# Patient Record
Sex: Female | Born: 1935 | Race: White | Hispanic: No | Marital: Married | Smoking: Former smoker
Health system: Southern US, Community
[De-identification: ages and names within clinical notes are randomized; demographics above are authoritative.]

## PROBLEM LIST (undated history)

## (undated) DIAGNOSIS — K429 Umbilical hernia without obstruction or gangrene: Secondary | ICD-10-CM

## (undated) DIAGNOSIS — M1990 Unspecified osteoarthritis, unspecified site: Secondary | ICD-10-CM

## (undated) DIAGNOSIS — E164 Increased secretion of gastrin: Secondary | ICD-10-CM

## (undated) DIAGNOSIS — Z8719 Personal history of other diseases of the digestive system: Secondary | ICD-10-CM

## (undated) DIAGNOSIS — E7800 Pure hypercholesterolemia, unspecified: Secondary | ICD-10-CM

## (undated) DIAGNOSIS — E039 Hypothyroidism, unspecified: Secondary | ICD-10-CM

## (undated) DIAGNOSIS — F419 Anxiety disorder, unspecified: Secondary | ICD-10-CM

## (undated) DIAGNOSIS — K589 Irritable bowel syndrome without diarrhea: Secondary | ICD-10-CM

## (undated) DIAGNOSIS — E041 Nontoxic single thyroid nodule: Secondary | ICD-10-CM

## (undated) DIAGNOSIS — C50419 Malignant neoplasm of upper-outer quadrant of unspecified female breast: Secondary | ICD-10-CM

## (undated) DIAGNOSIS — M810 Age-related osteoporosis without current pathological fracture: Secondary | ICD-10-CM

## (undated) DIAGNOSIS — E871 Hypo-osmolality and hyponatremia: Secondary | ICD-10-CM

## (undated) DIAGNOSIS — J189 Pneumonia, unspecified organism: Secondary | ICD-10-CM

## (undated) DIAGNOSIS — K56609 Unspecified intestinal obstruction, unspecified as to partial versus complete obstruction: Secondary | ICD-10-CM

## (undated) DIAGNOSIS — I480 Paroxysmal atrial fibrillation: Secondary | ICD-10-CM

## (undated) DIAGNOSIS — K219 Gastro-esophageal reflux disease without esophagitis: Secondary | ICD-10-CM

## (undated) DIAGNOSIS — E109 Type 1 diabetes mellitus without complications: Secondary | ICD-10-CM

## (undated) DIAGNOSIS — M479 Spondylosis, unspecified: Secondary | ICD-10-CM

## (undated) DIAGNOSIS — I10 Essential (primary) hypertension: Secondary | ICD-10-CM

## (undated) HISTORY — PX: BREAST LUMPECTOMY: SHX2

## (undated) HISTORY — DX: Unspecified osteoarthritis, unspecified site: M19.90

## (undated) HISTORY — DX: Personal history of other diseases of the digestive system: Z87.19

## (undated) HISTORY — PX: APPENDECTOMY: SHX54

## (undated) HISTORY — PX: BLADDER REPAIR: SHX76

## (undated) HISTORY — DX: Increased secretion of gastrin: E16.4

## (undated) HISTORY — DX: Nontoxic single thyroid nodule: E04.1

## (undated) HISTORY — DX: Malignant neoplasm of upper-outer quadrant of unspecified female breast: C50.419

## (undated) HISTORY — DX: Irritable bowel syndrome, unspecified: K58.9

## (undated) HISTORY — DX: Pneumonia, unspecified organism: J18.9

## (undated) HISTORY — PX: ABDOMINAL HYSTERECTOMY: SHX81

## (undated) HISTORY — PX: OTHER SURGICAL HISTORY: SHX169

## (undated) HISTORY — DX: Umbilical hernia without obstruction or gangrene: K42.9

## (undated) HISTORY — DX: Hypothyroidism, unspecified: E03.9

## (undated) HISTORY — PX: TONSILLECTOMY: SUR1361

## (undated) HISTORY — PX: BREAST SURGERY: SHX581

## (undated) HISTORY — DX: Pure hypercholesterolemia, unspecified: E78.00

## (undated) HISTORY — DX: Anxiety disorder, unspecified: F41.9

## (undated) HISTORY — DX: Unspecified intestinal obstruction, unspecified as to partial versus complete obstruction: K56.609

## (undated) HISTORY — DX: Type 1 diabetes mellitus without complications: E10.9

## (undated) HISTORY — DX: Age-related osteoporosis without current pathological fracture: M81.0

---

## 1997-11-21 ENCOUNTER — Other Ambulatory Visit: Admission: RE | Admit: 1997-11-21 | Discharge: 1997-11-21 | Payer: Self-pay | Admitting: Gynecology

## 1998-12-24 ENCOUNTER — Other Ambulatory Visit: Admission: RE | Admit: 1998-12-24 | Discharge: 1998-12-24 | Payer: Self-pay | Admitting: Gynecology

## 1999-01-21 ENCOUNTER — Ambulatory Visit (HOSPITAL_COMMUNITY): Admission: RE | Admit: 1999-01-21 | Discharge: 1999-01-21 | Payer: Self-pay

## 1999-12-23 ENCOUNTER — Other Ambulatory Visit: Admission: RE | Admit: 1999-12-23 | Discharge: 1999-12-23 | Payer: Self-pay | Admitting: Gynecology

## 2000-01-26 ENCOUNTER — Encounter: Admission: RE | Admit: 2000-01-26 | Discharge: 2000-01-26 | Payer: Self-pay | Admitting: Gynecology

## 2000-01-26 ENCOUNTER — Encounter: Payer: Self-pay | Admitting: Gynecology

## 2000-02-02 ENCOUNTER — Encounter: Payer: Self-pay | Admitting: Gynecology

## 2000-02-02 ENCOUNTER — Encounter: Admission: RE | Admit: 2000-02-02 | Discharge: 2000-02-02 | Payer: Self-pay | Admitting: Gynecology

## 2001-01-12 ENCOUNTER — Other Ambulatory Visit: Admission: RE | Admit: 2001-01-12 | Discharge: 2001-01-12 | Payer: Self-pay | Admitting: Gynecology

## 2001-02-28 ENCOUNTER — Encounter: Admission: RE | Admit: 2001-02-28 | Discharge: 2001-02-28 | Payer: Self-pay | Admitting: Gynecology

## 2001-02-28 ENCOUNTER — Encounter: Payer: Self-pay | Admitting: Gynecology

## 2002-01-16 ENCOUNTER — Other Ambulatory Visit: Admission: RE | Admit: 2002-01-16 | Discharge: 2002-01-16 | Payer: Self-pay | Admitting: Gynecology

## 2002-03-08 ENCOUNTER — Encounter: Payer: Self-pay | Admitting: Gynecology

## 2002-03-08 ENCOUNTER — Encounter: Admission: RE | Admit: 2002-03-08 | Discharge: 2002-03-08 | Payer: Self-pay | Admitting: Gynecology

## 2003-02-13 ENCOUNTER — Other Ambulatory Visit: Admission: RE | Admit: 2003-02-13 | Discharge: 2003-02-13 | Payer: Self-pay | Admitting: Gynecology

## 2003-04-24 ENCOUNTER — Encounter: Admission: RE | Admit: 2003-04-24 | Discharge: 2003-04-24 | Payer: Self-pay | Admitting: Gynecology

## 2003-04-24 ENCOUNTER — Encounter: Payer: Self-pay | Admitting: Gynecology

## 2003-11-07 ENCOUNTER — Inpatient Hospital Stay (HOSPITAL_COMMUNITY): Admission: RE | Admit: 2003-11-07 | Discharge: 2003-11-08 | Payer: Self-pay | Admitting: Gynecology

## 2004-02-25 ENCOUNTER — Other Ambulatory Visit: Admission: RE | Admit: 2004-02-25 | Discharge: 2004-02-25 | Payer: Self-pay | Admitting: Gynecology

## 2004-04-28 ENCOUNTER — Encounter: Admission: RE | Admit: 2004-04-28 | Discharge: 2004-04-28 | Payer: Self-pay | Admitting: Gynecology

## 2004-05-01 ENCOUNTER — Encounter: Admission: RE | Admit: 2004-05-01 | Discharge: 2004-05-01 | Payer: Self-pay | Admitting: Gynecology

## 2005-05-07 ENCOUNTER — Encounter: Admission: RE | Admit: 2005-05-07 | Discharge: 2005-05-07 | Payer: Self-pay | Admitting: Gynecology

## 2006-03-03 ENCOUNTER — Other Ambulatory Visit: Admission: RE | Admit: 2006-03-03 | Discharge: 2006-03-03 | Payer: Self-pay | Admitting: Gynecology

## 2006-05-17 ENCOUNTER — Encounter: Admission: RE | Admit: 2006-05-17 | Discharge: 2006-05-17 | Payer: Self-pay | Admitting: Gynecology

## 2007-02-23 ENCOUNTER — Ambulatory Visit: Payer: Self-pay | Admitting: Internal Medicine

## 2007-02-23 ENCOUNTER — Inpatient Hospital Stay (HOSPITAL_COMMUNITY): Admission: EM | Admit: 2007-02-23 | Discharge: 2007-02-24 | Payer: Self-pay | Admitting: Emergency Medicine

## 2007-02-23 ENCOUNTER — Encounter (INDEPENDENT_AMBULATORY_CARE_PROVIDER_SITE_OTHER): Payer: Self-pay | Admitting: Internal Medicine

## 2007-02-23 ENCOUNTER — Ambulatory Visit: Payer: Self-pay | Admitting: Cardiology

## 2007-05-30 ENCOUNTER — Encounter: Admission: RE | Admit: 2007-05-30 | Discharge: 2007-05-30 | Payer: Self-pay | Admitting: Gynecology

## 2008-06-18 ENCOUNTER — Encounter: Admission: RE | Admit: 2008-06-18 | Discharge: 2008-06-18 | Payer: Self-pay | Admitting: Gynecology

## 2009-07-01 ENCOUNTER — Encounter: Admission: RE | Admit: 2009-07-01 | Discharge: 2009-07-01 | Payer: Self-pay | Admitting: Gynecology

## 2010-07-14 ENCOUNTER — Encounter: Admission: RE | Admit: 2010-07-14 | Discharge: 2010-07-14 | Payer: Self-pay | Admitting: Gynecology

## 2010-07-17 HISTORY — PX: ESOPHAGOGASTRODUODENOSCOPY: SHX1529

## 2010-07-24 ENCOUNTER — Encounter
Admission: RE | Admit: 2010-07-24 | Discharge: 2010-07-24 | Payer: Self-pay | Source: Home / Self Care | Attending: Gynecology | Admitting: Gynecology

## 2010-08-10 LAB — HM PAP SMEAR: HM Pap smear: NORMAL

## 2010-08-31 ENCOUNTER — Encounter: Payer: Self-pay | Admitting: Gynecology

## 2010-12-23 NOTE — Discharge Summary (Signed)
[name], [name]                ACCOUNT NO.:  [id number]   MEDICAL RECORD NO.:  [id number]          PATIENT TYPE:  INP   LOCATION:  4731                         FACILITY:  [facility]   PHYSICIAN:  Alvester Morin, M.D.  DATE OF BIRTH:  1929-05-03   DATE OF ADMISSION:  02/23/2007  DATE OF DISCHARGE:  02/24/2007                               DISCHARGE SUMMARY   DISCHARGE DIAGNOSES:  1. Chest pain, unknown etiology  2. Atrial fibrillation with rapid ventricular rate, spontaneously      converted  3. Diabetes mellitus type 1.  4. Hypothyroidism.  5. Hyperlipidemia.  6. GI bleed.   MEDICATIONS ON DISCHARGE:  1. Tricor 80 mg PO bid.  2. Insulin Lantus 10 units SQ daily.  3. Sliding scale insulin.  4. Synthroid 0.112 mcg PO bid.  5. Multivitamin.  6. Lunesta 2 mg p.r.n. for sleep.  7. Omeprazole 20 mg PO bid.   DISPOSITION:  She will follow up with cardiologist, Dr. Therisa Doyne,  in [facility] on February 25, 2007, and with her  gastroenterologist, Dr. Alycia Rossetti, in [facility] on March 09, 2007  for her new onset of GI bleed. Her cardiologist will follow her for the  arrythmia.   PROCEDURES:  1. CT angio of the chest and abdomen.  Chest negative for aortic      dissection or pulmonary embolus, and borderline mediastinal lymph      nodes.  Abdomen had no evidence of aortic dissection.   CONSULTATIONS:  None.   HISTORY OF PRESENT ILLNESS:  Ms Bertoni is a 77 year old old lady with a  past medical history of type 1 diabetes mellitus, hyperlipidemia,  history of palpitations, non-smoker, non-obese, and nonhypertensive,  came with sudden chest pain that awakened her from sleep.  The pain was  5/10 in intensity, pressure-like and radiated to the back.  No  aggravating or relieving factor.  No nausea or vomiting, but some  palpitation present.  No indigestion or acid reflux.  No dizziness.  No  sweating.  The only other significant history was an episode of  hypoglycemia with blood glucose of 45 the night prior to admission.  In  the ED, she was found to be in atrial fibrillation with rapid  ventricular rate with a maximum heart rate of 130 spontaneously  converted to sinus rhythm in the ED.   EXAMINATION:  VITALS:  Temperature 98.5, blood pressure right arm 121/35, left arm  149/73, pulse 131, respirations 35, saturation 100 on room air.  GENERAL EXAM:  No distress.  EYES:  PERRLA.  Extraocular muscle movement intact.  OROPHARYNX:  Moist.  No masses.  No JVD.  No bruits.  CHEST:  Clear to auscultation bilaterally.  No rales or wheezes.  Good  air entry.  No chest tenderness.  HEART:  First and second heart sounds normal.  Regular rate and rhythm.  No rubs or gallops.  Pulses good volume in all 4 limbs.  ABDOMEN:  Bowels are normal. Soft. Nontender.  No organomegaly.  EXTREMITIES:  No edema, cyanosis, or clubbing.  NEUROLOGICAL:  Grossly normal.  LABS ON ADMISSION:  Cardiac enzymes, 3 sets all normal.  WBC 4.5,  hemoglobin 14.4, platelets 187,000.  Sodium 146, potassium 4.1, chloride  104, bicarb 26.3, BUN 13, creatinine 0.8, glucose 164, bili 0.6, alk  phos 72, SGOT 30, SGPT 20, protein 6.3, albumin 3.2.  EKG normal rate  and normal sinus rhythm.  D-dimer 0.26.  Lipase 11, magnesium 1.9.  Urinalysis within normal limits.  Hemoglobin A1c 6.7.   HOSPITAL COURSE:  1. Chest pain:  Serial EKG's showed no new changes and serial cardiac      enzymes and troponins were WNL. She had no more episodes of chest      pain.  2. Atrial fibrillation:  She spontaneously converted to sinus rhythm      in the ED. We followed her rhythm in telemetry. She had some      bradycardic episodes with the lowest being 48 but was asymptomatic.      This may be due to Beta bolcker or may be sick sinus syndrome.  3. Diabetes mellitus type 1:  During hospitalization, she had 1      episode of hypoglycemia when her glucose dropped to 35.  Therefore,      her Lantus  was decreased to 3 units a day, and she had no more      episodes of hypoglycemia.  4. Hypothyroidism:  Her TSH was within normal limits.  5. Hyperlipidemia:  We continued statin.   DISCHARGE LABS:  WBC 5.8, hemoglobin 11.7, MCV 91.6, platelets 154,000.  Sodium 136, potassium 4.2, glucose 217, BUN 12, creatinine 0.65,  bilirubin 0.5, alk phos 72, AST 34, ALT 19, total protein 5.8, albumin  6.2, and calcium 9.5.  Last set of cardiac enzymes normal and blood  lipase 19.   VITALS ON DISCHARGE:  Temperature 98, blood pressure 125/75, pulse 80,  respirations 22, oxygen saturations 98 on room air.   CONDITION ON DISCHARGE:  Stable      Jason Coop, MD  Electronically Signed      Alvester Morin, M.D.  Electronically Signed    YP/MEDQ  D:  02/24/2007  T:  02/25/2007  Job:  [id number]   cc:   Therisa Doyne

## 2010-12-26 NOTE — H&P (Signed)
NAME:  [name], [name]                          ACCOUNT NO.:  [id number]   MEDICAL RECORD NO.:  [id number]                   PATIENT TYPE:  INP   LOCATION:  NA                                   FACILITY:  [facility]   PHYSICIAN:  Gretta Cool, M.D.              DATE OF BIRTH:  February 26, 1929   DATE OF ADMISSION:  11/07/2003  DATE OF DISCHARGE:                                HISTORY & PHYSICAL   CHIEF COMPLAINT:  Pelvic support problems.   HISTORY OF PRESENT ILLNESS:  A 77 year old, G6, P3, AB3, with three living  children with history of hysterectomy in [place]. At that procedure she  had multiple complications including ligation of ureters. She also has a  history of bilateral salpingo-oophorectomy  subsequently. She now has severe  pelvic support issues with global relaxation. She has vault descensus and  severe particularly posterior pelvic support problems with detachment of the  perirectal fascia with levator diastasis. She has minor descent of the  anterior vaginal wall, an enormous enterocele and rectocele, and loss of  peritoneal body musculature.  She has had a trial of pessary with a barrier  test and was not incontinent with the pessary in place. She does not  tolerate the pessary, however, and wishes surgical repair. She is using  vaginal estrogen to improve the friability of her vagina. She is now  admitted for definitive therapy.  She has a history of premature atrial  contractions and a recent history of chest pain. She has had a complete  evaluation done at [facility] with stress test times two, with  echocardiogram. She has no evidence of active coronary disease. She has  known carotid artery calcification. She is now on a beta blocker for  cardioprotective effect.   PAST MEDICAL HISTORY:  Usual childhood disease without sequelae. Medical  illness includes hypothyroidism on thyroid replacement. There is a history  of premature atrial contractions and chest  pains. See the recent evaluation  at [facility].   PAST SURGICAL HISTORY:  Abdominal hysterectomy in 1972/12/29 in [place] with  ureteral injury with subsequent reimplantation of ureters, bilateral  salpingo-oophorectomy in Dec 30, 1974 also in [place], breast biopsy times four.   FAMILY HISTORY:  Father died age 87 with massive MI. Mother died in Dec 30, 1987 of  stroke and fractured hip. Two sisters living and well. One brother deceased  with Parkinson disease.   PRESENT MEDICATIONS:  1. Atenolol 25 mg daily.  2. Synthroid 0.88 mg daily.  3. Zocor 10 mg daily.  4. Xanax 0.5 p.r.n. anxiety.   ALLERGIES:  ERYTHROMYCIN and AUGMENTIN.   SOCIAL HISTORY:  The patient is a retired Engineer, civil (consulting). Her husband is a retired  Runner, broadcasting/film/video. They live in their home in [place].   PHYSICAL EXAMINATION:  GENERAL: A well-developed, well-nourished thin white  female.  HEENT:  Pupils equal, react to light and accommodation. Fundi not examined.  Oropharynx is  clear.  NECK: Supple without mass or thyroid enlargement.  CHEST: Clear to auscultation and percussion.  HEART: Regular rhythm without murmur or cardiac enlargement.  BREASTS: Without mass, nodes, or nipple discharge.  ABDOMEN: Soft without mass or organomegaly.  PELVIC EXAM: External genitalia reveals normal female. Vagina clean without  liquid estrogen effect. Her lower vagina is reasonably well preserved. She  has severe level I and II deficits with detachment of the perirectal fascia  posteriorly with grade 3 rectocele and enterocele. She has a small central  fascia weakness and vaginal vault descensus. Rectovaginal exam confirms.  Cervix, uterus, and adnexa surgically absent.  EXTREMITIES: Negative.  NEUROLOGIC: Physiologic.   IMPRESSION:  1. Global pelvic support weakness with grade 3 rectocele and enterocele with     vaginal vault descensus, small anterior cystocele.  2. Hypothyroid, on replacement.  3. Dyslipidemia, on Zocor.  4. History of  chest pain with negative evaluation.  5. Carotid calcifications bilaterally.   PLAN:  Anterior, posterior, and enterocele repairs with vault suspension.                                               Gretta Cool, M.D.    CWL/MEDQ  D:  11/06/2003  T:  11/06/2003  Job:  [id number]   cc:   Dr. Shanon Rosser, [facility]   Redge Gainer, MD  [facility]. [facility]

## 2010-12-26 NOTE — Discharge Summary (Signed)
NAME:  [name], [name]                          ACCOUNT NO.:  [id number]   MEDICAL RECORD NO.:  [id number]                   PATIENT TYPE:  INP   LOCATION:  0448                                 FACILITY:  [facility]   PHYSICIAN:  [name], M.D.              DATE OF BIRTH:  [birth year]   DATE OF ADMISSION:  11/07/2003  DATE OF DISCHARGE:  11/08/2003                                 DISCHARGE SUMMARY   HISTORY OF PRESENT ILLNESS:  [name] is a 77 year old female, gravida 6,  para 3, AB 3, with a history of hysterectomy in [place].  At that time  she had multiple complications including ligation of ureters.  She has a  history of bilateral salpingo-oophorectomy subsequently.  She now presents  with severe pelvic support relaxation.  Upon examination, it is noted that  she has bulk descensus and severe posterior pelvic support with detachment  of the perirectal fascia and levator diastasis.  She has minor descent of  the anterior vaginal wall with an enormous rectocele, enterocele, and loss  of peritoneal body musculature.  She has had a trial of Pessary and was not  incontinent with the Pessary in place, but does not tolerate it well and  wishes to proceed with surgery repair.  She is using vaginal Estrogen to  improve the friability of her tissue.  She is now admitted for definitive  repairs.  It is also noted that she has premature atrial contractions, and  recent history of chest pain.  She has had a complete evaluation done at  [facility] with stress test x2 and echocardiogram.  There is no  evidence of active coronary disease.  She does have a known carotid artery  calcification.  She is on beta blockers for cardio-protective effect.   PHYSICAL EXAMINATION:  CHEST:  Clear to A&P.  HEART:  Regular rate and rhythm without murmur, gallop, or cardiac  enlargement.  ABDOMEN:  Soft without masses, organomegaly.  PELVIC:  External genitalia within normal limits for  female.  Vagina is  clean with adequate estrogen effect.  The lower vagina is reasonably well  preserved.  She has severe level one and two deficits with detachment of the  perirectal fascia posteriorly with grade 3 enterocele and rectocele.  She  has a small central fascial weakness and vaginal vault descensus.  Rectovaginal examination confirms.  Cervix and uterus is surgically absent.   IMPRESSION:  1. Global pelvic weakness with grade 3 rectocele and enterocele with vaginal     vault descent, small anterior cystocele.  2. Hypothyroid, on replacement.  3. Dyslipidemia, on Zocor.  4. History of chest pain with negative evaluation.  5. Carotid calcification bilaterally.   PLAN:  Anterior posterior enterocele repairs with vaginal vault suspension  under general anesthesia.  The procedure was discussed with the patient and  she accepts this treatment.   LABORATORY DATA:  Admission hemoglobin  14.2, hematocrit 41.1.  On the first  postoperative day, hemoglobin was 10.9, hematocrit 32.2.  Glucose on  admission was high at 144.  EKG normal sinus rhythm, septal infarct, age  undetermined, considered an abnormal EKG.  Chest x-ray moderate changes of  COPD and chronic bronchitis, bilateral carotid artery calcifications.   HOSPITAL COURSE:  The patient underwent anterior posterior enterocele  repairs and colposuspension under general anesthesia.  The procedures were  completed without any complications, and the patient was returned to the  recovery room in excellent condition.  Her postoperative course was without  complications, and she was discharged on the first postoperative day in  excellent condition.  Postoperative instructions were given.  No heavy  lifting or straining, no vaginal entrance, increase ambulation as tolerated.  She is to call with any fever of over 100.5 or failure of daily improvement.   DIET:  Regular.   DISCHARGE MEDICATIONS:  Were given to the patient by Dr.  Nicholas Foster for Dilaudid  and Phenergan.   FOLLOWUP:  She is to return to the office in one week for followup.   CONDITION ON DISCHARGE:  Excellent.   FINAL DISCHARGE DIAGNOSIS:  Severe pelvic organ prolapse.   PROCEDURES PERFORMED:  Anterior and posterior enterocele repairs and  colposuspension under general anesthesia.     [name], N.P.                          [name], M.D.    EMK/MEDQ  D:  11/26/2003  T:  11/26/2003  Job:  [id number]   cc:   [name], [place]   [name]  [name]

## 2010-12-26 NOTE — Op Note (Signed)
[name], [name]                          ACCOUNT NO.:  [id number]   MEDICAL RECORD NO.:  [id number]                   PATIENT TYPE:  INP   LOCATION:  0448                                 FACILITY:  [facility]   PHYSICIAN:  Gretta Cool, M.D.              DATE OF BIRTH:  [birth year]   DATE OF PROCEDURE:  11/07/2003  DATE OF DISCHARGE:                                 OPERATIVE REPORT   PREOPERATIVE DIAGNOSIS:  Severe pelvic organ prolapse.   POSTOPERATIVE DIAGNOSIS:  Severe pelvic organ prolapse.   PROCEDURE:  Anterior, posterior and enterocele repairs and colposuspension.   SURGEON:  Gretta Cool, M.D.   ASSISTANT:  Raynald Kemp, M.D.   ANESTHESIA:  General orotracheal.   DESCRIPTION OF PROCEDURE:  Under excellent anesthesia as above with the  patient prepped and draped in the lithotomy position, the vaginal apex was  grasped with Allis clamps and an incision made across the apex of the  fundus.  The mucosa was then dissected from the underlying endocervical  fascia.  The dissection was carried to the sub-urethral area and the mucosa  then reflected by blunt and sharp dissection from the underlying endopelvic  fascia.  A large central fascial defect could be identified with resultant  bulge of the bladder base through the defect.  The defect was initially  closed by pursestring sutures and then with mattress closure of 2-0 Vicryl.  Kelly plication sutures were then placed mid urethra to elevate the vesical  neck.  The bladder base was then secured to the uterosacral cardinal complex  so as to fix the anterior vaginal wall fascia to a high intra-abdominal  position. At this point the mucosa was trimmed and closed with a running  subcuticular closure of 2-0 Vicryl.  At this point, the posterior vaginal  wall was grasped with Allis clamps and infiltrated with Xylocaine with  epinephrine.  The mucosa was then incised and the mucosa dissected to the  apex of the vaginal  cuff.  The mucosa was then reflected off the perirectal  fascia.  The large enterocele defect was identified and plicated.  The  uterosacral cardinal complex was then identified and the vaginal apex  secured to the cardinal uterosacral complex by interrupted sutures of #2-0  Monocryl.  The perirectal fascia was then secured to the cardinal  uterosacral complex with interrupted sutures of 2-0 Novofil.  The upper  portion of the endopelvic fascia and perirectal fascia was then plicated in  the midline so as to complete an envelope of fascia 360 degrees around the  apex of the cuff.  Those sutures were also secured to the uterosacral  cardinal complex.  At this point the remaining defects in the perirectal  fascia were approximated with interrupted running sutures of  #2-0 Vicryl.  The fascial detachment was reapproximated totally at this point.  The  perineal body muscle were then dissected free  and plicated in the midline.  The mucosa was then trimmed and closed with a running 2-0 Vicryl suture  along with the upper layers of endopelvic fascia.  At this point, the  perineal body musculature was completely rebuilt and the mucosa closed with  a subcuticular closure of 2-0 Vicryl.  At this point the bladder was filled  with lactated Ringers and a banana suprapubic Cysto cath placed and secured  with 3-0  nylon.  At this point, the vagina was well supported by digital introitus  with adequate caliber and adequate length.  The procedure was terminated  without complications and the patient returned to the recovery room in  excellent condition.                                               Gretta Cool, M.D.    CWL/MEDQ  D:  11/07/2003  T:  11/07/2003  Job:  [id number]   cc:   DR. Shanon Rosser, [place]   Redge Gainer, M.D.  [facility]  [facility]

## 2011-05-25 LAB — CARDIAC PANEL(CRET KIN+CKTOT+MB+TROPI)
CK, MB: 3
Relative Index: INVALID
Troponin I: 0.01
Troponin I: 0.03

## 2011-05-25 LAB — COMPREHENSIVE METABOLIC PANEL
ALT: 20
AST: 30
Albumin: 3.2 — ABNORMAL LOW
Albumin: 3.4 — ABNORMAL LOW
Alkaline Phosphatase: 72
BUN: 12
Calcium: 9.6
Chloride: 103
Creatinine, Ser: 0.65
GFR calc Af Amer: >60
GFR calc non Af Amer: >60
Glucose, Bld: 170 — ABNORMAL HIGH
Glucose, Bld: 270 — ABNORMAL HIGH
Potassium: 4.4
Sodium: 138
Total Bilirubin: 0.5
Total Protein: 6.3

## 2011-05-25 LAB — LIPASE, BLOOD
Lipase: 17
Lipase: 19

## 2011-05-25 LAB — CBC
HCT: 34.1 — ABNORMAL LOW
HCT: 41.3
Hemoglobin: 13.3
Hemoglobin: 14.4
MCHC: 35.1
MCV: 90
MCV: 90.2
MCV: 91.6
Platelets: 154
RBC: 4.21
RBC: 4.57
RDW: 12.4
WBC: 4.5
WBC: 5.8

## 2011-05-25 LAB — I-STAT 8, (EC8 V) (CONVERTED LAB)
Acid-Base Excess: 5 — ABNORMAL HIGH
Bicarbonate: 26.3 — ABNORMAL HIGH
Hemoglobin: 15.3 — ABNORMAL HIGH
Potassium: 4.1
Sodium: 136
TCO2: 27
pH, Ven: 7.572 — ABNORMAL HIGH

## 2011-05-25 LAB — DIFFERENTIAL
Basophils Relative: 1
Eosinophils Absolute: 0
Eosinophils Absolute: 0.1
Eosinophils Relative: 1
Eosinophils Relative: 2
Lymphocytes Relative: 41
Lymphs Abs: 1.9
Monocytes Absolute: 0.6
Monocytes Relative: 14 — ABNORMAL HIGH
Monocytes Relative: 14 — ABNORMAL HIGH

## 2011-05-25 LAB — URINALYSIS, ROUTINE W REFLEX MICROSCOPIC
Glucose, UA: NEGATIVE
Hgb urine dipstick: NEGATIVE
Specific Gravity, Urine: 1.008

## 2011-05-25 LAB — HEMOGLOBIN A1C: Hgb A1c MFr Bld: 6.7 — ABNORMAL HIGH

## 2011-05-25 LAB — POCT CARDIAC MARKERS
CKMB, poc: 2
CKMB, poc: 3.3
Myoglobin, poc: 50.2
Troponin i, poc: <0.05
Troponin i, poc: <0.05

## 2011-06-16 LAB — HEPATIC FUNCTION PANEL
Alkaline Phosphatase: 83 U/L (ref 25–125)
Bilirubin, Direct: 0.1 mg/dL (ref 0.01–0.4)

## 2011-06-16 LAB — LIPID PANEL
Cholesterol: 179 mg/dL (ref 0–200)
HDL: 64 mg/dL (ref 35–70)
LDL Cholesterol: 104 mg/dL
Triglycerides: 54 mg/dL (ref 40–160)

## 2011-06-16 LAB — BASIC METABOLIC PANEL: BUN: 9 mg/dL (ref 4–21)

## 2011-06-16 LAB — POCT ERYTHROCYTE SEDIMENTATION RATE, NON-AUTOMATED

## 2011-06-22 ENCOUNTER — Other Ambulatory Visit: Payer: Self-pay | Admitting: Gynecology

## 2011-06-22 DIAGNOSIS — R234 Changes in skin texture: Secondary | ICD-10-CM

## 2011-06-29 ENCOUNTER — Ambulatory Visit
Admission: RE | Admit: 2011-06-29 | Discharge: 2011-06-29 | Disposition: A | Discharge status: Home / Self Care | Payer: Medicare Other | Source: Ambulatory Visit | Attending: Gynecology | Admitting: Gynecology

## 2011-06-29 DIAGNOSIS — R234 Changes in skin texture: Secondary | ICD-10-CM

## 2011-07-06 ENCOUNTER — Other Ambulatory Visit: Payer: Self-pay | Admitting: Gynecology

## 2011-07-06 DIAGNOSIS — Z1231 Encounter for screening mammogram for malignant neoplasm of breast: Secondary | ICD-10-CM

## 2011-07-06 DIAGNOSIS — N6489 Other specified disorders of breast: Secondary | ICD-10-CM

## 2011-07-06 DIAGNOSIS — N6453 Retraction of nipple: Secondary | ICD-10-CM

## 2011-07-17 ENCOUNTER — Other Ambulatory Visit: Payer: Medicare Other

## 2011-07-17 ENCOUNTER — Ambulatory Visit: Payer: Medicare Other

## 2011-07-28 ENCOUNTER — Ambulatory Visit
Admission: RE | Admit: 2011-07-28 | Discharge: 2011-07-28 | Disposition: A | Discharge status: Home / Self Care | Payer: Medicare Other | Source: Ambulatory Visit | Attending: Gynecology | Admitting: Gynecology

## 2011-07-28 DIAGNOSIS — N6453 Retraction of nipple: Secondary | ICD-10-CM

## 2011-07-28 DIAGNOSIS — Z1231 Encounter for screening mammogram for malignant neoplasm of breast: Secondary | ICD-10-CM

## 2011-07-28 MED ORDER — GADOBENATE DIMEGLUMINE 529 MG/ML IV SOLN
12.0000 mL | Freq: Once | INTRAVENOUS | Status: AC | PRN
  Administered 2011-07-28: 12 mL via INTRAVENOUS

## 2011-08-05 ENCOUNTER — Other Ambulatory Visit: Payer: Self-pay | Admitting: Gynecology

## 2011-08-05 DIAGNOSIS — R928 Other abnormal and inconclusive findings on diagnostic imaging of breast: Secondary | ICD-10-CM

## 2011-08-10 ENCOUNTER — Other Ambulatory Visit: Payer: Self-pay | Admitting: Gynecology

## 2011-08-10 DIAGNOSIS — R911 Solitary pulmonary nodule: Secondary | ICD-10-CM

## 2011-08-13 ENCOUNTER — Ambulatory Visit
Admission: RE | Admit: 2011-08-13 | Discharge: 2011-08-13 | Disposition: A | Discharge status: Home / Self Care | Payer: Medicare Other | Source: Ambulatory Visit | Attending: Gynecology | Admitting: Gynecology

## 2011-08-13 DIAGNOSIS — R911 Solitary pulmonary nodule: Secondary | ICD-10-CM

## 2011-08-13 MED ORDER — IOHEXOL 300 MG/ML  SOLN
75.0000 mL | Freq: Once | INTRAMUSCULAR | Status: AC | PRN
  Administered 2011-08-13: 75 mL via INTRAVENOUS

## 2011-08-17 ENCOUNTER — Ambulatory Visit
Admission: RE | Admit: 2011-08-17 | Discharge: 2011-08-17 | Disposition: A | Discharge status: Home / Self Care | Payer: Medicare Other | Source: Ambulatory Visit | Attending: Gynecology | Admitting: Gynecology

## 2011-08-17 DIAGNOSIS — R928 Other abnormal and inconclusive findings on diagnostic imaging of breast: Secondary | ICD-10-CM

## 2011-08-17 MED ORDER — GADOBENATE DIMEGLUMINE 529 MG/ML IV SOLN
12.0000 mL | Freq: Once | INTRAVENOUS | Status: AC | PRN
  Administered 2011-08-17: 12 mL via INTRAVENOUS

## 2011-08-21 ENCOUNTER — Other Ambulatory Visit: Payer: Self-pay | Admitting: *Deleted

## 2011-08-21 ENCOUNTER — Telehealth: Payer: Self-pay | Admitting: *Deleted

## 2011-08-21 DIAGNOSIS — C50419 Malignant neoplasm of upper-outer quadrant of unspecified female breast: Secondary | ICD-10-CM

## 2011-08-21 DIAGNOSIS — C50412 Malignant neoplasm of upper-outer quadrant of left female breast: Secondary | ICD-10-CM | POA: Insufficient documentation

## 2011-08-21 HISTORY — DX: Malignant neoplasm of upper-outer quadrant of unspecified female breast: C50.419

## 2011-08-21 NOTE — Telephone Encounter (Signed)
Confirmed [facility] for 08/26/11 at 1215 .  Instructions and contact information given.  

## 2011-08-26 ENCOUNTER — Ambulatory Visit (HOSPITAL_BASED_OUTPATIENT_CLINIC_OR_DEPARTMENT_OTHER): Payer: Medicare Other | Admitting: General Surgery

## 2011-08-26 ENCOUNTER — Other Ambulatory Visit: Payer: Medicare Other | Admitting: Lab

## 2011-08-26 ENCOUNTER — Other Ambulatory Visit (INDEPENDENT_AMBULATORY_CARE_PROVIDER_SITE_OTHER): Payer: Self-pay | Admitting: General Surgery

## 2011-08-26 ENCOUNTER — Ambulatory Visit
Admission: RE | Admit: 2011-08-26 | Discharge: 2011-08-26 | Disposition: A | Discharge status: Home / Self Care | Payer: Medicare Other | Source: Ambulatory Visit | Attending: Radiation Oncology | Admitting: Radiation Oncology

## 2011-08-26 ENCOUNTER — Encounter: Payer: Self-pay | Admitting: Oncology

## 2011-08-26 ENCOUNTER — Ambulatory Visit: Payer: Medicare Other | Attending: General Surgery | Admitting: Physical Therapy

## 2011-08-26 ENCOUNTER — Ambulatory Visit: Payer: Medicare Other

## 2011-08-26 ENCOUNTER — Encounter (INDEPENDENT_AMBULATORY_CARE_PROVIDER_SITE_OTHER): Payer: Self-pay | Admitting: General Surgery

## 2011-08-26 ENCOUNTER — Ambulatory Visit (HOSPITAL_BASED_OUTPATIENT_CLINIC_OR_DEPARTMENT_OTHER): Payer: Medicare Other | Admitting: Oncology

## 2011-08-26 VITALS — BP 184/68 | HR 80 | Temp 98.4°F | Ht 67.0 in | Wt 143.7 lb

## 2011-08-26 DIAGNOSIS — C50119 Malignant neoplasm of central portion of unspecified female breast: Secondary | ICD-10-CM

## 2011-08-26 DIAGNOSIS — C50919 Malignant neoplasm of unspecified site of unspecified female breast: Secondary | ICD-10-CM | POA: Insufficient documentation

## 2011-08-26 DIAGNOSIS — C50419 Malignant neoplasm of upper-outer quadrant of unspecified female breast: Secondary | ICD-10-CM

## 2011-08-26 DIAGNOSIS — M25619 Stiffness of unspecified shoulder, not elsewhere classified: Secondary | ICD-10-CM | POA: Insufficient documentation

## 2011-08-26 DIAGNOSIS — IMO0001 Reserved for inherently not codable concepts without codable children: Secondary | ICD-10-CM | POA: Insufficient documentation

## 2011-08-26 DIAGNOSIS — Z170 Estrogen receptor positive status [ER+]: Secondary | ICD-10-CM

## 2011-08-26 DIAGNOSIS — Z01818 Encounter for other preprocedural examination: Secondary | ICD-10-CM | POA: Insufficient documentation

## 2011-08-26 LAB — CBC WITH DIFFERENTIAL/PLATELET
Eosinophils Absolute: 0.1 10*3/uL (ref 0.0–0.5)
HGB: 12.7 g/dL (ref 11.6–15.9)
MONO#: 0.6 10*3/uL (ref 0.1–0.9)
NEUT#: 3.5 10*3/uL (ref 1.5–6.5)
Platelets: 218 10*3/uL (ref 145–400)
RBC: 4.25 10*6/uL (ref 3.70–5.45)
RDW: 13.6 % (ref 11.2–14.5)
WBC: 5.9 10*3/uL (ref 3.9–10.3)

## 2011-08-26 LAB — COMPREHENSIVE METABOLIC PANEL
Albumin: 3.9 g/dL (ref 3.5–5.2)
CO2: 28 mEq/L (ref 19–32)
Calcium: 10.1 mg/dL (ref 8.4–10.5)
Glucose, Bld: 146 mg/dL — ABNORMAL HIGH (ref 70–99)
Potassium: 4.2 mEq/L (ref 3.5–5.3)
Sodium: 133 mEq/L — ABNORMAL LOW (ref 135–145)
Total Protein: 7.7 g/dL (ref 6.0–8.3)

## 2011-08-26 NOTE — Progress Notes (Signed)
CC:   [name], M.D. [name], M.D. Cheryl Gosling, MD  REFERRING PHYSICIAN:  Gretta Foster, M.D.  DIAGNOSIS:  Invasive mammary carcinoma of Cheryl left breast.  HISTORY OF PRESENT ILLNESS:  [name] is a very pleasant, 78 year old female who is seen out of Cheryl courtesy of Dr. Nicholas Lose as part of Cheryl multidisciplinary breast clinic.  [name], late last year, notice some changes in Cheryl nipple area.  She subsequently saw Dr. Nicholas Lose, who aspirated a cyst.  [name]'s nipple retraction persisted and [name] proceeded to undergo diagnostic left mammogram and left breast ultrasound.  On ultrasound, [name] noted to have a 1.7 x 0.9 x 1.9 cm cyst within Cheryl 12 o'clock position of Cheryl left breast approximately 2 cm from Cheryl nipple area.  On mammography, [name] was noted to have a circumscribed mass within Cheryl central left breast without distortion or suspicious calcifications.  In light of Cheryl nipple retraction, it was recommend that [name] proceed with MRI which showed a 1 x 2 x 1 cm nonmass-like enhancement within Cheryl anteromedial aspect of Cheryl left breast.  This was underlying Cheryl area of Cheryl skin dimpling/nipple retraction area.  There were no other changes noted within either breast or in Cheryl axillary region.  [name] subsequently proceeded to undergo a biopsy of this area which revealed invasive mammary carcinoma.  Cheryl tumor was estrogen receptor-positive at 97% and progesterone receptor-positive at 35%.  Proliferation marker was low at 9%.  There was no HER-2/neu amplification noted.  In light of Cheryl above findings, [name] is now seen as part of Cheryl multidisciplinary breast clinic.  CURRENT MEDICATIONS: 1. Levothyroxine 75 mcg daily. 2. Simvastatin 20 mg daily. 3. Hydrochlorothiazide 12.5 mg daily. 4. [name] is also on multivitamin mineral supplement as well as     citrate and vitamin D3.  MEDICAL HISTORY:  Significant for: 1.  Hypothyroidism. 2. History of hypercholesterolemia. 3. [name] wears an insulin pump for type 1 diabetes mellitus.  PAST SURGICAL HISTORY:  [name]'s surgeries include breast biopsies as well as complete hysterectomy in Cheryl past.  [name] has also had an appendectomy as well as bladder/pelvic floor repair.  SOCIAL HISTORY:  [name] is married and lives with her husband in Cheryl [place] area.  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  [name] has a remote history of tobacco use, quitting smoking approximately 32 years ago.  [name] has occasional alcohol intake.  FAMILY HISTORY:  No family history of breast or ovarian cancer.  [name]'s younger sister had some type of malignancy which was advanced at diagnosis.  REVIEW OF SYSTEMS:  As above, [name] presented with nipple retraction.  She denied any nipple discharge or bleeding.  [name] denies any pain in Cheryl breast prior to diagnosis.  PHYSICAL EXAMINATION:  General Appearance:  This is a very pleasant, 62- year-old female in no acute distress.  She is accompanied by her husband on evaluation today.  Examination of Cheryl neck and supraclavicular region:  No evidence of adenopathy.  Cheryl axillary areas are free of adenopathy.  Examination of Cheryl lungs:  Clear.  Heart:  Regular rhythm and rate.  Examination of Cheryl abdomen:  Soft and nontender with normal bowel sounds.  Examination of Cheryl right breast:  No mass or nipple discharge.  Examination of left breast:  Some bruising centrally within Cheryl breast.  There is apparent hematoma in Cheryl periareolar area at approximately Cheryl  9 o'clock position of Cheryl left breast.  This is estimated to be approximately 2 x 2 cm in size.  There is no obvious dominant mass appreciated in Cheryl breast, nipple discharge, or bleeding. There is some nipple retraction noted when compared to Cheryl right side.  X-RAY STUDIES:  As summarized in Cheryl HPI.  LABORATORY DATA:   Pending.  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 Given Cheryl location of [name]'s tumor, she would require removal of Cheryl nipple-areolar complex for adequate surgical margins.  [name] was offered a potential mastectomy, but [name] would like to keep her breast if at all possible.  Options in Cheryl postoperative setting would be to proceed with adjuvant hormonal therapy, possibly radiation therapy, or a combination of Cheryl two if [name] wished to be very aggressive with her management.  At this point, [name] is undecided which treatment approach she would like to proceed with.  She will be seen in Cheryl postoperative setting before a final determination as to her postoperative course of treatment.    ______________________________ Billie Lade, Ph.D., M.D. JDK/MEDQ  D:  08/26/2011  T:  08/26/2011  Job:  [id number]

## 2011-08-26 NOTE — Assessment & Plan Note (Deleted)
ER

## 2011-08-26 NOTE — Progress Notes (Signed)
Patient ID: [name], female   DOB: 05/08/1929, 78 y.o.   MRN: [id number]  Chief Complaint  Patient presents with  . Other    breast cancer    HPI [name] is a 78 y.o. female.  Referred by Dr. Jeff Hu HPI This is an 78-year-old female who noticed an inverted left nipple. She saw her primary physician Dr. Lomax and had a cyst aspirated that appeared normal. This area persisted and she was referred to the breast center. She underwent a diagnostic left mammogram and ultrasound with the finding of nipple retraction. Following this she underwent an MRI that showed a 1 x 2 x 1 cm non-masslike enhancement within the anterior medial left breast just below her nipple.  There was also multiple right pulmonary nodular opacities. This has since been followed by a chest CT which shows that the right direction decreased in size since 2008. These were recommended follow-up in one year. She then underwent MR biopsy of this subareolar lesion on the left side. Her pathology shows this to be an invasive lobular carcinoma that is estrogen receptor positive and 97%, progesterone receptor positive at 35%, proliferation index is 9% HER-2/neu is not amplified. She comes in today to discuss options. She does not have any other complaints outside of the inversion of her left nipple areolar complex.  Past Medical History  Diagnosis Date  . Thyroid disease     hypothyroidism  . Hypercholesteremia   . Diabetes mellitus     type I- wears insulin pump    Past Surgical History  Procedure Date  . Appendectomy   . Abdominal hysterectomy   . Bladder repair     Family History  Problem Relation Age of Onset  . Cancer Sister     Social History History  Substance Use Topics  . Smoking status: Never Smoker   . Smokeless tobacco: Not on file  . Alcohol Use: No    No Known Allergies  Current Outpatient Prescriptions  Medication Sig Dispense Refill  . calcium citrate (CALCITRATE - DOSED IN MG  ELEMENTAL CALCIUM) 950 MG tablet Take 1 tablet by mouth daily.      . calcium citrate-vitamin D (CITRACAL+D) 315-200 MG-UNIT per tablet Take 1 tablet by mouth 2 (two) times daily.      . hydrochlorothiazide (MICROZIDE) 12.5 MG capsule Take 12.5 mg by mouth daily.      . insulin aspart (NOVOLOG) 100 UNIT/ML injection Inject into the skin 3 (three) times daily before meals.      . levothyroxine (SYNTHROID, LEVOTHROID) 75 MCG tablet Take 75 mcg by mouth daily.      . Multiple Vitamin (MULTIVITAMIN) capsule Take 1 capsule by mouth daily.      . simvastatin (ZOCOR) 20 MG tablet Take 20 mg by mouth every evening.        Review of Systems Review of Systems  Constitutional: Negative for fever, chills and unexpected weight change.  HENT: Positive for sore throat. Negative for hearing loss, congestion, trouble swallowing and voice change.   Eyes: Negative for visual disturbance.  Respiratory: Positive for cough. Negative for wheezing.   Cardiovascular: Positive for palpitations. Negative for chest pain and leg swelling.  Gastrointestinal: Negative for nausea, vomiting, abdominal pain, diarrhea, constipation, blood in stool, abdominal distention and anal bleeding.  Genitourinary: Negative for hematuria, vaginal bleeding and difficulty urinating.  Musculoskeletal: Positive for arthralgias.  Skin: Negative for rash and wound.  Neurological: Negative for seizures, syncope and headaches.  Hematological: Negative   for adenopathy. Bruises/bleeds easily.  Psychiatric/Behavioral: Negative for confusion. The patient is nervous/anxious.     Wt Readings from Last 3 Encounters:  08/26/11 143 lb 11.2 oz (65.182 kg)   Temp Readings from Last 3 Encounters:  08/26/11 98.4 F (36.9 C) Oral   BP Readings from Last 3 Encounters:  08/26/11 184/68   Pulse Readings from Last 3 Encounters:  08/26/11 80    Physical Exam Physical Exam  Constitutional: She appears well-developed and well-nourished.  Eyes: No  scleral icterus.  Neck: Neck supple.  Cardiovascular: Normal rate, regular rhythm and normal heart sounds.   Pulmonary/Chest: Effort normal and breath sounds normal. She has no wheezes. She has no rales. Right breast exhibits no inverted nipple, no mass, no nipple discharge, no skin change and no tenderness. Left breast exhibits inverted nipple (inferior nipple areolar complext inverted). Left breast exhibits no mass, no nipple discharge, no skin change and no tenderness. Breasts are symmetrical.  Abdominal: Soft. There is no hepatomegaly.  Lymphadenopathy:    She has no cervical adenopathy.    She has no axillary adenopathy.       Right: No supraclavicular adenopathy present.       Left: No supraclavicular adenopathy present.    Data Reviewed BILATERAL BREAST MRI WITH AND WITHOUT CONTRAST  Technique: Multiplanar, multisequence MR images of both breasts  were obtained prior to and following the intravenous administration  of 12ml of Multihance. Three dimensional images were evaluated at  the independent DynaCad workstation.  Comparison: Recent mamm[facility] and [facility].  Findings: Mild to moderate normal background parenchymal  enhancement is identified.  An area of nipple/skin indentation is identified along the medial  left nipple.  5 mm deep to this indentation lies an area of non mass-like  enhancement measuring 1 x 2 x 1 cm (transverse X AP X CC). This  area exhibits persistent type of enhancement kinetics.  No other abnormal areas of enhancement are noted within either  breast.  No enlarged or abnormal-appearing lymph nodes are present.  Small scattered cysts within both breasts are noted.  No bony or upper hepatic abnormalities are identified.  There are multiple right pulmonary nodular opacities identified and  chest CT with contrast is recommended for further evaluation.  IMPRESSION:  1 x 2 x 1 cm non mass-like enhancement within the anterior medial   left breast, underlying the area of skin dimpling/nipple  retraction. This is indeterminate and tissue sampling / MR guided  biopsy is recommended for further evaluation.  Multiple right pulmonary nodular opacities. Metastatic disease is  not excluded and chest CT with contrast is recommended for further  evaluation.   Assessment    Clinical stage I left breast cancer    Plan        We discussed the staging and pathophysiology of breast cancer. We discussed all of the different options for treatment for breast cancer including surgery, chemotherapy, radiation therapy, Herceptin, and antiestrogen therapy.   We discussed a sentinel lymph node biopsy as she does not appear to having lymph node involvement right now. I think even with her age this is reasonable due to her lack of medical problems, activity level and possibility of changing therapy. We discussed the performance of that with injection of radioactive tracer and blue dye. We discussed that she would have an incision underneath her axillary hairline. We discussed that there is a bout a 10-20% chance of having a positive node with a sentinel lymph node   biopsy and we will await the permanent pathology to make any other first further decisions in terms of her treatment. One of these options might be to return to the operating room to perform an axillary lymph node dissection. We discussed about a 1-2% risk lifetime of chronic shoulder pain as well as lymphedema associated with a sentinel lymph node biopsy.  We discussed the options for treatment of the breast cancer which included lumpectomy versus a mastectomy. We discussed the performance of the lumpectomy with a wire placement. We discussed a 10-20% chance of a positive margin requiring reexcision in the operating room. We also discussed that she may need radiation therapy or antiestrogen therapy or both if she undergoes lumpectomy. We discussed the mastectomy and the postoperative care  for that as well. We discussed that there is no difference in her survival whether she undergoes lumpectomy with radiation therapy or antiestrogen therapy versus a mastectomy. There is a slight difference in the local recurrence rate being 3-5% with lumpectomy and about 1% with a mastectomy. I did tell her that this would be a wire guided central lumpectomy and would have her nipple areolar complex removed. We discussed the risks of operation including bleeding, infection, possible reoperation. She understands her further therapy will be based on what her stages at the time of her operation.      [name] 08/26/2011, 3:50 PM    

## 2011-08-27 ENCOUNTER — Other Ambulatory Visit (INDEPENDENT_AMBULATORY_CARE_PROVIDER_SITE_OTHER): Payer: Self-pay | Admitting: General Surgery

## 2011-08-27 DIAGNOSIS — C50119 Malignant neoplasm of central portion of unspecified female breast: Secondary | ICD-10-CM

## 2011-08-28 ENCOUNTER — Encounter (HOSPITAL_BASED_OUTPATIENT_CLINIC_OR_DEPARTMENT_OTHER): Payer: Self-pay | Admitting: *Deleted

## 2011-08-28 NOTE — Progress Notes (Signed)
Pt diabetic-pump-very good about caring for it- Will come in for ekg-bmet- Called cardiology for notes-has palpatations Very upset if teeth have to come out-does not want dr or family to see her without teeth.

## 2011-08-31 ENCOUNTER — Encounter (HOSPITAL_BASED_OUTPATIENT_CLINIC_OR_DEPARTMENT_OTHER)
Admission: RE | Admit: 2011-08-31 | Discharge: 2011-08-31 | Disposition: A | Discharge status: Home / Self Care | Payer: Medicare Other | Source: Ambulatory Visit | Attending: General Surgery | Admitting: General Surgery

## 2011-08-31 ENCOUNTER — Other Ambulatory Visit: Payer: Self-pay

## 2011-08-31 LAB — BASIC METABOLIC PANEL
BUN: 14 mg/dL (ref 6–23)
GFR calc Af Amer: >90 mL/min (ref >90)
GFR calc non Af Amer: 83 mL/min — ABNORMAL LOW (ref >90)
Potassium: 4.6 mEq/L (ref 3.5–5.1)
Sodium: 130 mEq/L — ABNORMAL LOW (ref 135–145)

## 2011-09-01 ENCOUNTER — Telehealth: Payer: Self-pay | Admitting: *Deleted

## 2011-09-01 NOTE — Telephone Encounter (Signed)
Spoke to pt regarding [facility] on 08/26/11.  Pt denies questions or concerns at this time.  Pt still undecided on radiation or anti-estrogen oral therapy.  Pt relate she will let me know by Friday.  Encourage pt to call with needs.  Received verbal understanding.  Contact information given.  Pt is scheduled for surgery on 09/02/11 with f/u with Dr. Welton Flakes on 2/15.

## 2011-09-02 ENCOUNTER — Ambulatory Visit (HOSPITAL_BASED_OUTPATIENT_CLINIC_OR_DEPARTMENT_OTHER): Payer: Medicare Other | Admitting: Anesthesiology

## 2011-09-02 ENCOUNTER — Encounter (HOSPITAL_BASED_OUTPATIENT_CLINIC_OR_DEPARTMENT_OTHER): Payer: Self-pay | Admitting: Anesthesiology

## 2011-09-02 ENCOUNTER — Ambulatory Visit (HOSPITAL_BASED_OUTPATIENT_CLINIC_OR_DEPARTMENT_OTHER)
Admission: RE | Admit: 2011-09-02 | Discharge: 2011-09-02 | Disposition: A | Discharge status: Home / Self Care | Payer: Medicare Other | Source: Ambulatory Visit | Attending: General Surgery | Admitting: General Surgery

## 2011-09-02 ENCOUNTER — Encounter (HOSPITAL_BASED_OUTPATIENT_CLINIC_OR_DEPARTMENT_OTHER)
Admission: RE | Disposition: A | Discharge status: Home / Self Care | Payer: Self-pay | Source: Ambulatory Visit | Attending: General Surgery

## 2011-09-02 ENCOUNTER — Other Ambulatory Visit (INDEPENDENT_AMBULATORY_CARE_PROVIDER_SITE_OTHER): Payer: Self-pay | Admitting: General Surgery

## 2011-09-02 ENCOUNTER — Encounter: Payer: Self-pay | Admitting: *Deleted

## 2011-09-02 ENCOUNTER — Ambulatory Visit
Admission: RE | Admit: 2011-09-02 | Discharge: 2011-09-02 | Disposition: A | Discharge status: Home / Self Care | Payer: Medicare Other | Source: Ambulatory Visit | Attending: General Surgery | Admitting: General Surgery

## 2011-09-02 ENCOUNTER — Ambulatory Visit (HOSPITAL_COMMUNITY)
Admission: RE | Admit: 2011-09-02 | Discharge: 2011-09-02 | Disposition: A | Discharge status: Home / Self Care | Payer: Medicare Other | Source: Ambulatory Visit | Attending: General Surgery | Admitting: General Surgery

## 2011-09-02 ENCOUNTER — Encounter (HOSPITAL_BASED_OUTPATIENT_CLINIC_OR_DEPARTMENT_OTHER): Payer: Self-pay | Admitting: *Deleted

## 2011-09-02 DIAGNOSIS — Z794 Long term (current) use of insulin: Secondary | ICD-10-CM | POA: Insufficient documentation

## 2011-09-02 DIAGNOSIS — D0590 Unspecified type of carcinoma in situ of unspecified breast: Secondary | ICD-10-CM | POA: Insufficient documentation

## 2011-09-02 DIAGNOSIS — I10 Essential (primary) hypertension: Secondary | ICD-10-CM | POA: Insufficient documentation

## 2011-09-02 DIAGNOSIS — C50119 Malignant neoplasm of central portion of unspecified female breast: Secondary | ICD-10-CM

## 2011-09-02 DIAGNOSIS — E109 Type 1 diabetes mellitus without complications: Secondary | ICD-10-CM | POA: Insufficient documentation

## 2011-09-02 DIAGNOSIS — C50019 Malignant neoplasm of nipple and areola, unspecified female breast: Secondary | ICD-10-CM | POA: Insufficient documentation

## 2011-09-02 DIAGNOSIS — C50919 Malignant neoplasm of unspecified site of unspecified female breast: Secondary | ICD-10-CM

## 2011-09-02 DIAGNOSIS — Z01810 Encounter for preprocedural cardiovascular examination: Secondary | ICD-10-CM | POA: Insufficient documentation

## 2011-09-02 HISTORY — DX: Hypothyroidism, unspecified: E03.9

## 2011-09-02 HISTORY — PX: BREAST SURGERY: SHX581

## 2011-09-02 HISTORY — DX: Essential (primary) hypertension: I10

## 2011-09-02 SURGERY — BREAST LUMPECTOMY WITH SENTINEL LYMPH NODE BX
Anesthesia: General | Laterality: Left | Wound class: Clean

## 2011-09-02 MED ORDER — MIDAZOLAM HCL 2 MG/2ML IJ SOLN
0.5000 mg | INTRAMUSCULAR | Status: DC | PRN
  Administered 2011-09-02: 2 mg via INTRAVENOUS

## 2011-09-02 MED ORDER — FENTANYL CITRATE 0.05 MG/ML IJ SOLN
25.0000 ug | INTRAMUSCULAR | Status: DC | PRN
  Administered 2011-09-02: 50 ug via INTRAVENOUS
  Administered 2011-09-02 (×2): 25 ug via INTRAVENOUS

## 2011-09-02 MED ORDER — ACETAMINOPHEN 10 MG/ML IV SOLN
1000.0000 mg | Freq: Once | INTRAVENOUS | Status: AC
  Administered 2011-09-02: 1000 mg via INTRAVENOUS

## 2011-09-02 MED ORDER — SODIUM CHLORIDE 0.9 % IJ SOLN
INTRAMUSCULAR | Status: DC | PRN
  Administered 2011-09-02: 11:00:00 via INTRAMUSCULAR

## 2011-09-02 MED ORDER — ACETAMINOPHEN 325 MG PO TABS: 650.0000 mg | ORAL_TABLET | Freq: Four times a day (QID) | ORAL | Status: DC | PRN

## 2011-09-02 MED ORDER — CEFAZOLIN SODIUM 1-5 GM-% IV SOLN
1.0000 g | INTRAVENOUS | Status: AC
  Administered 2011-09-02: 1 g via INTRAVENOUS

## 2011-09-02 MED ORDER — OXYCODONE HCL 5 MG PO TABS: 5.0000 mg | ORAL_TABLET | ORAL | Status: DC | PRN

## 2011-09-02 MED ORDER — FENTANYL CITRATE 0.05 MG/ML IJ SOLN
50.0000 ug | INTRAMUSCULAR | Status: DC | PRN
  Administered 2011-09-02: 100 ug via INTRAVENOUS

## 2011-09-02 MED ORDER — MEPERIDINE HCL 50 MG PO TABS
50.0000 mg | ORAL_TABLET | ORAL | Status: DC | PRN
  Administered 2011-09-02: 50 mg via ORAL

## 2011-09-02 MED ORDER — SODIUM CHLORIDE 0.9 % IV SOLN: INTRAVENOUS | Status: DC

## 2011-09-02 MED ORDER — TECHNETIUM TC 99M SULFUR COLLOID FILTERED
1.0000 | Freq: Once | INTRAVENOUS | Status: AC | PRN
  Administered 2011-09-02: 1 via INTRADERMAL

## 2011-09-02 MED ORDER — MORPHINE SULFATE 2 MG/ML IJ SOLN: 1.0000 mg | INTRAMUSCULAR | Status: DC | PRN

## 2011-09-02 MED ORDER — ONDANSETRON HCL 4 MG/2ML IJ SOLN: 4.0000 mg | Freq: Four times a day (QID) | INTRAMUSCULAR | Status: DC | PRN

## 2011-09-02 MED ORDER — LACTATED RINGERS IV SOLN
INTRAVENOUS | Status: DC
  Administered 2011-09-02 (×2): via INTRAVENOUS

## 2011-09-02 MED ORDER — INSULIN ASPART 100 UNIT/ML ~~LOC~~ SOLN: 0.0000 [IU] | Freq: Three times a day (TID) | SUBCUTANEOUS | Status: DC

## 2011-09-02 MED ORDER — ACETAMINOPHEN 650 MG RE SUPP: 650.0000 mg | Freq: Four times a day (QID) | RECTAL | Status: DC | PRN

## 2011-09-02 MED ORDER — BUPIVACAINE HCL (PF) 0.25 % IJ SOLN
INTRAMUSCULAR | Status: DC | PRN
  Administered 2011-09-02: 10 mL

## 2011-09-02 MED ORDER — OXYCODONE-ACETAMINOPHEN 5-325 MG PO TABS: 1.0000 | ORAL_TABLET | ORAL | Status: DC | PRN

## 2011-09-02 MED ORDER — ONDANSETRON HCL 4 MG/2ML IJ SOLN
INTRAMUSCULAR | Status: DC | PRN
  Administered 2011-09-02 (×2): 4 mg via INTRAVENOUS

## 2011-09-02 MED ORDER — PROPOFOL 10 MG/ML IV EMUL
INTRAVENOUS | Status: DC | PRN
  Administered 2011-09-02: 150 mg via INTRAVENOUS

## 2011-09-02 MED ORDER — METOCLOPRAMIDE HCL 5 MG/ML IJ SOLN: 10.0000 mg | Freq: Once | INTRAMUSCULAR | Status: DC | PRN

## 2011-09-02 MED ORDER — METOCLOPRAMIDE HCL 5 MG/ML IJ SOLN
INTRAMUSCULAR | Status: DC | PRN
  Administered 2011-09-02: 10 mg via INTRAVENOUS

## 2011-09-02 MED ORDER — SODIUM CHLORIDE 0.9 % IJ SOLN: INTRAMUSCULAR | Status: DC | PRN

## 2011-09-02 MED ORDER — FENTANYL CITRATE 0.05 MG/ML IJ SOLN
INTRAMUSCULAR | Status: DC | PRN
  Administered 2011-09-02: 25 ug via INTRAVENOUS
  Administered 2011-09-02: 50 ug via INTRAVENOUS

## 2011-09-02 MED ORDER — METHYLENE BLUE 1 % INJ SOLN: INTRAMUSCULAR | Status: DC | PRN

## 2011-09-02 SURGICAL SUPPLY — 64 items
ADH SKN CLS APL DERMABOND .7 (GAUZE/BANDAGES/DRESSINGS)
APL SKNCLS STERI-STRIP NONHPOA (GAUZE/BANDAGES/DRESSINGS) ×1
APPLIER CLIP 9.375 MED OPEN (MISCELLANEOUS) ×2
APR CLP MED 9.3 20 MLT OPN (MISCELLANEOUS) ×1
BENZOIN TINCTURE PRP APPL 2/3 (GAUZE/BANDAGES/DRESSINGS) ×2 IMPLANT
BINDER BREAST LRG (GAUZE/BANDAGES/DRESSINGS) ×1 IMPLANT
BINDER BREAST MEDIUM (GAUZE/BANDAGES/DRESSINGS) IMPLANT
BINDER BREAST XLRG (GAUZE/BANDAGES/DRESSINGS) IMPLANT
BINDER BREAST XXLRG (GAUZE/BANDAGES/DRESSINGS) IMPLANT
BLADE SURG 15 STRL LF DISP TIS (BLADE) ×1 IMPLANT
BLADE SURG 15 STRL SS (BLADE) ×2
BNDG COHESIVE 4X5 TAN STRL (GAUZE/BANDAGES/DRESSINGS) IMPLANT
CANISTER SUCTION 1200CC (MISCELLANEOUS) ×1 IMPLANT
CHLORAPREP W/TINT 26ML (MISCELLANEOUS) ×2 IMPLANT
CLIP APPLIE 9.375 MED OPEN (MISCELLANEOUS) IMPLANT
CLOSURE STERI STRIP 1/2 X4 (GAUZE/BANDAGES/DRESSINGS) ×1 IMPLANT
CLOTH BEACON ORANGE TIMEOUT ST (SAFETY) ×2 IMPLANT
COVER MAYO STAND STRL (DRAPES) ×3 IMPLANT
COVER PROBE W GEL 5X96 (DRAPES) ×2 IMPLANT
COVER TABLE BACK 60X90 (DRAPES) ×2 IMPLANT
DECANTER SPIKE VIAL GLASS SM (MISCELLANEOUS) IMPLANT
DERMABOND ADVANCED (GAUZE/BANDAGES/DRESSINGS)
DERMABOND ADVANCED .7 DNX12 (GAUZE/BANDAGES/DRESSINGS) IMPLANT
DEVICE DUBIN W/COMP PLATE 8390 (MISCELLANEOUS) ×1 IMPLANT
DRAIN CHANNEL 19F RND (DRAIN) IMPLANT
DRAPE LAPAROSCOPIC ABDOMINAL (DRAPES) IMPLANT
DRAPE U-SHAPE 76X120 STRL (DRAPES) IMPLANT
DRSG TEGADERM 4X4.75 (GAUZE/BANDAGES/DRESSINGS) ×3 IMPLANT
ELECT COATED BLADE 2.86 ST (ELECTRODE) ×2 IMPLANT
ELECT REM PT RETURN 9FT ADLT (ELECTROSURGICAL) ×2
ELECTRODE REM PT RTRN 9FT ADLT (ELECTROSURGICAL) ×1 IMPLANT
EVACUATOR SILICONE 100CC (DRAIN) IMPLANT
GAUZE SPONGE 4X4 12PLY STRL LF (GAUZE/BANDAGES/DRESSINGS) ×1 IMPLANT
GLOVE BIO SURGEON STRL SZ7 (GLOVE) ×4 IMPLANT
GLOVE BIOGEL PI IND STRL 7.5 (GLOVE) ×1 IMPLANT
GLOVE BIOGEL PI INDICATOR 7.5 (GLOVE) ×1
GOWN PREVENTION PLUS XLARGE (GOWN DISPOSABLE) ×3 IMPLANT
KIT MARKER MARGIN INK (KITS) ×2 IMPLANT
NDL HYPO 25X1 1.5 SAFETY (NEEDLE) ×2 IMPLANT
NDL SAFETY ECLIPSE 18X1.5 (NEEDLE) ×1 IMPLANT
NEEDLE HYPO 18GX1.5 SHARP (NEEDLE) ×2
NEEDLE HYPO 25X1 1.5 SAFETY (NEEDLE) ×4 IMPLANT
NS IRRIG 1000ML POUR BTL (IV SOLUTION) ×1 IMPLANT
PACK BASIN DAY SURGERY FS (CUSTOM PROCEDURE TRAY) ×2 IMPLANT
PENCIL BUTTON HOLSTER BLD 10FT (ELECTRODE) ×2 IMPLANT
PIN SAFETY STERILE (MISCELLANEOUS) IMPLANT
SLEEVE SCD COMPRESS KNEE MED (MISCELLANEOUS) ×2 IMPLANT
SPONGE LAP 18X18 X RAY DECT (DISPOSABLE) IMPLANT
SPONGE LAP 4X18 X RAY DECT (DISPOSABLE) ×3 IMPLANT
STOCKINETTE IMPERVIOUS LG (DRAPES) IMPLANT
STRIP CLOSURE SKIN 1/2X4 (GAUZE/BANDAGES/DRESSINGS) ×2 IMPLANT
SUT MNCRL AB 4-0 PS2 18 (SUTURE) ×4 IMPLANT
SUT SILK 2 0 SH (SUTURE) ×1 IMPLANT
SUT VIC AB 2-0 SH 27 (SUTURE) ×6
SUT VIC AB 2-0 SH 27XBRD (SUTURE) ×1 IMPLANT
SUT VIC AB 3-0 SH 27 (SUTURE) ×2
SUT VIC AB 3-0 SH 27X BRD (SUTURE) IMPLANT
SUT VICRYL AB 3 0 TIES (SUTURE) IMPLANT
SYR CONTROL 10ML LL (SYRINGE) ×4 IMPLANT
TOWEL OR 17X24 6PK STRL BLUE (TOWEL DISPOSABLE) ×2 IMPLANT
TOWEL OR NON WOVEN STRL DISP B (DISPOSABLE) ×2 IMPLANT
TUBE CONNECTING 20X1/4 (TUBING) ×1 IMPLANT
WATER STERILE IRR 1000ML POUR (IV SOLUTION) ×1 IMPLANT
YANKAUER SUCT BULB TIP NO VENT (SUCTIONS) ×1 IMPLANT

## 2011-09-02 NOTE — H&P (View-Only) (Signed)
Patient ID: [name], female   DOB: 12-Feb-1929, 78 y.o.   MRN: [id number]  Chief Complaint  Patient presents with  . Other    breast cancer    HPI [name] is a 78 y.o. female.  Referred by Dr. Laveda Abbe HPI This is an 78 year old female who noticed an inverted left nipple. She saw her primary physician Dr. Nicholas Lose and had a cyst aspirated that appeared normal. This area persisted and she was referred to the breast center. She underwent a diagnostic left mammogram and ultrasound with the finding of nipple retraction. Following this she underwent an MRI that showed a 1 x 2 x 1 cm non-masslike enhancement within the anterior medial left breast just below her nipple.  There was also multiple right pulmonary nodular opacities. This has since been followed by a chest CT which shows that the right direction decreased in size since 2008. These were recommended follow-up in one year. She then underwent MR biopsy of this subareolar lesion on the left side. Her pathology shows this to be an invasive lobular carcinoma that is estrogen receptor positive and 97%, progesterone receptor positive at 35%, proliferation index is 9% HER-2/neu is not amplified. She comes in today to discuss options. She does not have any other complaints outside of the inversion of her left nipple areolar complex.  Past Medical History  Diagnosis Date  . Thyroid disease     hypothyroidism  . Hypercholesteremia   . Diabetes mellitus     type I- wears insulin pump    Past Surgical History  Procedure Date  . Appendectomy   . Abdominal hysterectomy   . Bladder repair     Family History  Problem Relation Age of Onset  . Cancer Sister     Social History History  Substance Use Topics  . Smoking status: Never Smoker   . Smokeless tobacco: Not on file  . Alcohol Use: No    No Known Allergies  Current Outpatient Prescriptions  Medication Sig Dispense Refill  . calcium citrate (CALCITRATE - DOSED IN MG  ELEMENTAL CALCIUM) 950 MG tablet Take 1 tablet by mouth daily.      . calcium citrate-vitamin D (CITRACAL+D) 315-200 MG-UNIT per tablet Take 1 tablet by mouth 2 (two) times daily.      . hydrochlorothiazide (MICROZIDE) 12.5 MG capsule Take 12.5 mg by mouth daily.      . insulin aspart (NOVOLOG) 100 UNIT/ML injection Inject into the skin 3 (three) times daily before meals.      [name] levothyroxine (SYNTHROID, LEVOTHROID) 75 MCG tablet Take 75 mcg by mouth daily.      . Multiple Vitamin (MULTIVITAMIN) capsule Take 1 capsule by mouth daily.      . simvastatin (ZOCOR) 20 MG tablet Take 20 mg by mouth every evening.        Review of Systems Review of Systems  Constitutional: Negative for fever, chills and unexpected weight change.  HENT: Positive for sore throat. Negative for hearing loss, congestion, trouble swallowing and voice change.   Eyes: Negative for visual disturbance.  Respiratory: Positive for cough. Negative for wheezing.   Cardiovascular: Positive for palpitations. Negative for chest pain and leg swelling.  Gastrointestinal: Negative for nausea, vomiting, abdominal pain, diarrhea, constipation, blood in stool, abdominal distention and anal bleeding.  Genitourinary: Negative for hematuria, vaginal bleeding and difficulty urinating.  Musculoskeletal: Positive for arthralgias.  Skin: Negative for rash and wound.  Neurological: Negative for seizures, syncope and headaches.  Hematological: Negative  for adenopathy. Bruises/bleeds easily.  Psychiatric/Behavioral: Negative for confusion. The patient is nervous/anxious.     Wt Readings from Last 3 Encounters:  08/26/11 143 lb 11.2 oz (65.182 kg)   Temp Readings from Last 3 Encounters:  08/26/11 98.4 F (36.9 C) Oral   BP Readings from Last 3 Encounters:  08/26/11 184/68   Pulse Readings from Last 3 Encounters:  08/26/11 80    Physical Exam Physical Exam  Constitutional: She appears well-developed and well-nourished.  Eyes: No  scleral icterus.  Neck: Neck supple.  Cardiovascular: Normal rate, regular rhythm and normal heart sounds.   Pulmonary/Chest: Effort normal and breath sounds normal. She has no wheezes. She has no rales. Right breast exhibits no inverted nipple, no mass, no nipple discharge, no skin change and no tenderness. Left breast exhibits inverted nipple (inferior nipple areolar complext inverted). Left breast exhibits no mass, no nipple discharge, no skin change and no tenderness. Breasts are symmetrical.  Abdominal: Soft. There is no hepatomegaly.  Lymphadenopathy:    She has no cervical adenopathy.    She has no axillary adenopathy.       Right: No supraclavicular adenopathy present.       Left: No supraclavicular adenopathy present.    Data Reviewed BILATERAL BREAST MRI WITH AND WITHOUT CONTRAST  Technique: Multiplanar, multisequence MR images of both breasts  were obtained prior to and following the intravenous administration  of 12ml of Multihance. Three dimensional images were evaluated at  the independent DynaCad workstation.  Comparison: Recent mamm[facility] and [facility].  Findings: Mild to moderate normal background parenchymal  enhancement is identified.  An area of nipple/skin indentation is identified along the medial  left nipple.  5 mm deep to this indentation lies an area of non mass-like  enhancement measuring 1 x 2 x 1 cm (transverse X AP X CC). This  area exhibits persistent type of enhancement kinetics.  No other abnormal areas of enhancement are noted within either  breast.  No enlarged or abnormal-appearing lymph nodes are present.  Small scattered cysts within both breasts are noted.  No bony or upper hepatic abnormalities are identified.  There are multiple right pulmonary nodular opacities identified and  chest CT with contrast is recommended for further evaluation.  IMPRESSION:  1 x 2 x 1 cm non mass-like enhancement within the anterior medial   left breast, underlying the area of skin dimpling/nipple  retraction. This is indeterminate and tissue sampling / MR guided  biopsy is recommended for further evaluation.  Multiple right pulmonary nodular opacities. Metastatic disease is  not excluded and chest CT with contrast is recommended for further  evaluation.   Assessment    Clinical stage I left breast cancer    Plan        We discussed the staging and pathophysiology of breast cancer. We discussed all of the different options for treatment for breast cancer including surgery, chemotherapy, radiation therapy, Herceptin, and antiestrogen therapy.   We discussed a sentinel lymph node biopsy as she does not appear to having lymph node involvement right now. I think even with her age this is reasonable due to her lack of medical problems, activity level and possibility of changing therapy. We discussed the performance of that with injection of radioactive tracer and blue dye. We discussed that she would have an incision underneath her axillary hairline. We discussed that there is a bout a 10-20% chance of having a positive node with a sentinel lymph node  biopsy and we will await the permanent pathology to make any other first further decisions in terms of her treatment. One of these options might be to return to the operating room to perform an axillary lymph node dissection. We discussed about a 1-2% risk lifetime of chronic shoulder pain as well as lymphedema associated with a sentinel lymph node biopsy.  We discussed the options for treatment of the breast cancer which included lumpectomy versus a mastectomy. We discussed the performance of the lumpectomy with a wire placement. We discussed a 10-20% chance of a positive margin requiring reexcision in the operating room. We also discussed that she may need radiation therapy or antiestrogen therapy or both if she undergoes lumpectomy. We discussed the mastectomy and the postoperative care  for that as well. We discussed that there is no difference in her survival whether she undergoes lumpectomy with radiation therapy or antiestrogen therapy versus a mastectomy. There is a slight difference in the local recurrence rate being 3-5% with lumpectomy and about 1% with a mastectomy. I did tell her that this would be a wire guided central lumpectomy and would have her nipple areolar complex removed. We discussed the risks of operation including bleeding, infection, possible reoperation. She understands her further therapy will be based on what her stages at the time of her operation.      Elyanah Farino 08/26/2011, 3:50 PM

## 2011-09-02 NOTE — Progress Notes (Signed)
Emotional support during breast injections °

## 2011-09-02 NOTE — Transfer of Care (Signed)
Immediate Anesthesia Transfer of Care Note  Patient: [name]  Procedure(s) Performed:  BREAST LUMPECTOMY WITH SENTINEL LYMPH NODE BX - left breast wire guided central lumpectomy, left axillary sentinel node biopsy  Patient Location: PACU  Anesthesia Type: General  Level of Consciousness: awake and alert   Airway & Oxygen Therapy: Patient Spontanous Breathing and Patient connected to face mask oxygen  Post-op Assessment: Report given to PACU RN and Post -op Vital signs reviewed and stable  Post vital signs: Reviewed and stable  Complications: No apparent anesthesia complications

## 2011-09-02 NOTE — Op Note (Signed)
Preoperative diagnosis: Clinical stage I left breast cancer Postoperative diagnosis: Same as above  Procedure: #1 left breast wire-guided central lumpectomy #2 left axillary sentinel lymph node biopsy #3 injection of methylene blue dye for sentinel node identification  Surgeon: Dr. Harden Mo Anesthesia: Gen. Specimens: #1 left breast central lumpectomy marked with paint kit #2 additional superior and deep margin marked with a short stitch superior, long stitch lateral, double stitch deep #3 left axillary sentinel lymph node  Complications: None Estimated blood loss: None Sponge and needle count was correct x 2 at operation Disposition patient to recovery room in stable condition  Indications: This is an 78 year old female who noted an inverted nipple on her self-examination. She is now undergoing evaluation as an invasive lobular carcinoma that is immediately beneath her left nipple. She was seen in multidisciplinary breast clinic. We have elected to do a left breast wire guided central lumpectomy and left axillary sentinel lymph node biopsy.  Procedure: After informed consent was obtained the patient was taken to the breast center where she had a wire placed. I had these mammograms available for my review in the operating room. She was then brought to day surgery. She then had technetium administered in the standard periareolar fashion. She was then taken to the operating room. She was administered 1 g of intravenous cefazolin. Sequential compression devices were placed on her legs prior to induction of anesthesia. She was in place under general anesthesia without complication. Her left breast and axilla were then prepped and draped in the standard sterile surgical fashion. A surgical timeout was performed.  I first approached the breast lesion. I made an elliptical incision encompassing the nipple areolar complex. I then brought the wire in remotely. I removed the entire subareolar area  including the wire as well as what appeared to be the lesion. A mammogram was taken showing that it appeared that my posterior margin was a little bit close. I then excised an additional superior and posterior margin. This is where I thought was close on the mammogram and clinically. The deep margin is now the pectoralis muscle as I removed her fascia. This was then marked with a paint kit. The additional margin was marked with stitches. I then mobilized the breast tissue off the pectoralis muscle. I then closes with 2-0 Vicryl in multiple layers. I then closed with 3-0 Vicryl and 4-0 Monocryl. I then made a small incision in the left axilla. I used the neoprobe to identify one small packet of lymph nodes. The counts were only 40-50. They were not blue. I excised these nodes and passed off the table as a specimen. There were no further radioactivity or any blue dye present. Hemostasis was observed. I closes with a 2-0 Vicryl, 3-0 Vicryl, 4-0 Monocryl. Steri-Strips and sterile dressings were placed on both. A breast binder was placed. She tolerated this well was extubated and transferred to the recovery room in stable condition.

## 2011-09-02 NOTE — Anesthesia Procedure Notes (Signed)
Procedure Name: LMA Insertion Date/Time: 09/02/2011 11:07 AM Performed by: Zenia Resides D Pre-anesthesia Checklist: Patient identified, Emergency Drugs available, Suction available, Patient being monitored and Timeout performed Patient Re-evaluated:Patient Re-evaluated prior to inductionOxygen Delivery Method: Circle System Utilized Preoxygenation: Pre-oxygenation with 100% oxygen Intubation Type: IV induction Ventilation: Mask ventilation without difficulty LMA: LMA with gastric port inserted LMA Size: 4.0 Number of attempts: 1 Placement Confirmation: positive ETCO2 and breath sounds checked- equal and bilateral Tube secured with: Tape Dental Injury: Teeth and Oropharynx as per pre-operative assessment

## 2011-09-02 NOTE — Anesthesia Postprocedure Evaluation (Signed)
Anesthesia Post Note  Patient: [name]  Procedure(s) Performed:  BREAST LUMPECTOMY WITH SENTINEL LYMPH NODE BX - left breast wire guided central lumpectomy, left axillary sentinel node biopsy  Anesthesia type: General  Patient location: PACU  Post pain: Pain level controlled  Post assessment: Patient's Cardiovascular Status Stable  Last Vitals:  Filed Vitals:   09/02/11 1345  BP: 157/72  Pulse: 85  Temp: 36.7 C  Resp: 20    Post vital signs: Reviewed and stable  Level of consciousness: alert  Complications: No apparent anesthesia complications

## 2011-09-02 NOTE — Anesthesia Preprocedure Evaluation (Signed)
Anesthesia Evaluation  Patient identified by MRN, date of birth, ID band Patient awake    Reviewed: Allergy & Precautions, H&P , NPO status , Patient's Chart, lab work & pertinent test results, reviewed documented beta blocker date and time   Airway Mallampati: II TM Distance: >3 FB Neck ROM: full    Dental   Pulmonary neg pulmonary ROS,          Cardiovascular hypertension, On Medications     Neuro/Psych Negative Neurological ROS  Negative Psych ROS   GI/Hepatic negative GI ROS, Neg liver ROS,   Endo/Other  Diabetes mellitus-, Type 1, Insulin Dependent  Renal/GU negative Renal ROS  Genitourinary negative   Musculoskeletal   Abdominal   Peds  Hematology negative hematology ROS (+)   Anesthesia Other Findings See surgeon's H&P   Reproductive/Obstetrics negative OB ROS                           Anesthesia Physical Anesthesia Plan  ASA: III  Anesthesia Plan: General   Post-op Pain Management:    Induction: Intravenous  Airway Management Planned: LMA  Additional Equipment:   Intra-op Plan:   Post-operative Plan: Extubation in OR  Informed Consent: I have reviewed the patients History and Physical, chart, labs and discussed the procedure including the risks, benefits and alternatives for the proposed anesthesia with the patient or authorized representative who has indicated his/her understanding and acceptance.     Plan Discussed with: CRNA and Surgeon  Anesthesia Plan Comments:         Anesthesia Quick Evaluation

## 2011-09-02 NOTE — Interval H&P Note (Signed)
History and Physical Interval Note:  09/02/2011 10:25 AM  [name]  has presented today for surgery, with the diagnosis of left breast cancer  The various methods of treatment have been discussed with the patient and family. After consideration of risks, benefits and other options for treatment, the patient has consented to  Procedure(s): Left breast wire guided central lumpectomy and left axillary sentinel node biopsy as a surgical intervention .  The patients' history has been reviewed, patient examined, no change in status, stable for surgery.  I have reviewed the patients' chart and labs.  Questions were answered to the patient's satisfaction.     Ersie Savino

## 2011-09-02 NOTE — Progress Notes (Signed)
Mailed after appt letter to pt. 

## 2011-09-03 ENCOUNTER — Telehealth (INDEPENDENT_AMBULATORY_CARE_PROVIDER_SITE_OTHER): Payer: Self-pay | Admitting: General Surgery

## 2011-09-03 LAB — GLUCOSE, CAPILLARY
Glucose-Capillary: 206 mg/dL — ABNORMAL HIGH (ref 70–99)
Glucose-Capillary: 211 mg/dL — ABNORMAL HIGH (ref 70–99)

## 2011-09-03 MED ORDER — PROMETHAZINE HCL 12.5 MG PO TABS: 12.5000 mg | ORAL_TABLET | Freq: Four times a day (QID) | ORAL | Status: AC | PRN

## 2011-09-03 NOTE — Progress Notes (Signed)
[name] [id number] 01/10/1929 78 y.o. 09/03/2011 12:11 AM  CC Dr. Beather Arbour Dr. Emelia Loron Dr. Antony Blackbird  REASON FOR CONSULTATION:  78 year old female with invasive mammary carcinoma of the left breast being seen in the multidisciplinary clinic for discussion of treatment options.  STAGE:  Cancer of upper-outer quadrant of female breast   Primary site: Breast (Left)   Staging method: AJCC 7th Edition   Clinical free text: [id number]   Clinical: Stage IIA signed by Emelia Loron, MD on 08/26/2011  2:05 PM   Summary: Stage IIA  REFERRING PHYSICIAN: Dr. Beather Arbour  HISTORY OF PRESENT ILLNESS:  [name] is a 78 y.o. female with past medical history significant for hypothyroidism hypercholesterolemia hypertension and diabetes patient wears and insulin pump. Patient recently was seen by Dr. Beather Arbour who aspirated a cyst. However in spite of that nipple retraction persisted and the patient underwent a diagnostic left mammogram and left breast ultrasound. The ultrasound showed a 1.7 x 0.9 x 1.9 cm cyst within the 12:00 position of the left breast about 2 cm from the nipple area. The mammogram showed a circumscribed mass within the central left breast without distortion or suspicious calcifications. Patient then went on to have MRI of the breasts performed that showed 1 x 2 x 1 cm non-mass like enhancment within the anterior medial aspect of the left breast. This virus underlying the area of the skin dimpling and retraction. Patient went onto have a biopsy of this area performed. This revealed an invasive mammary carcinoma. The tumor was estrogen receptor +97% progesterone receptor +35% proliferation marker low 9% and HER-2/neu negative. Patient is now seen in the multidisciplinary breast clinic for discussion of treatment options. She is without any complaints.  Past Medical History: Past Medical History  Diagnosis Date  . Thyroid disease     hypothyroidism    . Hypercholesteremia   . Hypertension   . Hypothyroidism   . Diabetes mellitus     type I- wears insulin pump    Past Surgical History: Past Surgical History  Procedure Date  . Appendectomy   . Abdominal hysterectomy   . Bladder repair   . Tonsillectomy   . Breast surgery     br bx    Family History: Family History  Problem Relation Age of Onset  . Cancer Sister     Social History History  Substance Use Topics  . Smoking status: Former Smoker    Quit date: 08/28/1979  . Smokeless tobacco: Not on file  . Alcohol Use: Yes     rare    Allergies: No Known Allergies Current Medications: Current Outpatient Prescriptions  Medication Sig Dispense Refill  . calcium citrate (CALCITRATE - DOSED IN MG ELEMENTAL CALCIUM) 950 MG tablet Take 1 tablet by mouth daily.      . calcium citrate-vitamin D (CITRACAL+D) 315-200 MG-UNIT per tablet Take 1 tablet by mouth 2 (two) times daily.      . hydrochlorothiazide (MICROZIDE) 12.5 MG capsule Take 12.5 mg by mouth daily.      . insulin aspart (NOVOLOG) 100 UNIT/ML injection Inject into the skin 3 (three) times daily before meals.      [name] levothyroxine (SYNTHROID, LEVOTHROID) 75 MCG tablet Take 75 mcg by mouth daily.      . Multiple Vitamin (MULTIVITAMIN) capsule Take 1 capsule by mouth daily.      . simvastatin (ZOCOR) 20 MG tablet Take 20 mg by mouth every evening.      . cholecalciferol (  VITAMIN D) 1000 UNITS tablet Take 1,000 Units by mouth daily.      . Insulin Human (INSULIN PUMP) 100 unit/ml SOLN Inject into the skin.      [name] oxyCODONE-acetaminophen (ROXICET) 5-325 MG per tablet Take 1 tablet by mouth every 4 (four) hours as needed for pain.  20 tablet  0   No current facility-administered medications for this visit.   Facility-Administered Medications Ordered in Other Visits  Medication Dose Route Frequency Provider Last Rate Last Dose  . acetaminophen (OFIRMEV) IV 1,000 mg  1,000 mg Intravenous Once Constance Goltz, MD    1,000 mg at 09/02/11 1036  . ceFAZolin (ANCEF) IVPB 1 g/50 mL premix  1 g Intravenous 60 min Pre-Op Emelia Loron, MD   1 g at 09/02/11 1058  . technetium sulfur colloid ([place]ED[place]) filtered injection solution 1 milli Curie  1 milli Curie Intradermal Once PRN Medication Radiologist, MD   1 milli Curie at 09/02/11 1045  . DISCONTD: 0.9 %  sodium chloride infusion   Intravenous Continuous Emelia Loron, MD      . DISCONTD: acetaminophen (TYLENOL) suppository 650 mg  650 mg Rectal Q6H PRN Emelia Loron, MD      . DISCONTD: acetaminophen (TYLENOL) tablet 650 mg  650 mg Oral Q6H PRN Emelia Loron, MD      . DISCONTD: bupivacaine (MARCAINE) 0.25 % injection    PRN Emelia Loron, MD   10 mL at 09/02/11 1210  . DISCONTD: fentaNYL (SUBLIMAZE) injection 25-50 mcg  25-50 mcg Intravenous Q5 min PRN Constance Goltz, MD   25 mcg at 09/02/11 1320  . DISCONTD: fentaNYL (SUBLIMAZE) injection 50-100 mcg  50-100 mcg Intravenous PRN Constance Goltz, MD   100 mcg at 09/02/11 1035  . DISCONTD: fentaNYL (SUBLIMAZE) injection    PRN Ronnette Hila, CRNA   25 mcg at 09/02/11 1159  . DISCONTD: insulin aspart (novoLOG) injection 0-15 Units  0-15 Units Subcutaneous TID WC Emelia Loron, MD      . DISCONTD: lactated ringers infusion   Intravenous Continuous Zenon Mayo, MD      . DISCONTD: meperidine (DEMEROL) tablet 50 mg  50 mg Oral Q4H PRN Emelia Loron, MD   50 mg at 09/02/11 1541  . DISCONTD: methylene blue 1 % injection    PRN Emelia Loron, MD      . DISCONTD: methylene blue 2 mL in 0.9% normal saline 3 mL injection    PRN Emelia Loron, MD      . DISCONTD: metoCLOPramide (REGLAN) injection 10 mg  10 mg Intravenous Once PRN Constance Goltz, MD      . DISCONTD: metoCLOPramide (REGLAN) injection    PRN Ronnette Hila, CRNA   10 mg at 09/02/11 1114  . DISCONTD: midazolam (VERSED) injection 0.5-2 mg  0.5-2 mg Intravenous PRN Constance Goltz, MD    2 mg at 09/02/11 1035  . DISCONTD: morphine 2 MG/ML injection 1 mg  1 mg Intravenous Q2H PRN Emelia Loron, MD      . DISCONTD: ondansetron [facility]) injection 4 mg  4 mg Intravenous Q6H PRN Emelia Loron, MD      . DISCONTD: ondansetron [facility]) injection    PRN Ronnette Hila, CRNA   4 mg at 09/02/11 1200  . DISCONTD: oxyCODONE (Oxy IR/ROXICODONE) immediate release tablet 5 mg  5 mg Oral Q4H PRN Emelia Loron, MD      . DISCONTD: propofol (DIPRIVAN) 10 MG/ML infusion    PRN Ronnette Hila, CRNA  150 mg at 09/02/11 1105  . DISCONTD: sodium chloride 0.9 % injection    PRN Emelia Loron, MD        OB/GYN History: Menarche at age 12 patient underwent menopause 40 years ago she has been on hormone replacement therapy for years. She's had 3 term births but a total of 6 pregnancies her first live birth was at 70.  Fertility Discussion: N/A Prior History of Cancer:N/A  ECOG PERFORMANCE STATUS: 1 - Symptomatic but completely ambulatory  Genetic Counseling/testing: N/A  REVIEW OF SYSTEMS:  A comprehensive review of systems was negative.  PHYSICAL EXAMINATION: Blood pressure 184/68, pulse 80, temperature 98.4 F (36.9 C), temperature source Oral, height 5\' 7"  (1.702 m), weight 143 lb 11.2 oz (65.182 kg).  WUJ:WJXBJ, healthy, no distress, well nourished and well developed SKIN: no rashes or significant lesions HEAD: Normocephalic EYES: PERRLA, EOMI, Conjunctiva are pink and non-injected, sclera clear EARS: External ears normal OROPHARYNX:no exudate, no erythema and lips, buccal mucosa, and tongue normal  NECK: no adenopathy, no bruits, no JVD, thyroid normal size, non-tender, without nodularity LYMPH:  no palpable lymphadenopathy, no hepatosplenomegaly BREAST:abnormal mass palpable in the central breast which is likely a hematoma on the left side. No other masses are noted, LUNGS: clear to auscultation and percussion HEART: regular rate & rhythm, no murmurs and no  gallops ABDOMEN:abdomen soft, non-tender, obese, normal bowel sounds and no masses or organomegaly BACK: Back symmetric, no curvature., No CVA tenderness EXTREMITIES:no edema, no clubbing, no cyanosis  NEURO: alert & oriented x 3 with fluent speech, no focal motor/sensory deficits, gait normal, reflexes normal and symmetric  STUDIES/RESULTS: Ct Chest W Contrast  08/13/2011  *RADIOLOGY REPORT*  Clinical Data: Follow up of lung nodules  CT CHEST WITH CONTRAST  Technique:  Multidetector CT imaging of the chest was performed following the standard protocol during bolus administration of intravenous contrast.  Contrast:  75 ml Omnipaque-300  BUN and creatinine were obtained on site at [facility] at [address] Results:  BUN 9 mg/dL,  Creatinine 0.7 mg/dL.  Comparison: CT chest of 02/23/2007  Findings: The previously described small nodules in the periphery the left upper lobe  have diminished in size.  No new or enlarging lung nodule is seen.  There is very minimal ground-glass opacity within the right upper lobe anteriorly, of doubtful significance in this age patient. If further assessment is warranted, follow-up CT chest scan in 1 year would be recommended.  No pleural effusion is seen.  On soft tissue window images, there is little change in slightly prominent mediastinal nodes.  The pulmonary arteries and thoracic aorta opacify and there is atheromatous change within the aortic arch and descending thoracic aorta.  Faint coronary artery calcifications are noted.  The origins of the great vessels are patent.  No acute bony abnormality is seen.  IMPRESSION:  1.  No enlarging lung nodule is seen. 2.  Vague ground-glass opacity in the anterior right upper lobe. Question significance.  Consider follow-up CT chest scan in 1 year if necessary clinically. 3.  Stable mediastinal nodes. 4.  Faint coronary artery calcifications.  Original Report Authenticated By: Juline Patch, M.D.   Mr Biopsy/wire  Localization  08/18/2011  *RADIOLOGY REPORT*  Clinical Data: Suspicious non mass-like enhancement in the anterior left breast, underlying skin dimpling - for tissue sampling.  MRI GUIDED VACUUM ASSISTED BIOPSY OF THE LEFT BREAST WITHOUT AND WITH CONTRAST  Technique: Multiplanar, multisequence MR images of the left breast were obtained prior to  and following the intravenous administration of 12 ml of Multihance.  I met with the patient, and we discussed the procedure of MRI guided biopsy, including risks, benefits, and alternatives. Specifically, we discussed the risks of infection, bleeding, tissue injury, clip migration, and inadequate sampling.  Informed, written consent was given.  Using sterile technique, 2% Lidocaine, MRI guidance, and a 9 gauge vacuum assisted device, biopsy was performed of the non mass-like enhancement in the anterior inner left breast underlying the patient's skin dimpling.  At the conclusion of the procedure, a tissue marker clip was deployed into the biopsy cavity.  IMPRESSION: MRI guided biopsy of suspicious left breast enhancement  No apparent complications.  THREE-DIMENSIONAL MR IMAGE RENDERING ON INDEPENDENT WORKSTATION:  Three-dimensional MR images were rendered by post-processing of the original MR data on an independent workstation.  The three- dimensional MR images were interpreted, and findings were reported in the accompanying complete MRI report for this study.  Final pathology demonstrates INVASIVE MAMMARY CARCINOMA. Histology correlates with imaging findings.  The patient was contacted by phone on 08/18/2011 and these results given to her which she understood. Her questions were answered. The patient had no complaints with her biopsy site. Dr. Nicholas Lose was also informed of these results.  Recommend surgery/oncology consultation.  An appointment at the Multidisciplinary Clinic has been scheduled for 08/26/2011 and the patient notified.  Original Report Authenticated By: Rosendo Gros, M.D.   Nm Sentinel Node Inj-no Rpt (breast)  09/02/2011  CLINICAL DATA: Left breast cancer for left ax snbx   Sulfur colloid was injected intradermally by the nuclear medicine  technologist for breast cancer sentinel node localization.     Mm Breast Surgical Specimen  09/02/2011  *RADIOLOGY REPORT*  Clinical Data:  78 year old female with left breast cancer - wire localization prior to lumpectomy.  NEEDLE LOCALIZATION WITH MAMMOGRAPHIC GUIDANCE AND SPECIMEN RADIOGRAPH  Patient presents for needle localization prior to left lumpectomy. I met with the patient and we discussed the procedure of needle localization including benefits and alternatives. We discussed the high likelihood of a successful procedure. We discussed the risks of the procedure, including infection, bleeding, tissue injury, and further surgery. Informed, written consent was given.  Using mammographic guidance, sterile technique, 2% lidocaine and a 9 cm modified Kopans needle, the biopsy clip was localized using a superior approach.  The films are marked for Dr. Dwain Sarna.  Specimen radiograph was performed at day surgery, and confirms the wire and clip present in the tissue sample.  The specimen is marked for pathology.  IMPRESSION: Needle localization left breast.  No apparent complications.  Original Report Authenticated By: Rosendo Gros, M.D.   Mm Breast Wire Localization Left  09/02/2011  *RADIOLOGY REPORT*  Clinical Data:  78 year old female with left breast cancer - wire localization prior to lumpectomy.  NEEDLE LOCALIZATION WITH MAMMOGRAPHIC GUIDANCE AND SPECIMEN RADIOGRAPH  Patient presents for needle localization prior to left lumpectomy. I met with the patient and we discussed the procedure of needle localization including benefits and alternatives. We discussed the high likelihood of a successful procedure. We discussed the risks of the procedure, including infection, bleeding, tissue injury, and further surgery.  Informed, written consent was given.  Using mammographic guidance, sterile technique, 2% lidocaine and a 9 cm modified Kopans needle, the biopsy clip was localized using a superior approach.  The films are marked for Dr. Dwain Sarna.  Specimen radiograph was performed at day surgery, and confirms the wire and clip present in the tissue sample.  The  specimen is marked for pathology.  IMPRESSION: Needle localization left breast.  No apparent complications.  Original Report Authenticated By: Rosendo Gros, M.D.   Mm Digital Diagnostic Unilat L  08/17/2011  *RADIOLOGY REPORT*  Clinical Data:  Evaluate clip placement following MR guided left breast biopsy.  DIGITAL DIAGNOSTIC LEFT MAMMOGRAM  Comparison:  Prior studies  Findings:  Films are performed following MR guided biopsy of suspicious enhancement in the anterior inner left breast underlying skin dimpling.  The biopsy clip is in satisfactory position.  IMPRESSION: Satisfactory clip position following MR guided left breast biopsy.  Pathology will be followed.  Original Report Authenticated By: Rosendo Gros, M.D.     LABS:    Chemistry      Component Value Date/Time   NA 130* 08/31/2011 1523   K 4.6 08/31/2011 1523   CL 94* 08/31/2011 1523   CO2 29 08/31/2011 1523   BUN 14 08/31/2011 1523   CREATININE 0.62 08/31/2011 1523      Component Value Date/Time   CALCIUM 10.0 08/31/2011 1523   ALKPHOS 93 08/26/2011 1205   AST 29 08/26/2011 1205   ALT 15 08/26/2011 1205   BILITOT 0.3 08/26/2011 1205      Lab Results  Component Value Date   WBC 5.9 08/26/2011   HGB 12.7 08/26/2011   HCT 37.4 08/26/2011   MCV 88.0 08/26/2011   PLT 218 08/26/2011   ASSESSMENT   78 year old female with new diagnosis of invasive mammary carcinoma that is ER positive PR positive HER-2/neu negative. By MRI this area measures 1 x 2 x 1 cm. Patient is a good candidate for breast conservation with a lumpectomy. She is very much interested in having breast    PLAN:    Patient will  proceed with a lumpectomy to be performed by Dr. Emelia Loron. Once  Patient completes her surgery I will plan on seeing her back. Consisting of an aromatase inhibitor such as Arimidex 1 mg daily. My recommendation would be antiestrogen therapy     Thank you so much for allowing me to participate in the care of JALESHA PLOTZ. I will continue to follow up the patient with you and assist in her care.  All questions were answered. The patient knows to call the clinic with any problems, questions or concerns. We can certainly see the patient much sooner if necessary.  I spent 40 minutes counseling the patient face to face. The total time spent in the appointment was 60 minutes.  Drue Second, MD Medical/Oncology [facility] [phone] (beeper) [phone] (Office)  09/03/2011, 12:11 AM 09/03/2011, 12:11 AM

## 2011-09-03 NOTE — Telephone Encounter (Signed)
Patient states since she got home last night she has been very nauseated. Has lasted through the night and still happening this morning. Paged Dr Dwain Sarna for advise. Called in Phenergan for patient per verbal order from Dr Dwain Sarna. Patient made aware. Appt made for 09/24/11. Patient to call if this appt doesn't work or she is having problems.

## 2011-09-08 ENCOUNTER — Telehealth (INDEPENDENT_AMBULATORY_CARE_PROVIDER_SITE_OTHER): Payer: Self-pay

## 2011-09-08 NOTE — Telephone Encounter (Signed)
Called pt to let her know we still haven't received her path report yet as soon as Dr Cheral Almas gets the report he will contact pt.

## 2011-09-09 ENCOUNTER — Encounter (INDEPENDENT_AMBULATORY_CARE_PROVIDER_SITE_OTHER): Payer: Self-pay

## 2011-09-09 ENCOUNTER — Other Ambulatory Visit (INDEPENDENT_AMBULATORY_CARE_PROVIDER_SITE_OTHER): Payer: Self-pay | Admitting: General Surgery

## 2011-09-10 ENCOUNTER — Encounter: Payer: Self-pay | Admitting: *Deleted

## 2011-09-10 ENCOUNTER — Telehealth: Payer: Self-pay | Admitting: *Deleted

## 2011-09-10 NOTE — Telephone Encounter (Signed)
patient confirmed over the phone the new date and time on 09-24-2011 at 1:30pm

## 2011-09-10 NOTE — Progress Notes (Signed)
Pt request change in schedule to see Dr. Welton Flakes.  Pt lives in [place] and curious if appt could be moved to same day as post/op check on 2/14.  Request sent to schedulers to change appt to 2/14 at 1:30 to see Dr. Welton Flakes.

## 2011-09-11 ENCOUNTER — Encounter (HOSPITAL_COMMUNITY): Payer: Self-pay | Admitting: Pharmacy Technician

## 2011-09-14 ENCOUNTER — Encounter (INDEPENDENT_AMBULATORY_CARE_PROVIDER_SITE_OTHER): Payer: Self-pay | Admitting: General Surgery

## 2011-09-14 ENCOUNTER — Ambulatory Visit (INDEPENDENT_AMBULATORY_CARE_PROVIDER_SITE_OTHER): Payer: Medicare Other | Admitting: General Surgery

## 2011-09-14 VITALS — BP 120/70 | HR 78 | Temp 98.0°F | Resp 24 | Ht 67.0 in | Wt 143.0 lb

## 2011-09-14 DIAGNOSIS — Z09 Encounter for follow-up examination after completed treatment for conditions other than malignant neoplasm: Secondary | ICD-10-CM

## 2011-09-14 NOTE — Progress Notes (Signed)
Subjective:     Patient ID: [name], female   DOB: 11/11/1928, 78 y.o.   MRN: [id number]  HPI This is an 78-year-old female who I met at the breast multidisciplinary clinic. I then took her to the operating room last week and did a left breast lumpectomy that included the nipple areolar complex due to the position as well as a sentinel node biopsy. Her pathology shows invasive grade 1 lobular carcinoma spanning 9 mm with lobular carcinoma in situ present. There is no lymphovascular invasion. Her medial and superior margins are focally positive. Her sentinel  node is negative. I excised some additional tissue which I called and listed in my op report as superior and deep margin. The pathology report says anterior and deep and this is incorrect. I will discuss this with pathology. She comes in today for a postop check as well as to discuss reexcision.  Review of Systems     Objective:   Physical Exam  Pulmonary/Chest:         Healing incisions without infection        Assessment:     S/p lump/sn with positive medial margin    Plan:     She needs her medial margin excised. We discussed the other option would be a mastectomy but I don't think she needs this at all. We discussed return to the operating room and clearing the medial margin. We discussed a small chance of having a continued positive margin requiring further surgery. She and her husband are both agreeable to this all plan on doing this on Wednesday.      

## 2011-09-15 ENCOUNTER — Encounter (HOSPITAL_COMMUNITY)
Admission: RE | Admit: 2011-09-15 | Discharge: 2011-09-15 | Disposition: A | Discharge status: Home / Self Care | Payer: Medicare Other | Source: Ambulatory Visit | Attending: General Surgery | Admitting: General Surgery

## 2011-09-15 ENCOUNTER — Encounter (HOSPITAL_COMMUNITY): Payer: Self-pay

## 2011-09-15 LAB — SURGICAL PCR SCREEN
MRSA, PCR: NEGATIVE
Staphylococcus aureus: NEGATIVE

## 2011-09-15 NOTE — Patient Instructions (Addendum)
20 [name]  09/15/2011   Your procedure is scheduled on: 09-16-2011  Report to [facility] at 0630 AM.  Call this number if you have problems the morning of surgery: [phone]   Remember:bring insulin pump supplies x 2 sets   Do not eat food or drinki liquids:After Midnight.xanax if needed, synthroid    Clear liquids include soda, tea, black coffee, apple or grape juice, broth.  Take these medicines the morning of surgery with A SIP OF WATER:    Do not wear jewelry, make-up. Do not wear lotions, powders, or perfumes. Do not wear deodorant.  Do not shave 48 hours prior to surgery.  Do not bring valuables to the hospital.  Contacts, dentures or bridgework may not be worn into surgery.  Leave suitcase in the car. After surgery it may be brought to your room.  For patients admitted to the hospital, checkout time is 11:00 AM the day of discharge.   Patients discharged the day of surgery will not be allowed to drive home.  Name and phone number of your driver: spouse Renae Fickle [phone] cell  Special Instructions: CHG Shower Use Special Wash: 1/2 bottle night before surgery and 1/2 bottle morning of surgery.neck down avoid private area   Please read over the following fact sheets that you were given: MRSA Information  Jasmine December Lavora Brisbon rn [facility] pre op nurse phone number [phone]

## 2011-09-15 NOTE — Pre-Procedure Instructions (Addendum)
08-13-2011 chest ct in epic [facility]  08-26-2011 in epic bmet 08-31-2011 in epic Note with insulin pump setting dr Nonie Hoyer [facility] on chart ekg [id number] on chart and in epic  cardiac lov note dr Claris Gower [facility] 01-15-2011 on chart [facility] 02-20-2010 [facility] on chart

## 2011-09-16 ENCOUNTER — Encounter (HOSPITAL_COMMUNITY): Payer: Self-pay | Admitting: *Deleted

## 2011-09-16 ENCOUNTER — Other Ambulatory Visit (INDEPENDENT_AMBULATORY_CARE_PROVIDER_SITE_OTHER): Payer: Self-pay | Admitting: General Surgery

## 2011-09-16 ENCOUNTER — Encounter (HOSPITAL_COMMUNITY): Payer: Self-pay | Admitting: Anesthesiology

## 2011-09-16 ENCOUNTER — Encounter (HOSPITAL_COMMUNITY)
Admission: RE | Disposition: A | Discharge status: Home / Self Care | Payer: Self-pay | Source: Ambulatory Visit | Attending: General Surgery

## 2011-09-16 ENCOUNTER — Ambulatory Visit (HOSPITAL_COMMUNITY): Payer: Medicare Other | Admitting: Anesthesiology

## 2011-09-16 ENCOUNTER — Ambulatory Visit (HOSPITAL_COMMUNITY)
Admission: RE | Admit: 2011-09-16 | Discharge: 2011-09-16 | Disposition: A | Discharge status: Home / Self Care | Payer: Medicare Other | Source: Ambulatory Visit | Attending: General Surgery | Admitting: General Surgery

## 2011-09-16 DIAGNOSIS — Z794 Long term (current) use of insulin: Secondary | ICD-10-CM | POA: Insufficient documentation

## 2011-09-16 DIAGNOSIS — E039 Hypothyroidism, unspecified: Secondary | ICD-10-CM | POA: Insufficient documentation

## 2011-09-16 DIAGNOSIS — Z01812 Encounter for preprocedural laboratory examination: Secondary | ICD-10-CM | POA: Insufficient documentation

## 2011-09-16 DIAGNOSIS — E109 Type 1 diabetes mellitus without complications: Secondary | ICD-10-CM | POA: Insufficient documentation

## 2011-09-16 DIAGNOSIS — I10 Essential (primary) hypertension: Secondary | ICD-10-CM | POA: Insufficient documentation

## 2011-09-16 DIAGNOSIS — C50919 Malignant neoplasm of unspecified site of unspecified female breast: Secondary | ICD-10-CM | POA: Insufficient documentation

## 2011-09-16 DIAGNOSIS — E7800 Pure hypercholesterolemia, unspecified: Secondary | ICD-10-CM | POA: Insufficient documentation

## 2011-09-16 DIAGNOSIS — Z79899 Other long term (current) drug therapy: Secondary | ICD-10-CM | POA: Insufficient documentation

## 2011-09-16 LAB — GLUCOSE, CAPILLARY
Glucose-Capillary: 167 mg/dL — ABNORMAL HIGH (ref 70–99)
Glucose-Capillary: 102 mg/dL — ABNORMAL HIGH (ref 70–99)
Glucose-Capillary: 198 mg/dL — ABNORMAL HIGH (ref 70–99)

## 2011-09-16 SURGERY — RE-EXCISION OF BREAST CANCER,SUPERIOR MARGINS
Anesthesia: General | Site: Breast | Laterality: Left | Wound class: Clean

## 2011-09-16 MED ORDER — CEFAZOLIN SODIUM 1-5 GM-% IV SOLN
INTRAVENOUS | Status: AC
  Filled 2011-09-16: qty 50

## 2011-09-16 MED ORDER — CEFAZOLIN SODIUM 1-5 GM-% IV SOLN
INTRAVENOUS | Status: DC | PRN
  Administered 2011-09-16: 1 g via INTRAVENOUS

## 2011-09-16 MED ORDER — FENTANYL CITRATE 0.05 MG/ML IJ SOLN
25.0000 ug | INTRAMUSCULAR | Status: DC | PRN
  Administered 2011-09-16: 50 ug via INTRAVENOUS

## 2011-09-16 MED ORDER — OXYCODONE HCL 5 MG PO TABS
ORAL_TABLET | ORAL | Status: AC
  Administered 2011-09-16: 5 mg
  Filled 2011-09-16: qty 1

## 2011-09-16 MED ORDER — BUPIVACAINE HCL (PF) 0.25 % IJ SOLN
INTRAMUSCULAR | Status: AC
  Filled 2011-09-16: qty 60

## 2011-09-16 MED ORDER — PROMETHAZINE HCL 25 MG/ML IJ SOLN: 6.2500 mg | INTRAMUSCULAR | Status: DC | PRN

## 2011-09-16 MED ORDER — OXYCODONE HCL 5 MG PO TABS: 5.0000 mg | ORAL_TABLET | ORAL | Status: DC | PRN

## 2011-09-16 MED ORDER — HYDROCODONE-ACETAMINOPHEN 5-325 MG PO TABS: 1.0000 | ORAL_TABLET | Freq: Four times a day (QID) | ORAL | Status: DC | PRN

## 2011-09-16 MED ORDER — MIDAZOLAM HCL 5 MG/5ML IJ SOLN
INTRAMUSCULAR | Status: DC | PRN
  Administered 2011-09-16: 1 mg via INTRAVENOUS

## 2011-09-16 MED ORDER — SODIUM CHLORIDE 0.9 % IV SOLN: 250.0000 mL | INTRAVENOUS | Status: DC | PRN

## 2011-09-16 MED ORDER — MORPHINE SULFATE 10 MG/ML IJ SOLN: 1.0000 mg | INTRAMUSCULAR | Status: DC | PRN

## 2011-09-16 MED ORDER — PROPOFOL 10 MG/ML IV EMUL
INTRAVENOUS | Status: DC | PRN
  Administered 2011-09-16: 180 mg via INTRAVENOUS

## 2011-09-16 MED ORDER — LIDOCAINE HCL 1 % IJ SOLN
INTRAMUSCULAR | Status: DC | PRN
  Administered 2011-09-16: 60 mg via INTRADERMAL

## 2011-09-16 MED ORDER — ONDANSETRON HCL 4 MG/2ML IJ SOLN
INTRAMUSCULAR | Status: DC | PRN
  Administered 2011-09-16: 4 mg via INTRAVENOUS

## 2011-09-16 MED ORDER — SODIUM CHLORIDE 0.9 % IV SOLN
INTRAVENOUS | Status: DC | PRN
  Administered 2011-09-16: 08:00:00 via INTRAVENOUS

## 2011-09-16 MED ORDER — ACETAMINOPHEN 325 MG PO TABS: 650.0000 mg | ORAL_TABLET | ORAL | Status: DC | PRN

## 2011-09-16 MED ORDER — FENTANYL CITRATE 0.05 MG/ML IJ SOLN
INTRAMUSCULAR | Status: DC | PRN
  Administered 2011-09-16: 50 ug via INTRAVENOUS
  Administered 2011-09-16: 25 ug via INTRAVENOUS
  Administered 2011-09-16: 50 ug via INTRAVENOUS

## 2011-09-16 MED ORDER — BUPIVACAINE HCL 0.25 % IJ SOLN
INTRAMUSCULAR | Status: DC | PRN
  Administered 2011-09-16: 20 mL

## 2011-09-16 MED ORDER — SODIUM CHLORIDE 0.9 % IV SOLN: INTRAVENOUS | Status: DC

## 2011-09-16 MED ORDER — ONDANSETRON HCL 4 MG/2ML IJ SOLN: 4.0000 mg | Freq: Four times a day (QID) | INTRAMUSCULAR | Status: DC | PRN

## 2011-09-16 MED ORDER — CEFAZOLIN SODIUM 1-5 GM-% IV SOLN: 1.0000 g | INTRAVENOUS | Status: DC

## 2011-09-16 MED ORDER — FENTANYL CITRATE 0.05 MG/ML IJ SOLN
INTRAMUSCULAR | Status: AC
  Filled 2011-09-16: qty 2

## 2011-09-16 MED ORDER — LACTATED RINGERS IV SOLN: INTRAVENOUS | Status: DC

## 2011-09-16 MED ORDER — PROMETHAZINE HCL 25 MG/ML IJ SOLN: 12.5000 mg | Freq: Four times a day (QID) | INTRAMUSCULAR | Status: DC | PRN

## 2011-09-16 MED ORDER — ACETAMINOPHEN 650 MG RE SUPP
650.0000 mg | RECTAL | Status: DC | PRN
  Filled 2011-09-16: qty 1

## 2011-09-16 SURGICAL SUPPLY — 48 items
ADH SKN CLS APL DERMABOND .7 (GAUZE/BANDAGES/DRESSINGS) ×1
APPLIER CLIP 11 MED OPEN (CLIP) ×2
APR CLP MED 11 20 MLT OPN (CLIP) ×1
BINDER BREAST LRG (GAUZE/BANDAGES/DRESSINGS) ×1 IMPLANT
BLADE HEX COATED 2.75 (ELECTRODE) ×2 IMPLANT
BLADE SURG 15 STRL LF DISP TIS (BLADE) ×3 IMPLANT
BLADE SURG 15 STRL SS (BLADE) ×6
CANISTER SUCTION 2500CC (MISCELLANEOUS) ×2 IMPLANT
CHLORAPREP W/TINT 26ML (MISCELLANEOUS) ×1 IMPLANT
CLIP APPLIE 11 MED OPEN (CLIP) IMPLANT
CLOTH BEACON ORANGE TIMEOUT ST (SAFETY) ×2 IMPLANT
COVER SURGICAL LIGHT HANDLE (MISCELLANEOUS) ×2 IMPLANT
DECANTER SPIKE VIAL GLASS SM (MISCELLANEOUS) ×2 IMPLANT
DERMABOND ADVANCED (GAUZE/BANDAGES/DRESSINGS) ×1
DERMABOND ADVANCED .7 DNX12 (GAUZE/BANDAGES/DRESSINGS) IMPLANT
DRAPE LAPAROSCOPIC ABDOMINAL (DRAPES) ×2 IMPLANT
DRSG PAD ABDOMINAL 8X10 ST (GAUZE/BANDAGES/DRESSINGS) ×1 IMPLANT
ELECT REM PT RETURN 9FT ADLT (ELECTROSURGICAL) ×2
ELECTRODE REM PT RTRN 9FT ADLT (ELECTROSURGICAL) ×1 IMPLANT
GAUZE SPONGE 4X4 16PLY XRAY LF (GAUZE/BANDAGES/DRESSINGS) ×2 IMPLANT
GLOVE BIO SURGEON STRL SZ7 (GLOVE) ×2 IMPLANT
GLOVE BIOGEL PI IND STRL 7.0 (GLOVE) ×1 IMPLANT
GLOVE BIOGEL PI IND STRL 7.5 (GLOVE) ×1 IMPLANT
GLOVE BIOGEL PI INDICATOR 7.0 (GLOVE) ×1
GLOVE BIOGEL PI INDICATOR 7.5 (GLOVE) ×1
GOWN PREVENTION PLUS LG XLONG (DISPOSABLE) ×2 IMPLANT
GOWN PREVENTION PLUS XLARGE (GOWN DISPOSABLE) ×2 IMPLANT
GOWN STRL NON-REIN LRG LVL3 (GOWN DISPOSABLE) ×2 IMPLANT
GOWN STRL REIN XL XLG (GOWN DISPOSABLE) ×2 IMPLANT
KIT BASIN OR (CUSTOM PROCEDURE TRAY) ×2 IMPLANT
KIT MARKER MARGIN INK (KITS) ×1 IMPLANT
NDL HYPO 25X1 1.5 SAFETY (NEEDLE) ×1 IMPLANT
NEEDLE HYPO 22GX1.5 SAFETY (NEEDLE) IMPLANT
NEEDLE HYPO 25X1 1.5 SAFETY (NEEDLE) ×2 IMPLANT
PACK BASIC VI WITH GOWN DISP (CUSTOM PROCEDURE TRAY) ×2 IMPLANT
PEN SKIN MARKING BROAD (MISCELLANEOUS) ×2 IMPLANT
PENCIL BUTTON HOLSTER BLD 10FT (ELECTRODE) ×2 IMPLANT
SPONGE GAUZE 4X4 12PLY (GAUZE/BANDAGES/DRESSINGS) ×1 IMPLANT
STRIP CLOSURE SKIN 1/2X4 (GAUZE/BANDAGES/DRESSINGS) ×2 IMPLANT
SUT MNCRL AB 4-0 PS2 18 (SUTURE) ×2 IMPLANT
SUT VIC AB 2-0 SH 27 (SUTURE) ×4
SUT VIC AB 2-0 SH 27X BRD (SUTURE) ×1 IMPLANT
SUT VIC AB 3-0 SH 27 (SUTURE) ×4
SUT VIC AB 3-0 SH 27XBRD (SUTURE) ×1 IMPLANT
SYR BULB IRRIGATION 50ML (SYRINGE) ×1 IMPLANT
SYR CONTROL 10ML LL (SYRINGE) ×2 IMPLANT
TOWEL OR 17X26 10 PK STRL BLUE (TOWEL DISPOSABLE) ×2 IMPLANT
YANKAUER SUCT BULB TIP 10FT TU (MISCELLANEOUS) IMPLANT

## 2011-09-16 NOTE — Interval H&P Note (Signed)
History and Physical Interval Note:  09/16/2011 8:29 AM  [name]  has presented today for surgery, with the diagnosis of breast cancer   The various methods of treatment have been discussed with the patient and family. After consideration of risks, benefits and other options for treatment, the patient has consented to  Procedure(s): Re-excision lumpectomy as a surgical intervention .  The patients' history has been reviewed, patient examined, no change in status, stable for surgery.  I have reviewed the patients' chart and labs.  Questions were answered to the patient's satisfaction.     Zayed Griffie

## 2011-09-16 NOTE — Progress Notes (Signed)
Insulin pump running at 0.775 units per hour

## 2011-09-16 NOTE — Anesthesia Preprocedure Evaluation (Addendum)
Anesthesia Evaluation  Patient identified by MRN, date of birth, ID band Patient awake    Reviewed: Allergy & Precautions, H&P , NPO status , Patient's Chart, lab work & pertinent test results, reviewed documented beta blocker date and time   Airway Mallampati: II TM Distance: >3 FB Neck ROM: full    Dental  (+) Edentulous Upper, Edentulous Lower and Dental Advisory Given   Pulmonary neg pulmonary ROS,    Pulmonary exam normal       Cardiovascular hypertension, On Medications     Neuro/Psych Negative Neurological ROS  Negative Psych ROS   GI/Hepatic negative GI ROS, Neg liver ROS,   Endo/Other  Diabetes mellitus-, Well Controlled, Type 1, Insulin DependentHypothyroidism   Renal/GU negative Renal ROS  Genitourinary negative   Musculoskeletal   Abdominal   Peds  Hematology negative hematology ROS (+)   Anesthesia Other Findings See surgeon's H&P   Reproductive/Obstetrics negative OB ROS                          Anesthesia Physical Anesthesia Plan  ASA: III  Anesthesia Plan: General   Post-op Pain Management:    Induction:   Airway Management Planned: LMA  Additional Equipment:   Intra-op Plan:   Post-operative Plan:   Informed Consent: I have reviewed the patients History and Physical, chart, labs and discussed the procedure including the risks, benefits and alternatives for the proposed anesthesia with the patient or authorized representative who has indicated his/her understanding and acceptance.   Dental advisory given  Plan Discussed with: CRNA  Anesthesia Plan Comments:        Anesthesia Quick Evaluation

## 2011-09-16 NOTE — Progress Notes (Signed)
Spoke with patient (alone)t re: Dentures , She will not take them out till she gets to OR. Reminded patient I will not be responsible for dentures once she leaves me and she wears them to OR.

## 2011-09-16 NOTE — H&P (View-Only) (Signed)
Subjective:     Patient ID: [name], female   DOB: December 01, 1928, 78 y.o.   MRN: [id number]  HPI This is an 77 year old female who I met at the breast multidisciplinary clinic. I then took her to the operating room last week and did a left breast lumpectomy that included the nipple areolar complex due to the position as well as a sentinel node biopsy. Her pathology shows invasive grade 1 lobular carcinoma spanning 9 mm with lobular carcinoma in situ present. There is no lymphovascular invasion. Her medial and superior margins are focally positive. Her sentinel  node is negative. I excised some additional tissue which I called and listed in my op report as superior and deep margin. The pathology report says anterior and deep and this is incorrect. I will discuss this with pathology. She comes in today for a postop check as well as to discuss reexcision.  Review of Systems     Objective:   Physical Exam  Pulmonary/Chest:         Healing incisions without infection        Assessment:     S/p lump/sn with positive medial margin    Plan:     She needs her medial margin excised. We discussed the other option would be a mastectomy but I don't think she needs this at all. We discussed return to the operating room and clearing the medial margin. We discussed a small chance of having a continued positive margin requiring further surgery. She and her husband are both agreeable to this all plan on doing this on Wednesday.

## 2011-09-16 NOTE — Op Note (Signed)
Preoperative diagnosis: Stage I lobular carcinoma of the left breast status post lumpectomy with positive medial margin Postoperative diagnosis: Same as above Procedure: Re-excision of medial margin, left breast Surgeon: Dr. Harden Mo Specimens: Left breast medial margin marked with paint kit Anesthesia: Gen. Estimated blood loss: Minimal Drains: None Sponge and needle count correct x 2 at end of operation Disposition patient to recovery room in stable condition  Indications: This is a healthy 78 year old female who underwent excision of a retroareolar left breast cancer. Her medial margin is positive and I discussed re-excision lumpectomy with her.  Procedure: After informed consent was obtained the patient was taken to the operating room. I reviewed her pathology prior to beginning again. She was administered 1 g of intravenous cefazolin. Sequential compression devices were placed on her legs prior to induction. She was then placed under general anesthesia without complication. Her left breast was prepped and draped in the standard sterile surgical fashion. A surgical timeout was performed.  I reentered her incision from her central lumpectomy. I cut through all the layers of sutures I previously placed. I then excised the medial margin with cautery. I marked this with the paint kit in the appropriate fashion. I then obtained hemostasis. I closed this in several layers of 2-0 Vicryl and 3-0 Vicryl. Clips are in each cardinal position around the cavity right now as well as 2 in the deep position. I then closed the skin with 5-0 Monocryl. I then placed Dermabond and Steri-Strips over the skin. An ABD and then a breast binder were also placed. She tolerated this well and was transferred recovery in stable condition.

## 2011-09-16 NOTE — Progress Notes (Signed)
Ambulatory in hall and to BR tolerated well.

## 2011-09-16 NOTE — Transfer of Care (Signed)
Immediate Anesthesia Transfer of Care Note  Patient: [name]  Procedure(s) Performed:  RE-EXCISION OF BREAST CANCER,SUPERIOR MARGINS - re excision of left breast lumpectomy,medial  margins with paint kit breast binder and staging   Patient Location: PACU  Anesthesia Type: General  Level of Consciousness: awake and alert   Airway & Oxygen Therapy: Patient Spontanous Breathing  Post-op Assessment: Report given to PACU RN and Post -op Vital signs reviewed and stable  Post vital signs: Reviewed and stable  Complications: No apparent anesthesia complications

## 2011-09-16 NOTE — Anesthesia Postprocedure Evaluation (Signed)
Anesthesia Post Note  Patient: [name]  Procedure(s) Performed:  RE-EXCISION OF BREAST CANCER,SUPERIOR MARGINS - re excision of left breast lumpectomy,medial  margins with paint kit breast binder and staging   Anesthesia type: General  Patient location: PACU  Post pain: Pain level controlled  Post assessment: Post-op Vital signs reviewed  Last Vitals:  Filed Vitals:   09/16/11 1015  BP: 143/54  Pulse: 61  Temp:   Resp: 12    Post vital signs: Reviewed  Level of consciousness: sedated  Complications: No apparent anesthesia complications

## 2011-09-16 NOTE — Progress Notes (Signed)
CBG 198 patient to hood herself up to insulin pump and will set up at her previous pre-op perimeters.

## 2011-09-16 NOTE — Progress Notes (Signed)
Dr. Rica Mast made aware of patient's CBG  Results- 102 in PACU- to recheck CBG upon arrival to Short Stay and then let patient program  Insulin Pump to use

## 2011-09-18 ENCOUNTER — Telehealth (INDEPENDENT_AMBULATORY_CARE_PROVIDER_SITE_OTHER): Payer: Self-pay

## 2011-09-18 NOTE — Telephone Encounter (Signed)
Called pt to notify her of the path results. The path shows clear margins and nothing else needs to be done per Dr Dwain Sarna. The pt is c/o a itchy rash on chest with red bumps. The pt has only taken 1 hyrdrocodone since surgery. I advised pt that I would notify Dr Dwain Sarna and call her back.

## 2011-09-18 NOTE — Telephone Encounter (Signed)
Called pt back after speaking with Dr Dwain Sarna on the rash. He advised pt use OTC Benadryl cream and Benadryl talblet to help with the itchy rash. Pt understands.

## 2011-09-21 ENCOUNTER — Telehealth (INDEPENDENT_AMBULATORY_CARE_PROVIDER_SITE_OTHER): Payer: Self-pay

## 2011-09-21 NOTE — Telephone Encounter (Signed)
Called pt back after speaking with Dr Dwain Sarna and he advised the pt to take otc Tylenol and wait 24 to 48hrs to see if it's just a cold. The pt is ok with this order.

## 2011-09-21 NOTE — Telephone Encounter (Signed)
Returned pt's v.m. Message. The pt c/o nausea,headache,nasal congestion,dry cough, and sharp pain shooting to left breast. I asked the pt if she was taking any pain med. And she is not b/c last week she developed a rash from the medicine. The pt did run a low grade temp on Saturday of 99.4. The pt did have some nausea medication left over from her first sugery that she did take this medication to help with the nausea. I told the pt that I would call Dr Dwain Sarna to see his thoughts.

## 2011-09-24 ENCOUNTER — Encounter: Payer: Self-pay | Admitting: Oncology

## 2011-09-24 ENCOUNTER — Ambulatory Visit (INDEPENDENT_AMBULATORY_CARE_PROVIDER_SITE_OTHER): Payer: Medicare Other | Admitting: General Surgery

## 2011-09-24 ENCOUNTER — Ambulatory Visit (HOSPITAL_BASED_OUTPATIENT_CLINIC_OR_DEPARTMENT_OTHER): Payer: Medicare Other | Admitting: Oncology

## 2011-09-24 VITALS — BP 172/66 | HR 84 | Temp 98.5°F | Ht 67.0 in | Wt 141.9 lb

## 2011-09-24 VITALS — BP 124/82 | HR 78 | Resp 16 | Ht 67.0 in | Wt 140.0 lb

## 2011-09-24 DIAGNOSIS — Z09 Encounter for follow-up examination after completed treatment for conditions other than malignant neoplasm: Secondary | ICD-10-CM

## 2011-09-24 DIAGNOSIS — C50419 Malignant neoplasm of upper-outer quadrant of unspecified female breast: Secondary | ICD-10-CM

## 2011-09-24 MED ORDER — PROMETHAZINE HCL 12.5 MG PO TABS: 12.5000 mg | ORAL_TABLET | Freq: Four times a day (QID) | ORAL | Status: AC | PRN

## 2011-09-24 NOTE — Progress Notes (Signed)
OFFICE PROGRESS NOTE  CC Dr. Beather Arbour Dr. Antony Blackbird Dr. Alvie Heidelberg, MD, MD [facility] [place] [address]  DIAGNOSIS: 78 year old female Stage I invasive lobular carcinoma s/p central lumpectomy on 09/02/11  PRIOR THERAPY: 1. Status post lumpectomy on 09/02/2011 with the final pathology revealing an invasive grade 1 lobular carcinoma measuring 0.9 cm with lobular carcinoma in situ noted. Medial and superior margins were focally positive. Patient subsequently underwent additional excision of the superior and deep margins that showed benign breast parenchyma with fibrocystic changes no atypia hyperplasia or malignancy. One sentinel node was negative for metastatic disease.  CURRENT THERAPY:A she will be seen by Dr. Antony Blackbird for consideration of postlumpectomy radiation.  INTERVAL HISTORY: [name] 78 y.o. female returns for Follow up visit after her lumpectomy and re-excision. Her re-excision was done on 09/16/2011. Postoperatively she is doing well she does still have the Steri-Strips  In place. She had a central lumpectomy performed. Overall she tolerated the procedure well. She today denies any fevers chills night sweats headaches shortness of breath chest pains palpitations she does complain of a little bit of nausea and some epigastric distress. Remainder of the 10 point review of systems is negative.  MEDICAL HISTORY: Past Medical History  Diagnosis Date  . Thyroid disease     hypothyroidism  . Hypercholesteremia   . Hypertension   . Hypothyroidism   . Heart palpitations   . Diabetes mellitus     type I- wears insulin pump    ALLERGIES:  is allergic to augmentin; hydrocodone; and latex.  MEDICATIONS:  Current Outpatient Prescriptions  Medication Sig Dispense Refill  . ALPRAZolam (XANAX) 0.5 MG tablet Take 0.25 mg by mouth as needed. Anxiety       . Calcium Citrate-Vitamin D (CITRACAL + D PO) Take 1,000 mg by mouth daily.       . cholecalciferol (VITAMIN D) 1000 UNITS tablet Take 1,000 Units by mouth daily.      . hydrochlorothiazide (MICROZIDE) 12.5 MG capsule Take 12.5 mg by mouth daily before breakfast.       . insulin glargine (LANTUS SOLOSTAR) 100 UNIT/ML injection Inject 14 Units into the skin at bedtime as needed. Back up as if pump broke.      . Insulin Glulisine (APIDRA SOLOSTAR IJ) Inject 1 each as directed. Use for back up when pump is down      . Insulin Human (INSULIN PUMP) 100 unit/ml SOLN Inject 0.425-0.725 each into the skin every hour. Apidra (insulin glulisine)12 am-2 am 0.600unit; 3 am-12 pm  0.725 units; 12 pm- 7 pm 0.425 units; 7pm - 12 am 0.450 units. Total basel rate is for the day is 13.95 units      . levothyroxine (SYNTHROID, LEVOTHROID) 75 MCG tablet Take 75 mcg by mouth daily before breakfast.       . Multiple Vitamin (MULTIVITAMIN) capsule Take 2 capsules by mouth daily. Multi-betic (diabetic multivitamin and mineral)      . simvastatin (ZOCOR) 20 MG tablet Take 20 mg by mouth at bedtime.         SURGICAL HISTORY:  Past Surgical History  Procedure Date  . Bladder repair 5 yrs ago  . Tonsillectomy  age 70  . Appendectomy age 69  . Abdominal hysterectomy yrs ago  . Breast surgery  yrs ago    br bx  . Breast surgery Sep 02 2011    left central lumpectomy and snbx    REVIEW OF  SYSTEMS:  Pertinent items are noted in HPI.   PHYSICAL EXAMINATION: General appearance: alert, cooperative and appears stated age Neck: no adenopathy, no carotid bruit, no JVD, supple, symmetrical, trachea midline and thyroid not enlarged, symmetric, no tenderness/mass/nodules Resp: clear to auscultation bilaterally and normal percussion bilaterally Back: symmetric, no curvature. ROM normal. No CVA tenderness. Cardio: regular rate and rhythm, S1, S2 normal, no murmur, click, rub or gallop GI: soft, non-tender; bowel sounds normal; no masses,  no organomegaly Extremities: extremities normal, atraumatic, no  cyanosis or edema Neurologic: Alert and oriented X 3, normal strength and tone. Normal symmetric reflexes. Normal coordination and gait Breast Exam: left breast with central lumpectomy healing well, no other masses ECOG PERFORMANCE STATUS: 0 - Asymptomatic  Blood pressure 172/66, pulse 84, temperature 98.5 F (36.9 C), temperature source Oral, height 5\' 7"  (1.702 m), weight 141 lb 14.4 oz (64.365 kg).  LABORATORY DATA: Lab Results  Component Value Date   WBC 5.9 08/26/2011   HGB 12.7 08/26/2011   HCT 37.4 08/26/2011   MCV 88.0 08/26/2011   PLT 218 08/26/2011      Chemistry      Component Value Date/Time   NA 130* 08/31/2011 1523   K 4.6 08/31/2011 1523   CL 94* 08/31/2011 1523   CO2 29 08/31/2011 1523   BUN 14 08/31/2011 1523   CREATININE 0.62 08/31/2011 1523      Component Value Date/Time   CALCIUM 10.0 08/31/2011 1523   ALKPHOS 93 08/26/2011 1205   AST 29 08/26/2011 1205   ALT 15 08/26/2011 1205   BILITOT 0.3 08/26/2011 1205     REASON: [id number]: Following a conversation with Dr. Dwain Sarna at the Breast Conference on 09/16/11, specimen # 2 actually represents additional superior and deep margins. Part 2 specimen site changed from Breast, excision, additional anterior and deep to Breast, excision, additional superior and deep margins. 09/16/11 09:41:52 AM (gt) FINAL DIAGNOSIS Diagnosis 1. Breast, lumpectomy, Left - INVASIVE GRADE I LOBULAR CARCINOMA, SPANNING 0.9 CM. - LOBULAR CARCINOMA IN SITU PRESENT. - LYMPH/VASCULAR INVASION NOT IDENTIFIED. - MEDIAL AND SUPERIOR MARGINS ARE FOCALLY POSITIVE. - SEE ONCOLOGY TEMPLATE AND COMMENT. 2. Breast, excision, Additional superior and deep margins - BENIGN BREAST PARENCHYMA SHOWING FIBROCYSTIC CHANGES. - NO ATYPIA, HYPERPLASIA OR MALIGNANCY IDENTIFIED. 3. Lymph node, sentinel, biopsy, Left axilla - ONE BENIGN LYMPH NODE WITH NO TUMOR SEEN (0/1). - SEE COMMENT. Microscopic Comment 1. BREAST, INVASIVE TUMOR, WITH LYMPH NODE  SAMPLING Specimen, including laterality: Left breast lumpectomy with additional anterior and deep margin, resection and sentinel lymph node biopsy. Procedure: Left breast lumpectomy with additional anterior and deep margin, resection and sentinel lymph node biopsy. Grade: I Tubule formation: 3. Nuclear pleomorphism: 1. Mitotic:1. Tumor size (glass slide measurement): 0.9 cm. Margins: Invasive, distance to closest margin: Medial and superior margin is focally positive. Lymphovascular invasion: Not identified. 1 of 3 Amended copy Corrected FINAL for Glendenning, Meganne C ([id number]) Microscopic Comment(continued) Ductal carcinoma in situ: No. Lobular neoplasia: Yes, lobular carcinoma in situ present. Tumor focality: Unifocal. Treatment effect: Not applicable. Extent of tumor: Tumor involves breast parenchyma and deep dermis. Lymph nodes: # examined: .1 Lymph nodes with metastasis: 0. Breast prognostic profile: Performed on [id number] Estrogen receptor: 97%, positive. Progesterone receptor: 35%, positive. Her 2 neu: 1.03, not amplified. Ki-67: 9%, low. Non-neoplastic breast: A small amount of usual ductal hyperplasia is present with calcifications. Benign stromal and vascular calcifications are also present. TNM: pT1b, pN0, MX Comments: A cytokeratin AE1/AE3 stain is performed on two blocks (1B  and 1G) to determine the extent of the invasive lobular carcinoma. The stains confirm that the medial and superior margins are focally positive. Dr. Colonel Bald has seen this case in consultation with agreement of the positive margin status. A Her-2/neu by [facility] will be repeated on the current tumor. RAH:gt, 09/07/11) 3. Immunohistochemical staining is performed on the sentinel lymph node for cytokeratin AE1/AE3. The stain is negative confirming that there is no metastatic carcinoma. (RH:gt, 09/07/11) JOSHUA KISH MD   RADIOGRAPHIC STUDIES:  Nm Sentinel Node Inj-no Rpt (breast)  09/02/2011   CLINICAL DATA: Left breast cancer for left ax snbx   Sulfur colloid was injected intradermally by the nuclear medicine  technologist for breast cancer sentinel node localization.     Mm Breast Surgical Specimen  09/02/2011  *RADIOLOGY REPORT*  Clinical Data:  78 year old female with left breast cancer - wire localization prior to lumpectomy.  NEEDLE LOCALIZATION WITH MAMMOGRAPHIC GUIDANCE AND SPECIMEN RADIOGRAPH  Patient presents for needle localization prior to left lumpectomy. I met with the patient and we discussed the procedure of needle localization including benefits and alternatives. We discussed the high likelihood of a successful procedure. We discussed the risks of the procedure, including infection, bleeding, tissue injury, and further surgery. Informed, written consent was given.  Using mammographic guidance, sterile technique, 2% lidocaine and a 9 cm modified Kopans needle, the biopsy clip was localized using a superior approach.  The films are marked for Dr. Dwain Sarna.  Specimen radiograph was performed at day surgery, and confirms the wire and clip present in the tissue sample.  The specimen is marked for pathology.  IMPRESSION: Needle localization left breast.  No apparent complications.  Original Report Authenticated By: Rosendo Gros, M.D.   Mm Breast Wire Localization Left  09/02/2011  *RADIOLOGY REPORT*  Clinical Data:  78 year old female with left breast cancer - wire localization prior to lumpectomy.  NEEDLE LOCALIZATION WITH MAMMOGRAPHIC GUIDANCE AND SPECIMEN RADIOGRAPH  Patient presents for needle localization prior to left lumpectomy. I met with the patient and we discussed the procedure of needle localization including benefits and alternatives. We discussed the high likelihood of a successful procedure. We discussed the risks of the procedure, including infection, bleeding, tissue injury, and further surgery. Informed, written consent was given.  Using mammographic guidance, sterile  technique, 2% lidocaine and a 9 cm modified Kopans needle, the biopsy clip was localized using a superior approach.  The films are marked for Dr. Dwain Sarna.  Specimen radiograph was performed at day surgery, and confirms the wire and clip present in the tissue sample.  The specimen is marked for pathology.  IMPRESSION: Needle localization left breast.  No apparent complications.  Original Report Authenticated By: Rosendo Gros, M.D.    ASSESSMENT: 78 year old female with  #1 stage IA invasive lobular carcinoma of the left breast status post central lumpectomy the tumor was ER positive measuring 0.9 cm. PR +35% ER +97% HER-2/neu negative proliferation marker 9% and low.   PLAN:   #1 patient is seen today for discussion of adjuvant antiestrogen therapy. We discussed tamoxifen 20 mg daily. However patient is very reluctant to take this. She does have an appointment with Dr. Sharlett Iles scheduled next week she is going to discuss the possibility of radiation.  #2 I will plan on seeing the patient back sometime in April. She knows to call if she decides not to have radiation therapy then she certainly should be started on an antiestrogen.  #3 all questions were answered a followup appointment is  set up for her.   All questions were answered. The patient knows to call the clinic with any problems, questions or concerns. We can certainly see the patient much sooner if necessary.  I spent 25 minutes counseling the patient face to face. The total time spent in the appointment was 30 minutes.    Drue Second, MD Medical/Oncology [facility] [phone] (beeper) [phone] (Office)  09/24/2011, 2:58 PM

## 2011-09-24 NOTE — Progress Notes (Signed)
Subjective:     Patient ID: [name], female   DOB: 01-Mar-1929, 78 y.o.   MRN: [id number]  HPI 67 yof who underwent central lumpectomy , snbx for T1b lobular carcinoma that was node negative.  Her medial margin was positive and I returned her to OR with finding of only ADH at margin.  She returns today complaining only of some epigastric burning and some nausea.  No complaints after surgical site.  Review of Systems     Objective:   Physical Exam  Pulmonary/Chest:         Assessment:     Stage I left breast cancer s/p lumpectomy with negative margin    Plan:     She is clear to proceed with next step.  She has discussed with Dr. Welton Flakes and sees Dr. Roselind Messier tomorrow.   I told her to try some prilosec for the epigastric burning and I did give her some phenergan for nausea.  I asked her to contact her pcp also.

## 2011-09-25 ENCOUNTER — Ambulatory Visit: Payer: Medicare Other | Admitting: Oncology

## 2011-09-25 ENCOUNTER — Telehealth: Payer: Self-pay | Admitting: Oncology

## 2011-09-25 NOTE — Telephone Encounter (Signed)
lmonvm adviisng the pt of her 2 mnth f/u appt in April 2013

## 2011-09-30 ENCOUNTER — Ambulatory Visit
Admission: RE | Admit: 2011-09-30 | Discharge: 2011-09-30 | Disposition: A | Discharge status: Home / Self Care | Payer: Medicare Other | Source: Ambulatory Visit | Attending: Radiation Oncology | Admitting: Radiation Oncology

## 2011-09-30 ENCOUNTER — Encounter: Payer: Self-pay | Admitting: Radiation Oncology

## 2011-09-30 VITALS — BP 169/75 | HR 85 | Temp 97.5°F | Resp 18 | Ht 67.0 in | Wt 144.3 lb

## 2011-09-30 DIAGNOSIS — C50419 Malignant neoplasm of upper-outer quadrant of unspecified female breast: Secondary | ICD-10-CM

## 2011-09-30 NOTE — Progress Notes (Signed)
CC:   Cheryl Gosling, MD [name], M.D. [name], M.D.  DIAGNOSIS:  Invasive lobular carcinoma of the left breast, pT1b pN0.  REQUESTING PHYSICIAN:  Dr. Emelia Loron  HISTORY OF PRESENT ILLNESS:  [name] comes in today for further evaluation.  She was seen by myself in the Multidisciplinary Breast Clinic on August 26, 2011.  Since that time, the patient proceeded to undergo her definitive surgery.  Patient did undergo central lumpectomy with removal of the nipple-areolar complex in light of the location. Upon pathologic review, the patient was found have an invasive lobular carcinoma extending over 0.9 cm.  There was some in situ lobular present.  The medial and superior margins were focally positive.  One benign lymph node was recovered from the left axilla.  The tumor was estrogen receptor positive at 97% and progesterone receptor positive at 35%.  Given the positive surgical margin on the primary site, the patient did undergo re-excision on September 16, 2011.  The patient was found to have some atypical ductal hyperplasia with calcifications within the re-excision specimen, but no signs of lobular carcinoma or in situ lobular carcinoma.  Postoperatively, the patient has been doing well.  She has had some fatigue.  The patient is now seen for further evaluation and consideration for radiation treatments.  PHYSICAL EXAMINATION:  General:  A very pleasant 78 year old female who appears younger than her stated age.  Vital Signs:  Temperature is 97.5, pulse 85, blood pressure is 169/75.  Weight is 144 pounds.  Lymphatic: Examination of the neck and supraclavicular region reveals no evidence of adenopathy.  The axillary areas are free of adenopathy.  Examination of the lungs reveals them to be clear.  Heart:  Regular rhythm and rate. Examination of the left breast reveals a horizontal scar from her central lumpectomy.  The nipple-areolar area is surgically  absent.  The patient's breast is healing well without signs of drainage or infection at this time.  The patient has a small scar in the axillary region from her sentinel node procedure which is also healing well.  IMPRESSION AND PLAN:  Invasive lobular carcinoma of the left breast, stage I.  I discussed options for treatment with [name] including adjuvant hormonal therapy or adjuvant radiation therapy.  [name] was contemplating not proceeding with either treatment, but I strongly encouraged her to consider one of these options to reduce the chances of recurrence.  After further consideration, [name] does wish to proceed with adjuvant hormonal therapy and will be seeing Dr. Welton Flakes in the next few weeks to begin hormonal therapy.  The patient would like to follow up in Radiation Oncology 1 more time in 6 months and this will be scheduled for her.    ______________________________ Billie Lade, Ph.D., M.D. JDK/MEDQ  D:  09/30/2011  T:  09/30/2011  Job:  [id number]

## 2011-09-30 NOTE — Progress Notes (Signed)
Patient presents to the clinic today unaccompanied for consultation with Dr. Roselind Messier. Patient is alert and oriented to person, place, and time. No distress noted. Steady gait noted. Pleasant affect noted. Patient reports nausea and vomiting only following surgery. Patient reports this nausea and vomiting has since resolved. Patient reports "a little bit of constipation" but, taking Miralax. Dizziness only for one day following surgery. Scheduled to follow up with Dr. Dwain Sarna in six months. Today this patient is two weeks post op second breast surgery. Patient reports "sticky" left breast and axilla pain 5 on a scale of 0-10 related to the effects of surgery. Occasionally patient reports taking two otc tylenol to relieve this pain. Reported all findings to Dr. Roselind Messier.

## 2011-09-30 NOTE — Progress Notes (Signed)
Please see the Nurse Progress Note in the MD Initial Consult Encounter for this patient. 

## 2011-09-30 NOTE — Progress Notes (Signed)
Complete PATIENT MEASURE OF DISTRESS with a score of 9 submitted to social work. Patient declined need to see social work today. Also, complete NUTRITION RISK SCREEN worksheet without concerns submitted to Zenovia Jarred, RD.

## 2011-10-07 ENCOUNTER — Telehealth (INDEPENDENT_AMBULATORY_CARE_PROVIDER_SITE_OTHER): Payer: Self-pay

## 2011-10-07 NOTE — Telephone Encounter (Signed)
Called pt at home but left message with her husband. I notified her that we did fax a script over to the [facility] for her to attend the exercises [phone].

## 2011-10-16 ENCOUNTER — Ambulatory Visit (INDEPENDENT_AMBULATORY_CARE_PROVIDER_SITE_OTHER): Payer: Medicare Other | Admitting: General Surgery

## 2011-10-16 ENCOUNTER — Encounter (INDEPENDENT_AMBULATORY_CARE_PROVIDER_SITE_OTHER): Payer: Self-pay | Admitting: General Surgery

## 2011-10-16 VITALS — BP 162/82 | HR 70 | Resp 16 | Ht 67.0 in | Wt 139.0 lb

## 2011-10-16 DIAGNOSIS — Z09 Encounter for follow-up examination after completed treatment for conditions other than malignant neoplasm: Secondary | ICD-10-CM

## 2011-10-16 DIAGNOSIS — C50419 Malignant neoplasm of upper-outer quadrant of unspecified female breast: Secondary | ICD-10-CM

## 2011-10-16 NOTE — Progress Notes (Signed)
Subjective:     Patient ID: [name], female   DOB: 1928-08-22, 78 y.o.   MRN: [id number]  HPI This is an 78 year old female who eventually underwent a central left breast lumpectomy and a left axillary central node biopsy for a stage I A left breast cancer. She's been doing well and has been considering what adjuvant therapy to begin. She is now considering either antiestrogen therapy or radiation therapy. It appears that she would like to consider antiestrogen therapy and I will let medical oncology know that as well. She comes in today for some pain at the lateral aspect of her left breast incision as well as some bruising that she is concerned about today. She otherwise is doing very well.  Review of Systems     Objective:   Physical Exam    left breast incision clean without infection, small amount of bruising laterally, no real seroma, axillary incision healed Assessment:     S/p bct    Plan:     I reassured her this was all normal and should resolve. I will let Dr. Welton Flakes know that she is considering antiestrogen therapy I will see her back in 4-6 weeks just to make sure she is healing

## 2011-10-19 ENCOUNTER — Telehealth: Payer: Self-pay | Admitting: Oncology

## 2011-10-19 NOTE — Telephone Encounter (Signed)
lmonvm  adviisng the pt of her appts that  Has been moved from April to march due to dr Milta Deiters request.

## 2011-10-30 ENCOUNTER — Ambulatory Visit (HOSPITAL_BASED_OUTPATIENT_CLINIC_OR_DEPARTMENT_OTHER): Payer: Medicare Other | Admitting: Lab

## 2011-10-30 ENCOUNTER — Ambulatory Visit (HOSPITAL_BASED_OUTPATIENT_CLINIC_OR_DEPARTMENT_OTHER): Payer: Medicare Other | Admitting: Oncology

## 2011-10-30 ENCOUNTER — Telehealth: Payer: Self-pay | Admitting: *Deleted

## 2011-10-30 ENCOUNTER — Encounter: Payer: Self-pay | Admitting: Oncology

## 2011-10-30 VITALS — BP 171/69 | HR 69 | Temp 98.5°F | Ht 67.0 in | Wt 146.1 lb

## 2011-10-30 DIAGNOSIS — K219 Gastro-esophageal reflux disease without esophagitis: Secondary | ICD-10-CM

## 2011-10-30 DIAGNOSIS — E559 Vitamin D deficiency, unspecified: Secondary | ICD-10-CM

## 2011-10-30 DIAGNOSIS — C50919 Malignant neoplasm of unspecified site of unspecified female breast: Secondary | ICD-10-CM

## 2011-10-30 DIAGNOSIS — C50419 Malignant neoplasm of upper-outer quadrant of unspecified female breast: Secondary | ICD-10-CM

## 2011-10-30 DIAGNOSIS — Z170 Estrogen receptor positive status [ER+]: Secondary | ICD-10-CM

## 2011-10-30 LAB — CBC WITH DIFFERENTIAL/PLATELET
EOS%: 1.7 % (ref 0.0–7.0)
Eosinophils Absolute: 0.1 10*3/uL (ref 0.0–0.5)
MCV: 86.7 fL (ref 79.5–101.0)
MONO%: 12 % (ref 0.0–14.0)
NEUT#: 3.5 10*3/uL (ref 1.5–6.5)
RBC: 4.09 10*6/uL (ref 3.70–5.45)
RDW: 13.1 % (ref 11.2–14.5)

## 2011-10-30 LAB — COMPREHENSIVE METABOLIC PANEL
Albumin: 4.4 g/dL (ref 3.5–5.2)
Alkaline Phosphatase: 94 U/L (ref 39–117)
CO2: 26 mEq/L (ref 19–32)
Glucose, Bld: 90 mg/dL (ref 70–99)
Potassium: 4 mEq/L (ref 3.5–5.3)
Sodium: 133 mEq/L — ABNORMAL LOW (ref 135–145)
Total Protein: 7.2 g/dL (ref 6.0–8.3)

## 2011-10-30 MED ORDER — ESOMEPRAZOLE MAGNESIUM 40 MG PO CPDR: 40.0000 mg | DELAYED_RELEASE_CAPSULE | Freq: Every day | ORAL | Status: DC

## 2011-10-30 MED ORDER — TAMOXIFEN CITRATE 20 MG PO TABS: 20.0000 mg | ORAL_TABLET | Freq: Every day | ORAL | Status: AC

## 2011-10-30 NOTE — Patient Instructions (Signed)
1. You will begin tamoxifen as discussed  Tamoxifen oral tablet What is this medicine? TAMOXIFEN (ta MOX i fen) blocks the effects of estrogen. It is commonly used to treat breast cancer. It is also used to decrease the chance of breast cancer coming back in women who have received treatment for the disease. It may also help prevent breast cancer in women who have a high risk of developing breast cancer. This medicine may be used for other purposes; ask your health care provider or pharmacist if you have questions. What should I tell my health care provider before I take this medicine? They need to know if you have any of these conditions: -blood clots -blood disease -cataracts or impaired eyesight -endometriosis -high calcium levels -high cholesterol -irregular menstrual cycles -liver disease -stroke -uterine fibroids -an unusual or allergic reaction to tamoxifen, other medicines, foods, dyes, or preservatives -pregnant or trying to get pregnant -breast-feeding How should I use this medicine? Take this medicine by mouth with a glass of water. Follow the directions on the prescription label. You can take it with or without food. Take your medicine at regular intervals. Do not take your medicine more often than directed. Do not stop taking except on your doctor's advice. A special MedGuide will be given to you by the pharmacist with each prescription and refill. Be sure to read this information carefully each time. Talk to your pediatrician regarding the use of this medicine in children. While this drug may be prescribed for selected conditions, precautions do apply. Overdosage: If you think you have taken too much of this medicine contact a poison control center or emergency room at once. NOTE: This medicine is only for you. Do not share this medicine with others. What if I miss a dose? If you miss a dose, take it as soon as you can. If it is almost time for your next dose, take only that  dose. Do not take double or extra doses. What may interact with this medicine? -aminoglutethimide -bromocriptine -chemotherapy drugs -female hormones, like estrogens and birth control pills -letrozole -medroxyprogesterone -phenobarbital -rifampin -warfarin This list may not describe all possible interactions. Give your health care provider a list of all the medicines, herbs, non-prescription drugs, or dietary supplements you use. Also tell them if you smoke, drink alcohol, or use illegal drugs. Some items may interact with your medicine. What should I watch for while using this medicine? Visit your doctor or health care professional for regular checks on your progress. You will need regular pelvic exams, breast exams, and mammograms. If you are taking this medicine to reduce your risk of getting breast cancer, you should know that this medicine does not prevent all types of breast cancer. If breast cancer or other problems occur, there is no guarantee that it will be found at an early stage. Do not become pregnant while taking this medicine or for 2 months after stopping this medicine. Stop taking this medicine if you get pregnant or think you are pregnant and contact your doctor. This medicine may harm your unborn baby. Women who can possibly become pregnant should use birth control methods that do not use hormones during tamoxifen treatment and for 2 months after therapy has stopped. Talk with your health care provider for birth control advice. Do not breast feed while taking this medicine. What side effects may I notice from receiving this medicine? Side effects that you should report to your doctor or health care professional as soon as possible: -changes in  vision (blurred vision) -changes in your menstrual cycle -difficulty breathing or shortness of breath -difficulty walking or talking -new breast lumps -numbness -pelvic pain or pressure -redness, blistering, peeling or loosening of  the skin, including inside the mouth -skin rash or itching (hives) -sudden chest pain -swelling of lips, face, or tongue -swelling, pain or tenderness in your calf or leg -unusual bruising or bleeding -vaginal discharge that is bloody, brown, or rust -weakness -yellowing of the whites of the eyes or skin Side effects that usually do not require medical attention (report to your doctor or health care professional if they continue or are bothersome): -fatigue -hair loss, although uncommon and is usually mild -headache -hot flashes -impotence (in men) -nausea, vomiting (mild) -vaginal discharge (white or clear) This list may not describe all possible side effects. Call your doctor for medical advice about side effects. You may report side effects to FDA at 1-800-FDA-1088. Where should I keep my medicine? Keep out of the reach of children. Store at room temperature between 20 and 25 degrees C (68 and 77 degrees F). Protect from light. Keep container tightly closed. Throw away any unused medicine after the expiration date. NOTE: This sheet is a summary. It may not cover all possible information. If you have questions about this medicine, talk to your doctor, pharmacist, or health care provider.  2012, Elsevier/Gold Standard. (04/12/2008 12:01:56 PM)  2. GERD: sent prescription for nexium to see if the cough is coming from the reflux. If this does not improve please you PCP.  3. I have referred you for a new PCP since your prior PCP has retired.  4. I will see you back in May 2013

## 2011-10-30 NOTE — Telephone Encounter (Signed)
gave patient appointment for 12-2011 sent patient back to the labs on 10-30-2011 per doctor's orders on 10-30-2011

## 2011-11-02 ENCOUNTER — Telehealth: Payer: Self-pay | Admitting: *Deleted

## 2011-11-02 NOTE — Telephone Encounter (Signed)
made patient appointment with dr.valerie leschber at [facility] [place] on 02-09-2012 at 9:30am

## 2011-11-09 NOTE — Progress Notes (Signed)
OFFICE PROGRESS NOTE  CC Dr. Beather Arbour Dr. Antony Blackbird Dr. Alvie Heidelberg, MD, MD [facility] [place] [address]  DIAGNOSIS: 78 year old female Stage I invasive lobular carcinoma s/p central lumpectomy on 09/02/11  PRIOR THERAPY: 1. Status post lumpectomy on 09/02/2011 with the final pathology revealing an invasive grade 1 lobular carcinoma measuring 0.9 cm with lobular carcinoma in situ noted. Medial and superior margins were focally positive. Patient subsequently underwent additional excision of the superior and deep margins that showed benign breast parenchyma with fibrocystic changes no atypia hyperplasia or malignancy. One sentinel node was negative for metastatic disease.  2. Patient will not receive radiation   CURRENT THERAPY:To begin tamoxifen 20 mg daily  INTERVAL HISTORY: [name] 78 y.o. female returns for Follow up visit . Overall she is doing well. After an extensive discussion with Dr. Roselind Messier patient has decided against radiation. She wouldlike to start her adjuvant hormonal agent. She and I had discussed the different agents and due to her arthritis it was felt that tamoxifen may be a better choice. She is without any complaints. Remainder of the 10 point review of systems is negative.  MEDICAL HISTORY: Past Medical History  Diagnosis Date  . Thyroid disease     hypothyroidism  . Hypercholesteremia   . Hypertension   . Hypothyroidism   . Heart palpitations   . Diabetes mellitus     type I- wears insulin pump  . Breast cancer     ALLERGIES:  is allergic to augmentin; hydrocodone; and latex.  MEDICATIONS:  Current Outpatient Prescriptions  Medication Sig Dispense Refill  . ALPRAZolam (XANAX) 0.5 MG tablet Take 0.25 mg by mouth as needed. Anxiety       . Calcium Citrate-Vitamin D (CITRACAL + D PO) Take 1,000 mg by mouth daily.      . cholecalciferol (VITAMIN D) 1000 UNITS tablet Take 1,000 Units by mouth daily.      .  fish oil-omega-3 fatty acids 1000 MG capsule Take 2 g by mouth daily.      . hydrochlorothiazide (MICROZIDE) 12.5 MG capsule Take 12.5 mg by mouth daily before breakfast.       . insulin glargine (LANTUS SOLOSTAR) 100 UNIT/ML injection Inject 14 Units into the skin at bedtime as needed. Back up as if pump broke.      . Insulin Glulisine (APIDRA SOLOSTAR IJ) Inject 1 each as directed. Use for back up when pump is down      . Insulin Human (INSULIN PUMP) 100 unit/ml SOLN Inject 0.425-0.725 each into the skin every hour. Apidra (insulin glulisine)12 am-2 am 0.600unit; 3 am-12 pm  0.725 units; 12 pm- 7 pm 0.425 units; 7pm - 12 am 0.450 units. Total basel rate is for the day is 13.95 units      . meperidine (DEMEROL) 50 MG tablet       . Multiple Vitamins-Minerals (MULTI-BETIC DIABETES PO) Take by mouth.      [name] aspirin 81 MG tablet Take 81 mg by mouth daily.      [name] azithromycin (ZITHROMAX) 250 MG tablet       . cephALEXin (KEFLEX) 250 MG capsule       . chlorpheniramine-HYDROcodone (TUSSIONEX) 10-8 MG/5ML LQCR       . esomeprazole (NEXIUM) 40 MG capsule Take 1 capsule (40 mg total) by mouth daily before breakfast.  30 capsule  6  . simvastatin (ZOCOR) 20 MG tablet Take 20 mg by mouth at bedtime.       [name]  tamoxifen (NOLVADEX) 20 MG tablet Take 1 tablet (20 mg total) by mouth daily.  90 tablet  12  . DISCONTD: levothyroxine (SYNTHROID, LEVOTHROID) 75 MCG tablet Take 75 mcg by mouth daily before breakfast.         SURGICAL HISTORY:  Past Surgical History  Procedure Date  . Bladder repair 5 yrs ago  . Tonsillectomy  age 11  . Appendectomy age 56  . Abdominal hysterectomy yrs ago  . Breast surgery  yrs ago    br bx  . Breast surgery Sep 02 2011    left central lumpectomy and snbx    REVIEW OF SYSTEMS:  Pertinent items are noted in HPI.   PHYSICAL EXAMINATION: General appearance: alert, cooperative and appears stated age Neck: no adenopathy, no carotid bruit, no JVD, supple, symmetrical,  trachea midline and thyroid not enlarged, symmetric, no tenderness/mass/nodules Resp: clear to auscultation bilaterally and normal percussion bilaterally Back: symmetric, no curvature. ROM normal. No CVA tenderness. Cardio: regular rate and rhythm, S1, S2 normal, no murmur, click, rub or gallop GI: soft, non-tender; bowel sounds normal; no masses,  no organomegaly Extremities: extremities normal, atraumatic, no cyanosis or edema Neurologic: Alert and oriented X 3, normal strength and tone. Normal symmetric reflexes. Normal coordination and gait Breast Exam: left breast with central lumpectomy healing well, no other masses ECOG PERFORMANCE STATUS: 0 - Asymptomatic  Blood pressure 171/69, pulse 69, temperature 98.5 F (36.9 Foster), temperature source Oral, height 5\' 7"  (1.702 m), weight 146 lb 1.6 oz (66.271 kg).  LABORATORY DATA: Lab Results  Component Value Date   WBC 6.1 10/30/2011   HGB 12.2 10/30/2011   HCT 35.5 10/30/2011   MCV 86.7 10/30/2011   PLT 206 10/30/2011      Chemistry      Component Value Date/Time   NA 133* 10/30/2011 1332   K 4.0 10/30/2011 1332   CL 98 10/30/2011 1332   CO2 26 10/30/2011 1332   BUN 16 10/30/2011 1332   CREATININE 0.73 10/30/2011 1332      Component Value Date/Time   CALCIUM 9.9 10/30/2011 1332   ALKPHOS 94 10/30/2011 1332   AST 30 10/30/2011 1332   ALT 16 10/30/2011 1332   BILITOT 0.5 10/30/2011 1332     REASON: [id number]: Following a conversation with Dr. Dwain Sarna at the Breast Conference on 09/16/11, specimen # 2 actually represents additional superior and deep margins. Part 2 specimen site changed from Breast, excision, additional anterior and deep to Breast, excision, additional superior and deep margins. 09/16/11 09:41:52 AM (gt) FINAL DIAGNOSIS Diagnosis 1. Breast, lumpectomy, Left - INVASIVE GRADE I LOBULAR CARCINOMA, SPANNING 0.9 CM. - LOBULAR CARCINOMA IN SITU PRESENT. - LYMPH/VASCULAR INVASION NOT IDENTIFIED. - MEDIAL AND SUPERIOR  MARGINS ARE FOCALLY POSITIVE. - SEE ONCOLOGY TEMPLATE AND COMMENT. 2. Breast, excision, Additional superior and deep margins - BENIGN BREAST PARENCHYMA SHOWING FIBROCYSTIC CHANGES. - NO ATYPIA, HYPERPLASIA OR MALIGNANCY IDENTIFIED. 3. Lymph node, sentinel, biopsy, Left axilla - ONE BENIGN LYMPH NODE WITH NO TUMOR SEEN (0/1). - SEE COMMENT. Microscopic Comment 1. BREAST, INVASIVE TUMOR, WITH LYMPH NODE SAMPLING Specimen, including laterality: Left breast lumpectomy with additional anterior and deep margin, resection and sentinel lymph node biopsy. Procedure: Left breast lumpectomy with additional anterior and deep margin, resection and sentinel lymph node biopsy. Grade: I Tubule formation: 3. Nuclear pleomorphism: 1. Mitotic:1. Tumor size (glass slide measurement): 0.9 cm. Margins: Invasive, distance to closest margin: Medial and superior margin is focally positive. Lymphovascular invasion: Not identified. 1 of 3  Amended copy Corrected FINAL for [name], [name] [phone]) Microscopic Comment(continued) Ductal carcinoma in situ: No. Lobular neoplasia: Yes, lobular carcinoma in situ present. Tumor focality: Unifocal. Treatment effect: Not applicable. Extent of tumor: Tumor involves breast parenchyma and deep dermis. Lymph nodes: # examined: .1 Lymph nodes with metastasis: 0. Breast prognostic profile: Performed on [id number] Estrogen receptor: 97%, positive. Progesterone receptor: 35%, positive. Her 2 neu: 1.03, not amplified. Ki-67: 9%, low. Non-neoplastic breast: A small amount of usual ductal hyperplasia is present with calcifications. Benign stromal and vascular calcifications are also present. TNM: pT1b, pN0, MX Comments: A cytokeratin AE1/AE3 stain is performed on two blocks (1B and 1G) to determine the extent of the invasive lobular carcinoma. The stains confirm that the medial and superior margins are focally positive. Dr. Colonel Bald has seen this case in  consultation with agreement of the positive margin status. A Her-2/neu by [facility] will be repeated on the current tumor. RAH:gt, 09/07/11) 3. Immunohistochemical staining is performed on the sentinel lymph node for cytokeratin AE1/AE3. The stain is negative confirming that there is no metastatic carcinoma. (RH:gt, 09/07/11) Pecola Leisure MD   ASSESSMENT: 78 year old female with  #1 stage IA invasive lobular carcinoma of the left breast status post central lumpectomy the tumor was ER positive measuring 0.9 cm. PR +35% ER +97% HER-2/neu negative proliferation marker 9% and low.   PLAN:   #1 patient is seen today for discussion of adjuvant antiestrogen therapy. We discussed tamoxifen 20 mg daily. And a prescription was sent to her pharmacy. Risks and benefits were clearly discussed with her.  2. GERD: sent prescription for nexium to see if the cough is coming from the reflux. If this does not improve please you PCP.  3. I have referred you for a new PCP since your prior PCP has retired.  4. I will see you back in May 2013  All questions were answered. The patient knows to call the clinic with any problems, questions or concerns. We can certainly see the patient much sooner if necessary.  I spent 25 minutes counseling the patient face to face. The total time spent in the appointment was 30 minutes.    Drue Second, MD Medical/Oncology [facility] [phone] (beeper) [phone] (Office)  11/09/2011, 7:17 AM

## 2011-11-10 DIAGNOSIS — C50919 Malignant neoplasm of unspecified site of unspecified female breast: Secondary | ICD-10-CM | POA: Insufficient documentation

## 2011-11-18 ENCOUNTER — Encounter: Payer: Self-pay | Admitting: *Deleted

## 2011-11-18 NOTE — Progress Notes (Signed)
[facility] Psychosocial Distress Screening Clinical Social Work  Clinical Social Work was referred by distress screening protocol.  The patient scored a 5 on the Psychosocial Distress Thermometer which indicates moderate distress. Clinical Social Worker contacted the patient to assess for distress and other psychosocial needs. [name] states she has not begun her tamoxifen yet because she acquired pneumonia.  She hopes to begin tamoxifen soon and plans to meet with Dr. Welton Flakes again. The patient reports that she has a large support system including her spouse, children who live in the area, and friends. She appreciated CSW phone call and plans to contact CSW if any needs arise.   Clinical Social Worker follow up needed: no  If yes, follow up plan:  [name], MSW, [facility] Clinical Social Worker [facility] [phone]

## 2011-11-27 ENCOUNTER — Ambulatory Visit: Payer: Medicare Other | Admitting: Oncology

## 2011-12-04 ENCOUNTER — Telehealth (INDEPENDENT_AMBULATORY_CARE_PROVIDER_SITE_OTHER): Payer: Self-pay

## 2011-12-04 NOTE — Telephone Encounter (Signed)
LMOM for pt to call me b/c I need to r/s her appt from 5/2 to another appt per Dr Doreen Salvage request.

## 2011-12-10 ENCOUNTER — Encounter (INDEPENDENT_AMBULATORY_CARE_PROVIDER_SITE_OTHER): Payer: Medicare Other | Admitting: General Surgery

## 2011-12-28 ENCOUNTER — Encounter (INDEPENDENT_AMBULATORY_CARE_PROVIDER_SITE_OTHER): Payer: Self-pay | Admitting: General Surgery

## 2011-12-28 ENCOUNTER — Ambulatory Visit (INDEPENDENT_AMBULATORY_CARE_PROVIDER_SITE_OTHER): Payer: Medicare Other | Admitting: General Surgery

## 2011-12-28 VITALS — BP 160/86 | HR 70 | Temp 97.4°F | Resp 14 | Ht 67.0 in | Wt 146.5 lb

## 2011-12-28 DIAGNOSIS — Z853 Personal history of malignant neoplasm of breast: Secondary | ICD-10-CM

## 2011-12-28 NOTE — Patient Instructions (Signed)

## 2011-12-29 ENCOUNTER — Encounter (INDEPENDENT_AMBULATORY_CARE_PROVIDER_SITE_OTHER): Payer: Self-pay | Admitting: General Surgery

## 2011-12-29 NOTE — Progress Notes (Signed)
Subjective:     Patient ID: [name], female   DOB: 1929-01-10, 78 y.o.   MRN: [id number]  HPI 55 yof s/p left central lumpectomy and snbx (with a re-excision for margins) of a stage I lobular carcinoma.  She has decided to undergo antiestrogen therapy but not radiation therapy.  She has begun tamoxifen and complains of some "bone pain" and some heartburn now.  She is otherwise well except she is concerned about some pain in her left UOQ into her axilla.  This is with activity and she thinks is related to some things she has been doing lately.  She comes back in today for an exam of this area.  Review of Systems     Objective:   Physical Exam  Pulmonary/Chest: Right breast exhibits no inverted nipple, no mass, no nipple discharge, no skin change and no tenderness. Left breast exhibits tenderness. Left breast exhibits no mass and no skin change.    Lymphadenopathy:    She has no cervical adenopathy.    She has no axillary adenopathy.       Right: No supraclavicular adenopathy present.       Left: No supraclavicular adenopathy present.       Assessment:     History breast cancer    Plan:     I think she has expected postsurgical issues and has increased her activity somewhat.  There is nothing on her exam that is concerning at all.  I will have her come back in a couple months to examine again though.  She will continue own exams, continue tamoxifen, get mmg at end of this year.

## 2011-12-31 ENCOUNTER — Telehealth: Payer: Self-pay | Admitting: Oncology

## 2011-12-31 ENCOUNTER — Encounter: Payer: Self-pay | Admitting: Oncology

## 2011-12-31 ENCOUNTER — Ambulatory Visit (HOSPITAL_BASED_OUTPATIENT_CLINIC_OR_DEPARTMENT_OTHER): Payer: Medicare Other | Admitting: Oncology

## 2011-12-31 ENCOUNTER — Other Ambulatory Visit (HOSPITAL_BASED_OUTPATIENT_CLINIC_OR_DEPARTMENT_OTHER): Payer: Medicare Other | Admitting: Lab

## 2011-12-31 VITALS — BP 160/73 | HR 71 | Temp 98.8°F | Ht 67.0 in | Wt 146.9 lb

## 2011-12-31 DIAGNOSIS — E559 Vitamin D deficiency, unspecified: Secondary | ICD-10-CM

## 2011-12-31 DIAGNOSIS — C50419 Malignant neoplasm of upper-outer quadrant of unspecified female breast: Secondary | ICD-10-CM

## 2011-12-31 DIAGNOSIS — Z79810 Long term (current) use of selective estrogen receptor modulators (SERMs): Secondary | ICD-10-CM

## 2011-12-31 DIAGNOSIS — Z170 Estrogen receptor positive status [ER+]: Secondary | ICD-10-CM

## 2011-12-31 DIAGNOSIS — M2550 Pain in unspecified joint: Secondary | ICD-10-CM

## 2011-12-31 DIAGNOSIS — C50919 Malignant neoplasm of unspecified site of unspecified female breast: Secondary | ICD-10-CM

## 2011-12-31 LAB — CBC WITH DIFFERENTIAL/PLATELET
Basophils Absolute: 0 10*3/uL (ref 0.0–0.1)
EOS%: 2.9 % (ref 0.0–7.0)
Eosinophils Absolute: 0.2 10*3/uL (ref 0.0–0.5)
HCT: 32.7 % — ABNORMAL LOW (ref 34.8–46.6)
HGB: 11.3 g/dL — ABNORMAL LOW (ref 11.6–15.9)
MCH: 28.2 pg (ref 25.1–34.0)
MONO#: 0.6 10*3/uL (ref 0.1–0.9)
NEUT#: 3.9 10*3/uL (ref 1.5–6.5)
NEUT%: 60 % (ref 38.4–76.8)
RDW: 13.7 % (ref 11.2–14.5)
WBC: 6.6 10*3/uL (ref 3.9–10.3)
lymph#: 1.8 10*3/uL (ref 0.9–3.3)

## 2011-12-31 NOTE — Patient Instructions (Signed)
1. Continue tamoxifen daily  2. Ask Dr. Nicholas Lose about prolia or reclast for the bones  3. i will see you back in 6 months

## 2011-12-31 NOTE — Telephone Encounter (Signed)
gve the pt her nov 2013 appt calendar °

## 2011-12-31 NOTE — Progress Notes (Signed)
OFFICE PROGRESS NOTE  CC Dr. Beather Arbour Dr. Antony Blackbird Dr. Alvie Heidelberg, MD, MD [facility] [place] [address]  DIAGNOSIS: 78 year old female Stage I invasive lobular carcinoma s/p central lumpectomy on 09/02/11  PRIOR THERAPY: 1. Status post lumpectomy on 09/02/2011 with the final pathology revealing an invasive grade 1 lobular carcinoma measuring 0.9 cm with lobular carcinoma in situ noted. Medial and superior margins were focally positive. Patient subsequently underwent additional excision of the superior and deep margins that showed benign breast parenchyma with fibrocystic changes no atypia hyperplasia or malignancy. One sentinel node was negative for metastatic disease.  2. Patient will not receive radiation   CURRENT THERAPY:To begin tamoxifen 20 mg daily  INTERVAL HISTORY: [name] 78 y.o. female returns for Follow up visit . Overall she is doing well. After an extensive discussion with Dr. Roselind Messier patient has decided against radiation. She wouldlike to start her adjuvant hormonal agent. She and I had discussed the different agents and due to her arthritis it was felt that tamoxifen may be a better choice. She is without any complaints. Remainder of the 10 point review of systems is negative.  MEDICAL HISTORY: Past Medical History  Diagnosis Date  . Thyroid disease     hypothyroidism  . Hypercholesteremia   . Hypertension   . Hypothyroidism   . Heart palpitations   . Diabetes mellitus     type I- wears insulin pump  . Breast cancer     ALLERGIES:  is allergic to augmentin; hydrocodone; and latex.  MEDICATIONS:  Current Outpatient Prescriptions  Medication Sig Dispense Refill  . ALPRAZolam (XANAX) 0.5 MG tablet Take 0.25 mg by mouth as needed. Anxiety       . aspirin 81 MG tablet Take 81 mg by mouth daily.      . Calcium Citrate-Vitamin D (CITRACAL + D PO) Take 1,000 mg by mouth daily.      . cephALEXin (KEFLEX) 250 MG  capsule       . chlorpheniramine-HYDROcodone (TUSSIONEX) 10-8 MG/5ML LQCR       . cholecalciferol (VITAMIN D) 1000 UNITS tablet Take 1,000 Units by mouth daily.      [name] esomeprazole (NEXIUM) 40 MG capsule Take 1 capsule (40 mg total) by mouth daily before breakfast.  30 capsule  6  . fish oil-omega-3 fatty acids 1000 MG capsule Take 2 g by mouth daily.      . hydrochlorothiazide (MICROZIDE) 12.5 MG capsule Take 12.5 mg by mouth daily before breakfast.       . insulin glargine (LANTUS SOLOSTAR) 100 UNIT/ML injection Inject 14 Units into the skin at bedtime as needed. Back up as if pump broke.      . Insulin Glulisine (APIDRA SOLOSTAR IJ) Inject 1 each as directed. Use for back up when pump is down      . Insulin Human (INSULIN PUMP) 100 unit/ml SOLN Inject 0.425-0.725 each into the skin every hour. Apidra (insulin glulisine)12 am-2 am 0.600unit; 3 am-12 pm  0.725 units; 12 pm- 7 pm 0.425 units; 7pm - 12 am 0.450 units. Total basel rate is for the day is 13.95 units      . Lancets (ONETOUCH ULTRASOFT) lancets Daily.      . Multiple Vitamins-Minerals (MULTI-BETIC DIABETES PO) Take by mouth.      . ONE TOUCH ULTRA TEST test strip Daily.      . Risedronate Sodium (ATELVIA) 35 MG TBEC Take by mouth daily. Patient taking sample only.  No prescription issued as of 12/28/11. Doctor prescribed to see if this will work for patient first.      . simvastatin (ZOCOR) 20 MG tablet Take 20 mg by mouth at bedtime.       . tamoxifen (NOLVADEX) 20 MG tablet Daily.      [name] DISCONTD: levothyroxine (SYNTHROID, LEVOTHROID) 75 MCG tablet Take 75 mcg by mouth daily before breakfast.         SURGICAL HISTORY:  Past Surgical History  Procedure Date  . Bladder repair 5 yrs ago  . Tonsillectomy  age 60  . Appendectomy age 80  . Abdominal hysterectomy yrs ago  . Breast surgery  yrs ago    br bx  . Breast surgery Sep 02 2011    left central lumpectomy and snbx, re-excision lumpectomy    REVIEW OF SYSTEMS:  Pertinent  items are noted in HPI.   PHYSICAL EXAMINATION: General appearance: alert, cooperative and appears stated age Neck: no adenopathy, no carotid bruit, no JVD, supple, symmetrical, trachea midline and thyroid not enlarged, symmetric, no tenderness/mass/nodules Resp: clear to auscultation bilaterally and normal percussion bilaterally Back: symmetric, no curvature. ROM normal. No CVA tenderness. Cardio: regular rate and rhythm, S1, S2 normal, no murmur, click, rub or gallop GI: soft, non-tender; bowel sounds normal; no masses,  no organomegaly Extremities: extremities normal, atraumatic, no cyanosis or edema Neurologic: Alert and oriented X 3, normal strength and tone. Normal symmetric reflexes. Normal coordination and gait Breast Exam: left breast with central lumpectomy healing well, no other masses ECOG PERFORMANCE STATUS: 0 - Asymptomatic  Blood pressure 160/73, pulse 71, temperature 98.8 F (37.1 C), temperature source Oral, height 5\' 7"  (1.702 m), weight 146 lb 14.4 oz (66.633 kg).  LABORATORY DATA: Lab Results  Component Value Date   WBC 6.6 12/31/2011   HGB 11.3* 12/31/2011   HCT 32.7* 12/31/2011   MCV 81.5 12/31/2011   PLT 198 12/31/2011      Chemistry      Component Value Date/Time   NA 133* 10/30/2011 1332   K 4.0 10/30/2011 1332   CL 98 10/30/2011 1332   CO2 26 10/30/2011 1332   BUN 16 10/30/2011 1332   CREATININE 0.73 10/30/2011 1332      Component Value Date/Time   CALCIUM 9.9 10/30/2011 1332   ALKPHOS 94 10/30/2011 1332   AST 30 10/30/2011 1332   ALT 16 10/30/2011 1332   BILITOT 0.5 10/30/2011 1332     REASON: [id number]: Following a conversation with Dr. Dwain Sarna at the Breast Conference on 09/16/11, specimen # 2 actually represents additional superior and deep margins. Part 2 specimen site changed from Breast, excision, additional anterior and deep to Breast, excision, additional superior and deep margins. 09/16/11 09:41:52 AM (gt) FINAL DIAGNOSIS Diagnosis 1.  Breast, lumpectomy, Left - INVASIVE GRADE I LOBULAR CARCINOMA, SPANNING 0.9 CM. - LOBULAR CARCINOMA IN SITU PRESENT. - LYMPH/VASCULAR INVASION NOT IDENTIFIED. - MEDIAL AND SUPERIOR MARGINS ARE FOCALLY POSITIVE. - SEE ONCOLOGY TEMPLATE AND COMMENT. 2. Breast, excision, Additional superior and deep margins - BENIGN BREAST PARENCHYMA SHOWING FIBROCYSTIC CHANGES. - NO ATYPIA, HYPERPLASIA OR MALIGNANCY IDENTIFIED. 3. Lymph node, sentinel, biopsy, Left axilla - ONE BENIGN LYMPH NODE WITH NO TUMOR SEEN (0/1). - SEE COMMENT. Microscopic Comment 1. BREAST, INVASIVE TUMOR, WITH LYMPH NODE SAMPLING Specimen, including laterality: Left breast lumpectomy with additional anterior and deep margin, resection and sentinel lymph node biopsy. Procedure: Left breast lumpectomy with additional anterior and deep margin, resection and sentinel lymph node biopsy. Grade:  I Tubule formation: 3. Nuclear pleomorphism: 1. Mitotic:1. Tumor size (glass slide measurement): 0.9 cm. Margins: Invasive, distance to closest margin: Medial and superior margin is focally positive. Lymphovascular invasion: Not identified. 1 of 3 Amended copy Corrected FINAL for Ihnen, Raeleen C ([id number]) Microscopic Comment(continued) Ductal carcinoma in situ: No. Lobular neoplasia: Yes, lobular carcinoma in situ present. Tumor focality: Unifocal. Treatment effect: Not applicable. Extent of tumor: Tumor involves breast parenchyma and deep dermis. Lymph nodes: # examined: .1 Lymph nodes with metastasis: 0. Breast prognostic profile: Performed on [id number] Estrogen receptor: 97%, positive. Progesterone receptor: 35%, positive. Her 2 neu: 1.03, not amplified. Ki-67: 9%, low. Non-neoplastic breast: A small amount of usual ductal hyperplasia is present with calcifications. Benign stromal and vascular calcifications are also present. TNM: pT1b, pN0, MX Comments: A cytokeratin AE1/AE3 stain is performed on two blocks (1B and  1G) to determine the extent of the invasive lobular carcinoma. The stains confirm that the medial and superior margins are focally positive. Dr. Colonel Bald has seen this case in consultation with agreement of the positive margin status. A Her-2/neu by CISH will be repeated on the current tumor. RAH:gt, 09/07/11) 3. Immunohistochemical staining is performed on the sentinel lymph node for cytokeratin AE1/AE3. The stain is negative confirming that there is no metastatic carcinoma. (RH:gt, 09/07/11) Pecola Leisure MD   ASSESSMENT: 78 year old female with  #1 stage IA invasive lobular carcinoma of the left breast status post central lumpectomy the tumor was ER positive measuring 0.9 cm. PR +35% ER +97% HER-2/neu negative proliferation marker 9% and low.  #2 patient is now on tamoxifen 20 mg daily  # 3 patient was started on a bisphospponate therapy but she is having significant complaints with myalgias and arthralgias   PLAN: Continue to tamoxifen 20 mg on a daily basis. Contrast using an alternative to give to the current oral bisphosphonate that she is on. The possibility of using prolia or reclast was discussed with the patient. She will discuss this with Dr. Nicholas Lose. We certainly could get the pearly here at the cancer Center as an infusion every 6 months. Discuss this with Dr. Nicholas Lose and let me know what the decision is. In the meantime I have set her up to see me in 6 months.  All questions were answered. The patient knows to call the clinic with any problems, questions or concerns. We can certainly see the patient much sooner if necessary.  I spent 25 minutes counseling the patient face to face. The total time spent in the appointment was 30 minutes.    Drue Second, MD Medical/Oncology [facility] [phone] (beeper) [phone] (Office)  12/31/2011, 2:08 PM

## 2012-01-01 LAB — COMPREHENSIVE METABOLIC PANEL
ALT: 54 U/L — ABNORMAL HIGH (ref 0–35)
AST: 61 U/L — ABNORMAL HIGH (ref 0–37)
Albumin: 4.2 g/dL (ref 3.5–5.2)
BUN: 18 mg/dL (ref 6–23)
CO2: 28 mEq/L (ref 19–32)
Calcium: 9.4 mg/dL (ref 8.4–10.5)
Chloride: 96 mEq/L (ref 96–112)
Creatinine, Ser: 0.77 mg/dL (ref 0.50–1.10)
Potassium: 4 mEq/L (ref 3.5–5.3)

## 2012-01-01 LAB — VITAMIN D 25 HYDROXY (VIT D DEFICIENCY, FRACTURES): Vit D, 25-Hydroxy: 44 ng/mL (ref 30–89)

## 2012-01-07 ENCOUNTER — Telehealth: Payer: Self-pay | Admitting: *Deleted

## 2012-01-07 NOTE — Telephone Encounter (Signed)
Pt called states " I've been having this bone pain, can't hardly walk, legs are tired and weak. I stopped the bone pill Dr. Nicholas Lose had me on. [place] Spaniel it may be causing the bone pain. I saw Dr. Welton Flakes on 05/23 and it got even worse after I started Tamoxifen. I couldn't hardly walk, so I stopped it on Tuesday. My legs are a little bit better but now I have cramps in my legs that go up all the way to my groin. I'm afraid if I'm not taking that pill the cancer will come back." Discussed with pt I will review with Harriett Sine, NP as MD out of office. Pt gave call back # [phone] F/U-MD 06/17/12

## 2012-01-07 NOTE — Telephone Encounter (Signed)
Call and tell her to stay off the Tamoxifen and come in to see Dr. Welton Flakes next available appt, within 3 weeks. She will switch her to different medicine.

## 2012-01-08 ENCOUNTER — Telehealth: Payer: Self-pay | Admitting: Oncology

## 2012-01-08 NOTE — Telephone Encounter (Signed)
Called notified pt to stop tamoxifen. Pt will need to f/u with MD for evalulation in next 3 weeks.  Informed pt to expect a a call from scheduling regarding a f/u appt. Pt verbalized understanding. Onc Tx sent to scheduling.

## 2012-01-08 NOTE — Telephone Encounter (Signed)
lmonvm adviisng the pt of her June appts with dr Welton Flakes

## 2012-01-11 NOTE — Progress Notes (Signed)
Encounter addended by: Amanda Pea, RN on: 01/11/2012  5:30 PM<BR>     Documentation filed: Charges VN

## 2012-02-01 ENCOUNTER — Encounter: Payer: Self-pay | Admitting: Oncology

## 2012-02-01 ENCOUNTER — Other Ambulatory Visit: Payer: Medicare Other | Admitting: Lab

## 2012-02-01 ENCOUNTER — Telehealth: Payer: Self-pay | Admitting: Oncology

## 2012-02-01 ENCOUNTER — Ambulatory Visit (HOSPITAL_BASED_OUTPATIENT_CLINIC_OR_DEPARTMENT_OTHER): Payer: Medicare Other | Admitting: Oncology

## 2012-02-01 ENCOUNTER — Other Ambulatory Visit: Payer: Self-pay | Admitting: Oncology

## 2012-02-01 ENCOUNTER — Ambulatory Visit (HOSPITAL_COMMUNITY)
Admission: RE | Admit: 2012-02-01 | Discharge: 2012-02-01 | Disposition: A | Discharge status: Home / Self Care | Payer: Medicare Other | Source: Ambulatory Visit | Attending: Oncology | Admitting: Oncology

## 2012-02-01 VITALS — BP 157/72 | HR 92 | Temp 98.5°F | Ht 67.0 in | Wt 145.2 lb

## 2012-02-01 DIAGNOSIS — J40 Bronchitis, not specified as acute or chronic: Secondary | ICD-10-CM

## 2012-02-01 DIAGNOSIS — C50419 Malignant neoplasm of upper-outer quadrant of unspecified female breast: Secondary | ICD-10-CM | POA: Insufficient documentation

## 2012-02-01 DIAGNOSIS — C801 Malignant (primary) neoplasm, unspecified: Secondary | ICD-10-CM

## 2012-02-01 DIAGNOSIS — Z170 Estrogen receptor positive status [ER+]: Secondary | ICD-10-CM

## 2012-02-01 MED ORDER — AZITHROMYCIN 250 MG PO TABS
ORAL_TABLET | ORAL | Status: AC
Start: 2012-02-01 — End: 2012-02-06

## 2012-02-01 NOTE — Patient Instructions (Addendum)
1. Stop Tamoxifen.  2. I will refer you back to Dr. Roselind Messier for consideration of RT  3. Chest X-ray today  4. I will see you back in January 2014

## 2012-02-01 NOTE — Telephone Encounter (Signed)
gve the pt her jan 2014 appt calendar along with the cxr referral for tday at [facility]

## 2012-02-01 NOTE — Progress Notes (Signed)
OFFICE PROGRESS NOTE  CC Dr. Beather Arbour Dr. Antony Blackbird Dr. Alvie Heidelberg, MD [facility] [place] [address]  DIAGNOSIS: 78 year old female Stage I invasive lobular carcinoma s/p central lumpectomy on 09/02/11  PRIOR THERAPY: 1. Status post lumpectomy on 09/02/2011 with the final pathology revealing an invasive grade 1 lobular carcinoma measuring 0.9 cm with lobular carcinoma in situ noted. Medial and superior margins were focally positive. Patient subsequently underwent additional excision of the superior and deep margins that showed benign breast parenchyma with fibrocystic changes no atypia hyperplasia or malignancy. One sentinel node was negative for metastatic disease.  2. Patient Was started on tamoxifen but she has not been able to tolerate it and [place] it was discontinued on 01/07/2012.  CURRENT THERAPY: Observation and patient will be referred back to radiation oncology  INTERVAL HISTORY: [name] 78 y.o. female returns for Follow up visit .Today clinically she is doing well but she does have a cough and thinks that she has bronchitis and she is quite upset over this. She also continues to complain of some aches and pains. However since stopping the tamoxifen her discomfort has significantly improved from myalgias and arthralgias which and weakness in that she had been experiencing. She discontinued the tamoxifen on May 30. Today she has some tenderness in both breasts. She does not have any nausea or vomiting she does have a cough. She has not had any fevers or chills or night sweats. Remainder of the 10 point review of systems is negative. MEDICAL HISTORY: Past Medical History  Diagnosis Date  . Thyroid disease     hypothyroidism  . Hypercholesteremia   . Hypertension   . Hypothyroidism   . Heart palpitations   . Diabetes mellitus     type I- wears insulin pump  . Breast cancer     ALLERGIES:  is allergic to augmentin; keflex;  hydrocodone; and latex.  MEDICATIONS:  Current Outpatient Prescriptions  Medication Sig Dispense Refill  . ALPRAZolam (XANAX) 0.5 MG tablet Take 0.25 mg by mouth as needed. Anxiety       . aspirin 81 MG tablet Take 81 mg by mouth daily.      . Calcium Citrate-Vitamin D (CITRACAL + D PO) Take 1,000 mg by mouth daily.      . chlorpheniramine-HYDROcodone (TUSSIONEX) 10-8 MG/5ML LQCR       . cholecalciferol (VITAMIN D) 1000 UNITS tablet Take 1,000 Units by mouth daily.      . fish oil-omega-3 fatty acids 1000 MG capsule Take 2 g by mouth daily.      . hydrochlorothiazide (MICROZIDE) 12.5 MG capsule Take 12.5 mg by mouth daily before breakfast.       . Insulin Glulisine (APIDRA SOLOSTAR IJ) Inject 1 each as directed. Use for back up when pump is down      . Insulin Human (INSULIN PUMP) 100 unit/ml SOLN Inject 0.425-0.725 each into the skin every hour. Apidra (insulin glulisine)12 am-2 am 0.600unit; 3 am-12 pm  0.725 units; 12 pm- 7 pm 0.425 units; 7pm - 12 am 0.450 units. Total basel rate is for the day is 13.95 units      . Lancets (ONETOUCH ULTRASOFT) lancets Daily.      . Multiple Vitamins-Minerals (MULTI-BETIC DIABETES PO) Take by mouth.      . ONE TOUCH ULTRA TEST test strip Daily.      . Risedronate Sodium (ATELVIA) 35 MG TBEC Take by mouth daily. Patient taking sample only. No prescription  issued as of 12/28/11. Doctor prescribed to see if this will work for patient first.      . simvastatin (ZOCOR) 20 MG tablet Take 20 mg by mouth at bedtime.       [name] esomeprazole (NEXIUM) 40 MG capsule Take 1 capsule (40 mg total) by mouth daily before breakfast.  30 capsule  6  . insulin glargine (LANTUS SOLOSTAR) 100 UNIT/ML injection Inject 14 Units into the skin at bedtime as needed. Back up as if pump broke.      . tamoxifen (NOLVADEX) 20 MG tablet Daily.      [name] DISCONTD: levothyroxine (SYNTHROID, LEVOTHROID) 75 MCG tablet Take 75 mcg by mouth daily before breakfast.         SURGICAL HISTORY:  Past  Surgical History  Procedure Date  . Bladder repair 5 yrs ago  . Tonsillectomy  age 32  . Appendectomy age 32  . Abdominal hysterectomy yrs ago  . Breast surgery  yrs ago    br bx  . Breast surgery Sep 02 2011    left central lumpectomy and snbx, re-excision lumpectomy    REVIEW OF SYSTEMS:  Pertinent items are noted in HPI.   PHYSICAL EXAMINATION: General appearance: alert, cooperative and appears stated age Neck: no adenopathy, no carotid bruit, no JVD, supple, symmetrical, trachea midline and thyroid not enlarged, symmetric, no tenderness/mass/nodules Resp: clear to auscultation bilaterally and normal percussion bilaterally Back: symmetric, no curvature. ROM normal. No CVA tenderness. Cardio: regular rate and rhythm, S1, S2 normal, no murmur, click, rub or gallop GI: soft, non-tender; bowel sounds normal; no masses,  no organomegaly Extremities: extremities normal, atraumatic, no cyanosis or edema Neurologic: Alert and oriented X 3, normal strength and tone. Normal symmetric reflexes. Normal coordination and gait Breast Exam: left breast with central lumpectomy healing well, no other masses ECOG PERFORMANCE STATUS: 0 - Asymptomatic  Blood pressure 157/72, pulse 92, temperature 98.5 F (36.9 C), temperature source Oral, height 5\' 7"  (1.702 m), weight 145 lb 3.2 oz (65.862 kg).  LABORATORY DATA: Lab Results  Component Value Date   WBC 6.6 12/31/2011   HGB 11.3* 12/31/2011   HCT 32.7* 12/31/2011   MCV 81.5 12/31/2011   PLT 198 12/31/2011      Chemistry      Component Value Date/Time   NA 131* 12/31/2011 1259   K 4.0 12/31/2011 1259   CL 96 12/31/2011 1259   CO2 28 12/31/2011 1259   BUN 18 12/31/2011 1259   CREATININE 0.77 12/31/2011 1259      Component Value Date/Time   CALCIUM 9.4 12/31/2011 1259   ALKPHOS 62 12/31/2011 1259   AST 61* 12/31/2011 1259   ALT 54* 12/31/2011 1259   BILITOT 0.5 12/31/2011 1259     REASON: [id number]: Following a conversation with Dr.  Dwain Sarna at the Breast Conference on 09/16/11, specimen # 2 actually represents additional superior and deep margins. Part 2 specimen site changed from Breast, excision, additional anterior and deep to Breast, excision, additional superior and deep margins. 09/16/11 09:41:52 AM (gt) FINAL DIAGNOSIS Diagnosis 1. Breast, lumpectomy, Left - INVASIVE GRADE I LOBULAR CARCINOMA, SPANNING 0.9 CM. - LOBULAR CARCINOMA IN SITU PRESENT. - LYMPH/VASCULAR INVASION NOT IDENTIFIED. - MEDIAL AND SUPERIOR MARGINS ARE FOCALLY POSITIVE. - SEE ONCOLOGY TEMPLATE AND COMMENT. 2. Breast, excision, Additional superior and deep margins - BENIGN BREAST PARENCHYMA SHOWING FIBROCYSTIC CHANGES. - NO ATYPIA, HYPERPLASIA OR MALIGNANCY IDENTIFIED. 3. Lymph node, sentinel, biopsy, Left axilla - ONE BENIGN LYMPH NODE WITH NO TUMOR  SEEN (0/1). - SEE COMMENT. Microscopic Comment 1. BREAST, INVASIVE TUMOR, WITH LYMPH NODE SAMPLING Specimen, including laterality: Left breast lumpectomy with additional anterior and deep margin, resection and sentinel lymph node biopsy. Procedure: Left breast lumpectomy with additional anterior and deep margin, resection and sentinel lymph node biopsy. Grade: I Tubule formation: 3. Nuclear pleomorphism: 1. Mitotic:1. Tumor size (glass slide measurement): 0.9 cm. Margins: Invasive, distance to closest margin: Medial and superior margin is focally positive. Lymphovascular invasion: Not identified. 1 of 3 Amended copy Corrected FINAL for Fawcett, Lochlyn C ([id number]) Microscopic Comment(continued) Ductal carcinoma in situ: No. Lobular neoplasia: Yes, lobular carcinoma in situ present. Tumor focality: Unifocal. Treatment effect: Not applicable. Extent of tumor: Tumor involves breast parenchyma and deep dermis. Lymph nodes: # examined: .1 Lymph nodes with metastasis: 0. Breast prognostic profile: Performed on [id number] Estrogen receptor: 97%, positive. Progesterone receptor:  35%, positive. Her 2 neu: 1.03, not amplified. Ki-67: 9%, low. Non-neoplastic breast: A small amount of usual ductal hyperplasia is present with calcifications. Benign stromal and vascular calcifications are also present. TNM: pT1b, pN0, MX Comments: A cytokeratin AE1/AE3 stain is performed on two blocks (1B and 1G) to determine the extent of the invasive lobular carcinoma. The stains confirm that the medial and superior margins are focally positive. Dr. Colonel Bald has seen this case in consultation with agreement of the positive margin status. A Her-2/neu by CISH will be repeated on the current tumor. RAH:gt, 09/07/11) 3. Immunohistochemical staining is performed on the sentinel lymph node for cytokeratin AE1/AE3. The stain is negative confirming that there is no metastatic carcinoma. (RH:gt, 09/07/11) Pecola Leisure MD   ASSESSMENT: 78 year old female with  #1 stage IA invasive lobular carcinoma of the left breast status post central lumpectomy the tumor was ER positive measuring 0.9 cm. PR +35% ER +97% HER-2/neu negative proliferation marker 9% and low.  #2 patient Had been on tamoxifen however on 01/07/2012 she discontinued it due to severe myalgias and arthralgias.  # 3 patient was started on a bisphospponate therapy but she is having significant complaints with myalgias and arthralgias  #4 cough unclear etiology possibly bronchitis.  PLAN:. #1 we will have the patient discontinued tamoxifen completely.  #2 she will be referred to radiation oncology for consideration of radiation therapy at this point. I do not think that she will be able to tolerate any of the aromatase inhibitors do to her myalgias and arthralgias she has been experiencing even with tamoxifen.  #3 I will give her azithromycin Z-Pak for possible bronchitis and we will obtain a chest x-ray.  #4 patient will return in 6 months time for followup   All questions were answered. The patient knows to call the clinic with any  problems, questions or concerns. We can certainly see the patient much sooner if necessary.  I spent 25 minutes counseling the patient face to face. The total time spent in the appointment was 30 minutes.    Drue Second, MD Medical/Oncology [facility] [phone] (beeper) [phone] (Office)  02/01/2012, 11:45 AM

## 2012-02-02 ENCOUNTER — Telehealth: Payer: Self-pay | Admitting: *Deleted

## 2012-02-02 NOTE — Telephone Encounter (Signed)
Per MD, notified pt Chest Xray normal. Pt verbalized understanding

## 2012-02-02 NOTE — Telephone Encounter (Signed)
Message copied by Cooper Render on Tue Feb 02, 2012  3:28 PM ------      Message from: [name]      Created: Mon Feb 01, 2012 10:47 PM       Call patient: chest x ray normal

## 2012-02-08 ENCOUNTER — Ambulatory Visit: Payer: Medicare Other

## 2012-02-08 ENCOUNTER — Ambulatory Visit: Admission: RE | Admit: 2012-02-08 | Payer: Medicare Other | Source: Ambulatory Visit | Admitting: Radiation Oncology

## 2012-02-09 ENCOUNTER — Other Ambulatory Visit (INDEPENDENT_AMBULATORY_CARE_PROVIDER_SITE_OTHER): Payer: Medicare Other

## 2012-02-09 ENCOUNTER — Encounter: Payer: Self-pay | Admitting: Internal Medicine

## 2012-02-09 ENCOUNTER — Ambulatory Visit (INDEPENDENT_AMBULATORY_CARE_PROVIDER_SITE_OTHER): Payer: Medicare Other | Admitting: Internal Medicine

## 2012-02-09 VITALS — BP 130/62 | HR 83 | Temp 98.8°F | Ht 67.0 in | Wt 143.8 lb

## 2012-02-09 DIAGNOSIS — R109 Unspecified abdominal pain: Secondary | ICD-10-CM

## 2012-02-09 DIAGNOSIS — E041 Nontoxic single thyroid nodule: Secondary | ICD-10-CM

## 2012-02-09 DIAGNOSIS — I10 Essential (primary) hypertension: Secondary | ICD-10-CM | POA: Insufficient documentation

## 2012-02-09 DIAGNOSIS — C50419 Malignant neoplasm of upper-outer quadrant of unspecified female breast: Secondary | ICD-10-CM

## 2012-02-09 DIAGNOSIS — R945 Abnormal results of liver function studies: Secondary | ICD-10-CM

## 2012-02-09 DIAGNOSIS — E7800 Pure hypercholesterolemia, unspecified: Secondary | ICD-10-CM | POA: Insufficient documentation

## 2012-02-09 DIAGNOSIS — R7989 Other specified abnormal findings of blood chemistry: Secondary | ICD-10-CM

## 2012-02-09 DIAGNOSIS — E109 Type 1 diabetes mellitus without complications: Secondary | ICD-10-CM

## 2012-02-09 DIAGNOSIS — E039 Hypothyroidism, unspecified: Secondary | ICD-10-CM

## 2012-02-09 DIAGNOSIS — M1990 Unspecified osteoarthritis, unspecified site: Secondary | ICD-10-CM | POA: Insufficient documentation

## 2012-02-09 DIAGNOSIS — Z79899 Other long term (current) drug therapy: Secondary | ICD-10-CM

## 2012-02-09 DIAGNOSIS — M810 Age-related osteoporosis without current pathological fracture: Secondary | ICD-10-CM | POA: Insufficient documentation

## 2012-02-09 LAB — BASIC METABOLIC PANEL
BUN: 16 mg/dL (ref 6–23)
CO2: 26 mEq/L (ref 19–32)
Chloride: 95 mEq/L — ABNORMAL LOW (ref 96–112)
GFR: 91.43 mL/min (ref >60.00)
Glucose, Bld: 174 mg/dL — ABNORMAL HIGH (ref 70–99)
Potassium: 3.8 mEq/L (ref 3.5–5.1)
Sodium: 129 mEq/L — ABNORMAL LOW (ref 135–145)

## 2012-02-09 LAB — HEPATIC FUNCTION PANEL
ALT: 58 U/L — ABNORMAL HIGH (ref 0–35)
AST: 61 U/L — ABNORMAL HIGH (ref 0–37)
Albumin: 3.7 g/dL (ref 3.5–5.2)
Alkaline Phosphatase: 55 U/L (ref 39–117)
Total Bilirubin: 0.4 mg/dL (ref 0.3–1.2)

## 2012-02-09 MED ORDER — INSULIN GLULISINE 100 UNIT/ML IJ SOLN: INTRAMUSCULAR | Status: DC

## 2012-02-09 NOTE — Assessment & Plan Note (Signed)
On medication replacment for years, varying doses reviewed Check TSH and adjust as needed Lab Results  Component Value Date   TSH 2.71 06/16/2011

## 2012-02-09 NOTE — Assessment & Plan Note (Signed)
Breast ca dx 08/2011 - s/p lumpectomy and margin resection Intol of Tamoxifen - stopped same 01/07/12 due to side effects  considering adjunctive XRT needs Continue working with med and rad onc as ongoing, reviewed today

## 2012-02-09 NOTE — Assessment & Plan Note (Signed)
On insulin pump since 2007 - requests local endo to help manage same (difficult drive to [facility] at her age) Reviewed settings, works with "pump nurse" q87mo for review Denies complications - sees optho annually On ASA, statin check a1c and refer to local endo Lab Results  Component Value Date   HGBA1C 7.0* 06/16/2011

## 2012-02-09 NOTE — Assessment & Plan Note (Signed)
[facility] ultrasound report 4/30/3 reviewed - copied into EMR Recheck q60mo and refer to endo as above

## 2012-02-09 NOTE — Progress Notes (Signed)
Subjective:    Patient ID: [name], female    DOB: 1929/03/18, 78 y.o.   MRN: [id number]  HPI New pt to me and our group, here to establis care  DM1 - on insulin pump since 2007 - has followed with endo at [facility] but would like to est locally - denies complications with neuropathy, infection ro optho - denies hypoglycemia  Beast ca - dx 08/2011 s/p resction - intol of tamoxifen, considering XRT adjunct care - working with med onc on same  Dyslipidemia -on simva since 2003 - the patient reports compliance with medication(s) as prescribed. Denies adverse side effects.  Hypothyroid - the patient reports compliance with medication(s) as prescribed. Denies adverse side effects.  Past Medical History  Diagnosis Date  . Hypothyroid   . Hypercholesteremia   . Hypertension   . Diabetes mellitus type 1     type I- wears insulin pump  . Breast cancer   . Cancer of upper-outer quadrant of female breast 08/21/2011    ER +  PR +  Her 2 -  Ki67 9%   0.9 cm invasive lobular  Carcinoma ,s/p central lumpectomy with sentinel node biopsy,  ER/PR positive s/p re-excion on 09/16/11 with final pathology showing atypical hyperplasia   . Osteoarthritis   . Cardiac arrhythmia due to congenital heart disease   . Thyroid nodule dx 07/2011    [place] q 23mo to follow calcified L thyroid nodule (12/08/11 [place])  . Osteoporosis, post-menopausal     DEXA 12/02/11: -3.5 (with lomax gyn), declines bisphos due to bone pain   Family History  Problem Relation Age of Onset  . Cancer Sister     unknown  . Hypertension Mother   . Stroke Mother   . Arthritis Father   . Heart disease Father   . Diabetes Son    History  Substance Use Topics  . Smoking status: Former Smoker -- 1.0 packs/day for 10 years    Types: Cigarettes    Quit date: 08/28/1979  . Smokeless tobacco: Never Used  . Alcohol Use: Yes     rare   Review of Systems Constitutional: Negative for fever or weight change.  Respiratory: Negative for cough and  shortness of breath.   Cardiovascular: Negative for chest pain or palpitations.  Gastrointestinal: Negative for abdominal pain, no bowel changes.  Musculoskeletal: Negative for gait problem or joint swelling.  Skin: Negative for rash.  Neurological: Negative for dizziness or headache.  No other specific complaints in a complete review of systems (except as listed in HPI above).     Objective:   Physical Exam BP 130/62  Pulse 83  Temp 98.8 F (37.1 C) (Oral)  Ht 5\' 7"  (1.702 m)  Wt 143 lb 12.8 oz (65.227 kg)  BMI 22.52 kg/m2  SpO2 97% Wt Readings from Last 3 Encounters:  02/09/12 143 lb 12.8 oz (65.227 kg)  02/01/12 145 lb 3.2 oz (65.862 kg)  12/31/11 146 lb 14.4 oz (66.633 kg)   Constitutional: She appears well-developed and well-nourished. No distress.  HENT: Head: Normocephalic and atraumatic. Ears: B TMs ok, no erythema or effusion; Nose: Nose normal. Mouth/Throat: Oropharynx is clear and moist. No oropharyngeal exudate.  Eyes: Conjunctivae and EOM are normal. Pupils are equal, round, and reactive to light. No scleral icterus.  Neck: Normal range of motion. Neck supple. No JVD present. No thyromegaly present.  Cardiovascular: Normal rate, regular rhythm and normal heart sounds.  No murmur heard. No BLE edema. Pulmonary/Chest: Effort normal and  breath sounds normal. No respiratory distress. She has no wheezes.  Abdominal: Soft. Bowel sounds are normal. She exhibits no distension. There is no tenderness. no masses Musculoskeletal: Normal range of motion, no joint effusions. No gross deformities Neurological: She is alert and oriented to person, place, and time. No cranial nerve deficit. Coordination normal.  Skin: Skin is warm and dry. No rash noted. No erythema.  Psychiatric: She has a normal mood and affect. Her behavior is normal. Judgment and thought content normal.   Lab Results  Component Value Date   WBC 6.6 12/31/2011   HGB 11.3* 12/31/2011   HCT 32.7* 12/31/2011   PLT  198 12/31/2011   GLUCOSE 152* 12/31/2011   CHOL 179 06/16/2011   TRIG 54 06/16/2011   HDL 64 06/16/2011   LDLCALC 104 06/16/2011   ALT 54* 12/31/2011   AST 61* 12/31/2011   NA 131* 12/31/2011   K 4.0 12/31/2011   CL 96 12/31/2011   CREATININE 0.77 12/31/2011   BUN 18 12/31/2011   CO2 28 12/31/2011   TSH 2.71 06/16/2011   HGBA1C 7.0* 06/16/2011        Assessment & Plan:  See problem list. Medications and labs reviewed today.  Time spent with pt today 45 minutes, greater than 50% time spent counseling patient on diabetes, breast cancer and medication review. Also review of prior records

## 2012-02-09 NOTE — Assessment & Plan Note (Signed)
BP Readings from Last 3 Encounters:  02/09/12 130/62  02/01/12 157/72  12/31/11 160/73   The current medical regimen is effective;  continue present plan and medications.

## 2012-02-09 NOTE — Patient Instructions (Signed)
It was good to see you today. We have reviewed your prior records including labs and tests today Test(s) ordered today. Your results will be called to you after review (48-72hours after test completion). If any changes need to be made, you will be notified at that time. Medications reviewed, no changes at this time. Refill on medication(s) as discussed today. we'll make referral to local endocrinologist . Our office will contact you regarding appointment(s) once made. Please schedule followup in 3 months, call sooner if problems. Will schedule another thyroid ultrasound end of October/early November

## 2012-02-10 DIAGNOSIS — R945 Abnormal results of liver function studies: Secondary | ICD-10-CM | POA: Insufficient documentation

## 2012-02-10 NOTE — Assessment & Plan Note (Signed)
New lab change 12/2011 -  No new eds, prev normal LFTs 10/2011 and before On same simva dose for years Check Abd [place]

## 2012-02-10 NOTE — Addendum Note (Signed)
Addended by: Rene Paci A on: 02/10/2012 01:10 PM   Modules accepted: Orders

## 2012-02-16 ENCOUNTER — Ambulatory Visit
Admission: RE | Admit: 2012-02-16 | Discharge: 2012-02-16 | Disposition: A | Discharge status: Home / Self Care | Payer: Medicare Other | Source: Ambulatory Visit | Attending: Internal Medicine | Admitting: Internal Medicine

## 2012-02-16 DIAGNOSIS — C50419 Malignant neoplasm of upper-outer quadrant of unspecified female breast: Secondary | ICD-10-CM

## 2012-02-16 DIAGNOSIS — R945 Abnormal results of liver function studies: Secondary | ICD-10-CM

## 2012-02-16 DIAGNOSIS — R109 Unspecified abdominal pain: Secondary | ICD-10-CM

## 2012-02-17 ENCOUNTER — Ambulatory Visit
Admission: RE | Admit: 2012-02-17 | Discharge: 2012-02-17 | Disposition: A | Discharge status: Home / Self Care | Payer: Medicare Other | Source: Ambulatory Visit | Attending: Radiation Oncology | Admitting: Radiation Oncology

## 2012-02-17 ENCOUNTER — Encounter: Payer: Self-pay | Admitting: Radiation Oncology

## 2012-02-17 VITALS — BP 164/72 | HR 67 | Temp 98.4°F | Resp 20 | Wt 144.5 lb

## 2012-02-17 DIAGNOSIS — C50419 Malignant neoplasm of upper-outer quadrant of unspecified female breast: Secondary | ICD-10-CM

## 2012-02-17 DIAGNOSIS — C50919 Malignant neoplasm of unspecified site of unspecified female breast: Secondary | ICD-10-CM | POA: Insufficient documentation

## 2012-02-17 HISTORY — DX: Spondylosis, unspecified: M47.9

## 2012-02-17 NOTE — Progress Notes (Signed)
Patient here as a followup new consult Breast Cancer, off Tamoxifen  Bone pain, liver functions elevated,, U/S abdomen done yesterday and results in chart,   Married, 3 children,3 stepchildren Gave patient coke and graham crackers, per her stating "my blood sugar is going down, I see black spots ,I can feel it" she checked her blood sugar finger stick =64 occasinal sharp pains still left breast  Allergies:Augmentin,  meds updated

## 2012-02-17 NOTE — Progress Notes (Signed)
Please see the Nurse Progress Note in the MD Initial Consult Encounter for this patient. 

## 2012-02-17 NOTE — Progress Notes (Signed)
Radiation Oncology         [phone] ________________________________  Name: [name] MRN: [id number]  Date: 02/17/2012  DOB: 1928-12-01  Reevaluation note   CC: Rene Paci, MD  Victorino December, MD  Diagnosis:   Invasive lobular carcinoma of the left breast, stage  pT1B, p N0    Narrative:  The patient returns today for further evaluation. Patient was initially seen in the multidisciplinary breast clinic and then at a later date. She was seen on February 20 and she elected to proceed with adjuvant hormonal therapy rather than radiation therapy as part of her management. She has been on tamoxifen. Unfortunately she is developed significant arthralgias and myalgias and subsequently taken off tamoxifen. The patient is doing better concerning this issue since being off tamoxifen. Since the patient is off adjuvant hormonal therapy she is now seen in radiation oncology for consideration for radiation as part of her overall management.                         ALLERGIES:  is allergic to augmentin; keflex; hydrocodone; and latex.  Meds: Current Outpatient Prescriptions  Medication Sig Dispense Refill  . ALPRAZolam (XANAX) 0.5 MG tablet Take 0.25 mg by mouth as needed. Anxiety       . aspirin 81 MG tablet Take 81 mg by mouth daily.      . cholecalciferol (VITAMIN D) 1000 UNITS tablet Take 1,000 Units by mouth daily.      . fish oil-omega-3 fatty acids 1000 MG capsule Take 2 g by mouth daily.      . insulin glulisine (APIDRA) 100 UNIT/ML injection Dose is variable. Use as indicated in insulin pump. 4 vials per month  40 mL  1  . Insulin Human (INSULIN PUMP) 100 unit/ml SOLN Inject 0.425-0.725 each into the skin every hour. Apidra (insulin glulisine)12 am-2 am 0.600unit; 3 am-12 pm  0.725 units; 12 pm- 7 pm 0.425 units; 7pm - 12 am 0.450 units. Total basel rate is for the day is 13.95 units      . Lancets (ONETOUCH ULTRASOFT) lancets Daily.      [name] levothyroxine (SYNTHROID,  LEVOTHROID) 75 MCG tablet Take 75 mcg by mouth daily.      . Multiple Vitamins-Minerals (MULTI-BETIC DIABETES PO) Take by mouth.      . ONE TOUCH ULTRA TEST test strip Daily.      . simvastatin (ZOCOR) 20 MG tablet Take 20 mg by mouth at bedtime.       . Calcium Citrate-Vitamin D (CITRACAL + D PO) Take 1,000 mg by mouth daily.        Physical Findings: The patient is in no acute distress. Patient is alert and oriented.  weight is 144 lb 8 oz (65.545 kg). Her oral temperature is 98.4 F (36.9 C). Her blood pressure is 164/72 and her pulse is 67. Her respiration is 20. [name]  No palpable cervical or supraclavicular or axillary adenopathy.  The lungs are clear. The heart has a regular rhythm and rate. Examination of left breast reveals the nipple areolar complex area to be surgically absent. There is some induration along the lumpectomy scar but no dominant masses appreciated in the breast. The right breast is  free of mass or nipple discharge.  Lab Findings: Lab Results  Component Value Date   WBC 6.6 12/31/2011   HGB 11.3* 12/31/2011   HCT 32.7* 12/31/2011   MCV 81.5 12/31/2011   PLT 198  12/31/2011    @LASTCHEM @  Radiographic Findings: Dg Chest 2 View  02/01/2012  *RADIOLOGY REPORT*  Clinical Data: Cough and pleuritic chest pain  CHEST - 2 VIEW  Comparison: 09/09/2011  Findings: Heart size appears normal.  No pleural effusion or edema.  The lungs appear hyperinflated and there are coarsened interstitial markings compatible with COPD.  No airspace consolidation identified.  There is mild spondylosis within the thoracic spine.  IMPRESSION:  1.  No acute cardiopulmonary abnormalities  Original Report Authenticated By: Rosealee Albee, M.D.   US Abdomen Complete  02/16/2012  *RADIOLOGY REPORT*  Clinical Data:  Elevated liver function tests, diarrhea, constipation  COMPLETE ABDOMINAL ULTRASOUND  Comparison:  CT abdomen and pelvis of 02/23/2007  Findings:  Gallbladder:  The gallbladder is visualized and  no gallstones are noted.  There is no pain over the gallbladder with compression.  Common bile duct:  The common bile duct is normal measuring 3.4 mm in diameter.  Liver:  The liver has a normal echogenic pattern.  No focal abnormality is seen.  IVC:  The IVC is obscured by bowel gas.  Pancreas:  The pancreas is unremarkable.  Spleen:  The spleen is normal measuring 5.4 cm sagittally.  Right Kidney:  No hydronephrosis is seen.  The right kidney measures 10.2 cm sagittally.  Left Kidney:  No hydronephrosis is noted.  The left kidney measures 10.9 cm.  Abdominal aorta:  The abdominal aorta is normal in caliber.  IMPRESSION: Negative abdominal ultrasound.  No gallstones.  Original Report Authenticated By: Juline Patch, M.D.    Impression:  The patient would be a candidate for radiation therapy as part of her overall management  to reduce recurrence rate within the left breast. I discussed the overall treatment course side effects and potential toxicities of radiation therapy with the patient and her husband. Patient would like to think about this issue over the next week and will call me back when she has made her decision.    _____________________________________   Billie Lade, PhD, MD

## 2012-02-25 NOTE — Addendum Note (Signed)
Encounter addended by: Amanda Pea, RN on: 02/25/2012  5:36 PM<BR>     Documentation filed: Charges VN

## 2012-02-29 ENCOUNTER — Ambulatory Visit (INDEPENDENT_AMBULATORY_CARE_PROVIDER_SITE_OTHER): Payer: Medicare Other | Admitting: General Surgery

## 2012-02-29 ENCOUNTER — Encounter (INDEPENDENT_AMBULATORY_CARE_PROVIDER_SITE_OTHER): Payer: Self-pay | Admitting: General Surgery

## 2012-02-29 VITALS — BP 121/68 | HR 64 | Temp 97.2°F | Resp 20 | Ht 67.0 in | Wt 145.6 lb

## 2012-02-29 DIAGNOSIS — Z853 Personal history of malignant neoplasm of breast: Secondary | ICD-10-CM

## 2012-02-29 NOTE — Progress Notes (Signed)
Subjective:     Patient ID: [name], female   DOB: 06-04-1929, 78 y.o.   MRN: [id number]  HPI This is an 78 year old female who underwent a lumpectomy with re-excision for margins and a sentinel lymph node biopsy for a stage I left breast cancer. She has done well from surgery. This required excision of her nipple areolar complex as well. She has no complaints referable to the breast over the surgical site all. She has no real complaints about the other breast at all. She has some pain in her scapula on the left side I do not think this is related to her surgery. She was on antiestrogen therapy was not able to tolerate this. She is now discussed with radiation therapy doing this in an adjuvant setting to try to thread local recurrence. She comes in today for a regularly scheduled followup.  Review of Systems     Objective:   Physical Exam  Vitals reviewed. Constitutional: She appears well-developed and well-nourished.  Neck: Neck supple.  Pulmonary/Chest: Right breast exhibits no inverted nipple, no mass, no nipple discharge, no skin change and no tenderness. Left breast exhibits no mass, no skin change and no tenderness. Breasts are symmetrical.    Lymphadenopathy:    She has no cervical adenopathy.       Assessment:     History stage I left breast cancer treated with lumpectomy/snbx/re-excision margins Could not tolerate AE therapy     Plan:     Think overall she's doing well. She has no clinical evidence of any recurrence. I discussed with her for a while but open to scapular pain is related to her surgery after examining her. I think this is more likely musculoskeletal. I do think that some attempt to reduce a local recurrence with radiation therapy even given at the time his labs between the 2 is a good idea. She is likely going to proceed with this and I am in agreement with that. I will plan on seeing her back in about 9 months.

## 2012-02-29 NOTE — Patient Instructions (Signed)

## 2012-03-03 ENCOUNTER — Telehealth: Payer: Self-pay | Admitting: *Deleted

## 2012-03-07 ENCOUNTER — Telehealth: Payer: Self-pay | Admitting: *Deleted

## 2012-03-07 NOTE — Telephone Encounter (Signed)
Pt called stating she went to [facility] for consultation per Dr Roselind Messier. She is requesting Dr Roselind Messier call her to discuss radiation treatment. She has questions. Informed pt will route her request to Dr Roselind Messier.

## 2012-03-08 ENCOUNTER — Ambulatory Visit (INDEPENDENT_AMBULATORY_CARE_PROVIDER_SITE_OTHER): Payer: Medicare Other | Admitting: Endocrinology

## 2012-03-08 ENCOUNTER — Encounter: Payer: Self-pay | Admitting: Endocrinology

## 2012-03-08 VITALS — BP 142/70 | HR 72 | Temp 98.5°F | Ht 67.0 in | Wt 147.0 lb

## 2012-03-08 DIAGNOSIS — E109 Type 1 diabetes mellitus without complications: Secondary | ICD-10-CM

## 2012-03-08 MED ORDER — GLUCOSE BLOOD VI STRP: ORAL_STRIP | Status: DC

## 2012-03-08 NOTE — Patient Instructions (Addendum)
good diet and exercise habits significanly improve the control of your diabetes.  please let me know if you wish to be referred to a dietician.  high blood sugar is very risky to your health.  you should see an eye doctor every year. controlling your blood pressure and cholesterol drastically reduces the damage diabetes does to your body.  this also applies to quitting smoking.  please discuss these with your doctor.  you should take an aspirin every day, unless you have been advised by a doctor not to. check your blood sugar 8 times a day.  vary the time of day when you check, between before the 3 meals, and at bedtime.  also check if you have symptoms of your blood sugar being too high or too low.  please keep a record of the readings and bring it to your next appointment here.  please call us sooner if your blood sugar goes below 70, or if it stays over 200.   The first goal is to reduce the frequency of your low-blood sugar.   Reduce your basal rate to 0.4 units/hr, 24 hrs per day.  continue the same mealtime bolus.  continue the same correction bolus (which some people call "sensitivity," or "insulin sensitivity ratio," or just "isr") of 1 unit for each by which your glucose exceeds 100.  Please bring your pump settings when you return.  Please come back for a follow-up appointment in 2 months.

## 2012-03-08 NOTE — Progress Notes (Signed)
Subjective:    Patient ID: [name], female    DOB: 1928-08-15, 78 y.o.   MRN: [id number]  HPI pt states 6 years h/o dm.  she is unaware of any chronic complications.  she has been on insulin since dx, and pump rx since soon thereafter.  pt says her diet and exercise are both good.  She declines continuous glucose monitor. Pt reports few mos of slight swelling at the anterior neck, and assoc hoarseness.    She reports hypoglycemia approx qod, usually before breakfast.  She takes approximately 45 units of apidra per day, via her pump.   Past Medical History  Diagnosis Date  . Hypothyroid   . Hypercholesteremia   . Hypertension   . Diabetes mellitus type 1     type I- wears insulin pump  . Osteoarthritis   . Cardiac arrhythmia due to congenital heart disease   . Thyroid nodule dx 07/2011    [place] q 82mo to follow calcified L thyroid nodule (12/08/11 [place])  . Osteoporosis, post-menopausal     DEXA 12/02/11: -3.5 (with lomax gyn), declines bisphos due to bone pain  . Spondylosis     thoracic spine  . Breast cancer   . Cancer of upper-outer quadrant of female breast 08/21/2011    ER +  PR +  Her 2 -  Ki67 9%   0.9 cm invasive lobular  Carcinoma ,s/p central lumpectomy with sentinel node biopsy,  ER/PR positive s/p re-excion on 09/16/11 with final pathology showing atypical hyperplasia     Past Surgical History  Procedure Date  . Bladder repair 5 yrs ago  . Tonsillectomy  age 10  . Appendectomy age 87  . Abdominal hysterectomy yrs ago  . Breast surgery  yrs ago    br bx  . Breast surgery 01/ 23/2013    left central lumpectomy and snbx, re-excision lumpectomy    History   Social History  . Marital Status: Married    Spouse Name: N/A    Number of Children: N/A  . Years of Education: N/A   Occupational History  . Not on file.   Social History Main Topics  . Smoking status: Former Smoker -- 1.0 packs/day for 10 years    Types: Cigarettes    Quit date: 08/27/1977  . Smokeless  tobacco: Never Used  . Alcohol Use: Yes     rare  . Drug Use: No  . Sexually Active: Yes   Other Topics Concern  . Not on file   Social History Narrative  . No narrative on file    Current Outpatient Prescriptions on File Prior to Visit  Medication Sig Dispense Refill  . ALPRAZolam (XANAX) 0.5 MG tablet Take 0.25 mg by mouth as needed. Anxiety       . aspirin 81 MG tablet Take 81 mg by mouth daily.      . Calcium Citrate-Vitamin D (CITRACAL + D PO) Take 1,000 mg by mouth daily.      . cholecalciferol (VITAMIN D) 1000 UNITS tablet Take 1,000 Units by mouth 2 (two) times daily.       . fish oil-omega-3 fatty acids 1000 MG capsule Take 2 g by mouth daily.      . insulin glulisine (APIDRA) 100 UNIT/ML injection Dose is variable. Use as indicated in insulin pump. 4 vials per month  40 mL  1  . Insulin Human (INSULIN PUMP) 100 unit/ml SOLN Inject 0.425-0.725 each into the skin every hour. Apidra (insulin glulisine)12 am-2  am 0.600unit; 3 am-12 pm  0.725 units; 12 pm- 7 pm 0.425 units; 7pm - 12 am 0.450 units. Total basel rate is for the day is 13.95 units      . Lancets (ONETOUCH ULTRASOFT) lancets Daily.      [name] levothyroxine (SYNTHROID, LEVOTHROID) 75 MCG tablet Take 75 mcg by mouth daily.      . ONE TOUCH ULTRA TEST test strip Daily.      . simvastatin (ZOCOR) 20 MG tablet Take 20 mg by mouth at bedtime.         Allergies  Allergen Reactions  . Augmentin (Amoxicillin-Pot Clavulanate) Other (See Comments)    Gi upset only no rash or hives   . Keflex (Cephalexin) Hives and Nausea Only    Stomach upset  . Hydrocodone Rash    Rash to chest , side back  . Latex Rash    Powder in gloves causes rash; "not allergic to latex just the powder"    Family History  Problem Relation Age of Onset  . Cancer Sister     unknown  . Hypertension Mother   . Stroke Mother   . Arthritis Father   . Heart disease Father   . Diabetes Son     BP 142/70  Pulse 72  Temp 98.5 F (36.9 C) (Oral)   Ht 5\' 7"  (1.702 m)  Wt 147 lb (66.679 kg)  BMI 23.02 kg/m2  SpO2 95%  Review of Systems denies weight loss, blurry vision, headache, chest pain, n/v, urinary frequency, excessive diaphoresis, memory loss, depression, menopausal sxs, and rhinorrhea.  She has slight doe, easy bruising, and leg cramps.    Objective:   Physical Exam VS: see vs page GEN: no distress HEAD: head: no deformity eyes: no periorbital swelling, no proptosis external nose and ears are normal mouth: no lesion seen NECK: supple, thyroid is not enlarged.  i cannot detect the thyroid nodule CHEST WALL: no deformity LUNGS:  Clear to auscultation.   CV: reg rate and rhythm, no murmur ABD: abdomen is soft, nontender.  no hepatosplenomegaly.  not distended.  no hernia MUSCULOSKELETAL: muscle bulk and strength are grossly normal.  no obvious joint swelling.  gait is normal and steady EXTEMITIES: no deformity.  no ulcer on the feet.  feet are of normal color and temp.  no edema PULSES: dorsalis pedis intact bilat.  no carotid bruit NEURO:  cn 2-12 grossly intact.   readily moves all 4's.  sensation is intact to touch on the feet SKIN:  Normal texture and temperature.  No rash or suspicious lesion is visible.   NODES:  None palpable at the neck PSYCH: alert, oriented x3.  Does not appear anxious nor depressed. Lab Results  Component Value Date   TSH 2.42 02/09/2012   Lab Results  Component Value Date   HGBA1C 7.6* 02/09/2012  (i reviewed thyroid ultrasound result)    Assessment & Plan:  DM.  Given her advanced age and health probs, this amount of hypoglycemia poses a high risk to her health. Thyroid nodule, stable Leg cramps, possibly due to DM Hypothyroidism, well-replaced

## 2012-03-17 ENCOUNTER — Inpatient Hospital Stay (HOSPITAL_COMMUNITY)
Admission: AD | Admit: 2012-03-17 | Discharge: 2012-03-17 | Disposition: A | Discharge status: Home / Self Care | Payer: Medicare Other | Source: Other Acute Inpatient Hospital | Attending: Internal Medicine | Admitting: Internal Medicine

## 2012-03-17 ENCOUNTER — Encounter (HOSPITAL_COMMUNITY): Payer: Self-pay | Admitting: General Practice

## 2012-03-17 ENCOUNTER — Inpatient Hospital Stay (HOSPITAL_COMMUNITY)
Admission: AD | Admit: 2012-03-17 | Discharge: 2012-03-18 | DRG: 287 | Disposition: A | Discharge status: Home / Self Care | Payer: Medicare Other | Source: Ambulatory Visit | Attending: Internal Medicine | Admitting: Internal Medicine

## 2012-03-17 DIAGNOSIS — R079 Chest pain, unspecified: Secondary | ICD-10-CM | POA: Diagnosis not present

## 2012-03-17 DIAGNOSIS — E109 Type 1 diabetes mellitus without complications: Secondary | ICD-10-CM

## 2012-03-17 DIAGNOSIS — Z79899 Other long term (current) drug therapy: Secondary | ICD-10-CM | POA: Diagnosis not present

## 2012-03-17 DIAGNOSIS — M810 Age-related osteoporosis without current pathological fracture: Secondary | ICD-10-CM | POA: Diagnosis present

## 2012-03-17 DIAGNOSIS — M47814 Spondylosis without myelopathy or radiculopathy, thoracic region: Secondary | ICD-10-CM | POA: Diagnosis present

## 2012-03-17 DIAGNOSIS — I10 Essential (primary) hypertension: Secondary | ICD-10-CM | POA: Diagnosis present

## 2012-03-17 DIAGNOSIS — C50919 Malignant neoplasm of unspecified site of unspecified female breast: Secondary | ICD-10-CM | POA: Diagnosis present

## 2012-03-17 DIAGNOSIS — I2510 Atherosclerotic heart disease of native coronary artery without angina pectoris: Secondary | ICD-10-CM | POA: Diagnosis present

## 2012-03-17 DIAGNOSIS — M1990 Unspecified osteoarthritis, unspecified site: Secondary | ICD-10-CM | POA: Diagnosis present

## 2012-03-17 DIAGNOSIS — R55 Syncope and collapse: Principal | ICD-10-CM | POA: Diagnosis present

## 2012-03-17 DIAGNOSIS — Z853 Personal history of malignant neoplasm of breast: Secondary | ICD-10-CM

## 2012-03-17 DIAGNOSIS — Z794 Long term (current) use of insulin: Secondary | ICD-10-CM

## 2012-03-17 DIAGNOSIS — Z9641 Presence of insulin pump (external) (internal): Secondary | ICD-10-CM

## 2012-03-17 DIAGNOSIS — E7800 Pure hypercholesterolemia, unspecified: Secondary | ICD-10-CM | POA: Diagnosis present

## 2012-03-17 DIAGNOSIS — C50419 Malignant neoplasm of upper-outer quadrant of unspecified female breast: Secondary | ICD-10-CM | POA: Diagnosis present

## 2012-03-17 DIAGNOSIS — Z7982 Long term (current) use of aspirin: Secondary | ICD-10-CM | POA: Diagnosis not present

## 2012-03-17 DIAGNOSIS — R072 Precordial pain: Secondary | ICD-10-CM

## 2012-03-17 DIAGNOSIS — Z87891 Personal history of nicotine dependence: Secondary | ICD-10-CM

## 2012-03-17 DIAGNOSIS — E119 Type 2 diabetes mellitus without complications: Secondary | ICD-10-CM | POA: Diagnosis present

## 2012-03-17 DIAGNOSIS — E039 Hypothyroidism, unspecified: Secondary | ICD-10-CM | POA: Diagnosis present

## 2012-03-17 DIAGNOSIS — K219 Gastro-esophageal reflux disease without esophagitis: Secondary | ICD-10-CM | POA: Diagnosis present

## 2012-03-17 DIAGNOSIS — M129 Arthropathy, unspecified: Secondary | ICD-10-CM | POA: Diagnosis present

## 2012-03-17 HISTORY — DX: Unspecified osteoarthritis, unspecified site: M19.90

## 2012-03-17 HISTORY — DX: Paroxysmal atrial fibrillation: I48.0

## 2012-03-17 HISTORY — DX: Hypo-osmolality and hyponatremia: E87.1

## 2012-03-17 HISTORY — PX: CARDIOVASCULAR STRESS TEST: SHX262

## 2012-03-17 HISTORY — DX: Gastro-esophageal reflux disease without esophagitis: K21.9

## 2012-03-17 MED ORDER — ENOXAPARIN SODIUM 80 MG/0.8ML ~~LOC~~ SOLN: 1.0000 mg/kg | SUBCUTANEOUS | Status: DC

## 2012-03-17 MED ORDER — ASPIRIN EC 325 MG PO TBEC
325.0000 mg | DELAYED_RELEASE_TABLET | Freq: Every day | ORAL | Status: DC
  Administered 2012-03-17: 325 mg via ORAL
  Filled 2012-03-17: qty 1

## 2012-03-17 MED ORDER — ASPIRIN 81 MG PO CHEW
324.0000 mg | CHEWABLE_TABLET | ORAL | Status: AC
  Administered 2012-03-18: 324 mg via ORAL
  Filled 2012-03-17: qty 4

## 2012-03-17 MED ORDER — SODIUM CHLORIDE 0.9 % IJ SOLN: 3.0000 mL | INTRAMUSCULAR | Status: DC | PRN

## 2012-03-17 MED ORDER — SODIUM CHLORIDE 0.9 % IJ SOLN: 3.0000 mL | Freq: Two times a day (BID) | INTRAMUSCULAR | Status: DC

## 2012-03-17 MED ORDER — SODIUM CHLORIDE 0.9 % IJ SOLN
3.0000 mL | Freq: Two times a day (BID) | INTRAMUSCULAR | Status: DC
  Administered 2012-03-17: 3 mL via INTRAVENOUS

## 2012-03-17 MED ORDER — CLOPIDOGREL BISULFATE 75 MG PO TABS
75.0000 mg | ORAL_TABLET | ORAL | Status: AC
  Administered 2012-03-18: 75 mg via ORAL
  Filled 2012-03-17: qty 1

## 2012-03-17 MED ORDER — CLOPIDOGREL BISULFATE 75 MG PO TABS: 75.0000 mg | ORAL_TABLET | Freq: Every day | ORAL | Status: DC

## 2012-03-17 MED ORDER — SODIUM CHLORIDE 0.9 % IV SOLN: 250.0000 mL | INTRAVENOUS | Status: DC | PRN

## 2012-03-17 MED ORDER — ASPIRIN EC 325 MG PO TBEC: 325.0000 mg | DELAYED_RELEASE_TABLET | Freq: Every day | ORAL | Status: DC

## 2012-03-17 MED ORDER — SODIUM CHLORIDE 0.9 % IV SOLN
INTRAVENOUS | Status: DC
  Administered 2012-03-18: 04:00:00 via INTRAVENOUS

## 2012-03-17 NOTE — H&P (Signed)
[name] is an 78 y.o. female.   Chief Complaint: "I am here for a heart catheterization" HPI: The patient is a pleasant 78 yo woman with a h/o DM, HTN, and remote tobacco abuse. She was in her usual state of health until yesterday when she experienced a syncopal episode. The patient awoke after a few seconds and experienced chest pain and sob. She presented to the hospital at [facility] where she was found to rule out for MI and a stess echo was positive clinically and electrically and her echo was described as abnormal though I do not have details. A cardiac cath was recommended and she presents for additional evaluation. She has recently begun XRT for breast CA.  Past Medical History  Diagnosis Date  . Hypothyroid   . Hypercholesteremia   . Hypertension   . Diabetes mellitus type 1     type I- wears insulin pump  . Osteoarthritis   . Cardiac arrhythmia due to congenital heart disease   . Thyroid nodule dx 07/2011    [place] q 17mo to follow calcified L thyroid nodule (12/08/11 [place])  . Osteoporosis, post-menopausal     DEXA 12/02/11: -3.5 (with lomax gyn), declines bisphos due to bone pain  . Spondylosis     thoracic spine  . Breast cancer   . Cancer of upper-outer quadrant of female breast 08/21/2011    ER +  PR +  Her 2 -  Ki67 9%   0.9 cm invasive lobular  Carcinoma ,s/p central lumpectomy with sentinel node biopsy,  ER/PR positive s/p re-excion on 09/16/11 with final pathology showing atypical hyperplasia     Past Surgical History  Procedure Date  . Bladder repair 5 yrs ago  . Tonsillectomy  age 10  . Appendectomy age 89  . Abdominal hysterectomy yrs ago  . Breast surgery  yrs ago    br bx  . Breast surgery 01/ 23/2013    left central lumpectomy and snbx, re-excision lumpectomy    Family History  Problem Relation Age of Onset  . Cancer Sister     unknown  . Hypertension Mother   . Stroke Mother   . Arthritis Father   . Heart disease Father   . Diabetes Son     Social History:  reports that she quit smoking about 34 years ago. Her smoking use included Cigarettes. She has a 10 pack-year smoking history. She has never used smokeless tobacco. She reports that she drinks alcohol. She reports that she does not use illicit drugs.  Allergies:  Allergies  Allergen Reactions  . Augmentin (Amoxicillin-Pot Clavulanate) Other (See Comments)    Gi upset only no rash or hives   . Keflex (Cephalexin) Hives and Nausea Only    Stomach upset  . Hydrocodone Rash    Rash to chest , side back  . Latex Rash    Powder in gloves causes rash; "not allergic to latex just the powder"    Medications Prior to Admission  Medication Sig Dispense Refill  . ALPRAZolam (XANAX) 0.5 MG tablet Take 0.25 mg by mouth 3 (three) times daily as needed. For Anxiety      . aspirin 81 MG tablet Take 81 mg by mouth daily.      [name] BIOGAIA PROBIOTIC (BIOGAIA PROBIOTIC) LIQD 3oz before breakfast      . Calcium Citrate-Vitamin D (CITRACAL + D PO) Take 1,000 mg by mouth daily.      . cholecalciferol (VITAMIN D) 1000 UNITS tablet Take  1,000 Units by mouth daily.       . fish oil-omega-3 fatty acids 1000 MG capsule Take 2 g by mouth daily.      [name] glucose blood (ONE TOUCH ULTRA TEST) test strip 8 tests/day, and lancets 250.03  240 each  3  . insulin glulisine (APIDRA) 100 UNIT/ML injection Dose is variable. Use as indicated in insulin pump. 4 vials per month  40 mL  1  . Insulin Human (INSULIN PUMP) 100 unit/ml SOLN Inject 0.425-0.725 each into the skin every hour.       . IV Sets-Tubing (INFUSION SET 24" ) MISC 1 Device by Does not apply route every 3 (three) days.      [name] levothyroxine (SYNTHROID, LEVOTHROID) 75 MCG tablet Take 75 mcg by mouth daily.      . Multiple Vitamin (MULTIVITAMIN) tablet Take 1 tablet by mouth daily. For breast and bone health      . simvastatin (ZOCOR) 20 MG tablet Take 20 mg by mouth at bedtime.       . vitamin C (ASCORBIC ACID) 500 MG tablet Take 1,000 mg by  mouth daily.        No results found for this or any previous visit (from the past 48 hour(s)). No results found.  @ROS @ All systems were reviewed and negative except as noted in the HPI.  Physical exam  P- 68, R - 16, BP - 134/78  Well appearing NAD HEENT: Unremarkable Neck:  No JVD, no thyromegally Lymphatics:  No adenopathy Back:  No CVA tenderness Lungs:  Clear HEART:  Regular rate rhythm, no murmurs, no rubs, no clicks Abd:  Flat, positive bowel sounds, no organomegally, no rebound, no guarding Ext:  2 plus pulses, no edema, no cyanosis, no clubbing Skin:  No rashes no nodules Neuro:  CN II through XII intact, motor grossly intact   EKG:NSR with septal MI  Assessment/Plan 1. [place] 2.syncope 3. HTN 4. DM Rec; I have discussed the risks/benefits/goals/expectations of left heart cath with the patient and she wishes to proceed.   Buel Ream.D. 03/17/2012, 6:05 PM

## 2012-03-17 NOTE — Progress Notes (Signed)
Patient arrived on unit via [facility].  Admitting MD notified of patient's arrival.  Will continue to monitor. [name]

## 2012-03-17 NOTE — H&P (Signed)
  [name] is an 78 y.o. female.   Chief Complaint: "chest pain" HPI: The patient is a very pleasant 78 yo woman with a h/o HTN, DM, and recent diagnosis of breast CA for which she has just begun chemotherapy. She had a frank syncopal episode yesterday and was out only a few seconds with no warning. She awoke and began to experience sscp, radiating into the left neck. She was admitted to the hospital where she ruled out for MI. She underwent stress echo which was electrically positive, clinically positive and echo images described as abnormal. She was recommended heart catheterization and she requested transfer for additional evaluation. She has not ever passed out before. No prior chest pain or sob or edema.  Past Medical History  Diagnosis Date  . Hypertension   . Hyponatremia   . Hypothyroidism   . Diabetes mellitus     type wears insulin pump  . GERD (gastroesophageal reflux disease)   . Cancer     left breast  . Arthritis     fingers    Past Surgical History  Procedure Date  . Cardiovascular stress test 03/17/2012  . Breast surgery     biopsy  . Breast lumpectomy   . Appendectomy   . Abdominal hysterectomy     History reviewed. No pertinent family history. Social History:  reports that she quit smoking about 32 years ago. She has never used smokeless tobacco. She reports that she drinks alcohol. She reports that she does not use illicit drugs.  Allergies: Allergies not on file  No prescriptions prior to admission    Results for orders placed during the hospital encounter of 03/17/12 (from the past 48 hour(s))  GLUCOSE, CAPILLARY     Status: Abnormal   Collection Time   03/17/12  4:37 PM      Component Value Range Comment   Glucose-Capillary 121 (*) 70 - 99 mg/dL    No results found.  ROS - all systems reviewed and negative except as noted in the HPI.  Blood pressure 165/83, pulse 81, temperature 98.6 F (37 C), temperature source Oral, resp. rate 16, height 5\' 7"   (1.702 m), weight 140 lb (63.504 kg), SpO2 96.00%. Well appearing elderly woman, NAD HEENT: Unremarkable Neck:  No JVD, no thyromegally Lymphatics:  No adenopathy Back:  No CVA tenderness Lungs:  Clear with no wheezes, rales or rhonchi HEART:  Regular rate rhythm, no murmurs, no rubs, no clicks Abd:  Flat, positive bowel sounds, no organomegally, no rebound, no guarding Ext:  2 plus pulses, no edema, no cyanosis, no clubbing Skin:  No rashes no nodules Neuro:  CN II through XII intact, motor grossly intact  EKG: NSR, septal infarct  Assessment/Plan 1. Chest pain 2. Abnormal stress echo 3. HTN 4. DM Rec: I havd discussed the indications for left heart cath with the patient and she wishes to proceed. Will continue her prior hospital meds.   Lewayne Bunting 03/17/2012, 4:51 PM

## 2012-03-17 NOTE — Progress Notes (Signed)
The Diabetes Coordinator was notified of patient's insulin pump and questioned about pre-cath/post-cath procedure for using pump.  Coordinator advised patient to wear pump to cath lab, then once in the lab, suspend and unhook pump, and then hand to [employer].  After procedure, patient to attach and activate pump.  Will pass on information to night RN to pass on to day shift nurse. Nolon Nations

## 2012-03-18 ENCOUNTER — Ambulatory Visit (HOSPITAL_COMMUNITY): Admit: 2012-03-18 | Payer: Self-pay | Admitting: Cardiology

## 2012-03-18 ENCOUNTER — Encounter (HOSPITAL_COMMUNITY): Payer: Self-pay

## 2012-03-18 ENCOUNTER — Encounter (HOSPITAL_COMMUNITY): Payer: Self-pay | Admitting: *Deleted

## 2012-03-18 ENCOUNTER — Encounter (HOSPITAL_COMMUNITY)
Admission: AD | Disposition: A | Discharge status: Home / Self Care | Payer: Self-pay | Source: Ambulatory Visit | Attending: Internal Medicine

## 2012-03-18 DIAGNOSIS — R079 Chest pain, unspecified: Secondary | ICD-10-CM

## 2012-03-18 DIAGNOSIS — R55 Syncope and collapse: Secondary | ICD-10-CM | POA: Diagnosis present

## 2012-03-18 DIAGNOSIS — R072 Precordial pain: Secondary | ICD-10-CM | POA: Diagnosis present

## 2012-03-18 HISTORY — PX: LEFT HEART CATHETERIZATION WITH CORONARY ANGIOGRAM: SHX5451

## 2012-03-18 LAB — CBC
Hemoglobin: 12 g/dL (ref 12.0–15.0)
MCH: 29.4 pg (ref 26.0–34.0)
MCHC: 34.8 g/dL (ref 30.0–36.0)
Platelets: ADEQUATE 10*3/uL (ref 150–400)
RDW: 14.7 % (ref 11.5–15.5)

## 2012-03-18 LAB — DIFFERENTIAL
Basophils Relative: 0 % (ref 0–1)
Eosinophils Absolute: 0.2 10*3/uL (ref 0.0–0.7)
Eosinophils Relative: 3 % (ref 0–5)
Lymphocytes Relative: 29 % (ref 12–46)
Monocytes Absolute: 0.7 10*3/uL (ref 0.1–1.0)
Neutrophils Relative %: 57 % (ref 43–77)
Smear Review: ADEQUATE

## 2012-03-18 LAB — COMPREHENSIVE METABOLIC PANEL
ALT: 26 U/L (ref 0–35)
CO2: 18 mEq/L — ABNORMAL LOW (ref 19–32)
Calcium: 9.5 mg/dL (ref 8.4–10.5)
Chloride: 102 mEq/L (ref 96–112)
GFR calc Af Amer: 90 mL/min — ABNORMAL LOW (ref >90)
GFR calc non Af Amer: 78 mL/min — ABNORMAL LOW (ref >90)
Glucose, Bld: 210 mg/dL — ABNORMAL HIGH (ref 70–99)
Sodium: 135 mEq/L (ref 135–145)
Total Bilirubin: 0.5 mg/dL (ref 0.3–1.2)

## 2012-03-18 LAB — PROTIME-INR
INR: 1.16 (ref 0.00–1.49)
Prothrombin Time: 15 seconds (ref 11.6–15.2)

## 2012-03-18 LAB — GLUCOSE, CAPILLARY
Glucose-Capillary: 130 mg/dL — ABNORMAL HIGH (ref 70–99)
Glucose-Capillary: 272 mg/dL — ABNORMAL HIGH (ref 70–99)

## 2012-03-18 SURGERY — LEFT HEART CATHETERIZATION WITH CORONARY ANGIOGRAM
Anesthesia: LOCAL

## 2012-03-18 MED ORDER — ONDANSETRON HCL 4 MG/2ML IJ SOLN: 4.0000 mg | Freq: Four times a day (QID) | INTRAMUSCULAR | Status: DC | PRN

## 2012-03-18 MED ORDER — NITROGLYCERIN 0.2 MG/ML ON CALL CATH LAB
INTRAVENOUS | Status: AC
  Filled 2012-03-18: qty 1

## 2012-03-18 MED ORDER — INSULIN PUMP
Freq: Three times a day (TID) | SUBCUTANEOUS | Status: DC
  Administered 2012-03-18: 5 via SUBCUTANEOUS
  Administered 2012-03-18: 2.55 via SUBCUTANEOUS
  Filled 2012-03-18: qty 1

## 2012-03-18 MED ORDER — ACETAMINOPHEN 325 MG PO TABS: 650.0000 mg | ORAL_TABLET | ORAL | Status: DC | PRN

## 2012-03-18 MED ORDER — SIMVASTATIN 20 MG PO TABS
20.0000 mg | ORAL_TABLET | Freq: Every day | ORAL | Status: DC
  Filled 2012-03-18: qty 1

## 2012-03-18 MED ORDER — HEPARIN (PORCINE) IN NACL 2-0.9 UNIT/ML-% IJ SOLN
INTRAMUSCULAR | Status: AC
  Filled 2012-03-18: qty 2000

## 2012-03-18 MED ORDER — LABETALOL HCL 5 MG/ML IV SOLN
INTRAVENOUS | Status: AC
  Filled 2012-03-18: qty 4

## 2012-03-18 MED ORDER — SODIUM CHLORIDE 0.9 % IV SOLN
INTRAVENOUS | Status: DC
  Administered 2012-03-18: 17:00:00 via INTRAVENOUS

## 2012-03-18 MED ORDER — OMEGA-3 FATTY ACIDS 1000 MG PO CAPS: 2.0000 g | ORAL_CAPSULE | Freq: Every day | ORAL | Status: DC

## 2012-03-18 MED ORDER — MIDAZOLAM HCL 2 MG/2ML IJ SOLN
INTRAMUSCULAR | Status: AC
  Filled 2012-03-18: qty 2

## 2012-03-18 MED ORDER — LIDOCAINE HCL (PF) 1 % IJ SOLN
INTRAMUSCULAR | Status: AC
  Filled 2012-03-18: qty 30

## 2012-03-18 MED ORDER — OMEGA-3-ACID ETHYL ESTERS 1 G PO CAPS
2.0000 g | ORAL_CAPSULE | Freq: Every day | ORAL | Status: DC
  Administered 2012-03-18: 2 g via ORAL
  Filled 2012-03-18: qty 2

## 2012-03-18 MED ORDER — LEVOTHYROXINE SODIUM 75 MCG PO TABS
75.0000 ug | ORAL_TABLET | Freq: Every day | ORAL | Status: DC
  Administered 2012-03-18: 75 ug via ORAL
  Filled 2012-03-18 (×2): qty 1

## 2012-03-18 NOTE — Progress Notes (Signed)
Utilization review completed.  

## 2012-03-18 NOTE — Progress Notes (Signed)
Cardiology Progress Note Patient Name: [name] Date of Encounter: 03/18/2012, 10:26 AM     Subjective  No overnight events. Patient denies chest pain or sob. No arrhythmias on telemetry.    Objective   Telemetry: Sinus rhythm 60-70s  Medications: . aspirin  324 mg Oral Pre-Cath  . aspirin EC  325 mg Oral Daily  . clopidogrel  75 mg Oral Pre-Cath  . insulin pump   Subcutaneous TID AC, HS, 0200  . sodium chloride  3 mL Intravenous Q12H  . sodium chloride  3 mL Intravenous Q12H   . sodium chloride 75 mL/hr at 03/18/12 0408    Physical Exam: Temp:  [98.2 F (36.8 C)-98.3 F (36.8 C)] 98.3 F (36.8 C) (08/09 0621) Pulse Rate:  [65-78] 65  (08/09 0621) Resp:  [16] 16  (08/09 0621) BP: (126-131)/(71-76) 131/71 mmHg (08/09 0621) SpO2:  [93 %-94 %] 94 % (08/09 0621) Weight:  [150 lb 1.6 oz (68.085 kg)] 150 lb 1.6 oz (68.085 kg) (08/09 4540)  General: Pleasant elderly white female, in no acute distress. Head: Normocephalic, atraumatic, sclera non-icteric, nares are without discharge.  Neck: Supple. Negative for carotid bruits or JVD Lungs: Clear bilaterally to auscultation without wheezes, rales, or rhonchi. Breathing is unlabored. Heart: RRR S1 S2 without murmurs, rubs, or gallops.  Abdomen: Soft, non-tender, non-distended with normoactive bowel sounds. No rebound/guarding. No obvious abdominal masses. Msk:  Strength and tone appear normal for age. Extremities: No edema. No clubbing or cyanosis. Distal pedal pulses are intact and equal bilaterally. Neuro: Alert and oriented X 3. Moves all extremities spontaneously. Psych:  Responds to questions appropriately with a normal affect.   Intake/Output Summary (Last 24 hours) at 03/18/12 1026 Last data filed at 03/17/12 2248  Gross per 24 hour  Intake      3 ml  Output      0 ml  Net      3 ml    Labs:  [facility] 03/18/12 0649  NA 135  K 4.1  CL 102  CO2 18*  GLUCOSE 210*  BUN 11  CREATININE 0.76  CALCIUM 9.5    Basename 03/18/12 0649  AST 35  ALT 26  ALKPHOS 69  BILITOT 0.5  PROT 7.0  ALBUMIN 3.5   Basename 03/18/12 0649  WBC 6.2  NEUTROABS 3.5  HGB 12.0  HCT 34.5*  MCV 84.6  PLT PLATELETS APPEAR ADEQUATE     02/09/2012 10:37  Hemoglobin A1C 7.6 (H)     02/09/2012 10:37  TSH 2.42   Radiology/Studies:  No results found.   Assessment and Plan  78 y.o. female w/ PMHx significant for PAF, Type1 DM, HLD, HTN, Hypothyroidism, Breast CA who transferred to [facility] on 03/17/12 for cardiac cath after a syncopal episode and subsequent abnormal stress echo.  1. Unstable Angina 2. Syncope 3. Hyperlipidemia 4. Hypertension 5. Type 1 Diabetes Mellitus 6. Paroxysmal Atrial Fibrillation (One episode in 2008, spontaneously converted in the ED, no further treatment)  Patient presented to [facility] after experiencing a syncopal episode followed by chest tightness. She has no prior history of heart disease. On day of episode she had undergone her first radiation treatment for breast cancer. She was in her bathroom and all of a sudden "felt funny" and passed out. She doesn't think she was unconscious for very long, but when she woke up she experienced chest tightness and sob. At [facility] a stress echo was reportedly abnormal for which she  was transferred to [facility] for cardiac catheterization. She is chest pain free now. Cardiac enzymes at [facility] were normal. EKG nonischemic. CT head and CXR without acute findings. Continue to monitor on telemetry. Plans for cardiac cath this afternoon. Had a clear liquid breakfast. Received ASA and Plavix this morning. Crt and electrolytes ok. Resume home statin, fish oil and synthroid. BP stable. Further plans pending cardiac cath. Consider outpatient event monitor if symptoms not explained by cath findings.  Signed, HOPE, [name]  History and all data above reviewed.  Patient examined.  I agree with the findings as above. No further  syncope thought she has had some mild chest pain. The patient exam reveals COR:RRR  ,  Lungs: Clear  ,  Abd: Positive bowel sounds, no rebound no guarding , Ext No edema  .  All available labs, radiology testing, previous records reviewed. Agree with documented assessment and plan. Plan cath as above.    [name]  1:33 PM  03/18/2012

## 2012-03-18 NOTE — Discharge Summary (Addendum)
CARDIOLOGY DISCHARGE SUMMARY   Patient ID: DIVINITY KYLER MRN: [id number] DOB/AGE: 76-Apr-1930 76 y.o.  Admit date: 03/17/2012 Discharge date: 03/18/2012  Primary Discharge Diagnosis:  Syncope with chest pain, positive stress test - no critical CAD at cath  Secondary Discharge Diagnosis:  Past Medical History  Diagnosis Date  . Hypothyroid   . Hypercholesteremia   . Hypertension   . Diabetes mellitus type 1     type I- wears insulin pump  . Osteoarthritis   . PAF (paroxysmal atrial fibrillation)     One episode in 2008, spontaneously converted in the ED, no further treatment  . Thyroid nodule dx 07/2011    [place] q 18mo to follow calcified L thyroid nodule (12/08/11 [place])  . Osteoporosis, post-menopausal     DEXA 12/02/11: -3.5 (with lomax gyn), declines bisphos due to bone pain  . Spondylosis     thoracic spine  . Breast cancer   . Cancer of upper-outer quadrant of female breast 08/21/2011    ER +  PR +  Her 2 -  Ki67 9%   0.9 cm invasive lobular  Carcinoma ,s/p central lumpectomy with sentinel node biopsy,  ER/PR positive s/p re-excion on 09/16/11 with final pathology showing atypical hyperplasia    Procedures: Left Heart Cath, Selective Coronary Angiography, LV angiography   Hospital Course: Ms Erb is an 78 year old female with no history of CAD. She had a syncopal episode and chest pain. She went to [facility]. There, she ruled out for an MI and had a stress echo. The stress echo was reportedly positive and she was transferred to [facility] for further evaluation and catheterization.   Her labs were stable overnight. She had a cardiac cath on 03/18/2012, results below. There was not obstructive disease and her EF was normal. Dr Antoine Poche evaluated the films and felt no further in-patient workup was indicated. Post-procedure, she is ambulating without chest pain or SOB and considered stable for discharge, to follow up as an outpatient. The patient and her husband are aware she should  not drive till cleared by Dr Ladona Ridgel.  Labs:   Lab Results  Component Value Date   WBC 6.2 03/18/2012   HGB 12.0 03/18/2012   HCT 34.5* 03/18/2012   MCV 84.6 03/18/2012   PLT PLATELETS APPEAR ADEQUATE 03/18/2012    Lab 03/18/12 0649  NA 135  K 4.1  CL 102  CO2 18*  BUN 11  CREATININE 0.76  CALCIUM 9.5  PROT 7.0  BILITOT 0.5  ALKPHOS 69  ALT 26  AST 35  GLUCOSE 210*    Basename 03/18/12 0655  INR 1.16   Cardiac Cath: 03/18/2012 Left mainstem: Normal.  Left anterior descending (LAD): Ostial/proximal calcification. Diffuse luminal irregularities. Proximal diagonal large and normal.  Left circumflex (LCx): AV groove mild luminal irregularities. OM1 small and normal. PL large and normal.  Right coronary artery (RCA): Large. Diffuse scattered 25% lesions. PDA moderate and normal. PL large and normal  Left ventriculography: Left ventricular systolic function is normal, LVEF is estimated at 65%, there is no significant mitral regurgitation    EKG: 18-Mar-2012 04:19:58  Normal sinus rhythm Normal ECG No significant change since last tracing Vent. rate 65 BPM PR interval 184 ms QRS duration 76 ms QT/QTc 448/465 ms P-R-T axes 70 63 63  FOLLOW UP PLANS AND APPOINTMENTS Allergies  Allergen Reactions  . Augmentin (Amoxicillin-Pot Clavulanate) Other (See Comments)    Gi upset only no rash or hives   . Keflex (  Cephalexin) Hives and Nausea Only    Stomach upset  . Hydrocodone Rash    Rash to chest , side back  . Latex Rash    Powder in gloves causes rash; "not allergic to latex just the powder"   Medication List  As of 03/18/2012  4:08 PM   TAKE these medications         ALPRAZolam 0.5 MG tablet   Commonly known as: XANAX   Take 0.25 mg by mouth 3 (three) times daily as needed. For Anxiety      aspirin 81 MG tablet   Take 81 mg by mouth daily.      BIOGAIA PROBIOTIC Liqd   3oz before breakfast      cholecalciferol 1000 UNITS tablet   Commonly known as: VITAMIN D   Take  1,000 Units by mouth daily.      CITRACAL + D PO   Take 1,000 mg by mouth daily.      fish oil-omega-3 fatty acids 1000 MG capsule   Take 2 g by mouth daily.      glucose blood test strip   8 tests/day, and lancets 250.03      Infusion Set 24" Misc   1 Device by Does not apply route every 3 (three) days.      insulin glulisine 100 UNIT/ML injection   Commonly known as: APIDRA   Dose is variable. Use as indicated in insulin pump. 4 vials per month      insulin pump 100 unit/ml Soln   Inject 0.425-0.725 each into the skin every hour.      levothyroxine 75 MCG tablet   Commonly known as: SYNTHROID, LEVOTHROID   Take 75 mcg by mouth daily.      multivitamin tablet   Take 1 tablet by mouth daily. For breast and bone health      simvastatin 20 MG tablet   Commonly known as: ZOCOR   Take 20 mg by mouth at bedtime.      vitamin C 500 MG tablet   Commonly known as: ASCORBIC ACID   Take 1,000 mg by mouth daily.           Discharge Orders    Future Appointments: Provider: Department: Dept Phone: Center:   05/11/2012 1:00 PM Newt Lukes, MD [facility] [phone] [facility]   05/17/2012 4:15 PM Romero Belling, MD [facility] [phone] [facility]   08/15/2012 11:00 AM Delcie Roch [facility] [phone] None   08/15/2012 11:30 AM Victorino December, MD [facility] [phone] None     Follow-up Information    Follow up with Lewayne Bunting, MD. (September 26th at 1:45 pm)    Contact information:   [address] [place] [address] [phone]          BRING ALL MEDICATIONS WITH YOU TO FOLLOW UP APPOINTMENTS  Time spent with patient to include physician time: 38 min Signed: Theodore Demark 03/18/2012, 3:42 PM Co-Sign MD

## 2012-03-18 NOTE — Progress Notes (Signed)
Inpatient Diabetes Program Recommendations  AACE/ADA: New Consensus Statement on Inpatient Glycemic Control (2013)  Target Ranges:  Prepandial:   less than 140 mg/dL      Peak postprandial:   less than 180 mg/dL (1-2 hours)      Critically ill patients:  140 - 180 mg/dL   Diabetes Coordinator spoke with RN and reminded her to do the insulin pump assessment q shift.  Insulin pump orders not yet entered. Diabetes Coordinator entered orders with Cosign required.  Patient reported she bolused 5 units this morning at breakfast. She will have cardiac cath later this afternoon.    Thank you  Piedad Climes [facility] Inpatient Diabetes Coordinator [phone]

## 2012-03-18 NOTE — CV Procedure (Signed)
   Cardiac Catheterization Procedure Note  Name: [name] MRN: [id number] DOB: 12-Aug-1928  Procedure: Left Heart Cath, Selective Coronary Angiography, LV angiography  Indication:   Chest pain.  Procedural details: The right groin was prepped, draped, and anesthetized with 1% lidocaine. Using modified Seldinger technique, a 5 French sheath was introduced into the right femoral artery. Standard Judkins catheters were used for coronary angiography and left ventriculography. Catheter exchanges were performed over a guidewire. There were no immediate procedural complications. The patient was transferred to the post catheterization recovery area for further monitoring.  Procedural Findings:   Hemodynamics:     AO 157/70    LV 152/11   Coronary angiography:   Coronary dominance: Right  Left mainstem:   Normal.    Left anterior descending (LAD):   Ostial/proximal calcification.  Diffuse luminal irregularities.  Proximal diagonal large and normal.    Left circumflex (LCx):  AV groove mild luminal irregularities.  OM1 small and normal.  PL large and normal.  Right coronary artery (RCA):  Large.  Diffuse scattered 25% lesions.  PDA moderate and normal.  PL large and normal  Left ventriculography: Left ventricular systolic function is normal, LVEF is estimated at 65%, there is no significant mitral regurgitation   Final Conclusions:  Mild coronary plaque.  NL LV function  Recommendations:  Plan medical management.  Rollene Rotunda 03/18/2012, 2:43 PM

## 2012-03-21 ENCOUNTER — Telehealth: Payer: Self-pay | Admitting: Medical Oncology

## 2012-03-21 ENCOUNTER — Telehealth: Payer: Self-pay | Admitting: *Deleted

## 2012-03-21 ENCOUNTER — Telehealth: Payer: Self-pay | Admitting: Radiation Oncology

## 2012-03-21 ENCOUNTER — Encounter (HOSPITAL_COMMUNITY): Payer: Self-pay | Admitting: *Deleted

## 2012-03-21 LAB — GLUCOSE, CAPILLARY

## 2012-03-21 NOTE — Telephone Encounter (Signed)
Patient phoned requesting to speak with Dr. Roselind Messier. Patient verbalized that following her initial radiation treatment at [facility] she blacked out. She goes on to say she was transported to [facility] then [facility] where she underwent several testing including and ECHO and ultimately ended up having a heart catheterization. Patient states,"I feel like all of this was a result of that radiation treatment and I don't want to have any more." Also, she reports she would rather just go back on Tamoxifen. Instructed patient this message would be routed to Dr. Roselind Messier and he would contact her. Patient verbalized understanding. Routed this message to Dr. Roselind Messier and Edythe Clarity, RN.

## 2012-03-21 NOTE — Telephone Encounter (Signed)
Patient called to report that she was recently hospitalized and she feels that it is related to her first radiation treatment.  "I was fine and then I got my first radiation treatment and I went home and I started feeling funny and then I just passed out.  I went to the emergency room and my blood pressure was 212.  I just want to know if Dr. Welton Flakes thinks this was related to my radiation treatment and if I could stop my radiation treatments and go back on the Tamoxifen?"  Advised patient that she should consult Dr. Roselind Messier who is her radiation doctor and that I would speak with Dr. Welton Flakes about restarting Tamoxifen.  Patient expressed understanding, no further questions at this time.  Will review with MD

## 2012-03-21 NOTE — Telephone Encounter (Signed)
Patient needs to set up an appointment with me as well as speak to Dr. Roselind Messier regarding radiation oncology questions

## 2012-03-21 NOTE — Telephone Encounter (Signed)
gave patient appointment for 03-22-2012 at 1:30pm patient confirmed over the phone

## 2012-03-22 ENCOUNTER — Ambulatory Visit (HOSPITAL_BASED_OUTPATIENT_CLINIC_OR_DEPARTMENT_OTHER): Payer: Medicare Other | Admitting: Oncology

## 2012-03-22 ENCOUNTER — Encounter: Payer: Self-pay | Admitting: Oncology

## 2012-03-22 ENCOUNTER — Telehealth: Payer: Self-pay | Admitting: Oncology

## 2012-03-22 VITALS — BP 173/78 | HR 74 | Temp 98.5°F | Resp 20 | Ht 67.0 in | Wt 144.5 lb

## 2012-03-22 DIAGNOSIS — C50419 Malignant neoplasm of upper-outer quadrant of unspecified female breast: Secondary | ICD-10-CM

## 2012-03-22 DIAGNOSIS — Z170 Estrogen receptor positive status [ER+]: Secondary | ICD-10-CM

## 2012-03-22 DIAGNOSIS — R030 Elevated blood-pressure reading, without diagnosis of hypertension: Secondary | ICD-10-CM

## 2012-03-22 DIAGNOSIS — F411 Generalized anxiety disorder: Secondary | ICD-10-CM

## 2012-03-22 MED ORDER — HYDROCHLOROTHIAZIDE 12.5 MG PO TABS: 12.5000 mg | ORAL_TABLET | Freq: Every day | ORAL | Status: DC

## 2012-03-22 MED ORDER — LORAZEPAM 0.5 MG PO TABS: 0.5000 mg | ORAL_TABLET | Freq: Three times a day (TID) | ORAL | Status: AC

## 2012-03-22 NOTE — Telephone Encounter (Signed)
gve the pt her sept 2013 appt calendar °

## 2012-03-22 NOTE — Progress Notes (Signed)
OFFICE PROGRESS NOTE  CC Dr. Beather Arbour Dr. Antony Blackbird Dr. Emelia Loron Rene Paci, MD [address] [facility] [address] [place] [address]  DIAGNOSIS: 78 year old female Stage I invasive lobular carcinoma s/p central lumpectomy on 09/02/11  PRIOR THERAPY: 1. Status post lumpectomy on 09/02/2011 with the final pathology revealing an invasive grade 1 lobular carcinoma measuring 0.9 cm with lobular carcinoma in situ noted. Medial and superior margins were focally positive. Patient subsequently underwent additional excision of the superior and deep margins that showed benign breast parenchyma with fibrocystic changes no atypia hyperplasia or malignancy. One sentinel node was negative for metastatic disease.  2. Patient Was started on tamoxifen but she has not been able to tolerate it and [place] it was discontinued on 01/07/2012.  3. Patient began radiation therapy last week at [facility]. She has only received 1 dose but then developed hypertension and was seen the ER at [facility] and subsequently transferred to [facility] for work up including a cardiac catherterization  CURRENT THERAPY: Observation and patient will be referred back to radiation oncology  INTERVAL HISTORY: [name] 78 y.o. female returns for Follow up visit .Today she feels better and has no shortness of breath chest pains palpitations. However she does have significant anxiety related to her episode of hypertensive crisis to led to her hospitalization.She feels that all of this was due to the first dose of radiation therapy. She at this time does not want to do radiation. She wants to go back to tamoxifen. However she did have significant side effects from tamoxifen as well and she did not tolerate it we have to take her off of it. Patient and I had an extensive discussion today for half hour regarding this. I relayed to her that I did not think that her hypertension  episode was due to the radiation. I do think there was a significant contribution from her being very anxious about her diagnosis of breast cancer the treatment options. She also has not been on any blood pressure medications. She is in  known hypertensive.I have reviewed her serial blood pressures and she is always hypertensive with an elevated systolic blood pressure.  MEDICAL HISTORY: Past Medical History  Diagnosis Date  . Hyponatremia   . Hypothyroidism   . Diabetes mellitus     type wears insulin pump  . GERD (gastroesophageal reflux disease)   . Cancer     left breast  . Arthritis     fingers  . Hypothyroid   . Hypercholesteremia   . Hypertension   . Diabetes mellitus type 1     type I- wears insulin pump  . Osteoarthritis   . PAF (paroxysmal atrial fibrillation)     One episode in 2008, spontaneously converted in the ED, no further treatment  . Thyroid nodule dx 07/2011    [place] q 57mo to follow calcified L thyroid nodule (12/08/11 [place])  . Osteoporosis, post-menopausal     DEXA 12/02/11: -3.5 (with lomax gyn), declines bisphos due to bone pain  . Spondylosis     thoracic spine  . Breast cancer   . Cancer of upper-outer quadrant of female breast 08/21/2011    ER +  PR +  Her 2 -  Ki67 9%   0.9 cm invasive lobular  Carcinoma ,s/p central lumpectomy with sentinel node biopsy,  ER/PR positive s/p re-excion on 09/16/11 with final pathology showing atypical hyperplasia     ALLERGIES:  is allergic to augmentin; keflex; hydrocodone; and latex.  MEDICATIONS:  Current Outpatient Prescriptions  Medication Sig Dispense Refill  . ALPRAZolam (XANAX) 0.5 MG tablet Take 0.25 mg by mouth 3 (three) times daily as needed. For Anxiety      . aspirin 81 MG tablet Take 81 mg by mouth daily.      [name] BIOGAIA PROBIOTIC (BIOGAIA PROBIOTIC) LIQD 3oz before breakfast      . Calcium Citrate-Vitamin D (CITRACAL + D PO) Take 1,000 mg by mouth daily.      . cholecalciferol (VITAMIN D) 1000 UNITS tablet Take  1,000 Units by mouth daily.       . fish oil-omega-3 fatty acids 1000 MG capsule Take 2 g by mouth daily.      [name] glucose blood (ONE TOUCH ULTRA TEST) test strip 8 tests/day, and lancets 250.03  240 each  3  . insulin glulisine (APIDRA) 100 UNIT/ML injection Dose is variable. Use as indicated in insulin pump. 4 vials per month  40 mL  1  . Insulin Human (INSULIN PUMP) 100 unit/ml SOLN Inject 0.425-0.725 each into the skin every hour.       . IV Sets-Tubing (INFUSION SET 24" ) MISC 1 Device by Does not apply route every 3 (three) days.      [name] levothyroxine (SYNTHROID, LEVOTHROID) 75 MCG tablet Take 75 mcg by mouth daily.      . Multiple Vitamin (MULTIVITAMIN) tablet Take 1 tablet by mouth daily. For breast and bone health      . simvastatin (ZOCOR) 20 MG tablet Take 20 mg by mouth at bedtime.       . vitamin C (ASCORBIC ACID) 500 MG tablet Take 1,000 mg by mouth daily.        SURGICAL HISTORY:  Past Surgical History  Procedure Date  . Cardiovascular stress test 03/17/2012  . Breast surgery     biopsy  . Breast lumpectomy   . Appendectomy   . Abdominal hysterectomy   . Bladder repair 5 yrs ago  . Tonsillectomy  age 28  . Appendectomy age 76  . Abdominal hysterectomy yrs ago  . Breast surgery  yrs ago    br bx  . Breast surgery 01/ 23/2013    left central lumpectomy and snbx, re-excision lumpectomy    REVIEW OF SYSTEMS:  Pertinent items are noted in HPI.   PHYSICAL EXAMINATION:  Breast Exam: left breast with central lumpectomy healing well, no other masses ECOG PERFORMANCE STATUS: 0 - Asymptomatic  Blood pressure 173/78, pulse 74, temperature 98.5 F (36.9 C), temperature source Oral, resp. rate 20, height 5\' 7"  (1.702 m), weight 144 lb 8 oz (65.545 kg).  LABORATORY DATA: Lab Results  Component Value Date   WBC 6.2 03/18/2012   HGB 12.0 03/18/2012   HCT 34.5* 03/18/2012   MCV 84.6 03/18/2012   PLT PLATELETS APPEAR ADEQUATE 03/18/2012      Chemistry      Component Value  Date/Time   NA 135 03/18/2012 0649   NA 137 06/16/2011   K 4.1 03/18/2012 0649   CL 102 03/18/2012 0649   CO2 18* 03/18/2012 0649   BUN 11 03/18/2012 0649   BUN 9 06/16/2011   CREATININE 0.76 03/18/2012 0649   CREATININE 0.6 06/16/2011   GLU 102 06/16/2011      Component Value Date/Time   CALCIUM 9.5 03/18/2012 0649   ALKPHOS 69 03/18/2012 0649   AST 35 03/18/2012 0649   ALT 26 03/18/2012 0649   BILITOT  0.5 03/18/2012 0649     REASON: [id number]: Following a conversation with Dr. Dwain Sarna at the Breast Conference on 09/16/11, specimen # 2 actually represents additional superior and deep margins. Part 2 specimen site changed from Breast, excision, additional anterior and deep to Breast, excision, additional superior and deep margins. 09/16/11 09:41:52 AM (gt) FINAL DIAGNOSIS Diagnosis 1. Breast, lumpectomy, Left - INVASIVE GRADE I LOBULAR CARCINOMA, SPANNING 0.9 CM. - LOBULAR CARCINOMA IN SITU PRESENT. - LYMPH/VASCULAR INVASION NOT IDENTIFIED. - MEDIAL AND SUPERIOR MARGINS ARE FOCALLY POSITIVE. - SEE ONCOLOGY TEMPLATE AND COMMENT. 2. Breast, excision, Additional superior and deep margins - BENIGN BREAST PARENCHYMA SHOWING FIBROCYSTIC CHANGES. - NO ATYPIA, HYPERPLASIA OR MALIGNANCY IDENTIFIED. 3. Lymph node, sentinel, biopsy, Left axilla - ONE BENIGN LYMPH NODE WITH NO TUMOR SEEN (0/1). - SEE COMMENT. Microscopic Comment 1. BREAST, INVASIVE TUMOR, WITH LYMPH NODE SAMPLING Specimen, including laterality: Left breast lumpectomy with additional anterior and deep margin, resection and sentinel lymph node biopsy. Procedure: Left breast lumpectomy with additional anterior and deep margin, resection and sentinel lymph node biopsy. Grade: I Tubule formation: 3. Nuclear pleomorphism: 1. Mitotic:1. Tumor size (glass slide measurement): 0.9 cm. Margins: Invasive, distance to closest margin: Medial and superior margin is focally positive. Lymphovascular invasion: Not identified. 1 of 3 Amended  copy Corrected FINAL for Tapp, Enma C ([id number]) Microscopic Comment(continued) Ductal carcinoma in situ: No. Lobular neoplasia: Yes, lobular carcinoma in situ present. Tumor focality: Unifocal. Treatment effect: Not applicable. Extent of tumor: Tumor involves breast parenchyma and deep dermis. Lymph nodes: # examined: .1 Lymph nodes with metastasis: 0. Breast prognostic profile: Performed on [id number] Estrogen receptor: 97%, positive. Progesterone receptor: 35%, positive. Her 2 neu: 1.03, not amplified. Ki-67: 9%, low. Non-neoplastic breast: A small amount of usual ductal hyperplasia is present with calcifications. Benign stromal and vascular calcifications are also present. TNM: pT1b, pN0, MX Comments: A cytokeratin AE1/AE3 stain is performed on two blocks (1B and 1G) to determine the extent of the invasive lobular carcinoma. The stains confirm that the medial and superior margins are focally positive. Dr. Colonel Bald has seen this case in consultation with agreement of the positive margin status. A Her-2/neu by CISH will be repeated on the current tumor. RAH:gt, 09/07/11) 3. Immunohistochemical staining is performed on the sentinel lymph node for cytokeratin AE1/AE3. The stain is negative confirming that there is no metastatic carcinoma. (RH:gt, 09/07/11) Pecola Leisure MD   ASSESSMENT: 78 year old female with  #1 stage IA invasive lobular carcinoma of the left breast status post central lumpectomy the tumor was ER positive measuring 0.9 cm. PR +35% ER +97% HER-2/neu negative proliferation marker 9% and low.  #2 patient Had been on tamoxifen however on 01/07/2012 she discontinued it due to severe myalgias and arthralgias.  3 patient had been on radiation therapy she received her first dose on August 7. However her course was complicated by development of hypertensive crisis requiring hospitalization and then subsequent referral to cardiology. She has had cardiac catheterization  that did not show any Significant coronary artery disease.Patient is now very anxious about restarting radiation. We had an extensive discussion. I do think that she needs to receive local therapy.She would like to discuss her situation with Dr. Roselind Messier this well and I will send him a note.   PLAN:. #1 Patient will return to [facility] for radiation therapy. She will meet with Dr. Clovis Riley there. I will also have Dr. Roselind Messier called the patient as well. Patient is agreeable to starting the radiation again  #2 for  patients anxiety I have recommended lorazepam 0.5 mg half hour prior to her radiation. Complete instructions were given to the patient and her husband.  #3 because her blood pressures are elevated I have recommended that she go back to hydrochlorothiazide 12.5 mg on a daily basis. I will continue to monitor her sodiums closely.  #4 I will see her back in 1 month's time or sooner if need arises.  All questions were answered. The patient knows to call the clinic with any problems, questions or concerns. We can certainly see the patient much sooner if necessary.  I spent 25 minutes counseling the patient face to face. The total time spent in the appointment was 30 minutes.    Drue Second, MD Medical/Oncology [facility] [phone] (beeper) [phone] (Office)  03/22/2012, 12:22 PM

## 2012-03-22 NOTE — Patient Instructions (Addendum)
Proceed with radiation therapy at [facility] taking hydrochlorthiazide 12.5 mg daily for blood pressure  Lorazepam 0.5 mg 1/2 prior to radiation and 2 hours after radiation if needed  I will see you back in 1 month or sooner if needed

## 2012-03-25 ENCOUNTER — Telehealth (INDEPENDENT_AMBULATORY_CARE_PROVIDER_SITE_OTHER): Payer: Self-pay

## 2012-03-25 NOTE — Telephone Encounter (Signed)
Called pt to make a 3 month f/u appt per Dr Dwain Sarna. I scheduled the appt for 06/10/12. The pt is ok with the appt.

## 2012-03-31 ENCOUNTER — Telehealth: Payer: Self-pay | Admitting: Endocrinology

## 2012-03-31 NOTE — Telephone Encounter (Signed)
Forward 2 pages from [facility] to Dr. Romero Belling for review on 03-31-12 ym

## 2012-04-04 ENCOUNTER — Ambulatory Visit: Payer: Medicare Other | Admitting: Radiation Oncology

## 2012-04-18 ENCOUNTER — Ambulatory Visit: Payer: Medicare Other | Admitting: Radiation Oncology

## 2012-04-22 ENCOUNTER — Other Ambulatory Visit (HOSPITAL_BASED_OUTPATIENT_CLINIC_OR_DEPARTMENT_OTHER): Payer: Medicare Other | Admitting: Lab

## 2012-04-22 ENCOUNTER — Ambulatory Visit: Payer: Medicare Other | Admitting: Oncology

## 2012-04-22 ENCOUNTER — Telehealth: Payer: Self-pay | Admitting: *Deleted

## 2012-04-22 ENCOUNTER — Encounter: Payer: Self-pay | Admitting: Oncology

## 2012-04-22 VITALS — BP 174/79 | HR 79 | Temp 98.8°F | Resp 20 | Ht 67.0 in | Wt 147.2 lb

## 2012-04-22 DIAGNOSIS — Z170 Estrogen receptor positive status [ER+]: Secondary | ICD-10-CM

## 2012-04-22 DIAGNOSIS — C50419 Malignant neoplasm of upper-outer quadrant of unspecified female breast: Secondary | ICD-10-CM

## 2012-04-22 DIAGNOSIS — F411 Generalized anxiety disorder: Secondary | ICD-10-CM

## 2012-04-22 DIAGNOSIS — R030 Elevated blood-pressure reading, without diagnosis of hypertension: Secondary | ICD-10-CM

## 2012-04-22 LAB — CBC WITH DIFFERENTIAL/PLATELET
BASO%: 0.7 % (ref 0.0–2.0)
EOS%: 1.8 % (ref 0.0–7.0)
HCT: 37.3 % (ref 34.8–46.6)
LYMPH%: 29.4 % (ref 14.0–49.7)
MCH: 30.9 pg (ref 25.1–34.0)
MCHC: 34.5 g/dL (ref 31.5–36.0)
MONO#: 0.8 10*3/uL (ref 0.1–0.9)
NEUT%: 55.9 % (ref 38.4–76.8)
RBC: 4.16 10*6/uL (ref 3.70–5.45)
WBC: 6.8 10*3/uL (ref 3.9–10.3)
lymph#: 2 10*3/uL (ref 0.9–3.3)

## 2012-04-22 LAB — BASIC METABOLIC PANEL (CC13)
Calcium: 9.8 mg/dL (ref 8.4–10.4)
Chloride: 97 mEq/L — ABNORMAL LOW (ref 98–107)
Creatinine: 0.8 mg/dL (ref 0.6–1.1)
Sodium: 130 mEq/L — ABNORMAL LOW (ref 136–145)

## 2012-04-22 NOTE — Progress Notes (Signed)
OFFICE PROGRESS NOTE  CC Dr. Beather Arbour Dr. Antony Blackbird Dr. Emelia Loron Rene Paci, MD [address] [facility] [address] [place] [address]  DIAGNOSIS: 78 year old female Stage I invasive lobular carcinoma s/p central lumpectomy on 09/02/11  PRIOR THERAPY: 1. Status post lumpectomy on 09/02/2011 with the final pathology revealing an invasive grade 1 lobular carcinoma measuring 0.9 cm with lobular carcinoma in situ noted. Medial and superior margins were focally positive. Patient subsequently underwent additional excision of the superior and deep margins that showed benign breast parenchyma with fibrocystic changes no atypia hyperplasia or malignancy. One sentinel node was negative for metastatic disease.  2. Patient Was started on tamoxifen but she has not been able to tolerate it and [place] it was discontinued on 01/07/2012.  3. Patient began radiation therapy last week at [facility] center. She has only received 1 dose but then developed hypertension and was seen the ER at [facility] and subsequently transferred to [facility] for work up including a cardiac catherterization  #4 patient has decided against radiation therapy or any kind of other antiestrogen R. Adjuvant therapy. She is very happy with her decision.  CURRENT THERAPY: Observation  INTERVAL HISTORY: [name] 78 y.o. female returns for Follow up visit .overall she is doing well. She did meet with Dr. Clovis Riley again regarding radiation therapy resuming it. However after extensive discussion patient and her husband have opted not to get any radiation. She also has opted not to go on any antiestrogen therapy as well. She is very happy and at peace with her decision. MEDICAL HISTORY: Past Medical History  Diagnosis Date  . Hyponatremia   . Hypothyroidism   . Diabetes mellitus     type wears insulin pump  . GERD (gastroesophageal reflux disease)   . Cancer     left breast  .  Arthritis     fingers  . Hypothyroid   . Hypercholesteremia   . Hypertension   . Diabetes mellitus type 1     type I- wears insulin pump  . Osteoarthritis   . PAF (paroxysmal atrial fibrillation)     One episode in 2008, spontaneously converted in the ED, no further treatment  . Thyroid nodule dx 07/2011    [place] q 11mo to follow calcified L thyroid nodule (12/08/11 [place])  . Osteoporosis, post-menopausal     DEXA 12/02/11: -3.5 (with lomax gyn), declines bisphos due to bone pain  . Spondylosis     thoracic spine  . Breast cancer   . Cancer of upper-outer quadrant of female breast 08/21/2011    ER +  PR +  Her 2 -  Ki67 9%   0.9 cm invasive lobular  Carcinoma ,s/p central lumpectomy with sentinel node biopsy,  ER/PR positive s/p re-excion on 09/16/11 with final pathology showing atypical hyperplasia     ALLERGIES:  is allergic to augmentin; keflex; hydrocodone; and latex.  MEDICATIONS:  Current Outpatient Prescriptions  Medication Sig Dispense Refill  . ALPRAZolam (XANAX) 0.5 MG tablet Take 0.25 mg by mouth 3 (three) times daily as needed. For Anxiety      . aspirin 81 MG tablet Take 81 mg by mouth daily.      [name] BIOGAIA PROBIOTIC (BIOGAIA PROBIOTIC) LIQD 3oz before breakfast      . Calcium Citrate-Vitamin D (CITRACAL + D PO) Take 1,000 mg by mouth daily.      . cholecalciferol (VITAMIN D) 1000 UNITS tablet Take 1,000 Units  by mouth daily.       . fish oil-omega-3 fatty acids 1000 MG capsule Take 2 g by mouth daily.      [name] glucose blood (ONE TOUCH ULTRA TEST) test strip 8 tests/day, and lancets 250.03  240 each  3  . hydrochlorothiazide (HYDRODIURIL) 12.5 MG tablet Take 1 tablet (12.5 mg total) by mouth daily.  30 tablet  3  . insulin glulisine (APIDRA) 100 UNIT/ML injection Dose is variable. Use as indicated in insulin pump. 4 vials per month  40 mL  1  . Insulin Human (INSULIN PUMP) 100 unit/ml SOLN Inject 0.425-0.725 each into the skin every hour.       . IV Sets-Tubing (INFUSION SET 24"  ) MISC 1 Device by Does not apply route every 3 (three) days.      [name] levothyroxine (SYNTHROID, LEVOTHROID) 75 MCG tablet Take 75 mcg by mouth daily.      . Multiple Vitamin (MULTIVITAMIN) tablet Take 1 tablet by mouth daily. For breast and bone health      . simvastatin (ZOCOR) 20 MG tablet Take 20 mg by mouth at bedtime.       . vitamin C (ASCORBIC ACID) 500 MG tablet Take 1,000 mg by mouth daily.        SURGICAL HISTORY:  Past Surgical History  Procedure Date  . Cardiovascular stress test 03/17/2012  . Breast surgery     biopsy  . Breast lumpectomy   . Appendectomy   . Abdominal hysterectomy   . Bladder repair 5 yrs ago  . Tonsillectomy  age 47  . Appendectomy age 67  . Abdominal hysterectomy yrs ago  . Breast surgery  yrs ago    br bx  . Breast surgery 01/ 23/2013    left central lumpectomy and snbx, re-excision lumpectomy    REVIEW OF SYSTEMS:  Pertinent items are noted in HPI.   PHYSICAL EXAMINATION: Well-developed nourished female appearing much younger than her stated age HEENT exam EOMI PERRLA sclerae anicteric no conjunctival pallor oral mucosa is moist neck is supple no palpable adenopathy lungs are clear cardiovascular is regular rate rhythm no murmurs gallops or rubs abdomen is soft nontender nondistended bowel sounds are present no HSM extremities no edema neuro patient's alert oriented otherwise nonfocal Breast Exam: left breast with central lumpectomy healing well, no other masses ECOG PERFORMANCE STATUS: 0 - Asymptomatic  Blood pressure 174/79, pulse 79, temperature 98.8 F (37.1 C), temperature source Oral, resp. rate 20, height 5\' 7"  (1.702 m), weight 147 lb 3.2 oz (66.769 kg).  LABORATORY DATA: Lab Results  Component Value Date   WBC 6.8 04/22/2012   HGB 12.9 04/22/2012   HCT 37.3 04/22/2012   MCV 89.7 04/22/2012   PLT 178 04/22/2012      Chemistry      Component Value Date/Time   NA 135 03/18/2012 0649   NA 137 06/16/2011   K 4.1 03/18/2012 0649   CL  102 03/18/2012 0649   CO2 18* 03/18/2012 0649   BUN 11 03/18/2012 0649   BUN 9 06/16/2011   CREATININE 0.76 03/18/2012 0649   CREATININE 0.6 06/16/2011   GLU 102 06/16/2011      Component Value Date/Time   CALCIUM 9.5 03/18/2012 0649   ALKPHOS 69 03/18/2012 0649   AST 35 03/18/2012 0649   ALT 26 03/18/2012 0649   BILITOT 0.5 03/18/2012 0649     REASON: [id number]: Following a conversation with Dr. Dwain Sarna at the Breast Conference on 09/16/11, specimen #  2 actually represents additional superior and deep margins. Part 2 specimen site changed from Breast, excision, additional anterior and deep to Breast, excision, additional superior and deep margins. 09/16/11 09:41:52 AM (gt) FINAL DIAGNOSIS Diagnosis 1. Breast, lumpectomy, Left - INVASIVE GRADE I LOBULAR CARCINOMA, SPANNING 0.9 CM. - LOBULAR CARCINOMA IN SITU PRESENT. - LYMPH/VASCULAR INVASION NOT IDENTIFIED. - MEDIAL AND SUPERIOR MARGINS ARE FOCALLY POSITIVE. - SEE ONCOLOGY TEMPLATE AND COMMENT. 2. Breast, excision, Additional superior and deep margins - BENIGN BREAST PARENCHYMA SHOWING FIBROCYSTIC CHANGES. - NO ATYPIA, HYPERPLASIA OR MALIGNANCY IDENTIFIED. 3. Lymph node, sentinel, biopsy, Left axilla - ONE BENIGN LYMPH NODE WITH NO TUMOR SEEN (0/1). - SEE COMMENT. Microscopic Comment 1. BREAST, INVASIVE TUMOR, WITH LYMPH NODE SAMPLING Specimen, including laterality: Left breast lumpectomy with additional anterior and deep margin, resection and sentinel lymph node biopsy. Procedure: Left breast lumpectomy with additional anterior and deep margin, resection and sentinel lymph node biopsy. Grade: I Tubule formation: 3. Nuclear pleomorphism: 1. Mitotic:1. Tumor size (glass slide measurement): 0.9 cm. Margins: Invasive, distance to closest margin: Medial and superior margin is focally positive. Lymphovascular invasion: Not identified. 1 of 3 Amended copy Corrected FINAL for Spiers, Marianna C ([id number]) Microscopic  Comment(continued) Ductal carcinoma in situ: No. Lobular neoplasia: Yes, lobular carcinoma in situ present. Tumor focality: Unifocal. Treatment effect: Not applicable. Extent of tumor: Tumor involves breast parenchyma and deep dermis. Lymph nodes: # examined: .1 Lymph nodes with metastasis: 0. Breast prognostic profile: Performed on [id number] Estrogen receptor: 97%, positive. Progesterone receptor: 35%, positive. Her 2 neu: 1.03, not amplified. Ki-67: 9%, low. Non-neoplastic breast: A small amount of usual ductal hyperplasia is present with calcifications. Benign stromal and vascular calcifications are also present. TNM: pT1b, pN0, MX Comments: A cytokeratin AE1/AE3 stain is performed on two blocks (1B and 1G) to determine the extent of the invasive lobular carcinoma. The stains confirm that the medial and superior margins are focally positive. Dr. Colonel Bald has seen this case in consultation with agreement of the positive margin status. A Her-2/neu by CISH will be repeated on the current tumor. RAH:gt, 09/07/11) 3. Immunohistochemical staining is performed on the sentinel lymph node for cytokeratin AE1/AE3. The stain is negative confirming that there is no metastatic carcinoma. (RH:gt, 09/07/11) Pecola Leisure MD   ASSESSMENT: 78 year old female with  #1 stage IA invasive lobular carcinoma of the left breast status post central lumpectomy the tumor was ER positive measuring 0.9 cm. PR +35% ER +97% HER-2/neu negative proliferation marker 9% and low.she has no clinical evidence of recurrent disease  #2 patient Had been on tamoxifen however on 01/07/2012 she discontinued it due to severe myalgias and arthralgias.  #3 patient has declined radiation therapy.  PLAN:. #1 we will continue to follow the patient every 4-6 months. Her next visit with me will be in February 2013.  All questions were answered. The patient knows to call the clinic with any problems, questions or concerns. We can  certainly see the patient much sooner if necessary.  I spent 15 minutes counseling the patient face to face. The total time spent in the appointment was 30 minutes.    Drue Second, MD Medical/Oncology [facility] [phone] (beeper) [phone] (Office)  04/22/2012, 3:27 PM

## 2012-04-22 NOTE — Patient Instructions (Addendum)
Doing well.  I will continue to see you every 3  4 months. Next visit with 09/23/11

## 2012-04-22 NOTE — Telephone Encounter (Signed)
Gave patient appointment for [id number] starting at 2:00pm

## 2012-05-05 ENCOUNTER — Encounter: Payer: Self-pay | Admitting: Internal Medicine

## 2012-05-05 ENCOUNTER — Ambulatory Visit (INDEPENDENT_AMBULATORY_CARE_PROVIDER_SITE_OTHER): Payer: Medicare Other | Admitting: Internal Medicine

## 2012-05-05 VITALS — BP 142/62 | HR 78 | Resp 18 | Ht 66.0 in | Wt 146.0 lb

## 2012-05-05 DIAGNOSIS — I10 Essential (primary) hypertension: Secondary | ICD-10-CM

## 2012-05-05 DIAGNOSIS — I200 Unstable angina: Secondary | ICD-10-CM

## 2012-05-05 DIAGNOSIS — R55 Syncope and collapse: Secondary | ICD-10-CM

## 2012-05-05 NOTE — Progress Notes (Signed)
HPI [name] returns today for followup. She is a very pleasant 78 year old woman with a history of unexplained syncope in the setting of a non-ST elevation MI. This had occurred all after undergoing radiation therapy for breast cancer. She underwent catheterization which demonstrated no obstructive coronary disease. Since then she has done well. She remains active and has no chest pain and has had no recurrent syncopal episodes. No peripheral edema. No shortness of breath. She exercises almost every day. Allergies  Allergen Reactions  . Augmentin (Amoxicillin-Pot Clavulanate) Other (See Comments)    Gi upset only no rash or hives   . Keflex (Cephalexin) Hives and Nausea Only    Stomach upset  . Hydrocodone Rash    Rash to chest , side back  . Latex Rash    Powder in gloves causes rash; "not allergic to latex just the powder"     Current Outpatient Prescriptions  Medication Sig Dispense Refill  . ALPRAZolam (XANAX) 0.5 MG tablet Take 0.25 mg by mouth 3 (three) times daily as needed. For Anxiety      . aspirin 81 MG tablet Take 81 mg by mouth daily.      [name] BIOGAIA PROBIOTIC (BIOGAIA PROBIOTIC) LIQD 3oz before breakfast      . Calcium Citrate-Vitamin D (CITRACAL + D PO) Take 1,000 mg by mouth daily.      . cholecalciferol (VITAMIN D) 1000 UNITS tablet Take 1,000 Units by mouth daily.       . fish oil-omega-3 fatty acids 1000 MG capsule Take 2 g by mouth daily.      [name] glucose blood (ONE TOUCH ULTRA TEST) test strip 8 tests/day, and lancets 250.03  240 each  3  . hydrochlorothiazide (HYDRODIURIL) 12.5 MG tablet Take 1 tablet (12.5 mg total) by mouth daily.  30 tablet  3  . insulin glulisine (APIDRA) 100 UNIT/ML injection Dose is variable. Use as indicated in insulin pump. 4 vials per month  40 mL  1  . Insulin Human (INSULIN PUMP) 100 unit/ml SOLN Inject 0.425-0.725 each into the skin every hour.       . IV Sets-Tubing (INFUSION SET 24" ) MISC 1 Device by Does not apply route every 3  (three) days.      [name] levothyroxine (SYNTHROID, LEVOTHROID) 75 MCG tablet Take 75 mcg by mouth daily.      . Multiple Vitamin (MULTIVITAMIN) tablet Take 1 tablet by mouth daily. For breast and bone health      . simvastatin (ZOCOR) 20 MG tablet Take 20 mg by mouth at bedtime.       . vitamin C (ASCORBIC ACID) 500 MG tablet Take 1,000 mg by mouth daily.         Past Medical History  Diagnosis Date  . Hyponatremia   . Hypothyroidism   . Diabetes mellitus     type wears insulin pump  . GERD (gastroesophageal reflux disease)   . Cancer     left breast  . Arthritis     fingers  . Hypothyroid   . Hypercholesteremia   . Hypertension   . Diabetes mellitus type 1     type I- wears insulin pump  . Osteoarthritis   . PAF (paroxysmal atrial fibrillation)     One episode in 2008, spontaneously converted in the ED, no further treatment  . Thyroid nodule dx 07/2011    [place] q 6mo to follow calcified L thyroid nodule (12/08/11 [place])  . Osteoporosis, post-menopausal     DEXA 12/02/11: -  3.5 (with lomax gyn), declines bisphos due to bone pain  . Spondylosis     thoracic spine  . Breast cancer   . Cancer of upper-outer quadrant of female breast 08/21/2011    ER +  PR +  Her 2 -  Ki67 9%   0.9 cm invasive lobular  Carcinoma ,s/p central lumpectomy with sentinel node biopsy,  ER/PR positive s/p re-excion on 09/16/11 with final pathology showing atypical hyperplasia     ROS:   All systems reviewed and negative except as noted in the HPI.   Past Surgical History  Procedure Date  . Cardiovascular stress test 03/17/2012  . Breast surgery     biopsy  . Breast lumpectomy   . Appendectomy   . Abdominal hysterectomy   . Bladder repair 5 yrs ago  . Tonsillectomy  age 29  . Appendectomy age 50  . Abdominal hysterectomy yrs ago  . Breast surgery  yrs ago    br bx  . Breast surgery 01/ 23/2013    left central lumpectomy and snbx, re-excision lumpectomy     Family History  Problem Relation Age of  Onset  . Cancer Sister     unknown  . Hypertension Mother   . Stroke Mother   . Arthritis Father   . Heart disease Father   . Diabetes Son      History   Social History  . Marital Status: Married    Spouse Name: N/A    Number of Children: N/A  . Years of Education: N/A   Occupational History  . Not on file.   Social History Main Topics  . Smoking status: Former Smoker -- 1.0 packs/day for 10 years    Types: Cigarettes    Quit date: 08/27/1977  . Smokeless tobacco: Never Used  . Alcohol Use: Yes     rare  . Drug Use: No  . Sexually Active: Yes    Birth Control/ Protection: Post-menopausal   Other Topics Concern  . Not on file   Social History Narrative   ** Merged History Encounter **      BP 142/62  Pulse 78  Resp 18  Ht 5\' 6"  (1.676 m)  Wt 146 lb (66.225 kg)  BMI 23.56 kg/m2  Physical Exam:  Well appearing elderly woman, who looks younger than her stated age, and in NAD HEENT: Unremarkable Neck:  No JVD, no thyromegally Lungs:  Clear with no wheezes, rales, or rhonchi. HEART:  Regular rate rhythm, no murmurs, no rubs, no clicks Abd:  soft, positive bowel sounds, no organomegally, no rebound, no guarding Ext:  2 plus pulses, no edema, no cyanosis, no clubbing Skin:  No rashes no nodules Neuro:  CN II through XII intact, motor grossly intact  Assess/Plan:

## 2012-05-05 NOTE — Assessment & Plan Note (Signed)
No recurrent symptoms. The etiology of her syncope is unclear though I suspect it is related to her prior radiation. At this point, I have allowed her to restart driving.

## 2012-05-05 NOTE — Assessment & Plan Note (Signed)
I've encouraged the patient to maintain a low-sodium diet and continue her current medical therapy. Her blood pressure has been fairly well-controlled.

## 2012-05-05 NOTE — Assessment & Plan Note (Signed)
She has had no recurrent anginal symptoms. She will continue her current medical therapy.

## 2012-05-10 ENCOUNTER — Ambulatory Visit: Payer: Medicare Other | Admitting: Endocrinology

## 2012-05-11 ENCOUNTER — Encounter: Payer: Self-pay | Admitting: Internal Medicine

## 2012-05-11 ENCOUNTER — Other Ambulatory Visit (INDEPENDENT_AMBULATORY_CARE_PROVIDER_SITE_OTHER): Payer: Medicare Other

## 2012-05-11 ENCOUNTER — Ambulatory Visit (INDEPENDENT_AMBULATORY_CARE_PROVIDER_SITE_OTHER): Payer: Medicare Other | Admitting: Internal Medicine

## 2012-05-11 VITALS — BP 136/72 | HR 77 | Temp 98.6°F | Ht 67.0 in | Wt 146.0 lb

## 2012-05-11 DIAGNOSIS — E039 Hypothyroidism, unspecified: Secondary | ICD-10-CM

## 2012-05-11 DIAGNOSIS — E109 Type 1 diabetes mellitus without complications: Secondary | ICD-10-CM

## 2012-05-11 DIAGNOSIS — E041 Nontoxic single thyroid nodule: Secondary | ICD-10-CM

## 2012-05-11 LAB — TSH: TSH: 2.4 u[IU]/mL (ref 0.35–5.50)

## 2012-05-11 LAB — CREATININE, SERUM: Creatinine, Ser: 0.6 mg/dL (ref 0.4–1.2)

## 2012-05-11 NOTE — Progress Notes (Signed)
Subjective:    Patient ID: [name], female    DOB: 04/30/1929, 78 y.o.   MRN: [id number]  HPI  Here for follow up -reviewed chronic medical issues:  DM1 - on insulin pump since 2007 - has followed with endo at [facility] but changed to local provider - denies complications with neuropathy, follows also optho - denies hypoglycemia  Beast cancer - dx 08/2011 s/p resection - intol of tamoxifen,  Poor tol XRT 8/203 with syncope event (no cardiac abnormality identified) - working with med onc on same  Dyslipidemia -on simva since 2003 - the patient reports compliance with medication(s) as prescribed. Denies adverse side effects.  Hypothyroid - the patient reports compliance with medication(s) as prescribed. Denies adverse side effects.  Past Medical History  Diagnosis Date  . Hyponatremia   . Hypothyroidism   . Diabetes mellitus     type wears insulin pump  . GERD (gastroesophageal reflux disease)   . Cancer     left breast  . Arthritis     fingers  . Hypothyroid   . Hypercholesteremia   . Hypertension   . Diabetes mellitus type 1     type I- wears insulin pump  . Osteoarthritis   . PAF (paroxysmal atrial fibrillation)     One episode in 2008, spontaneously converted in the ED, no further treatment  . Thyroid nodule dx 07/2011    [place] q 48mo to follow calcified L thyroid nodule (12/08/11 [place])  . Osteoporosis, post-menopausal     DEXA 12/02/11: -3.5 (with lomax gyn), declines bisphos due to bone pain  . Spondylosis     thoracic spine  . Breast cancer   . Cancer of upper-outer quadrant of female breast 08/21/2011    ER +  PR +  Her 2 -  Ki67 9%   0.9 cm invasive lobular  Carcinoma ,s/p central lumpectomy with sentinel node biopsy,  ER/PR positive s/p re-excion on 09/16/11 with final pathology showing atypical hyperplasia    Review of Systems  Constitutional: Negative for fever or weight change.  Respiratory: Negative for cough and shortness of breath.   Cardiovascular: Negative for  chest pain or palpitations.      Objective:   Physical Exam  BP 136/72  Pulse 77  Temp 98.6 F (37 C) (Oral)  Ht 5\' 7"  (1.702 m)  Wt 146 lb (66.225 kg)  BMI 22.87 kg/m2  SpO2 94% Wt Readings from Last 3 Encounters:  05/11/12 146 lb (66.225 kg)  05/05/12 146 lb (66.225 kg)  04/22/12 147 lb 3.2 oz (66.769 kg)   Constitutional: She appears well-developed and well-nourished. No distress.  Neck: Normal range of motion. Neck supple. No JVD present. No thyromegaly present.  Cardiovascular: Normal rate, regular rhythm and normal heart sounds.  No murmur heard. No BLE edema. Pulmonary/Chest: Effort normal and breath sounds normal. No respiratory distress. She has no wheezes.  Skin: Skin is warm and dry. No rash noted. No erythema.  Psychiatric: She has a normal mood and affect. Her behavior is normal. Judgment and thought content normal.   Lab Results  Component Value Date   WBC 6.8 04/22/2012   HGB 12.9 04/22/2012   HCT 37.3 04/22/2012   PLT 178 04/22/2012   GLUCOSE 146* 04/22/2012   CHOL 179 06/16/2011   TRIG 54 06/16/2011   HDL 64 06/16/2011   LDLCALC 104 06/16/2011   ALT 26 03/18/2012   AST 35 03/18/2012   NA 130* 04/22/2012   K 3.9 04/22/2012  CL 97* 04/22/2012   CREATININE 0.8 04/22/2012   BUN 13.0 04/22/2012   CO2 25 04/22/2012   TSH 2.42 02/09/2012   INR 1.16 03/18/2012   HGBA1C 7.6* 02/09/2012      Assessment & Plan:  See problem list. Medications and labs reviewed today.

## 2012-05-11 NOTE — Assessment & Plan Note (Signed)
[facility] ultrasound report 4/30/3 reviewed - copied into EMR Recheck q68mo and follow up with endo as planned

## 2012-05-11 NOTE — Assessment & Plan Note (Signed)
On insulin pump since 2007 - requests local endo to help manage same (difficult drive to [facility] at her age) Reviewed settings, works with "pump nurse" q71mo for review Denies complications - sees optho annually On ASA, statin check a1c and follow with endo so long as remains on pump  Lab Results  Component Value Date   HGBA1C 7.6* 02/09/2012

## 2012-05-11 NOTE — Assessment & Plan Note (Signed)
On medication replacment for years, varying doses reviewed Check TSH and adjust as needed Lab Results  Component Value Date   TSH 2.42 02/09/2012

## 2012-05-11 NOTE — Patient Instructions (Signed)
It was good to see you today. We have reviewed your interval medical events records including labs and tests today Test(s) ordered today. Your results will be called to you or release to MyChart after review (48-72hours after test completion). If any changes need to be made, you will be notified at that time. Medications reviewed, no changes at this time. Please schedule followup in 3-4 months, call sooner if problems.  Will refer for another thyroid ultrasound end of October/early November

## 2012-05-17 ENCOUNTER — Ambulatory Visit (INDEPENDENT_AMBULATORY_CARE_PROVIDER_SITE_OTHER): Payer: Medicare Other | Admitting: Endocrinology

## 2012-05-17 ENCOUNTER — Encounter: Payer: Self-pay | Admitting: Endocrinology

## 2012-05-17 VITALS — BP 126/78 | HR 78 | Temp 98.8°F | Wt 148.0 lb

## 2012-05-17 DIAGNOSIS — E109 Type 1 diabetes mellitus without complications: Secondary | ICD-10-CM

## 2012-05-17 NOTE — Patient Instructions (Addendum)
check your blood sugar 8 times a day.  vary the time of day when you check, between before the 3 meals, and at bedtime.  also check if you have symptoms of your blood sugar being too high or too low.  please keep a record of the readings and bring it to your next appointment here.  please call us sooner if your blood sugar goes below 70, or if it stays over 200.   Take a basal rate to 0.6 units/hr, 24 hrs per day.  continue the same mealtime bolus.   continue the same correction bolus (which some people call "sensitivity," or "insulin sensitivity ratio," or just "isr") of 1 unit for each by which your glucose exceeds 100.  Please bring your pump settings when you return.     Please come back for a follow-up appointment in 3 months.

## 2012-05-17 NOTE — Progress Notes (Signed)
Subjective:    Patient ID: [name], female    DOB: 07-Apr-1929, 78 y.o.   MRN: [id number]  HPI pt returns for f/u off type 1 DM (dx'ed 2007; no chronic complications; she has been on insulin since dx, and pump rx since soon thereafter; she has a onetouch ping insulin pump; she declines continuous glucose monitor).   She reports hypoglycemia approx every 2 weeks, usually before breakfast.  She takes approximately 45 units of apidra per day, via her pump.  she brings a record of her cbg's which i have reviewed today.  However, i can't interpret, as the time of day is not stated.  She still doesn't know her pump settings, except basal rates.   Past Medical History  Diagnosis Date  . Hyponatremia   . Hypothyroidism   . Diabetes mellitus     type wears insulin pump  . GERD (gastroesophageal reflux disease)   . Cancer     left breast  . Arthritis     fingers  . Hypothyroid   . Hypercholesteremia   . Hypertension   . Diabetes mellitus type 1     type I- wears insulin pump  . Osteoarthritis   . PAF (paroxysmal atrial fibrillation)     One episode in 2008, spontaneously converted in the ED, no further treatment  . Thyroid nodule dx 07/2011    [place] q 40mo to follow calcified L thyroid nodule (12/08/11 [place])  . Osteoporosis, post-menopausal     DEXA 12/02/11: -3.5 (with lomax gyn), declines bisphos due to bone pain  . Spondylosis     thoracic spine  . Breast cancer   . Cancer of upper-outer quadrant of female breast 08/21/2011    ER +  PR +  Her 2 -  Ki67 9%   0.9 cm invasive lobular  Carcinoma ,s/p central lumpectomy with sentinel node biopsy,  ER/PR positive s/p re-excion on 09/16/11 with final pathology showing atypical hyperplasia     Past Surgical History  Procedure Date  . Cardiovascular stress test 03/17/2012  . Breast surgery     biopsy  . Breast lumpectomy   . Appendectomy   . Abdominal hysterectomy   . Bladder repair 5 yrs ago  . Tonsillectomy  age 22  . Appendectomy age 69    . Abdominal hysterectomy yrs ago  . Breast surgery  yrs ago    br bx  . Breast surgery 01/ 23/2013    left central lumpectomy and snbx, re-excision lumpectomy    History   Social History  . Marital Status: Married    Spouse Name: N/A    Number of Children: N/A  . Years of Education: N/A   Occupational History  . Not on file.   Social History Main Topics  . Smoking status: Former Smoker -- 1.0 packs/day for 10 years    Types: Cigarettes    Quit date: 08/27/1977  . Smokeless tobacco: Never Used  . Alcohol Use: Yes     rare  . Drug Use: No  . Sexually Active: Yes    Birth Control/ Protection: Post-menopausal   Other Topics Concern  . Not on file   Social History Narrative   ** Merged History Encounter **     Current Outpatient Prescriptions on File Prior to Visit  Medication Sig Dispense Refill  . ALPRAZolam (XANAX) 0.5 MG tablet Take 0.25 mg by mouth 3 (three) times daily as needed. For Anxiety      . aspirin 81 MG tablet  Take 81 mg by mouth daily.      [name] BIOGAIA PROBIOTIC (BIOGAIA PROBIOTIC) LIQD 3oz before breakfast      . Calcium Citrate-Vitamin D (CITRACAL + D PO) Take 1,000 mg by mouth daily.      . cholecalciferol (VITAMIN D) 1000 UNITS tablet Take 1,000 Units by mouth daily.       . fish oil-omega-3 fatty acids 1000 MG capsule Take 2 g by mouth daily.      [name] glucose blood (ONE TOUCH ULTRA TEST) test strip 8 tests/day, and lancets 250.03  240 each  3  . hydrochlorothiazide (HYDRODIURIL) 12.5 MG tablet Take 1 tablet (12.5 mg total) by mouth daily.  30 tablet  3  . insulin glulisine (APIDRA) 100 UNIT/ML injection Use as indicated in insulin pump, total of 60 units per day. 4 vials per month      . levothyroxine (SYNTHROID, LEVOTHROID) 75 MCG tablet Take 75 mcg by mouth daily.      . Multiple Vitamin (MULTIVITAMIN) tablet Take 1 tablet by mouth daily. For breast and bone health      . simvastatin (ZOCOR) 20 MG tablet Take 20 mg by mouth at bedtime.       . vitamin  C (ASCORBIC ACID) 500 MG tablet Take 1,000 mg by mouth daily.        Allergies  Allergen Reactions  . Augmentin (Amoxicillin-Pot Clavulanate) Other (See Comments)    Gi upset only no rash or hives   . Keflex (Cephalexin) Hives and Nausea Only    Stomach upset  . Hydrocodone Rash    Rash to chest , side back  . Latex Rash    Powder in gloves causes rash; "not allergic to latex just the powder"    Family History  Problem Relation Age of Onset  . Cancer Sister     unknown  . Hypertension Mother   . Stroke Mother   . Arthritis Father   . Heart disease Father   . Diabetes Son     BP 126/78  Pulse 78  Temp 98.8 F (37.1 C) (Oral)  Wt 148 lb (67.132 kg)    Review of Systems Denies LOC    Objective:   Physical Exam VITAL SIGNS:  See vs page GENERAL: no distress SKIN:  Insulin injection sites at the anterior abdomen are normal.     Lab Results  Component Value Date   HGBA1C 7.2* 05/11/2012      Assessment & Plan:  DM; Based on the pattern of her cbg's, she needs some adjustment in her therapy.

## 2012-05-25 ENCOUNTER — Other Ambulatory Visit: Payer: Self-pay | Admitting: General Practice

## 2012-05-25 NOTE — Telephone Encounter (Signed)
Disregard, need to fill out fax for refill.

## 2012-05-26 ENCOUNTER — Ambulatory Visit
Admission: RE | Admit: 2012-05-26 | Discharge: 2012-05-26 | Disposition: A | Discharge status: Home / Self Care | Payer: Medicare Other | Source: Ambulatory Visit | Attending: Internal Medicine | Admitting: Internal Medicine

## 2012-05-26 DIAGNOSIS — E041 Nontoxic single thyroid nodule: Secondary | ICD-10-CM

## 2012-06-10 ENCOUNTER — Ambulatory Visit (INDEPENDENT_AMBULATORY_CARE_PROVIDER_SITE_OTHER): Payer: Medicare Other | Admitting: General Surgery

## 2012-06-10 ENCOUNTER — Encounter (INDEPENDENT_AMBULATORY_CARE_PROVIDER_SITE_OTHER): Payer: Self-pay | Admitting: General Surgery

## 2012-06-10 VITALS — BP 152/60 | HR 76 | Temp 97.3°F | Resp 16 | Ht 67.0 in | Wt 146.6 lb

## 2012-06-10 DIAGNOSIS — Z853 Personal history of malignant neoplasm of breast: Secondary | ICD-10-CM

## 2012-06-10 NOTE — Progress Notes (Signed)
Subjective:     Patient ID: [name], female   DOB: July 14, 1929, 78 y.o.   MRN: [id number]  HPI 24 yof who underwent a central lumpectomy on the left side for a stage I invasive lobular carcinoma. She also underwent a sentinel lymph node biopsy that was obviously negative. She has been unable to tolerate any adjuvant therapy. She returns today with some complaints of soreness in her left breast. She is otherwise doing well now.  Review of Systems     Objective:   Physical Exam  Vitals reviewed. Constitutional: She appears well-developed and well-nourished.  Pulmonary/Chest: Right breast exhibits no inverted nipple, no mass, no nipple discharge, no skin change and no tenderness. Left breast exhibits no mass, no skin change and no tenderness. Breasts are symmetrical.    Lymphadenopathy:    She has no cervical adenopathy.    She has no axillary adenopathy.       Right: No supraclavicular adenopathy present.       Left: No supraclavicular adenopathy present.       Assessment:     History of left breast cancer, stage I treated with surgery    Plan:     She has no clinical evidence of a recurrence right now. She has not been able to do any adjuvant therapy. She is comfortable with this decision we're going to plan on just following her closely. She is due for her mammogram and I recommended she get tomosynthesis in January when she is due. She also asked about a screening MRI and I will plan on discussing this with medical oncology in radiology. I will see her back in one year unless she needs something sooner.

## 2012-06-10 NOTE — Patient Instructions (Signed)

## 2012-06-13 ENCOUNTER — Telehealth (INDEPENDENT_AMBULATORY_CARE_PROVIDER_SITE_OTHER): Payer: Self-pay | Admitting: General Surgery

## 2012-06-13 NOTE — Telephone Encounter (Signed)
LMOm asking pt to return my call. °

## 2012-06-13 NOTE — Telephone Encounter (Signed)
Message copied by Littie Deeds on Mon Jun 13, 2012 10:12 AM ------      Message from: [place], [place]      Created: Mon Jun 13, 2012  9:02 AM       Would you call ms Fonseca and tell her dr Welton Flakes will order an mr when she sees her to follow her      ----- Message -----         From: Victorino December, MD         Sent: 06/10/2012   4:58 PM           To: Emelia Loron, MD            Sure I will order it when I see her back      ----- Message -----         From: Emelia Loron, MD         Sent: 06/10/2012  10:18 AM           To: Victorino December, MD            [name]      I saw her today and saw the notes that she cannot really tolerate any adjuvant therapy.  She asked about screening mr now.  What do you think?      Matt

## 2012-06-14 NOTE — Telephone Encounter (Signed)
Patient called back and I made her aware that Dr Welton Flakes will order her MR at her follow up visit with her. She will call back with any questions for [place].

## 2012-06-17 ENCOUNTER — Ambulatory Visit: Payer: Medicare Other | Admitting: Oncology

## 2012-06-17 ENCOUNTER — Other Ambulatory Visit: Payer: Medicare Other | Admitting: Lab

## 2012-07-11 DIAGNOSIS — E039 Hypothyroidism, unspecified: Secondary | ICD-10-CM | POA: Insufficient documentation

## 2012-07-11 LAB — HM COLONOSCOPY

## 2012-07-13 LAB — HEMOGLOBIN A1C: Hgb A1c MFr Bld: 7 % — AB (ref 4.0–6.0)

## 2012-07-13 LAB — TSH: TSH: 1.88 u[IU]/mL (ref <=5.90)

## 2012-07-22 ENCOUNTER — Telehealth: Payer: Self-pay | Admitting: Medical Oncology

## 2012-07-22 NOTE — Telephone Encounter (Signed)
Let patient know that Dr. Dwain Sarna and I will discuss her request and let her know as soon as possible

## 2012-07-22 NOTE — Telephone Encounter (Signed)
Per MD, called patient to let her know that after  Dr Welton Flakes and Dr Dwain Sarna have discussed her request she will be notified as soon as possible after. Patient expressed understanding and hopes it will be soon.

## 2012-07-22 NOTE — Telephone Encounter (Signed)
Patient call; stated she saw Dr. Dwain Sarna  (Surgeon) in November, also that she has received notice that it is time for her mammogram "my breasst hurt right now and I don't wish to have them pressed on, and that's not how my cancer was found anyway, I would rather have an MRI. and according to Dr. Dwain Sarna, Dr. Welton Flakes is supposed to order that MRI. I was hoping I could have that done around the first of the year." Will review call with MD.

## 2012-07-29 ENCOUNTER — Encounter: Payer: Self-pay | Admitting: Internal Medicine

## 2012-07-29 ENCOUNTER — Ambulatory Visit (INDEPENDENT_AMBULATORY_CARE_PROVIDER_SITE_OTHER)
Admission: RE | Admit: 2012-07-29 | Discharge: 2012-07-29 | Disposition: A | Discharge status: Home / Self Care | Payer: Medicare Other | Source: Ambulatory Visit | Attending: Internal Medicine | Admitting: Internal Medicine

## 2012-07-29 ENCOUNTER — Ambulatory Visit (INDEPENDENT_AMBULATORY_CARE_PROVIDER_SITE_OTHER): Payer: Medicare Other | Admitting: Internal Medicine

## 2012-07-29 ENCOUNTER — Telehealth: Payer: Self-pay | Admitting: Internal Medicine

## 2012-07-29 VITALS — BP 160/76 | HR 80 | Temp 100.0°F | Resp 16 | Wt 142.0 lb

## 2012-07-29 DIAGNOSIS — J069 Acute upper respiratory infection, unspecified: Secondary | ICD-10-CM | POA: Insufficient documentation

## 2012-07-29 MED ORDER — PROMETHAZINE-CODEINE 6.25-10 MG/5ML PO SYRP: 5.0000 mL | ORAL_SOLUTION | ORAL | Status: DC | PRN

## 2012-07-29 MED ORDER — ACLIDINIUM BROMIDE 400 MCG/ACT IN AEPB: 1.0000 | INHALATION_SPRAY | Freq: Two times a day (BID) | RESPIRATORY_TRACT | Status: DC

## 2012-07-29 MED ORDER — AZITHROMYCIN 250 MG PO TABS: ORAL_TABLET | ORAL | Status: DC

## 2012-07-29 NOTE — Assessment & Plan Note (Signed)
Steroid intolerant Zpac  Prom-cod

## 2012-07-29 NOTE — Progress Notes (Signed)
  Subjective:    Patient ID: [name], female    DOB: 04/29/1929, 78 y.o.   MRN: [id number]  HPI  C/o URI sx's x 2 d: cough, CP, SOB  Review of Systems  Constitutional: Positive for chills, diaphoresis and fatigue.  HENT: Positive for congestion, rhinorrhea and sinus pressure.   Eyes: Negative for redness.  Respiratory: Positive for cough, chest tightness and wheezing. Negative for shortness of breath and stridor.   Musculoskeletal: Positive for arthralgias.  Neurological: Negative for dizziness.       Objective:   Physical Exam  Constitutional: She appears well-developed. No distress.       Tired   HENT:  Head: Normocephalic.  Right Ear: External ear normal.  Left Ear: External ear normal.  Nose: Nose normal.  Mouth/Throat: Oropharynx is clear and moist.       eryth throat  Eyes: Conjunctivae normal are normal. Pupils are equal, round, and reactive to light. Right eye exhibits no discharge. Left eye exhibits no discharge.  Neck: Normal range of motion. Neck supple. No JVD present. No tracheal deviation present. No thyromegaly present.  Cardiovascular: Normal rate, regular rhythm and normal heart sounds.   Pulmonary/Chest: No stridor. No respiratory distress. She has wheezes (R base).  Abdominal: Soft. Bowel sounds are normal. She exhibits no distension and no mass. There is no tenderness. There is no rebound and no guarding.  Musculoskeletal: She exhibits no edema and no tenderness.  Lymphadenopathy:    She has no cervical adenopathy.  Neurological: She displays normal reflexes. No cranial nerve deficit. She exhibits normal muscle tone. Coordination normal.  Skin: No rash noted. No erythema.  Psychiatric: She has a normal mood and affect. Her behavior is normal. Judgment and thought content normal.          Assessment & Plan:

## 2012-07-29 NOTE — Telephone Encounter (Signed)
[name], please, inform patient that she has bronchitis. No change in plans  Thx

## 2012-07-29 NOTE — Patient Instructions (Addendum)
Use over-the-counter  "cold" medicines  such as  "Afrin" nasal spray for nasal congestion as directed instead. Use" Delsym" or" Robitussin" cough syrup varietis for cough.  You can use plain "Tylenol" or "Advil" for fever, chills and achyness.  Please, make an appointment if you are not better or if you're worse.  

## 2012-07-31 ENCOUNTER — Other Ambulatory Visit: Payer: Self-pay | Admitting: Endocrinology

## 2012-08-01 ENCOUNTER — Encounter: Payer: Self-pay | Admitting: Internal Medicine

## 2012-08-01 NOTE — Telephone Encounter (Signed)
Pt informed

## 2012-08-15 ENCOUNTER — Ambulatory Visit: Payer: Medicare Other | Admitting: Oncology

## 2012-08-15 ENCOUNTER — Other Ambulatory Visit: Payer: Medicare Other | Admitting: Lab

## 2012-08-17 ENCOUNTER — Encounter: Payer: Self-pay | Admitting: Internal Medicine

## 2012-08-17 ENCOUNTER — Ambulatory Visit (INDEPENDENT_AMBULATORY_CARE_PROVIDER_SITE_OTHER): Payer: Medicare HMO | Admitting: Internal Medicine

## 2012-08-17 VITALS — BP 142/82 | HR 78 | Temp 98.6°F | Wt 145.0 lb

## 2012-08-17 DIAGNOSIS — E039 Hypothyroidism, unspecified: Secondary | ICD-10-CM

## 2012-08-17 DIAGNOSIS — I10 Essential (primary) hypertension: Secondary | ICD-10-CM

## 2012-08-17 DIAGNOSIS — C50419 Malignant neoplasm of upper-outer quadrant of unspecified female breast: Secondary | ICD-10-CM

## 2012-08-17 DIAGNOSIS — E109 Type 1 diabetes mellitus without complications: Secondary | ICD-10-CM

## 2012-08-17 NOTE — Patient Instructions (Signed)
It was good to see you today. We have reviewed your interval medical events records including labs and tests today Medications reviewed, no changes at this time. Please schedule followup in 6 months, call sooner if problems.  Will help schedule visit with Dr Welton Flakes before 09/2012 if possible

## 2012-08-17 NOTE — Assessment & Plan Note (Signed)
On insulin pump since 2007 - follows with endo to manage same (but notes difficult drive to [facility] at her age) Reviewed settings, works with "pump nurse" q60mo for review Denies complications - sees optho annually On ASA, statin  Lab Results  Component Value Date   HGBA1C 7.2* 05/11/2012

## 2012-08-17 NOTE — Assessment & Plan Note (Signed)
On medication replacment for years, varying doses reviewed Check TSH and adjust as needed Lab Results  Component Value Date   TSH 2.40 05/11/2012

## 2012-08-17 NOTE — Assessment & Plan Note (Signed)
BP Readings from Last 3 Encounters:  08/17/12 142/82  07/29/12 160/76  06/10/12 152/60   The current medical regimen is effective;  continue present plan and medications.

## 2012-08-17 NOTE — Progress Notes (Signed)
Subjective:    Patient ID: [name], female    DOB: 1929/06/18, 79 y.o.   MRN: [id number]  HPI  Here for follow up -reviewed chronic medical issues:  DM1 - on insulin pump since 2007 - follows with endo at [facility] - denies complications with neuropathy, follows also optho - denies hypoglycemia  Beast cancer - dx 08/2011 s/p resection - intol of tamoxifen,  Poor tol XRT 8/203 with syncope event (no cardiac abnormality identified) - working with med onc on same  Dyslipidemia -on simva since 2003 - the patient reports compliance with medication(s) as prescribed. Denies adverse side effects.  Hypothyroid - the patient reports compliance with medication(s) as prescribed. Denies adverse side effects.  Past Medical History  Diagnosis Date  . Hyponatremia   . Hypothyroidism   . Diabetes mellitus     type wears insulin pump  . GERD (gastroesophageal reflux disease)   . Cancer     left breast  . Arthritis     fingers  . Hypothyroid   . Hypercholesteremia   . Hypertension   . Diabetes mellitus type 1     type I- wears insulin pump  . Osteoarthritis   . PAF (paroxysmal atrial fibrillation)     One episode in 2008, spontaneously converted in the ED, no further treatment  . Thyroid nodule dx 07/2011    [place] q 57mo to follow calcified L thyroid nodule (12/08/11 [place])  . Osteoporosis, post-menopausal     DEXA 12/02/11: -3.5 (with lomax gyn), declines bisphos due to bone pain  . Spondylosis     thoracic spine  . Breast cancer   . Cancer of upper-outer quadrant of female breast 08/21/2011    ER +  PR +  Her 2 -  Ki67 9%   0.9 cm invasive lobular  Carcinoma ,s/p central lumpectomy with sentinel node biopsy,  ER/PR positive s/p re-excion on 09/16/11 with final pathology showing atypical hyperplasia    Review of Systems  Constitutional: Negative for fever or weight change.  Respiratory: Negative for cough and shortness of breath.   Cardiovascular: Negative for chest pain or palpitations.        Objective:   Physical Exam  BP 142/82  Pulse 78  Temp 98.6 F (37 C) (Oral)  Wt 145 lb (65.772 kg)  SpO2 96% Wt Readings from Last 3 Encounters:  08/17/12 145 lb (65.772 kg)  07/29/12 142 lb (64.411 kg)  06/10/12 146 lb 9.6 oz (66.497 kg)   Constitutional: She appears well-developed and well-nourished. No distress.  Neck: Normal range of motion. Neck supple. No JVD present. No thyromegaly present.  Cardiovascular: Normal rate, regular rhythm and normal heart sounds.  No murmur heard. No BLE edema. Pulmonary/Chest: Effort normal and breath sounds normal. No respiratory distress. She has no wheezes.  Skin: Skin is warm and dry. No rash noted. No erythema.  Psychiatric: She has a normal mood and affect. Her behavior is normal. Judgment and thought content normal.   Lab Results  Component Value Date   WBC 6.8 04/22/2012   HGB 12.9 04/22/2012   HCT 37.3 04/22/2012   PLT 178 04/22/2012   GLUCOSE 146* 04/22/2012   CHOL 179 06/16/2011   TRIG 54 06/16/2011   HDL 64 06/16/2011   LDLCALC 104 06/16/2011   ALT 26 03/18/2012   AST 35 03/18/2012   NA 130* 04/22/2012   K 3.9 04/22/2012   CL 97* 04/22/2012   CREATININE 0.6 05/11/2012   BUN 13.0 04/22/2012  CO2 25 04/22/2012   TSH 2.40 05/11/2012   INR 1.16 03/18/2012   HGBA1C 7.2* 05/11/2012      Assessment & Plan:  See problem list. Medications and labs reviewed today.  Time spent with pt/family today 25 minutes, greater than 50% time spent counseling patient on diabetes, breast ca and medication review. Also review of prior records

## 2012-08-17 NOTE — Assessment & Plan Note (Signed)
Breast ca dx 08/2011 - s/p lumpectomy and margin resection Intol of Tamoxifen - stopped same 01/07/12 due to side effects  considering adjunctive XRT needs Continue working with med and rad onc as ongoing, reviewed today - will help establish follow up with Dr Welton Flakes as requested

## 2012-08-19 ENCOUNTER — Ambulatory Visit: Payer: Self-pay | Admitting: Endocrinology

## 2012-08-22 ENCOUNTER — Telehealth: Payer: Self-pay | Admitting: *Deleted

## 2012-08-22 NOTE — Telephone Encounter (Signed)
Pt called states I'm having pain under my arm, mild fever but I've had a cold recently, it;'s a deep ache in my armpit and bottom of my breast, no swelling, no redness I cant feel a lump or anything. It wakes me up if I'm sleeping. i went to Dr. Ottis Stain assistant saw me around christmas and did an xray of my lungs put me on cough syrup/medicine. When I lay down it bothers me more.  Next f/u 02/19. I called Dr. Dwain Sarna and he was suppose to talk to MD about getting an MRI because I didn't want my breast smashed with a Mammogram.  I'm nervous it may be recurring or something. Will review with MD

## 2012-08-22 NOTE — Telephone Encounter (Signed)
See [name] on 1/14

## 2012-08-23 NOTE — Telephone Encounter (Signed)
Called pt on 01/3 regarding pt to be seen on 01/14. Pt advised " I cant come that soon, I live in [place]. Maybe some time later in the week at 1pm." Discussed with pt I will advise provider and pt to expect a call from scheduling concerning an appt later this week or beginning of next week. Pt verbalized understanding, thanked me for my efforts. No further questions.  Will s/w Tami regarding availability in schedules to MD/NP's.

## 2012-08-26 ENCOUNTER — Encounter: Payer: Self-pay | Admitting: *Deleted

## 2012-08-29 ENCOUNTER — Telehealth: Payer: Self-pay | Admitting: Oncology

## 2012-08-29 NOTE — Telephone Encounter (Signed)
Per email from [facility] called pt re sooner appt w/KK. See also nurse's note from 1/13. S/w pt re appt foro 1/22 @ 10:30 am w/SW. Per 1/14 pof pt was to see SW. pof did not cross over to scheduling.

## 2012-08-31 ENCOUNTER — Telehealth: Payer: Self-pay | Admitting: *Deleted

## 2012-08-31 ENCOUNTER — Encounter: Payer: Self-pay | Admitting: Physician Assistant

## 2012-08-31 ENCOUNTER — Ambulatory Visit (HOSPITAL_BASED_OUTPATIENT_CLINIC_OR_DEPARTMENT_OTHER): Payer: Medicare HMO | Admitting: Lab

## 2012-08-31 ENCOUNTER — Ambulatory Visit (HOSPITAL_BASED_OUTPATIENT_CLINIC_OR_DEPARTMENT_OTHER): Payer: Medicare HMO | Admitting: Physician Assistant

## 2012-08-31 VITALS — BP 167/75 | HR 86 | Temp 98.3°F | Resp 18 | Ht 67.0 in | Wt 146.2 lb

## 2012-08-31 DIAGNOSIS — C50919 Malignant neoplasm of unspecified site of unspecified female breast: Secondary | ICD-10-CM

## 2012-08-31 DIAGNOSIS — C50419 Malignant neoplasm of upper-outer quadrant of unspecified female breast: Secondary | ICD-10-CM

## 2012-08-31 DIAGNOSIS — Z170 Estrogen receptor positive status [ER+]: Secondary | ICD-10-CM

## 2012-08-31 LAB — CBC WITH DIFFERENTIAL/PLATELET
BASO%: 0.5 % (ref 0.0–2.0)
EOS%: 1.2 % (ref 0.0–7.0)
MCH: 30.4 pg (ref 25.1–34.0)
MCHC: 34.3 g/dL (ref 31.5–36.0)
MONO#: 1 10*3/uL — ABNORMAL HIGH (ref 0.1–0.9)
RDW: 13.9 % (ref 11.2–14.5)
WBC: 8.2 10*3/uL (ref 3.9–10.3)
lymph#: 1.8 10*3/uL (ref 0.9–3.3)

## 2012-08-31 LAB — BASIC METABOLIC PANEL (CC13)
Chloride: 97 mEq/L — ABNORMAL LOW (ref 98–107)
Potassium: 3.9 mEq/L (ref 3.5–5.1)
Sodium: 129 mEq/L — ABNORMAL LOW (ref 136–145)

## 2012-08-31 NOTE — Telephone Encounter (Signed)
Call from [facility], Triage. Scheduling dept called regarding orders for pt to have MRI. Pt will need mammo prior to MRI. Reviewed with MD. Pt has refused Mammo and will only agree to have MRI of breast. Call to Central scheduling  [phone] to find out if pt can still have MRI.  Call transferred  to Dr. Mayford Knife, who advised per protocol pt will need Mammo prior.  Dr. Mayford Knife will review protocol with Dr. Manson Passey and call back with additional information.  Direct # to Scheduling -Radiology x [phone]  Will forward message to MD for review.

## 2012-08-31 NOTE — Patient Instructions (Signed)
You are to return to our office in one month. In the interim, your left breast will be assesses by MRI, to make sure there are no areas of concerns. We will check your labs prior to the MRI.

## 2012-08-31 NOTE — Progress Notes (Signed)
[facility]  Telephone:([phone]) [phone] [phone]   OFFICE PROGRESS NOTE   Cc:  Rene Paci, MD [address] [facility] [address]  [place] [address]   DIAGNOSIS: 79 year old female Stage I invasive lobular carcinoma s/p central lumpectomy on 09/02/11   PRIOR THERAPY:  1. Status post lumpectomy on 09/02/2011 with the final pathology revealing an invasive grade 1 lobular carcinoma measuring 0.9 cm with lobular carcinoma in situ noted. Medial and superior margins were focally positive. Patient subsequently underwent additional excision of the superior and deep margins that showed benign breast parenchyma with fibrocystic changes no atypia hyperplasia or malignancy. One sentinel node was negative for metastatic disease.   2. Patient Was started on tamoxifen but she has not been able to tolerate it and [place] it was discontinued on 01/07/2012.   3. Patient began radiation therapy last week at [facility]. She has only received 1 dose but then developed hypertension and was seen the ER at [facility] and subsequently transferred to [facility] for work up including a cardiac catherterization   #4 patient has decided against radiation therapy or any kind of other antiestrogen R. Adjuvant therapy. She is very happy with her decision.   CURRENT THERAPY: Observation   [name] 79 y.o. female returns for a follow up appointment. Her main complaint is pain at the  Left  lymph node dissection area, worse with movement. No other areas of concern. She denies appearnace on new masses in the breast region. No shortness of breath, cough, vision changes, or headaches. Nobone pain,hot flashes,vaginal discharge or increased fatigue. Denies any nausea or vomiting. She wishes to undergo a breast MRI  To rule out metastatic disease since these symptoms are constant, and have been present since November of [address].  Nausea headache forgetfulness  xerostomia.  Activity level    Past Medical History  Diagnosis Date  . Hyponatremia   . Hypothyroidism   . Diabetes mellitus     type wears insulin pump  . GERD (gastroesophageal reflux disease)   . Cancer     left breast  . Arthritis     fingers  . Hypothyroid   . Hypercholesteremia   . Hypertension   . Diabetes mellitus type 1     type I- wears insulin pump  . Osteoarthritis   . PAF (paroxysmal atrial fibrillation)     One episode in 2008, spontaneously converted in the ED, no further treatment  . Thyroid nodule dx 07/2011    [place] q 17mo to follow calcified L thyroid nodule (12/08/11 [place])  . Osteoporosis, post-menopausal     DEXA 12/02/11: -3.5 (with lomax gyn), declines bisphos due to bone pain  . Spondylosis     thoracic spine  . Breast cancer   . Cancer of upper-outer quadrant of female breast 08/21/2011    ER +  PR +  Her 2 -  Ki67 9%   0.9 cm invasive lobular  Carcinoma ,s/p central lumpectomy with sentinel node biopsy,  ER/PR positive s/p re-excion on 09/16/11 with final pathology showing atypical hyperplasia     Past Surgical History  Procedure Date  . Cardiovascular stress test 03/17/2012  . Breast surgery     biopsy  . Breast lumpectomy   . Appendectomy   . Abdominal hysterectomy   . Bladder repair 5 yrs ago  . Tonsillectomy  age 33  . Appendectomy age 32  . Abdominal hysterectomy yrs ago  .  Breast surgery  yrs ago    br bx  . Breast surgery 01/ 23/2013    left central lumpectomy and snbx, re-excision lumpectomy    Current Outpatient Prescriptions  Medication Sig Dispense Refill  . Aclidinium Bromide (TUDORZA PRESSAIR) 400 MCG/ACT AEPB Inhale 1 Act into the lungs 2 (two) times daily.  1 each  11  . ALPRAZolam (XANAX) 0.5 MG tablet Take 0.25 mg by mouth 3 (three) times daily as needed. For Anxiety      . aspirin 81 MG tablet Take 81 mg by mouth daily.      [name] BIOGAIA PROBIOTIC (BIOGAIA PROBIOTIC) LIQD 3oz before breakfast      . Calcium Citrate-Vitamin D  (CITRACAL + D PO) Take 1,000 mg by mouth daily.      . cholecalciferol (VITAMIN D) 1000 UNITS tablet Take 1,000 Units by mouth daily.       . fish oil-omega-3 fatty acids 1000 MG capsule Take 2 g by mouth daily.      . hydrochlorothiazide (HYDRODIURIL) 12.5 MG tablet Take 1 tablet (12.5 mg total) by mouth daily.  30 tablet  3  . insulin glulisine (APIDRA) 100 UNIT/ML injection Use as indicated in insulin pump, total of 60 units per day. 4 vials per month      . Insulin Infusion Pump Supplies (INSULIN PUMP SYRINGE RESERVOIR) MISC 1 Device by Does not apply route every 3 (three) days. Animas 2 ml      . IV Sets-Tubing (INFUSION SET 23" COMFORT) MISC 1 Device by Does not apply route every 3 (three) days. 13 mm catheter      . levothyroxine (SYNTHROID, LEVOTHROID) 75 MCG tablet Take 75 mcg by mouth daily.      . Multiple Vitamin (MULTIVITAMIN) tablet Take 1 tablet by mouth daily. For breast and bone health      . ONE TOUCH ULTRA TEST test strip TEST as directed 8 TIMES PER DAY  102 each  3  . promethazine-codeine (PHENERGAN WITH CODEINE) 6.25-10 MG/5ML syrup Take 5 mLs by mouth every 4 (four) hours as needed for cough.  240 mL  0  . simvastatin (ZOCOR) 20 MG tablet Take 20 mg by mouth at bedtime.       . vitamin C (ASCORBIC ACID) 500 MG tablet Take 1,000 mg by mouth daily.        ALLERGIES:  is allergic to augmentin; keflex; prednisone; hydrocodone; and latex.  REVIEW OF SYSTEMS:  No respiratory or cardiac complaints.  The rest of the 14-point review of system was negative.   Filed Vitals:   08/31/12 1030  BP: 167/75  Pulse: 86  Temp: 98.3 F (36.8 C)  Resp: 18   Wt Readings from Last 3 Encounters:  08/31/12 146 lb 3.2 oz (66.316 kg)  08/17/12 145 lb (65.772 kg)  07/29/12 142 lb (64.411 kg)     PHYSICAL EXAMINATION:  BP 167/75  Pulse 86  Temp 98.3 F (36.8 C) (Oral)  Resp 18  Ht 5\' 7"  (1.702 m)  Wt 146 lb 3.2 oz (66.316 kg)  BMI 22.90 kg/m2  Well-developed nourished female  appearing much younger than her stated age  HEENT:  PERRLA sclerae anicteric no conjunctival pallor oral mucosa is moist  neck is supple no palpable adenopathy lungs are clear  cardiovascular is regular rate rhythm no murmurs gallops or rubs  abdomen is soft nontender nondistended bowel sounds are present no HSM  extremities no edema  neuro patient's alert oriented otherwise nonfocal  Breast Exam:  left breast with central lumpectomy, well healed. L axillary area demonstrates a well healed lymph node disection area, tender to palpation but with no masses found. ECOG PERFORMANCE STATUS: 0     LABORATORY/RADIOLOGY DATA:  Lab Results  Component Value Date   WBC 8.2 08/31/2012   HGB 12.8 08/31/2012   HCT 37.2 08/31/2012   PLT 185 08/31/2012   GLUCOSE 231* 08/31/2012   CHOL 179 06/16/2011   TRIG 54 06/16/2011   HDL 64 06/16/2011   LDLCALC 104 06/16/2011   ALKPHOS 69 03/18/2012   ALT 26 03/18/2012   AST 35 03/18/2012   NA 129* 08/31/2012   K 3.9 08/31/2012   CL 97* 08/31/2012   CREATININE 0.8 08/31/2012   BUN 16.0 08/31/2012   CO2 25 08/31/2012   INR 1.16 03/18/2012   HGBA1C 7.2* 05/11/2012    No results found.     ASSESSMENT AND PLAN:   1. [name] 79 y.o. female with   #1 stage IA invasive lobular carcinoma of the left breast status post central lumpectomy the tumor was ER positive measuring 0.9 cm. PR +35% ER +97% HER-2/neu negative proliferation marker 9% and low.she has no clinical evidence of recurrent disease   #2 patient Had been on tamoxifen however on 01/07/2012 she discontinued it due to severe myalgias and arthralgias.   #3 patient has declined radiation therapy.   PLAN:.   #1 we will continue to follow the patient every 6 months. Her next visit with Dr. Welton Flakes is scheduled in 6 months. In the interim, will proceed with MRI of the left breast to rule out occult metastatic disease. BMET is to be drawn today to evaluate renal functions prior to procedure. If abnormalities are  found, other instructions are to follow. Dr. Welton Flakes has seen and evaluated the patient . All questions were answered. The patient knows to call the clinic with any problems, questions or concerns.   Time spent 15 minutes counseling the patient face to face. The total time spent in the appointment was 30 minutes.

## 2012-09-01 ENCOUNTER — Other Ambulatory Visit: Payer: Self-pay | Admitting: Medical Oncology

## 2012-09-01 DIAGNOSIS — C50419 Malignant neoplasm of upper-outer quadrant of unspecified female breast: Secondary | ICD-10-CM

## 2012-09-02 ENCOUNTER — Telehealth: Payer: Self-pay | Admitting: Oncology

## 2012-09-02 ENCOUNTER — Encounter: Payer: Self-pay | Admitting: Oncology

## 2012-09-02 NOTE — Telephone Encounter (Signed)
mammo order given and mri-breast order changed. Pt scheduled for mammo 2/5 @ [facility] and central will call pt re beast mri appt to be done @ [facility]. lmonvm for [name] in central that mri order has been changed and pt will have mammo 2/5. S/w pt she is aware of mammo appt and that central will contact her re mri. Pt was given February appt schedule prior to leaving 1/22.

## 2012-09-14 ENCOUNTER — Ambulatory Visit
Admission: RE | Admit: 2012-09-14 | Discharge: 2012-09-14 | Disposition: A | Discharge status: Home / Self Care | Payer: Medicare HMO | Source: Ambulatory Visit | Attending: Oncology | Admitting: Oncology

## 2012-09-14 DIAGNOSIS — C50419 Malignant neoplasm of upper-outer quadrant of unspecified female breast: Secondary | ICD-10-CM

## 2012-09-19 ENCOUNTER — Ambulatory Visit (HOSPITAL_COMMUNITY)
Admission: RE | Admit: 2012-09-19 | Discharge: 2012-09-19 | Disposition: A | Discharge status: Home / Self Care | Payer: Medicare HMO | Source: Ambulatory Visit | Attending: Oncology | Admitting: Oncology

## 2012-09-19 ENCOUNTER — Other Ambulatory Visit: Payer: Self-pay | Admitting: Oncology

## 2012-09-19 DIAGNOSIS — C50419 Malignant neoplasm of upper-outer quadrant of unspecified female breast: Secondary | ICD-10-CM

## 2012-09-19 MED ORDER — GADOBENATE DIMEGLUMINE 529 MG/ML IV SOLN
13.0000 mL | Freq: Once | INTRAVENOUS | Status: AC | PRN
  Administered 2012-09-19: 13 mL via INTRAVENOUS

## 2012-09-23 ENCOUNTER — Other Ambulatory Visit: Payer: Self-pay | Admitting: Emergency Medicine

## 2012-09-23 ENCOUNTER — Telehealth: Payer: Self-pay | Admitting: Emergency Medicine

## 2012-09-23 NOTE — Telephone Encounter (Signed)
Notified patient that MRI results are WNL.

## 2012-09-28 ENCOUNTER — Telehealth: Payer: Self-pay | Admitting: Oncology

## 2012-09-28 ENCOUNTER — Ambulatory Visit (HOSPITAL_BASED_OUTPATIENT_CLINIC_OR_DEPARTMENT_OTHER): Payer: Medicare HMO | Admitting: Oncology

## 2012-09-28 ENCOUNTER — Other Ambulatory Visit (HOSPITAL_BASED_OUTPATIENT_CLINIC_OR_DEPARTMENT_OTHER): Payer: Medicare HMO | Admitting: Lab

## 2012-09-28 VITALS — BP 180/71 | HR 83 | Temp 98.6°F | Resp 20 | Ht 67.0 in | Wt 146.4 lb

## 2012-09-28 DIAGNOSIS — C50419 Malignant neoplasm of upper-outer quadrant of unspecified female breast: Secondary | ICD-10-CM

## 2012-09-28 DIAGNOSIS — C50412 Malignant neoplasm of upper-outer quadrant of left female breast: Secondary | ICD-10-CM

## 2012-09-28 DIAGNOSIS — Z170 Estrogen receptor positive status [ER+]: Secondary | ICD-10-CM

## 2012-09-28 LAB — COMPREHENSIVE METABOLIC PANEL (CC13)
Alkaline Phosphatase: 93 U/L (ref 40–150)
CO2: 27 mEq/L (ref 22–29)
Creatinine: 0.8 mg/dL (ref 0.6–1.1)
Glucose: 127 mg/dl — ABNORMAL HIGH (ref 70–99)
Total Bilirubin: 0.38 mg/dL (ref 0.20–1.20)

## 2012-09-28 LAB — CBC WITH DIFFERENTIAL/PLATELET
Eosinophils Absolute: 0.2 10*3/uL (ref 0.0–0.5)
HCT: 37.1 % (ref 34.8–46.6)
LYMPH%: 30.6 % (ref 14.0–49.7)
MCHC: 34.9 g/dL (ref 31.5–36.0)
MCV: 88 fL (ref 79.5–101.0)
MONO#: 0.8 10*3/uL (ref 0.1–0.9)
MONO%: 11.8 % (ref 0.0–14.0)
NEUT#: 3.8 10*3/uL (ref 1.5–6.5)
NEUT%: 53.9 % (ref 38.4–76.8)
Platelets: 192 10*3/uL (ref 145–400)
WBC: 7 10*3/uL (ref 3.9–10.3)

## 2012-09-28 NOTE — Telephone Encounter (Signed)
gv pt appt schedule for February 2015.  °

## 2012-09-28 NOTE — Patient Instructions (Addendum)
Doing well  MRI was normal

## 2012-10-09 NOTE — Progress Notes (Signed)
OFFICE PROGRESS NOTE  CC Dr. Beather Arbour Dr. Antony Blackbird Dr. Emelia Loron Rene Paci, MD [address] [facility] [address] [place] [address]  DIAGNOSIS: 79 year old female Stage I invasive lobular carcinoma s/p central lumpectomy on 09/02/11  PRIOR THERAPY: 1. Status post lumpectomy on 09/02/2011 with the final pathology revealing an invasive grade 1 lobular carcinoma measuring 0.9 cm with lobular carcinoma in situ noted. Medial and superior margins were focally positive. Patient subsequently underwent additional excision of the superior and deep margins that showed benign breast parenchyma with fibrocystic changes no atypia hyperplasia or malignancy. One sentinel node was negative for metastatic disease.  2. Patient Was started on tamoxifen but she has not been able to tolerate it and [place] it was discontinued on 01/07/2012.  3. Patient began radiation therapy last week at [facility] cancer center. She has only received 1 dose but then developed hypertension and was seen the ER at [facility] and subsequently transferred to [facility] for work up including a cardiac catherterization  #4 patient has decided against radiation therapy or any kind of other antiestrogen R. Adjuvant therapy. She is very happy with her decision.  CURRENT THERAPY: Observation   INTERVAL HISTORY: [name] 79 y.o. female returns for Follow up visit .overall she is doing well. She had a MRI of breasts and they re negative. She has pain periodically. No other complaints.  MEDICAL HISTORY: Past Medical History  Diagnosis Date  . Hyponatremia   . Hypothyroidism   . Diabetes mellitus     type wears insulin pump  . GERD (gastroesophageal reflux disease)   . Cancer     left breast  . Arthritis     fingers  . Hypothyroid   . Hypercholesteremia   . Hypertension   . Diabetes mellitus type 1     type I- wears insulin pump  . Osteoarthritis   . PAF (paroxysmal atrial  fibrillation)     One episode in 2008, spontaneously converted in the ED, no further treatment  . Thyroid nodule dx 07/2011    [place] q 83mo to follow calcified L thyroid nodule (12/08/11 [place])  . Osteoporosis, post-menopausal     DEXA 12/02/11: -3.5 (with lomax gyn), declines bisphos due to bone pain  . Spondylosis     thoracic spine  . Breast cancer   . Cancer of upper-outer quadrant of female breast 08/21/2011    ER +  PR +  Her 2 -  Ki67 9%   0.9 cm invasive lobular  Carcinoma ,s/p central lumpectomy with sentinel node biopsy,  ER/PR positive s/p re-excion on 09/16/11 with final pathology showing atypical hyperplasia     ALLERGIES:  is allergic to augmentin; keflex; prednisone; hydrocodone; and latex.  MEDICATIONS:  Current Outpatient Prescriptions  Medication Sig Dispense Refill  . ALPRAZolam (XANAX) 0.5 MG tablet Take 0.25 mg by mouth 3 (three) times daily as needed. For Anxiety      . aspirin 81 MG tablet Take 81 mg by mouth daily.      [name] BIOGAIA PROBIOTIC (BIOGAIA PROBIOTIC) LIQD 3oz before breakfast      . Calcium Citrate-Vitamin D (CITRACAL + D PO) Take 1,000 mg by mouth daily.      . cholecalciferol (VITAMIN D) 1000 UNITS tablet Take 1,000 Units by mouth daily.       . fish oil-omega-3 fatty acids 1000 MG capsule Take 2 g by mouth daily.      [name] glucose  blood (ONE TOUCH ULTRA TEST) test strip Used to test blood sugars 8 times a day      . hydrochlorothiazide (HYDRODIURIL) 12.5 MG tablet Take 1 tablet (12.5 mg total) by mouth daily.  30 tablet  3  . insulin glulisine (APIDRA) 100 UNIT/ML injection Use as indicated in insulin pump, total of 60 units per day. 4 vials per month      . Insulin Infusion Pump Supplies (INSULIN PUMP SYRINGE RESERVOIR) MISC 1 Device by Does not apply route every 3 (three) days. Animas 2 ml      . IV Sets-Tubing (INFUSION SET 23" COMFORT) MISC 1 Device by Does not apply route every 3 (three) days. 13 mm catheter      . levothyroxine (SYNTHROID, LEVOTHROID) 75 MCG  tablet Take 75 mcg by mouth daily.      . Multiple Vitamin (MULTIVITAMIN) tablet Take 1 tablet by mouth daily. For breast and bone health      . ONE TOUCH ULTRA TEST test strip TEST as directed 8 TIMES PER DAY  102 each  3  . simvastatin (ZOCOR) 20 MG tablet Take 20 mg by mouth at bedtime.       . vitamin C (ASCORBIC ACID) 500 MG tablet Take 1,000 mg by mouth daily.       No current facility-administered medications for this visit.    SURGICAL HISTORY:  Past Surgical History  Procedure Laterality Date  . Cardiovascular stress test  03/17/2012  . Breast surgery      biopsy  . Breast lumpectomy    . Appendectomy    . Abdominal hysterectomy    . Bladder repair  5 yrs ago  . Tonsillectomy   age 74  . Appendectomy  age 43  . Abdominal hysterectomy  yrs ago  . Breast surgery   yrs ago    br bx  . Breast surgery  01/ 23/2013    left central lumpectomy and snbx, re-excision lumpectomy    REVIEW OF SYSTEMS:  Pertinent items are noted in HPI.   PHYSICAL EXAMINATION: Well-developed nourished female appearing much younger than her stated age HEENT exam EOMI PERRLA sclerae anicteric no conjunctival pallor oral mucosa is moist neck is supple no palpable adenopathy lungs are clear cardiovascular is regular rate rhythm no murmurs gallops or rubs abdomen is soft nontender nondistended bowel sounds are present no HSM extremities no edema neuro patient's alert oriented otherwise nonfocal Breast Exam: left breast with central lumpectomy healing well, no other masses ECOG PERFORMANCE STATUS: 0 - Asymptomatic  Blood pressure 180/71, pulse 83, temperature 98.6 F (37 C), temperature source Oral, resp. rate 20, height 5\' 7"  (1.702 m), weight 146 lb 6.4 oz (66.407 kg).  LABORATORY DATA: Lab Results  Component Value Date   WBC 7.0 09/28/2012   HGB 13.0 09/28/2012   HCT 37.1 09/28/2012   MCV 88.0 09/28/2012   PLT 192 09/28/2012      Chemistry      Component Value Date/Time   NA 133* 09/28/2012 1354    NA 135 03/18/2012 0649   NA 137 06/16/2011   K 3.5 09/28/2012 1354   K 4.1 03/18/2012 0649   CL 98 09/28/2012 1354   CL 102 03/18/2012 0649   CO2 27 09/28/2012 1354   CO2 18* 03/18/2012 0649   BUN 11.4 09/28/2012 1354   BUN 11 03/18/2012 0649   BUN 9 06/16/2011   CREATININE 0.8 09/28/2012 1354   CREATININE 0.6 05/11/2012 1343   CREATININE 0.6 06/16/2011  GLU 102 06/16/2011      Component Value Date/Time   CALCIUM 9.6 09/28/2012 1354   CALCIUM 9.5 03/18/2012 0649   ALKPHOS 93 09/28/2012 1354   ALKPHOS 69 03/18/2012 0649   AST 41* 09/28/2012 1354   AST 35 03/18/2012 0649   ALT 32 09/28/2012 1354   ALT 26 03/18/2012 0649   BILITOT 0.38 09/28/2012 1354   BILITOT 0.5 03/18/2012 0649     REASON: [id number]: Following a conversation with Dr. Dwain Sarna at the Breast Conference on 09/16/11, specimen # 2 actually represents additional superior and deep margins. Part 2 specimen site changed from Breast, excision, additional anterior and deep to Breast, excision, additional superior and deep margins. 09/16/11 09:41:52 AM (gt) FINAL DIAGNOSIS Diagnosis 1. Breast, lumpectomy, Left - INVASIVE GRADE I LOBULAR CARCINOMA, SPANNING 0.9 CM. - LOBULAR CARCINOMA IN SITU PRESENT. - LYMPH/VASCULAR INVASION NOT IDENTIFIED. - MEDIAL AND SUPERIOR MARGINS ARE FOCALLY POSITIVE. - SEE ONCOLOGY TEMPLATE AND COMMENT. 2. Breast, excision, Additional superior and deep margins - BENIGN BREAST PARENCHYMA SHOWING FIBROCYSTIC CHANGES. - NO ATYPIA, HYPERPLASIA OR MALIGNANCY IDENTIFIED. 3. Lymph node, sentinel, biopsy, Left axilla - ONE BENIGN LYMPH NODE WITH NO TUMOR SEEN (0/1). - SEE COMMENT. Microscopic Comment 1. BREAST, INVASIVE TUMOR, WITH LYMPH NODE SAMPLING Specimen, including laterality: Left breast lumpectomy with additional anterior and deep margin, resection and sentinel lymph node biopsy. Procedure: Left breast lumpectomy with additional anterior and deep margin, resection and sentinel lymph node biopsy. Grade:  I Tubule formation: 3. Nuclear pleomorphism: 1. Mitotic:1. Tumor size (glass slide measurement): 0.9 cm. Margins: Invasive, distance to closest margin: Medial and superior margin is focally positive. Lymphovascular invasion: Not identified. 1 of 3 Amended copy Corrected FINAL for Okray, Tailynn C ([id number]) Microscopic Comment(continued) Ductal carcinoma in situ: No. Lobular neoplasia: Yes, lobular carcinoma in situ present. Tumor focality: Unifocal. Treatment effect: Not applicable. Extent of tumor: Tumor involves breast parenchyma and deep dermis. Lymph nodes: # examined: .1 Lymph nodes with metastasis: 0. Breast prognostic profile: Performed on [id number] Estrogen receptor: 97%, positive. Progesterone receptor: 35%, positive. Her 2 neu: 1.03, not amplified. Ki-67: 9%, low. Non-neoplastic breast: A small amount of usual ductal hyperplasia is present with calcifications. Benign stromal and vascular calcifications are also present. TNM: pT1b, pN0, MX Comments: A cytokeratin AE1/AE3 stain is performed on two blocks (1B and 1G) to determine the extent of the invasive lobular carcinoma. The stains confirm that the medial and superior margins are focally positive. Dr. Colonel Bald has seen this case in consultation with agreement of the positive margin status. A Her-2/neu by C[facility] will be repeated on the current tumor. RAH:gt, 09/07/11) 3. Immunohistochemical staining is performed on the sentinel lymph node for cytokeratin AE1/AE3. The stain is negative confirming that there is no metastatic carcinoma. (RH:gt, 09/07/11) Pecola Leisure MD   ASSESSMENT: 79 year old female with  #1 stage IA invasive lobular carcinoma of the left breast status post central lumpectomy the tumor was ER positive measuring 0.9 cm. PR +35% ER +97% HER-2/neu negative proliferation marker 9% and low.she has no clinical evidence of recurrent disease  #2 patient Had been on tamoxifen however on 01/07/2012 she  discontinued it due to severe myalgias and arthralgias.  #3 patient has declined radiation therapy.  #4 MRI of breasts reveal no evidence disease or recurrence.  PLAN:. #1 we will continue to follow the patient every 12 months  All questions were answered. The patient knows to call the clinic with any problems, questions or concerns. We can certainly see the patient much  sooner if necessary.  I spent 15 minutes counseling the patient face to face. The total time spent in the appointment was 30 minutes.    Drue Second, MD Medical/Oncology [facility] [phone] (beeper) [phone] (Office)

## 2012-11-23 LAB — LIPID PANEL: HDL: 60 mg/dL (ref 35–70)

## 2013-02-15 ENCOUNTER — Encounter: Payer: Self-pay | Admitting: Internal Medicine

## 2013-02-15 ENCOUNTER — Other Ambulatory Visit (INDEPENDENT_AMBULATORY_CARE_PROVIDER_SITE_OTHER): Payer: Medicare HMO

## 2013-02-15 ENCOUNTER — Ambulatory Visit (INDEPENDENT_AMBULATORY_CARE_PROVIDER_SITE_OTHER): Payer: Medicare HMO | Admitting: Internal Medicine

## 2013-02-15 VITALS — BP 144/68 | HR 76 | Temp 98.2°F | Wt 143.4 lb

## 2013-02-15 DIAGNOSIS — R5383 Other fatigue: Secondary | ICD-10-CM

## 2013-02-15 DIAGNOSIS — R5381 Other malaise: Secondary | ICD-10-CM

## 2013-02-15 DIAGNOSIS — E7800 Pure hypercholesterolemia, unspecified: Secondary | ICD-10-CM

## 2013-02-15 DIAGNOSIS — I10 Essential (primary) hypertension: Secondary | ICD-10-CM

## 2013-02-15 DIAGNOSIS — K589 Irritable bowel syndrome without diarrhea: Secondary | ICD-10-CM

## 2013-02-15 DIAGNOSIS — E109 Type 1 diabetes mellitus without complications: Secondary | ICD-10-CM

## 2013-02-15 LAB — CBC WITH DIFFERENTIAL/PLATELET
Basophils Relative: 0.3 % (ref 0.0–3.0)
Eosinophils Absolute: 0.1 10*3/uL (ref 0.0–0.7)
Eosinophils Relative: 1 % (ref 0.0–5.0)
Lymphocytes Relative: 23.4 % (ref 12.0–46.0)
Monocytes Relative: 10.4 % (ref 3.0–12.0)
Neutrophils Relative %: 64.9 % (ref 43.0–77.0)
RBC: 4.35 Mil/uL (ref 3.87–5.11)
WBC: 6.8 10*3/uL (ref 4.5–10.5)

## 2013-02-15 LAB — HEPATIC FUNCTION PANEL
ALT: 22 U/L (ref 0–35)
AST: 32 U/L (ref 0–37)
Alkaline Phosphatase: 98 U/L (ref 39–117)
Bilirubin, Direct: 0.1 mg/dL (ref 0.0–0.3)
Total Protein: 7.3 g/dL (ref 6.0–8.3)

## 2013-02-15 MED ORDER — ALPRAZOLAM 0.5 MG PO TABS: 0.5000 mg | ORAL_TABLET | Freq: Every day | ORAL | Status: DC | PRN

## 2013-02-15 NOTE — Assessment & Plan Note (Signed)
On insulin pump since 2007 - follows with endo to manage same (but notes difficult drive to [facility] at her age) Reviewed settings, works with "pump nurse" q6mo for review Denies complications - sees optho annually On ASA, statin  Lab Results  Component Value Date   HGBA1C 7.0* 07/13/2012   

## 2013-02-15 NOTE — Assessment & Plan Note (Signed)
BP Readings from Last 3 Encounters:  02/15/13 152/68  09/28/12 180/71  08/31/12 167/75   Manual recheck 144/76 Reports home systolic pressure ranges 130 to 140s, diastolic in the 60s Given age and comorbidities, we'll not titrate for tighter control The current medical regimen is effective;  continue present plan and medications.

## 2013-02-15 NOTE — Assessment & Plan Note (Signed)
On statin - reports lipids 07/2012 "good" with [facility] endo

## 2013-02-15 NOTE — Patient Instructions (Signed)
It was good to see you today. We have reviewed your interval medical events records including labs and tests today Medications reviewed, no changes at this time. Refill on medication(s) as discussed today. Test(s) ordered today. Your results will be released to MyChart (or called to you) after review, usually within 72hours after test completion. If any changes need to be made, you will be notified at that same time. Please schedule followup in 6 months, call sooner if problems.

## 2013-02-15 NOTE — Progress Notes (Signed)
Subjective:    Patient ID: [name], female    DOB: 1929/07/04, 79 y.o.   MRN: [id number]  HPI  Here for follow up -reviewed chronic medical issues:  DM1 - on insulin pump since 2007 - follows with endo at [facility] - denies complications with neuropathy, follows also optho - denies hypoglycemia  Beast cancer - dx 08/2011 s/p resection - intol of tamoxifen, Poor tol XRT 03/2012 with syncope event (no cardiac abnormality identified) - working with med onc on same  Dyslipidemia -on simva since 2003 - the patient reports compliance with medication(s) as prescribed. Denies adverse side effects.  Hypothyroid - the patient reports compliance with medication(s) as prescribed. Denies adverse side effects.  Past Medical History  Diagnosis Date  . Hyponatremia   . Hypothyroidism   . Diabetes mellitus     type wears insulin pump  . GERD (gastroesophageal reflux disease)   . Cancer     left breast  . Arthritis     fingers  . Hypothyroid   . Hypercholesteremia   . Hypertension   . Diabetes mellitus type 1     type I- wears insulin pump  . Osteoarthritis   . PAF (paroxysmal atrial fibrillation)     One episode in 2008, spontaneously converted in the ED, no further treatment  . Thyroid nodule dx 07/2011    [place] q 62mo to follow calcified L thyroid nodule (12/08/11 [place])  . Osteoporosis, post-menopausal     DEXA 12/02/11: -3.5 (with lomax gyn), declines bisphos due to bone pain  . Spondylosis     thoracic spine  . Breast cancer   . Cancer of upper-outer quadrant of female breast 08/21/2011    ER +  PR +  Her 2 -  Ki67 9%   0.9 cm invasive lobular  Carcinoma ,s/p central lumpectomy with sentinel node biopsy,  ER/PR positive s/p re-excion on 09/16/11 with final pathology showing atypical hyperplasia    Review of Systems  Constitutional: Positive for fatigue. Negative for fever and unexpected weight change.  Respiratory: Negative for cough and shortness of breath.   Gastrointestinal: Positive for  diarrhea and constipation. Negative for nausea and abdominal pain.       Objective:   Physical Exam  BP 152/68  Pulse 76  Temp(Src) 98.2 F (36.8 C) (Oral)  Wt 143 lb 6.4 oz (65.046 kg)  BMI 22.45 kg/m2  SpO2 94% Wt Readings from Last 3 Encounters:  02/15/13 143 lb 6.4 oz (65.046 kg)  09/28/12 146 lb 6.4 oz (66.407 kg)  08/31/12 146 lb 3.2 oz (66.316 kg)   Constitutional: She appears well-developed and well-nourished. No distress.  Neck: Normal range of motion. Neck supple. No JVD present. No thyromegaly present.  Cardiovascular: Normal rate, regular rhythm and normal heart sounds.  No murmur heard. No BLE edema. Pulmonary/Chest: Effort normal and breath sounds normal. No respiratory distress. She has no wheezes.  Abdomen: SNTND, +BS Skin: Skin is warm and dry. No rash noted. No erythema.  Psychiatric: She has a normal mood and affect. Her behavior is normal. Judgment and thought content normal.   Lab Results  Component Value Date   WBC 7.0 09/28/2012   HGB 13.0 09/28/2012   HCT 37.1 09/28/2012   PLT 192 09/28/2012   GLUCOSE 127* 09/28/2012   CHOL 179 06/16/2011   TRIG 54 06/16/2011   HDL 64 06/16/2011   LDLCALC 104 06/16/2011   ALT 32 09/28/2012   AST 41* 09/28/2012   NA 133* 09/28/2012  K 3.5 09/28/2012   CL 98 09/28/2012   CREATININE 0.8 09/28/2012   BUN 11.4 09/28/2012   CO2 27 09/28/2012   TSH 1.88 07/13/2012   INR 1.16 03/18/2012   HGBA1C 7.0* 07/13/2012      Assessment & Plan:  See problem list. Medications and labs reviewed today.  IBS with alt diarrhea and constipation - chronic, prior GI eval at [facility] for same reviewed - reassurance provided - will check CBC at pt request to ensure not "losing blood", but no hematochezia or melena reported - exam rectal deferred at pt request today  Fatigue - nonspecific symptoms/exam - check screening labs

## 2013-04-19 ENCOUNTER — Other Ambulatory Visit: Payer: Self-pay | Admitting: Gynecology

## 2013-04-19 DIAGNOSIS — M810 Age-related osteoporosis without current pathological fracture: Secondary | ICD-10-CM

## 2013-04-21 ENCOUNTER — Ambulatory Visit
Admission: RE | Admit: 2013-04-21 | Discharge: 2013-04-21 | Disposition: A | Discharge status: Home / Self Care | Payer: Medicare HMO | Source: Ambulatory Visit | Attending: Gynecology | Admitting: Gynecology

## 2013-04-21 DIAGNOSIS — M810 Age-related osteoporosis without current pathological fracture: Secondary | ICD-10-CM

## 2013-06-15 ENCOUNTER — Other Ambulatory Visit: Payer: Self-pay

## 2013-07-11 ENCOUNTER — Ambulatory Visit (INDEPENDENT_AMBULATORY_CARE_PROVIDER_SITE_OTHER): Payer: Medicare Other | Admitting: General Surgery

## 2013-07-17 ENCOUNTER — Encounter (INDEPENDENT_AMBULATORY_CARE_PROVIDER_SITE_OTHER): Payer: Self-pay | Admitting: General Surgery

## 2013-07-17 ENCOUNTER — Ambulatory Visit (INDEPENDENT_AMBULATORY_CARE_PROVIDER_SITE_OTHER): Payer: Medicare PPO | Admitting: General Surgery

## 2013-07-17 ENCOUNTER — Telehealth (INDEPENDENT_AMBULATORY_CARE_PROVIDER_SITE_OTHER): Payer: Self-pay | Admitting: *Deleted

## 2013-07-17 VITALS — BP 130/80 | HR 68 | Temp 97.2°F | Resp 16 | Ht 67.0 in | Wt 148.4 lb

## 2013-07-17 DIAGNOSIS — Z853 Personal history of malignant neoplasm of breast: Secondary | ICD-10-CM

## 2013-07-17 NOTE — Telephone Encounter (Signed)
I spoke with pts husband and he stated that he would have patient to call me back to get appt information.  I was calling to inform her of her yearly mammogram appt at the breast center on 09/18/13 with an arrival time of 11:30am.  Pt is to take her insurance card and photo ID with her to this appt.

## 2013-07-17 NOTE — Progress Notes (Signed)
Subjective:     Patient ID: [name], female   DOB: 1929-08-03, 79 y.o.   MRN: [id number]  HPI 68 yof who underwent a central lumpectomy on the left side for a stage I invasive lobular carcinoma. She also underwent a sentinel lymph node biopsy that was obviously negative. She has been unable to tolerate any adjuvant therapy. She has had a nl mr and mm in the past year.  She is back to all normal activities. She reports no issues with her left arm or any swelling. She notes no changes on her own self exams.   Review of Systems  Constitutional: Negative for fever, chills and unexpected weight change.  HENT: Positive for congestion and voice change. Negative for hearing loss, sore throat and trouble swallowing.   Eyes: Negative for visual disturbance.  Respiratory: Positive for cough. Negative for wheezing.   Cardiovascular: Negative for chest pain, palpitations and leg swelling.  Gastrointestinal: Negative for nausea, vomiting, abdominal pain, diarrhea, constipation, blood in stool, abdominal distention and anal bleeding.  Genitourinary: Negative for hematuria, vaginal bleeding and difficulty urinating.  Musculoskeletal: Negative for arthralgias.  Skin: Negative for rash and wound.  Neurological: Negative for seizures, syncope and headaches.  Hematological: Negative for adenopathy. Does not bruise/bleed easily.  Psychiatric/Behavioral: Negative for confusion.       Objective:   Physical Exam  Vitals reviewed. Constitutional: She appears well-developed and well-nourished.  Pulmonary/Chest: Right breast exhibits no inverted nipple, no mass, no nipple discharge, no skin change and no tenderness. Left breast exhibits no mass.    Lymphadenopathy:    She has no cervical adenopathy.    She has no axillary adenopathy.       Right: No supraclavicular adenopathy present.       Assessment:     History stage I breast cancer     Plan:     She has no clinical evidence of any  recurrence. She is at higher risk due to the fact that she was unable to tolerate either radiation therapy or antiestrogen therapy. She is due to get her mammogram in January. She is going to continue her on self exams. I will plan on seeing her back in one year. She is due to be seen at the [facility] in January I think this could probably be a regularly scheduled follow up in June.

## 2013-07-17 NOTE — Addendum Note (Signed)
Addended by: Ethlyn Gallery on: 07/17/2013 03:00 PM   Modules accepted: Orders

## 2013-07-18 NOTE — Telephone Encounter (Signed)
Pt returned call and was given the information about her scheduled MGM.

## 2013-07-27 ENCOUNTER — Encounter: Payer: Self-pay | Admitting: *Deleted

## 2013-07-28 ENCOUNTER — Telehealth: Payer: Self-pay | Admitting: *Deleted

## 2013-07-28 NOTE — Telephone Encounter (Signed)
sw pt made her aware that her appt for 2/15 is rescheduled to 01/24/14 @ 2:30pm...td

## 2013-08-11 ENCOUNTER — Telehealth (INDEPENDENT_AMBULATORY_CARE_PROVIDER_SITE_OTHER): Payer: Self-pay | Admitting: General Surgery

## 2013-08-11 NOTE — Telephone Encounter (Signed)
My office is packed that day.  If they wanted them out 1/7 we should do that day or sometime later this week.

## 2013-08-11 NOTE — Telephone Encounter (Signed)
Pt of Dr. Donne Hazel called to report she had emergency surgery in [facility], for a bowel resection.  Apparently adhesions caused a portion of her bowel to become gangrene.  She has come home now and was instructed by her [facility] surgeon to get her staples removed on 08/16/13.  He is Dr. Cy Blamer, [address], [place], [place], [address].  Phone:  [phone].  Appt made for her to see Dr. Donne Hazel on Monday, 08/14/13 and will call for her records.  Called Dr. Lorie Apley office and they will FAX notes.

## 2013-08-14 ENCOUNTER — Encounter (INDEPENDENT_AMBULATORY_CARE_PROVIDER_SITE_OTHER): Payer: Medicare PPO | Admitting: General Surgery

## 2013-08-16 ENCOUNTER — Telehealth: Payer: Self-pay | Admitting: Internal Medicine

## 2013-08-16 ENCOUNTER — Encounter: Payer: Self-pay | Admitting: Internal Medicine

## 2013-08-16 ENCOUNTER — Other Ambulatory Visit (INDEPENDENT_AMBULATORY_CARE_PROVIDER_SITE_OTHER): Payer: Medicare HMO

## 2013-08-16 ENCOUNTER — Ambulatory Visit (INDEPENDENT_AMBULATORY_CARE_PROVIDER_SITE_OTHER): Payer: Medicare HMO | Admitting: Internal Medicine

## 2013-08-16 VITALS — BP 160/70 | HR 64 | Temp 98.0°F | Wt 142.2 lb

## 2013-08-16 DIAGNOSIS — I10 Essential (primary) hypertension: Secondary | ICD-10-CM

## 2013-08-16 DIAGNOSIS — N12 Tubulo-interstitial nephritis, not specified as acute or chronic: Secondary | ICD-10-CM

## 2013-08-16 DIAGNOSIS — R109 Unspecified abdominal pain: Secondary | ICD-10-CM

## 2013-08-16 DIAGNOSIS — K55059 Acute (reversible) ischemia of intestine, part and extent unspecified: Secondary | ICD-10-CM | POA: Insufficient documentation

## 2013-08-16 DIAGNOSIS — E109 Type 1 diabetes mellitus without complications: Secondary | ICD-10-CM

## 2013-08-16 LAB — CBC WITH DIFFERENTIAL/PLATELET
BASOS ABS: 0 10*3/uL (ref 0.0–0.1)
Basophils Relative: 0.4 % (ref 0.0–3.0)
EOS PCT: 2.3 % (ref 0.0–5.0)
Eosinophils Absolute: 0.2 10*3/uL (ref 0.0–0.7)
HEMATOCRIT: 34.9 % — AB (ref 36.0–46.0)
Hemoglobin: 11.9 g/dL — ABNORMAL LOW (ref 12.0–15.0)
LYMPHS ABS: 1.2 10*3/uL (ref 0.7–4.0)
Lymphocytes Relative: 18 % (ref 12.0–46.0)
MCHC: 34.1 g/dL (ref 30.0–36.0)
MCV: 92 fl (ref 78.0–100.0)
MONO ABS: 0.7 10*3/uL (ref 0.1–1.0)
Monocytes Relative: 10.3 % (ref 3.0–12.0)
NEUTROS PCT: 69 % (ref 43.0–77.0)
Neutro Abs: 4.6 10*3/uL (ref 1.4–7.7)
PLATELETS: 433 10*3/uL — AB (ref 150.0–400.0)
RBC: 3.8 Mil/uL — ABNORMAL LOW (ref 3.87–5.11)
RDW: 13.1 % (ref 11.5–14.6)
WBC: 6.7 10*3/uL (ref 4.5–10.5)

## 2013-08-16 LAB — BASIC METABOLIC PANEL
BUN: 7 mg/dL (ref 6–23)
CALCIUM: 9.3 mg/dL (ref 8.4–10.5)
CO2: 25 mEq/L (ref 19–32)
Chloride: 97 mEq/L (ref 96–112)
Creatinine, Ser: 0.6 mg/dL (ref 0.4–1.2)
GFR: 97.9 mL/min (ref >60.00)
GLUCOSE: 243 mg/dL — AB (ref 70–99)
POTASSIUM: 4.5 meq/L (ref 3.5–5.1)
SODIUM: 130 meq/L — AB (ref 135–145)

## 2013-08-16 LAB — URINALYSIS, ROUTINE W REFLEX MICROSCOPIC
Bilirubin Urine: NEGATIVE
KETONES UR: NEGATIVE
NITRITE: POSITIVE — AB
PH: 6.5 (ref 5.0–8.0)
Total Protein, Urine: NEGATIVE
UROBILINOGEN UA: 0.2 (ref 0.0–1.0)
Urine Glucose: NEGATIVE

## 2013-08-16 LAB — HEMOGLOBIN A1C: Hgb A1c MFr Bld: 7.4 % — ABNORMAL HIGH (ref 4.6–6.5)

## 2013-08-16 MED ORDER — ALPRAZOLAM 0.5 MG PO TABS: 0.5000 mg | ORAL_TABLET | Freq: Every day | ORAL | Status: DC | PRN

## 2013-08-16 MED ORDER — CIPROFLOXACIN HCL 250 MG PO TABS: 250.0000 mg | ORAL_TABLET | Freq: Two times a day (BID) | ORAL | Status: DC

## 2013-08-16 NOTE — Assessment & Plan Note (Signed)
Presentation with bowel obstruction at [facility] 08/02/2013 6 inches small bowel resection related to same per patient report Continued right abdomen and flank pain, but tolerating oral medications without problem Followup with local surgeon in 48 hours as planned for staple removal Check labs including CBC, basic metabolic and urinalysis to further evaluate flank pain Consider repeat CT to reevaluate depending on labs and severity of pain Continue ongoing pain medications as prescribed

## 2013-08-16 NOTE — Progress Notes (Addendum)
Subjective:    Patient ID: [name], female    DOB: 27-Oct-1928, 80 y.o.   MRN: [id number]  HPI Here for hospital follow up - no DC summary available at this time for review, but pt with DC instructions: acute mesenteric ischemia with bowel resection December 23 at [facility]  Scheduled to see general surgeon locally tomorrow for staple removal  Reports sensation of bowel urgency with diarrhea this morning Also increasing right flank and right side pain ( location of ischemic bowel removal per patient) Suprapubic cramping, relieved with urination, but denies dysuria  Denies fever, vomiting, constipation -tolerating regular diabetic diet without complication  Concerned about holding diuretic and would like to resume same as soon as possible  also reviewed chronic medical issues:  DM1 - on insulin pump since 2007 - follows with endo at [facility] - denies complications with neuropathy, follows also optho - denies hypoglycemia  Beast cancer - dx 08/2011 s/p resection - intol of tamoxifen, Poor tol XRT 03/2012 with syncope event (no cardiac abnormality identified) - working with med onc on same  Dyslipidemia -on simva since 2003 - the patient reports compliance with medication(s) as prescribed. Denies adverse side effects.  Hypothyroid - the patient reports compliance with medication(s) as prescribed. Denies adverse side effects.  Past Medical History  Diagnosis Date  . Hyponatremia   . Hypothyroidism   . Diabetes mellitus     type wears insulin pump  . GERD (gastroesophageal reflux disease)   . Arthritis     fingers  . Hypothyroid   . Hypercholesteremia   . Hypertension   . Diabetes mellitus type 1     type I- wears insulin pump  . Osteoarthritis   . PAF (paroxysmal atrial fibrillation)     One episode in 2008, spontaneously converted in the ED, no further treatment  . Thyroid nodule dx 07/2011    [place] q 22mo to follow calcified L thyroid nodule (12/08/11 [place])  . Osteoporosis,  post-menopausal     DEXA 12/02/11: -3.5 (with lomax gyn), declines bisphos due to bone pain  . Spondylosis     thoracic spine  . Cancer of upper-outer quadrant of female breast 08/21/2011    ER +  PR +  Her 2 -  Ki67 9%   0.9 cm invasive lobular  Carcinoma ,s/p central lumpectomy with sentinel node biopsy,  ER/PR positive s/p re-excion on 09/16/11 with final pathology showing atypical hyperplasia    Review of Systems  Constitutional: Positive for fatigue. Negative for fever, appetite change and unexpected weight change.  Respiratory: Negative for cough and shortness of breath.   Gastrointestinal: Positive for diarrhea and constipation. Negative for nausea and abdominal pain.  Genitourinary: Positive for flank pain (right) and decreased urine volume.       Objective:   Physical Exam BP 160/70  Pulse 64  Temp(Src) 98 F (36.7 C) (Oral)  Wt 142 lb 4 oz (64.524 kg) Wt Readings from Last 3 Encounters:  08/16/13 142 lb 4 oz (64.524 kg)  07/17/13 148 lb 6.4 oz (67.314 kg)  02/15/13 143 lb 6.4 oz (65.046 kg)   Constitutional: She appears fatigue, but well-developed and well-nourished. No distress.  Neck: Normal range of motion. Neck supple. No JVD present. No thyromegaly present.  Cardiovascular: Normal rate, regular rhythm and normal heart sounds.  No murmur heard. No BLE edema. Pulmonary/Chest: Effort normal and breath sounds normal. No respiratory distress. She has no wheezes.  Abdomen: laparoscopic incision well healed, staples intact without erythema  or signs of infection. Marked tenderness with minimal palpation over right side of abdomen and flank Skin: Skin is warm and dry. No rash noted. No erythema.  Psychiatric: She has a normal mood and affect. Her behavior is normal. Judgment and thought content normal.   Lab Results  Component Value Date   WBC 6.8 02/15/2013   HGB 13.8 02/15/2013   HCT 40.3 02/15/2013   PLT 198.0 02/15/2013   GLUCOSE 127* 09/28/2012   CHOL 220* 11/23/2012   TRIG  160 11/23/2012   HDL 60 11/23/2012   LDLCALC 102 11/23/2012   ALT 22 02/15/2013   AST 32 02/15/2013   NA 133* 09/28/2012   K 3.5 09/28/2012   CL 98 09/28/2012   CREATININE 0.8 09/28/2012   BUN 11.4 09/28/2012   CO2 27 09/28/2012   TSH 1.88 07/13/2012   INR 1.16 03/18/2012   HGBA1C 7.0* 07/13/2012      Assessment & Plan:  See problem list. Medications and labs reviewed today.  Addendum - UA with pyelo - erx for cipro done - pt to call if R flank pain unimproved or if unable to tol po with tx

## 2013-08-16 NOTE — Addendum Note (Signed)
Addended by: Gwendolyn Grant A on: 08/16/2013 05:15 PM   Modules accepted: Orders

## 2013-08-16 NOTE — Telephone Encounter (Signed)
08/16/2013  Pt came back in to office after going to pharmacy to pick up rx ALPRAZolam (XANAX) 0.5 MG.  Pt thought they rx was going to be called in and not a hard copy.  Pt said they will wait in the lobby for hardcopy.  Please advise.

## 2013-08-16 NOTE — Patient Instructions (Signed)
It was good to see you today.  We have reviewed your prior records including labs and tests today  Test(s) ordered today. Your results will be released to [place] (or called to you) after review, usually within 72hours after test completion. If any changes need to be made, you will be notified at that same time.  Medications reviewed and updated, continue to hold diuretic at this time and refill on Xanax provided as requested -no changes recommended at this time.  Depending on results of urine tests, labs and severity of her pain, we may consider other testing as needed -your be contacted about these results after review  Followup with Dr. Donne Hazel as planned on Friday for staple removal  if your symptoms continue to worsen (pain, fever, etc), or if you are unable take anything by mouth (pills, fluids, etc), you should go to the emergency room for further evaluation and treatment.

## 2013-08-16 NOTE — Progress Notes (Signed)
Pre-visit discussion using our clinic review tool. No additional management support is needed unless otherwise documented below in the visit note.  

## 2013-08-16 NOTE — Assessment & Plan Note (Signed)
BP Readings from Last 3 Encounters:  08/16/13 160/70  07/17/13 130/80  02/15/13 144/68   Currently exacerbated by pain Holding diuretics for last 2 weeks due to dehydration and acute renal insufficiency in the setting of acute mesenteric ischemia and small bowel resection 07/2013 Recheck electrolytes and renal function -resume HCTZ as needed

## 2013-08-16 NOTE — Assessment & Plan Note (Signed)
On insulin pump since 2007 - follows with endo to manage same (but notes difficult drive to [facility] at her age) Reviewed settings, works with "pump nurse" q26mo for review Denies complications - sees optho annually On ASA, statin  Lab Results  Component Value Date   HGBA1C 7.0* 07/13/2012

## 2013-08-18 ENCOUNTER — Encounter (INDEPENDENT_AMBULATORY_CARE_PROVIDER_SITE_OTHER): Payer: Self-pay | Admitting: General Surgery

## 2013-08-18 ENCOUNTER — Encounter: Payer: Self-pay | Admitting: Internal Medicine

## 2013-08-18 ENCOUNTER — Ambulatory Visit (INDEPENDENT_AMBULATORY_CARE_PROVIDER_SITE_OTHER): Payer: Medicare PPO | Admitting: General Surgery

## 2013-08-18 VITALS — BP 150/80 | HR 80 | Temp 98.6°F | Resp 14 | Ht 67.0 in | Wt 140.6 lb

## 2013-08-18 DIAGNOSIS — Z09 Encounter for follow-up examination after completed treatment for conditions other than malignant neoplasm: Secondary | ICD-10-CM

## 2013-08-18 DIAGNOSIS — R109 Unspecified abdominal pain: Secondary | ICD-10-CM

## 2013-08-18 LAB — BASIC METABOLIC PANEL
BUN: 11 mg/dL (ref 6–23)
CALCIUM: 9.4 mg/dL (ref 8.4–10.5)
CO2: 24 meq/L (ref 19–32)
CREATININE: 0.59 mg/dL (ref 0.50–1.10)
Chloride: 97 mEq/L (ref 96–112)
GLUCOSE: 163 mg/dL — AB (ref 70–99)
Potassium: 4.3 mEq/L (ref 3.5–5.3)
SODIUM: 130 meq/L — AB (ref 135–145)

## 2013-08-20 NOTE — Progress Notes (Signed)
Subjective:     Patient ID: [name], female   DOB: May 18, 1929, 80 y.o.   MRN: [id number]  HPI 61 yof breast cancer patient of mine who was on vacation [place] and was admitted with a small bowel obstruction.  She was taken to the OR on 12/24 for loa with sbr.  She recovered well and has now returned home.  I have her operative report and discharge summary from this admission.  She is doing pretty well.  She had uti at pcp yesterday and has right flank pain.  She is eating well and appetite is improving.  She is having bowel movements and passing flatus. She comes in for follow up and staple removal  Review of Systems     Objective:   Physical Exam Abdomen soft, nontender, incisions clean without infection, bs present Right flank pain    Assessment:     S/p loa/sbr Right flank pain    Plan:     I removed staples and placed steristrips.  I did write for ct a/p to rule out pyelo given ua and pain.  Will follow up in a couple weeks and will review ct when done.

## 2013-08-22 ENCOUNTER — Ambulatory Visit
Admission: RE | Admit: 2013-08-22 | Discharge: 2013-08-22 | Disposition: A | Discharge status: Home / Self Care | Payer: Medicare HMO | Source: Ambulatory Visit | Attending: General Surgery | Admitting: General Surgery

## 2013-08-22 DIAGNOSIS — R109 Unspecified abdominal pain: Secondary | ICD-10-CM

## 2013-08-22 MED ORDER — IOHEXOL 300 MG/ML  SOLN
100.0000 mL | Freq: Once | INTRAMUSCULAR | Status: AC | PRN
  Administered 2013-08-22: 100 mL via INTRAVENOUS

## 2013-08-23 ENCOUNTER — Telehealth: Payer: Self-pay

## 2013-08-23 ENCOUNTER — Telehealth (INDEPENDENT_AMBULATORY_CARE_PROVIDER_SITE_OTHER): Payer: Self-pay

## 2013-08-23 NOTE — Telephone Encounter (Signed)
Called pt to check on her after having the CT scan. The pt is doing ok today. The pt had some diarrhea yesterday but relates it to the prep. The pt has had some gas pains but up walking around today to move the gas. The pt does not have any fever or chills. The pt has a little appetite. I made the pt an appt to see Dr Donne Hazel next week but if the pt continues to have diarrhea or worse pains she is to call us back before the appt. The pt understands.

## 2013-08-23 NOTE — Telephone Encounter (Signed)
The patient called and wanted to report that she was now experiencing pain when she urinates.  She stated she was told to call with any changes.   Callback [phone]

## 2013-08-24 ENCOUNTER — Encounter: Payer: Self-pay | Admitting: Internal Medicine

## 2013-08-24 NOTE — Telephone Encounter (Signed)
Noted - also I reviewed her CT 1/13 symptoms likely due to known bladder infection - on tx for same continue Cipro as rx'd, no new med changes recommended  follow up Dr Donne Hazel as planned Continue hydration

## 2013-08-24 NOTE — Telephone Encounter (Signed)
Notified pt with md response.../lmb 

## 2013-09-01 ENCOUNTER — Encounter (INDEPENDENT_AMBULATORY_CARE_PROVIDER_SITE_OTHER): Payer: Self-pay

## 2013-09-01 ENCOUNTER — Ambulatory Visit (INDEPENDENT_AMBULATORY_CARE_PROVIDER_SITE_OTHER): Payer: Medicare PPO | Admitting: General Surgery

## 2013-09-01 ENCOUNTER — Encounter (INDEPENDENT_AMBULATORY_CARE_PROVIDER_SITE_OTHER): Payer: Self-pay | Admitting: General Surgery

## 2013-09-01 VITALS — BP 140/72 | HR 80 | Temp 99.0°F | Resp 18 | Ht 67.0 in | Wt 140.0 lb

## 2013-09-01 DIAGNOSIS — Z09 Encounter for follow-up examination after completed treatment for conditions other than malignant neoplasm: Secondary | ICD-10-CM

## 2013-09-01 DIAGNOSIS — K529 Noninfective gastroenteritis and colitis, unspecified: Secondary | ICD-10-CM

## 2013-09-01 DIAGNOSIS — K5289 Other specified noninfective gastroenteritis and colitis: Secondary | ICD-10-CM

## 2013-09-01 MED ORDER — CIPROFLOXACIN HCL 250 MG PO TABS: 250.0000 mg | ORAL_TABLET | Freq: Two times a day (BID) | ORAL | Status: DC

## 2013-09-01 MED ORDER — METRONIDAZOLE 250 MG PO TABS: 250.0000 mg | ORAL_TABLET | Freq: Two times a day (BID) | ORAL | Status: AC

## 2013-09-01 NOTE — Patient Instructions (Signed)
Take miralax daily and get a probiotic (align for example)

## 2013-09-04 ENCOUNTER — Telehealth (INDEPENDENT_AMBULATORY_CARE_PROVIDER_SITE_OTHER): Payer: Self-pay | Admitting: General Surgery

## 2013-09-04 NOTE — Telephone Encounter (Signed)
Pt called to report she cannot continue taking the Cipro and Flagyl, secondary to severe nausea and light-headedness.  Her pharmacist advised her to stop taking it, so she did this (yesterday.)  She is calling to see if Dr. Donne Hazel wants her to start a different medication instead.  Please advise.

## 2013-09-04 NOTE — Telephone Encounter (Signed)
She should probably be ok without .  I was treated her for what might be colitis although it may very well just be postop.  As long as she doesn't have diarrhea or a fever she can go without

## 2013-09-04 NOTE — Progress Notes (Signed)
Subjective:     Patient ID: [name], female   DOB: 1929/02/01, 80 y.o.   MRN: [id number]  HPI 22 yof breast cancer patient of mine who was on vacation [place] and was admitted with a small bowel obstruction. She was taken to the OR on 12/24 for loa with sbr. She recovered well and has now returned home.I saw her last week and removed staples.  She is eating and passing flatus, She is having some hard stools right now.  No n/v, no fevers.  She has some right flank pain still.  Was being treated for presumed pyelo.  I got ct to make sure no abscess which was the case.  She has some thickening which may be postop that was called possible colitis.  Her pain is unchanged today and returns for f/u.   Review of Systems     Objective:   Physical Exam Abdomen soft, nontender, incision clean without infection    Assessment:     S/p loa/sbr     Plan:         I think overall this is most likely postoperative in nature. I did discuss with her taking a short course of antibiotics just in case she has some colitis associated with this. There is no other abnormality I can identify and I think most likely she will just eventually recover from that emergency surgery. I will plan on seeing her back in a week. We discussed increasing her intake and including some protein shakes. We also discussed using some MiraLAX as well as increasing her water intake.

## 2013-09-04 NOTE — Telephone Encounter (Signed)
Returned pt's call. I advised pt that she will be ok without the abx's per Dr Donne Hazel as long as she doesn't have diarrhea or fevers. The pt understands. The pt will see [place] on 09/08/13.

## 2013-09-08 ENCOUNTER — Ambulatory Visit (INDEPENDENT_AMBULATORY_CARE_PROVIDER_SITE_OTHER): Payer: Medicare PPO | Admitting: General Surgery

## 2013-09-08 ENCOUNTER — Encounter (INDEPENDENT_AMBULATORY_CARE_PROVIDER_SITE_OTHER): Payer: Self-pay | Admitting: General Surgery

## 2013-09-08 ENCOUNTER — Encounter (INDEPENDENT_AMBULATORY_CARE_PROVIDER_SITE_OTHER): Payer: Medicare PPO | Admitting: General Surgery

## 2013-09-08 VITALS — BP 150/72 | HR 76 | Temp 97.9°F | Resp 14 | Ht 67.0 in | Wt 138.2 lb

## 2013-09-08 DIAGNOSIS — K529 Noninfective gastroenteritis and colitis, unspecified: Secondary | ICD-10-CM

## 2013-09-08 DIAGNOSIS — K5289 Other specified noninfective gastroenteritis and colitis: Secondary | ICD-10-CM

## 2013-09-08 LAB — BASIC METABOLIC PANEL
BUN: 15 mg/dL (ref 6–23)
CO2: 30 mEq/L (ref 19–32)
CREATININE: 0.61 mg/dL (ref 0.50–1.10)
Calcium: 9.5 mg/dL (ref 8.4–10.5)
Chloride: 96 mEq/L (ref 96–112)
Glucose, Bld: 140 mg/dL — ABNORMAL HIGH (ref 70–99)
POTASSIUM: 4.8 meq/L (ref 3.5–5.3)
Sodium: 130 mEq/L — ABNORMAL LOW (ref 135–145)

## 2013-09-08 LAB — CBC WITH DIFFERENTIAL/PLATELET
BASOS ABS: 0 10*3/uL (ref 0.0–0.1)
BASOS PCT: 1 % (ref 0–1)
Eosinophils Absolute: 0.1 10*3/uL (ref 0.0–0.7)
Eosinophils Relative: 2 % (ref 0–5)
HEMATOCRIT: 36.3 % (ref 36.0–46.0)
Hemoglobin: 12.6 g/dL (ref 12.0–15.0)
LYMPHS PCT: 24 % (ref 12–46)
Lymphs Abs: 1.4 10*3/uL (ref 0.7–4.0)
MCH: 31.3 pg (ref 26.0–34.0)
MCHC: 34.7 g/dL (ref 30.0–36.0)
MCV: 90.1 fL (ref 78.0–100.0)
MONO ABS: 0.7 10*3/uL (ref 0.1–1.0)
Monocytes Relative: 12 % (ref 3–12)
NEUTROS ABS: 3.7 10*3/uL (ref 1.7–7.7)
Neutrophils Relative %: 61 % (ref 43–77)
PLATELETS: 216 10*3/uL (ref 150–400)
RBC: 4.03 MIL/uL (ref 3.87–5.11)
RDW: 13.1 % (ref 11.5–15.5)
WBC: 6 10*3/uL (ref 4.0–10.5)

## 2013-09-08 MED ORDER — CIPROFLOXACIN HCL 250 MG PO TABS: 250.0000 mg | ORAL_TABLET | Freq: Two times a day (BID) | ORAL | Status: DC

## 2013-09-08 MED ORDER — ZOLPIDEM TARTRATE 5 MG PO TABS: 5.0000 mg | ORAL_TABLET | Freq: Every evening | ORAL | Status: DC | PRN

## 2013-09-08 NOTE — Progress Notes (Signed)
Subjective:     Patient ID: [name], female   DOB: March 16, 1929, 80 y.o.   MRN: [id number]  HPI 80 yof breast cancer patient of mine who was on vacation at [facility] and was admitted with a small bowel obstruction. She was taken to the OR on 12/24 for loa with sbr.  She is eating some and passing flatus, She is having a fair amount of loose stools. No n/v, no fevers. She has some right flank pain still. Was being treated for presumed pyelo. I got ct to make sure no abscess which was the case. She has some thickening which may be postop that was called possible colitis. She comes back in today complaining of the diarrhea.  She could not take flagyl I gave her before and has a number of allergies.  She also has pain when urinating still.   Review of Systems     Objective:   Physical Exam Abdomen soft, nontender, wound clean without infection    Assessment:     Status post small bowel resection Likely colitis     Plan:     I do think she may have colitis and she really can't take many antibiotics. I would try to just treat her with ciprofloxacin and see if this resolves. She will also attempt some probiotics. I did give her something to help her sleep with some Ambien. I will also send her to get a CBC, bmet and a urinalysis today. I will follow up with her after all of those.

## 2013-09-09 LAB — URINALYSIS W MICROSCOPIC + REFLEX CULTURE
Bacteria, UA: NONE SEEN
Bilirubin Urine: NEGATIVE
CRYSTALS: NONE SEEN
Casts: NONE SEEN
GLUCOSE, UA: NEGATIVE mg/dL
Hgb urine dipstick: NEGATIVE
Ketones, ur: NEGATIVE mg/dL
LEUKOCYTES UA: NEGATIVE
Nitrite: NEGATIVE
PROTEIN: NEGATIVE mg/dL
SPECIFIC GRAVITY, URINE: 1.014 (ref 1.005–1.030)
SQUAMOUS EPITHELIAL / LPF: NONE SEEN
UROBILINOGEN UA: 0.2 mg/dL (ref 0.0–1.0)
pH: 6.5 (ref 5.0–8.0)

## 2013-09-11 ENCOUNTER — Telehealth (INDEPENDENT_AMBULATORY_CARE_PROVIDER_SITE_OTHER): Payer: Self-pay

## 2013-09-11 NOTE — Telephone Encounter (Signed)
Called pt to notify her that the labs she got drawn on Friday were good except her sodium was a little low per Dr Donne Hazel. I advised pt that she might need to get labs repeated again at the end of the week. The pt stated that her sodium has been low now for over a year. I advised pt that I would notify Dr Donne Hazel but he still might want her to get labs drawn at the end of the week. The pt understands and I will contact her back.

## 2013-09-12 NOTE — Telephone Encounter (Signed)
She doesn't need repeated then

## 2013-09-12 NOTE — Telephone Encounter (Signed)
Called pt back to let her know that she didn't need to have the labs repeated per Dr Donne Hazel. I made her a f/u appt with Dr Donne Hazel for 09/26/13.

## 2013-09-22 ENCOUNTER — Other Ambulatory Visit: Payer: Self-pay | Admitting: *Deleted

## 2013-09-22 DIAGNOSIS — C50419 Malignant neoplasm of upper-outer quadrant of unspecified female breast: Secondary | ICD-10-CM

## 2013-09-25 ENCOUNTER — Other Ambulatory Visit (HOSPITAL_BASED_OUTPATIENT_CLINIC_OR_DEPARTMENT_OTHER): Payer: Medicare HMO

## 2013-09-25 ENCOUNTER — Telehealth (INDEPENDENT_AMBULATORY_CARE_PROVIDER_SITE_OTHER): Payer: Self-pay

## 2013-09-25 ENCOUNTER — Ambulatory Visit: Payer: Self-pay | Admitting: Oncology

## 2013-09-25 DIAGNOSIS — C50419 Malignant neoplasm of upper-outer quadrant of unspecified female breast: Secondary | ICD-10-CM

## 2013-09-25 LAB — CBC WITH DIFFERENTIAL/PLATELET
BASO%: 0.6 % (ref 0.0–2.0)
BASOS ABS: 0 10*3/uL (ref 0.0–0.1)
EOS ABS: 0.1 10*3/uL (ref 0.0–0.5)
EOS%: 2.1 % (ref 0.0–7.0)
HEMATOCRIT: 37.3 % (ref 34.8–46.6)
HEMOGLOBIN: 12.8 g/dL (ref 11.6–15.9)
LYMPH%: 29.1 % (ref 14.0–49.7)
MCH: 31.4 pg (ref 25.1–34.0)
MCHC: 34.3 g/dL (ref 31.5–36.0)
MCV: 91.5 fL (ref 79.5–101.0)
MONO#: 0.8 10*3/uL (ref 0.1–0.9)
MONO%: 15 % — AB (ref 0.0–14.0)
NEUT%: 53.2 % (ref 38.4–76.8)
NEUTROS ABS: 2.9 10*3/uL (ref 1.5–6.5)
PLATELETS: 197 10*3/uL (ref 145–400)
RBC: 4.08 10*6/uL (ref 3.70–5.45)
RDW: 12.7 % (ref 11.2–14.5)
WBC: 5.5 10*3/uL (ref 3.9–10.3)
lymph#: 1.6 10*3/uL (ref 0.9–3.3)

## 2013-09-25 LAB — COMPREHENSIVE METABOLIC PANEL (CC13)
ALBUMIN: 3.9 g/dL (ref 3.5–5.0)
ALK PHOS: 104 U/L (ref 40–150)
ALT: 18 U/L (ref 0–55)
AST: 25 U/L (ref 5–34)
Anion Gap: 8 mEq/L (ref 3–11)
BILIRUBIN TOTAL: 0.49 mg/dL (ref 0.20–1.20)
BUN: 13.3 mg/dL (ref 7.0–26.0)
CO2: 25 mEq/L (ref 22–29)
CREATININE: 0.8 mg/dL (ref 0.6–1.1)
Calcium: 10.1 mg/dL (ref 8.4–10.4)
Chloride: 97 mEq/L — ABNORMAL LOW (ref 98–109)
GLUCOSE: 325 mg/dL — AB (ref 70–140)
POTASSIUM: 4.3 meq/L (ref 3.5–5.1)
Sodium: 129 mEq/L — ABNORMAL LOW (ref 136–145)
Total Protein: 7.3 g/dL (ref 6.4–8.3)

## 2013-09-25 NOTE — Telephone Encounter (Signed)
Called pt to see about r/s her appt from 2/17 to 2/24 as long as she is doing ok and she said that would be fine. I r/s the appt to 2/24 arrive at 4:30/4:45.

## 2013-09-26 ENCOUNTER — Encounter (INDEPENDENT_AMBULATORY_CARE_PROVIDER_SITE_OTHER): Payer: Medicare PPO | Admitting: General Surgery

## 2013-09-27 ENCOUNTER — Other Ambulatory Visit: Payer: Medicare HMO

## 2013-10-03 ENCOUNTER — Ambulatory Visit (INDEPENDENT_AMBULATORY_CARE_PROVIDER_SITE_OTHER): Payer: Medicare PPO | Admitting: General Surgery

## 2013-10-03 ENCOUNTER — Encounter (INDEPENDENT_AMBULATORY_CARE_PROVIDER_SITE_OTHER): Payer: Self-pay | Admitting: General Surgery

## 2013-10-03 ENCOUNTER — Encounter (INDEPENDENT_AMBULATORY_CARE_PROVIDER_SITE_OTHER): Payer: Medicare PPO | Admitting: General Surgery

## 2013-10-03 VITALS — BP 142/78 | HR 64 | Resp 14 | Ht 67.0 in | Wt 140.8 lb

## 2013-10-03 DIAGNOSIS — K5289 Other specified noninfective gastroenteritis and colitis: Secondary | ICD-10-CM

## 2013-10-03 DIAGNOSIS — K529 Noninfective gastroenteritis and colitis, unspecified: Secondary | ICD-10-CM

## 2013-10-03 NOTE — Progress Notes (Addendum)
Subjective:     Patient ID: [name], female   DOB: 11-13-1928, 80 y.o.   MRN: [id number]  HPI 54 yof breast cancer patient of mine who was on vacation at [facility] and was admitted with a small bowel obstruction. She was taken to the OR on 12/24 for loa with sbr. She is eating some and passing flatus. No n/v, no fevers.  She has some thickening which may be postop that was called possible colitis. All of her issues are basically fine.  She is having stools and tolerating her diet. She feels like she is almost back to her normal self.  Review of Systems     Objective:   Physical Exam Abdomen soft, nontender wound now healed without infection    Assessment:     S/p small bowel resection     Plan:     She is doing well and returning to her normal self.  She can return to her normal activity.  I will see her back for breast cancer follow up in December and she will call back if she needs anything sooner for her abdominal surgery.   Her Na has been low chronically and she needs to follow up with Dr Asa Lente again.

## 2013-10-18 ENCOUNTER — Ambulatory Visit
Admission: RE | Admit: 2013-10-18 | Discharge: 2013-10-18 | Disposition: A | Discharge status: Home / Self Care | Payer: Medicare HMO | Source: Ambulatory Visit | Attending: General Surgery | Admitting: General Surgery

## 2013-10-18 DIAGNOSIS — Z853 Personal history of malignant neoplasm of breast: Secondary | ICD-10-CM

## 2013-11-13 ENCOUNTER — Ambulatory Visit (INDEPENDENT_AMBULATORY_CARE_PROVIDER_SITE_OTHER): Payer: Medicare HMO | Admitting: Internal Medicine

## 2013-11-13 ENCOUNTER — Encounter: Payer: Self-pay | Admitting: Internal Medicine

## 2013-11-13 ENCOUNTER — Encounter: Payer: Self-pay | Admitting: *Deleted

## 2013-11-13 VITALS — BP 148/82 | HR 79 | Temp 98.3°F | Wt 142.4 lb

## 2013-11-13 DIAGNOSIS — E871 Hypo-osmolality and hyponatremia: Secondary | ICD-10-CM

## 2013-11-13 DIAGNOSIS — I10 Essential (primary) hypertension: Secondary | ICD-10-CM

## 2013-11-13 MED ORDER — LOSARTAN POTASSIUM 50 MG PO TABS: 50.0000 mg | ORAL_TABLET | Freq: Every day | ORAL | Status: DC

## 2013-11-13 NOTE — Patient Instructions (Signed)
It was good to see you today.  We have reviewed your prior records including labs and tests today  Test(s) ordered today - to recheck BMET in 2 weeks - Your results will be released to [place] (or called to you) after review, usually within 72hours after test completion. If any changes need to be made, you will be notified at that same time.  Medications reviewed and updated stop hydrochlorothiazide -start losartan once daily No other changes recommended at this time. Your prescription(s) have been submitted to your pharmacy. Please take as directed and contact our office if you believe you are having problem(s) with the medication(s).  we'll make referral for head ct. Our office will contact you regarding appointment(s) once made.  Please schedule followup in 3-4 months for blood pressure recheck, call sooner if problems.

## 2013-11-13 NOTE — Progress Notes (Signed)
Pre visit review using our clinic review tool, if applicable. No additional management support is needed unless otherwise documented below in the visit note. 

## 2013-11-13 NOTE — Assessment & Plan Note (Signed)
Chronic, may be exac by HCTZ so will DC same See HTN re: ARB trial Chronic HA so will check CT head given chronic hyponatremia No other offending meds or other dx to clearly explain same

## 2013-11-13 NOTE — Progress Notes (Signed)
Subjective:    Patient ID: [name], female    DOB: [birth year], 80 y.o.   MRN: [id number]  HPI  Patient here today for follow-up.  Chronic medical history and intercurrent events reviewed.  Referred back for evaluation by Dr Donne Hazel for evaluation of hyponatremia.    She endorses symptoms of headache, intermittent nausea and dry mouth.  Denies confusion or recent falls.  She is following a low sodium diet for blood pressure.  Denies orthostatic symptoms.   HTN - maintained on HCTZ - reports compliance with current therapy.  Denies chest pain, shortness of breath.  Does report nocturnal palpitations with HR in the 80's - regular rhythm when she checks her pulse.  She has been on amlodipine in the past but discontinued due to LE edema.   Past Medical History  Diagnosis Date  . Hyponatremia   . Hypothyroidism   . Diabetes mellitus     type wears insulin pump  . GERD (gastroesophageal reflux disease)   . Arthritis     fingers  . Hypothyroid   . Hypercholesteremia   . Hypertension   . Diabetes mellitus type 1     type I- wears insulin pump  . Osteoarthritis   . PAF (paroxysmal atrial fibrillation)     One episode in 2008, spontaneously converted in the ED, no further treatment  . Thyroid nodule dx 07/2011    [place] q 66mo to follow calcified L thyroid nodule (12/08/11 [place])  . Osteoporosis, post-menopausal     DEXA 12/02/11: -3.5 (with lomax gyn), declines bisphos due to bone pain  . Spondylosis     thoracic spine  . Cancer of upper-outer quadrant of female breast 08/21/2011    ER +  PR +  Her 2 -  Ki67 9%   0.9 cm invasive lobular  Carcinoma ,s/p central lumpectomy with sentinel node biopsy,  ER/PR positive s/p re-excion on 09/16/11 with final pathology showing atypical hyperplasia      Review of Systems  Constitutional: Negative for fever, chills, activity change, appetite change and fatigue.  Respiratory: Negative for cough, chest tightness, shortness of breath and wheezing.     Cardiovascular: Positive for palpitations (nocturnal - see HPI). Negative for chest pain and leg swelling.  Gastrointestinal: Positive for nausea (occasional). Negative for vomiting, diarrhea and constipation.  Musculoskeletal:       Occasional muscle cramps in bilateral upper extremities  Neurological: Positive for headaches. Negative for dizziness, syncope, weakness and numbness.  Psychiatric/Behavioral: Negative for confusion. The patient is not nervous/anxious.        Objective:   Physical Exam  Constitutional: She is oriented to person, place, and time. She appears well-developed and well-nourished. No distress.  Neck: Normal range of motion. Neck supple. No thyromegaly present.  Cardiovascular: Normal rate and normal heart sounds.   No murmur heard. Pulmonary/Chest: Effort normal and breath sounds normal. No respiratory distress. She has no wheezes.  Abdominal: Soft. Bowel sounds are normal. She exhibits no distension. There is no tenderness.  Musculoskeletal: Normal range of motion. She exhibits no edema and no tenderness.  Lymphadenopathy:    She has no cervical adenopathy.  Neurological: She is alert and oriented to person, place, and time.  Skin: Skin is warm and dry. She is not diaphoretic.  Psychiatric: She has a normal mood and affect. Her behavior is normal. Judgment and thought content normal.    BP Readings from Last 3 Encounters:  11/13/13 148/82  10/03/13 142/78  09/08/13 150/72  Wt Readings from Last 3 Encounters:  11/13/13 142 lb 6.4 oz (64.592 kg)  10/03/13 140 lb 12.8 oz (63.866 kg)  09/08/13 138 lb 3.2 oz (62.687 kg)   Lab Results  Component Value Date   WBC 5.5 09/25/2013   HGB 12.8 09/25/2013   HCT 37.3 09/25/2013   PLT 197 09/25/2013   GLUCOSE 325* 09/25/2013   CHOL 220* 11/23/2012   TRIG 160 11/23/2012   HDL 60 11/23/2012   LDLCALC 102 11/23/2012   ALT 18 09/25/2013   AST 25 09/25/2013   NA 129* 09/25/2013   K 4.3 09/25/2013   CL 96 09/08/2013    CREATININE 0.8 09/25/2013   BUN 13.3 09/25/2013   CO2 25 09/25/2013   TSH 1.88 07/13/2012   INR 1.16 03/18/2012   HGBA1C 7.4* 08/16/2013       Assessment & Plan:   Problem List Items Addressed This Visit   Hypertension      BP Readings from Last 3 Encounters:  11/13/13 148/82  10/03/13 142/78  09/08/13 150/72   DC HCTZ now and begin RB - losartan $RemoveBe'50mg'TAyuEruQU$  qd we reviewed potential risk/benefit and possible side effects - pt understands and agrees to same       Relevant Medications      losartan (COZAAR) tablet   Other Relevant Orders      CT Head Wo Contrast      Basic metabolic panel   Hyponatremia - Primary     Chronic, may be exac by HCTZ so will DC same See HTN re: ARB trial Chronic HA so will check CT head given chronic hyponatremia No other offending meds or other dx to clearly explain same    Relevant Orders      CT Head Wo Contrast      Basic metabolic panel

## 2013-11-13 NOTE — Assessment & Plan Note (Signed)
BP Readings from Last 3 Encounters:  11/13/13 148/82  10/03/13 142/78  09/08/13 150/72   DC HCTZ now and begin RB - losartan 50mg  qd we reviewed potential risk/benefit and possible side effects - pt understands and agrees to same

## 2013-11-14 ENCOUNTER — Telehealth: Payer: Self-pay | Admitting: Internal Medicine

## 2013-11-14 NOTE — Telephone Encounter (Signed)
Relevant patient education assigned to patient using Emmi. ° °

## 2013-11-15 ENCOUNTER — Other Ambulatory Visit: Payer: Medicare HMO

## 2013-11-27 ENCOUNTER — Other Ambulatory Visit (INDEPENDENT_AMBULATORY_CARE_PROVIDER_SITE_OTHER): Payer: Medicare HMO

## 2013-11-27 DIAGNOSIS — I10 Essential (primary) hypertension: Secondary | ICD-10-CM

## 2013-11-27 DIAGNOSIS — E871 Hypo-osmolality and hyponatremia: Secondary | ICD-10-CM

## 2013-11-27 LAB — BASIC METABOLIC PANEL
BUN: 13 mg/dL (ref 6–23)
CHLORIDE: 98 meq/L (ref 96–112)
CO2: 26 mEq/L (ref 19–32)
CREATININE: 0.7 mg/dL (ref 0.4–1.2)
Calcium: 9.5 mg/dL (ref 8.4–10.5)
GFR: 92.64 mL/min (ref >60.00)
Glucose, Bld: 152 mg/dL — ABNORMAL HIGH (ref 70–99)
Potassium: 4.3 mEq/L (ref 3.5–5.1)
Sodium: 132 mEq/L — ABNORMAL LOW (ref 135–145)

## 2013-12-04 ENCOUNTER — Other Ambulatory Visit: Payer: Medicare HMO

## 2013-12-20 ENCOUNTER — Other Ambulatory Visit: Payer: Medicare HMO

## 2013-12-26 ENCOUNTER — Other Ambulatory Visit: Payer: Medicare HMO

## 2014-01-17 ENCOUNTER — Telehealth: Payer: Self-pay | Admitting: Adult Health

## 2014-01-17 NOTE — Telephone Encounter (Signed)
, °

## 2014-01-23 ENCOUNTER — Ambulatory Visit
Admission: RE | Admit: 2014-01-23 | Discharge: 2014-01-23 | Disposition: A | Discharge status: Home / Self Care | Payer: Medicare HMO | Source: Ambulatory Visit | Attending: Gynecology | Admitting: Gynecology

## 2014-01-23 LAB — HM DEXA SCAN

## 2014-01-24 ENCOUNTER — Ambulatory Visit: Payer: Medicare HMO | Admitting: Oncology

## 2014-02-12 ENCOUNTER — Telehealth: Payer: Self-pay

## 2014-02-12 ENCOUNTER — Ambulatory Visit: Payer: Medicare HMO | Admitting: Internal Medicine

## 2014-02-12 NOTE — Telephone Encounter (Signed)
Left msg with patient husband requesting pt come in earlier on 7/8.  Husband indicates patient not back until tonight.  Advised him I would try again in am.

## 2014-02-13 ENCOUNTER — Telehealth: Payer: Self-pay | Admitting: Adult Health

## 2014-02-13 NOTE — Telephone Encounter (Signed)
No APP response.

## 2014-02-13 NOTE — Telephone Encounter (Signed)
, °

## 2014-02-14 ENCOUNTER — Encounter: Payer: Self-pay | Admitting: Adult Health

## 2014-02-14 ENCOUNTER — Ambulatory Visit (HOSPITAL_BASED_OUTPATIENT_CLINIC_OR_DEPARTMENT_OTHER): Payer: Medicare HMO | Admitting: Adult Health

## 2014-02-14 ENCOUNTER — Telehealth: Payer: Self-pay | Admitting: Adult Health

## 2014-02-14 ENCOUNTER — Other Ambulatory Visit (HOSPITAL_BASED_OUTPATIENT_CLINIC_OR_DEPARTMENT_OTHER): Payer: Medicare HMO

## 2014-02-14 ENCOUNTER — Ambulatory Visit: Payer: Medicare HMO | Admitting: Adult Health

## 2014-02-14 VITALS — BP 185/75 | HR 73 | Temp 98.3°F | Resp 18 | Ht 67.0 in | Wt 143.5 lb

## 2014-02-14 DIAGNOSIS — C50419 Malignant neoplasm of upper-outer quadrant of unspecified female breast: Secondary | ICD-10-CM

## 2014-02-14 DIAGNOSIS — K5289 Other specified noninfective gastroenteritis and colitis: Secondary | ICD-10-CM

## 2014-02-14 DIAGNOSIS — Z170 Estrogen receptor positive status [ER+]: Secondary | ICD-10-CM

## 2014-02-14 LAB — CBC WITH DIFFERENTIAL/PLATELET
BASO%: 1.1 % (ref 0.0–2.0)
Basophils Absolute: 0.1 10*3/uL (ref 0.0–0.1)
EOS ABS: 0.3 10*3/uL (ref 0.0–0.5)
EOS%: 4.7 % (ref 0.0–7.0)
HCT: 37.8 % (ref 34.8–46.6)
HGB: 12.8 g/dL (ref 11.6–15.9)
LYMPH#: 1.9 10*3/uL (ref 0.9–3.3)
LYMPH%: 31.3 % (ref 14.0–49.7)
MCH: 30.3 pg (ref 25.1–34.0)
MCHC: 34 g/dL (ref 31.5–36.0)
MCV: 89.2 fL (ref 79.5–101.0)
MONO#: 0.8 10*3/uL (ref 0.1–0.9)
MONO%: 13.4 % (ref 0.0–14.0)
NEUT#: 3 10*3/uL (ref 1.5–6.5)
NEUT%: 49.5 % (ref 38.4–76.8)
PLATELETS: 194 10*3/uL (ref 145–400)
RBC: 4.23 10*6/uL (ref 3.70–5.45)
RDW: 13.7 % (ref 11.2–14.5)
WBC: 6 10*3/uL (ref 3.9–10.3)

## 2014-02-14 LAB — COMPREHENSIVE METABOLIC PANEL (CC13)
ALBUMIN: 3.8 g/dL (ref 3.5–5.0)
ALT: 17 U/L (ref 0–55)
ANION GAP: 7 meq/L (ref 3–11)
AST: 23 U/L (ref 5–34)
Alkaline Phosphatase: 83 U/L (ref 40–150)
BUN: 10.1 mg/dL (ref 7.0–26.0)
CO2: 27 meq/L (ref 22–29)
Calcium: 9.7 mg/dL (ref 8.4–10.4)
Chloride: 100 mEq/L (ref 98–109)
Creatinine: 0.8 mg/dL (ref 0.6–1.1)
Glucose: 120 mg/dl (ref 70–140)
POTASSIUM: 4.2 meq/L (ref 3.5–5.1)
SODIUM: 133 meq/L — AB (ref 136–145)
TOTAL PROTEIN: 7.3 g/dL (ref 6.4–8.3)
Total Bilirubin: 0.49 mg/dL (ref 0.20–1.20)

## 2014-02-14 NOTE — Telephone Encounter (Signed)
, °

## 2014-02-14 NOTE — Patient Instructions (Signed)
You are doing well.  No sign of recurrence.  I will order a bilateral breast MRI.  I recommend that you follow up with Dr. Donne Hazel regarding your stomach issues.  Continue with a healthy diet, exercise, and self breast exams.    Breast Self-Awareness Practicing breast self-awareness may pick up problems early, prevent significant medical complications, and possibly save your life. By practicing breast self-awareness, you can become familiar with how your breasts look and feel and if your breasts are changing. This allows you to notice changes early. It can also offer you some reassurance that your breast health is good. One way to learn what is normal for your breasts and whether your breasts are changing is to do a breast self-exam. If you find a lump or something that was not present in the past, it is best to contact your caregiver right away. Other findings that should be evaluated by your caregiver include nipple discharge, especially if it is bloody; skin changes or reddening; areas where the skin seems to be pulled in (retracted); or new lumps and bumps. Breast pain is seldom associated with cancer (malignancy), but should also be evaluated by a caregiver. HOW TO PERFORM A BREAST SELF-EXAM The best time to examine your breasts is 5-7 days after your menstrual period is over. During menstruation, the breasts are lumpier, and it may be more difficult to pick up changes. If you do not menstruate, have reached menopause, or had your uterus removed (hysterectomy), you should examine your breasts at regular intervals, such as monthly. If you are breastfeeding, examine your breasts after a feeding or after using a breast pump. Breast implants do not decrease the risk for lumps or tumors, so continue to perform breast self-exams as recommended. Talk to your caregiver about how to determine the difference between the implant and breast tissue. Also, talk about the amount of pressure you should use during the  exam. Over time, you will become more familiar with the variations of your breasts and more comfortable with the exam. A breast self-exam requires you to remove all your clothes above the waist. 1. Look at your breasts and nipples. Stand in front of a mirror in a room with good lighting. With your hands on your hips, push your hands firmly downward. Look for a difference in shape, contour, and size from one breast to the other (asymmetry). Asymmetry includes puckers, dips, or bumps. Also, look for skin changes, such as reddened or scaly areas on the breasts. Look for nipple changes, such as discharge, dimpling, repositioning, or redness. 2. Carefully feel your breasts. This is best done either in the shower or tub while using soapy water or when flat on your back. Place the arm (on the side of the breast you are examining) above your head. Use the pads (not the fingertips) of your three middle fingers on your opposite hand to feel your breasts. Start in the underarm area and use  inch (2 cm) overlapping circles to feel your breast. Use 3 different levels of pressure (light, medium, and firm pressure) at each circle before moving to the next circle. The light pressure is needed to feel the tissue closest to the skin. The medium pressure will help to feel breast tissue a little deeper, while the firm pressure is needed to feel the tissue close to the ribs. Continue the overlapping circles, moving downward over the breast until you feel your ribs below your breast. Then, move one finger-width towards the center of the  body. Continue to use the  inch (2 cm) overlapping circles to feel your breast as you move slowly up toward the collar bone (clavicle) near the base of the neck. Continue the up and down exam using all 3 pressures until you reach the middle of the chest. Do this with each breast, carefully feeling for lumps or changes. 3.  Keep a written record with breast changes or normal findings for each breast.  By writing this information down, you do not need to depend only on memory for size, tenderness, or location. Write down where you are in your menstrual cycle, if you are still menstruating. Breast tissue can have some lumps or thick tissue. However, see your caregiver if you find anything that concerns you.  SEEK MEDICAL CARE IF:  You see a change in shape, contour, or size of your breasts or nipples.   You see skin changes, such as reddened or scaly areas on the breasts or nipples.   You have an unusual discharge from your nipples.   You feel a new lump or unusually thick areas.  Document Released: 07/27/2005 Document Revised: 07/13/2012 Document Reviewed: 11/11/2011 [facility] Patient Information 2015 [place], [place]. This information is not intended to replace advice given to you by your health care provider. Make sure you discuss any questions you have with your health care provider.

## 2014-02-14 NOTE — Progress Notes (Signed)
OFFICE PROGRESS NOTE  CC Dr. Rexford Maus Dr. Gery Pray Dr. Rolm Bookbinder Gwendolyn Grant, MD [place]. [facility] [address] [place] [address]  DIAGNOSIS: 80 year old female with left breast Stage I invasive lobular carcinoma s/p central lumpectomy on 09/02/11  PRIOR THERAPY: 1. Status post lumpectomy on 09/02/2011 with the final pathology revealing an invasive grade 1 lobular carcinoma measuring 0.9 cm with lobular carcinoma in situ noted. Medial and superior margins were focally positive. Patient subsequently underwent additional excision of the superior and deep margins that showed benign breast parenchyma with fibrocystic changes no atypia hyperplasia or malignancy. One sentinel node was negative for metastatic disease.  2. Patient Was started on tamoxifen but she has not been able to tolerate it and [place] it was discontinued on 01/07/2012.  3. Patient began radiation therapy last week at [place] [facility]. She has only received 1 dose but then developed hypertension and was seen the ER at [facility] and subsequently transferred to [facility] for work up including a cardiac catherterization  #4 patient has decided against radiation therapy or any kind of other antiestrogen R. Adjuvant therapy. She is very happy with her decision.  CURRENT THERAPY: Observation   INTERVAL HISTORY: ABERDEEN HAFEN 80 y.o. female returns today for follow up of her history of invasive lobular cancer.  She did not receive any treatment other than surgery.  She is doing well today.  She did have a mammogram in December, and was noted to have increased density in her breasts.  She is concerned because the previous mammogram when she did have breast cancer didn't detect a mass, and she would like another screening MRI.  She does have pain near her lymph surgical site.  She is concerned about a lump like feeling in that area. She did have some abdominal pain over Christmas  and had a small bowel obstruction and an abdominal infection.  She was evaluated by Dr. Donne Hazel who is following her.  She does have occasional loose stools and epigastric pain.  Otherwise, she denies fevers, chills, new pain, unintentional weight loss, or any further concerns.    MEDICAL HISTORY: Past Medical History  Diagnosis Date  . Hyponatremia   . Hypothyroidism   . Diabetes mellitus     type wears insulin pump  . GERD (gastroesophageal reflux disease)   . Arthritis     fingers  . Hypothyroid   . Hypercholesteremia   . Hypertension   . Diabetes mellitus type 1     type I- wears insulin pump  . Osteoarthritis   . PAF (paroxysmal atrial fibrillation)     One episode in 2008, spontaneously converted in the ED, no further treatment  . Thyroid nodule dx 07/2011    [place] q 78mo to follow calcified L thyroid nodule (12/08/11 [place])  . Osteoporosis, post-menopausal     DEXA 12/02/11: -3.5 (with lomax gyn), declines bisphos due to bone pain  . Spondylosis     thoracic spine  . Cancer of upper-outer quadrant of female breast 08/21/2011    ER +  PR +  Her 2 -  Ki67 9%   0.9 cm invasive lobular  Carcinoma ,s/p central lumpectomy with sentinel node biopsy,  ER/PR positive s/p re-excion on 09/16/11 with final pathology showing atypical hyperplasia     ALLERGIES:  is allergic to augmentin; keflex; prednisone; hydrocodone; and latex.  MEDICATIONS:  Current Outpatient Prescriptions  Medication Sig Dispense Refill  . ALPRAZolam (  XANAX) 0.5 MG tablet Take 1 tablet (0.5 mg total) by mouth daily as needed for anxiety.  30 tablet  5  . aspirin 81 MG tablet Take 81 mg by mouth daily.      [name] BIOGAIA PROBIOTIC (BIOGAIA PROBIOTIC) LIQD 3oz before breakfast      . Calcium Citrate-Vitamin D (CITRACAL + D PO) Take 1,000 mg by mouth daily.      . cholecalciferol (VITAMIN D) 1000 UNITS tablet Take 1,000 Units by mouth 2 (two) times daily.       . fish oil-omega-3 fatty acids 1000 MG capsule Take 2 g by mouth  daily.      [name] glucose blood (ONE TOUCH ULTRA TEST) test strip Used to test blood sugars 8 times a day      . insulin aspart (NOVOLOG) 100 UNIT/ML injection Inject into the skin 3 (three) times daily before meals. Pt uses a sliding scale  Determined by the # of carbs eaten.      . insulin glargine (LANTUS) 100 UNIT/ML injection Inject 13 Units into the skin at bedtime.      [name] levothyroxine (SYNTHROID, LEVOTHROID) 75 MCG tablet Take 75 mcg by mouth daily.      [name] losartan (COZAAR) 50 MG tablet Take 1 tablet (50 mg total) by mouth daily.  90 tablet  3  . Multiple Vitamin (MULTIVITAMIN) tablet Take 1 tablet by mouth daily. For breast and bone health      . ONE TOUCH ULTRA TEST test strip TEST as directed 8 TIMES PER DAY  102 each  3  . simvastatin (ZOCOR) 20 MG tablet Take 20 mg by mouth at bedtime.       . vitamin C (ASCORBIC ACID) 500 MG tablet Take 1,000 mg by mouth daily.      . insulin glulisine (APIDRA) 100 UNIT/ML injection Use as indicated in insulin pump, total of 60 units per day. 4 vials per month      . Insulin Infusion Pump Supplies (INSULIN PUMP SYRINGE RESERVOIR) MISC 1 Device by Does not apply route every 3 (three) days. Animas 2 ml      . IV Sets-Tubing (INFUSION SET 23" COMFORT) MISC 1 Device by Does not apply route every 3 (three) days. 13 mm catheter       No current facility-administered medications for this visit.    SURGICAL HISTORY:  Past Surgical History  Procedure Laterality Date  . Cardiovascular stress test  03/17/2012  . Breast surgery      biopsy  . Breast lumpectomy    . Appendectomy    . Abdominal hysterectomy    . Bladder repair  5 yrs ago  . Tonsillectomy   age 80  . Appendectomy  age 41  . Abdominal hysterectomy  yrs ago  . Breast surgery   yrs ago    br bx  . Breast surgery  01/ 23/2013    left central lumpectomy and snbx, re-excision lumpectomy    REVIEW OF SYSTEMS:  A 10 point review of systems was conducted and is otherwise negative except for what  is noted above.    Health Maintenance  Mammogram: 07/2013 Colonoscopy: 07/11/2012 Bone Density Scan: 01/23/14 Pap Smear: due 04/2014 Eye Exam: 02/13/14 Vitamin D Level: 12/2011 Lipid Panel:11/2012 MRI of breasts: 09/19/12   PHYSICAL EXAMINATION: BP 185/75  Pulse 73  Temp(Src) 98.3 F (36.8 C) (Oral)  Resp 18  Ht $R'5\' 7"'Ss$  (1.702 m)  Wt 143 lb 8 oz (65.091 kg)  BMI 22.47  kg/m2 GENERAL: Patient is a well appearing female in no acute distress HEENT:  Sclerae anicteric.  Oropharynx clear and moist. No ulcerations or evidence of oropharyngeal candidiasis. Neck is supple.  NODES:  No cervical, supraclavicular, or axillary lymphadenopathy palpated.  BREAST EXAM:  Left lumpectomy site without nodularity, masses, breast is slightly tender, right breast without nodules, masses, skin changes, breast is slightly tender, benign bilateral breast exam LUNGS:  Clear to auscultation bilaterally.  No wheezes or rhonchi. HEART:  Regular rate and rhythm. No murmur appreciated. ABDOMEN:  Soft, nontender.  Positive, normoactive bowel sounds. No organomegaly palpated. MSK:  No focal spinal tenderness to palpation. Full range of motion bilaterally in the upper extremities. EXTREMITIES:  No peripheral edema.   SKIN:  Clear with no obvious rashes or skin changes. No nail dyscrasia. NEURO:  Nonfocal. Well oriented.  Appropriate affect. ECOG PERFORMANCE STATUS: 0 - Asymptomatic    LABORATORY DATA: Lab Results  Component Value Date   WBC 6.0 02/14/2014   HGB 12.8 02/14/2014   HCT 37.8 02/14/2014   MCV 89.2 02/14/2014   PLT 194 02/14/2014      Chemistry      Component Value Date/Time   NA 133* 02/14/2014 0910   NA 132* 11/27/2013 1223   NA 137 06/16/2011   K 4.2 02/14/2014 0910   K 4.3 11/27/2013 1223   CL 98 11/27/2013 1223   CL 98 09/28/2012 1354   CO2 27 02/14/2014 0910   CO2 26 11/27/2013 1223   BUN 10.1 02/14/2014 0910   BUN 13 11/27/2013 1223   BUN 9 06/16/2011   CREATININE 0.8 02/14/2014 0910   CREATININE 0.7  11/27/2013 1223   CREATININE 0.61 09/08/2013 0938   CREATININE 0.6 06/16/2011   GLU 102 06/16/2011      Component Value Date/Time   CALCIUM 9.7 02/14/2014 0910   CALCIUM 9.5 11/27/2013 1223   ALKPHOS 83 02/14/2014 0910   ALKPHOS 98 02/15/2013 1419   AST 23 02/14/2014 0910   AST 32 02/15/2013 1419   ALT 17 02/14/2014 0910   ALT 22 02/15/2013 1419   BILITOT 0.49 02/14/2014 0910   BILITOT 0.7 02/15/2013 1419     REASON: [id number]: Following a conversation with Dr. Donne Hazel at the Breast Conference on 09/16/11, specimen # 2 actually represents additional superior and deep margins. Part 2 specimen site changed from Breast, excision, additional anterior and deep to Breast, excision, additional superior and deep margins. 09/16/11 09:41:52 AM (gt) FINAL DIAGNOSIS Diagnosis 1. Breast, lumpectomy, Left - INVASIVE GRADE I LOBULAR CARCINOMA, SPANNING 0.9 CM. - LOBULAR CARCINOMA IN SITU PRESENT. - LYMPH/VASCULAR INVASION NOT IDENTIFIED. - MEDIAL AND SUPERIOR MARGINS ARE FOCALLY POSITIVE. - SEE ONCOLOGY TEMPLATE AND COMMENT. 2. Breast, excision, Additional superior and deep margins - BENIGN BREAST PARENCHYMA SHOWING FIBROCYSTIC CHANGES. - NO ATYPIA, HYPERPLASIA OR MALIGNANCY IDENTIFIED. 3. Lymph node, sentinel, biopsy, Left axilla - ONE BENIGN LYMPH NODE WITH NO TUMOR SEEN (0/1). - SEE COMMENT. Microscopic Comment 1. BREAST, INVASIVE TUMOR, WITH LYMPH NODE SAMPLING Specimen, including laterality: Left breast lumpectomy with additional anterior and deep margin, resection and sentinel lymph node biopsy. Procedure: Left breast lumpectomy with additional anterior and deep margin, resection and sentinel lymph node biopsy. Grade: I Tubule formation: 3. Nuclear pleomorphism: 1. Mitotic:1. Tumor size (glass slide measurement): 0.9 cm. Margins: Invasive, distance to closest margin: Medial and superior margin is focally positive. Lymphovascular invasion: Not identified. 1 of 3 Amended copy Corrected  FINAL for Goodpasture, Tamakia C ([id number]) Microscopic Comment(continued) Ductal carcinoma in  situ: No. Lobular neoplasia: Yes, lobular carcinoma in situ present. Tumor focality: Unifocal. Treatment effect: Not applicable. Extent of tumor: Tumor involves breast parenchyma and deep dermis. Lymph nodes: # examined: .1 Lymph nodes with metastasis: 0. Breast prognostic profile: Performed on [id number] Estrogen receptor: 97%, positive. Progesterone receptor: 35%, positive. Her 2 neu: 1.03, not amplified. Ki-67: 9%, low. Non-neoplastic breast: A small amount of usual ductal hyperplasia is present with calcifications. Benign stromal and vascular calcifications are also present. TNM: pT1b, pN0, MX Comments: A cytokeratin AE1/AE3 stain is performed on two blocks (1B and 1G) to determine the extent of the invasive lobular carcinoma. The stains confirm that the medial and superior margins are focally positive. Dr. Lyndon Code has seen this case in consultation with agreement of the positive margin status. A Her-2/neu by [facility] will be repeated on the current tumor. RAH:gt, 09/07/11) 3. Immunohistochemical staining is performed on the sentinel lymph node for cytokeratin AE1/AE3. The stain is negative confirming that there is no metastatic carcinoma. (RH:gt, 09/07/11) Enid Cutter MD   ASSESSMENT: 80 year old female with  #1 stage IA invasive lobular carcinoma of the left breast status post central lumpectomy the tumor was ER positive measuring 0.9 cm. PR +35% ER +97% HER-2/neu negative proliferation marker 9% and low.she has no clinical evidence of recurrent disease  #2 patient Had been on tamoxifen however on 01/07/2012 she discontinued it due to severe myalgias and arthralgias.  #3 patient has declined radiation therapy.  PLAN:  #1 Kelaiah is doing well today.  She has no sign of recurrence.  I will order a screening breast MRI at her request due to her breast density and the fact that her first  mammogram didn't detect her cancer.  She has had screening MRI's in the past.  We reviewed her health maintenance above and discussed survivorship in detail.  I recommended healthy diet, exercise, and monthly self breast exams.    #2 I recommended the patient f/u with Dr. Donne Hazel again for her colitis, and I will send him a copy of this note today as well.    All questions were answered. The patient knows to call the clinic with any problems, questions or concerns. We can certainly see the patient much sooner if necessary.  I spent 25 minutes counseling the patient face to face. The total time spent in the appointment was 30 minutes.  Minette Headland, [place] [phone]

## 2014-02-15 ENCOUNTER — Encounter: Payer: Self-pay | Admitting: Internal Medicine

## 2014-02-15 ENCOUNTER — Telehealth: Payer: Self-pay

## 2014-02-15 ENCOUNTER — Ambulatory Visit (INDEPENDENT_AMBULATORY_CARE_PROVIDER_SITE_OTHER): Payer: Medicare HMO | Admitting: Internal Medicine

## 2014-02-15 VITALS — BP 130/60 | HR 73 | Temp 98.0°F | Ht 67.0 in | Wt 144.5 lb

## 2014-02-15 DIAGNOSIS — E039 Hypothyroidism, unspecified: Secondary | ICD-10-CM

## 2014-02-15 DIAGNOSIS — E109 Type 1 diabetes mellitus without complications: Secondary | ICD-10-CM

## 2014-02-15 DIAGNOSIS — I10 Essential (primary) hypertension: Secondary | ICD-10-CM

## 2014-02-15 DIAGNOSIS — R011 Cardiac murmur, unspecified: Secondary | ICD-10-CM

## 2014-02-15 DIAGNOSIS — E7800 Pure hypercholesterolemia, unspecified: Secondary | ICD-10-CM

## 2014-02-15 MED ORDER — LEVOTHYROXINE SODIUM 100 MCG PO TABS: 100.0000 ug | ORAL_TABLET | Freq: Every day | ORAL | Status: DC

## 2014-02-15 MED ORDER — ALPRAZOLAM 0.5 MG PO TABS: 0.5000 mg | ORAL_TABLET | Freq: Every day | ORAL | Status: DC | PRN

## 2014-02-15 MED ORDER — INSULIN GLULISINE 100 UNIT/ML IJ SOLN: INTRAMUSCULAR | Status: DC

## 2014-02-15 NOTE — Assessment & Plan Note (Signed)
On insulin pump since 2007 - follows with endo to manage same (but notes difficult drive to [facility] at her age) Reviewed settings, works with "pump nurse" q15mo for review Denies complications - sees optho annually On ASA, statin  Lab Results  Component Value Date   HGBA1C 7.4* 08/16/2013

## 2014-02-15 NOTE — Assessment & Plan Note (Signed)
BP Readings from Last 3 Encounters:  02/15/14 130/60  02/14/14 185/75  11/13/13 148/82   DC'd HCTZ 11/2013 because of hyponatremia (which has improved) and started losartan 50mg  qd The current medical regimen is effective;  continue present plan and medications.

## 2014-02-15 NOTE — Progress Notes (Signed)
Subjective:    Patient ID: [name], female    DOB: 16-Nov-1928, 80 y.o.   MRN: [id number]  HPI  Patient is here for follow up  Reviewed chronic medical issues and interval medical events  Past Medical History  Diagnosis Date  . Hyponatremia   . Hypothyroidism   . Diabetes mellitus     type wears insulin pump  . GERD (gastroesophageal reflux disease)   . Arthritis     fingers  . Hypothyroid   . Hypercholesteremia   . Hypertension   . Diabetes mellitus type 1     type I- wears insulin pump  . Osteoarthritis   . PAF (paroxysmal atrial fibrillation)     One episode in 2008, spontaneously converted in the ED, no further treatment  . Thyroid nodule dx 07/2011    [place] q 56mo to follow calcified L thyroid nodule (12/08/11 [place])  . Osteoporosis, post-menopausal     DEXA 12/02/11: -3.5 (with lomax gyn), declines bisphos due to bone pain  . Spondylosis     thoracic spine  . Cancer of upper-outer quadrant of female breast 08/21/2011    ER +  PR +  Her 2 -  Ki67 9%   0.9 cm invasive lobular  Carcinoma ,s/p central lumpectomy with sentinel node biopsy,  ER/PR positive s/p re-excion on 09/16/11 with final pathology showing atypical hyperplasia     Review of Systems  Constitutional: Negative for fever and fatigue.  Respiratory: Negative for cough and shortness of breath.   Cardiovascular: Negative for chest pain and leg swelling.       ?murmur per endo   Neurological: Negative for tremors and weakness.       Objective:   Physical Exam  BP 130/60  Pulse 73  Temp(Src) 98 F (36.7 C) (Oral)  Ht $R'5\' 7"'iS$  (1.702 m)  Wt 144 lb 8 oz (65.545 kg)  BMI 22.63 kg/m2  SpO2 97% Wt Readings from Last 3 Encounters:  02/15/14 144 lb 8 oz (65.545 kg)  02/14/14 143 lb 8 oz (65.091 kg)  11/13/13 142 lb 6.4 oz (64.592 kg)   Constitutional: She appears well-developed and well-nourished. No distress.  Neck: Normal range of motion. Neck supple. No JVD present. No thyromegaly present.    Cardiovascular: Normal rate, regular rhythm and normal heart sounds.  Soft 2/6 systolic murmur heard. No BLE edema. Pulmonary/Chest: Effort normal and breath sounds normal. No respiratory distress. She has no wheezes.  Psychiatric: She has a normal mood and affect. Her behavior is normal. Judgment and thought content normal.   Lab Results  Component Value Date   WBC 6.0 02/14/2014   HGB 12.8 02/14/2014   HCT 37.8 02/14/2014   PLT 194 02/14/2014   GLUCOSE 120 02/14/2014   CHOL 220* 11/23/2012   TRIG 160 11/23/2012   HDL 60 11/23/2012   LDLCALC 102 11/23/2012   ALT 17 02/14/2014   AST 23 02/14/2014   NA 133* 02/14/2014   K 4.2 02/14/2014   CL 98 11/27/2013   CREATININE 0.8 02/14/2014   BUN 10.1 02/14/2014   CO2 27 02/14/2014   TSH 1.88 07/13/2012   INR 1.16 03/18/2012   HGBA1C 7.4* 08/16/2013    Dg Bone Density  01/23/2014   CLINICAL DATA:  Postmenopausal osteoporosis screening.  EXAM: DUAL X-RAY ABSORPTIOMETRY (DXA) FOR BONE MINERAL DENSITY  FINDINGS: AP LUMBAR SPINE L1, L3 and L4  Bone Mineral Density (BMD):  0.707 g/cm2  Young Adult T-Score:  -3.1  Z-Score:  -0.3  Left FEMUR neck  Bone Mineral Density (BMD):  0.486 g/cm2  Young Adult T-Score: -3.3  Z-Score:  -0.9  ASSESSMENT: Patient's diagnostic category is OSTEOPOROSIS by WHO Criteria.  FRACTURE RISK: INCREASED  FRAX:  Not calculated due to T-score at or below -2.5.  COMPARISON: 11.9% decrease in bone density over the lumbar spine compared to 02/02/2000 which is statistically significant. 24.4% decrease in bone density over the total left hip compared to 2001 which is statistically significant.  Effective therapies are available in the form of bisphosphonates, selective estrogen receptor modulators, biologic agents, and hormone replacement therapy (for women). All patients should ensure an adequate intake of dietary calcium (1200 mg daily) and vitamin D (800 IU daily) unless contraindicated.  All treatment decisions require clinical judgment and consideration of  individual patient factors, including patient preferences, co-morbidities, previous drug use, risk factors not captured in the FRAX model (e.g., frailty, falls, vitamin D deficiency, increased bone turnover, interval significant decline in bone density) and possible under- or over-estimation of fracture risk by FRAX.  The National Osteoporosis Foundation recommends that FDA-approved medical therapies be considered in postmenopausal women and men age 65 or older with a:  1. Hip or vertebral (clinical or morphometric) fracture.  2. T-score of -2.5 or lower at the spine or hip.  3. Ten-year fracture probability by FRAX of 3% or greater for hip fracture or 20% or greater for major osteoporotic fracture.  People with diagnosed cases of osteoporosis or at high risk for fracture should have regular bone mineral density tests. For patients eligible for Medicare, routine testing is allowed once every 2 years. The testing frequency can be increased to one year for patients who have rapidly progressing disease, those who are receiving or discontinuing medical therapy to restore bone mass, or have additional risk factors.  World Pharmacologist [facility]) Criteria:  Normal: T-scores from +1.0 to -1.0  Low Bone Mass (Osteopenia): T-scores between -1.0 and -2.5  Osteoporosis: T-scores -2.5 and below  Comparison to Reference Population:  T-score is the key measure used in the diagnosis of osteoporosis and relative risk determination for fracture. It provides a value for bone mass relative to the mean bone mass of a young adult reference population expressed in terms of standard deviation (SD).  Z-score is the age-matched score showing the patient's values compared to a population matched for age, sex, and race. This is also expressed in terms of standard deviation. The patient may have values that compare favorably to the age-matched values and still be at increased risk for fracture.   Electronically Signed   By: Marin Olp  M.D.   On: 01/23/2014 16:45       Assessment & Plan:   Problem List Items Addressed This Visit   Diabetes mellitus type 1      On insulin pump since 2007 - follows with endo to manage same (but notes difficult drive to [facility] at her age) Reviewed settings, works with "pump nurse" q9mo for review Denies complications - sees optho annually On ASA, statin  Lab Results  Component Value Date   HGBA1C 7.4* 08/16/2013      Relevant Medications      insulin glulisine (APIDRA) 100 UNIT/ML injection   Other Relevant Orders      Lipid panel   Hypercholesteremia - Primary     On statin - reports lipids 07/2012 "good" with [facility] endo Check annually, titrate as needed    Relevant Medications      hydrochlorothiazide (HYDRODIURIL) 25  MG tablet   Other Relevant Orders      Lipid panel   Hypertension      BP Readings from Last 3 Encounters:  02/15/14 130/60  02/14/14 185/75  11/13/13 148/82   DC'd HCTZ 11/2013 because of hyponatremia (which has improved) and started losartan $RemoveBeforeDE'50mg'[id number]$  qd The current medical regimen is effective;  continue present plan and medications.    Relevant Medications      hydrochlorothiazide (HYDRODIURIL) 25 MG tablet   Other Relevant Orders      Lipid panel      2D Echocardiogram without contrast      Hemoglobin A1c   Hypothyroid      On medication replacment for years, varying doses reviewed Check TSH and adjust as needed Lab Results  Component Value Date   TSH 1.88 07/13/2012      Relevant Medications      levothyroxine (SYNTHROID, LEVOTHROID) tablet   Other Relevant Orders      TSH    Other Visit Diagnoses   Heart murmur, systolic        Relevant Orders       2D Echocardiogram without contrast

## 2014-02-15 NOTE — Assessment & Plan Note (Signed)
On statin - reports lipids 07/2012 "good" with [facility] endo Check annually, titrate as needed

## 2014-02-15 NOTE — Patient Instructions (Addendum)
It was good to see you today.  We have reviewed your prior records including labs and tests today  Test(s) ordered today. Return when you are fasting. Your results will be released to [place] (or called to you) after review, usually within 72hours after test completion. If any changes need to be made, you will be notified at that same time.  Medications reviewed and updated, no changes recommended at this time.  Please schedule followup in 6 months, call sooner if problems.

## 2014-02-15 NOTE — Progress Notes (Signed)
Pre visit review using our clinic review tool, if applicable. No additional management support is needed unless otherwise documented below in the visit note. 

## 2014-02-15 NOTE — Assessment & Plan Note (Signed)
On medication replacment for years, varying doses reviewed Check TSH and adjust as needed Lab Results  Component Value Date   TSH 1.88 07/13/2012

## 2014-02-15 NOTE — Telephone Encounter (Signed)
Faxed xanax rx to pharm

## 2014-02-23 ENCOUNTER — Ambulatory Visit (HOSPITAL_COMMUNITY): Payer: Medicare HMO

## 2014-03-05 ENCOUNTER — Ambulatory Visit (INDEPENDENT_AMBULATORY_CARE_PROVIDER_SITE_OTHER): Payer: Medicare HMO | Admitting: Internal Medicine

## 2014-03-05 ENCOUNTER — Ambulatory Visit (INDEPENDENT_AMBULATORY_CARE_PROVIDER_SITE_OTHER)
Admission: RE | Admit: 2014-03-05 | Discharge: 2014-03-05 | Disposition: A | Discharge status: Home / Self Care | Payer: Medicare HMO | Source: Ambulatory Visit | Attending: Internal Medicine | Admitting: Internal Medicine

## 2014-03-05 ENCOUNTER — Encounter: Payer: Self-pay | Admitting: Internal Medicine

## 2014-03-05 ENCOUNTER — Other Ambulatory Visit (INDEPENDENT_AMBULATORY_CARE_PROVIDER_SITE_OTHER): Payer: Medicare HMO

## 2014-03-05 VITALS — BP 152/78 | HR 79 | Temp 97.9°F | Ht 67.0 in | Wt 138.0 lb

## 2014-03-05 DIAGNOSIS — K3189 Other diseases of stomach and duodenum: Secondary | ICD-10-CM

## 2014-03-05 DIAGNOSIS — S42302S Unspecified fracture of shaft of humerus, left arm, sequela: Secondary | ICD-10-CM

## 2014-03-05 DIAGNOSIS — S42309S Unspecified fracture of shaft of humerus, unspecified arm, sequela: Secondary | ICD-10-CM

## 2014-03-05 DIAGNOSIS — R079 Chest pain, unspecified: Secondary | ICD-10-CM

## 2014-03-05 DIAGNOSIS — E109 Type 1 diabetes mellitus without complications: Secondary | ICD-10-CM

## 2014-03-05 DIAGNOSIS — I10 Essential (primary) hypertension: Secondary | ICD-10-CM

## 2014-03-05 DIAGNOSIS — E039 Hypothyroidism, unspecified: Secondary | ICD-10-CM

## 2014-03-05 DIAGNOSIS — R0781 Pleurodynia: Secondary | ICD-10-CM

## 2014-03-05 DIAGNOSIS — E7800 Pure hypercholesterolemia, unspecified: Secondary | ICD-10-CM

## 2014-03-05 DIAGNOSIS — R1013 Epigastric pain: Secondary | ICD-10-CM

## 2014-03-05 LAB — HEMOGLOBIN A1C: HEMOGLOBIN A1C: 7.2 % — AB (ref 4.6–6.5)

## 2014-03-05 LAB — LIPID PANEL
CHOLESTEROL: 204 mg/dL — AB (ref 0–200)
HDL: 57 mg/dL (ref >39.00)
LDL Cholesterol: 131 mg/dL — ABNORMAL HIGH (ref 0–99)
NONHDL: 147
Total CHOL/HDL Ratio: 4
Triglycerides: 79 mg/dL (ref 0.0–149.0)
VLDL: 15.8 mg/dL (ref 0.0–40.0)

## 2014-03-05 LAB — TSH: TSH: 15.5 u[IU]/mL — AB (ref 0.35–4.50)

## 2014-03-05 MED ORDER — FAMOTIDINE 20 MG PO TABS: 20.0000 mg | ORAL_TABLET | Freq: Two times a day (BID) | ORAL | Status: DC

## 2014-03-05 NOTE — Patient Instructions (Addendum)
It was good to see you today.  We have reviewed your prior records including labs and tests today  Test(s) ordered today - labs and xray. Your results will be released to [place] (or called to you) after review, usually within 72hours after test completion. If any changes need to be made, you will be notified at that same time.  Medications reviewed and updated, no changes recommended at this time. Use tylenol as needed for pain and OTC pepcid for indigestion  continue working with orthopedics and your other specialists as ongoing  Please keep scheduled followup as planned in 3-4 months, call sooner if problems.  Rib Fracture A rib fracture is a break or crack in one of the bones of the ribs. The ribs are like a cage that goes around your upper chest. A broken or cracked rib is often painful, but most do not cause other problems. Most rib fractures heal on their own in 1-3 months. HOME CARE  Avoid activities that cause pain to the injured area. Protect your injured area.  Slowly increase activity as told by your doctor.  Take medicine as told by your doctor.  Put ice on the injured area for the first 1-2 days after you have been treated or as told by your doctor.  Put ice in a plastic bag.  Place a towel between your skin and the bag.  Leave the ice on for 15-20 minutes at a time, every 2 hours while you are awake.  Do deep breathing as told by your doctor. You may be told to:  Take deep breaths many times a day.  Cough many times a day while hugging a pillow.  Use a device (incentive spirometer) to perform deep breathing many times a day.  Drink enough fluids to keep your pee (urine) clear or pale yellow.   Do not wear a rib belt or binder. These do not allow you to breathe deeply. GET HELP RIGHT AWAY IF:   You have a fever.  You have trouble breathing.   You cannot stop coughing.  You cough up thick or bloody spit (mucus).   You feel sick to your stomach  (nauseous), throw up (vomit), or have belly (abdominal) pain.   Your pain gets worse and medicine does not help.  MAKE SURE YOU:   Understand these instructions.  Will watch your condition.  Will get help right away if you are not doing well or get worse. Document Released: 05/05/2008 Document Revised: 11/21/2012 Document Reviewed: 09/28/2012 [facility] Patient Information 2015 [place], [place]. This information is not intended to replace advice given to you by your health care provider. Make sure you discuss any questions you have with your health care provider.

## 2014-03-05 NOTE — Progress Notes (Signed)
Pre visit review using our clinic review tool, if applicable. No additional management support is needed unless otherwise documented below in the visit note. 

## 2014-03-05 NOTE — Progress Notes (Signed)
Subjective:    Patient ID: [name], female    DOB: 26-Nov-1928, 80 y.o.   MRN: [id number]  HPI  Patient is here for follow up - fall 02/15/14 at [facility] - subsequent fx L hum and foot - ?rib fx given pain with inspiration and direct pressure to lateral posterior rib cage - following with ortho ([facility]) - no surg needed Reviewed chronic medical issues and interval medical events  Past Medical History  Diagnosis Date  . Hyponatremia   . Hypothyroidism   . Diabetes mellitus     type wears insulin pump  . GERD (gastroesophageal reflux disease)   . Arthritis     fingers  . Hypothyroid   . Hypercholesteremia   . Hypertension   . Diabetes mellitus type 1     type I- wears insulin pump  . Osteoarthritis   . PAF (paroxysmal atrial fibrillation)     One episode in 2008, spontaneously converted in the ED, no further treatment  . Thyroid nodule dx 07/2011    [place] q 18mo to follow calcified L thyroid nodule (12/08/11 [place])  . Osteoporosis, post-menopausal     DEXA 12/02/11: -3.5 (with lomax gyn), declines bisphos due to bone pain  . Spondylosis     thoracic spine  . Cancer of upper-outer quadrant of female breast 08/21/2011    ER +  PR +  Her 2 -  Ki67 9%   0.9 cm invasive lobular  Carcinoma ,s/p central lumpectomy with sentinel node biopsy,  ER/PR positive s/p re-excion on 09/16/11 with final pathology showing atypical hyperplasia     Review of Systems  Constitutional: Negative for fever and unexpected weight change.  Respiratory: Negative for cough and shortness of breath.   Cardiovascular: Positive for chest pain (L lower lateral rib since fall 02/15/14). Negative for leg swelling.  Gastrointestinal: Positive for abdominal pain (mild indigestion with meals, no bowel change and not exertional). Negative for nausea, diarrhea, constipation and blood in stool.  Neurological: Negative for tremors, syncope and weakness.       Objective:   Physical Exam  BP 152/78  Pulse 79  Temp(Src) 97.9  F (36.6 C) (Oral)  Ht $R'5\' 7"'DU$  (1.702 m)  Wt 138 lb (62.596 kg)  BMI 21.61 kg/m2  SpO2 98% Wt Readings from Last 3 Encounters:  03/05/14 138 lb (62.596 kg)  02/15/14 144 lb 8 oz (65.545 kg)  02/14/14 143 lb 8 oz (65.091 kg)   Constitutional: She appears well-developed and well-nourished. No distress.  Neck: Normal range of motion. Neck supple. No JVD present. No thyromegaly present. Chest wall - tender to palp over lateral posterior most rib margin -no flail chest or other tenderness to palp  Cardiovascular: Normal rate, regular rhythm and normal heart sounds.  Soft 2/6 syst murmur heard. No BLE edema. Pulmonary/Chest: Effort normal and breath sounds normal. No respiratory distress. She has no wheezes.  MSkel: wearing LUE sling for L hum fx Abdomen: s, NTND, +BS - no r/g Psychiatric: She has a normal mood and affect. Her behavior is normal. Judgment and thought content normal.   Lab Results  Component Value Date   WBC 6.0 02/14/2014   HGB 12.8 02/14/2014   HCT 37.8 02/14/2014   PLT 194 02/14/2014   GLUCOSE 120 02/14/2014   CHOL 220* 11/23/2012   TRIG 160 11/23/2012   HDL 60 11/23/2012   LDLCALC 102 11/23/2012   ALT 17 02/14/2014   AST 23 02/14/2014   NA 133* 02/14/2014  K 4.2 02/14/2014   CL 98 11/27/2013   CREATININE 0.8 02/14/2014   BUN 10.1 02/14/2014   CO2 27 02/14/2014   TSH 1.88 07/13/2012   INR 1.16 03/18/2012   HGBA1C 7.4* 08/16/2013    Dg Bone Density  01/23/2014   CLINICAL DATA:  Postmenopausal osteoporosis screening.  EXAM: DUAL X-RAY ABSORPTIOMETRY (DXA) FOR BONE MINERAL DENSITY  FINDINGS: AP LUMBAR SPINE L1, L3 and L4  Bone Mineral Density (BMD):  0.707 g/cm2  Young Adult T-Score:  -3.1  Z-Score:  -0.3  Left FEMUR neck  Bone Mineral Density (BMD):  0.486 g/cm2  Young Adult T-Score: -3.3  Z-Score:  -0.9  ASSESSMENT: Patient's diagnostic category is OSTEOPOROSIS by WHO Criteria.  FRACTURE RISK: INCREASED  FRAX:  Not calculated due to T-score at or below -2.5.  COMPARISON: 11.9% decrease in bone  density over the lumbar spine compared to 02/02/2000 which is statistically significant. 24.4% decrease in bone density over the total left hip compared to 2001 which is statistically significant.  Effective therapies are available in the form of bisphosphonates, selective estrogen receptor modulators, biologic agents, and hormone replacement therapy (for women). All patients should ensure an adequate intake of dietary calcium (1200 mg daily) and vitamin D (800 IU daily) unless contraindicated.  All treatment decisions require clinical judgment and consideration of individual patient factors, including patient preferences, co-morbidities, previous drug use, risk factors not captured in the FRAX model (e.g., frailty, falls, vitamin D deficiency, increased bone turnover, interval significant decline in bone density) and possible under- or over-estimation of fracture risk by FRAX.  The National Osteoporosis Foundation recommends that FDA-approved medical therapies be considered in postmenopausal women and men age 1 or older with a:  1. Hip or vertebral (clinical or morphometric) fracture.  2. T-score of -2.5 or lower at the spine or hip.  3. Ten-year fracture probability by FRAX of 3% or greater for hip fracture or 20% or greater for major osteoporotic fracture.  People with diagnosed cases of osteoporosis or at high risk for fracture should have regular bone mineral density tests. For patients eligible for Medicare, routine testing is allowed once every 2 years. The testing frequency can be increased to one year for patients who have rapidly progressing disease, those who are receiving or discontinuing medical therapy to restore bone mass, or have additional risk factors.  World Science writer [facility]) Criteria:  Normal: T-scores from +1.0 to -1.0  Low Bone Mass (Osteopenia): T-scores between -1.0 and -2.5  Osteoporosis: T-scores -2.5 and below  Comparison to Reference Population:  T-score is the key measure used  in the diagnosis of osteoporosis and relative risk determination for fracture. It provides a value for bone mass relative to the mean bone mass of a young adult reference population expressed in terms of standard deviation (SD).  Z-score is the age-matched score showing the patient's values compared to a population matched for age, sex, and race. This is also expressed in terms of standard deviation. The patient may have values that compare favorably to the age-matched values and still be at increased risk for fracture.   Electronically Signed   By: Elberta Fortis M.D.   On: 01/23/2014 16:45       Assessment & Plan:   L rib pain s/p fall 02/15/14 with direct trauma - other fracture noted including L hum and foot - eval at [facility] and following with ortho ([facility]) for same - check DG L rib fracture - continue tylenol prn as intol of other  meds  Dyspepsia - no evidence on exam for recurrent ischemic bowel symptoms - advised OTC H2B bid and call if symptoms worse or unimproved  Problem List Items Addressed This Visit   Diabetes mellitus type 1      On insulin pump since 2007 - follows with endo to manage same (but notes difficult drive to [facility] at her age) Reviewed settings, changed from "pump" back to SQ insulin 01/2014 for few months Denies complications - sees optho annually On ASA, statin Check a1c now and forward to endo per pt request  Lab Results  Component Value Date   HGBA1C 7.4* 08/16/2013       Other Visit Diagnoses   Rib pain on left side    -  Primary    Relevant Orders       DG Ribs Unilateral Left    Closed left humeral fracture, sequela        Dyspepsia

## 2014-03-05 NOTE — Assessment & Plan Note (Signed)
On insulin pump since 2007 - follows with endo to manage same (but notes difficult drive to [facility] at her age) Reviewed settings, changed from "pump" back to SQ insulin 01/2014 for few months Denies complications - sees optho annually On ASA, statin Check a1c now and forward to endo per pt request  Lab Results  Component Value Date   HGBA1C 7.4* 08/16/2013

## 2014-03-06 ENCOUNTER — Other Ambulatory Visit: Payer: Self-pay

## 2014-03-06 MED ORDER — LEVOTHYROXINE SODIUM 112 MCG PO TABS: 112.0000 ug | ORAL_TABLET | Freq: Every day | ORAL | Status: DC

## 2014-03-06 NOTE — Addendum Note (Signed)
Addended by: Gwendolyn Grant A on: 03/06/2014 08:25 AM   Modules accepted: Orders

## 2014-03-26 ENCOUNTER — Ambulatory Visit (HOSPITAL_COMMUNITY): Payer: Medicare HMO

## 2014-05-04 ENCOUNTER — Other Ambulatory Visit (HOSPITAL_COMMUNITY): Payer: Self-pay | Admitting: Internal Medicine

## 2014-05-04 DIAGNOSIS — R0602 Shortness of breath: Secondary | ICD-10-CM

## 2014-05-07 ENCOUNTER — Ambulatory Visit (HOSPITAL_COMMUNITY): Payer: Medicare HMO | Attending: Internal Medicine | Admitting: Radiology

## 2014-05-07 DIAGNOSIS — R011 Cardiac murmur, unspecified: Secondary | ICD-10-CM | POA: Diagnosis not present

## 2014-05-07 DIAGNOSIS — I10 Essential (primary) hypertension: Secondary | ICD-10-CM | POA: Diagnosis not present

## 2014-05-07 DIAGNOSIS — E785 Hyperlipidemia, unspecified: Secondary | ICD-10-CM | POA: Insufficient documentation

## 2014-05-07 DIAGNOSIS — E109 Type 1 diabetes mellitus without complications: Secondary | ICD-10-CM | POA: Diagnosis not present

## 2014-05-07 DIAGNOSIS — R0602 Shortness of breath: Secondary | ICD-10-CM

## 2014-05-07 NOTE — Progress Notes (Signed)
Echocardiogram performed.  

## 2014-05-10 LAB — HEMOGLOBIN A1C: Hgb A1c MFr Bld: 7.5 % — AB (ref 4.0–6.0)

## 2014-05-19 ENCOUNTER — Encounter: Payer: Self-pay | Admitting: Internal Medicine

## 2014-05-25 ENCOUNTER — Other Ambulatory Visit: Payer: Self-pay

## 2014-06-26 ENCOUNTER — Other Ambulatory Visit (INDEPENDENT_AMBULATORY_CARE_PROVIDER_SITE_OTHER): Payer: Self-pay | Admitting: General Surgery

## 2014-06-26 DIAGNOSIS — K432 Incisional hernia without obstruction or gangrene: Secondary | ICD-10-CM

## 2014-06-29 ENCOUNTER — Ambulatory Visit
Admission: RE | Admit: 2014-06-29 | Discharge: 2014-06-29 | Disposition: A | Discharge status: Home / Self Care | Payer: Medicare HMO | Source: Ambulatory Visit | Attending: General Surgery | Admitting: General Surgery

## 2014-06-29 DIAGNOSIS — K432 Incisional hernia without obstruction or gangrene: Secondary | ICD-10-CM

## 2014-06-29 MED ORDER — IOHEXOL 300 MG/ML  SOLN
100.0000 mL | Freq: Once | INTRAMUSCULAR | Status: AC | PRN
  Administered 2014-06-29: 100 mL via INTRAVENOUS

## 2014-07-19 ENCOUNTER — Encounter (HOSPITAL_COMMUNITY): Payer: Self-pay | Admitting: Cardiology

## 2014-08-01 ENCOUNTER — Encounter: Payer: Self-pay | Admitting: Internal Medicine

## 2014-08-01 ENCOUNTER — Ambulatory Visit (INDEPENDENT_AMBULATORY_CARE_PROVIDER_SITE_OTHER): Payer: Medicare HMO | Admitting: Internal Medicine

## 2014-08-01 VITALS — BP 132/70 | HR 83 | Temp 98.3°F | Ht 67.0 in | Wt 143.0 lb

## 2014-08-01 DIAGNOSIS — B370 Candidal stomatitis: Secondary | ICD-10-CM

## 2014-08-01 MED ORDER — CHLORHEXIDINE GLUCONATE 0.12 % MT SOLN: OROMUCOSAL | Status: DC

## 2014-08-01 MED ORDER — FLUCONAZOLE 100 MG PO TABS: ORAL_TABLET | ORAL | Status: DC

## 2014-08-01 NOTE — Patient Instructions (Addendum)
Swish the cleansing agent well over the mouth  lesions twice a day and then expectorate.  Take the 2 antifungal pills each day.  Please leave the lower denture out to allow healing  You should go to the emergency room if there is not progressive improvement of the oral lesions over the next 48 hours or if fever or increasing pain present.  Hold the simvastatin until you've completed the antifungal agent ; it could raise the level of the statin.  To prevent irritating oral lesions; stay on a soft or liquid diet.

## 2014-08-01 NOTE — Progress Notes (Signed)
Pre visit review using our clinic review tool, if applicable. No additional management support is needed unless otherwise documented below in the visit note. 

## 2014-08-01 NOTE — Progress Notes (Signed)
   Subjective:    Patient ID: [name], female    DOB: [birth year], 80 y.o.   MRN: [id number]  HPI  She believes she had a canker sore appear under her tongue 2 weeks ago. She used over-the-counter topical agents without benefit  She went to the urgent care in her home community last night and was told that her salivary gland was blocked. She stated that a mycin was prescribed but she was fearful of potential side effects.  Specifically her concern was that she's had partial bowel resection for ischemic bowel and she has chronically loose stools.  She is a diabetic; her most recent A1c was 7.5% in October 2015. She sees an endocrinologist in [place], [place]. Last night after the meal glucose was up to 300. This morning fasting blood sugar was 81.  She describes some chills and feeling hot intermittently.  Review of Systems  She denies documented fever. She's had no purulent secretions from the intraoral lesions  She has no symptoms of upper respiratory tract infection. Frontal headache, facial pain , nasal purulence, sore throat , otic pain or otic discharge denied.      Objective:   Physical Exam  Positive or pertinent findings include: She is thin but adequately nourished. She is in no acute distress She has complete dentures There is a diffuse irregular exudate which is grayish in color below the tongue on the right. The tongue itself is spared. She has no associated cervical lymphadenopathy. No parotid enlargement.  Eyes: No conjunctival inflammation or lid edema is present. There is no scleral icterus. Ears:  External ear exam shows no significant lesions or deformities.  Otoscopic examination reveals clear canals, tympanic membranes are intact bilaterally without bulging, retraction, inflammation or discharge. Nose:  External nasal examination shows no deformity or inflammation. Nasal mucosa are pink and moist without lesions or exudates. No septal dislocation  or deviation.No obstruction to airflow.  Neck:  No deformities, thyromegaly, masses, or tenderness noted.   Supple with full range of motion without pain.  Heart:  Normal rate and regular rhythm. S1 and S2 normal without gallop, murmur, click, rub or other extra sounds.  Lungs:Chest clear to auscultation; no wheezes, rhonchi,rales ,or rubs present.No increased work of breathing.   Extremities:  No cyanosis, edema, or clubbing  noted  Skin: Warm & dry w/o jaundice or tenting.         Assessment & Plan:  #1 oral candidiasis suggested; clinically there is no evidence of an Aphthous ulcer or canker sore.  #2 diabetes, questionable control. She has Daryel Gerald variable sugars new  Plan: See orders recommendations.

## 2014-08-17 ENCOUNTER — Other Ambulatory Visit: Payer: Self-pay | Admitting: *Deleted

## 2014-08-17 DIAGNOSIS — C50419 Malignant neoplasm of upper-outer quadrant of unspecified female breast: Secondary | ICD-10-CM

## 2014-08-20 ENCOUNTER — Telehealth: Payer: Self-pay | Admitting: Family

## 2014-08-20 ENCOUNTER — Other Ambulatory Visit (HOSPITAL_BASED_OUTPATIENT_CLINIC_OR_DEPARTMENT_OTHER): Payer: Medicare HMO

## 2014-08-20 ENCOUNTER — Telehealth: Payer: Self-pay | Admitting: Hematology and Oncology

## 2014-08-20 ENCOUNTER — Other Ambulatory Visit (INDEPENDENT_AMBULATORY_CARE_PROVIDER_SITE_OTHER): Payer: Medicare HMO

## 2014-08-20 ENCOUNTER — Ambulatory Visit (INDEPENDENT_AMBULATORY_CARE_PROVIDER_SITE_OTHER): Payer: Medicare HMO | Admitting: Family

## 2014-08-20 ENCOUNTER — Encounter: Payer: Self-pay | Admitting: Family

## 2014-08-20 ENCOUNTER — Ambulatory Visit (HOSPITAL_BASED_OUTPATIENT_CLINIC_OR_DEPARTMENT_OTHER): Payer: Medicare HMO | Admitting: Hematology and Oncology

## 2014-08-20 ENCOUNTER — Ambulatory Visit: Payer: Medicare HMO | Admitting: Internal Medicine

## 2014-08-20 VITALS — BP 157/57 | HR 84 | Temp 97.5°F | Resp 18 | Ht 67.0 in | Wt 143.1 lb

## 2014-08-20 VITALS — BP 118/58 | HR 91 | Temp 97.9°F | Ht 67.0 in | Wt 141.0 lb

## 2014-08-20 DIAGNOSIS — E039 Hypothyroidism, unspecified: Secondary | ICD-10-CM

## 2014-08-20 DIAGNOSIS — E785 Hyperlipidemia, unspecified: Secondary | ICD-10-CM

## 2014-08-20 DIAGNOSIS — I10 Essential (primary) hypertension: Secondary | ICD-10-CM

## 2014-08-20 DIAGNOSIS — Z853 Personal history of malignant neoplasm of breast: Secondary | ICD-10-CM

## 2014-08-20 DIAGNOSIS — E7800 Pure hypercholesterolemia, unspecified: Secondary | ICD-10-CM

## 2014-08-20 DIAGNOSIS — B370 Candidal stomatitis: Secondary | ICD-10-CM

## 2014-08-20 DIAGNOSIS — E109 Type 1 diabetes mellitus without complications: Secondary | ICD-10-CM

## 2014-08-20 DIAGNOSIS — C50419 Malignant neoplasm of upper-outer quadrant of unspecified female breast: Secondary | ICD-10-CM

## 2014-08-20 DIAGNOSIS — C50412 Malignant neoplasm of upper-outer quadrant of left female breast: Secondary | ICD-10-CM

## 2014-08-20 LAB — BASIC METABOLIC PANEL
BUN: 13 mg/dL (ref 6–23)
CO2: 27 mEq/L (ref 19–32)
CREATININE: 0.6 mg/dL (ref 0.4–1.2)
Calcium: 9.4 mg/dL (ref 8.4–10.5)
Chloride: 90 mEq/L — ABNORMAL LOW (ref 96–112)
GFR: 94.15 mL/min (ref >60.00)
GLUCOSE: 296 mg/dL — AB (ref 70–99)
POTASSIUM: 3.9 meq/L (ref 3.5–5.1)
Sodium: 124 mEq/L — ABNORMAL LOW (ref 135–145)

## 2014-08-20 LAB — COMPREHENSIVE METABOLIC PANEL (CC13)
ALT: 17 U/L (ref 0–55)
AST: 29 U/L (ref 5–34)
Albumin: 4.1 g/dL (ref 3.5–5.0)
Alkaline Phosphatase: 101 U/L (ref 40–150)
Anion Gap: 9 mEq/L (ref 3–11)
BILIRUBIN TOTAL: 0.56 mg/dL (ref 0.20–1.20)
BUN: 11.8 mg/dL (ref 7.0–26.0)
CO2: 27 meq/L (ref 22–29)
CREATININE: 0.8 mg/dL (ref 0.6–1.1)
Calcium: 9.6 mg/dL (ref 8.4–10.4)
Chloride: 92 mEq/L — ABNORMAL LOW (ref 98–109)
EGFR: 74 mL/min/{1.73_m2} — ABNORMAL LOW (ref >90)
Glucose: 130 mg/dl (ref 70–140)
Potassium: 3.7 mEq/L (ref 3.5–5.1)
Sodium: 128 mEq/L — ABNORMAL LOW (ref 136–145)
TOTAL PROTEIN: 7.5 g/dL (ref 6.4–8.3)

## 2014-08-20 LAB — HEPATIC FUNCTION PANEL
ALK PHOS: 86 U/L (ref 39–117)
ALT: 15 U/L (ref 0–35)
AST: 31 U/L (ref 0–37)
Albumin: 4.2 g/dL (ref 3.5–5.2)
Bilirubin, Direct: 0.1 mg/dL (ref 0.0–0.3)
Total Bilirubin: 0.7 mg/dL (ref 0.2–1.2)
Total Protein: 7.7 g/dL (ref 6.0–8.3)

## 2014-08-20 LAB — CBC WITH DIFFERENTIAL/PLATELET
BASO%: 0.5 % (ref 0.0–2.0)
BASOS ABS: 0 10*3/uL (ref 0.0–0.1)
EOS ABS: 0.1 10*3/uL (ref 0.0–0.5)
EOS%: 1.2 % (ref 0.0–7.0)
HCT: 37.9 % (ref 34.8–46.6)
HGB: 12.6 g/dL (ref 11.6–15.9)
LYMPH#: 2.1 10*3/uL (ref 0.9–3.3)
LYMPH%: 28.7 % (ref 14.0–49.7)
MCH: 29.7 pg (ref 25.1–34.0)
MCHC: 33.3 g/dL (ref 31.5–36.0)
MCV: 89.1 fL (ref 79.5–101.0)
MONO#: 1 10*3/uL — ABNORMAL HIGH (ref 0.1–0.9)
MONO%: 13.2 % (ref 0.0–14.0)
NEUT%: 56.4 % (ref 38.4–76.8)
NEUTROS ABS: 4.1 10*3/uL (ref 1.5–6.5)
Platelets: 224 10*3/uL (ref 145–400)
RBC: 4.25 10*6/uL (ref 3.70–5.45)
RDW: 12.7 % (ref 11.2–14.5)
WBC: 7.3 10*3/uL (ref 3.9–10.3)

## 2014-08-20 MED ORDER — ALPRAZOLAM 0.5 MG PO TABS: 0.5000 mg | ORAL_TABLET | Freq: Every day | ORAL | Status: DC | PRN

## 2014-08-20 MED ORDER — LOSARTAN POTASSIUM 50 MG PO TABS: 50.0000 mg | ORAL_TABLET | Freq: Every day | ORAL | Status: DC

## 2014-08-20 MED ORDER — CHLORHEXIDINE GLUCONATE 0.12 % MT SOLN: OROMUCOSAL | Status: DC

## 2014-08-20 NOTE — Progress Notes (Signed)
Patient Care Team: Rowe Clack, MD as PCP - General (Internal Medicine) Deatra Robinson, MD (Hematology and Oncology) Rolm Bookbinder, MD (General Surgery) Selinda Orion, MD (Obstetrics and Gynecology) Blair Promise, MD (Radiation Oncology) Lonna Duval, MD (Endocrinology)  DIAGNOSIS: Primary cancer of upper outer quadrant of left female breast   Staging form: Breast, AJCC 7th Edition     Clinical: Stage IIA (Free text: [email or web address]) - Signed by Rolm Bookbinder, MD on 08/26/2011       Staging comments: Clinically Staged in Breast Conference 08/26/11      Pathologic: Stage IA (T1b, N0, cM0) - Signed by Rolm Bookbinder, MD on 09/24/2011  DIAGNOSIS: left breast Stage I invasive lobular carcinoma s/p central lumpectomy on 09/02/11  PRIOR THERAPY: 1. Status post lumpectomy on 09/02/2011 with the final pathology revealing an invasive grade 1 lobular carcinoma measuring 0.9 cm with lobular carcinoma in situ noted. Medial and superior margins were focally positive. Patient subsequently underwent additional excision of the superior and deep margins that showed benign breast parenchyma with fibrocystic changes no atypia hyperplasia or malignancy. One sentinel node was negative for metastatic disease.  2. Patient Was started on tamoxifen but she has not been able to tolerate it and [place] it was discontinued on 01/07/2012.  3. Patient began radiation therapy last week at [place] center. She has only received 1 dose but then developed hypertension and was seen the ER at [facility] and subsequently transferred to [facility] for work up including a cardiac catherterization  #4 patient has decided against radiation therapy or any kind of other antiestrogen R. Adjuvant therapy. She is very happy with her decision.  CURRENT THERAPY: Observation  CHIEF COMPLIANT: Follow-up of breast cancer  INTERVAL HISTORY: [name] is a 81 year old lady with above-mentioned history of  left-sided breast cancer treated with lumpectomy. She could not tolerate tamoxifen or radiation therapy. She is not taking any such interventions currently. She is feeling well without any major problems or concerns. Denies any lumps or nodules in the breasts. She recently fell and broke her left shoulder. She is currently getting physical therapy which causes her breast to be extremely sore.  REVIEW OF SYSTEMS:   Constitutional: Denies fevers, chills or abnormal weight loss Eyes: Denies blurriness of vision Ears, nose, mouth, throat, and face: Denies mucositis or sore throat Respiratory: Denies cough, dyspnea or wheezes Cardiovascular: Denies palpitation, chest discomfort or lower extremity swelling Gastrointestinal:  Denies nausea, heartburn or change in bowel habits Skin: Denies abnormal skin rashes Lymphatics: Denies new lymphadenopathy or easy bruising Neurological:Denies numbness, tingling or new weaknesses Behavioral/Psych: Mood is stable, no new changes  Breast:  denies any pain or lumps or nodules in either breasts All other systems were reviewed with the patient and are negative.  I have reviewed the past medical history, past surgical history, social history and family history with the patient and they are unchanged from previous note.  ALLERGIES:  is allergic to augmentin; keflex; prednisone; hydrocodone; and latex.  MEDICATIONS:  Current Outpatient Prescriptions  Medication Sig Dispense Refill  . ALPRAZolam (XANAX) 0.5 MG tablet Take 1 tablet (0.5 mg total) by mouth daily as needed for anxiety. 30 tablet 0  . aspirin 81 MG tablet Take 81 mg by mouth daily.    . BD PEN NEEDLE NANO U/F 32G X 4 MM MISC   0  . BIOGAIA PROBIOTIC (BIOGAIA PROBIOTIC) LIQD 3oz before breakfast    . Calcium Citrate-Vitamin D (CITRACAL + D PO)  Take 1,000 mg by mouth daily.    . chlorhexidine (PERIDEX) 0.12 % solution Swish 15 milliliters well bid over the oral lesions then expectorate 240 mL 0  .  cholecalciferol (VITAMIN D) 1000 UNITS tablet Take 1,000 Units by mouth 2 (two) times daily.     . famotidine (PEPCID) 20 MG tablet Take 1 tablet (20 mg total) by mouth 2 (two) times daily.    . fish oil-omega-3 fatty acids 1000 MG capsule Take 2 g by mouth daily.    . fluconazole (DIFLUCAN) 100 MG tablet 2 daily 14 tablet 0  . glucose blood (ONE TOUCH ULTRA TEST) test strip Used to test blood sugars 8 times a day    . hydrochlorothiazide (HYDRODIURIL) 25 MG tablet Take 25 mg by mouth.    . insulin aspart (NOVOLOG) 100 UNIT/ML injection Inject into the skin 3 (three) times daily before meals. Pt uses a sliding scale  Determined by the # of carbs eaten.    . insulin glargine (LANTUS) 100 UNIT/ML injection Inject 13 Units into the skin at bedtime.    . IV Sets-Tubing (INFUSION SET 23" COMFORT) MISC 1 Device by Does not apply route every 3 (three) days. 13 mm catheter    . levothyroxine (SYNTHROID, LEVOTHROID) 112 MCG tablet Take 1 tablet (112 mcg total) by mouth daily. 90 tablet 1  . losartan (COZAAR) 50 MG tablet Take 1 tablet (50 mg total) by mouth daily. 90 tablet 3  . Multiple Vitamin (MULTIVITAMIN) tablet Take 1 tablet by mouth daily. For breast and bone health    . ondansetron (ZOFRAN) 4 MG tablet     . ONE TOUCH ULTRA TEST test strip TEST as directed 8 TIMES PER DAY 102 each 3  . simvastatin (ZOCOR) 20 MG tablet Take 20 mg by mouth at bedtime.     . vitamin C (ASCORBIC ACID) 500 MG tablet Take 1,000 mg by mouth daily.     No current facility-administered medications for this visit.    PHYSICAL EXAMINATION: ECOG PERFORMANCE STATUS: 1 - Symptomatic but completely ambulatory  Filed Vitals:   08/20/14 1430  BP: 157/57  Pulse: 84  Temp: 97.5 F (36.4 C)  Resp: 18   Filed Weights   08/20/14 1430  Weight: 143 lb 1.6 oz (64.91 kg)    GENERAL:alert, no distress and comfortable SKIN: skin color, texture, turgor are normal, no rashes or significant lesions EYES: normal, Conjunctiva  are pink and non-injected, sclera clear OROPHARYNX:no exudate, no erythema and lips, buccal mucosa, and tongue normal  NECK: supple, thyroid normal size, non-tender, without nodularity LYMPH:  no palpable lymphadenopathy in the cervical, axillary or inguinal LUNGS: clear to auscultation and percussion with normal breathing effort HEART: regular rate & rhythm and no murmurs and no lower extremity edema ABDOMEN:abdomen soft, non-tender and normal bowel sounds Musculoskeletal:no cyanosis of digits and no clubbing  NEURO: alert & oriented x 3 with fluent speech, no focal motor/sensory deficits BREAST: No palpable masses or nodules in either right or left breasts. Left breast area is tender to palpation. No palpable axillary supraclavicular or infraclavicular adenopathy no breast tenderness or nipple discharge.   LABORATORY DATA:  I have reviewed the data as listed   Chemistry      Component Value Date/Time   NA 128* 08/20/2014 1346   NA 124* 08/20/2014 1016   NA 137 06/16/2011   K 3.7 08/20/2014 1346   K 3.9 08/20/2014 1016   CL 90* 08/20/2014 1016   CL 98 09/28/2012 1354  CO2 27 08/20/2014 1346   CO2 27 08/20/2014 1016   BUN 11.8 08/20/2014 1346   BUN 13 08/20/2014 1016   BUN 9 06/16/2011   CREATININE 0.8 08/20/2014 1346   CREATININE 0.6 08/20/2014 1016   CREATININE 0.61 09/08/2013 0938   CREATININE 0.6 06/16/2011   GLU 102 06/16/2011      Component Value Date/Time   CALCIUM 9.6 08/20/2014 1346   CALCIUM 9.4 08/20/2014 1016   ALKPHOS 101 08/20/2014 1346   ALKPHOS 86 08/20/2014 1016   AST 29 08/20/2014 1346   AST 31 08/20/2014 1016   ALT 17 08/20/2014 1346   ALT 15 08/20/2014 1016   BILITOT 0.56 08/20/2014 1346   BILITOT 0.7 08/20/2014 1016       Lab Results  Component Value Date   WBC 7.3 08/20/2014   HGB 12.6 08/20/2014   HCT 37.9 08/20/2014   MCV 89.1 08/20/2014   PLT 224 08/20/2014   NEUTROABS 4.1 08/20/2014     RADIOGRAPHIC STUDIES: I have personally  reviewed the radiology reports and agreed with their findings. Mammogram March 2015 is normal  ASSESSMENT & PLAN:  Primary cancer of upper outer quadrant of left female breast Left breast invasive lobular cancer status post lumpectomy 09/02/2011 0.9 cm T1b N0 M0 stage IA 1 sentinel node negative, attempted tamoxifen briefly but discontinued 01/07/2012 attempted radiation times one day but then discontinued because she collapsed and required cardiac catheterization. Currently in observation  Breast cancer surveillance:  Breast exam 08/20/2014 is normal.  Mammograms March 2015 were normal. Patient's invasive lobular cancer was only detected to an MRI. She would like to get another MRI. We discussed that it is not necessary and that we would like to repeat an MRI in 3 years in 2017 based on her personal concerns for breast cancer recurrence and the mammogram not being able to identify it at an early stage.  Return to clinic in 1 year for follow-up.   Orders Placed This Encounter  Procedures  . CBC with Differential    Standing Status: Future     Number of Occurrences:      Standing Expiration Date: 08/20/2015  . Comprehensive metabolic panel (Cmet) - [facility]    Standing Status: Future     Number of Occurrences:      Standing Expiration Date: 08/20/2015   The patient has a good understanding of the overall plan. she agrees with it. She will call with any problems that may develop before her next visit here.   Rulon Eisenmenger, MD 08/20/2014 3:27 PM

## 2014-08-20 NOTE — Assessment & Plan Note (Signed)
Patient is not currently wearing an insulin pump and is maintained on NovoLog and Lantus. States that her A1c has been creeping up per endocrinology. May have to go back on the insulin pump. Continue current Lantus and NovoLog the prescribed dosages. Changes per endocrinology.

## 2014-08-20 NOTE — Progress Notes (Signed)
Pre visit review using our clinic review tool, if applicable. No additional management support is needed unless otherwise documented below in the visit note. 

## 2014-08-20 NOTE — Assessment & Plan Note (Signed)
Stable with current regimen of simvastatin. Obtain a hepatic panel to check liver function. Continue current dosage of simvastatin.

## 2014-08-20 NOTE — Progress Notes (Signed)
 Subjective:    Patient ID: [name], female    DOB: 04/05/1929, 81 y.o.   MRN: [id number]  Chief Complaint  Patient presents with  . Follow-up    HPI:  [name] is a 81 y.o. female who presents today for follow up.  1) Hypertension - Indicates her home blood pressure is doing good at home. Has had previous issues with sodium levels. Currently stable and treated with HCTZ and losartan.   BP Readings from Last 3 Encounters:  08/20/14 118/58  08/01/14 132/70  03/05/14 152/78    2) Hypothyroidism - currently treated with levothyroixine.   Lab Results  Component Value Date   TSH 15.50* 03/05/2014    3) Diabetes - currently treated with apidra and is maintained in [place] by Dr. Cantly. Has been taken off the pump and is managed by endocrinology.   Lab Results  Component Value Date   HGBA1C 7.5* 05/10/2014    4) Hyperlipidemia - currently stable and treated with simvastatin. Due for hepatic panel check.  Lab Results  Component Value Date   CHOL 204* 03/05/2014   HDL 57.00 03/05/2014   LDLCALC 131* 03/05/2014   TRIG 79.0 03/05/2014   CHOLHDL 4 03/05/2014    Allergies  Allergen Reactions  . Augmentin [Amoxicillin-Pot Clavulanate] Other (See Comments)    Gi upset only no rash or hives   . Keflex [Cephalexin] Hives and Nausea Only    Stomach upset  . Prednisone     Other reaction(s): Other Makes blood sugars run high  . Hydrocodone Rash    Rash to chest , side back  . Latex Rash    Powder in gloves causes rash; "not allergic to latex just the powder"    Current Outpatient Prescriptions on File Prior to Visit  Medication Sig Dispense Refill  . ALPRAZolam (XANAX) 0.5 MG tablet Take 1 tablet (0.5 mg total) by mouth daily as needed for anxiety. 30 tablet 5  . aspirin 81 MG tablet Take 81 mg by mouth daily.    . BIOGAIA PROBIOTIC (BIOGAIA PROBIOTIC) LIQD 3oz before breakfast    . Calcium Citrate-Vitamin D (CITRACAL + D PO) Take 1,000 mg by  mouth daily.    . chlorhexidine (PERIDEX) 0.12 % solution Swish 15 milliliters well bid over the oral lesions then expectorate 240 mL 0  . cholecalciferol (VITAMIN D) 1000 UNITS tablet Take 1,000 Units by mouth 2 (two) times daily.     . famotidine (PEPCID) 20 MG tablet Take 1 tablet (20 mg total) by mouth 2 (two) times daily.    . fish oil-omega-3 fatty acids 1000 MG capsule Take 2 g by mouth daily.    . fluconazole (DIFLUCAN) 100 MG tablet 2 daily 14 tablet 0  . glucose blood (ONE TOUCH ULTRA TEST) test strip Used to test blood sugars 8 times a day    . hydrochlorothiazide (HYDRODIURIL) 25 MG tablet Take 25 mg by mouth.    . insulin aspart (NOVOLOG) 100 UNIT/ML injection Inject into the skin 3 (three) times daily before meals. Pt uses a sliding scale  Determined by the # of carbs eaten.    . insulin glargine (LANTUS) 100 UNIT/ML injection Inject 13 Units into the skin at bedtime.    . IV Sets-Tubing (INFUSION SET 23" COMFORT) MISC 1 Device by Does not apply route every 3 (three) days. 13 mm catheter    . levothyroxine (SYNTHROID, LEVOTHROID) 112 MCG tablet Take 1 tablet (112 mcg total) by mouth daily. 90   tablet 1  . losartan (COZAAR) 50 MG tablet Take 1 tablet (50 mg total) by mouth daily. 90 tablet 3  . Multiple Vitamin (MULTIVITAMIN) tablet Take 1 tablet by mouth daily. For breast and bone health    . ondansetron (ZOFRAN) 4 MG tablet     . ONE TOUCH ULTRA TEST test strip TEST as directed 8 TIMES PER DAY 102 each 3  . simvastatin (ZOCOR) 20 MG tablet Take 20 mg by mouth at bedtime.     . vitamin C (ASCORBIC ACID) 500 MG tablet Take 1,000 mg by mouth daily.    . insulin glulisine (APIDRA) 100 UNIT/ML injection Per endo only as indicated with insulin pump (total of 60 units per day). 4 vials per month (Patient not taking: Reported on 08/20/2014) 10 mL    No current facility-administered medications on file prior to visit.    Past Medical History  Diagnosis Date  . Hyponatremia   .  Hypothyroidism   . Diabetes mellitus     type wears insulin pump  . GERD (gastroesophageal reflux disease)   . Arthritis     fingers  . Hypothyroid   . Hypercholesteremia   . Hypertension   . Diabetes mellitus type 1     type I- wears insulin pump  . Osteoarthritis   . PAF (paroxysmal atrial fibrillation)     One episode in 2008, spontaneously converted in the ED, no further treatment  . Thyroid nodule dx 07/2011    US q 6mo to follow calcified L thyroid nodule (12/08/11 US)  . Osteoporosis, post-menopausal     DEXA 12/02/11: -3.5 (with lomax gyn), declines bisphos due to bone pain  . Spondylosis     thoracic spine  . Cancer of upper-outer quadrant of female breast 08/21/2011    ER +  PR +  Her 2 -  Ki67 9%   0.9 cm invasive lobular  Carcinoma ,s/p central lumpectomy with sentinel node biopsy,  ER/PR positive s/p re-excion on 09/16/11 with final pathology showing atypical hyperplasia      Review of Systems  Eyes:       Denies changes in vision  Respiratory: Negative for chest tightness.   Cardiovascular: Negative for chest pain, palpitations and leg swelling.  Endocrine: Negative for cold intolerance, heat intolerance, polydipsia, polyphagia and polyuria.  Neurological: Negative for numbness.      Objective:    BP 118/58 mmHg  Pulse 91  Temp(Src) 97.9 F (36.6 C) (Oral)  Ht 5' 7" (1.702 m)  Wt 141 lb (63.957 kg)  BMI 22.08 kg/m2  SpO2 95% Nursing note and vital signs reviewed.  Physical Exam  Constitutional: She is oriented to person, place, and time. She appears well-developed and well-nourished. No distress.  Cardiovascular: Normal rate, regular rhythm, normal heart sounds and intact distal pulses.   Pulmonary/Chest: Effort normal and breath sounds normal.  Neurological: She is alert and oriented to person, place, and time.  Skin: Skin is warm and dry.  Psychiatric: She has a normal mood and affect. Her behavior is normal. Judgment and thought content normal.         Assessment & Plan:    

## 2014-08-20 NOTE — Assessment & Plan Note (Signed)
Previous TSH was 15.5 was adjusted to 112 g of levothyroxine. Patient is currently being treated by endocrinology. Indicates her last TSH was in the normal limits. Continue current levothyroxine dosage.

## 2014-08-20 NOTE — Telephone Encounter (Signed)
Please inform the patient:   Your lab results for your liver function look good. The abnormal lab is your sodium level once again. If you are taking the hydrochlorothiazide, we need to discontinue this medication. Based on your current blood pressure you may not need a second agent. Therefore I would recommend just stopping the hydrochlorothiazide with no additional medications. Please plan to follow up in a about a week after discontinuing the medication to recheck your sodium.

## 2014-08-20 NOTE — Assessment & Plan Note (Signed)
Left breast invasive lobular cancer status post lumpectomy 09/02/2011 0.9 cm T1b N0 M0 stage IA 1 sentinel node negative, attempted tamoxifen briefly but discontinued 01/07/2012 attempted radiation times one day but then discontinued because she collapsed and required cardiac catheterization. Currently in observation  Breast cancer surveillance:  Breast exam 08/20/2014 is normal.  Mammograms March 2015 were normal. Patient's invasive lobular cancer was only detected to an MRI. She would like to get another MRI. We discussed that it is not necessary and that we would like to repeat an MRI in 3 years in 2017 based on her personal concerns for breast cancer recurrence and the mammogram not being able to identify it at an early stage.  Return to clinic in 1 year for follow-up.

## 2014-08-20 NOTE — Assessment & Plan Note (Signed)
Stable with current regimen. Obtain basic metabolic panel check kidney function. Continue current dosage of hydrochlorothiazide and losartan.

## 2014-08-20 NOTE — Telephone Encounter (Signed)
per pof to sch pt appt-gave pt copy of sch °

## 2014-08-20 NOTE — Telephone Encounter (Signed)
Pt stated that she will stop taking the HCTZ and follow back up in a week. She was sent to scheduling to make an appointment.

## 2014-08-20 NOTE — Patient Instructions (Signed)
Thank you for choosing [facility].  Summary/Instructions:  Your prescription(s) have been submitted to your pharmacy or been printed and provided for you. Please take as directed and contact our office if you believe you are having problem(s) with the medication(s) or have any questions.  Please stop by the lab on the basement level of the building for your blood work. Your results will be released to MyChart (or called to you) after review, usually within 72 hours after test completion. If any changes need to be made, you will be notified at that same time.   

## 2014-08-27 ENCOUNTER — Encounter: Payer: Self-pay | Admitting: Family

## 2014-08-27 ENCOUNTER — Other Ambulatory Visit (INDEPENDENT_AMBULATORY_CARE_PROVIDER_SITE_OTHER): Payer: Medicare HMO

## 2014-08-27 ENCOUNTER — Ambulatory Visit (INDEPENDENT_AMBULATORY_CARE_PROVIDER_SITE_OTHER): Payer: Medicare HMO | Admitting: Family

## 2014-08-27 VITALS — BP 148/72 | HR 81 | Temp 98.3°F | Resp 18 | Ht 67.0 in | Wt 144.0 lb

## 2014-08-27 DIAGNOSIS — E871 Hypo-osmolality and hyponatremia: Secondary | ICD-10-CM

## 2014-08-27 LAB — BASIC METABOLIC PANEL
BUN: 9 mg/dL (ref 6–23)
CHLORIDE: 99 meq/L (ref 96–112)
CO2: 27 mEq/L (ref 19–32)
Calcium: 9.8 mg/dL (ref 8.4–10.5)
Creatinine, Ser: 0.59 mg/dL (ref 0.40–1.20)
GFR: 103.41 mL/min (ref >60.00)
Glucose, Bld: 127 mg/dL — ABNORMAL HIGH (ref 70–99)
Potassium: 4 mEq/L (ref 3.5–5.1)
Sodium: 132 mEq/L — ABNORMAL LOW (ref 135–145)

## 2014-08-27 NOTE — Progress Notes (Signed)
Subjective:    Patient ID: [name], female    DOB: 01-18-1929, 81 y.o.   MRN: [id number]  Chief Complaint  Patient presents with  . Follow-up    sodium was low on lab work was told to stop HCTZ and follow up in a week.    HPI:  [name] is a 81 y.o. female who presents today for follow-up.  Patient was recently seen in the office and was found to have a decreased sodium level. She was instructed to discontinue hydrochlorothiazide.   Indicates she has noted some associated symptoms of mild increased edema in both her ankles (left>right secondary to previous left injury). States that her home blood pressures have been reading in the 120-130's since coming off of the HCTZ.     Allergies  Allergen Reactions  . Augmentin [Amoxicillin-Pot Clavulanate] Other (See Comments)    Gi upset only no rash or hives   . Keflex [Cephalexin] Hives and Nausea Only    Stomach upset  . Prednisone     Other reaction(s): Other Makes blood sugars run high  . Hydrocodone Rash    Rash to chest , side back  . Latex Rash    Powder in gloves causes rash; "not allergic to latex just the powder"    Current Outpatient Prescriptions on File Prior to Visit  Medication Sig Dispense Refill  . ALPRAZolam (XANAX) 0.5 MG tablet Take 1 tablet (0.5 mg total) by mouth daily as needed for anxiety. 30 tablet 0  . aspirin 81 MG tablet Take 81 mg by mouth daily.    . BD PEN NEEDLE NANO U/F 32G X 4 MM MISC   0  . BIOGAIA PROBIOTIC (BIOGAIA PROBIOTIC) LIQD 3oz before breakfast    . Calcium Citrate-Vitamin D (CITRACAL + D PO) Take 1,000 mg by mouth daily.    . chlorhexidine (PERIDEX) 0.12 % solution Swish 15 milliliters well bid over the oral lesions then expectorate 240 mL 0  . cholecalciferol (VITAMIN D) 1000 UNITS tablet Take 1,000 Units by mouth 2 (two) times daily.     . famotidine (PEPCID) 20 MG tablet Take 1 tablet (20 mg total) by mouth 2 (two) times daily.    . fish oil-omega-3 fatty acids 1000  MG capsule Take 2 g by mouth daily.    . fluconazole (DIFLUCAN) 100 MG tablet 2 daily 14 tablet 0  . glucose blood (ONE TOUCH ULTRA TEST) test strip Used to test blood sugars 8 times a day    . insulin aspart (NOVOLOG) 100 UNIT/ML injection Inject into the skin 3 (three) times daily before meals. Pt uses a sliding scale  Determined by the # of carbs eaten.    . insulin glargine (LANTUS) 100 UNIT/ML injection Inject 13 Units into the skin at bedtime.    . IV Sets-Tubing (INFUSION SET 23" COMFORT) MISC 1 Device by Does not apply route every 3 (three) days. 13 mm catheter    . levothyroxine (SYNTHROID, LEVOTHROID) 112 MCG tablet Take 1 tablet (112 mcg total) by mouth daily. 90 tablet 1  . losartan (COZAAR) 50 MG tablet Take 1 tablet (50 mg total) by mouth daily. 90 tablet 3  . Multiple Vitamin (MULTIVITAMIN) tablet Take 1 tablet by mouth daily. For breast and bone health    . ondansetron (ZOFRAN) 4 MG tablet     . ONE TOUCH ULTRA TEST test strip TEST as directed 8 TIMES PER DAY 102 each 3  . simvastatin (ZOCOR) 20 MG tablet  Take 20 mg by mouth at bedtime.     . vitamin C (ASCORBIC ACID) 500 MG tablet Take 1,000 mg by mouth daily.     No current facility-administered medications on file prior to visit.    Review of Systems  Eyes:       Negative for changes in eyesight.  Respiratory: Negative for chest tightness and shortness of breath.   Cardiovascular: Positive for leg swelling. Negative for chest pain and palpitations.  Neurological: Negative for tremors, seizures, weakness, light-headedness and headaches.      Objective:    BP 148/72 mmHg  Pulse 81  Temp(Src) 98.3 F (36.8 C) (Oral)  Resp 18  Ht 5\' 7"  (1.702 m)  Wt 144 lb (65.318 kg)  BMI 22.55 kg/m2  SpO2 95% Nursing note and vital signs reviewed.  Physical Exam  Constitutional: She is oriented to person, place, and time. She appears well-developed and well-nourished. No distress.  Cardiovascular: Normal rate, regular rhythm,  normal heart sounds and intact distal pulses.   Pulmonary/Chest: Effort normal and breath sounds normal.  Musculoskeletal: She exhibits edema (bilateral lower extremities; left > right secondary to ankle injury).  Neurological: She is alert and oriented to person, place, and time.  Skin: Skin is warm and dry.  Psychiatric: She has a normal mood and affect. Her behavior is normal. Judgment and thought content normal.      Assessment & Plan:

## 2014-08-27 NOTE — Assessment & Plan Note (Signed)
Previously presented with hyponatremia. Known history of chronic hyponatremia. Patient has discontinued her hydrochlorothiazide. Recheck basic metabolic panel. No additional medications for blood pressure at this time as patient indicates her blood pressures at home have been reading in the 120s to 130s. Follow up pending lab work.

## 2014-08-27 NOTE — Patient Instructions (Signed)
Thank you for choosing [facility].  Summary/Instructions:  Please stop by the lab on the basement level of the building for your blood work. Your results will be released to [facility] (or called to you) after review, usually within 72 hours after test completion. If any changes need to be made, you will be notified at that same time.  If your symptoms worsen or fail to improve, please contact our office for further instruction, or in case of emergency go directly to the emergency room at the closest medical facility.   Hyponatremia  Hyponatremia is when the amount of salt (sodium) in your blood is too low. When sodium levels are low, your cells will absorb extra water and swell. The swelling happens throughout the body, but it mostly affects the brain. Severe brain swelling (cerebral edema), seizures, or coma can happen.  CAUSES   Heart, kidney, or liver problems.  Thyroid problems.  Adrenal gland problems.  Severe vomiting and diarrhea.  Certain medicines or illegal drugs.  Dehydration.  Drinking too much water.  Low-sodium diet. SYMPTOMS   Nausea and vomiting.  Confusion.  Lethargy.  Agitation.  Headache.  Twitching or shaking (seizures).  Unconsciousness.  Appetite loss.  Muscle weakness and cramping. DIAGNOSIS  Hyponatremia is identified by a simple blood test. Your caregiver will perform a history and physical exam to try to find the cause and type of hyponatremia. Other tests may be needed to measure the amount of sodium in your blood and urine. TREATMENT  Treatment will depend on the cause.   Fluids may be given through the vein (IV).  Medicines may be used to correct the sodium imbalance. If medicines are causing the problem, they will need to be adjusted.  Water or fluid intake may be restricted to restore proper balance. The speed of correcting the sodium problem is very important. If the problem is corrected too fast, nerve damage (sometimes  unchangeable) can happen. HOME CARE INSTRUCTIONS   Only take medicines as directed by your caregiver. Many medicines can make hyponatremia worse. Discuss all your medicines with your caregiver.  Carefully follow any recommended diet, including any fluid restrictions.  You may be asked to repeat lab tests. Follow these directions.  Avoid alcohol and recreational drugs. SEEK MEDICAL CARE IF:   You develop worsening nausea, fatigue, headache, confusion, or weakness.  Your original hyponatremia symptoms return.  You have problems following the recommended diet. SEEK IMMEDIATE MEDICAL CARE IF:   You have a seizure.  You faint.  You have ongoing diarrhea or vomiting. MAKE SURE YOU:   Understand these instructions.  Will watch your condition.  Will get help right away if you are not doing well or get worse. Document Released: 07/17/2002 Document Revised: 10/19/2011 Document Reviewed: 01/11/2011 [facility] Patient Information 2015 [place], [place]. This information is not intended to replace advice given to you by your health care provider. Make sure you discuss any questions you have with your health care provider.

## 2014-08-27 NOTE — Progress Notes (Signed)
Pre visit review using our clinic review tool, if applicable. No additional management support is needed unless otherwise documented below in the visit note. 

## 2014-09-03 ENCOUNTER — Encounter: Payer: Self-pay | Admitting: Internal Medicine

## 2014-09-08 NOTE — Telephone Encounter (Signed)
Yes - ok to call in "full" amount of usual alprazolam with 5 refills on my behalf Thanks!

## 2014-09-11 MED ORDER — ALPRAZOLAM 0.5 MG PO TABS: 0.5000 mg | ORAL_TABLET | Freq: Every day | ORAL | Status: DC | PRN

## 2014-09-26 DIAGNOSIS — R194 Change in bowel habit: Secondary | ICD-10-CM | POA: Insufficient documentation

## 2014-10-29 ENCOUNTER — Other Ambulatory Visit: Payer: Self-pay | Admitting: General Surgery

## 2014-10-29 ENCOUNTER — Other Ambulatory Visit: Payer: Self-pay | Admitting: Hematology and Oncology

## 2014-10-29 DIAGNOSIS — Z853 Personal history of malignant neoplasm of breast: Secondary | ICD-10-CM

## 2014-11-06 ENCOUNTER — Telehealth: Payer: Self-pay | Admitting: Internal Medicine

## 2014-11-06 MED ORDER — LOSARTAN POTASSIUM 50 MG PO TABS: 50.0000 mg | ORAL_TABLET | Freq: Every day | ORAL | Status: DC

## 2014-11-06 MED ORDER — LEVOTHYROXINE SODIUM 112 MCG PO TABS: 112.0000 ug | ORAL_TABLET | Freq: Every day | ORAL | Status: DC

## 2014-11-06 NOTE — Telephone Encounter (Signed)
Patient needing a refill for losartan (COZAAR) 50 MG tablet [[id number]] at the [facility] in [place]

## 2014-11-06 NOTE — Telephone Encounter (Signed)
Vito.Ghee Done and sent to pharmacy on file Pharmacy: [facility] [address] [place]

## 2014-11-12 NOTE — Telephone Encounter (Signed)
Pt called in said that [facility] still doesn't have her script.  Can it be resent?

## 2014-11-12 NOTE — Telephone Encounter (Signed)
There an electronic receipt from pharmacy on 11/06/14.     Disp Refills Start End       losartan (COZAAR) 50 MG tablet 90 tablet 3 11/06/2014      Sig - Route: Take 1 tablet (50 mg total) by mouth daily. - Oral     Notes to Pharmacy: Discontinue all previous refills for this medication. Hold/Fill when appropriate according to pt rx fill schedule.     E-Prescribing Status: Receipt confirmed by pharmacy (11/06/2014 2:14 PM EDT)      Called pharmacy directly.  Pharmacy stated that the rx is there but can not be filled until the 11th of April.  Tried to call pt but both numbers are not in service.  Sending a my chart message.

## 2014-11-26 ENCOUNTER — Ambulatory Visit
Admission: RE | Admit: 2014-11-26 | Discharge: 2014-11-26 | Disposition: A | Discharge status: Home / Self Care | Payer: Medicare PPO | Source: Ambulatory Visit | Attending: Hematology and Oncology | Admitting: Hematology and Oncology

## 2014-11-26 DIAGNOSIS — Z853 Personal history of malignant neoplasm of breast: Secondary | ICD-10-CM

## 2014-11-29 DIAGNOSIS — H25819 Combined forms of age-related cataract, unspecified eye: Secondary | ICD-10-CM | POA: Diagnosis not present

## 2014-11-29 DIAGNOSIS — Z794 Long term (current) use of insulin: Secondary | ICD-10-CM | POA: Diagnosis not present

## 2014-11-29 DIAGNOSIS — H25813 Combined forms of age-related cataract, bilateral: Secondary | ICD-10-CM | POA: Diagnosis not present

## 2014-11-29 DIAGNOSIS — H43819 Vitreous degeneration, unspecified eye: Secondary | ICD-10-CM | POA: Diagnosis not present

## 2014-11-29 DIAGNOSIS — E109 Type 1 diabetes mellitus without complications: Secondary | ICD-10-CM | POA: Diagnosis not present

## 2014-11-29 LAB — HM DIABETES EYE EXAM

## 2014-12-12 ENCOUNTER — Telehealth: Payer: Self-pay

## 2014-12-12 NOTE — Telephone Encounter (Signed)
Patient called to educate on Medicare Wellness apt. LVM for the patient to call back to educate and schedule for wellness visit.   

## 2014-12-14 NOTE — Telephone Encounter (Signed)
Call back and is scheduled to come in for AWV on 5/11 at 2:30

## 2014-12-18 ENCOUNTER — Encounter: Payer: Self-pay | Admitting: Internal Medicine

## 2014-12-19 ENCOUNTER — Telehealth: Payer: Self-pay

## 2014-12-19 ENCOUNTER — Encounter: Payer: Self-pay | Admitting: General Surgery

## 2014-12-19 ENCOUNTER — Telehealth: Payer: Self-pay | Admitting: *Deleted

## 2014-12-19 NOTE — Telephone Encounter (Signed)
[facility] - Client TELEPHONE [facility] Patient Name: [name] Gender: Female DOB: 11-Jul-1929 Age: 81 Y 30 M 11 D Return Phone Number: [phone] (Primary) Address: City/State/Zip: [place] [employer] [facility] - Client Client Site [place] - Night Physician [place], [place] Type Call Caller Name sarahy creedon Phone Number [phone] Relationship To Patient Self Is this call to report lab results? No Call Type General Information Initial Comment caller states needing to cancel appt w/ susan tomorrow, going to call back and reschedule General Information Type Appointment Nurse Assessment Guidelines Guideline Title Affirmed Question Affirmed Notes Nurse Date/Time (Eastern Time) Disp. Time Eilene Ghazi Time) Disposition Final User 12/18/2014 8:52:07 PM General Information Provided Yes Fransico Michael After Care Instructions Given Call Event Type User Date / Time Description

## 2014-12-19 NOTE — Telephone Encounter (Signed)
Call to the patient and stated that she called in at 9pm last night to cancel the apt and was told I would get the message/ Stated  she was going out of town and could not come in today. She will call when she gets back to town to re-schedule her apt.

## 2015-01-08 LAB — LIPID PANEL
Cholesterol: 157 mg/dL (ref 0–200)
HDL: 45 mg/dL (ref 35–70)
LDL Cholesterol: 93 mg/dL
Triglycerides: 96 mg/dL (ref 40–160)

## 2015-01-08 LAB — TSH: TSH: 0.22 u[IU]/mL — AB (ref 0.41–5.90)

## 2015-01-08 LAB — HEMOGLOBIN A1C: Hgb A1c MFr Bld: 7.5 % — AB (ref 4.0–6.0)

## 2015-01-22 ENCOUNTER — Telehealth: Payer: Self-pay

## 2015-01-22 NOTE — Telephone Encounter (Signed)
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 AWV is set up in July with Dr. Asa Lente. Explained that her insurance will cover both the physical and AWV but it is ok to refer to Dr. Asa Lente at her CPE.

## 2015-02-04 ENCOUNTER — Other Ambulatory Visit: Payer: Self-pay

## 2015-02-14 NOTE — Progress Notes (Signed)
This encounter was created in error - please disregard.

## 2015-02-18 ENCOUNTER — Ambulatory Visit (INDEPENDENT_AMBULATORY_CARE_PROVIDER_SITE_OTHER): Payer: Medicare PPO | Admitting: Internal Medicine

## 2015-02-18 ENCOUNTER — Encounter: Payer: Self-pay | Admitting: Internal Medicine

## 2015-02-18 VITALS — BP 122/70 | HR 76 | Temp 98.0°F | Ht 67.0 in | Wt 144.5 lb

## 2015-02-18 DIAGNOSIS — Z23 Encounter for immunization: Secondary | ICD-10-CM

## 2015-02-18 DIAGNOSIS — E034 Atrophy of thyroid (acquired): Secondary | ICD-10-CM

## 2015-02-18 DIAGNOSIS — Z0000 Encounter for general adult medical examination without abnormal findings: Secondary | ICD-10-CM

## 2015-02-18 DIAGNOSIS — I10 Essential (primary) hypertension: Secondary | ICD-10-CM

## 2015-02-18 DIAGNOSIS — E038 Other specified hypothyroidism: Secondary | ICD-10-CM | POA: Diagnosis not present

## 2015-02-18 DIAGNOSIS — E7800 Pure hypercholesterolemia, unspecified: Secondary | ICD-10-CM

## 2015-02-18 DIAGNOSIS — E1043 Type 1 diabetes mellitus with diabetic autonomic (poly)neuropathy: Secondary | ICD-10-CM

## 2015-02-18 MED ORDER — HYDROCHLOROTHIAZIDE 12.5 MG PO CAPS: 12.5000 mg | ORAL_CAPSULE | ORAL | Status: DC

## 2015-02-18 MED ORDER — GABAPENTIN 100 MG PO CAPS: 100.0000 mg | ORAL_CAPSULE | Freq: Three times a day (TID) | ORAL | Status: DC

## 2015-02-18 MED ORDER — ALPRAZOLAM 0.5 MG PO TABS: 0.2500 mg | ORAL_TABLET | Freq: Two times a day (BID) | ORAL | Status: DC | PRN

## 2015-02-18 NOTE — Assessment & Plan Note (Signed)
On statin -  Check annually, titrate as needed

## 2015-02-18 NOTE — Assessment & Plan Note (Signed)
BP Readings from Last 3 Encounters:  02/18/15 122/70  08/27/14 148/72  08/20/14 157/57   DC'd HCTZ 11/2013 because of hyponatremia (which has improved) and started losartan 50mg  qd Pt reports occasional dependent edema during summer months and would like to resume HCTZ We will use low-dose HCTZ 12.5 mg every other day only as needed for swelling balancing risk of recurrent hyponatremia with treatment of cosmetic edema. Patient understands and agrees to same

## 2015-02-18 NOTE — Patient Instructions (Addendum)
It was good to see you today.  We have reviewed your prior records including labs and tests today  Health Maintenance reviewed -Prevnar 13, new pneumonia vaccine updated today. All other recommended immunizations and age-appropriate screenings are up-to-date.  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 Start with 1 capsule at bedtime for 1 week, then increase to 100 mg twice daily for one week then increase to 100 mg 3 times daily -please let us know how you are feeling after 30 days of being on this medication, sooner if worse No other changes recommended at this time.  Your prescription(s) and refills have been submitted to your pharmacy. Please take as directed and contact our office if you believe you are having problem(s) with the medication(s).  Continue working with your other specialists as ongoing and as reviewed today  Please schedule followup in 6 months, call sooner if problems.   Health Maintenance Adopting a healthy lifestyle and getting preventive care can go a long way to promote health and wellness. Talk with your health care provider about what schedule of regular examinations is right for you. This is a good chance for you to check in with your provider about disease prevention and staying healthy. In between checkups, there are plenty of things you can do on your own. Experts have done a lot of research about which lifestyle changes and preventive measures are most likely to keep you healthy. Ask your health care provider for more information. WEIGHT AND DIET  Eat a healthy diet  Be sure to include plenty of vegetables, fruits, low-fat dairy products, and lean protein.  Do not eat a lot of foods high in solid fats, added sugars, or salt.  Get regular exercise. This is one of the most  important things you can do for your health.  Most adults should exercise for at least 150 minutes each week. The exercise should increase your heart rate and make you sweat (moderate-intensity exercise).  Most adults should also do strengthening exercises at least twice a week. This is in addition to the moderate-intensity exercise.  Maintain a healthy weight  Body mass index (BMI) is a measurement that can be used to identify possible weight problems. It estimates body fat based on height and weight. Your health care provider can help determine your BMI and help you achieve or maintain a healthy weight.  For females 43 years of age and older:   A BMI below 18.5 is considered underweight.  A BMI of 18.5 to 24.9 is normal.  A BMI of 25 to 29.9 is considered overweight.  A BMI of 30 and above is considered obese.  Watch levels of cholesterol and blood lipids  You should start having your blood tested for lipids and cholesterol at 79 years of age, then have this test every 5 years.  You may need to have your cholesterol levels checked more often if:  Your lipid or cholesterol levels are high.  You are older than 79 years of age.  You are at high risk for heart disease.  CANCER SCREENING   Lung Cancer  Lung cancer screening is recommended for adults 6-2 years old who are at high risk for lung cancer because of a history of smoking.  A yearly low-dose CT scan of the lungs is recommended for people who:  Currently smoke.  Have quit within the past 15 years.  Have at least a 30-pack-year history of smoking. A pack year is smoking an average of one pack of cigarettes a day for 1 year.  Yearly screening should continue until it has been 15 years since you quit.  Yearly screening should stop if you develop a health problem that would prevent you from having lung cancer treatment.  Breast Cancer  Practice breast self-awareness. This means understanding how your breasts  normally appear and feel.  It also means doing regular breast self-exams. Let your health care provider know about any changes, no matter how small.  If you are in your 20s or 30s, you should have a clinical breast exam (CBE) by a health care provider every 1-3 years as part of a regular health exam.  If you are 49 or older, have a CBE every year. Also consider having a breast X-ray (mammogram) every year.  If you have a family history of breast cancer, talk to your health care provider about genetic screening.  If you are at high risk for breast cancer, talk to your health care provider about having an MRI and a mammogram every year.  Breast cancer gene (BRCA) assessment is recommended for women who have family members with BRCA-related cancers. BRCA-related cancers include:  Breast.  Ovarian.  Tubal.  Peritoneal cancers.  Results of the assessment will determine the need for genetic counseling and BRCA1 and BRCA2 testing. Cervical Cancer Routine pelvic examinations to screen for cervical cancer are no longer recommended for nonpregnant women who are considered low risk for cancer of the pelvic organs (ovaries, uterus, and vagina) and who do not have symptoms. A pelvic examination may be necessary if you have symptoms including those associated with pelvic infections. Ask your health care provider if a screening pelvic exam is right for you.   The Pap test is the screening test for cervical cancer for women who are considered at risk.  If you had a hysterectomy for a problem that was not cancer or a condition that could lead to cancer, then you no longer need Pap tests.  If you are older than 65 years, and you have had normal Pap tests for the past 10 years, you no longer need to have Pap tests.  If you have had past treatment for cervical cancer or a condition that could lead to cancer, you need Pap tests and screening for cancer for at least 20 years after your treatment.  If you  no longer get a Pap test, assess your risk factors if they change (such as having a new sexual partner). This can affect whether you should start being screened again.  Some women have medical problems that increase their chance of getting cervical cancer. If this is the case for you, your health care provider may recommend more frequent screening and Pap tests.  The human papillomavirus (HPV) test is another test that may be used for cervical cancer screening. The HPV test looks for the virus that can cause cell changes in the cervix. The cells collected during the Pap test can be tested for HPV.  The HPV test can be used to screen women 17 years of age and older. Getting tested for HPV can extend the interval between normal Pap tests from three to five years.  An HPV test also should be used to screen women of any age who have unclear Pap test results.  After 79 years of age, women  should have HPV testing as often as Pap tests.  Colorectal Cancer  This type of cancer can be detected and often prevented.  Routine colorectal cancer screening usually begins at 79 years of age and continues through 79 years of age.  Your health care provider may recommend screening at an earlier age if you have risk factors for colon cancer.  Your health care provider may also recommend using home test kits to check for hidden blood in the stool.  A small camera at the end of a tube can be used to examine your colon directly (sigmoidoscopy or colonoscopy). This is done to check for the earliest forms of colorectal cancer.  Routine screening usually begins at age 66.  Direct examination of the colon should be repeated every 5-10 years through 79 years of age. However, you may need to be screened more often if early forms of precancerous polyps or small growths are found. Skin Cancer  Check your skin from head to toe regularly.  Tell your health care provider about any new moles or changes in moles,  especially if there is a change in a mole's shape or color.  Also tell your health care provider if you have a mole that is larger than the size of a pencil eraser.  Always use sunscreen. Apply sunscreen liberally and repeatedly throughout the day.  Protect yourself by wearing long sleeves, pants, a wide-brimmed hat, and sunglasses whenever you are outside. HEART DISEASE, DIABETES, AND HIGH BLOOD PRESSURE   Have your blood pressure checked at least every 1-2 years. High blood pressure causes heart disease and increases the risk of stroke.  If you are between 74 years and 43 years old, ask your health care provider if you should take aspirin to prevent strokes.  Have regular diabetes screenings. This involves taking a blood sample to check your fasting blood sugar level.  If you are at a normal weight and have a low risk for diabetes, have this test once every three years after 79 years of age.  If you are overweight and have a high risk for diabetes, consider being tested at a younger age or more often. PREVENTING INFECTION  Hepatitis B  If you have a higher risk for hepatitis B, you should be screened for this virus. You are considered at high risk for hepatitis B if:  You were born in a country where hepatitis B is common. Ask your health care provider which countries are considered high risk.  Your parents were born in a high-risk country, and you have not been immunized against hepatitis B (hepatitis B vaccine).  You have HIV or AIDS.  You use needles to inject street drugs.  You live with someone who has hepatitis B.  You have had sex with someone who has hepatitis B.  You get hemodialysis treatment.  You take certain medicines for conditions, including cancer, organ transplantation, and autoimmune conditions. Hepatitis C  Blood testing is recommended for:  Everyone born from [birth year] through 1963.  Anyone with known risk factors for hepatitis C. Sexually transmitted  infections (STIs)  You should be screened for sexually transmitted infections (STIs) including gonorrhea and chlamydia if:  You are sexually active and are younger than 79 years of age.  You are older than 79 years of age and your health care provider tells you that you are at risk for this type of infection.  Your sexual activity has changed since you were last screened and you are at an increased  risk for chlamydia or gonorrhea. Ask your health care provider if you are at risk.  If you do not have HIV, but are at risk, it may be recommended that you take a prescription medicine daily to prevent HIV infection. This is called pre-exposure prophylaxis (PrEP). You are considered at risk if:  You are sexually active and do not regularly use condoms or know the HIV status of your partner(s).  You take drugs by injection.  You are sexually active with a partner who has HIV. Talk with your health care provider about whether you are at high risk of being infected with HIV. If you choose to begin PrEP, you should first be tested for HIV. You should then be tested every 3 months for as long as you are taking PrEP.  PREGNANCY   If you are premenopausal and you may become pregnant, ask your health care provider about preconception counseling.  If you may become pregnant, take 400 to 800 micrograms (mcg) of folic acid every day.  If you want to prevent pregnancy, talk to your health care provider about birth control (contraception). OSTEOPOROSIS AND MENOPAUSE   Osteoporosis is a disease in which the bones lose minerals and strength with aging. This can result in serious bone fractures. Your risk for osteoporosis can be identified using a bone density scan.  If you are 74 years of age or older, or if you are at risk for osteoporosis and fractures, ask your health care provider if you should be screened.  Ask your health care provider whether you should take a calcium or vitamin D supplement to  lower your risk for osteoporosis.  Menopause may have certain physical symptoms and risks.  Hormone replacement therapy may reduce some of these symptoms and risks. Talk to your health care provider about whether hormone replacement therapy is right for you.  HOME CARE INSTRUCTIONS   Schedule regular health, dental, and eye exams.  Stay current with your immunizations.   Do not use any tobacco products including cigarettes, chewing tobacco, or electronic cigarettes.  If you are pregnant, do not drink alcohol.  If you are breastfeeding, limit how much and how often you drink alcohol.  Limit alcohol intake to no more than 1 drink per day for nonpregnant women. One drink equals 12 ounces of beer, 5 ounces of wine, or 1 ounces of hard liquor.  Do not use street drugs.  Do not share needles.  Ask your health care provider for help if you need support or information about quitting drugs.  Tell your health care provider if you often feel depressed.  Tell your health care provider if you have ever been abused or do not feel safe at home. Document Released: 02/09/2011 Document Revised: 12/11/2013 Document Reviewed: 06/28/2013 [facility] Patient Information 2015 [place], [place]. This information is not intended to replace advice given to you by your health care provider. Make sure you discuss any questions you have with your health care provider.

## 2015-02-18 NOTE — Assessment & Plan Note (Addendum)
Alternates between insulin pump since 2007 and SQ insulin follows with endo @ [facility] to manage same (but notes difficult drive to [facility] at her age) changed from "pump" back to SQ insulin 01/2014 - interval reviewed Denies complications of hypo-or hyperglycemia Persistent IBS abdominal cramping suggestive of autonomic neuropathy so will initiate gabapentin - erx done Also sees optho annually On ASA, statin  Lab Results  Component Value Date   HGBA1C 7.5* 01/08/2015

## 2015-02-18 NOTE — Progress Notes (Signed)
Subjective:    Patient ID: [name], female    DOB: 05-16-1929, 81 y.o.   MRN: [id number]  HPI   Here for medicare wellness and annual exam  Diet: heart healthy and low carb/diabetic Physical activity: sedentary Depression/mood screen: negative Hearing: intact to whispered voice Visual acuity: grossly normal, performs annual eye exam  ADLs: capable Fall risk: none Home safety: good Cognitive evaluation: intact to orientation, naming, recall and repetition EOL planning: adv directives, full code/ I agree  I have personally reviewed and have noted 1. The patient's medical and social history 2. Their use of alcohol, tobacco or illicit drugs 3. Their current medications and supplements 4. The patient's functional ability including ADL's, fall risks, home safety risks and hearing or visual impairment. 5. Diet and physical activities 6. Evidence for depression or mood disorders  Also reviewed chronic conditions, interval events and current concerns  Past Medical History  Diagnosis Date  . Hyponatremia   . Hypothyroidism   . Diabetes mellitus     type wears insulin pump  . GERD (gastroesophageal reflux disease)   . Arthritis     fingers  . Hypothyroid   . Hypercholesteremia   . Hypertension   . Diabetes mellitus type 1     type I- wears insulin pump  . Osteoarthritis   . PAF (paroxysmal atrial fibrillation)     One episode in 2008, spontaneously converted in the ED, no further treatment  . Thyroid nodule dx 07/2011    [place] q 51moto follow calcified L thyroid nodule (12/08/11 [place]  . Osteoporosis, post-menopausal     DEXA 12/02/11: -3.5 (with lomax gyn), declines bisphos due to bone pain  . Spondylosis     thoracic spine  . Cancer of upper-outer quadrant of female breast 08/21/2011    ER +  PR +  Her 2 -  Ki67 9%   0.9 cm invasive lobular  Carcinoma ,s/p central lumpectomy with sentinel node biopsy,  ER/PR positive s/p re-excion on 09/16/11 with final pathology showing  atypical hyperplasia    Family History  Problem Relation Age of Onset  . Cancer Sister     unknown  . Hypertension Mother   . Stroke Mother   . Arthritis Father   . Heart disease Father   . Diabetes Son    History  Substance Use Topics  . Smoking status: Former Smoker -- 1.00 packs/day for 10 years    Types: Cigarettes    Quit date: 08/27/1977  . Smokeless tobacco: Never Used  . Alcohol Use: Yes     Comment: rare   Review of Systems  Constitutional: Negative for fever, fatigue and unexpected weight change.  Respiratory: Negative for cough, shortness of breath and wheezing.   Cardiovascular: Negative for chest pain, palpitations and leg swelling.  Gastrointestinal: Positive for diarrhea (chronically loose) and abdominal distention ("bloating and cramping" constant since ischemic attack 2015 - ?[facility]. Negative for nausea, abdominal pain, blood in stool and anal bleeding.  Neurological: Negative for dizziness, weakness, light-headedness and headaches.  Psychiatric/Behavioral: Negative for dysphoric mood. The patient is not nervous/anxious.   All other systems reviewed and are negative.   Patient Care Team: VRowe Clack MD as PCP - General (Internal Medicine) MRolm Bookbinder MD (General Surgery) LLonna Duval MD (Endocrinology) SMarica Otter OD (Optometry)     Objective:    Physical Exam  Constitutional: She appears well-developed and well-nourished. No distress.  Cardiovascular: Normal rate, regular rhythm and normal  heart sounds.   No murmur heard. Pulmonary/Chest: Effort normal and breath sounds normal. No respiratory distress.  Abdominal: Soft. Bowel sounds are normal. She exhibits no distension. There is no tenderness. There is no rebound and no guarding.  Musculoskeletal: She exhibits no edema.    BP 122/70 mmHg  Pulse 76  Temp(Src) 98 F (36.7 C) (Oral)  Ht _0  (1.702 m)  Wt 144 lb 8 oz (65.545 kg)  BMI 22.63 kg/m2  SpO2 93% Wt  Readings from Last 3 Encounters:  02/18/15 144 lb 8 oz (65.545 kg)  08/27/14 144 lb (65.318 kg)  08/20/14 143 lb 1.6 oz (64.91 kg)    Lab Results  Component Value Date   WBC 7.3 08/20/2014   HGB 12.6 08/20/2014   HCT 37.9 08/20/2014   PLT 224 08/20/2014   GLUCOSE 127* 08/27/2014   CHOL 157 01/08/2015   TRIG 96 01/08/2015   HDL 45 01/08/2015   LDLCALC 93 01/08/2015   ALT 17 08/20/2014   AST 29 08/20/2014   NA 132* 08/27/2014   K 4.0 08/27/2014   CL 99 08/27/2014   CREATININE 0.59 08/27/2014   BUN 9 08/27/2014   CO2 27 08/27/2014   TSH 0.22* 01/08/2015   INR 1.16 03/18/2012   HGBA1C 7.5* 01/08/2015    Mm Diag Breast Tomo Bilateral  11/26/2014   CLINICAL DATA:  History of malignant lumpectomy of the left breast in 2013. Annual re-evaluation.  EXAM: DIGITAL DIAGNOSTIC bilateral MAMMOGRAM WITH 3D TOMOSYNTHESIS AND CAD  COMPARISON:  Previous examinations the most recent of which is dated 10/18/2013.  ACR Breast Density Category c: The breast tissue is heterogeneously dense, which may obscure small masses.  FINDINGS: There are stable scarring changes within the subareolar left breast related to the patient's lumpectomy. There is no specific evidence for recurrent tumor or developing malignancy within either breast.  Mammographic images were processed with CAD.  IMPRESSION: Stable breast parenchymal pattern. No findings worrisome for recurrent tumor or developing malignancy.  RECOMMENDATION: Bilateral diagnostic mammography in 1 year.  I have discussed the findings and recommendations with the patient. Results were also provided in writing at the conclusion of the visit. If applicable, a reminder letter will be sent to the patient regarding the next appointment.  BI-RADS CATEGORY  1: Negative.   Electronically Signed   By: Altamese Cabal M.D.   On: 11/26/2014 13:10       Assessment & Plan:   AWV/z00.00 - Today patient counseled on age appropriate routine health concerns for screening  and prevention, each reviewed and up to date or declined. Immunizations reviewed and up to date or declined. Labs reviewed. Risk factors for depression reviewed and negative. Hearing function and visual acuity are intact. ADLs screened and addressed as needed. Functional ability and level of safety reviewed and appropriate. Education, counseling and referrals performed based on assessed risks today. Patient provided with a copy of personalized plan for preventive services.  Problem List Items Addressed This Visit    Diabetes mellitus type 1 - Primary    Alternates between insulin pump since 2007 and SQ insulin follows with endo @ [facility] to manage same (but notes difficult drive to [facility] at her age) changed from "pump" back to SQ insulin 01/2014 - interval reviewed Denies complications of hypo-or hyperglycemia Persistent IBS abdominal cramping suggestive of autonomic neuropathy so will initiate gabapentin - erx done Also sees optho annually On ASA, statin  Lab Results  Component Value Date   HGBA1C 7.5* 01/08/2015  Relevant Medications   insulin aspart (NOVOLOG FLEXPEN) 100 UNIT/ML FlexPen   Insulin Glargine (LANTUS SOLOSTAR) 100 UNIT/ML Solostar Pen   Hypercholesteremia    On statin -  Check annually, titrate as needed      Relevant Medications   hydrochlorothiazide (MICROZIDE) 12.5 MG capsule   Hypertension    BP Readings from Last 3 Encounters:  02/18/15 122/70  08/27/14 148/72  08/20/14 157/57   DC'd HCTZ 11/2013 because of hyponatremia (which has improved) and started losartan 44m qd Pt reports occasional dependent edema during summer months and would like to resume HCTZ We will use low-dose HCTZ 12.5 mg every other day only as needed for swelling balancing risk of recurrent hyponatremia with treatment of cosmetic edema. Patient understands and agrees to same       Relevant Medications   hydrochlorothiazide (MICROZIDE) 12.5 MG capsule   Hypothyroid    On medication  replacment for years, varying doses reviewed Follows with endo for same Interval reviewed  Lab Results  Component Value Date   TSH 0.22* 01/08/2015            VGwendolyn Grant MD

## 2015-02-18 NOTE — Assessment & Plan Note (Signed)
On medication replacment for years, varying doses reviewed Follows with endo for same Interval reviewed  Lab Results  Component Value Date   TSH 0.22* 01/08/2015

## 2015-02-18 NOTE — Progress Notes (Signed)
Pre visit review using our clinic review tool, if applicable. No additional management support is needed unless otherwise documented below in the visit note. 

## 2015-02-19 DIAGNOSIS — H53001 Unspecified amblyopia, right eye: Secondary | ICD-10-CM | POA: Diagnosis not present

## 2015-02-19 DIAGNOSIS — H18413 Arcus senilis, bilateral: Secondary | ICD-10-CM | POA: Diagnosis not present

## 2015-02-19 DIAGNOSIS — H2512 Age-related nuclear cataract, left eye: Secondary | ICD-10-CM | POA: Diagnosis not present

## 2015-02-19 DIAGNOSIS — E119 Type 2 diabetes mellitus without complications: Secondary | ICD-10-CM | POA: Diagnosis not present

## 2015-02-19 DIAGNOSIS — I10 Essential (primary) hypertension: Secondary | ICD-10-CM | POA: Diagnosis not present

## 2015-02-26 DIAGNOSIS — E109 Type 1 diabetes mellitus without complications: Secondary | ICD-10-CM | POA: Diagnosis not present

## 2015-03-01 DIAGNOSIS — H00012 Hordeolum externum right lower eyelid: Secondary | ICD-10-CM | POA: Diagnosis not present

## 2015-03-20 DIAGNOSIS — R109 Unspecified abdominal pain: Secondary | ICD-10-CM | POA: Diagnosis not present

## 2015-03-20 DIAGNOSIS — R197 Diarrhea, unspecified: Secondary | ICD-10-CM | POA: Diagnosis not present

## 2015-03-20 DIAGNOSIS — Z79899 Other long term (current) drug therapy: Secondary | ICD-10-CM | POA: Diagnosis not present

## 2015-03-20 DIAGNOSIS — R140 Abdominal distension (gaseous): Secondary | ICD-10-CM | POA: Diagnosis not present

## 2015-03-25 DIAGNOSIS — L821 Other seborrheic keratosis: Secondary | ICD-10-CM | POA: Diagnosis not present

## 2015-03-25 DIAGNOSIS — L820 Inflamed seborrheic keratosis: Secondary | ICD-10-CM | POA: Diagnosis not present

## 2015-03-25 DIAGNOSIS — L578 Other skin changes due to chronic exposure to nonionizing radiation: Secondary | ICD-10-CM | POA: Diagnosis not present

## 2015-03-25 DIAGNOSIS — C4441 Basal cell carcinoma of skin of scalp and neck: Secondary | ICD-10-CM | POA: Diagnosis not present

## 2015-03-29 DIAGNOSIS — K432 Incisional hernia without obstruction or gangrene: Secondary | ICD-10-CM | POA: Diagnosis not present

## 2015-03-29 DIAGNOSIS — C50912 Malignant neoplasm of unspecified site of left female breast: Secondary | ICD-10-CM | POA: Diagnosis not present

## 2015-03-29 DIAGNOSIS — R197 Diarrhea, unspecified: Secondary | ICD-10-CM | POA: Diagnosis not present

## 2015-04-16 ENCOUNTER — Encounter: Payer: Self-pay | Admitting: Internal Medicine

## 2015-04-16 ENCOUNTER — Ambulatory Visit (INDEPENDENT_AMBULATORY_CARE_PROVIDER_SITE_OTHER): Payer: Medicare PPO | Admitting: Internal Medicine

## 2015-04-16 VITALS — BP 172/78 | HR 89 | Temp 97.9°F | Resp 20 | Wt 145.0 lb

## 2015-04-16 DIAGNOSIS — R51 Headache: Secondary | ICD-10-CM | POA: Diagnosis not present

## 2015-04-16 DIAGNOSIS — J0100 Acute maxillary sinusitis, unspecified: Secondary | ICD-10-CM

## 2015-04-16 DIAGNOSIS — R519 Headache, unspecified: Secondary | ICD-10-CM

## 2015-04-16 DIAGNOSIS — I10 Essential (primary) hypertension: Secondary | ICD-10-CM | POA: Diagnosis not present

## 2015-04-16 MED ORDER — AZITHROMYCIN 250 MG PO TABS: ORAL_TABLET | ORAL | Status: DC

## 2015-04-16 NOTE — Patient Instructions (Addendum)
Plain Mucinex (NOT D) for thick secretions ;force NON dairy fluids .   Nasal cleansing in the shower as discussed with lather of mild shampoo.After 10 seconds wash off lather while  exhaling through nostrils. Make sure that all residual soap is removed to prevent irritation.  Flonase OR Nasacort AQ 1 spray in each nostril twice a day as needed. Use the "crossover" technique into opposite nostril spraying toward opposite ear @ 45 degree angle, not straight up into nostril.  Plain Allegra (NOT D )  160 daily , Loratidine 10 mg , OR Zyrtec 10 mg @ bedtime  as needed for itchy eyes & sneezing.  Decongestants such as Claritin-D can raise your blood pressure and cause cardiac irregularities. Discontinue this immediately and monitor blood pressure. If you blood pressure is over 140/90 on average; take this losartan 50 mg twice a day.  Minimal Blood Pressure Goal= AVERAGE < 140/90;  Ideal is an AVERAGE < 135/85. This AVERAGE should be calculated from @ least 5-7 BP readings taken @ different times of day on different days of week. You should not respond to isolated BP readings , but rather the AVERAGE for that week .Please bring your  blood pressure cuff to office visits to verify that it is reliable.It  can also be checked against the blood pressure device at the pharmacy. Finger or wrist cuffs are not dependable; an arm cuff is.

## 2015-04-16 NOTE — Progress Notes (Signed)
   Subjective:    Patient ID: [name], female    DOB: 1929-06-17, 81 y.o.   MRN: [id number]  HPI  Her symptoms began 04/13/15 as pressure around the eyes and in both maxillary sinus areas. She also had right ear pain and associated nasal congestion.  She describes discomfort at this time in both the frontal and maxillary sinus areas associated with postnasal drainage. She has yellow discharge greater in volume from the head than the chest.  The headache is described as sharp and lasting hours. She does describe photosensitivity for which she's been wearing sunglasses. The headache radiates from the retro-orbital areas and frontal sinus area to the occiput. She's been taking Claritin-D without benefit as well as extra strength Tylenol which does help the headaches.  She had some double vision 04/14/15 church with some discomfort in the left eye. She is being treated by Dr. Tommy Rainwater for dry eyes with "antibiotic eyedrops" as a prelude to cataract surgery. She's been off these for 2 weeks.  Today she's noted tinnitus in her ears.  She describes some weakness in her arms and legs.  She is emphatic that only a Z-Pak and treat her symptoms.  Review of Systems She denies excessive tearing of eyes. She has no sensitivity to sound. There's been no sudden hearing loss associated with the tinnitus. She has no associated numbness or tingling in extremities. There has been no rash or change in color or temperature of the skin in the area of the headache. She denies abnormal bruising or bleeding. The cough is not associated with shortness of breath or wheezing.  She states that her blood pressure yesterday was 110/68.    Objective:   Physical Exam Cranial nerve exam is unremarkable. Strength testing to opposition is excellent. Hyponasal speech pattern is present. Complete dentures are noted. Wax is present both otic canals. Deep tendon reflexes are 0-1/2+ at the knees. She has very marked DIP/PIP  arthritic changes.  General appearance: Appears younger than stated age. Adequately nourished; no acute distress or increased work of breathing is present.    Lymphatic: No  lymphadenopathy about the head, neck, or axilla .  Eyes: No conjunctival inflammation or lid edema is present. There is no scleral icterus. EOM and FOV intact.  Ears:  External ear exam shows no significant lesions or deformities.    Nose:  External nasal examination shows no deformity or inflammation. Nasal mucosa are erythematous without lesions or exudates No septal dislocation or deviation.No obstruction to airflow.   Oral exam: lips and gums are healthy appearing.There is no oropharyngeal erythema or exudate .  Neck:  No deformities, thyromegaly, masses, or tenderness noted.   Supple with full range of motion without pain.   Heart:  Normal rate and regular rhythm. S1 and S2 normal without gallop, murmur, click, rub or other extra sounds.   Lungs:Chest clear to auscultation; no wheezes, rhonchi,rales ,or rubs present.  Extremities:  No cyanosis, edema, or clubbing  noted    Skin: Warm & dry w/o tenting or jaundice. No significant lesions or rash.        Assessment & Plan:  #1 maxillary sinusitis  #2 elevated blood pressure in the context of decongestant use  #3 headache most likely related to #1 and #2. Migraine is not suggested.No associated neurologic deficit present.  Plan: See orders and recommendations

## 2015-04-16 NOTE — Progress Notes (Signed)
Pre visit review using our clinic review tool, if applicable. No additional management support is needed unless otherwise documented below in the visit note. 

## 2015-04-24 DIAGNOSIS — H16222 Keratoconjunctivitis sicca, not specified as Sjogren's, left eye: Secondary | ICD-10-CM | POA: Diagnosis not present

## 2015-04-30 DIAGNOSIS — D044 Carcinoma in situ of skin of scalp and neck: Secondary | ICD-10-CM | POA: Diagnosis not present

## 2015-05-14 DIAGNOSIS — E039 Hypothyroidism, unspecified: Secondary | ICD-10-CM | POA: Diagnosis not present

## 2015-05-14 DIAGNOSIS — E109 Type 1 diabetes mellitus without complications: Secondary | ICD-10-CM | POA: Diagnosis not present

## 2015-05-28 DIAGNOSIS — R197 Diarrhea, unspecified: Secondary | ICD-10-CM | POA: Diagnosis not present

## 2015-05-28 DIAGNOSIS — K5900 Constipation, unspecified: Secondary | ICD-10-CM | POA: Diagnosis not present

## 2015-05-28 DIAGNOSIS — K591 Functional diarrhea: Secondary | ICD-10-CM | POA: Diagnosis not present

## 2015-07-11 DIAGNOSIS — E039 Hypothyroidism, unspecified: Secondary | ICD-10-CM | POA: Diagnosis not present

## 2015-07-11 DIAGNOSIS — I10 Essential (primary) hypertension: Secondary | ICD-10-CM | POA: Diagnosis not present

## 2015-07-11 DIAGNOSIS — E7800 Pure hypercholesterolemia, unspecified: Secondary | ICD-10-CM | POA: Diagnosis not present

## 2015-07-11 DIAGNOSIS — R197 Diarrhea, unspecified: Secondary | ICD-10-CM | POA: Diagnosis not present

## 2015-07-11 DIAGNOSIS — K52832 Lymphocytic colitis: Secondary | ICD-10-CM | POA: Diagnosis not present

## 2015-07-11 DIAGNOSIS — Z888 Allergy status to other drugs, medicaments and biological substances status: Secondary | ICD-10-CM | POA: Diagnosis not present

## 2015-07-11 DIAGNOSIS — Z853 Personal history of malignant neoplasm of breast: Secondary | ICD-10-CM | POA: Diagnosis not present

## 2015-07-11 DIAGNOSIS — E119 Type 2 diabetes mellitus without complications: Secondary | ICD-10-CM | POA: Diagnosis not present

## 2015-07-11 DIAGNOSIS — Z885 Allergy status to narcotic agent status: Secondary | ICD-10-CM | POA: Diagnosis not present

## 2015-07-11 DIAGNOSIS — Z794 Long term (current) use of insulin: Secondary | ICD-10-CM | POA: Diagnosis not present

## 2015-07-11 HISTORY — PX: COLONOSCOPY: SHX174

## 2015-07-22 DIAGNOSIS — H2511 Age-related nuclear cataract, right eye: Secondary | ICD-10-CM | POA: Diagnosis not present

## 2015-07-22 DIAGNOSIS — H25012 Cortical age-related cataract, left eye: Secondary | ICD-10-CM | POA: Diagnosis not present

## 2015-07-22 DIAGNOSIS — H16222 Keratoconjunctivitis sicca, not specified as Sjogren's, left eye: Secondary | ICD-10-CM | POA: Diagnosis not present

## 2015-07-22 DIAGNOSIS — H2512 Age-related nuclear cataract, left eye: Secondary | ICD-10-CM | POA: Diagnosis not present

## 2015-07-27 ENCOUNTER — Other Ambulatory Visit: Payer: Self-pay | Admitting: Internal Medicine

## 2015-08-21 ENCOUNTER — Ambulatory Visit: Payer: Medicare PPO | Admitting: Internal Medicine

## 2015-08-23 ENCOUNTER — Other Ambulatory Visit: Payer: Self-pay

## 2015-08-23 DIAGNOSIS — C50412 Malignant neoplasm of upper-outer quadrant of left female breast: Secondary | ICD-10-CM

## 2015-08-26 ENCOUNTER — Other Ambulatory Visit (HOSPITAL_BASED_OUTPATIENT_CLINIC_OR_DEPARTMENT_OTHER): Payer: Medicare Other

## 2015-08-26 ENCOUNTER — Ambulatory Visit (HOSPITAL_BASED_OUTPATIENT_CLINIC_OR_DEPARTMENT_OTHER): Payer: Medicare Other | Admitting: Hematology and Oncology

## 2015-08-26 ENCOUNTER — Telehealth: Payer: Self-pay | Admitting: Hematology and Oncology

## 2015-08-26 VITALS — BP 179/76 | HR 86 | Temp 97.7°F | Resp 18 | Wt 148.9 lb

## 2015-08-26 DIAGNOSIS — C50412 Malignant neoplasm of upper-outer quadrant of left female breast: Secondary | ICD-10-CM | POA: Diagnosis not present

## 2015-08-26 LAB — COMPREHENSIVE METABOLIC PANEL
ALK PHOS: 89 U/L (ref 40–150)
ALT: 15 U/L (ref 0–55)
AST: 26 U/L (ref 5–34)
Albumin: 4.1 g/dL (ref 3.5–5.0)
Anion Gap: 9 mEq/L (ref 3–11)
BUN: 13.9 mg/dL (ref 7.0–26.0)
CALCIUM: 9.9 mg/dL (ref 8.4–10.4)
CO2: 25 mEq/L (ref 22–29)
Chloride: 102 mEq/L (ref 98–109)
Creatinine: 0.8 mg/dL (ref 0.6–1.1)
EGFR: 70 mL/min/{1.73_m2} — AB (ref >90)
Glucose: 122 mg/dl (ref 70–140)
POTASSIUM: 4 meq/L (ref 3.5–5.1)
Sodium: 135 mEq/L — ABNORMAL LOW (ref 136–145)
Total Bilirubin: 0.52 mg/dL (ref 0.20–1.20)
Total Protein: 7.9 g/dL (ref 6.4–8.3)

## 2015-08-26 LAB — CBC WITH DIFFERENTIAL/PLATELET
BASO%: 0.7 % (ref 0.0–2.0)
Basophils Absolute: 0.1 10*3/uL (ref 0.0–0.1)
EOS ABS: 0.2 10*3/uL (ref 0.0–0.5)
EOS%: 2.7 % (ref 0.0–7.0)
HCT: 39.2 % (ref 34.8–46.6)
HGB: 13.3 g/dL (ref 11.6–15.9)
LYMPH%: 29.7 % (ref 14.0–49.7)
MCH: 30.6 pg (ref 25.1–34.0)
MCHC: 33.9 g/dL (ref 31.5–36.0)
MCV: 90.2 fL (ref 79.5–101.0)
MONO#: 0.9 10*3/uL (ref 0.1–0.9)
MONO%: 11.7 % (ref 0.0–14.0)
NEUT#: 4.3 10*3/uL (ref 1.5–6.5)
NEUT%: 55.2 % (ref 38.4–76.8)
PLATELETS: 181 10*3/uL (ref 145–400)
RBC: 4.35 10*6/uL (ref 3.70–5.45)
RDW: 12.6 % (ref 11.2–14.5)
WBC: 7.8 10*3/uL (ref 3.9–10.3)
lymph#: 2.3 10*3/uL (ref 0.9–3.3)

## 2015-08-26 NOTE — Telephone Encounter (Signed)
Appointments made and avs pritend for pateint °

## 2015-08-26 NOTE — Assessment & Plan Note (Signed)
Left lumpectomy: 0.9 cm invasive lobular  Carcinoma ,s/p central lumpectomy with sentinel node biopsy,  ER/PR positive s/p re-excion on 09/16/11 with final pathology showing atypical hyperplasia ER positive PR positive HER-2 negative Ki-67 9% T1 cN0 M0 stage Ia pathologic staging Could not tolerate tamoxifen, refused radiation  Breast Cancer Surveillance: 1. Breast exam 08/26/2015: Normal 2. Mammogram 11/26/2014 No abnormalities. Postsurgical changes. Breast Density Category C. I recommended that she get 3-D mammograms for surveillance. Discussed the differences between different breast density categories.   Return to clinic in 1 year with survivorship clinic

## 2015-08-26 NOTE — Progress Notes (Signed)
Patient Care Team: Rowe Clack, MD as PCP - General (Internal Medicine) Rolm Bookbinder, MD (General Surgery) Lonna Duval, MD (Endocrinology) Marica Otter, OD (Optometry)  DIAGNOSIS: Primary cancer of upper outer quadrant of left female breast [facility])   Staging form: Breast, AJCC 7th Edition     Clinical: Stage IIA (Free text: T1cN0M0) - Signed by Rolm Bookbinder, MD on 08/26/2011       Staging comments: Clinically Staged in Breast Conference 08/26/11      Pathologic: Stage IA (T1b, N0, cM0) - Signed by Rolm Bookbinder, MD on 09/24/2011   SUMMARY OF ONCOLOGIC HISTORY:   Primary cancer of upper outer quadrant of left female breast ([place])   09/02/2011 Surgery Left lumpectomy: grade 1 lobular carcinoma measuring 0.9 cm with LCIS Medial and superior margins were focally positive, reexcision margins negative, 0/1 sentinel node, ER/PR positive HER-2 negative   09/25/2011 - 01/07/2012 Anti-estrogen oral therapy Patient could not tolerate tamoxifen, also did not receive radiation at [facility] because of coronary artery disease and patient stopped after one radiation treatment    CHIEF COMPLIANT: Surveillance of breast cancer  INTERVAL HISTORY: NICHELE SLAWSON is a 82 year old with above-mentioned history of left breast cancer treated with lumpectomy and she could not receive radiation or antiestrogen pill and is currently on surveillance. She appears to be doing quite well apart from a bout of surgery on the small bowel last year. She is still strains to go to bowel movements and that had led to umbilical hernia. Does not report any pain or discomfort in the breast.  REVIEW OF SYSTEMS:   Constitutional: Denies fevers, chills or abnormal weight loss Eyes: Denies blurriness of vision Ears, nose, mouth, throat, and face: Denies mucositis or sore throat Respiratory: Denies cough, dyspnea or wheezes Cardiovascular: Denies palpitation, chest discomfort Gastrointestinal:  Denies  nausea, heartburn or change in bowel habits Skin: Denies abnormal skin rashes Lymphatics: Denies new lymphadenopathy or easy bruising Neurological:Denies numbness, tingling or new weaknesses Behavioral/Psych: Mood is stable, no new changes  Extremities: No lower extremity edema Breast:  denies any pain or lumps or nodules in either breasts All other systems were reviewed with the patient and are negative.  I have reviewed the past medical history, past surgical history, social history and family history with the patient and they are unchanged from previous note.  ALLERGIES:  is allergic to augmentin; keflex; prednisone; hydrocodone; and latex.  MEDICATIONS:  Current Outpatient Prescriptions  Medication Sig Dispense Refill  . ALPRAZolam (XANAX) 0.5 MG tablet Take 0.5 tablets (0.25 mg total) by mouth 2 (two) times daily as needed for anxiety or sleep. (Patient taking differently: Take 0.25 mg by mouth daily as needed for anxiety or sleep. ) 30 tablet 5  . aspirin 81 MG tablet Take 81 mg by mouth daily.    . Calcium Citrate-Vitamin D (CITRACAL + D PO) Take 1,000 mg by mouth daily.    . cholecalciferol (VITAMIN D) 1000 UNITS tablet Take 1,000 Units by mouth 2 (two) times daily.     . fish oil-omega-3 fatty acids 1000 MG capsule Take 2 g by mouth daily.    [name] glucose blood (ONE TOUCH ULTRA TEST) test strip Used to test blood sugars 8 times a day    . insulin aspart (NOVOLOG FLEXPEN) 100 UNIT/ML FlexPen 1 unit for every 6 grams/CHO  Breakfast, 1 unit for every 10 grams/CHO lunch, 1 unit for every 8 grams/CHO supperCorrection glucose-120/60 at meals    . Insulin Glargine (LANTUS  SOLOSTAR) 100 UNIT/ML Solostar Pen Inject 16 Units into the skin.    [name] levothyroxine (SYNTHROID, LEVOTHROID) 112 MCG tablet Take 1 tablet (112 mcg total) by mouth daily. Must establish with NEW PCP for additional refills. 90 tablet 1  . losartan (COZAAR) 50 MG tablet Take 1 tablet (50 mg total) by mouth daily. 90 tablet 3    . Multiple Vitamin (MULTIVITAMIN) tablet Take 1 tablet by mouth daily. For breast and bone health    . simvastatin (ZOCOR) 20 MG tablet Take 20 mg by mouth at bedtime.     . vitamin C (ASCORBIC ACID) 500 MG tablet Take 1,000 mg by mouth daily.     No current facility-administered medications for this visit.    PHYSICAL EXAMINATION: ECOG PERFORMANCE STATUS: 1 - Symptomatic but completely ambulatory  Filed Vitals:   08/26/15 1414  BP: 179/76  Pulse: 86  Temp: 97.7 F (36.5 C)  Resp: 18   Filed Weights   08/26/15 1414  Weight: 148 lb 14.4 oz (67.541 kg)    GENERAL:alert, no distress and comfortable SKIN: skin color, texture, turgor are normal, no rashes or significant lesions EYES: normal, Conjunctiva are pink and non-injected, sclera clear OROPHARYNX:no exudate, no erythema and lips, buccal mucosa, and tongue normal  NECK: supple, thyroid normal size, non-tender, without nodularity LYMPH:  no palpable lymphadenopathy in the cervical, axillary or inguinal LUNGS: clear to auscultation and percussion with normal breathing effort HEART: regular rate & rhythm and no murmurs and no lower extremity edema ABDOMEN:abdomen soft, non-tender and normal bowel sounds MUSCULOSKELETAL:no cyanosis of digits and no clubbing  NEURO: alert & oriented x 3 with fluent speech, no focal motor/sensory deficits EXTREMITIES: No lower extremity edema BREAST: No palpable masses or nodules in either right or left breasts. No palpable axillary supraclavicular or infraclavicular adenopathy no breast tenderness or nipple discharge. (exam performed in the presence of a chaperone)  LABORATORY DATA:  I have reviewed the data as listed   Chemistry      Component Value Date/Time   NA 132* 08/27/2014 1426   NA 128* 08/20/2014 1346   NA 137 06/16/2011   K 4.0 08/27/2014 1426   K 3.7 08/20/2014 1346   CL 99 08/27/2014 1426   CL 98 09/28/2012 1354   CO2 27 08/27/2014 1426   CO2 27 08/20/2014 1346   BUN 9  08/27/2014 1426   BUN 11.8 08/20/2014 1346   BUN 9 06/16/2011   CREATININE 0.59 08/27/2014 1426   CREATININE 0.8 08/20/2014 1346   CREATININE 0.61 09/08/2013 0938   CREATININE 0.6 06/16/2011   GLU 102 06/16/2011      Component Value Date/Time   CALCIUM 9.8 08/27/2014 1426   CALCIUM 9.6 08/20/2014 1346   ALKPHOS 101 08/20/2014 1346   ALKPHOS 86 08/20/2014 1016   AST 29 08/20/2014 1346   AST 31 08/20/2014 1016   ALT 17 08/20/2014 1346   ALT 15 08/20/2014 1016   BILITOT 0.56 08/20/2014 1346   BILITOT 0.7 08/20/2014 1016       Lab Results  Component Value Date   WBC 7.8 08/26/2015   HGB 13.3 08/26/2015   HCT 39.2 08/26/2015   MCV 90.2 08/26/2015   PLT 181 08/26/2015   NEUTROABS 4.3 08/26/2015     ASSESSMENT & PLAN:  Primary cancer of upper outer quadrant of left female breast ([place]) Left lumpectomy: 0.9 cm invasive lobular  Carcinoma ,s/p central lumpectomy with sentinel node biopsy,  ER/PR positive s/p re-excion on 09/16/11 with final pathology  showing atypical hyperplasia ER positive PR positive HER-2 negative Ki-67 9% T1 cN0 M0 stage Ia pathologic staging Could not tolerate tamoxifen, refused radiation  Breast Cancer Surveillance: 1. Breast exam 08/26/2015: Normal 2. Mammogram 11/26/2014 No abnormalities. Postsurgical changes. Breast Density Category C. I recommended that she get 3-D mammograms for surveillance. Discussed the differences between different breast density categories.   Return to clinic in 1 year with survivorship clinic and I will see her every alternate year.  No orders of the defined types were placed in this encounter.   The patient has a good understanding of the overall plan. she agrees with it. she will call with any problems that may develop before the next visit here.   Rulon Eisenmenger, MD 08/26/2015

## 2015-08-28 ENCOUNTER — Ambulatory Visit (INDEPENDENT_AMBULATORY_CARE_PROVIDER_SITE_OTHER): Payer: Medicare Other | Admitting: Internal Medicine

## 2015-08-28 ENCOUNTER — Encounter: Payer: Self-pay | Admitting: Internal Medicine

## 2015-08-28 VITALS — BP 172/80 | HR 85 | Temp 98.4°F | Resp 16 | Wt 148.0 lb

## 2015-08-28 DIAGNOSIS — E1043 Type 1 diabetes mellitus with diabetic autonomic (poly)neuropathy: Secondary | ICD-10-CM

## 2015-08-28 DIAGNOSIS — E7800 Pure hypercholesterolemia, unspecified: Secondary | ICD-10-CM | POA: Diagnosis not present

## 2015-08-28 DIAGNOSIS — E039 Hypothyroidism, unspecified: Secondary | ICD-10-CM | POA: Diagnosis not present

## 2015-08-28 DIAGNOSIS — I10 Essential (primary) hypertension: Secondary | ICD-10-CM

## 2015-08-28 DIAGNOSIS — R197 Diarrhea, unspecified: Secondary | ICD-10-CM | POA: Insufficient documentation

## 2015-08-28 MED ORDER — PSYLLIUM 100 % PO POWD
ORAL | Status: DC
Start: 2015-08-28 — End: 2015-09-07

## 2015-08-28 MED ORDER — PSYLLIUM 100 % PO POWD: ORAL | Status: DC

## 2015-08-28 MED ORDER — LOSARTAN POTASSIUM 50 MG PO TABS: 50.0000 mg | ORAL_TABLET | Freq: Every day | ORAL | Status: DC

## 2015-08-28 NOTE — Patient Instructions (Addendum)
Try konsyl fiber - a prescription was sent to your pharmacy. Adjust as needed.  Continue imodium as needed as instructed by your gastroenterologist.     Make an appointment with the nurse to have your blood pressure checked.  Bring your cuff from home in.

## 2015-08-28 NOTE — Assessment & Plan Note (Signed)
Following with endocrine

## 2015-08-28 NOTE — Assessment & Plan Note (Signed)
Following with endocrine who manages her medication

## 2015-08-28 NOTE — Assessment & Plan Note (Signed)
Blood pressure elevated here and has been elevated at other doctor's offices Blood pressure cuff at home shows well-controlled BP Return for nurse visit and she will bring her blood pressure cuff Possible whitecoat hypertension versus uncontrolled blood pressure

## 2015-08-28 NOTE — Assessment & Plan Note (Signed)
Taking simvastatin 20 mg daily Lipid panel controlled Continue same

## 2015-08-28 NOTE — Progress Notes (Signed)
Pre visit review using our clinic review tool, if applicable. No additional management support is needed unless otherwise documented below in the visit note. 

## 2015-08-28 NOTE — Progress Notes (Signed)
Subjective:    Patient ID: SIBLEY ROLISON, female    DOB: Feb 24, 1929, 82 y.o.   MRN: [id number]  HPI She is here to establish with a new pcp.    In 2014 she had emergent small intestine surgery for obstruction- 6 inches removed.  Ever since then she has had diarrhea.  No blood in stool.  Can have up to 4 bowel movements a day.  Increasing the fiber has helped - her stool is now slightly formed.  She is currently taking benefiber 2 tablespoons daily.  She has not tried any other fiber. She takes Imodium as needed. She is following with the gastroenterologist. She had a colonoscopy last month and had a biopsy done. She was found to have lymphocytic colitis by biopsy.  She was advised imodium prn and fiber daily.  She has been taking the fiber for one month. She is extremely sensitive to steroids-she has type 1 diabetes and his elevates her sugar tremendously even if she touches a steroid cream.  Anxiety:  She takes Xanax twice daily. She feels this does help with her anxiety.   Hypertension: Recently her blood pressure has been elevated at Drs. office.  She is taking her medication daily. She is compliant with a low sodium diet.  She denies chest pain, palpitations, edema, shortness of breath and regular headaches. She is exercising regularly.  She does monitor her blood pressure at home and it has been well controlled. She thinks her blood pressure cuff is accurate.     Type 1 dm: She sees endocrine for her type 1 diabetes. She has an insulin pump.  Hypothyroidism: She is taking her medication daily. She also follows with endocrine for her thyroid.   Medications and allergies reviewed with patient and updated if appropriate.  Patient Active Problem List   Diagnosis Date Noted  . Hyponatremia   . Acute mesenteric ischemia ([place])   . Hypothyroidism 07/11/2012  . Precordial pain 03/18/2012  . Syncope 03/18/2012  . Unstable angina ([place]) 03/17/2012  . Abnormal LFTs   . Hypercholesteremia     . Hypertension   . Diabetes mellitus type 1 ([place])   . Osteoarthritis   . Thyroid nodule   . Osteoporosis, post-menopausal   . Primary cancer of upper outer quadrant of left female breast ([place]) 08/21/2011    Current Outpatient Prescriptions on File Prior to Visit  Medication Sig Dispense Refill  . ALPRAZolam (XANAX) 0.5 MG tablet Take 0.5 tablets (0.25 mg total) by mouth 2 (two) times daily as needed for anxiety or sleep. (Patient taking differently: Take 0.25 mg by mouth daily as needed for anxiety or sleep. ) 30 tablet 5  . aspirin 81 MG tablet Take 81 mg by mouth daily.    . Calcium Citrate-Vitamin D (CITRACAL + D PO) Take 1,000 mg by mouth daily.    . cholecalciferol (VITAMIN D) 1000 UNITS tablet Take 1,000 Units by mouth 2 (two) times daily.     . fish oil-omega-3 fatty acids 1000 MG capsule Take 2 g by mouth daily.    [name] glucose blood (ONE TOUCH ULTRA TEST) test strip Used to test blood sugars 8 times a day    . insulin aspart (NOVOLOG FLEXPEN) 100 UNIT/ML FlexPen 1 unit for every 6 grams/CHO  Breakfast, 1 unit for every 10 grams/CHO lunch, 1 unit for every 8 grams/CHO supperCorrection glucose-120/60 at meals    . Insulin Glargine (LANTUS SOLOSTAR) 100 UNIT/ML Solostar Pen Inject 16 Units into the skin.    [name]  levothyroxine (SYNTHROID, LEVOTHROID) 112 MCG tablet Take 1 tablet (112 mcg total) by mouth daily. Must establish with NEW PCP for additional refills. 90 tablet 1  . Multiple Vitamin (MULTIVITAMIN) tablet Take 1 tablet by mouth daily. For breast and bone health    . simvastatin (ZOCOR) 20 MG tablet Take 20 mg by mouth at bedtime.     . vitamin C (ASCORBIC ACID) 500 MG tablet Take 1,000 mg by mouth daily.     No current facility-administered medications on file prior to visit.    Past Medical History  Diagnosis Date  . Hyponatremia   . Hypothyroidism   . Diabetes mellitus     type wears insulin pump  . GERD (gastroesophageal reflux disease)   . Arthritis     fingers  .  Hypothyroid   . Hypercholesteremia   . Hypertension   . Diabetes mellitus type 1 ([facility])     type I- wears insulin pump  . Osteoarthritis   . PAF (paroxysmal atrial fibrillation) ([place])     One episode in 2008, spontaneously converted in the ED, no further treatment  . Thyroid nodule dx 07/2011    [place] q 34moto follow calcified L thyroid nodule (12/08/11 [place]  . Osteoporosis, post-menopausal     DEXA 12/02/11: -3.5 (with lomax gyn), declines bisphos due to bone pain  . Spondylosis     thoracic spine  . Cancer of upper-outer quadrant of female breast ([place] 08/21/2011    ER +  PR +  Her 2 -  Ki67 9%   0.9 cm invasive lobular  Carcinoma ,s/p central lumpectomy with sentinel node biopsy,  ER/PR positive s/p re-excion on 09/16/11 with final pathology showing atypical hyperplasia     Past Surgical History  Procedure Laterality Date  . Cardiovascular stress test  03/17/2012  . Breast surgery      biopsy  . Breast lumpectomy    . Appendectomy    . Abdominal hysterectomy    . Bladder repair  5 yrs ago  . Tonsillectomy   age 82 . Appendectomy  age 82 . Abdominal hysterectomy  yrs ago  . Breast surgery   yrs ago    br bx  . Breast surgery  01/ 23/2013    left central lumpectomy and snbx, re-excision lumpectomy  . Left heart catheterization with coronary angiogram N/A 03/18/2012    Procedure: LEFT HEART CATHETERIZATION WITH CORONARY ANGIOGRAM;  Surgeon: JMinus Breeding MD;  [facility];  Service: Cardiovascular;  Laterality: N/A;    Social History   Social History  . Marital Status: Married    Spouse Name: N/A  . Number of Children: N/A  . Years of Education: N/A   Social History Main Topics  . Smoking status: Former Smoker -- 1.00 packs/day for 10 years    Types: Cigarettes    Quit date: 08/27/1977  . Smokeless tobacco: Never Used  . Alcohol Use: Yes     Comment: rare  . Drug Use: No  . Sexual Activity: Yes    Birth Control/ Protection: Post-menopausal   Other Topics  Concern  . None   Social History Narrative   ** Merged History Encounter **        Family History  Problem Relation Age of Onset  . Cancer Sister     unknown  . Hypertension Mother   . Stroke Mother   . Arthritis Father   . Heart disease Father   . Diabetes Son  Review of Systems  Constitutional: Negative for fever and chills.  Respiratory: Positive for cough (dry) and wheezing (rare). Negative for shortness of breath.   Cardiovascular: Positive for palpitations and leg swelling (ankle swelling). Negative for chest pain.  Gastrointestinal: Positive for abdominal pain (umbilical hernia) and diarrhea. Negative for blood in stool.  Neurological: Positive for light-headedness and headaches.       Objective:   Filed Vitals:   08/28/15 1528  BP: 172/80  Pulse: 85  Temp: 98.4 F (36.9 C)  Resp: 16   Filed Weights   08/28/15 1528  Weight: 148 lb (67.132 kg)   Body mass index is 23.17 kg/(m^2).   Physical Exam Constitutional: Appears well-developed and well-nourished. No distress.  Neck: Neck supple. No tracheal deviation present. No thyromegaly present.  No carotid bruit. No cervical adenopathy.   Cardiovascular: Normal rate, regular rhythm and normal heart sounds.   2/6 systolic murmur .  No edema Pulmonary/Chest: Effort normal and breath sounds normal. No respiratory distress. No wheezes.        Assessment & Plan:

## 2015-08-28 NOTE — Assessment & Plan Note (Signed)
Since bowel resection for obstruction in 2014 Recent colonoscopy shows lymphocytic colitis Taking Imodium Taking fiber Does not tolerate steroids-causes significant increase in sugars Following with GI Will try Konsyl fiber to see if that is more beneficial than Benefiber

## 2015-08-29 ENCOUNTER — Other Ambulatory Visit: Payer: Self-pay

## 2015-08-29 MED ORDER — ALPRAZOLAM 0.5 MG PO TABS: 0.2500 mg | ORAL_TABLET | Freq: Two times a day (BID) | ORAL | Status: DC | PRN

## 2015-08-29 NOTE — Telephone Encounter (Signed)
[employer] rq for rf of Alprazolam 0.5  pls advise

## 2015-08-29 NOTE — Telephone Encounter (Signed)
rx printed, on dr burns desk to be signed

## 2015-08-30 ENCOUNTER — Telehealth: Payer: Self-pay

## 2015-08-30 NOTE — Telephone Encounter (Signed)
Xanax rx faxed to [facility]

## 2015-09-03 ENCOUNTER — Ambulatory Visit: Payer: Medicare Other

## 2015-09-03 VITALS — BP 142/72

## 2015-09-03 DIAGNOSIS — I10 Essential (primary) hypertension: Secondary | ICD-10-CM

## 2015-09-06 ENCOUNTER — Observation Stay (HOSPITAL_COMMUNITY)
Admission: EM | Admit: 2015-09-06 | Discharge: 2015-09-07 | Disposition: A | Discharge status: Home / Self Care | Payer: Medicare Other | Attending: Internal Medicine | Admitting: Internal Medicine

## 2015-09-06 ENCOUNTER — Emergency Department (HOSPITAL_COMMUNITY): Payer: Medicare Other

## 2015-09-06 ENCOUNTER — Encounter (HOSPITAL_COMMUNITY): Payer: Self-pay

## 2015-09-06 ENCOUNTER — Telehealth: Payer: Self-pay | Admitting: Internal Medicine

## 2015-09-06 DIAGNOSIS — R0789 Other chest pain: Secondary | ICD-10-CM | POA: Diagnosis not present

## 2015-09-06 DIAGNOSIS — E7800 Pure hypercholesterolemia, unspecified: Secondary | ICD-10-CM | POA: Diagnosis not present

## 2015-09-06 DIAGNOSIS — Z87891 Personal history of nicotine dependence: Secondary | ICD-10-CM | POA: Insufficient documentation

## 2015-09-06 DIAGNOSIS — Z9641 Presence of insulin pump (external) (internal): Secondary | ICD-10-CM | POA: Diagnosis not present

## 2015-09-06 DIAGNOSIS — R0602 Shortness of breath: Secondary | ICD-10-CM | POA: Insufficient documentation

## 2015-09-06 DIAGNOSIS — K429 Umbilical hernia without obstruction or gangrene: Secondary | ICD-10-CM | POA: Insufficient documentation

## 2015-09-06 DIAGNOSIS — Z853 Personal history of malignant neoplasm of breast: Secondary | ICD-10-CM | POA: Diagnosis not present

## 2015-09-06 DIAGNOSIS — K219 Gastro-esophageal reflux disease without esophagitis: Secondary | ICD-10-CM | POA: Insufficient documentation

## 2015-09-06 DIAGNOSIS — Z79899 Other long term (current) drug therapy: Secondary | ICD-10-CM | POA: Insufficient documentation

## 2015-09-06 DIAGNOSIS — I480 Paroxysmal atrial fibrillation: Secondary | ICD-10-CM | POA: Insufficient documentation

## 2015-09-06 DIAGNOSIS — Z7982 Long term (current) use of aspirin: Secondary | ICD-10-CM | POA: Insufficient documentation

## 2015-09-06 DIAGNOSIS — I2510 Atherosclerotic heart disease of native coronary artery without angina pectoris: Secondary | ICD-10-CM | POA: Diagnosis not present

## 2015-09-06 DIAGNOSIS — M810 Age-related osteoporosis without current pathological fracture: Secondary | ICD-10-CM | POA: Insufficient documentation

## 2015-09-06 DIAGNOSIS — E1065 Type 1 diabetes mellitus with hyperglycemia: Secondary | ICD-10-CM | POA: Insufficient documentation

## 2015-09-06 DIAGNOSIS — R739 Hyperglycemia, unspecified: Secondary | ICD-10-CM

## 2015-09-06 DIAGNOSIS — R002 Palpitations: Secondary | ICD-10-CM | POA: Insufficient documentation

## 2015-09-06 DIAGNOSIS — I252 Old myocardial infarction: Secondary | ICD-10-CM | POA: Diagnosis not present

## 2015-09-06 DIAGNOSIS — I10 Essential (primary) hypertension: Secondary | ICD-10-CM | POA: Diagnosis not present

## 2015-09-06 DIAGNOSIS — R079 Chest pain, unspecified: Secondary | ICD-10-CM

## 2015-09-06 DIAGNOSIS — E109 Type 1 diabetes mellitus without complications: Secondary | ICD-10-CM | POA: Diagnosis present

## 2015-09-06 DIAGNOSIS — E039 Hypothyroidism, unspecified: Secondary | ICD-10-CM | POA: Insufficient documentation

## 2015-09-06 DIAGNOSIS — E785 Hyperlipidemia, unspecified: Secondary | ICD-10-CM | POA: Diagnosis not present

## 2015-09-06 DIAGNOSIS — M19049 Primary osteoarthritis, unspecified hand: Secondary | ICD-10-CM | POA: Diagnosis not present

## 2015-09-06 DIAGNOSIS — E1043 Type 1 diabetes mellitus with diabetic autonomic (poly)neuropathy: Secondary | ICD-10-CM | POA: Diagnosis not present

## 2015-09-06 DIAGNOSIS — Z794 Long term (current) use of insulin: Secondary | ICD-10-CM | POA: Diagnosis not present

## 2015-09-06 LAB — GLUCOSE, CAPILLARY: Glucose-Capillary: 99 mg/dL (ref 65–99)

## 2015-09-06 LAB — CBC
HCT: 38.5 % (ref 36.0–46.0)
Hemoglobin: 13.3 g/dL (ref 12.0–15.0)
MCH: 31 pg (ref 26.0–34.0)
MCHC: 34.5 g/dL (ref 30.0–36.0)
MCV: 89.7 fL (ref 78.0–100.0)
PLATELETS: 193 10*3/uL (ref 150–400)
RBC: 4.29 MIL/uL (ref 3.87–5.11)
RDW: 12.4 % (ref 11.5–15.5)
WBC: 7.4 10*3/uL (ref 4.0–10.5)

## 2015-09-06 LAB — URINALYSIS, ROUTINE W REFLEX MICROSCOPIC
BILIRUBIN URINE: NEGATIVE
Glucose, UA: >1000 mg/dL — AB
Hgb urine dipstick: NEGATIVE
Ketones, ur: NEGATIVE mg/dL
Leukocytes, UA: NEGATIVE
NITRITE: NEGATIVE
PROTEIN: NEGATIVE mg/dL
SPECIFIC GRAVITY, URINE: 1.012 (ref 1.005–1.030)
pH: 6.5 (ref 5.0–8.0)

## 2015-09-06 LAB — HEPATIC FUNCTION PANEL
ALBUMIN: 4.1 g/dL (ref 3.5–5.0)
ALT: 16 U/L (ref 14–54)
AST: 29 U/L (ref 15–41)
Alkaline Phosphatase: 84 U/L (ref 38–126)
BILIRUBIN DIRECT: 0.2 mg/dL (ref 0.1–0.5)
Indirect Bilirubin: 0.5 mg/dL (ref 0.3–0.9)
Total Bilirubin: 0.7 mg/dL (ref 0.3–1.2)
Total Protein: 7.5 g/dL (ref 6.5–8.1)

## 2015-09-06 LAB — URINE MICROSCOPIC-ADD ON

## 2015-09-06 LAB — BASIC METABOLIC PANEL
Anion gap: 9 (ref 5–15)
BUN: 13 mg/dL (ref 6–20)
CALCIUM: 9.6 mg/dL (ref 8.9–10.3)
CHLORIDE: 100 mmol/L — AB (ref 101–111)
CO2: 25 mmol/L (ref 22–32)
CREATININE: 0.71 mg/dL (ref 0.44–1.00)
GFR calc non Af Amer: >60 mL/min (ref >60)
Glucose, Bld: 400 mg/dL — ABNORMAL HIGH (ref 65–99)
Potassium: 4.7 mmol/L (ref 3.5–5.1)
SODIUM: 134 mmol/L — AB (ref 135–145)

## 2015-09-06 LAB — T4, FREE: FREE T4: 0.91 ng/dL (ref 0.61–1.12)

## 2015-09-06 LAB — CBG MONITORING, ED: GLUCOSE-CAPILLARY: 132 mg/dL — AB (ref 65–99)

## 2015-09-06 LAB — TSH: TSH: 2.479 u[IU]/mL (ref 0.350–4.500)

## 2015-09-06 LAB — I-STAT TROPONIN, ED: TROPONIN I, POC: 0 ng/mL (ref 0.00–0.08)

## 2015-09-06 LAB — LIPASE, BLOOD: LIPASE: 19 U/L (ref 11–51)

## 2015-09-06 MED ORDER — INSULIN ASPART 100 UNIT/ML ~~LOC~~ SOLN: 0.0000 [IU] | Freq: Every day | SUBCUTANEOUS | Status: DC

## 2015-09-06 MED ORDER — ACETAMINOPHEN 325 MG PO TABS: 650.0000 mg | ORAL_TABLET | Freq: Four times a day (QID) | ORAL | Status: DC | PRN

## 2015-09-06 MED ORDER — ASPIRIN 81 MG PO CHEW
324.0000 mg | CHEWABLE_TABLET | Freq: Once | ORAL | Status: AC
  Administered 2015-09-06: 324 mg via ORAL
  Filled 2015-09-06: qty 4

## 2015-09-06 MED ORDER — LOSARTAN POTASSIUM 50 MG PO TABS
50.0000 mg | ORAL_TABLET | Freq: Every day | ORAL | Status: DC
  Administered 2015-09-07: 50 mg via ORAL
  Filled 2015-09-06: qty 1

## 2015-09-06 MED ORDER — SIMVASTATIN 20 MG PO TABS
20.0000 mg | ORAL_TABLET | Freq: Every day | ORAL | Status: DC
  Administered 2015-09-06: 20 mg via ORAL
  Filled 2015-09-06: qty 1

## 2015-09-06 MED ORDER — ALPRAZOLAM 0.25 MG PO TABS
0.2500 mg | ORAL_TABLET | Freq: Two times a day (BID) | ORAL | Status: DC | PRN
  Administered 2015-09-06: 0.25 mg via ORAL
  Filled 2015-09-06: qty 1

## 2015-09-06 MED ORDER — SODIUM CHLORIDE 0.9 % IV BOLUS (SEPSIS)
500.0000 mL | Freq: Once | INTRAVENOUS | Status: AC
  Administered 2015-09-06: 500 mL via INTRAVENOUS

## 2015-09-06 MED ORDER — INSULIN GLARGINE 100 UNIT/ML ~~LOC~~ SOLN
12.0000 [IU] | Freq: Every day | SUBCUTANEOUS | Status: DC
  Administered 2015-09-06: 12 [IU] via SUBCUTANEOUS
  Filled 2015-09-06 (×2): qty 0.12

## 2015-09-06 MED ORDER — ACETAMINOPHEN 650 MG RE SUPP: 650.0000 mg | Freq: Four times a day (QID) | RECTAL | Status: DC | PRN

## 2015-09-06 MED ORDER — ENOXAPARIN SODIUM 30 MG/0.3ML ~~LOC~~ SOLN
30.0000 mg | SUBCUTANEOUS | Status: DC
  Administered 2015-09-06: 30 mg via SUBCUTANEOUS
  Filled 2015-09-06: qty 0.3

## 2015-09-06 MED ORDER — INSULIN ASPART 100 UNIT/ML ~~LOC~~ SOLN
10.0000 [IU] | Freq: Once | SUBCUTANEOUS | Status: AC
  Administered 2015-09-06: 10 [IU] via INTRAVENOUS

## 2015-09-06 MED ORDER — ASPIRIN 81 MG PO CHEW
324.0000 mg | CHEWABLE_TABLET | Freq: Every day | ORAL | Status: DC
  Administered 2015-09-07: 324 mg via ORAL
  Filled 2015-09-06: qty 4

## 2015-09-06 MED ORDER — ONDANSETRON HCL 4 MG/2ML IJ SOLN: 4.0000 mg | Freq: Four times a day (QID) | INTRAMUSCULAR | Status: DC | PRN

## 2015-09-06 MED ORDER — NITROGLYCERIN 0.4 MG SL SUBL
0.4000 mg | SUBLINGUAL_TABLET | SUBLINGUAL | Status: AC | PRN
  Administered 2015-09-06 (×4): 0.4 mg via SUBLINGUAL
  Filled 2015-09-06 (×3): qty 1

## 2015-09-06 MED ORDER — ONDANSETRON HCL 4 MG PO TABS: 4.0000 mg | ORAL_TABLET | Freq: Four times a day (QID) | ORAL | Status: DC | PRN

## 2015-09-06 MED ORDER — LEVOTHYROXINE SODIUM 112 MCG PO TABS
112.0000 ug | ORAL_TABLET | Freq: Every day | ORAL | Status: DC
  Administered 2015-09-07: 112 ug via ORAL
  Filled 2015-09-06: qty 1

## 2015-09-06 MED ORDER — INSULIN ASPART 100 UNIT/ML ~~LOC~~ SOLN
0.0000 [IU] | Freq: Three times a day (TID) | SUBCUTANEOUS | Status: DC
  Administered 2015-09-06: 2 [IU] via SUBCUTANEOUS
  Administered 2015-09-07: 8 [IU] via SUBCUTANEOUS

## 2015-09-06 MED ORDER — INSULIN GLARGINE 100 UNIT/ML SOLOSTAR PEN: 12.0000 [IU] | PEN_INJECTOR | Freq: Every day | SUBCUTANEOUS | Status: DC

## 2015-09-06 MED ORDER — VITAMIN D 1000 UNITS PO TABS
1000.0000 [IU] | ORAL_TABLET | Freq: Two times a day (BID) | ORAL | Status: DC
  Administered 2015-09-06 – 2015-09-07 (×2): 1000 [IU] via ORAL
  Filled 2015-09-06 (×2): qty 1

## 2015-09-06 NOTE — ED Notes (Signed)
MD at bedside. 

## 2015-09-06 NOTE — ED Provider Notes (Signed)
CSN: [id number]     Arrival date & time 09/06/15  1133 History   First MD Initiated Contact with Patient 09/06/15 1204     Chief Complaint  Patient presents with  . Chest Pain  . Shortness of Breath     (Consider location/radiation/quality/duration/timing/severity/associated sxs/prior Treatment) HPI Patient presents with palpitations that woke her from sleep at 5:30 AM. She states Patient's were irregular and rapid. Lasted roughly 2-1/2 hours before resolving spontaneously. States she's had similar symptoms previously. Noted to have a history of proximal atrial fibrillation with spontaneous conversion. She is not on any anticoagulants. She states she had associated shortness of breath that has also resolved. She has had central chest pressure that radiates through to her back at onset of palpitations. She states that her chest pressure is still present. No new lower extremity swelling or pain. No lightheadedness or dizziness. No recent fevers, chills, cough, nausea or vomiting. Patient has ongoing episodic abdominal discomfort since partial small bowel resection last year. Past Medical History  Diagnosis Date  . Hyponatremia   . Hypothyroidism   . Diabetes mellitus     type wears insulin pump  . GERD (gastroesophageal reflux disease)   . Arthritis     fingers  . Hypothyroid   . Hypercholesteremia   . Hypertension   . Diabetes mellitus type 1 ([facility])     type I- wears insulin pump  . Osteoarthritis   . PAF (paroxysmal atrial fibrillation) ([place])     One episode in 2008, spontaneously converted in the ED, no further treatment  . Thyroid nodule dx 07/2011    [place] q 85moto follow calcified L thyroid nodule (12/08/11 [place]  . Osteoporosis, post-menopausal     DEXA 12/02/11: -3.5 (with lomax gyn), declines bisphos due to bone pain  . Spondylosis     thoracic spine  . [place]/21[place]    ER +  PR +  Her 2 -  Ki67 9%   0.9 cm invasive lobular   Carcinoma ,s/p central lumpectomy with sentinel node biopsy,  ER/PR positive s/p re-excion on 09/16/11 with final pathology showing atypical hyperplasia    Past Surgical History  Procedure Laterality Date  . Cardiovascular stress test  03/17/2012  . Breast surgery      biopsy  . Breast lumpectomy    . Appendectomy    . Abdominal hysterectomy    . Bladder repair  5 yrs ago  . Tonsillectomy   age 82 . Appendectomy  age 82 . Abdominal hysterectomy  yrs ago  . Breast surgery   yrs ago    br bx  . Breast surgery  01/ 23/2013    left central lumpectomy and snbx, re-excision lumpectomy  . Left heart catheterization with coronary angiogram N/A 03/18/2012    Procedure: LEFT HEART CATHETERIZATION WITH CORONARY ANGIOGRAM;  Surgeon: JMinus Breeding MD;  [facility];  Service: Cardiovascular;  Laterality: N/A;   Family History  Problem Relation Age of Onset  . Cancer Sister     unknown  . Hypertension Mother   . Stroke Mother   . Arthritis Father   . Heart disease Father   . Diabetes Son    Social History  Substance Use Topics  . Smoking status: Former Smoker -- 1.00 packs/day for 10 years    Types: Cigarettes    Quit date: 08/27/1977  . Smokeless tobacco: Never Used  . Alcohol Use: Yes  Comment: rare   OB History    No data available     Review of Systems  Constitutional: Negative for fever and chills.  HENT: Negative for congestion, rhinorrhea and sore throat.   Respiratory: Positive for chest tightness and shortness of breath. Negative for cough.   Cardiovascular: Positive for palpitations. Negative for chest pain and leg swelling.  Gastrointestinal: Positive for abdominal pain. Negative for nausea, vomiting and diarrhea.  Genitourinary: Negative for dysuria, frequency, flank pain and difficulty urinating.  Musculoskeletal: Positive for back pain. Negative for myalgias and arthralgias.  Skin: Negative for rash and wound.  Neurological: Negative for dizziness,  weakness, light-headedness, numbness and headaches.  All other systems reviewed and are negative.     Allergies  Augmentin; Keflex; Prednisone; Hydrocodone; and Latex  Home Medications   Prior to Admission medications   Medication Sig Start Date End Date Taking? Authorizing Provider  ALPRAZolam Duanne Moron) 0.5 MG tablet Take 0.5 tablets (0.25 mg total) by mouth 2 (two) times daily as needed for anxiety or sleep. 08/29/15  Yes Binnie Rail, MD  aspirin 81 MG tablet Take 81 mg by mouth daily.   Yes Historical Provider, MD  Calcium Citrate-Vitamin D (CITRACAL + D PO) Take 1,000 mg by mouth daily.   Yes Historical Provider, MD  cholecalciferol (VITAMIN D) 1000 UNITS tablet Take 1,000 Units by mouth 2 (two) times daily.    Yes Historical Provider, MD  fish oil-omega-3 fatty acids 1000 MG capsule Take 2 g by mouth daily.   Yes Historical Provider, MD  hydrochlorothiazide (MICROZIDE) 12.5 MG capsule Take 12.5 mg by mouth daily as needed (swelling).   Yes Historical Provider, MD  insulin aspart (NOVOLOG FLEXPEN) 100 UNIT/ML FlexPen 1 unit for every 6 grams/CHO  Breakfast, 1 unit for every 10 grams/CHO lunch, 1 unit for every 8 grams/CHO supperCorrection glucose-120/60 at meals 02/13/15  Yes Historical Provider, MD  Insulin Glargine (LANTUS SOLOSTAR) 100 UNIT/ML Solostar Pen Inject 16 Units into the skin daily at 10 pm.  07/29/14  Yes Historical Provider, MD  levothyroxine (SYNTHROID, LEVOTHROID) 112 MCG tablet Take 1 tablet (112 mcg total) by mouth daily. Must establish with NEW PCP for additional refills. 07/29/15  Yes Rowe Clack, MD  Loperamide HCl (IMODIUM PO) Take 1 tablet by mouth daily as needed (diarrhea).    Yes Historical Provider, MD  losartan (COZAAR) 50 MG tablet Take 1 tablet (50 mg total) by mouth daily. 08/28/15  Yes Binnie Rail, MD  Multiple Vitamin (MULTIVITAMIN) tablet Take 1 tablet by mouth daily. For breast and bone health   Yes Historical Provider, MD  simvastatin (ZOCOR) 20  MG tablet Take 20 mg by mouth at bedtime.    Yes Historical Provider, MD  vitamin C (ASCORBIC ACID) 500 MG tablet Take 1,000 mg by mouth daily.   Yes Historical Provider, MD  Wheat Dextrin (BENEFIBER PO) Take by mouth. 2 tablespoons before breakfast.   Yes Historical Provider, MD  glucose blood (ONE TOUCH ULTRA TEST) test strip Used to test blood sugars 8 times a day 08/01/12   Historical Provider, MD  Psyllium (KONSYL) 100 % POWD Take 1-3 teaspoons daily Patient not taking: Reported on 09/06/2015 08/28/15   Binnie Rail, MD   BP 199/67 mmHg  Pulse 62  Temp(Src) 98.7 F (37.1 C) (Oral)  Resp 18  SpO2 98% Physical Exam  Constitutional: She is oriented to person, place, and time. She appears well-developed and well-nourished. No distress.  HENT:  Head: Normocephalic and atraumatic.  Mouth/Throat: Oropharynx is clear and moist. No oropharyngeal exudate.  Eyes: EOM are normal. Pupils are equal, round, and reactive to light.  Neck: Normal range of motion. Neck supple. No thyromegaly present.  Cardiovascular: Normal rate and regular rhythm.  Exam reveals no gallop and no friction rub.   No murmur heard. Pulmonary/Chest: Effort normal and breath sounds normal. No respiratory distress. She has no wheezes. She has no rales. She exhibits no tenderness.  Abdominal: Soft. Bowel sounds are normal. She exhibits no distension and no mass. There is no tenderness. There is no rebound and no guarding.  Musculoskeletal: Normal range of motion. She exhibits no edema or tenderness.  No midline thoracic or lumbar tenderness. Patient does have mild there's palpation of the medial border of the left scapula. No lower extremity swelling or pain. Distal pulses equal and intact.  Neurological: She is alert and oriented to person, place, and time.  Moves all extremities without deficit. Sensation is fully intact.  Skin: Skin is warm and dry. No rash noted. No erythema.  Psychiatric: She has a normal mood and affect.  Her behavior is normal.  Nursing note and vitals reviewed.   ED Course  Procedures (including critical care time) Labs Review Labs Reviewed  BASIC METABOLIC PANEL - Abnormal; Notable for the following:    Sodium 134 (*)    Chloride 100 (*)    Glucose, Bld 400 (*)    All other components within normal limits  URINALYSIS, ROUTINE W REFLEX MICROSCOPIC (NOT AT [facility]) - Abnormal; Notable for the following:    Glucose, UA >1000 (*)    All other components within normal limits  URINE MICROSCOPIC-ADD ON - Abnormal; Notable for the following:    Squamous Epithelial / LPF 0-5 (*)    Bacteria, UA RARE (*)    All other components within normal limits  CBC  HEPATIC FUNCTION PANEL  LIPASE, BLOOD  TSH  T4, FREE  I-STAT TROPOININ, ED    Imaging Review Dg Chest 2 View  09/06/2015  CLINICAL DATA:  Tachycardia and mid chest pain EXAM: CHEST  2 VIEW COMPARISON:  02/15/2014 FINDINGS: The heart size and mediastinal contours are within normal limits. Both lungs are clear. The visualized skeletal structures are unremarkable. Postsurgical changes are noted in the left breast. IMPRESSION: No active cardiopulmonary disease. Electronically Signed   By: Inez Catalina M.D.   On: 09/06/2015 12:13   I have personally reviewed and evaluated these images and lab results as part of my medical decision-making.   EKG Interpretation   Date/Time:  Friday September 06 2015 11:43:36 EST Ventricular Rate:  81 PR Interval:  177 QRS Duration: 87 QT Interval:  412 QTC Calculation: 478 R Axis:   59 Text Interpretation:  Sinus rhythm Anteroseptal infarct, age indeterminate  Confirmed by Aleaya Latona  MD, Erika Slaby ([id number]) on 09/06/2015 12:39:02 PM      MDM   Final diagnoses:  Palpitations  Chest pain, unspecified chest pain type  Hyperglycemia   Patient's chest pressure has significantly improved after nitroglycerin. Continue to be in normal sinus rhythm. Initial troponin is normal. Discussed with Dr. Broadus John. We will  transfer to [facility] telemetry bed for likely stress test tomorrow.     Julianne Rice, MD 09/06/15 1440

## 2015-09-06 NOTE — ED Notes (Signed)
Called [facility] to check status of pickup request; pt is next on the list and should be transported within an hour. Family and primary nurse notified.

## 2015-09-06 NOTE — ED Notes (Signed)
Report called to [facility] 2W

## 2015-09-06 NOTE — H&P (Signed)
[facility] History and Physical  SHANTE ARCHAMBEAULT STM:[id number] DOB: [birth year] DOA: 09/06/2015  Referring physician: EDP PCP: Binnie Rail, MD   Chief Complaint: palpitations/chest pressure  HPI: [name] is a 82 y.o. female with PMH of Stage 28 Breast CA, DM on Insulin, Minimal non obstructive CAD on cath in 2013, h/o emergent small bowel resection in 2014 for obstruction, now with Umbilical hernia presents to the ER with the above complaints. She reports waking up this am at 5:30 with palpitations, this lasted for about 2.5hours, associated with shortness of breath, then resolved, this was followed by chest pressure/heaviness. No fevers, chills, cough, congestion, heartburn etc She called her PCP's office and was advised to come to ER, in ER BP was 199/67 on arrival, chest pressure resolved after SL nitro x2, no further palpitations since this am and [facility] was consulted for admission  Review of Systems: positives bolded Constitutional:  No weight loss, night sweats, Fevers, chills, fatigue.  HEENT:  No headaches, Difficulty swallowing,Tooth/dental problems,Sore throat,  No sneezing, itching, ear ache, nasal congestion, post nasal drip,  Cardio-vascular:   chest pain, Orthopnea, PND, swelling in lower extremities, anasarca, dizziness, palpitations  GI:  No heartburn, indigestion, abdominal pain, nausea, vomiting, diarrhea, change in bowel habits, loss of appetite  Resp:  No shortness of breath with exertion or at rest. No excess mucus, no productive cough, No non-productive cough, No coughing up of blood.No change in color of mucus.No wheezing.No chest wall deformity  Skin:  no rash or lesions.  GU:  no dysuria, change in color of urine, no urgency or frequency. No flank pain.  Musculoskeletal:  No joint pain or swelling. No decreased range of motion. No back pain.  Psych:  No change in mood or affect. No depression or anxiety. No memory loss.   Past Medical History    Diagnosis Date  . Hyponatremia   . Hypothyroidism   . Diabetes mellitus     type wears insulin pump  . GERD (gastroesophageal reflux disease)   . Arthritis     fingers  . Hypothyroid   . Hypercholesteremia   . Hypertension   . Diabetes mellitus type 1 (HCC)     type I- wears insulin pump  . Osteoarthritis   . PAF (paroxysmal atrial fibrillation) ([place])     One episode in 2008, spontaneously converted in the ED, no further treatment  . Thyroid nodule dx 07/2011    [place] q 34moto follow calcified L thyroid nodule (12/08/11 [place]  . Osteoporosis, post-menopausal     DEXA 12/02/11: -3.5 (with lomax gyn), declines bisphos due to bone pain  . Spondylosis     thoracic spine  . Cancer of upper-outer quadrant of female breast ([facility] 08/21/2011    ER +  PR +  Her 2 -  Ki67 9%   0.9 cm invasive lobular  Carcinoma ,s/p central lumpectomy with sentinel node biopsy,  ER/PR positive s/p re-excion on 09/16/11 with final pathology showing atypical hyperplasia    Past Surgical History  Procedure Laterality Date  . Cardiovascular stress test  03/17/2012  . Breast surgery      biopsy  . Breast lumpectomy    . Appendectomy    . Abdominal hysterectomy    . Bladder repair  5 yrs ago  . Tonsillectomy   age 82 . Appendectomy  age 82 . Abdominal hysterectomy  yrs ago  . Breast surgery   yrs ago    br bx  .  Breast surgery  01/ 23/2013    left central lumpectomy and snbx, re-excision lumpectomy  . Left heart catheterization with coronary angiogram N/A 03/18/2012    Procedure: LEFT HEART CATHETERIZATION WITH CORONARY ANGIOGRAM;  Surgeon: Minus Breeding, MD;  Location: [facility] C[facility] LAB;  Service: Cardiovascular;  Laterality: N/A;   Social History:  reports that she quit smoking about 38 years ago. Her smoking use included Cigarettes. She has a 10 pack-year smoking history. She has never used smokeless tobacco. She reports that she drinks alcohol. She reports that she does not use illicit drugs.  Allergies  Allergen  Reactions  . Augmentin [Amoxicillin-Pot Clavulanate] Other (See Comments)    Gi upset only no rash or hives   . Keflex [Cephalexin] Hives and Nausea Only    Stomach upset  . Prednisone     Other reaction(s): Other Makes blood sugars run high  . Hydrocodone Rash    Rash to chest , side back  . Latex Rash    Powder in gloves causes rash; "not allergic to latex just the powder"    Family History  Problem Relation Age of Onset  . Cancer Sister     unknown  . Hypertension Mother   . Stroke Mother   . Arthritis Father   . Heart disease Father   . Diabetes Son     Prior to Admission medications   Medication Sig Start Date End Date Taking? Authorizing Provider  ALPRAZolam Duanne Moron) 0.5 MG tablet Take 0.5 tablets (0.25 mg total) by mouth 2 (two) times daily as needed for anxiety or sleep. 08/29/15  Yes Binnie Rail, MD  aspirin 81 MG tablet Take 81 mg by mouth daily.   Yes Historical Provider, MD  Calcium Citrate-Vitamin D (CITRACAL + D PO) Take 1,000 mg by mouth daily.   Yes Historical Provider, MD  cholecalciferol (VITAMIN D) 1000 UNITS tablet Take 1,000 Units by mouth 2 (two) times daily.    Yes Historical Provider, MD  fish oil-omega-3 fatty acids 1000 MG capsule Take 2 g by mouth daily.   Yes Historical Provider, MD  hydrochlorothiazide (MICROZIDE) 12.5 MG capsule Take 12.5 mg by mouth daily as needed (swelling).   Yes Historical Provider, MD  insulin aspart (NOVOLOG FLEXPEN) 100 UNIT/ML FlexPen 1 unit for every 6 grams/CHO  Breakfast, 1 unit for every 10 grams/CHO lunch, 1 unit for every 8 grams/CHO supperCorrection glucose-120/60 at meals 02/13/15  Yes Historical Provider, MD  Insulin Glargine (LANTUS SOLOSTAR) 100 UNIT/ML Solostar Pen Inject 16 Units into the skin daily at 10 pm.  07/29/14  Yes Historical Provider, MD  levothyroxine (SYNTHROID, LEVOTHROID) 112 MCG tablet Take 1 tablet (112 mcg total) by mouth daily. Must establish with NEW PCP for additional refills. 07/29/15  Yes  Rowe Clack, MD  Loperamide HCl (IMODIUM PO) Take 1 tablet by mouth daily as needed (diarrhea).    Yes Historical Provider, MD  losartan (COZAAR) 50 MG tablet Take 1 tablet (50 mg total) by mouth daily. 08/28/15  Yes Binnie Rail, MD  Multiple Vitamin (MULTIVITAMIN) tablet Take 1 tablet by mouth daily. For breast and bone health   Yes Historical Provider, MD  simvastatin (ZOCOR) 20 MG tablet Take 20 mg by mouth at bedtime.    Yes Historical Provider, MD  vitamin C (ASCORBIC ACID) 500 MG tablet Take 1,000 mg by mouth daily.   Yes Historical Provider, MD  Wheat Dextrin (BENEFIBER PO) Take by mouth. 2 tablespoons before breakfast.   Yes Historical Provider, MD  glucose blood (ONE TOUCH ULTRA TEST) test strip Used to test blood sugars 8 times a day 08/01/12   Historical Provider, MD  Psyllium (KONSYL) 100 % POWD Take 1-3 teaspoons daily Patient not taking: Reported on 09/06/2015 08/28/15   Binnie Rail, MD   Physical Exam: Filed Vitals:   09/06/15 1147 09/06/15 1456  BP: 199/67 136/62  Pulse: 62 70  Temp: 98.7 F (37.1 C)   TempSrc: Oral   Resp: 18 20  SpO2: 98% 98%    Wt Readings from Last 3 Encounters:  08/28/15 67.132 kg (148 lb)  08/26/15 67.541 kg (148 lb 14.4 oz)  04/16/15 65.772 kg (145 lb)    General:  Appears calm and comfortable, AAOx3, no distress Eyes: PERRL, normal lids, irises & conjunctiva ENT: grossly normal lips & tongue Neck: no LAD, masses or thyromegaly Cardiovascular: RRR, no m/r/g. No LE edema. Telemetry: SR, no arrhythmias  Respiratory: CTA bilaterally, no w/r/r. Normal respiratory effort. Abdomen: soft, ND, mild LLQ tenderness-chronic per pt, BS present Skin: no rash or induration seen on limited exam Musculoskeletal: grossly normal tone BUE/BLE Psychiatric: grossly normal mood and affect, speech fluent and appropriate Neurologic: grossly non-focal.          Labs on Admission:  Basic Metabolic Panel:  Recent Labs Lab 09/06/15 1209  NA 134*    K 4.7  CL 100*  CO2 25  GLUCOSE 400*  BUN 13  CREATININE 0.71  CALCIUM 9.6   Liver Function Tests:  Recent Labs Lab 09/06/15 1246  AST 29  ALT 16  ALKPHOS 84  BILITOT 0.7  PROT 7.5  ALBUMIN 4.1    Recent Labs Lab 09/06/15 1246  LIPASE 19   No results for input(s): AMMONIA in the last 168 hours. CBC:  Recent Labs Lab 09/06/15 1209  WBC 7.4  HGB 13.3  HCT 38.5  MCV 89.7  PLT 193   Cardiac Enzymes: No results for input(s): CKTOTAL, CKMB, CKMBINDEX, TROPONINI in the last 168 hours.  BNP (last 3 results) No results for input(s): BNP in the last 8760 hours.  ProBNP (last 3 results) No results for input(s): PROBNP in the last 8760 hours.  CBG: No results for input(s): GLUCAP in the last 168 hours.  Radiological Exams on Admission: Dg Chest 2 View  09/06/2015  CLINICAL DATA:  Tachycardia and mid chest pain EXAM: CHEST  2 VIEW COMPARISON:  02/15/2014 FINDINGS: The heart size and mediastinal contours are within normal limits. Both lungs are clear. The visualized skeletal structures are unremarkable. Postsurgical changes are noted in the left breast. IMPRESSION: No active cardiopulmonary disease. Electronically Signed   By: Inez Catalina M.D.   On: 09/06/2015 12:13    EKG: Independently reviewed. NSR, Q waves in V1,V2  Assessment/Plan    Chest pain/pressure and Palpitations -EKG without acute changes and Troponin negative x1 -LHC in 2013 with mild plaque only -admit to Tele at [facility], observation -in NSR, chest pain resolved at this time -monitor on tele, cycle cardiac enzymes -Consult Cards to FU in am at [facility]   DM -resume lantus, cut down dose to 12units, SSI -FU hbaic   Hypertension -BP was 199/67 on arrival, now improved -continue cozaar HCTZ on hold    Stage 1 Breast CA -under surveillance per Dr.Gudena    Umbilical hernia -follows with Dr.Wakefield  Code Status: Full Code DVT Prophylaxis: Lovenox Family Communication: spouse at  bedside Disposition Plan: Transfer to [facility] for cardiac workup  Time spent:72mn  [facility] Pager [phone]

## 2015-09-06 NOTE — Progress Notes (Signed)
Pt admitted to unit. No complaints of pain. VSS. Pt oriented to unit. Call bell within place. WEill continue to monitor.  Raliegh Ip RN

## 2015-09-06 NOTE — ED Notes (Signed)
Per pt, chest pain with shortness of breath starting at 5:30 am.  Felt heart was racing.  Felt fine before bed.  Some pain down to left rib cage.  Has had cough for 2 weeks.

## 2015-09-06 NOTE — Telephone Encounter (Signed)
[facility] Patient Name: [name] DOB: 1929-03-02 Initial Comment Caller states woke up with heart beating so fast could not count rate; chest pain; bad cold; lungs feel full, cannot get a full breathe; Nurse Assessment Nurse: Mechele Dawley, RN, Amy Date/Time (Eastern Time): 09/06/2015 9:46:01 AM Confirm and document reason for call. If symptomatic, describe symptoms. You must click the next button to save text entered. ---SHE WOKE UP WITH HER HEART RATE WAS SO FAST THAT SHE COULD NOT COUNT IT. SHE IS HAVING CHEST PAIN, PRESSURE IN THE MIDDLE. SHE IS HAVING A BAD COLD AS WELL. SHE IS COUGHING AS WELL AND FEELS LIKE SHE HAS PNEUMONIA. SHE IS NOT ABLE TO GET A FULL DEEP BREATHE. SHE FEELS LIKE HER HEAD IS ACHING AS WELL. SHE FEELS LIKE A HEAD COLD. Has the patient traveled out of the country within the last 30 days? ---Not Applicable Does the patient have any new or worsening symptoms? ---Yes Will a triage be completed? ---Yes Related visit to physician within the last 2 weeks? ---No Does the PT have any chronic conditions? (i.e. diabetes, asthma, etc.) ---Yes List chronic conditions. ---HTN, DIABETES, STOMACH ISSUES, HERNIA UMBILICAL, Is this a behavioral health or substance abuse call? ---No Guidelines Guideline Title Affirmed Question Affirmed Notes Chest Pain Difficulty breathing Final Disposition User Go to ED Now Mechele Dawley, RN, Amy Referrals [facility] - ED Disagree/Comply: Comply

## 2015-09-06 NOTE — Progress Notes (Signed)
Pt had an episode of 4/10 chest fullness and pressure. Relieved with SL nitro x2. EKG taken. VSS. MD made aware. New orders given. Will continue to monitor closely.  Raliegh Ip RN

## 2015-09-07 ENCOUNTER — Other Ambulatory Visit: Payer: Self-pay | Admitting: Cardiology

## 2015-09-07 DIAGNOSIS — R072 Precordial pain: Secondary | ICD-10-CM | POA: Diagnosis not present

## 2015-09-07 DIAGNOSIS — I10 Essential (primary) hypertension: Secondary | ICD-10-CM | POA: Diagnosis not present

## 2015-09-07 DIAGNOSIS — E1043 Type 1 diabetes mellitus with diabetic autonomic (poly)neuropathy: Secondary | ICD-10-CM | POA: Diagnosis not present

## 2015-09-07 DIAGNOSIS — R002 Palpitations: Secondary | ICD-10-CM | POA: Diagnosis not present

## 2015-09-07 DIAGNOSIS — I480 Paroxysmal atrial fibrillation: Secondary | ICD-10-CM

## 2015-09-07 DIAGNOSIS — R079 Chest pain, unspecified: Secondary | ICD-10-CM

## 2015-09-07 LAB — COMPREHENSIVE METABOLIC PANEL
ALK PHOS: 66 U/L (ref 38–126)
ALT: 11 U/L — AB (ref 14–54)
AST: 24 U/L (ref 15–41)
Albumin: 3.1 g/dL — ABNORMAL LOW (ref 3.5–5.0)
Anion gap: 9 (ref 5–15)
BUN: 8 mg/dL (ref 6–20)
CALCIUM: 9.2 mg/dL (ref 8.9–10.3)
CO2: 24 mmol/L (ref 22–32)
CREATININE: 0.61 mg/dL (ref 0.44–1.00)
Chloride: 106 mmol/L (ref 101–111)
Glucose, Bld: 135 mg/dL — ABNORMAL HIGH (ref 65–99)
Potassium: 3.6 mmol/L (ref 3.5–5.1)
Sodium: 139 mmol/L (ref 135–145)
Total Bilirubin: 0.2 mg/dL — ABNORMAL LOW (ref 0.3–1.2)
Total Protein: 6.5 g/dL (ref 6.5–8.1)

## 2015-09-07 LAB — TROPONIN I

## 2015-09-07 LAB — CBC
HCT: 33 % — ABNORMAL LOW (ref 36.0–46.0)
Hemoglobin: 11.2 g/dL — ABNORMAL LOW (ref 12.0–15.0)
MCH: 30.4 pg (ref 26.0–34.0)
MCHC: 33.9 g/dL (ref 30.0–36.0)
MCV: 89.7 fL (ref 78.0–100.0)
Platelets: 175 10*3/uL (ref 150–400)
RBC: 3.68 MIL/uL — ABNORMAL LOW (ref 3.87–5.11)
RDW: 12.3 % (ref 11.5–15.5)
WBC: 6.4 10*3/uL (ref 4.0–10.5)

## 2015-09-07 LAB — GLUCOSE, CAPILLARY
Glucose-Capillary: 210 mg/dL — ABNORMAL HIGH (ref 65–99)
Glucose-Capillary: 269 mg/dL — ABNORMAL HIGH (ref 65–99)

## 2015-09-07 MED ORDER — DILTIAZEM HCL 120 MG PO TABS: 120.0000 mg | ORAL_TABLET | Freq: Once | ORAL | Status: DC | PRN

## 2015-09-07 MED ORDER — REGADENOSON 0.4 MG/5ML IV SOLN
0.4000 mg | Freq: Once | INTRAVENOUS | Status: DC
  Filled 2015-09-07: qty 5

## 2015-09-07 NOTE — Consult Note (Addendum)
Patient ID: [name] MRN: [id number], DOB/AGE: [birth year]   Admit date: 09/06/2015  Primary Physician: Binnie Rail, MD Primary Cardiologist: Dr. Warren Lacy Dr. Lovena Le  Pt. Profile:  82 y/o female with h/o IDDM, HTN, HLD, unexplained syncope in 2013 with cath showing nonobstructive CAD, presenting with CP and tachy palpitations.   Problem List  Past Medical History  Diagnosis Date  . Hyponatremia   . Hypothyroidism   . Diabetes mellitus     type wears insulin pump  . GERD (gastroesophageal reflux disease)   . Arthritis     fingers  . Hypothyroid   . Hypercholesteremia   . Hypertension   . Diabetes mellitus type 1 (HCC)     type I- wears insulin pump  . Osteoarthritis   . PAF (paroxysmal atrial fibrillation) ([place])     One episode in 2008, spontaneously converted in the ED, no further treatment  . Thyroid nodule dx 07/2011    [place] q 47moto follow calcified L thyroid nodule (12/08/11 [place]  . Osteoporosis, post-menopausal     DEXA 12/02/11: -3.5 (with lomax gyn), declines bisphos due to bone pain  . Spondylosis     thoracic spine  . [place] [place]    ER +  PR +  Her 2 -  Ki67 9%   0.9 cm invasive lobular  Carcinoma ,s/p central lumpectomy with sentinel node biopsy,  ER/PR positive s/p re-excion on 09/16/11 with final pathology showing atypical hyperplasia     Past Surgical History  Procedure Laterality Date  . Cardiovascular stress test  03/17/2012  . Breast surgery      biopsy  . Breast lumpectomy    . Appendectomy    . Abdominal hysterectomy    . Bladder repair  5 yrs ago  . Tonsillectomy   age 82 . Appendectomy  age 89 . Abdominal hysterectomy  yrs ago  . Breast surgery   yrs ago    br bx  . Breast surgery  01/ 23/2013    left central lumpectomy and snbx, re-excision lumpectomy  . Left heart catheterization with coronary angiogram N/A 03/18/2012    Procedure: LEFT HEART CATHETERIZATION WITH CORONARY ANGIOGRAM;   Surgeon: JMinus Breeding MD;  [facility];  Service: Cardiovascular;  Laterality: N/A;     Allergies  Allergies  Allergen Reactions  . Augmentin [Amoxicillin-Pot Clavulanate] Other (See Comments)    Gi upset only no rash or hives   . Keflex [Cephalexin] Hives and Nausea Only    Stomach upset  . Prednisone     Other reaction(s): Other Makes blood sugars run high  . Hydrocodone Rash    Rash to chest , side back  . Latex Rash    Powder in gloves causes rash; "not allergic to latex just the powder"    HPI The patient is a 82y/o female, seen in the past by Dr. HPercival Spanishand Dr. TLovena Le with a history of IDDM, HTN, HLD, and unexplained syncope in the setting of a non-ST elevation MI in 2013. This had occurred all after undergoing radiation therapy for breast cancer. She underwent catheterization 03/18/2012 which demonstrated no obstructive coronary disease/ mild coronary plaque. NL LV function. EF was 65%. There were mild luminal irregularities of the LAD and LCx and diffuse scattered 25% RCA lesions. She has had no recurrent syncope/ near sycnope. She as doing well until yesterday morning, when she awoke with substernal chest pain, awaking her  from her sleep. Felt like chest pressure and radiated to her back. She had associated dyspnea and tachy palpitations. Symptoms occurred off and on for 2.5 hrs. She came to the ED for evaluation. On arrival she was CP free but later developed recurrent substernal chest pressure, relieved with SL NTG. She denies any additional CP since that time. No further palpitations or dyspnea. She was admitted by IM. Cardiac enzymes are negative x 3. EKG shows NSR. No ischemia. Cardiology has been asked to evaluate.    Home Medications  Prior to Admission medications   Medication Sig Start Date End Date Taking? Authorizing Provider  ALPRAZolam Duanne Moron) 0.5 MG tablet Take 0.5 tablets (0.25 mg total) by mouth 2 (two) times daily as needed for anxiety or sleep.  08/29/15  Yes Binnie Rail, MD  aspirin 81 MG tablet Take 81 mg by mouth daily.   Yes Historical Provider, MD  Calcium Citrate-Vitamin D (CITRACAL + D PO) Take 1,000 mg by mouth daily.   Yes Historical Provider, MD  cholecalciferol (VITAMIN D) 1000 UNITS tablet Take 1,000 Units by mouth 2 (two) times daily.    Yes Historical Provider, MD  fish oil-omega-3 fatty acids 1000 MG capsule Take 2 g by mouth daily.   Yes Historical Provider, MD  hydrochlorothiazide (MICROZIDE) 12.5 MG capsule Take 12.5 mg by mouth daily as needed (swelling).   Yes Historical Provider, MD  insulin aspart (NOVOLOG FLEXPEN) 100 UNIT/ML FlexPen 1 unit for every 6 grams/CHO  Breakfast, 1 unit for every 10 grams/CHO lunch, 1 unit for every 8 grams/CHO supperCorrection glucose-120/60 at meals 02/13/15  Yes Historical Provider, MD  Insulin Glargine (LANTUS SOLOSTAR) 100 UNIT/ML Solostar Pen Inject 16 Units into the skin daily at 10 pm.  07/29/14  Yes Historical Provider, MD  levothyroxine (SYNTHROID, LEVOTHROID) 112 MCG tablet Take 1 tablet (112 mcg total) by mouth daily. Must establish with NEW PCP for additional refills. 07/29/15  Yes Rowe Clack, MD  Loperamide HCl (IMODIUM PO) Take 1 tablet by mouth daily as needed (diarrhea).    Yes Historical Provider, MD  losartan (COZAAR) 50 MG tablet Take 1 tablet (50 mg total) by mouth daily. 08/28/15  Yes Binnie Rail, MD  Multiple Vitamin (MULTIVITAMIN) tablet Take 1 tablet by mouth daily. For breast and bone health   Yes Historical Provider, MD  simvastatin (ZOCOR) 20 MG tablet Take 20 mg by mouth at bedtime.    Yes Historical Provider, MD  vitamin C (ASCORBIC ACID) 500 MG tablet Take 1,000 mg by mouth daily.   Yes Historical Provider, MD  Wheat Dextrin (BENEFIBER PO) Take by mouth. 2 tablespoons before breakfast.   Yes Historical Provider, MD  glucose blood (ONE TOUCH ULTRA TEST) test strip Used to test blood sugars 8 times a day 08/01/12   Historical Provider, MD  Psyllium  (KONSYL) 100 % POWD Take 1-3 teaspoons daily Patient not taking: Reported on 09/06/2015 08/28/15   Binnie Rail, MD    Family History  Family History  Problem Relation Age of Onset  . Cancer Sister     unknown  . Hypertension Mother   . Stroke Mother   . Arthritis Father   . Heart disease Father   . Diabetes Son     Social History  Social History   Social History  . Marital Status: Married    Spouse Name: N/A  . Number of Children: N/A  . Years of Education: N/A   Occupational History  . Not on file.  Social History Main Topics  . Smoking status: Former Smoker -- 1.00 packs/day for 10 years    Types: Cigarettes    Quit date: 08/27/1977  . Smokeless tobacco: Never Used  . Alcohol Use: Yes     Comment: rare  . Drug Use: No  . Sexual Activity: Yes    Birth Control/ Protection: Post-menopausal   Other Topics Concern  . Not on file   Social History Narrative   ** Merged History Encounter **         Review of Systems General:  No chills, fever, night sweats or weight changes.  Cardiovascular:  No chest pain, dyspnea on exertion, edema, orthopnea, palpitations, paroxysmal nocturnal dyspnea. Dermatological: No rash, lesions/masses Respiratory: No cough, dyspnea Urologic: No hematuria, dysuria Abdominal:   No nausea, vomiting, diarrhea, bright red blood per rectum, melena, or hematemesis Neurologic:  No visual changes, wkns, changes in mental status. All other systems reviewed and are otherwise negative except as noted above.  Physical Exam  Blood pressure 122/54, pulse 61, temperature 98 F (36.7 C), temperature source Oral, resp. rate 16, height _0  (1.702 m), weight 142 lb 6.7 oz (64.6 kg), SpO2 97 %.  General: Pleasant, NAD Psych: Normal affect. Neuro: Alert and oriented X 3. Moves all extremities spontaneously. HEENT: Normal  Neck: Supple without bruits or JVD. Lungs:  Resp regular and unlabored, CTA. Heart: RRR no s3, s4,, 2/6 SM at the  RUSB Abdomen: Soft, non-tender, non-distended, BS + x 4.  Extremities: No clubbing, cyanosis or edema. DP/PT/Radials 2+ and equal bilaterally.  Labs  Troponin [facility] Test)  Recent Labs  09/06/15 1216  TROPIPOC 0.00    Recent Labs  09/07/15 0107 09/07/15 0645  TROPONINI <0.03 <0.03   Lab Results  Component Value Date   WBC 6.4 09/07/2015   HGB 11.2* 09/07/2015   HCT 33.0* 09/07/2015   MCV 89.7 09/07/2015   PLT 175 09/07/2015    Recent Labs Lab 09/07/15 0107  NA 139  K 3.6  CL 106  CO2 24  BUN 8  CREATININE 0.61  CALCIUM 9.2  PROT 6.5  BILITOT 0.2*  ALKPHOS 66  ALT 11*  AST 24  GLUCOSE 135*   Lab Results  Component Value Date   CHOL 157 01/08/2015   HDL 45 01/08/2015   LDLCALC 93 01/08/2015   TRIG 96 01/08/2015   Lab Results  Component Value Date   DDIMER  02/23/2007    0.36        AT THE INHOUSE ESTABLISHED CUTOFF VALUE OF 0.48 ug/mL FEU, THIS ASSAY HAS BEEN DOCUMENTED IN THE LITERATURE TO HAVE   Radiology/Studies  Dg Chest 2 View  09/06/2015  CLINICAL DATA:  Tachycardia and mid chest pain EXAM: CHEST  2 VIEW COMPARISON:  02/15/2014 FINDINGS: The heart size and mediastinal contours are within normal limits. Both lungs are clear. The visualized skeletal structures are unremarkable. Postsurgical changes are noted in the left breast. IMPRESSION: No active cardiopulmonary disease. Electronically Signed   By: Inez Catalina M.D.   On: 09/06/2015 12:13   ECG:  NSR. No ischemia.   Cath 03/18/2012 Left mainstem: Normal.  Left anterior descending (LAD): Ostial/proximal calcification. Diffuse luminal irregularities. Proximal diagonal large and normal.  Left circumflex (LCx): AV groove mild luminal irregularities. OM1 small and normal. PL large and normal. Right coronary artery (RCA): Large. Diffuse scattered 25% lesions. PDA moderate and normal. PL large and normal Left ventriculography: Left ventricular systolic function is normal,  LVEF is estimated  at 65%, there is no significant mitral regurgitation  Final Conclusions: Mild coronary plaque. NL LV function    ASSESSMENT AND PLAN  1. Chest Pain with Moderate Risk for Cardiac Etiology: cath in 2013 showed nonobstructive CAD, however recent chest pain, tachy palpaitations and dyspnea are a bit concerning and typical of possible cardiac etiology. She has multiple risk factors including IDDM, HTN, HLD. Cardiac enzymes are negative x 3. EKG nonacute. Will risk stratify with a NST.   2. Cardiac Murmur: 2/6 SM heard at RUSB. Last echo was in 2015. Trivial TR was noted. No AS/AI at that time. Can consider repeat OP echo.   3. HTN: BP well controlled on losartan.   4. HLD: insulin dependent. Per primary team.   5. HLD: on simvastatin.   Signed, Lyda Jester, PA-C 09/07/2015, 9:07 AM   The patient was seen, examined and discussed with Brittainy M. Rosita Fire, PA-C and I agree with the above.   A very pleasant 82 year old female with known mild non-obstructive CAD on a cath in 2013, followed by Dr. Lovena Le, with a history of IDDM, HTN, HLD, and unexplained syncope in the setting of a non-ST elevation MI in 2013. This had occurred all after undergoing radiation therapy for breast cancer. LVEF was 65%. She has had no recurrent syncope/ near syncope.  She presented with palpitations and chest pain, ECG is normal and unchanged, troponin negative, she is ow chest pain free. This was most probably another episode of a-fib with RVR. We will discharge her, arrange for an outpatient exercise nuclear stress test, echocardiogram (murmur), 30 day event monitor, and follow up with Dr Lovena Le.  We will prescribe short acting cardizem 120 mg po x1 PRN for palpitations as a pill in the pocket.   Dorothy Spark 09/07/2015

## 2015-09-07 NOTE — Discharge Summary (Signed)
Physician Discharge Summary  [name] [id number] DOB: August 19, 1928 DOA: 09/06/2015  PCP: Binnie Rail, MD  Admit date: 09/06/2015 Discharge date: 09/07/2015  Time spent: 45 minutes  Recommendations for Outpatient Follow-up:  Patient will be discharged to home.  Patient will need to follow up with primary care provider within one week of discharge.  Patient will need outpatient stress testing, echocardiogram, and event monitor, which will be arranged by cardiology.  She will need to follow up with Dr. Lovena Le.  Patient should continue medications as prescribed.  Patient should follow a Heart healthy/carb modifieid diet.   Discharge Diagnoses:  Chest pain/palpitations Diabetes mellitus, type I Essential hypertension Stage I breast cancer Umbilical hernia  Discharge Condition: Stable  Diet recommendation: Heart healthy/carb modifieid  Filed Weights   09/06/15 2054  Weight: 64.6 kg (142 lb 6.7 oz)    History of present illness: [name] is a 82 y.o. female with PMH of Stage 1 Breast CA, DM on Insulin, Minimal non obstructive CAD on cath in 2013, h/o emergent small bowel resection in 2014 for obstruction, now with Umbilical hernia presents to the ER with the above complaints. She reports waking up this am at 5:30 with palpitations, this lasted for about 2.5hours, associated with shortness of breath, then resolved, this was followed by chest pressure/heaviness. No fevers, chills, cough, congestion, heartburn etc She called her PCP's office and was advised to come to ER, in ER BP was 199/67 on arrival, chest pressure resolved after SL nitro x2, no further palpitations since this am and [facility] was consulted for admission  Hospital Course:  Chest pain/pressure and Palpitations -EKG without acute changes and Troponins cycled and negative -LHC in 2013 with mild plaque only -in NSR, chest pain resolved at this time -Cardiology consulted and appreciated, felt this could be an  episode of atrial fibrillation with RVR. Currently patient is in sinus rhythm and is chest pain-free. recommended outpatient stress test, echocardiogram, event monitor placement. Follow-up with Dr. Osie Cheeks. Given cardizem 120mg  PO once (pill in the pocket) -TSH 2.479, free T4  0.91  Diabetes mellitus, type 1 -Resume home regimen upon discharge -Hemoglobin A1c pending  Essential hypertension -BP was 199/67 on arrival, now improved -continue cozaar HCTZ   Stage 1 Breast CA -under surveillance per Dr.Gudena  Umbilical hernia -follows with Dr.Wakefield  Hypothyroidism -Continue Synthroid  Procedures: None  Consultations: Cardiology  Discharge Exam: Filed Vitals:   09/06/15 2248 09/07/15 0450  BP: 130/60 122/54  Pulse: 74 61  Temp:  98 F (36.7 C)  Resp: 12 16     General: Well developed, well nourished, NAD, appears stated age  HEENT: NCAT, mucous membranes moist.  Neck: Supple, no JVD, no masses  Cardiovascular: S1 S2 auscultated, 2/6 SEM, RRR  Respiratory: Clear to auscultation bilaterally with equal chest rise  Abdomen: Soft, nontender, nondistended, + bowel sounds  Extremities: warm dry without cyanosis clubbing or edema  Neuro: AAOx3, nonfocal  Skin: Without rashes exudates or nodules  Psych: Normal affect and demeanor with intact judgement and insight  Discharge Instructions      Discharge Instructions    Discharge instructions    Complete by:  As directed   Patient will be discharged to home.  Patient will need to follow up with primary care provider within one week of discharge.  Patient will need outpatient stress testing, echocardiogram, and event monitor, which will be arranged by cardiology.  She will need to follow up with Dr. Lovena Le.  Patient should  continue medications as prescribed.  Patient should follow a Heart healthy/carb modifieid diet.            Medication List    STOP taking these medications        Psyllium 100 %  Powd  Commonly known as:  KONSYL      TAKE these medications        ALPRAZolam 0.5 MG tablet  Commonly known as:  XANAX  Take 0.5 tablets (0.25 mg total) by mouth 2 (two) times daily as needed for anxiety or sleep.     aspirin 81 MG tablet  Take 81 mg by mouth daily.     BENEFIBER PO  Take by mouth. 2 tablespoons before breakfast.     cholecalciferol 1000 units tablet  Commonly known as:  VITAMIN D  Take 1,000 Units by mouth 2 (two) times daily.     CITRACAL + D PO  Take 1,000 mg by mouth daily.     diltiazem 120 MG tablet  Commonly known as:  CARDIZEM  Take 1 tablet (120 mg total) by mouth once as needed (palpitations (heart racing)).     fish oil-omega-3 fatty acids 1000 MG capsule  Take 2 g by mouth daily.     hydrochlorothiazide 12.5 MG capsule  Commonly known as:  MICROZIDE  Take 12.5 mg by mouth daily as needed (swelling).     IMODIUM PO  Take 1 tablet by mouth daily as needed (diarrhea).     LANTUS SOLOSTAR 100 UNIT/ML Solostar Pen  Generic drug:  Insulin Glargine  Inject 16 Units into the skin daily at 10 pm.     levothyroxine 112 MCG tablet  Commonly known as:  SYNTHROID, LEVOTHROID  Take 1 tablet (112 mcg total) by mouth daily. Must establish with NEW PCP for additional refills.     losartan 50 MG tablet  Commonly known as:  COZAAR  Take 1 tablet (50 mg total) by mouth daily.     multivitamin tablet  Take 1 tablet by mouth daily. For breast and bone health     NOVOLOG FLEXPEN 100 UNIT/ML FlexPen  Generic drug:  insulin aspart  1 unit for every 6 grams/CHO  Breakfast, 1 unit for every 10 grams/CHO lunch, 1 unit for every 8 grams/CHO supperCorrection glucose-120/60 at meals     ONE TOUCH ULTRA TEST test strip  Generic drug:  glucose blood  Used to test blood sugars 8 times a day     simvastatin 20 MG tablet  Commonly known as:  ZOCOR  Take 20 mg by mouth at bedtime.     vitamin C 500 MG tablet  Commonly known as:  ASCORBIC ACID  Take 1,000 mg  by mouth daily.       Allergies  Allergen Reactions  . Augmentin [Amoxicillin-Pot Clavulanate] Other (See Comments)    Gi upset only no rash or hives   . Keflex [Cephalexin] Hives and Nausea Only    Stomach upset  . Prednisone     Other reaction(s): Other Makes blood sugars run high  . Hydrocodone Rash    Rash to chest , side back  . Latex Rash    Powder in gloves causes rash; "not allergic to latex just the powder"   Follow-up Information    Follow up with Cristopher Peru, MD.   Specialty:  Cardiology   Why:  our office will call you on Monday to arrange studies and follow-up with Dr. Lonzo Candy information:   [id number]  [place] [address] [phone]       Follow up with Binnie Rail, MD. Schedule an appointment as soon as possible for a visit in 1 week.   Specialty:  Internal Medicine   Why:  Hospital follow-up   Contact information:   [place] [address] [phone]        The results of significant diagnostics from this hospitalization (including imaging, microbiology, ancillary and laboratory) are listed below for reference.    Significant Diagnostic Studies: Dg Chest 2 View  09/06/2015  CLINICAL DATA:  Tachycardia and mid chest pain EXAM: CHEST  2 VIEW COMPARISON:  02/15/2014 FINDINGS: The heart size and mediastinal contours are within normal limits. Both lungs are clear. The visualized skeletal structures are unremarkable. Postsurgical changes are noted in the left breast. IMPRESSION: No active cardiopulmonary disease. Electronically Signed   By: Inez Catalina M.D.   On: 09/06/2015 12:13    Microbiology: No results found for this or any previous visit (from the past 240 hour(s)).   Labs: Basic Metabolic Panel:  Recent Labs Lab 09/06/15 1209 09/07/15 0107  NA 134* 139  K 4.7 3.6  CL 100* 106  CO2 25 24  GLUCOSE 400* 135*  BUN 13 8  CREATININE 0.71 0.61  CALCIUM 9.6 9.2   Liver Function  Tests:  Recent Labs Lab 09/06/15 1246 09/07/15 0107  AST 29 24  ALT 16 11*  ALKPHOS 84 66  BILITOT 0.7 0.2*  PROT 7.5 6.5  ALBUMIN 4.1 3.1*    Recent Labs Lab 09/06/15 1246  LIPASE 19   No results for input(s): AMMONIA in the last 168 hours. CBC:  Recent Labs Lab 09/06/15 1209 09/07/15 0107  WBC 7.4 6.4  HGB 13.3 11.2*  HCT 38.5 33.0*  MCV 89.7 89.7  PLT 193 175   Cardiac Enzymes:  Recent Labs Lab 09/07/15 0107 09/07/15 0645  TROPONINI <0.03 <0.03   BNP: BNP (last 3 results) No results for input(s): BNP in the last 8760 hours.  ProBNP (last 3 results) No results for input(s): PROBNP in the last 8760 hours.  CBG:  Recent Labs Lab 09/06/15 1654 09/06/15 2113 09/07/15 0607 09/07/15 1110  GLUCAP 132* 99 210* 269*       Signed:  Cristal Ford  [facility] 09/07/2015, 12:07 PM

## 2015-09-07 NOTE — Discharge Instructions (Signed)
Nonspecific Chest Pain  °Chest pain can be caused by many different conditions. There is always a chance that your pain could be related to something serious, such as a heart attack or a blood clot in your lungs. Chest pain can also be caused by conditions that are not life-threatening. If you have chest pain, it is very important to follow up with your health care provider. °CAUSES  °Chest pain can be caused by: °· Heartburn. °· Pneumonia or bronchitis. °· Anxiety or stress. °· Inflammation around your heart (pericarditis) or lung (pleuritis or pleurisy). °· A blood clot in your lung. °· A collapsed lung (pneumothorax). It can develop suddenly on its own (spontaneous pneumothorax) or from trauma to the chest. °· Shingles infection (varicella-zoster virus). °· Heart attack. °· Damage to the bones, muscles, and cartilage that make up your chest wall. This can include: °¨ Bruised bones due to injury. °¨ Strained muscles or cartilage due to frequent or repeated coughing or overwork. °¨ Fracture to one or more ribs. °¨ Sore cartilage due to inflammation (costochondritis). °RISK FACTORS  °Risk factors for chest pain may include: °· Activities that increase your risk for trauma or injury to your chest. °· Respiratory infections or conditions that cause frequent coughing. °· Medical conditions or overeating that can cause heartburn. °· Heart disease or family history of heart disease. °· Conditions or health behaviors that increase your risk of developing a blood clot. °· Having had chicken pox (varicella zoster). °SIGNS AND SYMPTOMS °Chest pain can feel like: °· Burning or tingling on the surface of your chest or deep in your chest. °· Crushing, pressure, aching, or squeezing pain. °· Dull or sharp pain that is worse when you move, cough, or take a deep breath. °· Pain that is also felt in your back, neck, shoulder, or arm, or pain that spreads to any of these areas. °Your chest pain may come and go, or it may stay  constant. °DIAGNOSIS °Lab tests or other studies may be needed to find the cause of your pain. Your health care provider may have you take a test called an ambulatory ECG (electrocardiogram). An ECG records your heartbeat patterns at the time the test is performed. You may also have other tests, such as: °· Transthoracic echocardiogram (TTE). During echocardiography, sound waves are used to create a picture of all of the heart structures and to look at how blood flows through your heart. °· Transesophageal echocardiogram (TEE). This is a more advanced imaging test that obtains images from inside your body. It allows your health care provider to see your heart in finer detail. °· Cardiac monitoring. This allows your health care provider to monitor your heart rate and rhythm in real time. °· Holter monitor. This is a portable device that records your heartbeat and can help to diagnose abnormal heartbeats. It allows your health care provider to track your heart activity for several days, if needed. °· Stress tests. These can be done through exercise or by taking medicine that makes your heart beat more quickly. °· Blood tests. °· Imaging tests. °TREATMENT  °Your treatment depends on what is causing your chest pain. Treatment may include: °· Medicines. These may include: °¨ Acid blockers for heartburn. °¨ Anti-inflammatory medicine. °¨ Pain medicine for inflammatory conditions. °¨ Antibiotic medicine, if an infection is present. °¨ Medicines to dissolve blood clots. °¨ Medicines to treat coronary artery disease. °· Supportive care for conditions that do not require medicines. This may include: °¨ Resting. °¨ Applying heat   or cold packs to injured areas. °¨ Limiting activities until pain decreases. °HOME CARE INSTRUCTIONS °· If you were prescribed an antibiotic medicine, finish it all even if you start to feel better. °· Avoid any activities that bring on chest pain. °· Do not use any tobacco products, including  cigarettes, chewing tobacco, or electronic cigarettes. If you need help quitting, ask your health care provider. °· Do not drink alcohol. °· Take medicines only as directed by your health care provider. °· Keep all follow-up visits as directed by your health care provider. This is important. This includes any further testing if your chest pain does not go away. °· If heartburn is the cause for your chest pain, you may be told to keep your head raised (elevated) while sleeping. This reduces the chance that acid will go from your stomach into your esophagus. °· Make lifestyle changes as directed by your health care provider. These may include: °¨ Getting regular exercise. Ask your health care provider to suggest some activities that are safe for you. °¨ Eating a heart-healthy diet. A registered dietitian can help you to learn healthy eating options. °¨ Maintaining a healthy weight. °¨ Managing diabetes, if necessary. °¨ Reducing stress. °SEEK MEDICAL CARE IF: °· Your chest pain does not go away after treatment. °· You have a rash with blisters on your chest. °· You have a fever. °SEEK IMMEDIATE MEDICAL CARE IF:  °· Your chest pain is worse. °· You have an increasing cough, or you cough up blood. °· You have severe abdominal pain. °· You have severe weakness. °· You faint. °· You have chills. °· You have sudden, unexplained chest discomfort. °· You have sudden, unexplained discomfort in your arms, back, neck, or jaw. °· You have shortness of breath at any time. °· You suddenly start to sweat, or your skin gets clammy. °· You feel nauseous or you vomit. °· You suddenly feel light-headed or dizzy. °· Your heart begins to beat quickly, or it feels like it is skipping beats. °These symptoms may represent a serious problem that is an emergency. Do not wait to see if the symptoms will go away. Get medical help right away. Call your local emergency services (911 in the [place] Do not drive yourself to the hospital. °  °This  information is not intended to replace advice given to you by your health care provider. Make sure you discuss any questions you have with your health care provider. °  °Document Released: 05/06/2005 Document Revised: 08/17/2014 Document Reviewed: 03/02/2014 °Elsevier Interactive Patient Education ©2016 [employer] ° °

## 2015-09-07 NOTE — Plan of Care (Signed)
Problem: Phase II Progression Outcomes Goal: Stress Test if indicated Outcome: Not Applicable Date Met:  83/15/17 Plan is to schedule outpatient stress test

## 2015-09-09 ENCOUNTER — Telehealth: Payer: Self-pay | Admitting: Physician Assistant

## 2015-09-09 LAB — HEMOGLOBIN A1C
HEMOGLOBIN A1C: 7.2 % — AB (ref 4.8–5.6)
Mean Plasma Glucose: 160 mg/dL

## 2015-09-09 NOTE — Telephone Encounter (Signed)
New message   Pt was at [place] 1/27/20017 and she wants to speak to rn about her instructions from Dr. Meda Coffee  She said she is fasting for a procedure  And she is diabetic and she is waiting for rn to call her

## 2015-09-09 NOTE — Telephone Encounter (Signed)
Patient explains that she was told that she would be having stress testing this morning and has been fasting. Apologized for the misunderstanding and advised patient that she may eat breakfast and take care of her diabetic needs. Explained that our office will call her to arrange this testing, but that testing will not take place today.  She verbalized understanding and agreeable to plan.

## 2015-09-10 ENCOUNTER — Telehealth: Payer: Self-pay | Admitting: Internal Medicine

## 2015-09-10 NOTE — Telephone Encounter (Signed)
Patient contacted regarding discharge from [facility] on 09/07/15.  Patient understands to follow up with provider Richardson Dopp PA-C on 09/11/15 at 11:50 am at [facility].  Patient understands discharge instructions? yes Patient understands medications and regiment? Yes.  Pt however states she refuses to take newly prescribed cardizem (pill in a pocket) PRN for palpitations.  Pt states she read on the [place] clinic website that this medication is contraindicated while taking simvastatin.  Pt states she will not take this at all, and will discuss this more in depth at her OV with [facility] tomorrow.  Patient understands to bring all medications to this visit? yes  Pt has no further complaints or questions at this time.

## 2015-09-10 NOTE — Telephone Encounter (Signed)
New message    TOC appt with Richardson Dopp on 2.1.2017 @ 11:50 am

## 2015-09-10 NOTE — Progress Notes (Signed)
Cardiology Office Note:    Date:  09/11/2015   ID:  [name], DOB 12/08/1928, MRN [id number]  PCP:  Binnie Rail, MD  Cardiologist/Electrophysiologist:  Dr. Cristopher Peru   Chief Complaint  Patient presents with  . Hospitalization Follow-up    chest pain, palpitations    History of Present Illness:     [name] is a 82 y.o. female with a hx of DM1, HTN, HL, breast CA, remote PAF.  She was admitted with chest pain and unexplained syncope in 2013.  [facility] and LHC at [facility] showed luminal irregularities but no significant CAD.  Etiology of syncope was thought to be related to radiation therapy for her breast CA.    Admitted 1/27-1/28 with rapid palpitations and chest pain.  MI was ruled out.  She was seen by Dr. Meda Coffee and given prn Diltiazem to use for palpitations.  OP nuclear stress test, Echo and event monitor are to be arranged with FU with Dr. Cristopher Peru.  When she was called by the RN yesterday, she had a lot of confusion about her medications.  She was brought in for further evaluation.    She is concerned about potential interactions between Diltiazem and Simvastatin.  She never got the medication. She denies any further palpitations.  She denies significant dyspnea. She denies any further chest pain. However, she has osteoporosis and has occasion pain from her spine that seems to radiate to her chest.  She denies syncope. She denies orthopnea, PND, edema.     Past Medical History  Diagnosis Date  . Hyponatremia   . Hypothyroidism   . Diabetes mellitus     type wears insulin pump  . GERD (gastroesophageal reflux disease)   . Arthritis     fingers  . Hypothyroid   . Hypercholesteremia   . Hypertension   . Diabetes mellitus type 1 (HCC)     type I- wears insulin pump  . Osteoarthritis   . PAF (paroxysmal atrial fibrillation) ([place])     One episode in 2008, spontaneously converted in the ED, no further treatment  .  Thyroid nodule dx 07/2011    [place] q 22moto follow calcified L thyroid nodule (12/08/11 [place]  . Osteoporosis, post-menopausal     DEXA 12/02/11: -3.5 (with lomax gyn), declines bisphos due to bone pain  . Spondylosis     thoracic spine  . [place]    ER +  PR +  Her 2 -  Ki67 9%   0.9 cm invasive lobular  Carcinoma ,s/p central lumpectomy with sentinel node biopsy,  ER/PR positive s/p re-excion on 09/16/11 with final pathology showing atypical hyperplasia     Past Surgical History  Procedure Laterality Date  . Cardiovascular stress test  03/17/2012  . Breast surgery      biopsy  . Breast lumpectomy    . Appendectomy    . Abdominal hysterectomy    . Bladder repair  5 yrs ago  . Tonsillectomy   age 82 . Appendectomy  age 82 . Abdominal hysterectomy  yrs ago  . Breast surgery   yrs ago    br bx  . Breast surgery  01/ 23/2013    left central lumpectomy and snbx, re-excision lumpectomy  . Left heart catheterization with coronary angiogram N/A 03/18/2012    Procedure: LEFT HEART CATHETERIZATION WITH CORONARY ANGIOGRAM;  Surgeon: JMinus Breeding MD;  Location: [place] CATH LAB;  Service: Cardiovascular;  Laterality: N/A;    Current Medications: Outpatient Prescriptions Prior to Visit  Medication Sig Dispense Refill  . ALPRAZolam (XANAX) 0.5 MG tablet Take 0.5 tablets (0.25 mg total) by mouth 2 (two) times daily as needed for anxiety or sleep. 30 tablet 5  . aspirin 81 MG tablet Take 81 mg by mouth daily.    . Calcium Citrate-Vitamin D (CITRACAL + D PO) Take 1,000 mg by mouth daily.    . cholecalciferol (VITAMIN D) 1000 UNITS tablet Take 1,000 Units by mouth 2 (two) times daily.     . fish oil-omega-3 fatty acids 1000 MG capsule Take 2 g by mouth daily.    [name] glucose blood (ONE TOUCH ULTRA TEST) test strip Used to test blood sugars 8 times a day    . hydrochlorothiazide (MICROZIDE) 12.5 MG capsule Take 12.5 mg by mouth daily as needed (swelling).     . insulin aspart (NOVOLOG FLEXPEN) 100 UNIT/ML FlexPen 1 unit for every 6 grams/CHO  Breakfast, 1 unit for every 10 grams/CHO lunch, 1 unit for every 8 grams/CHO supperCorrection glucose-120/60 at meals    . Insulin Glargine (LANTUS SOLOSTAR) 100 UNIT/ML Solostar Pen Inject 16 Units into the skin daily at 10 pm.     . levothyroxine (SYNTHROID, LEVOTHROID) 112 MCG tablet Take 1 tablet (112 mcg total) by mouth daily. Must establish with NEW PCP for additional refills. 90 tablet 1  . Loperamide HCl (IMODIUM PO) Take 1 tablet by mouth daily as needed (diarrhea).     . losartan (COZAAR) 50 MG tablet Take 1 tablet (50 mg total) by mouth daily. 90 tablet 3  . Multiple Vitamin (MULTIVITAMIN) tablet Take 1 tablet by mouth daily. For breast and bone health    . simvastatin (ZOCOR) 20 MG tablet Take 20 mg by mouth at bedtime.     . vitamin C (ASCORBIC ACID) 500 MG tablet Take 1,000 mg by mouth daily.    . Wheat Dextrin (BENEFIBER PO) Take by mouth. 2 tablespoons before breakfast.    . diltiazem (CARDIZEM) 120 MG tablet Take 1 tablet (120 mg total) by mouth once as needed (palpitations (heart racing)). (Patient not taking: Reported on 09/11/2015) 30 tablet 5   No facility-administered medications prior to visit.     Allergies:   Augmentin; Keflex; Prednisone; Hydrocodone; and Latex   Social History   Social History  . Marital Status: Married    Spouse Name: N/A  . Number of Children: N/A  . Years of Education: N/A   Social History Main Topics  . Smoking status: Former Smoker -- 1.00 packs/day for 10 years    Types: Cigarettes    Quit date: 08/27/1977  . Smokeless tobacco: Never Used  . Alcohol Use: Yes     Comment: rare  . Drug Use: No  . Sexual Activity: Yes    Birth Control/ Protection: Post-menopausal   Other Topics Concern  . Not on file   Social History Narrative   ** Merged History Encounter **         Family History:  The patient's family history includes Arthritis in her  father; Cancer in her sister; Diabetes in her son; Heart disease in her father; Hypertension in her mother; Stroke in her mother.   ROS:   Please see the history of present illness.    Review of Systems  Constitution: Positive for malaise/fatigue.  Eyes: Positive for visual disturbance.  Cardiovascular: Positive for chest pain and irregular heartbeat.  Respiratory: Positive for cough.   Musculoskeletal: Positive for joint pain and myalgias.  Gastrointestinal: Positive for abdominal pain and diarrhea.  Psychiatric/Behavioral: The patient is nervous/anxious.   All other systems reviewed and are negative.   Physical Exam:    VS:  BP 150/76 mmHg  Pulse 74  Ht '5\' 7"'$  (1.702 m)  Wt 144 lb (65.318 kg)  BMI 22.55 kg/m2   GEN: Well nourished, well developed, in no acute distress HEENT: normal Neck: no JVD, no masses Cardiac: Normal S1/S2, RRR; no murmurs, no edema, DP/PT 2+ bilaterally    Respiratory:  clear to auscultation bilaterally; no wheezing, rhonchi or rales GI: soft, nontender,  MS: no deformity or atrophy Skin: warm and dry  Neuro: no focal deficits  Psych: Alert and oriented x 3, normal affect  Wt Readings from Last 3 Encounters:  09/11/15 144 lb (65.318 kg)  09/06/15 142 lb 6.7 oz (64.6 kg)  08/28/15 148 lb (67.132 kg)      Studies/Labs Reviewed:     EKG:  EKG is  ordered today.  The ekg ordered today demonstrates NSR, HR 74, normal axis, QTC 461 ms, no change from prior tracing  Recent Labs: 09/06/2015: TSH 2.479 09/07/2015: ALT 11*; BUN 8; Creatinine, Ser 0.61; Hemoglobin 11.2*; Platelets 175; Potassium 3.6; Sodium 139   Recent Lipid Panel    Component Value Date/Time   CHOL 157 01/08/2015   TRIG 96 01/08/2015   HDL 45 01/08/2015   CHOLHDL 4 03/05/2014 0936   VLDL 15.8 03/05/2014 0936   LDLCALC 93 01/08/2015    Additional studies/ records that were reviewed today include:   Echo 05/07/14 EF 60-65%  LHC 8/13 LM: Normal.  LAD: Ostial/proximal  calcification. Diffuse luminal irregularities. Proximal diagonal large and normal.  LCx: AV groove mild luminal irregularities. OM1 small and normal. PL large and normal. RCA: Large. Diffuse scattered 25% lesions. PDA moderate and normal. PL large and normal Left ventriculography: LVSF normal, LVEF is estimated at 65%, there is no significant mitral regurgitation  Final Conclusions: Mild coronary plaque. NL LV function Recommendations: Plan medical management.   ASSESSMENT:     1. Palpitations   2. Precordial pain   3. Essential hypertension   4. Hypercholesteremia   5. Minimal Coronary Plaque at [facility] in 2013     PLAN:     In order of problems listed above:  1. Palpitations - No recurrent symptoms. The patient is concerned about taking Diltiazem prn given interaction with Simvastatin.  We discussed the recommended dose of Simvastatin with Diltiazem (Simvastatin 10 mg) as well as substituting Simvastatin with a different drug or changing Diltiazem to a beta-blocker. She prefers to try a beta-blocker instead of the Diltiazem.  Event monitor is pending.  She does have a hx of PAF.  If AF documented, she will need anticoagulation.  CHADS2-VASc=5 (age, female, HTN, DM).  Echo is pending.    -  DC Diltiazem  -  Start Metoprolol Tartrate 25-50 mg prn  -  FU Echo, Event Monitor as planned.  Return to see Dr. Cristopher Peru after testing complete.   2. Chest pain - Recent admit with CP.  CEs were neg.  She had a Cath in 2013 with luminal irregs and no significant CAD other than 25% scattered plaque in the RCA.  Given her advanced age at the time of her cath in 2013, it is doubtful that she has developed significant obstructive CAD.  However, it has been over 3 yeas since her cath and  she does have IDDM.  Nuclear stress test is pending.  She has had some atypical chest pain since DC.  ECG is normal.   3. HTN - BP borderline elevated.  Continue current Rx.  Continue to monitor.   4. HL  - Continue statin.  FU with PCP.  5. CAD - As noted, she had insignificant plaque at [facility] in 2013.  Proceed with stress testing as noted.  Continue ASA, statin.     Medication Adjustments/Labs and Tests Ordered: Current medicines are reviewed at length with the patient today.  Concerns regarding medicines are outlined above.  Medication changes, Labs and Tests ordered today are outlined in the Patient Instructions noted below. Patient Instructions  Medication Instructions:  Stop the Diltiazem.  Start Metoprolol Tartrate 50 mg as needed for palpitations.  You can take 1/2 tab to start. If the palpitations continue after 30 minutes to an hour, you can take another 1/2 tab.  If the palpitations continue, call our office.    Labwork: None today.  Testing/Procedures: You have been set up for a stress test, echocardiogram and an event monitor. Proceed with these tests as planned.   Follow-Up: Dr. Cristopher Peru 10/22/15 @ 11:45   Any Other Special Instructions Will Be Listed Below (If Applicable).      Signed, Richardson Dopp, PA-C  09/11/2015 12:50 PM    [facility] [address], [place], [place]  [address] Phone: [phone]; Fax: [phone]

## 2015-09-11 ENCOUNTER — Ambulatory Visit (INDEPENDENT_AMBULATORY_CARE_PROVIDER_SITE_OTHER): Payer: Medicare Other | Admitting: Physician Assistant

## 2015-09-11 ENCOUNTER — Encounter: Payer: Self-pay | Admitting: Physician Assistant

## 2015-09-11 VITALS — BP 150/76 | HR 74 | Ht 67.0 in | Wt 144.0 lb

## 2015-09-11 DIAGNOSIS — I2510 Atherosclerotic heart disease of native coronary artery without angina pectoris: Secondary | ICD-10-CM

## 2015-09-11 DIAGNOSIS — E7800 Pure hypercholesterolemia, unspecified: Secondary | ICD-10-CM

## 2015-09-11 DIAGNOSIS — R072 Precordial pain: Secondary | ICD-10-CM

## 2015-09-11 DIAGNOSIS — I10 Essential (primary) hypertension: Secondary | ICD-10-CM | POA: Diagnosis not present

## 2015-09-11 DIAGNOSIS — R002 Palpitations: Secondary | ICD-10-CM | POA: Diagnosis not present

## 2015-09-11 MED ORDER — METOPROLOL TARTRATE 50 MG PO TABS: ORAL_TABLET | ORAL | Status: DC

## 2015-09-11 NOTE — Patient Instructions (Addendum)
Medication Instructions:  Stop the Diltiazem.  Start Metoprolol Tartrate 50 mg as needed for palpitations.  You can take 1/2 tab to start. If the palpitations continue after 30 minutes to an hour, you can take another 1/2 tab.  If the palpitations continue, call our office.    Labwork: None today.  Testing/Procedures: You have been set up for a stress test, echocardiogram and an event monitor. Proceed with these tests as planned.   Follow-Up: Dr. Cristopher Peru 10/22/15 @ 11:45   Any Other Special Instructions Will Be Listed Below (If Applicable).

## 2015-09-12 ENCOUNTER — Telehealth (HOSPITAL_COMMUNITY): Payer: Self-pay

## 2015-09-12 NOTE — Telephone Encounter (Signed)
Encounter complete. 

## 2015-09-13 ENCOUNTER — Ambulatory Visit (HOSPITAL_COMMUNITY)
Admission: RE | Admit: 2015-09-13 | Discharge: 2015-09-13 | Disposition: A | Discharge status: Home / Self Care | Payer: Medicare Other | Source: Ambulatory Visit | Attending: Cardiovascular Disease | Admitting: Cardiovascular Disease

## 2015-09-13 ENCOUNTER — Ambulatory Visit (INDEPENDENT_AMBULATORY_CARE_PROVIDER_SITE_OTHER): Payer: Medicare Other | Admitting: Internal Medicine

## 2015-09-13 ENCOUNTER — Encounter: Payer: Self-pay | Admitting: Internal Medicine

## 2015-09-13 VITALS — BP 164/72 | HR 73 | Temp 97.8°F | Resp 18 | Wt 146.0 lb

## 2015-09-13 DIAGNOSIS — R9439 Abnormal result of other cardiovascular function study: Secondary | ICD-10-CM | POA: Diagnosis not present

## 2015-09-13 DIAGNOSIS — R002 Palpitations: Secondary | ICD-10-CM | POA: Insufficient documentation

## 2015-09-13 DIAGNOSIS — I10 Essential (primary) hypertension: Secondary | ICD-10-CM

## 2015-09-13 DIAGNOSIS — Z8249 Family history of ischemic heart disease and other diseases of the circulatory system: Secondary | ICD-10-CM | POA: Diagnosis not present

## 2015-09-13 DIAGNOSIS — R079 Chest pain, unspecified: Secondary | ICD-10-CM | POA: Insufficient documentation

## 2015-09-13 DIAGNOSIS — R0602 Shortness of breath: Secondary | ICD-10-CM | POA: Diagnosis not present

## 2015-09-13 DIAGNOSIS — E119 Type 2 diabetes mellitus without complications: Secondary | ICD-10-CM | POA: Insufficient documentation

## 2015-09-13 DIAGNOSIS — Z87891 Personal history of nicotine dependence: Secondary | ICD-10-CM | POA: Insufficient documentation

## 2015-09-13 DIAGNOSIS — R55 Syncope and collapse: Secondary | ICD-10-CM | POA: Insufficient documentation

## 2015-09-13 LAB — MYOCARDIAL PERFUSION IMAGING
CHL CUP NUCLEAR SSS: 5
CSEPPHR: 88 {beats}/min
LV dias vol: 69 mL
LVSYSVOL: 24 mL
NUC STRESS TID: 1.07
Rest HR: 68 {beats}/min
SDS: 5
SRS: 0

## 2015-09-13 MED ORDER — TECHNETIUM TC 99M SESTAMIBI GENERIC - CARDIOLITE
31.9000 | Freq: Once | INTRAVENOUS | Status: AC | PRN
  Administered 2015-09-13: 31.9 via INTRAVENOUS

## 2015-09-13 MED ORDER — LOSARTAN POTASSIUM 100 MG PO TABS: 100.0000 mg | ORAL_TABLET | Freq: Every day | ORAL | Status: DC

## 2015-09-13 MED ORDER — REGADENOSON 0.4 MG/5ML IV SOLN
0.4000 mg | Freq: Once | INTRAVENOUS | Status: AC
  Administered 2015-09-13: 0.4 mg via INTRAVENOUS

## 2015-09-13 MED ORDER — TECHNETIUM TC 99M SESTAMIBI GENERIC - CARDIOLITE
10.6000 | Freq: Once | INTRAVENOUS | Status: AC | PRN
  Administered 2015-09-13: 10.6 via INTRAVENOUS

## 2015-09-13 NOTE — Progress Notes (Signed)
Subjective:    Patient ID: [name], female    DOB: 05-20-1929, 82 y.o.   MRN: [id number]  HPI She is here for hospital follow up.  She was admitted 09/06/15 and discharged 09/07/15.  She went to the ED with palpitations.  She woke up the morning of admission at 5:30 am with palpitations that lasted 2.5 hours.  She had associated SOB and then chest pressure/heaviness.  She denies any fever, cough, congestion, GERD.  Her BP on arrival was 199/67.  Her chest pressure resolved with SL nitro x2.  She was not having palpitations in the ED.    Her EKG showed no changes and her troponins were negative.  Cardiology saw the patient and thought she may have had afib.  They recommended an outpatient stress test, echo and event monitor.  She was given cardizem 120 mg once in the hospital.  Her BP improved during the hospital course.   She was discharged home and saw cardiology 09/11/15.  She was changed to metoprolol 25-50 mg as needed for palpitations.  The event monitor is pending.  She does have a history of PAF and if documented will need anticoagulation.  Echo pending.  Stress test is today.  She has not had any palpitations since being discharged. She had one episode of mild left-sided chest pain 5 days ago that was transient. She denies any shortness of breath. She denies any headaches, lightheadedness or dizziness. She has a mild cough, but believes that is from a mild cold. She does not have any wheezing or fevers.  She will be getting a new blood pressure cuff today so that she can monitor her blood pressure. It was elevated at her cardiology appointment 2 days ago and is still elevated here. She is taking her medication daily. She takes the hydrochlorothiazide only as needed when she has leg swelling, but taking it on a daily basis cause low sodium.  Chest x-ray, 1/27: No active cardiopulmonary disease EKG, 1/27: No changes compared to prior EKG Blood work from the hospital was unremarkable.  Troponins were negative. A1c was 7.2%. Very function was normal  Medications and allergies reviewed with patient and no updates were necessary.  Patient Active Problem List   Diagnosis Date Noted  . Minimal Coronary Plaque by Cardiac Cath in 2013 09/11/2015  . Palpitations 09/06/2015  . Frequent loose stools 08/28/2015  . Hyponatremia   . Acute mesenteric ischemia ([place])   . Hypothyroidism 07/11/2012  . Precordial pain 03/18/2012  . Syncope 03/18/2012  . Abnormal LFTs   . Hypercholesteremia   . Hypertension   . Diabetes mellitus type 1 ([place])   . Osteoarthritis   . Thyroid nodule   . Osteoporosis, post-menopausal   . Primary cancer of upper outer quadrant of left female breast ([place]) 08/21/2011    Current Outpatient Prescriptions on File Prior to Visit  Medication Sig Dispense Refill  . ALPRAZolam (XANAX) 0.5 MG tablet Take 0.5 tablets (0.25 mg total) by mouth 2 (two) times daily as needed for anxiety or sleep. 30 tablet 5  . aspirin 81 MG tablet Take 81 mg by mouth daily.    . Calcium Citrate-Vitamin D (CITRACAL + D PO) Take 1,000 mg by mouth daily.    . cholecalciferol (VITAMIN D) 1000 UNITS tablet Take 1,000 Units by mouth 2 (two) times daily.     . fish oil-omega-3 fatty acids 1000 MG capsule Take 2 g by mouth daily.    [name] glucose blood (ONE  TOUCH ULTRA TEST) test strip Used to test blood sugars 8 times a day    . hydrochlorothiazide (MICROZIDE) 12.5 MG capsule Take 12.5 mg by mouth daily as needed (swelling).    . insulin aspart (NOVOLOG FLEXPEN) 100 UNIT/ML FlexPen 1 unit for every 6 grams/CHO  Breakfast, 1 unit for every 10 grams/CHO lunch, 1 unit for every 8 grams/CHO supperCorrection glucose-120/60 at meals    . Insulin Glargine (LANTUS SOLOSTAR) 100 UNIT/ML Solostar Pen Inject 16 Units into the skin daily at 10 pm.     . levothyroxine (SYNTHROID, LEVOTHROID) 112 MCG tablet Take 1 tablet (112 mcg total) by mouth daily. Must establish with NEW PCP for additional refills. 90  tablet 1  . Loperamide HCl (IMODIUM PO) Take 1 tablet by mouth daily as needed (diarrhea).     . losartan (COZAAR) 50 MG tablet Take 1 tablet (50 mg total) by mouth daily. 90 tablet 3  . metoprolol (LOPRESSOR) 50 MG tablet Take 1/2 tab as needed for rapid palpitations.  May repeat in 1 hour if palpitations continue. 30 tablet 2  . Multiple Vitamin (MULTIVITAMIN) tablet Take 1 tablet by mouth daily. For breast and bone health    . simvastatin (ZOCOR) 20 MG tablet Take 20 mg by mouth at bedtime.     . vitamin C (ASCORBIC ACID) 500 MG tablet Take 1,000 mg by mouth daily.    . Wheat Dextrin (BENEFIBER PO) Take by mouth. 2 tablespoons before breakfast.     No current facility-administered medications on file prior to visit.    Past Medical History  Diagnosis Date  . Hyponatremia   . Hypothyroidism   . Diabetes mellitus     type wears insulin pump  . GERD (gastroesophageal reflux disease)   . Arthritis     fingers  . Hypothyroid   . Hypercholesteremia   . Hypertension   . Diabetes mellitus type 1 (HCC)     type I- wears insulin pump  . Osteoarthritis   . PAF (paroxysmal atrial fibrillation) ([place])     One episode in 2008, spontaneously converted in the ED, no further treatment  . Thyroid nodule dx 07/2011    [place] q 29moto follow calcified L thyroid nodule (12/08/11 [place]  . Osteoporosis, post-menopausal     DEXA 12/02/11: -3.5 (with lomax gyn), declines bisphos due to bone pain  . Spondylosis     thoracic spine  . [place]    ER +  PR +  Her 2 -  Ki67 9%   0.9 cm invasive lobular  Carcinoma ,s/p central lumpectomy with sentinel node biopsy,  ER/PR positive s/p re-excion on 09/16/11 with final pathology showing atypical hyperplasia     Past Surgical History  Procedure Laterality Date  . Cardiovascular stress test  03/17/2012  . Breast surgery      biopsy  . Breast lumpectomy    . Appendectomy    . Abdominal hysterectomy    . Bladder  repair  5 yrs ago  . Tonsillectomy   age 82 . Appendectomy  age 89 . Abdominal hysterectomy  yrs ago  . Breast surgery   yrs ago    br bx  . Breast surgery  01/ 23/2013    left central lumpectomy and snbx, re-excision lumpectomy  . Left heart catheterization with coronary angiogram N/A 03/18/2012    Procedure: LEFT HEART CATHETERIZATION WITH CORONARY ANGIOGRAM;  Surgeon: JMinus Breeding MD;  [facility];  Service: Cardiovascular;  Laterality: N/A;    Social History   Social History  . Marital Status: Married    Spouse Name: N/A  . Number of Children: N/A  . Years of Education: N/A   Social History Main Topics  . Smoking status: Former Smoker -- 1.00 packs/day for 10 years    Types: Cigarettes    Quit date: 08/27/1977  . Smokeless tobacco: Never Used  . Alcohol Use: Yes     Comment: rare  . Drug Use: No  . Sexual Activity: Yes    Birth Control/ Protection: Post-menopausal   Other Topics Concern  . None   Social History Narrative   ** Merged History Encounter **        Family History  Problem Relation Age of Onset  . Cancer Sister     unknown  . Hypertension Mother   . Stroke Mother   . Arthritis Father   . Heart disease Father   . Diabetes Son     Review of Systems  Constitutional: Negative for fever.  HENT: Positive for congestion.   Respiratory: Positive for cough (URI). Negative for shortness of breath and wheezing.   Cardiovascular: Positive for chest pain (one episode 5 days ago - transient). Negative for palpitations and leg swelling.  Neurological: Negative for dizziness, light-headedness and headaches.       Objective:   Filed Vitals:   09/13/15 1047  BP: 164/72  Pulse: 73  Temp: 97.8 F (36.6 C)  Resp: 18   Filed Weights   09/13/15 1047  Weight: 146 lb (66.225 kg)   Body mass index is 22.86 kg/(m^2).   Physical Exam Constitutional: Appears well-developed and well-nourished. No distress.  Neck: Neck supple. No tracheal  deviation present. No thyromegaly present.  No carotid bruit. No cervical adenopathy.   Cardiovascular: Normal rate, regular rhythm and normal heart sounds.   2/6 systolic murmur .  No edema Pulmonary/Chest: Effort normal and breath sounds normal. No respiratory distress. No wheezes.        Assessment & Plan:   She is here for follow-up from the hospital and initiated been experiencing palpitations, shortness of breath and chest pain  evaluation in the hospital was negative She has an echocardiogram, stress test and Holter monitor scheduled and has are seen cardiology She has had one episode of left-sided chest pain that was transient since being discharged, but no shortness of breath palpitations She does have a history of paroxysmal atrial fibrillation and that may have been what she experienced on the day of admission Cardiac workup as scheduled She does have follow-up with her cardiologist next month Her blood pressure has been elevated and is elevated here today She will be getting a new cough so she can monitor at home Increase losartan from 50 mg daily.100 mg daily Hydrochlorothiazide 12.5 mg as needed only Metoprolol as needed only for palpitations  Since she is seeing cardiology in a couple weeks they can recheck her blood pressure. It is not controlled at home and asked her to call or follow up with me as well.  Routine follow-up in 6 months, sooner if needed

## 2015-09-13 NOTE — Patient Instructions (Signed)
   Medications reviewed and updated.  Changes include increasing the losartan to 100 mg daily.   Your prescription(s) have been submitted to your pharmacy. Please take as directed and contact our office if you believe you are having problem(s) with the medication(s).   Please schedule followup in 6 months for routine follow up.

## 2015-09-13 NOTE — Progress Notes (Signed)
Pre visit review using our clinic review tool, if applicable. No additional management support is needed unless otherwise documented below in the visit note. 

## 2015-09-16 ENCOUNTER — Other Ambulatory Visit: Payer: Self-pay | Admitting: *Deleted

## 2015-09-16 NOTE — Telephone Encounter (Signed)
Medication   diltiazem (CARDIZEM) 120 MG tablet [2474]       diltiazem (CARDIZEM) 120 MG tablet [id number] DISCONTINUED     Order Details    Dose: 120 mg Route: Oral Frequency: Once PRN for palpitations (heart racing)   Dispense Quantity:  30 tablet Refills:  5 Fills Remaining:  5          Sig: Take 1 tablet (120 mg total) by mouth once as needed (palpitations (heart racing)).   Patient not taking: Reported on 09/11/2015         Discontinue Date:  09/11/2015 1226 Discontinue User:  Liliane Shi, PA-C Discontinue Reason:  Patient Preference   Written Date:  09/07/15 Expiration Date:  09/06/16     Start Date:  09/07/15 End Date:  09/11/15     Ordering Provider:  Consuelo Pandy, PA-C Authorizing Provider:  Consuelo Pandy, PA-C Ordering User:  Consuelo Pandy, PA-C

## 2015-09-19 ENCOUNTER — Ambulatory Visit (HOSPITAL_COMMUNITY): Payer: Medicare Other | Attending: Cardiology

## 2015-09-19 ENCOUNTER — Other Ambulatory Visit: Payer: Self-pay

## 2015-09-19 ENCOUNTER — Ambulatory Visit (INDEPENDENT_AMBULATORY_CARE_PROVIDER_SITE_OTHER): Payer: Medicare Other

## 2015-09-19 DIAGNOSIS — E785 Hyperlipidemia, unspecified: Secondary | ICD-10-CM | POA: Diagnosis not present

## 2015-09-19 DIAGNOSIS — R079 Chest pain, unspecified: Secondary | ICD-10-CM | POA: Diagnosis present

## 2015-09-19 DIAGNOSIS — I10 Essential (primary) hypertension: Secondary | ICD-10-CM | POA: Insufficient documentation

## 2015-09-19 DIAGNOSIS — I358 Other nonrheumatic aortic valve disorders: Secondary | ICD-10-CM | POA: Diagnosis not present

## 2015-09-19 DIAGNOSIS — I071 Rheumatic tricuspid insufficiency: Secondary | ICD-10-CM | POA: Insufficient documentation

## 2015-09-19 DIAGNOSIS — R002 Palpitations: Secondary | ICD-10-CM | POA: Diagnosis not present

## 2015-09-19 DIAGNOSIS — E119 Type 2 diabetes mellitus without complications: Secondary | ICD-10-CM | POA: Insufficient documentation

## 2015-09-24 ENCOUNTER — Ambulatory Visit: Payer: Medicare Other | Admitting: Physician Assistant

## 2015-10-22 ENCOUNTER — Ambulatory Visit (INDEPENDENT_AMBULATORY_CARE_PROVIDER_SITE_OTHER): Payer: Medicare Other | Admitting: Internal Medicine

## 2015-10-22 ENCOUNTER — Encounter: Payer: Self-pay | Admitting: Internal Medicine

## 2015-10-22 VITALS — BP 150/76 | HR 84 | Ht 67.0 in | Wt 147.0 lb

## 2015-10-22 DIAGNOSIS — R002 Palpitations: Secondary | ICD-10-CM

## 2015-10-22 NOTE — Progress Notes (Signed)
HPI [name] returns today for followup. She is a very pleasant 82 year old woman with a history of unexplained syncope in the setting of a non-ST elevation MI. This had occurred all after undergoing radiation therapy for breast cancer. She underwent catheterization which demonstrated no obstructive coronary disease. She saw [name] several weeks ago after experiencing an episode of palpitations and sob. Subsequent workup has demonstrated normal LV function, no significant ischemia on stress testing (low risk study) and her heart monitor did not show any arrhythmias. She feels well although she is a bit anxious about her symptoms.  Allergies  Allergen Reactions  . Augmentin [Amoxicillin-Pot Clavulanate] Other (See Comments)    Gi upset only no rash or hives   . Keflex [Cephalexin] Hives and Nausea Only    Stomach upset  . Prednisone     Other reaction(s): Other Makes blood sugars run high  . Hydrocodone Rash    Rash to chest , side back  . Latex Rash    Powder in gloves causes rash; "not allergic to latex just the powder"     Current Outpatient Prescriptions  Medication Sig Dispense Refill  . ALPRAZolam (XANAX) 0.5 MG tablet Take 0.5 tablets (0.25 mg total) by mouth 2 (two) times daily as needed for anxiety or sleep. 30 tablet 5  . aspirin 81 MG tablet Take 81 mg by mouth daily.    . Calcium Citrate-Vitamin D (CITRACAL + D PO) Take 1,000 mg by mouth daily.    . cholecalciferol (VITAMIN D) 1000 UNITS tablet Take 1,000 Units by mouth 2 (two) times daily.     . fish oil-omega-3 fatty acids 1000 MG capsule Take 2 g by mouth daily.    [name] glucose blood (ONE TOUCH ULTRA TEST) test strip Used to test blood sugars 8 times a day    . hydrochlorothiazide (MICROZIDE) 12.5 MG capsule Take 12.5 mg by mouth daily as needed (swelling).    . insulin aspart (NOVOLOG FLEXPEN) 100 UNIT/ML FlexPen 1 unit for every 6 grams/CHO  Breakfast, 1 unit for every 10 grams/CHO lunch, 1 unit for every 8 grams/CHO  supperCorrection glucose-120/60 at meals    . Insulin Glargine (LANTUS SOLOSTAR) 100 UNIT/ML Solostar Pen Inject 16 Units into the skin daily at 10 pm.     . levothyroxine (SYNTHROID, LEVOTHROID) 112 MCG tablet Take 1 tablet (112 mcg total) by mouth daily. Must establish with NEW PCP for additional refills. 90 tablet 1  . Loperamide HCl (IMODIUM PO) Take 1 tablet by mouth daily as needed (diarrhea).     . losartan (COZAAR) 100 MG tablet Take 1 tablet (100 mg total) by mouth daily. 90 tablet 3  . metoprolol (LOPRESSOR) 50 MG tablet Take 1/2 tab as needed for rapid palpitations.  May repeat in 1 hour if palpitations continue. 30 tablet 2  . Multiple Vitamin (MULTIVITAMIN) tablet Take 1 tablet by mouth daily. For breast and bone health    . simvastatin (ZOCOR) 20 MG tablet Take 20 mg by mouth at bedtime.     . vitamin C (ASCORBIC ACID) 500 MG tablet Take 1,000 mg by mouth daily.    . Wheat Dextrin (BENEFIBER PO) Take by mouth. 2 tablespoons before breakfast.     No current facility-administered medications for this visit.     Past Medical History  Diagnosis Date  . Hyponatremia   . Hypothyroidism   . Diabetes mellitus     type wears insulin pump  . GERD (gastroesophageal reflux disease)   . Arthritis  fingers  . Hypothyroid   . Hypercholesteremia   . Hypertension   . Diabetes mellitus type 1 (HCC)     type I- wears insulin pump  . Osteoarthritis   . PAF (paroxysmal atrial fibrillation) ([place])     One episode in 2008, spontaneously converted in the ED, no further treatment  . Thyroid nodule dx 07/2011    [place] q 52moto follow calcified L thyroid nodule (12/08/11 [place]  . Osteoporosis, post-menopausal     DEXA 12/02/11: -3.5 (with lomax gyn), declines bisphos due to bone pain  . Spondylosis     thoracic spine  . Cancer of upper-outer quadrant of [place] breast [place] 08/21/[place]    ER +  PR +  Her 2 -  Ki67 9%   0.9 cm invasive lobular  Carcinoma ,s/p central lumpectomy with sentinel node  biopsy,  ER/PR positive s/p re-excion on 09/16/11 with final pathology showing atypical hyperplasia     ROS:   All systems reviewed and negative except as noted in the HPI.   Past Surgical History  Procedure Laterality Date  . Cardiovascular stress test  03/17/2012  . Breast surgery      biopsy  . Breast lumpectomy    . Appendectomy    . Abdominal hysterectomy    . Bladder repair  5 yrs ago  . Tonsillectomy   age 89 . Appendectomy  age 82 . Abdominal hysterectomy  yrs ago  . Breast surgery   yrs ago    br bx  . Breast surgery  01/ 23/2013    left central lumpectomy and snbx, re-excision lumpectomy  . Left heart catheterization with coronary angiogram N/A 03/18/2012    Procedure: LEFT HEART CATHETERIZATION WITH CORONARY ANGIOGRAM;  Surgeon: Cheryl Breeding MD;  [facility];  Service: Cardiovascular;  Laterality: N/A;     Family History  Problem Relation Age of Onset  . Cancer Sister     unknown  . Hypertension Mother   . Stroke Mother   . Arthritis Father   . Heart disease Father   . Diabetes Son      Social History   Social History  . Marital Status: Married    Spouse Name: N/A  . Number of Children: N/A  . Years of Education: N/A   Occupational History  . Not on file.   Social History Main Topics  . Smoking status: Former Smoker -- 1.00 packs/day for 10 years    Types: Cigarettes    Quit date: 08/27/1977  . Smokeless tobacco: Never Used  . Alcohol Use: Yes     Comment: rare  . Drug Use: No  . Sexual Activity: Yes    Birth Control/ Protection: Post-menopausal   Other Topics Concern  . Not on file   Social History Narrative   ** Merged History Encounter **         BP 150/76 mmHg  Pulse 84  Ht _0  (1.702 m)  Wt 147 lb (66.679 kg)  BMI 23.02 kg/m2  Physical Exam:  Well appearing elderly woman, who looks younger than her stated age, and in NAD HEENT: Unremarkable Neck:  6 cm JVD, no thyromegally Lungs:  Clear with no wheezes,  rales, or rhonchi. HEART:  Regular rate rhythm, no murmurs, no rubs, no clicks Abd:  soft, positive bowel sounds, no organomegally, no rebound, no guarding Ext:  2 plus pulses, no edema, no cyanosis, no clubbing Skin:  No rashes no nodules Neuro:  CN II  through XII intact, motor grossly intact  Cardiac monitor - reviewed Echo - reviewed Stress test - reivewed  Assess/Plan:  1. Palpitations - the etiology of her symptoms is unclear. I have recommended she undergo watchful waiting.  2. HTN - her blood pressure is well controlled. Will follow. 3. Chest pain - she has a h/o non-obstructive heart cath. Will follow.  Mikle Bosworth.D.

## 2015-10-22 NOTE — Patient Instructions (Signed)

## 2015-12-18 ENCOUNTER — Other Ambulatory Visit: Payer: Self-pay

## 2015-12-18 ENCOUNTER — Other Ambulatory Visit: Payer: Self-pay | Admitting: Hematology and Oncology

## 2015-12-18 DIAGNOSIS — Z9889 Other specified postprocedural states: Secondary | ICD-10-CM

## 2015-12-18 DIAGNOSIS — Z853 Personal history of malignant neoplasm of breast: Secondary | ICD-10-CM

## 2015-12-30 ENCOUNTER — Ambulatory Visit
Admission: RE | Admit: 2015-12-30 | Discharge: 2015-12-30 | Disposition: A | Discharge status: Home / Self Care | Payer: Medicare Other | Source: Ambulatory Visit | Attending: Hematology and Oncology | Admitting: Hematology and Oncology

## 2015-12-30 DIAGNOSIS — Z853 Personal history of malignant neoplasm of breast: Secondary | ICD-10-CM

## 2015-12-30 DIAGNOSIS — Z9889 Other specified postprocedural states: Secondary | ICD-10-CM

## 2016-01-02 ENCOUNTER — Ambulatory Visit: Payer: Medicare Other | Admitting: Family

## 2016-01-03 ENCOUNTER — Encounter: Payer: Self-pay | Admitting: Internal Medicine

## 2016-01-03 ENCOUNTER — Ambulatory Visit (INDEPENDENT_AMBULATORY_CARE_PROVIDER_SITE_OTHER): Payer: Medicare Other | Admitting: Internal Medicine

## 2016-01-03 VITALS — BP 130/70 | HR 89 | Temp 98.9°F | Resp 20 | Wt 145.0 lb

## 2016-01-03 DIAGNOSIS — J309 Allergic rhinitis, unspecified: Secondary | ICD-10-CM

## 2016-01-03 DIAGNOSIS — I10 Essential (primary) hypertension: Secondary | ICD-10-CM

## 2016-01-03 DIAGNOSIS — H9191 Unspecified hearing loss, right ear: Secondary | ICD-10-CM

## 2016-01-03 DIAGNOSIS — J069 Acute upper respiratory infection, unspecified: Secondary | ICD-10-CM | POA: Diagnosis not present

## 2016-01-03 DIAGNOSIS — H9190 Unspecified hearing loss, unspecified ear: Secondary | ICD-10-CM | POA: Insufficient documentation

## 2016-01-03 MED ORDER — AZITHROMYCIN 250 MG PO TABS: ORAL_TABLET | ORAL | Status: DC

## 2016-01-03 NOTE — Progress Notes (Signed)
Subjective:    Patient ID: [name], female    DOB: 1928-11-20, 82 y.o.   MRN: [id number]  HPI   Here with 2-3 days acute onset fever, facial pain, pressure, headache, general weakness and malaise, and greenish d/c, with mild ST and cough, but pt denies chest pain, wheezing, increased sob or doe, orthopnea, PND, increased LE swelling, palpitations, dizziness or syncope. Also with incidental right > left hearing loss - ? Wax impactions for the past wk.  No ear pain, dizziness, vertigo, tinnitus.  Pt denies chest pain, increased sob or doe, wheezing, orthopnea, PND, increased LE swelling, palpitations, dizziness or syncope.  Pt denies new neurological symptoms such as new headache, or facial or extremity weakness or numbness. Does have several wks ongoing nasal allergy symptoms with clearish congestion, itch and sneezing, without fever, pain, ST, cough, swelling or wheezing., flonase not really helping as well recently.  Past Medical History  Diagnosis Date  . Hyponatremia   . Hypothyroidism   . Diabetes mellitus     type wears insulin pump  . GERD (gastroesophageal reflux disease)   . Arthritis     fingers  . Hypothyroid   . Hypercholesteremia   . Hypertension   . Diabetes mellitus type 1 (HCC)     type I- wears insulin pump  . Osteoarthritis   . PAF (paroxysmal atrial fibrillation) ([place])     One episode in 2008, spontaneously converted in the ED, no further treatment  . Thyroid nodule dx 07/2011    [place] q 34moto follow calcified L thyroid nodule (12/08/11 [place]  . Osteoporosis, post-menopausal     DEXA 12/02/11: -3.5 (with lomax gyn), declines bisphos due to bone pain  . Spondylosis     thoracic spine  . [facility]    ER +  PR +  Her 2 -  Ki67 9%   0.9 cm invasive lobular  Carcinoma ,s/p central lumpectomy with sentinel node biopsy,  ER/PR positive s/p re-excion on 09/16/11 with final pathology showing atypical hyperplasia    Past  Surgical History  Procedure Laterality Date  . Cardiovascular stress test  03/17/2012  . Breast surgery      biopsy  . Breast lumpectomy    . Appendectomy    . Abdominal hysterectomy    . Bladder repair  5 yrs ago  . Tonsillectomy   age 82 . Appendectomy  age 82 . Abdominal hysterectomy  yrs ago  . Breast surgery   yrs ago    br bx  . Breast surgery  01/ 23/2013    left central lumpectomy and snbx, re-excision lumpectomy  . Left heart catheterization with coronary angiogram N/A 03/18/2012    Procedure: LEFT HEART CATHETERIZATION WITH CORONARY ANGIOGRAM;  Surgeon: JMinus Breeding MD;  [facility];  Service: Cardiovascular;  Laterality: N/A;    reports that she quit smoking about 38 years ago. Her smoking use included Cigarettes. She has a 10 pack-year smoking history. She has never used smokeless tobacco. She reports that she drinks alcohol. She reports that she does not use illicit drugs. family history includes Arthritis in her father; Cancer in her sister; Diabetes in her son; Heart disease in her father; Hypertension in her mother; Stroke in her mother. Allergies  Allergen Reactions  . Augmentin [Amoxicillin-Pot Clavulanate] Other (See Comments)    Gi upset only no rash or hives   . Keflex [Cephalexin] Hives and Nausea Only  Stomach upset  . Prednisone     Other reaction(s): Other Makes blood sugars run high  . Hydrocodone Rash    Rash to chest , side back  . Latex Rash    Powder in gloves causes rash; "not allergic to latex just the powder"   Current Outpatient Prescriptions on File Prior to Visit  Medication Sig Dispense Refill  . ALPRAZolam (XANAX) 0.5 MG tablet Take 0.5 tablets (0.25 mg total) by mouth 2 (two) times daily as needed for anxiety or sleep. 30 tablet 5  . aspirin 81 MG tablet Take 81 mg by mouth daily.    . Calcium Citrate-Vitamin D (CITRACAL + D PO) Take 1,000 mg by mouth daily.    . cholecalciferol (VITAMIN D) 1000 UNITS tablet Take 1,000  Units by mouth 2 (two) times daily.     . fish oil-omega-3 fatty acids 1000 MG capsule Take 2 g by mouth daily.    [name] glucose blood (ONE TOUCH ULTRA TEST) test strip Used to test blood sugars 8 times a day    . hydrochlorothiazide (MICROZIDE) 12.5 MG capsule Take 12.5 mg by mouth daily as needed (swelling).    . insulin aspart (NOVOLOG FLEXPEN) 100 UNIT/ML FlexPen 1 unit for every 6 grams/CHO  Breakfast, 1 unit for every 10 grams/CHO lunch, 1 unit for every 8 grams/CHO supperCorrection glucose-120/60 at meals    . Insulin Glargine (LANTUS SOLOSTAR) 100 UNIT/ML Solostar Pen Inject 16 Units into the skin daily at 10 pm.     . levothyroxine (SYNTHROID, LEVOTHROID) 112 MCG tablet Take 1 tablet (112 mcg total) by mouth daily. Must establish with NEW PCP for additional refills. 90 tablet 1  . Loperamide HCl (IMODIUM PO) Take 1 tablet by mouth daily as needed (diarrhea).     . losartan (COZAAR) 100 MG tablet Take 1 tablet (100 mg total) by mouth daily. 90 tablet 3  . metoprolol (LOPRESSOR) 50 MG tablet Take 1/2 tab as needed for rapid palpitations.  May repeat in 1 hour if palpitations continue. 30 tablet 2  . Multiple Vitamin (MULTIVITAMIN) tablet Take 1 tablet by mouth daily. For breast and bone health    . simvastatin (ZOCOR) 20 MG tablet Take 20 mg by mouth at bedtime.     . vitamin C (ASCORBIC ACID) 500 MG tablet Take 1,000 mg by mouth daily.    . Wheat Dextrin (BENEFIBER PO) Take by mouth. 2 tablespoons before breakfast.     No current facility-administered medications on file prior to visit.   Review of Systems  Constitutional: Negative for unusual diaphoresis or night sweats HENT: Negative for ear swelling or discharge Eyes: Negative for worsening visual haziness  Respiratory: Negative for choking and stridor.   Gastrointestinal: Negative for distension or worsening eructation Genitourinary: Negative for retention or change in urine volume.  Musculoskeletal: Negative for other MSK pain or  swelling Skin: Negative for color change and worsening wound Neurological: Negative for tremors and numbness other than noted  Psychiatric/Behavioral: Negative for decreased concentration or agitation other than above       Objective:   Physical Exam BP 130/70 mmHg  Pulse 89  Temp(Src) 98.9 F (37.2 C) (Oral)  Resp 20  Wt 145 lb (65.772 kg)  SpO2 92% VS noted, mild ill Constitutional: Pt appears in no apparent distress HENT: Head: NCAT.  Right Ear: External ear normal. Right canal irrigated to some degree but appears to be residual impacted was over the right TM that is not able to be removed  Left Ear: External ear normal. left canal irrigated of wax, without residual erythema, swelling or wax Bilat tm's with mild erythema.  Max sinus areas mild tender.  Pharynx with mild erythema, no exudate Eyes: . Pupils are equal, round, and reactive to light. Conjunctivae and EOM are normal Neck: Normal range of motion. Neck supple.  Cardiovascular: Normal rate and regular rhythm.   Pulmonary/Chest: Effort normal and breath sounds without rales or wheezing.  Abd:  Soft, NT, ND, + BS Neurological: Pt is alert. Not confused , motor grossly intact Skin: Skin is warm. No rash, no LE edema Psychiatric: Pt behavior is normal. No agitation.     Assessment & Plan:

## 2016-01-03 NOTE — Patient Instructions (Addendum)
Please take all new medication as prescribed - the antibiotic  OK to change the Flonase to OTC Nasacort  Please continue all other medications as before, and refills have been done if requested.  Please have the pharmacy call with any other refills you may need.  Please keep your appointments with your specialists as you may have planned  You will be contacted regarding the referral for: ENT in [place] for the hearing loss

## 2016-01-03 NOTE — Assessment & Plan Note (Signed)
Mild to mod prob seasonal flare, ok to change the flonase otc to nasacort,  to f/u any worsening symptoms or concerns

## 2016-01-03 NOTE — Assessment & Plan Note (Signed)
Mild to mod, for antibx course,  to f/u any worsening symptoms or concerns 

## 2016-01-03 NOTE — Progress Notes (Signed)
Pre visit review using our clinic review tool, if applicable. No additional management support is needed unless otherwise documented below in the visit note. 

## 2016-01-03 NOTE — Assessment & Plan Note (Signed)
stable overall by history and exam, recent data reviewed with pt, and pt to continue medical treatment as before,  to f/u any worsening symptoms or concerns BP Readings from Last 3 Encounters:  01/03/16 130/70  10/22/15 150/76  09/13/15 164/72

## 2016-01-03 NOTE — Assessment & Plan Note (Signed)
With residual right wax impaction unable to be irrigated, ok for ENT referral ,  to f/u any worsening symptoms or concerns

## 2016-02-11 ENCOUNTER — Other Ambulatory Visit: Payer: Self-pay | Admitting: Internal Medicine

## 2016-03-10 ENCOUNTER — Other Ambulatory Visit (INDEPENDENT_AMBULATORY_CARE_PROVIDER_SITE_OTHER): Payer: Medicare Other

## 2016-03-10 ENCOUNTER — Encounter: Payer: Self-pay | Admitting: Internal Medicine

## 2016-03-10 ENCOUNTER — Ambulatory Visit (INDEPENDENT_AMBULATORY_CARE_PROVIDER_SITE_OTHER): Payer: Medicare Other | Admitting: Internal Medicine

## 2016-03-10 VITALS — BP 154/84 | HR 78 | Temp 98.1°F | Resp 16 | Wt 143.0 lb

## 2016-03-10 DIAGNOSIS — R109 Unspecified abdominal pain: Secondary | ICD-10-CM | POA: Diagnosis not present

## 2016-03-10 DIAGNOSIS — E039 Hypothyroidism, unspecified: Secondary | ICD-10-CM

## 2016-03-10 DIAGNOSIS — I10 Essential (primary) hypertension: Secondary | ICD-10-CM | POA: Diagnosis not present

## 2016-03-10 DIAGNOSIS — R197 Diarrhea, unspecified: Secondary | ICD-10-CM | POA: Diagnosis not present

## 2016-03-10 DIAGNOSIS — E1043 Type 1 diabetes mellitus with diabetic autonomic (poly)neuropathy: Secondary | ICD-10-CM | POA: Diagnosis not present

## 2016-03-10 DIAGNOSIS — E7800 Pure hypercholesterolemia, unspecified: Secondary | ICD-10-CM

## 2016-03-10 LAB — CBC WITH DIFFERENTIAL/PLATELET
Basophils Absolute: 0 10*3/uL (ref 0.0–0.1)
Basophils Relative: 0.3 % (ref 0.0–3.0)
EOS PCT: 1.7 % (ref 0.0–5.0)
Eosinophils Absolute: 0.1 10*3/uL (ref 0.0–0.7)
HCT: 39.1 % (ref 36.0–46.0)
HEMOGLOBIN: 13.1 g/dL (ref 12.0–15.0)
Lymphocytes Relative: 32.4 % (ref 12.0–46.0)
Lymphs Abs: 2.3 10*3/uL (ref 0.7–4.0)
MCHC: 33.6 g/dL (ref 30.0–36.0)
MCV: 91.2 fl (ref 78.0–100.0)
MONO ABS: 0.7 10*3/uL (ref 0.1–1.0)
MONOS PCT: 10.6 % (ref 3.0–12.0)
Neutro Abs: 3.9 10*3/uL (ref 1.4–7.7)
Neutrophils Relative %: 55 % (ref 43.0–77.0)
Platelets: 222 10*3/uL (ref 150.0–400.0)
RBC: 4.29 Mil/uL (ref 3.87–5.11)
RDW: 12.4 % (ref 11.5–15.5)
WBC: 7 10*3/uL (ref 4.0–10.5)

## 2016-03-10 LAB — COMPREHENSIVE METABOLIC PANEL
ALBUMIN: 4.5 g/dL (ref 3.5–5.2)
ALK PHOS: 79 U/L (ref 39–117)
ALT: 15 U/L (ref 0–35)
AST: 27 U/L (ref 0–37)
BUN: 8 mg/dL (ref 6–23)
CO2: 27 mEq/L (ref 19–32)
Calcium: 10.4 mg/dL (ref 8.4–10.5)
Chloride: 104 mEq/L (ref 96–112)
Creatinine, Ser: 0.67 mg/dL (ref 0.40–1.20)
GFR: 88.96 mL/min (ref >60.00)
Glucose, Bld: 58 mg/dL — ABNORMAL LOW (ref 70–99)
POTASSIUM: 4.5 meq/L (ref 3.5–5.1)
Sodium: 138 mEq/L (ref 135–145)
TOTAL PROTEIN: 8.1 g/dL (ref 6.0–8.3)
Total Bilirubin: 0.5 mg/dL (ref 0.2–1.2)

## 2016-03-10 LAB — LIPASE: Lipase: 6 U/L — ABNORMAL LOW (ref 11.0–59.0)

## 2016-03-10 LAB — URINALYSIS, ROUTINE W REFLEX MICROSCOPIC
BILIRUBIN URINE: NEGATIVE
Hgb urine dipstick: NEGATIVE
KETONES UR: NEGATIVE
LEUKOCYTES UA: NEGATIVE
Nitrite: NEGATIVE
PH: 6.5 (ref 5.0–8.0)
RBC / HPF: NONE SEEN (ref 0–?)
Specific Gravity, Urine: <=1.005 — AB (ref 1.000–1.030)
Total Protein, Urine: NEGATIVE
UROBILINOGEN UA: 0.2 (ref 0.0–1.0)
Urine Glucose: NEGATIVE
WBC, UA: NONE SEEN (ref 0–?)

## 2016-03-10 LAB — AMYLASE: AMYLASE: 38 U/L (ref 27–131)

## 2016-03-10 MED ORDER — ALPRAZOLAM 0.5 MG PO TABS: 0.2500 mg | ORAL_TABLET | Freq: Two times a day (BID) | ORAL | 5 refills | Status: DC | PRN

## 2016-03-10 NOTE — Progress Notes (Signed)
Pre visit review using our clinic review tool, if applicable. No additional management support is needed unless otherwise documented below in the visit note. 

## 2016-03-10 NOTE — Patient Instructions (Addendum)
  Test(s) ordered today. Your results will be released to [place] (or called to you) after review, usually within 72hours after test completion. If any changes need to be made, you will be notified at that same time.   Medications reviewed and updated.  No changes recommended at this time.  Your prescription(s) have been submitted to your pharmacy. Please take as directed and contact our office if you believe you are having problem(s) with the medication(s).  A ct scan of your abdomen was ordered - you will be called to schedule this.   Please followup in 6 months

## 2016-03-10 NOTE — Assessment & Plan Note (Signed)
Taking simvastatin daily Continue same

## 2016-03-10 NOTE — Assessment & Plan Note (Signed)
Management per endocrine 

## 2016-03-10 NOTE — Assessment & Plan Note (Signed)
Cramping pain across lower abdomen, stabbing pain left upper quadrant Associated with diarrhea, which is acute on chronic Check CT of the abdomen and pelvis given history of mesenteric ischemia, also rule out diverticulitis Blood work, urine and stool cultures

## 2016-03-10 NOTE — Assessment & Plan Note (Signed)
Well controlled at home, elevated here Continue to monitor at home continue current medications

## 2016-03-10 NOTE — Assessment & Plan Note (Signed)
Following with endocrine Management per endocrine

## 2016-03-10 NOTE — Progress Notes (Signed)
Subjective:    Patient ID: [name], female    DOB: 1929-06-18, 82 y.o.   MRN: [id number]  HPI She is here for follow up.  Hypertension: She is taking her medication daily. She is compliant with a low sodium diet.  She is not exercising regularly.  She does monitor her blood pressure at home  - well controlled.    Hypothyroidism:  She is taking her medication daily.  She denies any recent changes in energy or weight that are unexplained.  She follows with endocrine.   Hyperlipidemia: She is taking her medication daily. She is compliant with a low fat/cholesterol diet. She is not exercising regularly. She denies myalgias.   Diabetes: She is taking her medication daily as prescribed. She follows with endocrine.  She is compliant with a diabetic diet. She is not exercising regularly.   Diarrhea, abdominal pain:  She has had diarrhea since she had surgery on her bowels, but the past three weeks has been worse.  Yesterday she had three episodes of diarrhea.  She has taken imodium as needed.  She has abdominal cramping and bloating.  Before three weeks ago she would have diarrhea about every other day.  She denies blood in the stool.  She denies fever.  She denies travel and recent antibiotics.   Medications and allergies reviewed with patient and updated if appropriate.  Patient Active Problem List   Diagnosis Date Noted  . Hearing loss 01/03/2016  . Acute upper respiratory infection 01/03/2016  . Allergic rhinitis 01/03/2016  . Minimal Coronary Plaque by Cardiac Cath in 2013 09/11/2015  . Palpitations 09/06/2015  . Frequent loose stools 08/28/2015  . Hyponatremia   . Acute mesenteric ischemia ([place])   . Hypothyroidism 07/11/2012  . Precordial pain 03/18/2012  . Syncope 03/18/2012  . Abnormal LFTs   . Hypercholesteremia   . Hypertension   . Diabetes mellitus type 1 ([place])   . Osteoarthritis   . Thyroid nodule   . Osteoporosis, post-menopausal   . Primary cancer of upper  outer quadrant of left female breast ([place]) 08/21/2011    Current Outpatient Prescriptions on File Prior to Visit  Medication Sig Dispense Refill  . ALPRAZolam (XANAX) 0.5 MG tablet Take 0.5 tablets (0.25 mg total) by mouth 2 (two) times daily as needed for anxiety or sleep. 30 tablet 5  . aspirin 81 MG tablet Take 81 mg by mouth daily.    . Calcium Citrate-Vitamin D (CITRACAL + D PO) Take 1,000 mg by mouth daily.    . cholecalciferol (VITAMIN D) 1000 UNITS tablet Take 1,000 Units by mouth 2 (two) times daily.     . fish oil-omega-3 fatty acids 1000 MG capsule Take 2 g by mouth daily.    [name] glucose blood (ONE TOUCH ULTRA TEST) test strip Used to test blood sugars 8 times a day    . hydrochlorothiazide (MICROZIDE) 12.5 MG capsule Take 12.5 mg by mouth daily as needed (swelling).    . Insulin Glargine (LANTUS SOLOSTAR) 100 UNIT/ML Solostar Pen Inject 16 Units into the skin daily at 10 pm.     . levothyroxine (SYNTHROID, LEVOTHROID) 112 MCG tablet Take 1 tablet (112 mcg total) by mouth daily before breakfast. --- Must have labs for further refills. 90 tablet 0  . Loperamide HCl (IMODIUM PO) Take 1 tablet by mouth daily as needed (diarrhea).     . losartan (COZAAR) 100 MG tablet Take 1 tablet (100 mg total) by mouth daily. 90 tablet 3  .  metoprolol (LOPRESSOR) 50 MG tablet Take 1/2 tab as needed for rapid palpitations.  May repeat in 1 hour if palpitations continue. 30 tablet 2  . Multiple Vitamin (MULTIVITAMIN) tablet Take 1 tablet by mouth daily. For breast and bone health    . simvastatin (ZOCOR) 20 MG tablet Take 20 mg by mouth at bedtime.     . vitamin C (ASCORBIC ACID) 500 MG tablet Take 1,000 mg by mouth daily.    . Wheat Dextrin (BENEFIBER PO) Take by mouth. 2 tablespoons before breakfast.     No current facility-administered medications on file prior to visit.     Past Medical History:  Diagnosis Date  . Arthritis    fingers  . Cancer of upper-outer quadrant of female breast ([place])  08/21/2011   ER +  PR +  Her 2 -  Ki67 9%   0.9 cm invasive lobular  Carcinoma ,s/p central lumpectomy with sentinel node biopsy,  ER/PR positive s/p re-excion on 09/16/11 with final pathology showing atypical hyperplasia   . Diabetes mellitus    type wears insulin pump  . Diabetes mellitus type 1 ([facility])    type I- wears insulin pump  . GERD (gastroesophageal reflux disease)   . Hypercholesteremia   . Hypertension   . Hyponatremia   . Hypothyroid   . Hypothyroidism   . Osteoarthritis   . Osteoporosis, post-menopausal    DEXA 12/02/11: -3.5 (with lomax gyn), declines bisphos due to bone pain  . PAF (paroxysmal atrial fibrillation) ([place])    One episode in 2008, spontaneously converted in the ED, no further treatment  . Spondylosis    thoracic spine  . Thyroid nodule dx 07/2011   [place] q 50moto follow calcified L thyroid nodule (12/08/11 [place]    Past Surgical History:  Procedure Laterality Date  . ABDOMINAL HYSTERECTOMY    . ABDOMINAL HYSTERECTOMY  yrs ago  . APPENDECTOMY    . APPENDECTOMY  age 82 . BLADDER REPAIR  5 yrs ago  . BREAST LUMPECTOMY    . BREAST SURGERY     biopsy  . BREAST SURGERY   yrs ago   br bx  . BREAST SURGERY  01/ 23/2013   left central lumpectomy and snbx, re-excision lumpectomy  . CARDIOVASCULAR STRESS TEST  03/17/2012  . LEFT HEART CATHETERIZATION WITH CORONARY ANGIOGRAM N/A 03/18/2012   Procedure: LEFT HEART CATHETERIZATION WITH CORONARY ANGIOGRAM;  Surgeon: JMinus Breeding MD;  [facility];  Service: Cardiovascular;  Laterality: N/A;  . TONSILLECTOMY   age 82   Social History   Social History  . Marital status: Married    Spouse name: N/A  . Number of children: N/A  . Years of education: N/A   Social History Main Topics  . Smoking status: Former Smoker    Packs/day: 1.00    Years: 10.00    Types: Cigarettes    Quit date: 08/27/1977  . Smokeless tobacco: Never Used  . Alcohol use Yes     Comment: rare  . Drug use: No  . Sexual activity:  Yes    Birth control/ protection: Post-menopausal   Other Topics Concern  . None   Social History Narrative   ** Merged History Encounter **        Family History  Problem Relation Age of Onset  . Cancer Sister     unknown  . Hypertension Mother   . Stroke Mother   . Arthritis Father   . Heart disease Father   .  Diabetes Son     Review of Systems  Constitutional: Positive for appetite change (decreased). Negative for fever.  Respiratory: Positive for cough and shortness of breath (mild with exertion). Negative for wheezing.   Cardiovascular: Positive for palpitations (a little bit, transient) and leg swelling (ankle swelling). Negative for chest pain.  Gastrointestinal: Positive for abdominal distention, abdominal pain (abd craming and sharp pain in left mid quadrant), diarrhea and nausea (with diarrhea). Negative for blood in stool.  Neurological: Positive for light-headedness and headaches.       Objective:   Vitals:   03/10/16 1449  BP: (!) 154/84  Pulse: 78  Resp: 16  Temp: 98.1 F (36.7 C)   Filed Weights   03/10/16 1449  Weight: 143 lb (64.9 kg)   Body mass index is 22.4 kg/m.   Physical Exam  Constitutional: She appears well-developed and well-nourished. No distress.  HENT:  Head: Normocephalic and atraumatic.  Eyes: Conjunctivae are normal.  Neck: Neck supple. No tracheal deviation present. No thyromegaly present.  Cardiovascular: Normal rate, regular rhythm and normal heart sounds.   No murmur heard. Pulmonary/Chest: Effort normal and breath sounds normal. No respiratory distress. She has no wheezes. She has no rales.  Abdominal: Soft. She exhibits distension. She exhibits no mass. There is tenderness (LUQ tenderness and tenderness across lower abdomen). There is no rebound and no guarding.  Musculoskeletal: She exhibits no edema.  Lymphadenopathy:    She has no cervical adenopathy.  Skin: Skin is warm and dry. She is not diaphoretic.           Assessment & Plan:   See Problem List for Assessment and Plan of chronic medical problems.

## 2016-03-10 NOTE — Assessment & Plan Note (Signed)
Acute on chronic Worse for the past 3 weeks Associated with abdominal cramping, bloating Using Imodium as needed Possible diverticulitis, history of mesenteric ischemia CT scan of the abdomen and pelvis Blood work, stool cultures, urinalysis, urine culture

## 2016-03-11 ENCOUNTER — Telehealth: Payer: Self-pay | Admitting: Internal Medicine

## 2016-03-11 ENCOUNTER — Ambulatory Visit (INDEPENDENT_AMBULATORY_CARE_PROVIDER_SITE_OTHER)
Admission: RE | Admit: 2016-03-11 | Discharge: 2016-03-11 | Disposition: A | Discharge status: Home / Self Care | Payer: Medicare Other | Source: Ambulatory Visit | Attending: Internal Medicine | Admitting: Internal Medicine

## 2016-03-11 DIAGNOSIS — R109 Unspecified abdominal pain: Secondary | ICD-10-CM

## 2016-03-11 DIAGNOSIS — R197 Diarrhea, unspecified: Secondary | ICD-10-CM | POA: Diagnosis not present

## 2016-03-11 LAB — URINE CULTURE

## 2016-03-11 MED ORDER — IOPAMIDOL (ISOVUE-300) INJECTION 61%
100.0000 mL | Freq: Once | INTRAVENOUS | Status: AC | PRN
  Administered 2016-03-11: 100 mL via INTRAVENOUS

## 2016-03-11 NOTE — Telephone Encounter (Signed)
Stacy from [facility] states results from pts CT scan are available

## 2016-03-12 ENCOUNTER — Other Ambulatory Visit: Payer: Medicare Other

## 2016-03-12 ENCOUNTER — Other Ambulatory Visit: Payer: Self-pay | Admitting: Internal Medicine

## 2016-03-12 DIAGNOSIS — R197 Diarrhea, unspecified: Secondary | ICD-10-CM

## 2016-03-12 DIAGNOSIS — R109 Unspecified abdominal pain: Secondary | ICD-10-CM

## 2016-03-13 LAB — C. DIFFICILE GDH AND TOXIN A/B
C. difficile GDH: NOT DETECTED
C. difficile Toxin A/B: NOT DETECTED

## 2016-03-16 LAB — STOOL CULTURE

## 2016-03-25 LAB — GIARDIA/CRYPTOSPORIDIUM (EIA)

## 2016-03-27 ENCOUNTER — Other Ambulatory Visit: Payer: Medicare Other

## 2016-03-31 ENCOUNTER — Other Ambulatory Visit: Payer: Medicare Other

## 2016-04-01 LAB — C. DIFFICILE GDH AND TOXIN A/B
C. DIFF TOXIN A/B: NOT DETECTED
C. difficile GDH: NOT DETECTED

## 2016-04-03 LAB — GIARDIA/CRYPTOSPORIDIUM (EIA)

## 2016-04-14 ENCOUNTER — Telehealth: Payer: Self-pay | Admitting: Internal Medicine

## 2016-04-14 NOTE — Telephone Encounter (Signed)
New Message  Request for surgical clearance:  1. What type of surgery is being performed? Cataract  2. When is this surgery scheduled? 9.14.17  3. Are there any medications that need to be held prior to surgery and how long? No  4. Name of physician performing surgery? MD Julian Reil   5. What is your office phone and fax number? [phone]   [phone]

## 2016-04-15 NOTE — Telephone Encounter (Signed)
Follow up        Pt is calling to see if she needs to have a surgical clearance check up.  She is afraid of being put to sleep.  She is afraid that her heart is too weak.  Have we received the surgical clearance?  Will she need an appt?  Please call

## 2016-04-15 NOTE — Telephone Encounter (Signed)
Spoke to patient and let her know that I did not feel she will need to be seen prior to surgery.  I will have Dr Lovena Le sign tomorrow and send in

## 2016-04-16 NOTE — Telephone Encounter (Signed)
Discussed with Dr Lovena Le, okay to proceed with surgery.

## 2016-04-22 ENCOUNTER — Telehealth: Payer: Self-pay | Admitting: Internal Medicine

## 2016-04-22 NOTE — Telephone Encounter (Signed)
Clearance faxed to [facility] at [phone].

## 2016-04-22 NOTE — Telephone Encounter (Signed)
New message    Freda Munro called stating she never received the medical clearance for the pt. Please refax to [phone].

## 2016-05-04 ENCOUNTER — Encounter: Payer: Self-pay | Admitting: Family Medicine

## 2016-05-04 ENCOUNTER — Ambulatory Visit (INDEPENDENT_AMBULATORY_CARE_PROVIDER_SITE_OTHER): Payer: Medicare Other | Admitting: Family Medicine

## 2016-05-04 VITALS — BP 160/70 | HR 78 | Temp 98.3°F | Ht 67.0 in | Wt 141.4 lb

## 2016-05-04 DIAGNOSIS — R05 Cough: Secondary | ICD-10-CM

## 2016-05-04 DIAGNOSIS — J069 Acute upper respiratory infection, unspecified: Secondary | ICD-10-CM | POA: Diagnosis not present

## 2016-05-04 DIAGNOSIS — R059 Cough, unspecified: Secondary | ICD-10-CM

## 2016-05-04 MED ORDER — BENZONATATE 100 MG PO CAPS: 100.0000 mg | ORAL_CAPSULE | Freq: Three times a day (TID) | ORAL | 0 refills | Status: DC

## 2016-05-04 NOTE — Patient Instructions (Signed)
Please continue taking Mucinex and Zyrtec as needed.  Your symptoms are most likely related to a viral illness. Please drink plenty of water so that your urine is pale yellow or clear. Also, get plenty of rest, use tylenol as needed for discomfort and follow up if symptoms do not improve in 3 to 4 days, worsen, or you develop a fever >101. It was a pleasure seeing you today! Hope you feel better soon!   Upper Respiratory Infection, Adult Most upper respiratory infections (URIs) are a viral infection of the air passages leading to the lungs. A URI affects the nose, throat, and upper air passages. The most common type of URI is nasopharyngitis and is typically referred to as "the common cold." URIs run their course and usually go away on their own. Most of the time, a URI does not require medical attention, but sometimes a bacterial infection in the upper airways can follow a viral infection. This is called a secondary infection. Sinus and middle ear infections are common types of secondary upper respiratory infections. Bacterial pneumonia can also complicate a URI. A URI can worsen asthma and chronic obstructive pulmonary disease (COPD). Sometimes, these complications can require emergency medical care and may be life threatening.  CAUSES Almost all URIs are caused by viruses. A virus is a type of germ and can spread from one person to another.  RISKS FACTORS You may be at risk for a URI if:   You smoke.   You have chronic heart or lung disease.  You have a weakened defense (immune) system.   You are very young or very old.   You have nasal allergies or asthma.  You work in crowded or poorly ventilated areas.  You work in health care facilities or schools. SIGNS AND SYMPTOMS  Symptoms typically develop 2-3 days after you come in contact with a cold virus. Most viral URIs last 7-10 days. However, viral URIs from the influenza virus (flu virus) can last 14-18 days and are typically more  severe. Symptoms may include:   Runny or stuffy (congested) nose.   Sneezing.   Cough.   Sore throat.   Headache.   Fatigue.   Fever.   Loss of appetite.   Pain in your forehead, behind your eyes, and over your cheekbones (sinus pain).  Muscle aches.  DIAGNOSIS  Your health care provider may diagnose a URI by:  Physical exam.  Tests to check that your symptoms are not due to another condition such as:  Strep throat.  Sinusitis.  Pneumonia.  Asthma. TREATMENT  A URI goes away on its own with time. It cannot be cured with medicines, but medicines may be prescribed or recommended to relieve symptoms. Medicines may help:  Reduce your fever.  Reduce your cough.  Relieve nasal congestion. HOME CARE INSTRUCTIONS   Take medicines only as directed by your health care provider.   Gargle warm saltwater or take cough drops to comfort your throat as directed by your health care provider.  Use a warm mist humidifier or inhale steam from a shower to increase air moisture. This may make it easier to breathe.  Drink enough fluid to keep your urine clear or pale yellow.   Eat soups and other clear broths and maintain good nutrition.   Rest as needed.   Return to work when your temperature has returned to normal or as your health care provider advises. You may need to stay home longer to avoid infecting others. You can also  use a face mask and careful hand washing to prevent spread of the virus.  Increase the usage of your inhaler if you have asthma.   Do not use any tobacco products, including cigarettes, chewing tobacco, or electronic cigarettes. If you need help quitting, ask your health care provider. PREVENTION  The best way to protect yourself from getting a cold is to practice good hygiene.   Avoid oral or hand contact with people with cold symptoms.   Wash your hands often if contact occurs.  There is no clear evidence that vitamin C, vitamin  E, echinacea, or exercise reduces the chance of developing a cold. However, it is always recommended to get plenty of rest, exercise, and practice good nutrition.  SEEK MEDICAL CARE IF:   You are getting worse rather than better.   Your symptoms are not controlled by medicine.   You have chills.  You have worsening shortness of breath.  You have brown or red mucus.  You have yellow or brown nasal discharge.  You have pain in your face, especially when you bend forward.  You have a fever.  You have swollen neck glands.  You have pain while swallowing.  You have white areas in the back of your throat. SEEK IMMEDIATE MEDICAL CARE IF:   You have severe or persistent:  Headache.  Ear pain.  Sinus pain.  Chest pain.  You have chronic lung disease and any of the following:  Wheezing.  Prolonged cough.  Coughing up blood.  A change in your usual mucus.  You have a stiff neck.  You have changes in your:  Vision.  Hearing.  Thinking.  Mood. MAKE SURE YOU:   Understand these instructions.  Will watch your condition.  Will get help right away if you are not doing well or get worse.   This information is not intended to replace advice given to you by your health care provider. Make sure you discuss any questions you have with your health care provider.   Document Released: 01/20/2001 Document Revised: 12/11/2014 Document Reviewed: 11/01/2013 Elsevier Interactive Patient Education [employer].

## 2016-05-04 NOTE — Progress Notes (Signed)
Subjective:    Patient ID: [name], female    DOB: Sep 13, 1928, 82 y.o.   MRN: [id number]  HPI  [name] is an 82 year old female who presents today with congestion and cough for 3 days. Associated symptoms of left ear pain, rhinitis with yellow drainage, post nasal drip, sore throat, nausea, and sinus pressure/pain. Denies fever, chills, sweats, vomiting, and myalgias.  She states that she will obtain a flu vaccine next week. Treatment at home with mucinex and throat lozenges that have provided limited benefit.  She denies chest pain, palpitations, orthopnea,numbness, weakness, dizziness or syncope.   Recent cataract surgery on her left eye. She reports that she was having rhinitis present prior to this surgery 4 days ago. She is a former smoker but quit in 1979. She denies history of asthma/bronchitis. Has a history of allergic rhinitis. She denies recent antibiotic use or contact with sick exposures.   Review of Systems  Constitutional: Negative for chills and fever.  HENT: Positive for congestion, postnasal drip, rhinorrhea and sinus pressure.   Respiratory: Positive for cough. Negative for shortness of breath and wheezing.   Cardiovascular: Negative for chest pain and palpitations.  Gastrointestinal: Positive for nausea. Negative for abdominal pain, diarrhea and vomiting.       Patient has loose stools on a regular basis and is followed by her PCP for this symptoms. She denies any changes in her bowels or increase in frequency of BMs with onset of these symptoms  Musculoskeletal: Negative for myalgias.  Skin: Negative for rash.  Neurological: Negative for dizziness, light-headedness, numbness and headaches.   Past Medical History:  Diagnosis Date  . Arthritis    fingers  . Cancer of upper-outer quadrant of female breast ([place]) 08/21/2011   ER +  PR +  Her 2 -  Ki67 9%   0.9 cm invasive lobular  Carcinoma ,s/p central lumpectomy with sentinel node biopsy,  ER/PR positive  s/p re-excion on 09/16/11 with final pathology showing atypical hyperplasia   . Diabetes mellitus    type wears insulin pump  . Diabetes mellitus type 1 ([facility])    type I- wears insulin pump  . GERD (gastroesophageal reflux disease)   . Hypercholesteremia   . Hypertension   . Hyponatremia   . Hypothyroid   . Hypothyroidism   . Osteoarthritis   . Osteoporosis, post-menopausal    DEXA 12/02/11: -3.5 (with lomax gyn), declines bisphos due to bone pain  . PAF (paroxysmal atrial fibrillation) ([place])    One episode in 2008, spontaneously converted in the ED, no further treatment  . Spondylosis    thoracic spine  . Thyroid nodule dx 07/2011   [place] q 17moto follow calcified L thyroid nodule (12/08/11 [place]     Social History   Social History  . Marital status: Married    Spouse name: [name]  . Number of children: [name]  . Years of education: [name]   Occupational History  . Not on file.   Social History Main Topics  . Smoking status: Former Smoker    Packs/day: 1.00    Years: 10.00    Types: Cigarettes    Quit date: 08/27/1977  . Smokeless tobacco: Never Used  . Alcohol use Yes     Comment: rare  . Drug use: No  . Sexual activity: Yes    Birth control/ protection: Post-menopausal   Other Topics Concern  . Not on file   Social History Narrative   ** Merged History  Encounter **        Past Surgical History:  Procedure Laterality Date  . ABDOMINAL HYSTERECTOMY    . ABDOMINAL HYSTERECTOMY  yrs ago  . APPENDECTOMY    . APPENDECTOMY  age 82  . BLADDER REPAIR  5 yrs ago  . BREAST LUMPECTOMY    . BREAST SURGERY     biopsy  . BREAST SURGERY   yrs ago   br bx  . BREAST SURGERY  01/ 23/2013   left central lumpectomy and snbx, re-excision lumpectomy  . CARDIOVASCULAR STRESS TEST  03/17/2012  . LEFT HEART CATHETERIZATION WITH CORONARY ANGIOGRAM [name] 03/18/2012   Procedure: LEFT HEART CATHETERIZATION WITH CORONARY ANGIOGRAM;  Surgeon: Minus Breeding, MD;  [facility];  Service:  Cardiovascular;  Laterality: [name];  . TONSILLECTOMY   age 85    Family History  Problem Relation Age of Onset  . Cancer Sister     unknown  . Hypertension Mother   . Stroke Mother   . Arthritis Father   . Heart disease Father   . Diabetes Son     Allergies  Allergen Reactions  . Augmentin [Amoxicillin-Pot Clavulanate] Other (See Comments)    Gi upset only no rash or hives   . Keflex [Cephalexin] Hives and Nausea Only    Stomach upset  . Prednisone     Other reaction(s): Other Makes blood sugars run high  . Hydrocodone Rash    Rash to chest , side back  . Latex Rash    Powder in gloves causes rash; "not allergic to latex just the powder"    Current Outpatient Prescriptions on File Prior to Visit  Medication Sig Dispense Refill  . ALPRAZolam (XANAX) 0.5 MG tablet Take 0.5 tablets (0.25 mg total) by mouth 2 (two) times daily as needed for anxiety or sleep. 30 tablet 5  . aspirin 81 MG tablet Take 81 mg by mouth daily.    . Calcium Citrate-Vitamin D (CITRACAL + D PO) Take 1,000 mg by mouth daily.    . cholecalciferol (VITAMIN D) 1000 UNITS tablet Take 1,000 Units by mouth 2 (two) times daily.     . fish oil-omega-3 fatty acids 1000 MG capsule Take 2 g by mouth daily.    [name] glucose blood (ONE TOUCH ULTRA TEST) test strip Used to test blood sugars 8 times a day    . hydrochlorothiazide (MICROZIDE) 12.5 MG capsule Take 12.5 mg by mouth daily as needed (swelling).    . Insulin Glargine (LANTUS SOLOSTAR) 100 UNIT/ML Solostar Pen Inject 16 Units into the skin daily at 10 pm.     . insulin lispro (HUMALOG KWIKPEN) 100 UNIT/ML KiwkPen Inject into the skin. Sliding scale before meal    . levothyroxine (SYNTHROID, LEVOTHROID) 112 MCG tablet Take 1 tablet (112 mcg total) by mouth daily before breakfast. --- Must have labs for further refills. 90 tablet 0  . Loperamide HCl (IMODIUM PO) Take 1 tablet by mouth daily as needed (diarrhea).     . losartan (COZAAR) 100 MG tablet Take 1 tablet  (100 mg total) by mouth daily. 90 tablet 3  . metoprolol (LOPRESSOR) 50 MG tablet Take 1/2 tab as needed for rapid palpitations.  May repeat in 1 hour if palpitations continue. 30 tablet 2  . Multiple Vitamin (MULTIVITAMIN) tablet Take 1 tablet by mouth daily. For breast and bone health    . simvastatin (ZOCOR) 20 MG tablet Take 20 mg by mouth at bedtime.     . vitamin C (  ASCORBIC ACID) 500 MG tablet Take 1,000 mg by mouth daily.    . Wheat Dextrin (BENEFIBER PO) Take by mouth. 2 tablespoons before breakfast.     No current facility-administered medications on file prior to visit.     BP (!) 160/70 (BP Location: Right Arm, Patient Position: Sitting, Cuff Size: Normal)   Pulse 78   Temp 98.3 F (36.8 C)   Ht '5\' 7"'$  (1.702 m)   Wt 141 lb 6 oz (64.1 kg)   BMI 22.14 kg/m       Objective:   Physical Exam  Constitutional: She is oriented to person, place, and time.  Thin, optimally nourished female  HENT:  Nose: Rhinorrhea present. Right sinus exhibits maxillary sinus tenderness and frontal sinus tenderness. Left sinus exhibits maxillary sinus tenderness and frontal sinus tenderness.  Mouth/Throat: Mucous membranes are normal. No oropharyngeal exudate or posterior oropharyngeal erythema.  Cerumen noted in Right TM. Left TM dull  Eyes: Pupils are equal, round, and reactive to light. No scleral icterus.  Neck: Neck supple.  Cardiovascular: Normal rate and regular rhythm.   Pulmonary/Chest: Effort normal and breath sounds normal. She has no wheezes. She has no rales.  Abdominal: Soft. Bowel sounds are normal. There is no tenderness.  Lymphadenopathy:    She has cervical adenopathy.  Neurological: She is alert and oriented to person, place, and time.  Skin: Skin is warm and dry.  Psychiatric: She has a normal mood and affect. Her behavior is normal. Judgment and thought content normal.        Assessment & Plan:  1. Acute upper respiratory infection Symptoms of nasal congestion with  sinus pressure and ear pressure are most likely viral in nature. Lungs are CTA. Advised patient regarding the indications for antibiotic therapy including length of symptoms > 10 days, double sickening, and acutely ill presentation. VSS and patient is in agreement with plan that no antibiotic is warranted at this time. She voiced understanding and agrees with plan for home comfort measures and will follow up if symptoms do not improve in 3 to 4 days, worsen, or she develops a fever >101.  2. Cough Mucinex DM or Delsym for cough as needed. Can use benzonatate for cough that is not controlled with OTC measures.  - benzonatate (TESSALON) 100 MG capsule; Take 1 capsule (100 mg total) by mouth 3 (three) times daily.  Dispense: 20 capsule; Refill: 0  Delano Metz, FNP-C

## 2016-05-04 NOTE — Progress Notes (Signed)
Pre visit review using our clinic review tool, if applicable. No additional management support is needed unless otherwise documented below in the visit note. 

## 2016-05-07 ENCOUNTER — Other Ambulatory Visit: Payer: Self-pay | Admitting: Internal Medicine

## 2016-06-15 ENCOUNTER — Ambulatory Visit (INDEPENDENT_AMBULATORY_CARE_PROVIDER_SITE_OTHER): Payer: Medicare Other | Admitting: Internal Medicine

## 2016-06-15 ENCOUNTER — Ambulatory Visit (INDEPENDENT_AMBULATORY_CARE_PROVIDER_SITE_OTHER)
Admission: RE | Admit: 2016-06-15 | Discharge: 2016-06-15 | Disposition: A | Discharge status: Home / Self Care | Payer: Medicare Other | Source: Ambulatory Visit | Attending: Internal Medicine | Admitting: Internal Medicine

## 2016-06-15 VITALS — BP 192/88 | HR 83 | Temp 98.2°F | Resp 16 | Ht 67.0 in | Wt 144.0 lb

## 2016-06-15 DIAGNOSIS — R05 Cough: Secondary | ICD-10-CM

## 2016-06-15 DIAGNOSIS — R197 Diarrhea, unspecified: Secondary | ICD-10-CM | POA: Diagnosis not present

## 2016-06-15 DIAGNOSIS — R0781 Pleurodynia: Secondary | ICD-10-CM | POA: Diagnosis not present

## 2016-06-15 DIAGNOSIS — I10 Essential (primary) hypertension: Secondary | ICD-10-CM

## 2016-06-15 DIAGNOSIS — R059 Cough, unspecified: Secondary | ICD-10-CM

## 2016-06-15 MED ORDER — HYDROCHLOROTHIAZIDE 25 MG PO TABS: 25.0000 mg | ORAL_TABLET | Freq: Every day | ORAL | 3 refills | Status: DC

## 2016-06-15 MED ORDER — OMEPRAZOLE 40 MG PO CPDR: 40.0000 mg | DELAYED_RELEASE_CAPSULE | Freq: Every day | ORAL | 3 refills | Status: DC

## 2016-06-15 NOTE — Progress Notes (Signed)
Subjective:    Patient ID: [name], female    DOB: Nov 19, 1928, 82 y.o.   MRN: [id number]  HPI She is here for an acute visit.   Frequent stools:  She is having 2-3 BM a day.  Her stools are soft - diarrhea.  If she takes imodium it helps temporarily.  She denies blood in the stool.  This has been a persistent problem and has not gotten better.  She has seen GI in th past.   CT of AB/[place] 03/11/2016:  IMPRESSION: No acute process within the abdomen or pelvis. Stool throughout the colon as can be seen with constipation.  LUQ pain/ rib pain;  She continues to have discomfort in her LUQ/ left rib.  She occasional has pain in her left side or back region.  She had the Ct scan above a few months ago and it was negative for any reason for the pain.  She has daily burping and abdominal bloating.  She denies GERD.  She has occasional nausea.    Hypertension: She is taking her medication daily. She only takes hctz 12.5 mg as needed.  She is compliant with a low sodium diet.   '    Medications and allergies reviewed with patient and updated if appropriate.  Patient Active Problem List   Diagnosis Date Noted  . AP (abdominal pain) 03/10/2016  . Diarrhea 03/10/2016  . Hearing loss 01/03/2016  . Allergic rhinitis 01/03/2016  . Minimal Coronary Plaque by Cardiac Cath in 2013 09/11/2015  . Palpitations 09/06/2015  . Frequent loose stools 08/28/2015  . Hyponatremia   . Acute mesenteric ischemia ([place])   . Hypothyroidism 07/11/2012  . Precordial pain 03/18/2012  . Syncope 03/18/2012  . Abnormal LFTs   . Hypercholesteremia   . Hypertension   . Diabetes mellitus type 1 ([place])   . Osteoarthritis   . Thyroid nodule   . Osteoporosis, post-menopausal   . Primary cancer of upper outer quadrant of left female breast ([place]) 08/21/2011    Current Outpatient Prescriptions on File Prior to Visit  Medication Sig Dispense Refill  . ALPRAZolam (XANAX) 0.5 MG tablet Take 0.5 tablets (0.25 mg  total) by mouth 2 (two) times daily as needed for anxiety or sleep. 30 tablet 5  . aspirin 81 MG tablet Take 81 mg by mouth daily.    . benzonatate (TESSALON) 100 MG capsule Take 1 capsule (100 mg total) by mouth 3 (three) times daily. 20 capsule 0  . Calcium Citrate-Vitamin D (CITRACAL + D PO) Take 1,000 mg by mouth daily.    . cholecalciferol (VITAMIN D) 1000 UNITS tablet Take 1,000 Units by mouth 2 (two) times daily.     . fish oil-omega-3 fatty acids 1000 MG capsule Take 2 g by mouth daily.    [name] glucose blood (ONE TOUCH ULTRA TEST) test strip Used to test blood sugars 8 times a day    . hydrochlorothiazide (MICROZIDE) 12.5 MG capsule Take 12.5 mg by mouth daily as needed (swelling).    . hydrochlorothiazide (MICROZIDE) 12.5 MG capsule TAKE ONE CAPSULE BY MOUTH EVERY OTHER DAY ONLY AS NEEDED FOR EDEMA 20 capsule 3  . Insulin Glargine (LANTUS SOLOSTAR) 100 UNIT/ML Solostar Pen Inject 16 Units into the skin daily at 10 pm.     . insulin lispro (HUMALOG KWIKPEN) 100 UNIT/ML KiwkPen Inject into the skin. Sliding scale before meal    . levothyroxine (SYNTHROID, LEVOTHROID) 112 MCG tablet Take 1 tablet (112 mcg total) by mouth  daily before breakfast. --- Must have labs for further refills. 90 tablet 0  . Loperamide HCl (IMODIUM PO) Take 1 tablet by mouth daily as needed (diarrhea).     . losartan (COZAAR) 100 MG tablet Take 1 tablet (100 mg total) by mouth daily. 90 tablet 3  . metoprolol (LOPRESSOR) 50 MG tablet Take 1/2 tab as needed for rapid palpitations.  May repeat in 1 hour if palpitations continue. 30 tablet 2  . Multiple Vitamin (MULTIVITAMIN) tablet Take 1 tablet by mouth daily. For breast and bone health    . simvastatin (ZOCOR) 20 MG tablet Take 20 mg by mouth at bedtime.     . vitamin C (ASCORBIC ACID) 500 MG tablet Take 1,000 mg by mouth daily.    . Wheat Dextrin (BENEFIBER PO) Take by mouth. 2 tablespoons before breakfast.     No current facility-administered medications on file prior  to visit.     Past Medical History:  Diagnosis Date  . Arthritis    fingers  . Cancer of upper-outer quadrant of female breast (HCC) 08/21/2011   ER +  PR +  Her 2 -  Ki67 9%   0.9 cm invasive lobular  Carcinoma ,s/p central lumpectomy with sentinel node biopsy,  ER/PR positive s/p re-excion on 09/16/11 with final pathology showing atypical hyperplasia   . Diabetes mellitus    type wears insulin pump  . Diabetes mellitus type 1 (HCC)    type I- wears insulin pump  . GERD (gastroesophageal reflux disease)   . Hypercholesteremia   . Hypertension   . Hyponatremia   . Hypothyroid   . Hypothyroidism   . Osteoarthritis   . Osteoporosis, post-menopausal    DEXA 12/02/11: -3.5 (with lomax gyn), declines bisphos due to bone pain  . PAF (paroxysmal atrial fibrillation) (HCC)    One episode in 2008, spontaneously converted in the ED, no further treatment  . Spondylosis    thoracic spine  . Thyroid nodule dx 07/2011   [place] q 48mo to follow calcified L thyroid nodule (12/08/11 [place])    Past Surgical History:  Procedure Laterality Date  . ABDOMINAL HYSTERECTOMY    . ABDOMINAL HYSTERECTOMY  yrs ago  . APPENDECTOMY    . APPENDECTOMY  age 23  . BLADDER REPAIR  5 yrs ago  . BREAST LUMPECTOMY    . BREAST SURGERY     biopsy  . BREAST SURGERY   yrs ago   br bx  . BREAST SURGERY  01/ 23/2013   left central lumpectomy and snbx, re-excision lumpectomy  . CARDIOVASCULAR STRESS TEST  03/17/2012  . LEFT HEART CATHETERIZATION WITH CORONARY ANGIOGRAM N/A 03/18/2012   Procedure: LEFT HEART CATHETERIZATION WITH CORONARY ANGIOGRAM;  Surgeon: Rollene Rotunda, MD;  Location: [facility] CATH LAB;  Service: Cardiovascular;  Laterality: N/A;  . TONSILLECTOMY   age 17    Social History   Social History  . Marital status: Married    Spouse name: N/A  . Number of children: N/A  . Years of education: N/A   Social History Main Topics  . Smoking status: Former Smoker    Packs/day: 1.00    Years: 10.00    Types:  Cigarettes    Quit date: 08/27/1977  . Smokeless tobacco: Never Used  . Alcohol use Yes     Comment: rare  . Drug use: No  . Sexual activity: Yes    Birth control/ protection: Post-menopausal   Other Topics Concern  . Not on file   Social  History Narrative   ** Merged History Encounter **        Family History  Problem Relation Age of Onset  . Cancer Sister     unknown  . Hypertension Mother   . Stroke Mother   . Arthritis Father   . Heart disease Father   . Diabetes Son     Review of Systems  Constitutional: Positive for appetite change (decreased). Negative for chills and fever.  Respiratory: Positive for cough and shortness of breath (little). Negative for wheezing.   Cardiovascular: Negative for chest pain, palpitations and leg swelling.  Gastrointestinal: Positive for abdominal distention, abdominal pain (LUQ - daily), diarrhea and nausea. Negative for blood in stool.       Burping daily, no GERD  Neurological: Positive for headaches (sharp pain in head). Negative for light-headedness.       Objective:   Vitals:   06/15/16 1543 06/15/16 1607  BP: (!) 202/84 (!) 192/88  Pulse: 83   Resp: 16   Temp: 98.2 F (36.8 C)    Filed Weights   06/15/16 1543  Weight: 144 lb (65.3 kg)   Body mass index is 22.55 kg/m.   Physical Exam Constitutional: Appears well-developed and well-nourished. No distress.  HENT:  Head: Normocephalic and atraumatic.  Neck: Neck supple. No tracheal deviation present. No thyromegaly present.  No cervical lymphadenopathy Cardiovascular: Normal rate, regular rhythm and normal heart sounds.   No murmur heard. No carotid bruit .  No edema Pulmonary/Chest: left lower rib tender with palpation anteriorly. Effort normal and breath sounds normal. No respiratory distress. No has no wheezes. No rales.  Skin: Skin is warm and dry. Not diaphoretic.  Psychiatric: Normal mood and affect. Behavior is normal.          Assessment & Plan:    See Problem List for Assessment and Plan of chronic medical problems.

## 2016-06-15 NOTE — Progress Notes (Signed)
Pre visit review using our clinic review tool, if applicable. No additional management support is needed unless otherwise documented below in the visit note. 

## 2016-06-15 NOTE — Patient Instructions (Addendum)
  Test(s) ordered today. Your results will be released to [place] (or called to you) after review, usually within 72hours after test completion. If any changes need to be made, you will be notified at that same time.   Medications reviewed and updated.  Changes include starting hctz 25 mg daily.  Start omeprazole 40 mg daily.   Your prescription(s) have been submitted to your pharmacy. Please take as directed and contact our office if you believe you are having problem(s) with the medication(s).   Please followup in 2-3 weeks.

## 2016-06-17 ENCOUNTER — Encounter: Payer: Self-pay | Admitting: Internal Medicine

## 2016-06-17 ENCOUNTER — Telehealth: Payer: Self-pay | Admitting: Internal Medicine

## 2016-06-17 DIAGNOSIS — R0781 Pleurodynia: Secondary | ICD-10-CM | POA: Insufficient documentation

## 2016-06-17 DIAGNOSIS — R05 Cough: Secondary | ICD-10-CM | POA: Insufficient documentation

## 2016-06-17 DIAGNOSIS — R059 Cough, unspecified: Secondary | ICD-10-CM | POA: Insufficient documentation

## 2016-06-17 NOTE — Assessment & Plan Note (Signed)
Start hctz 25 mg daily Continue metoprolol 50 mg daily and losartan 100 mg daily Start checking BP at home F/u in 2-3 weeks

## 2016-06-17 NOTE — Telephone Encounter (Signed)
Patient is requesting call back on her cell with x ray results

## 2016-06-17 NOTE — Assessment & Plan Note (Signed)
Mild, dry Check cxr  She does not feel sick

## 2016-06-17 NOTE — Assessment & Plan Note (Signed)
Chronic - soft stools - diarrhea Take imodium as needed CT scan neg for cause/diverticulitis Has LUQ/left lower rib pain Start omeprazole daily

## 2016-06-17 NOTE — Assessment & Plan Note (Signed)
Left lower rib cage is tender - possible rib fx or costochondritis Check rib xray/cxr

## 2016-06-17 NOTE — Telephone Encounter (Signed)
LVM for pt to call back.

## 2016-06-18 NOTE — Telephone Encounter (Signed)
Spoke with pt to inform.  

## 2016-06-30 ENCOUNTER — Ambulatory Visit: Payer: Medicare Other | Admitting: Nurse Practitioner

## 2016-06-30 ENCOUNTER — Encounter: Payer: Self-pay | Admitting: Internal Medicine

## 2016-06-30 ENCOUNTER — Ambulatory Visit (INDEPENDENT_AMBULATORY_CARE_PROVIDER_SITE_OTHER): Payer: Medicare Other | Admitting: Internal Medicine

## 2016-06-30 VITALS — BP 142/60 | HR 79 | Ht 67.0 in | Wt 140.4 lb

## 2016-06-30 DIAGNOSIS — R002 Palpitations: Secondary | ICD-10-CM

## 2016-06-30 NOTE — Patient Instructions (Signed)

## 2016-06-30 NOTE — Progress Notes (Signed)
HPI Mrs. Bickert returns today for followup. She is a very pleasant 82 year old woman with a history of unexplained syncope in the setting of a non-ST elevation MI. This had occurred all after undergoing radiation therapy for breast cancer. She underwent catheterization which demonstrated no obstructive coronary disease.  She has aortic valve sclerosis by echo. She has fallen ( no syncope) and broke her humerous and is in a splint.  Allergies  Allergen Reactions  . Augmentin [Amoxicillin-Pot Clavulanate] Other (See Comments)    Gi upset only no rash or hives   . Cortisone Other (See Comments)    Makes blood sugars run high  . Keflex [Cephalexin] Hives and Nausea Only    Stomach upset  . Prednisone     Other reaction(s): Other Makes blood sugars run high  . Hydrocodone Rash    Rash to chest , side back  . Latex Rash    Powder in gloves causes rash; "not allergic to latex just the powder"     Current Outpatient Prescriptions  Medication Sig Dispense Refill  . ALPRAZolam (XANAX) 0.5 MG tablet Take 0.5 tablets (0.25 mg total) by mouth 2 (two) times daily as needed for anxiety or sleep. 30 tablet 5  . aspirin 81 MG tablet Take 81 mg by mouth daily.    . B Complex-C (SUPER B COMPLEX PO) Take 1 tablet by mouth daily.    . cholecalciferol (VITAMIN D) 1000 UNITS tablet Take 5,000 Units by mouth daily.     . Cyanocobalamin (VITAMIN B-12 PO) Take 1,000 mg by mouth daily.    . fish oil-omega-3 fatty acids 1000 MG capsule Take 2 g by mouth daily.    [name] glucose blood (ONE TOUCH ULTRA TEST) test strip Used to test blood sugars 8 times a day    . hydrochlorothiazide (MICROZIDE) 12.5 MG capsule Take 12.5 mg by mouth daily as needed (swelling in ankles).    . Insulin Glargine (LANTUS SOLOSTAR) 100 UNIT/ML Solostar Pen Inject 16 Units into the skin daily at 10 pm.     . insulin lispro (HUMALOG KWIKPEN) 100 UNIT/ML KiwkPen Inject into the skin. Sliding scale before meal    . levothyroxine (SYNTHROID,  LEVOTHROID) 112 MCG tablet Take 1 tablet (112 mcg total) by mouth daily before breakfast. --- Must have labs for further refills. 90 tablet 0  . losartan (COZAAR) 100 MG tablet Take 1 tablet (100 mg total) by mouth daily. 90 tablet 3  . Multiple Vitamin (MULTIVITAMIN) tablet Take 1 tablet by mouth daily. For breast and bone health    . simvastatin (ZOCOR) 20 MG tablet Take 20 mg by mouth at bedtime.     . vitamin C (ASCORBIC ACID) 500 MG tablet Take 1,000 mg by mouth daily.    . vitamin E 400 UNIT capsule Take 400 Units by mouth daily.    . metoprolol (LOPRESSOR) 50 MG tablet Take 1/2 tablet by mouth daily as needed for rapid palpitations.  May repeat one in 1 hour if palpitations continue.     No current facility-administered medications for this visit.      Past Medical History:  Diagnosis Date  . Arthritis    fingers  . Cancer of upper-outer quadrant of female breast ([place]) 08/21/2011   ER +  PR +  Her 2 -  Ki67 9%   0.9 cm invasive lobular  Carcinoma ,s/p central lumpectomy with sentinel node biopsy,  ER/PR positive s/p re-excion on 09/16/11 with final pathology showing atypical hyperplasia   .  Diabetes mellitus    type wears insulin pump  . Diabetes mellitus type 1 (HCC)    type I- wears insulin pump  . GERD (gastroesophageal reflux disease)   . Hypercholesteremia   . Hypertension   . Hyponatremia   . Hypothyroid   . Hypothyroidism   . Osteoarthritis   . Osteoporosis, post-menopausal    DEXA 12/02/11: -3.5 (with lomax gyn), declines bisphos due to bone pain  . PAF (paroxysmal atrial fibrillation) ([place])    One episode in 2008, spontaneously converted in the ED, no further treatment  . Spondylosis    thoracic spine  . Thyroid nodule dx 07/2011   [place] q 81moto follow calcified L thyroid nodule (12/08/11 [place]    ROS:   All systems reviewed and negative except as noted in the HPI.   Past Surgical History:  Procedure Laterality Date  . ABDOMINAL HYSTERECTOMY    . ABDOMINAL  HYSTERECTOMY  yrs ago  . APPENDECTOMY    . APPENDECTOMY  age 82 . BLADDER REPAIR  5 yrs ago  . BREAST LUMPECTOMY    . BREAST SURGERY     biopsy  . BREAST SURGERY   yrs ago   br bx  . BREAST SURGERY  01/ 23/2013   left central lumpectomy and snbx, re-excision lumpectomy  . CARDIOVASCULAR STRESS TEST  03/17/2012  . LEFT HEART CATHETERIZATION WITH CORONARY ANGIOGRAM N/A 03/18/2012   Procedure: LEFT HEART CATHETERIZATION WITH CORONARY ANGIOGRAM;  Surgeon: JMinus Breeding MD;  [facility];  Service: Cardiovascular;  Laterality: N/A;  . TONSILLECTOMY   age 82    Family History  Problem Relation Age of Onset  . Cancer Sister     unknown  . Hypertension Mother   . Stroke Mother   . Arthritis Father   . Heart disease Father   . Diabetes Son      Social History   Social History  . Marital status: Married    Spouse name: N/A  . Number of children: N/A  . Years of education: N/A   Occupational History  . Not on file.   Social History Main Topics  . Smoking status: Former Smoker    Packs/day: 1.00    Years: 10.00    Types: Cigarettes    Quit date: 08/27/1977  . Smokeless tobacco: Never Used  . Alcohol use Yes     Comment: rare  . Drug use: No  . Sexual activity: Yes    Birth control/ protection: Post-menopausal   Other Topics Concern  . Not on file   Social History Narrative   ** Merged History Encounter **         BP (!) 142/60   Pulse 79   Ht '5\' 7"'$  (1.702 m)   Wt 140 lb 6.4 oz (63.7 kg)   BMI 21.99 kg/m   Physical Exam:  Well appearing elderly woman, who looks younger than her stated age, and in NAD HEENT: Unremarkable Neck:  6 cm JVD, no thyromegally Lungs:  Clear with no wheezes, rales, or rhonchi. HEART:  Regular rate rhythm, no murmurs, no rubs, no clicks Abd:  soft, positive bowel sounds, no organomegally, no rebound, no guarding Ext:  2 plus pulses, no edema, no cyanosis, no clubbing Skin:  No rashes no nodules Neuro:  CN II through  XII intact, motor grossly intact  Cardiac monitor - reviewed Echo - reviewed Stress test - reivewed  Assess/Plan:  1. Palpitations - her symptoms are improved but still  present. She will continue her current meds. 2. HTN - her blood pressure is fairly well controlled. Will follow. 3. Chest pain - she has a h/o non-obstructive heart cath. Will follow. Her symptoms are quiet at this point.  Mikle Bosworth.D.

## 2016-07-08 ENCOUNTER — Other Ambulatory Visit (INDEPENDENT_AMBULATORY_CARE_PROVIDER_SITE_OTHER): Payer: Medicare Other

## 2016-07-08 ENCOUNTER — Ambulatory Visit (INDEPENDENT_AMBULATORY_CARE_PROVIDER_SITE_OTHER): Payer: Medicare Other | Admitting: Internal Medicine

## 2016-07-08 VITALS — BP 134/62 | HR 88 | Temp 98.1°F | Resp 16 | Wt 143.0 lb

## 2016-07-08 DIAGNOSIS — E1043 Type 1 diabetes mellitus with diabetic autonomic (poly)neuropathy: Secondary | ICD-10-CM

## 2016-07-08 DIAGNOSIS — I10 Essential (primary) hypertension: Secondary | ICD-10-CM

## 2016-07-08 DIAGNOSIS — E039 Hypothyroidism, unspecified: Secondary | ICD-10-CM | POA: Diagnosis not present

## 2016-07-08 DIAGNOSIS — E871 Hypo-osmolality and hyponatremia: Secondary | ICD-10-CM

## 2016-07-08 DIAGNOSIS — R197 Diarrhea, unspecified: Secondary | ICD-10-CM

## 2016-07-08 LAB — COMPREHENSIVE METABOLIC PANEL
ALBUMIN: 4.2 g/dL (ref 3.5–5.2)
ALT: 11 U/L (ref 0–35)
AST: 21 U/L (ref 0–37)
Alkaline Phosphatase: 95 U/L (ref 39–117)
BUN: 12 mg/dL (ref 6–23)
CHLORIDE: 95 meq/L — AB (ref 96–112)
CO2: 27 meq/L (ref 19–32)
CREATININE: 0.62 mg/dL (ref 0.40–1.20)
Calcium: 10 mg/dL (ref 8.4–10.5)
GFR: 97.22 mL/min (ref >60.00)
Glucose, Bld: 181 mg/dL — ABNORMAL HIGH (ref 70–99)
POTASSIUM: 3.7 meq/L (ref 3.5–5.1)
SODIUM: 130 meq/L — AB (ref 135–145)
Total Bilirubin: 0.6 mg/dL (ref 0.2–1.2)
Total Protein: 7.6 g/dL (ref 6.0–8.3)

## 2016-07-08 LAB — HEMOGLOBIN A1C: Hgb A1c MFr Bld: 7 % — ABNORMAL HIGH (ref 4.6–6.5)

## 2016-07-08 LAB — TSH: TSH: 7.42 u[IU]/mL — AB (ref 0.35–4.50)

## 2016-07-08 NOTE — Patient Instructions (Addendum)

## 2016-07-08 NOTE — Progress Notes (Signed)
Subjective:    Patient ID: [name], female    DOB: 09/28/1928, 82 y.o.   MRN: [id number]  HPI The patient is here for follow up.  She broke her right arm after she fell at home.  She slipped on the hard woof floor.  She also hit her head.  She has had headaches since falling.  Hypertension: She is taking her medication daily. She is compliant with a low sodium diet.  She denies chest pain, palpitations, edema, shortness of breath and lightheadedness. She is not exercising regularly.  She does not monitor her blood pressure at home, but when she saw cardiology her BP was very good.     GERD:  She stopped the omeprazole due to dizziness and nausea. She denies GERD symptoms.   She has BM 2-3 times a day and they are loose.  She has to strain to have a bowel movement or urinate.  She has seen GI in the past and her last colonoscopy was normal.  She was advised to take fiber daily, which she is.  She states that has helped.  She has not tried increasing the fiber.  She has not discussed the straining to urinate with her gyn.   Medications and allergies reviewed with patient and updated if appropriate.  Patient Active Problem List   Diagnosis Date Noted  . Cough 06/17/2016  . Rib pain on left side 06/17/2016  . AP (abdominal pain) 03/10/2016  . Diarrhea 03/10/2016  . Hearing loss 01/03/2016  . Allergic rhinitis 01/03/2016  . Minimal Coronary Plaque by Cardiac Cath in 2013 09/11/2015  . Palpitations 09/06/2015  . Frequent loose stools 08/28/2015  . Hyponatremia   . Acute mesenteric ischemia ([place])   . Hypothyroidism 07/11/2012  . Precordial pain 03/18/2012  . Syncope 03/18/2012  . Abnormal LFTs   . Hypercholesteremia   . Hypertension   . Diabetes mellitus type 1 ([place])   . Osteoarthritis   . Thyroid nodule   . Osteoporosis, post-menopausal   . Primary cancer of upper outer quadrant of left female breast ([place]) 08/21/2011    Current Outpatient Prescriptions on File Prior to  Visit  Medication Sig Dispense Refill  . ALPRAZolam (XANAX) 0.5 MG tablet Take 0.5 tablets (0.25 mg total) by mouth 2 (two) times daily as needed for anxiety or sleep. 30 tablet 5  . aspirin 81 MG tablet Take 81 mg by mouth daily.    . B Complex-C (SUPER B COMPLEX PO) Take 1 tablet by mouth daily.    . cholecalciferol (VITAMIN D) 1000 UNITS tablet Take 5,000 Units by mouth daily.     . Cyanocobalamin (VITAMIN B-12 PO) Take 1,000 mg by mouth daily.    . fish oil-omega-3 fatty acids 1000 MG capsule Take 2 g by mouth daily.    [name] glucose blood (ONE TOUCH ULTRA TEST) test strip Used to test blood sugars 8 times a day    . hydrochlorothiazide (MICROZIDE) 12.5 MG capsule Take 12.5 mg by mouth daily as needed (swelling in ankles).    . Insulin Glargine (LANTUS SOLOSTAR) 100 UNIT/ML Solostar Pen Inject 16 Units into the skin daily at 10 pm.     . insulin lispro (HUMALOG KWIKPEN) 100 UNIT/ML KiwkPen Inject into the skin. Sliding scale before meal    . levothyroxine (SYNTHROID, LEVOTHROID) 112 MCG tablet Take 1 tablet (112 mcg total) by mouth daily before breakfast. --- Must have labs for further refills. 90 tablet 0  . losartan (COZAAR) 100  MG tablet Take 1 tablet (100 mg total) by mouth daily. 90 tablet 3  . metoprolol (LOPRESSOR) 50 MG tablet Take 1/2 tablet by mouth daily as needed for rapid palpitations.  May repeat one in 1 hour if palpitations continue.    . Multiple Vitamin (MULTIVITAMIN) tablet Take 1 tablet by mouth daily. For breast and bone health    . simvastatin (ZOCOR) 20 MG tablet Take 20 mg by mouth at bedtime.     . vitamin C (ASCORBIC ACID) 500 MG tablet Take 1,000 mg by mouth daily.    . vitamin E 400 UNIT capsule Take 400 Units by mouth daily.     No current facility-administered medications on file prior to visit.     Past Medical History:  Diagnosis Date  . Arthritis    fingers  . Cancer of upper-outer quadrant of female breast ([place]) 08/21/2011   ER +  PR +  Her 2 -  Ki67 9%    0.9 cm invasive lobular  Carcinoma ,s/p central lumpectomy with sentinel node biopsy,  ER/PR positive s/p re-excion on 09/16/11 with final pathology showing atypical hyperplasia   . Diabetes mellitus    type wears insulin pump  . Diabetes mellitus type 1 (HCC)    type I- wears insulin pump  . GERD (gastroesophageal reflux disease)   . Hypercholesteremia   . Hypertension   . Hyponatremia   . Hypothyroid   . Hypothyroidism   . Osteoarthritis   . Osteoporosis, post-menopausal    DEXA 12/02/11: -3.5 (with lomax gyn), declines bisphos due to bone pain  . PAF (paroxysmal atrial fibrillation) ([place])    One episode in 2008, spontaneously converted in the ED, no further treatment  . Spondylosis    thoracic spine  . Thyroid nodule dx 07/2011   [place] q 53moto follow calcified L thyroid nodule (12/08/11 [place]    Past Surgical History:  Procedure Laterality Date  . ABDOMINAL HYSTERECTOMY    . ABDOMINAL HYSTERECTOMY  yrs ago  . APPENDECTOMY    . APPENDECTOMY  age 89 . BLADDER REPAIR  5 yrs ago  . BREAST LUMPECTOMY    . BREAST SURGERY     biopsy  . BREAST SURGERY   yrs ago   br bx  . BREAST SURGERY  01/ 23/2013   left central lumpectomy and snbx, re-excision lumpectomy  . CARDIOVASCULAR STRESS TEST  03/17/2012  . LEFT HEART CATHETERIZATION WITH CORONARY ANGIOGRAM N/A 03/18/2012   Procedure: LEFT HEART CATHETERIZATION WITH CORONARY ANGIOGRAM;  Surgeon: JMinus Breeding MD;  [facility];  Service: Cardiovascular;  Laterality: N/A;  . TONSILLECTOMY   age 82   Social History   Social History  . Marital status: Married    Spouse name: N/A  . Number of children: N/A  . Years of education: N/A   Social History Main Topics  . Smoking status: Former Smoker    Packs/day: 1.00    Years: 10.00    Types: Cigarettes    Quit date: 08/27/1977  . Smokeless tobacco: Never Used  . Alcohol use Yes     Comment: rare  . Drug use: No  . Sexual activity: Yes    Birth control/ protection:  Post-menopausal   Other Topics Concern  . Not on file   Social History Narrative   ** Merged History Encounter **        Family History  Problem Relation Age of Onset  . Cancer Sister     unknown  .  Hypertension Mother   . Stroke Mother   . Arthritis Father   . Heart disease Father   . Diabetes Son     Review of Systems  Constitutional: Negative for fever.  Respiratory: Negative for cough, shortness of breath and wheezing.   Cardiovascular: Negative for chest pain, palpitations and leg swelling.  Neurological: Positive for headaches. Negative for dizziness and light-headedness.       Objective:   Vitals:   07/08/16 1448  BP: 134/62  Pulse: 88  Resp: 16  Temp: 98.1 F (36.7 C)   Filed Weights   07/08/16 1448  Weight: 143 lb (64.9 kg)   Body mass index is 22.4 kg/m.   Physical Exam    Constitutional: Appears well-developed and well-nourished. No distress.  HENT:  Head: Normocephalic, fading bruises right orbit.  Neck: Neck supple. No tracheal deviation present. No thyromegaly present.  No cervical lymphadenopathy Cardiovascular: Normal rate, regular rhythm and normal heart sounds.   No murmur heard. No carotid bruit .  No edema Pulmonary/Chest: Effort normal and breath sounds normal. No respiratory distress. No has no wheezes. No rales.  Abdomen: soft, nontender Msk: right arm in a sling Skin: Skin is warm and dry. Not diaphoretic.  Psychiatric: Normal mood and affect. Behavior is normal.      Assessment & Plan:    See Problem List for Assessment and Plan of chronic medical problems.

## 2016-07-08 NOTE — Progress Notes (Signed)
Pre visit review using our clinic review tool, if applicable. No additional management support is needed unless otherwise documented below in the visit note. 

## 2016-07-09 ENCOUNTER — Encounter: Payer: Self-pay | Admitting: Internal Medicine

## 2016-07-09 ENCOUNTER — Other Ambulatory Visit: Payer: Self-pay | Admitting: Internal Medicine

## 2016-07-09 DIAGNOSIS — E039 Hypothyroidism, unspecified: Secondary | ICD-10-CM

## 2016-07-09 DIAGNOSIS — S42201A Unspecified fracture of upper end of right humerus, initial encounter for closed fracture: Secondary | ICD-10-CM | POA: Insufficient documentation

## 2016-07-09 MED ORDER — LEVOTHYROXINE SODIUM 125 MCG PO TABS: 125.0000 ug | ORAL_TABLET | Freq: Every day | ORAL | 3 refills | Status: DC

## 2016-07-09 NOTE — Assessment & Plan Note (Signed)
hctz increased - will check cmp

## 2016-07-09 NOTE — Assessment & Plan Note (Signed)
Check tsh  Titrate med dose if needed  

## 2016-07-09 NOTE — Assessment & Plan Note (Signed)
?   Cause and why she has to strain to have a BM Has GI appt and agree she needs to see them again Will try increasing fiber in morning Will discuss needing to strain to urinate with gyn ? Related

## 2016-07-09 NOTE — Assessment & Plan Note (Addendum)
BP well controlled Current regimen effective and well tolerated Continue current medications at current doses cmp - has had low Na with Hctz in the past

## 2016-07-09 NOTE — Assessment & Plan Note (Signed)
Has been well controlled Check a1c Follows with endo

## 2016-07-10 ENCOUNTER — Other Ambulatory Visit: Payer: Self-pay | Admitting: Emergency Medicine

## 2016-07-10 MED ORDER — AMLODIPINE BESYLATE 5 MG PO TABS: 5.0000 mg | ORAL_TABLET | Freq: Every day | ORAL | 0 refills | Status: DC

## 2016-07-10 MED ORDER — LEVOTHYROXINE SODIUM 125 MCG PO TABS: 125.0000 ug | ORAL_TABLET | Freq: Every day | ORAL | 0 refills | Status: DC

## 2016-08-17 ENCOUNTER — Encounter: Payer: Self-pay | Admitting: Internal Medicine

## 2016-08-17 ENCOUNTER — Other Ambulatory Visit (INDEPENDENT_AMBULATORY_CARE_PROVIDER_SITE_OTHER): Payer: Medicare Other

## 2016-08-17 DIAGNOSIS — E039 Hypothyroidism, unspecified: Secondary | ICD-10-CM | POA: Diagnosis not present

## 2016-08-17 LAB — TSH: TSH: 0.45 u[IU]/mL (ref 0.35–4.50)

## 2016-08-21 MED ORDER — LEVOTHYROXINE SODIUM 112 MCG PO TABS: 112.0000 ug | ORAL_TABLET | Freq: Every day | ORAL | 0 refills | Status: DC

## 2016-08-27 ENCOUNTER — Encounter: Payer: Medicare Other | Admitting: Nurse Practitioner

## 2016-08-27 NOTE — Progress Notes (Signed)
CLINIC:  Survivorship   REASON FOR VISIT:  Routine follow-up for history of breast cancer.   BRIEF ONCOLOGIC HISTORY:    Primary cancer of upper outer quadrant of left female breast ([place])   09/02/2011 Surgery    Left lumpectomy: grade 1 lobular carcinoma measuring 0.9 cm with LCIS Medial and superior margins were focally positive, reexcision margins negative, 0/1 sentinel node, ER/PR positive HER-2 negative      09/25/2011 - 01/07/2012 Anti-estrogen oral therapy    Patient could not tolerate tamoxifen, also did not receive radiation at [facility] because of coronary artery disease and patient stopped after one radiation treatment        INTERVAL HISTORY:  [name] presents to the [facility] today for routine follow-up for her history of breast cancer.  Overall, she reports feeling quite well. She is now five years out from her diagnosis of left breast invasive lobular carcinoma.  She is doing well for the most part.  She has had some loose stools (for the past 2 years) and has undergone evaluation by [name] her PCP that included a CT scan.  She is going to see [name] in GI at [facility] soon about this as she has had a bowel resection in the past.  She most recently broke her right arm this past November and is recovering from that.  She will go next week to see [place] ortho and determine if she can start physical therapy.  She does report doing well since her breast cancer diagnosis.  She could not tolerate anti-estrogen therapy, and did not complete radiation.  She does have some anxiety, though not around the breast cancer diagnosis, but more around her diarrhea.  She is concerned that the diarrhea may have something to do with her cancer.  Her last CT scan was in August, 2017 and was normal with the exception of constipation.  Patient has been doing well from a breast cancer stand point and is without any questions or concerns.      REVIEW OF SYSTEMS:  Review of  Systems  Constitutional: Negative for chills, fever and malaise/fatigue.  HENT: Negative for hearing loss and tinnitus.   Eyes: Negative for blurred vision and double vision.  Respiratory: Negative for cough and shortness of breath.   Cardiovascular: Positive for palpitations (chronic). Negative for chest pain and leg swelling.  Gastrointestinal: Positive for diarrhea. Negative for abdominal pain, blood in stool, heartburn, melena, nausea and vomiting.  Genitourinary: Negative for dysuria and urgency.  Musculoskeletal: Negative for back pain and myalgias.  Skin: Negative for rash.  Neurological: Negative for dizziness and headaches.  Psychiatric/Behavioral: Negative for depression. The patient is nervous/anxious.   Breast: Denies any new nodularity, masses, tenderness, nipple changes, or nipple discharge.    A 14-point review of systems was completed and was negative, except as noted above.    PAST MEDICAL/SURGICAL HISTORY:  Past Medical History:  Diagnosis Date  . Arthritis    fingers  . Cancer of upper-outer quadrant of female breast ([place]) 08/21/2011   ER +  PR +  Her 2 -  Ki67 9%   0.9 cm invasive lobular  Carcinoma ,s/p central lumpectomy with sentinel node biopsy,  ER/PR positive s/p re-excion on 09/16/11 with final pathology showing atypical hyperplasia   . Diabetes mellitus    type wears insulin pump  . Diabetes mellitus type 1 (HCC)    type I- wears insulin pump  . GERD (gastroesophageal reflux disease)   .  Hypercholesteremia   . Hypertension   . Hyponatremia   . Hypothyroid   . Hypothyroidism   . Osteoarthritis   . Osteoporosis, post-menopausal    DEXA 12/02/11: -3.5 (with lomax gyn), declines bisphos due to bone pain  . PAF (paroxysmal atrial fibrillation) ([place])    One episode in 2008, spontaneously converted in the ED, no further treatment  . Spondylosis    thoracic spine  . Thyroid nodule dx 07/2011   [place] q 20moto follow calcified L thyroid nodule (12/08/11 [place]    Past Surgical History:  Procedure Laterality Date  . ABDOMINAL HYSTERECTOMY    . ABDOMINAL HYSTERECTOMY  yrs ago  . APPENDECTOMY    . APPENDECTOMY  age 83 . BLADDER REPAIR  5 yrs ago  . BREAST LUMPECTOMY    . BREAST SURGERY     biopsy  . BREAST SURGERY   yrs ago   br bx  . BREAST SURGERY  01/ 23/2013   left central lumpectomy and snbx, re-excision lumpectomy  . CARDIOVASCULAR STRESS TEST  03/17/2012  . LEFT HEART CATHETERIZATION WITH CORONARY ANGIOGRAM N/A 03/18/2012   Procedure: LEFT HEART CATHETERIZATION WITH CORONARY ANGIOGRAM;  Surgeon: JMinus Breeding MD;  [facility];  Service: Cardiovascular;  Laterality: N/A;  . TONSILLECTOMY   age 83    ALLERGIES:  Allergies  Allergen Reactions  . Augmentin [Amoxicillin-Pot Clavulanate] Other (See Comments)    Gi upset only no rash or hives   . Cortisone Other (See Comments)    Makes blood sugars run high  . Keflex [Cephalexin] Hives and Nausea Only    Stomach upset  . Omeprazole Nausea Only    Dizziness and nausea  . Prednisone     Other reaction(s): Other Makes blood sugars run high  . Hydrocodone Rash    Rash to chest , side back  . Latex Rash    Powder in gloves causes rash; "not allergic to latex just the powder"     CURRENT MEDICATIONS:  Outpatient Encounter Prescriptions as of 08/28/2016  Medication Sig Note  . ALPRAZolam (XANAX) 0.5 MG tablet Take 0.5 tablets (0.25 mg total) by mouth 2 (two) times daily as needed for anxiety or sleep.   .Marland Kitchenaspirin 81 MG tablet Take 81 mg by mouth daily.   . B Complex-C (SUPER B COMPLEX PO) Take 1 tablet by mouth daily.   . cholecalciferol (VITAMIN D) 1000 UNITS tablet Take 5,000 Units by mouth daily.    . Cyanocobalamin (VITAMIN B-12 PO) Take 1,000 mg by mouth daily.   . fish oil-omega-3 fatty acids 1000 MG capsule Take 2 g by mouth daily.   .Marland Kitchenglucose blood (ONE TOUCH ULTRA TEST) test strip Used to test blood sugars 8 times a day   . hydrochlorothiazide (MICROZIDE) 12.5  MG capsule Take 12.5 mg by mouth every other day.    . Insulin Glargine (LANTUS SOLOSTAR) 100 UNIT/ML Solostar Pen Inject 16 Units into the skin daily at 10 pm.  02[facility]18/2015: [facility] . insulin lispro (HUMALOG KWIKPEN) 100 UNIT/ML KiwkPen Inject into the skin. Sliding scale before meal   . levothyroxine (SYNTHROID, LEVOTHROID) 112 MCG tablet Take 1 tablet (112 mcg total) by mouth daily before breakfast.   . losartan (COZAAR) 100 MG tablet Take 1 tablet (100 mg total) by mouth daily.   . Multiple Vitamin (MULTIVITAMIN) tablet Take 1 tablet by mouth daily. For breast and bone health   . simvastatin (ZOCOR) 20 MG  tablet Take 20 mg by mouth at bedtime.    . vitamin C (ASCORBIC ACID) 500 MG tablet Take 1,000 mg by mouth daily.   . vitamin E 400 UNIT capsule Take 400 Units by mouth daily.   [name] amLODipine (NORVASC) 5 MG tablet Take 1 tablet (5 mg total) by mouth daily. (Patient not taking: Reported on 08/28/2016)   . metoprolol (LOPRESSOR) 50 MG tablet Take 1/2 tablet by mouth daily as needed for rapid palpitations.  May repeat one in 1 hour if palpitations continue.    No facility-administered encounter medications on file as of 08/28/2016.      ONCOLOGIC FAMILY HISTORY:  Family History  Problem Relation Age of Onset  . Cancer Sister     unknown  . Hypertension Mother   . Stroke Mother   . Arthritis Father   . Heart disease Father   . Diabetes Son     GENETIC COUNSELING/TESTING: Not done  SOCIAL HISTORY:  [name] is married and lives alone/with her husband in [place], [place].  She has 6 children, many grandchildren and great grandchildren who are spread "all around".  Ms. Musil is currently retired.  She denies any current or history of tobacco, or illicit drug use.  She does drink a glass of wine with dinner.     PHYSICAL EXAMINATION:  Vital Signs: Vitals:   08/28/16 1350  BP: (!) 171/54  Pulse: 83  Resp: 17  Temp: 97.5 F (36.4 C)    Filed Weights   08/28/16 1350  Weight: 142 lb 1.6 oz (64.5 kg)   General: Well-nourished, well-appearing female in no acute distress.  Accompanied by her husband today, but he is in the waiting room. HEENT: Head is normocephalic.  Pupils equal and reactive to light. Conjunctivae clear without exudate.  Sclerae anicteric. Oral mucosa is pink, moist.  Oropharynx is pink without lesions or erythema.  Lymph: No cervical, supraclavicular, or infraclavicular lymphadenopathy bilaterally noted on palpation.  Cardiovascular: Regular rate and rhythm.[name] Respiratory: Clear to auscultation bilaterally. Chest expansion symmetric; breathing non-labored.  Breast Exam:  -Left breast: No appreciable masses on palpation. No skin redness, thickening, or peau d'orange appearance; no nipple retraction or nipple discharge; mild distortion in symmetry at previous lumpectomy site well healed scar without erythema or nodularity. Nipple is surgically absent. -Right breast: No appreciable masses on palpation. No skin redness, thickening, or peau d'orange appearance; no nipple retraction or nipple discharge.  Patient unable to raise arm up during breast exam due to recent shoulder fracture. -Axilla: No axillary adenopathy bilaterally.  GI: Abdomen soft and round; non-tender, non-distended. Bowel sounds normoactive. No hepatosplenomegaly.   GU: Deferred.  Neuro: No focal deficits. Steady gait.  Psych: Mood and affect normal and appropriate for situation.  Extremities: No edema. Skin: Warm and dry.  LABORATORY DATA:  None for this visit  DIAGNOSTIC IMAGING:  Most recent mammogram:       ASSESSMENT AND PLAN:  Ms.. Daniely is a pleasant 83 y.o. female with history of Stage IA left breast invasive lobular carcinoma, ER+/PR+/HER2-, diagnosed in 08/2011, treated with lumpectomy, unable to tolerate Tamoxifen, and declined adjuvant radiation.  She presents to the Survivorship Clinic for surveillance and routine follow-up.    1. History of breast cancer:  Ms. Holben is currently clinically and radiographically without evidence of disease or recurrence of breast cancer. She will be due for mammogram in 12/29/2016; orders placed today.   She will return to the cancer center to see her medical oncologist, Dr.  Lindi Adie, in January, 2019.  She has follow up scheduled with her surgeon, Dr. Serita Grammes in March, 2018.  I encouraged her to call me with any questions or concerns before her next visit at the cancer center, and I would be happy to see her sooner, if needed.    2. Bone health:  Given Ms. Heringer age, history of breast cancer, she is at risk for bone demineralization. Her last DEXA scan was on 01/23/2014 and is currently followed by her primary care provider.  In the meantime, she was encouraged to increase her consumption of foods rich in calcium, as well as increase her weight-bearing activities.  She was given education on specific food and activities to promote bone health and we discussed this in detail once she finishes her physical therapy for her recent fracture.    3. Cancer screening:  Due to Ms. Base history and her age, she should receive screening for skin cancers. She was encouraged to follow-up with her PCP for appropriate cancer screenings.   4. Health maintenance and wellness promotion: Ms. Milian was encouraged to consume 5-7 servings of fruits and vegetables per day. She was also encouraged to engage in moderate to vigorous exercise for 30 minutes per day most days of the week. She was instructed to limit her alcohol consumption and continue to abstain from tobacco use.   Dispo:  -Return to cancer center in January, 2019 for follow up with Dr. Lindi Adie   A total of (30) minutes of face-to-face time was spent with this patient with greater than 50% of that time in counseling and care-coordination.   Mike Craze, NP Survivorship Program [facility] [phone]   Note: PRIMARY  CARE PROVIDER Binnie Rail, [place] [phone]

## 2016-08-28 ENCOUNTER — Encounter: Payer: Self-pay | Admitting: Adult Health

## 2016-08-28 ENCOUNTER — Ambulatory Visit (HOSPITAL_BASED_OUTPATIENT_CLINIC_OR_DEPARTMENT_OTHER): Payer: Medicare Other | Admitting: Adult Health

## 2016-08-28 VITALS — BP 171/54 | HR 83 | Temp 97.5°F | Resp 17 | Ht 67.0 in | Wt 142.1 lb

## 2016-08-28 DIAGNOSIS — E039 Hypothyroidism, unspecified: Secondary | ICD-10-CM

## 2016-08-28 DIAGNOSIS — C50412 Malignant neoplasm of upper-outer quadrant of left female breast: Secondary | ICD-10-CM

## 2016-08-28 DIAGNOSIS — Z853 Personal history of malignant neoplasm of breast: Secondary | ICD-10-CM

## 2016-08-28 DIAGNOSIS — E119 Type 2 diabetes mellitus without complications: Secondary | ICD-10-CM | POA: Diagnosis not present

## 2016-09-15 ENCOUNTER — Encounter: Payer: Self-pay | Admitting: Internal Medicine

## 2016-09-15 ENCOUNTER — Ambulatory Visit (INDEPENDENT_AMBULATORY_CARE_PROVIDER_SITE_OTHER): Payer: Medicare Other | Admitting: Internal Medicine

## 2016-09-15 ENCOUNTER — Encounter (INDEPENDENT_AMBULATORY_CARE_PROVIDER_SITE_OTHER): Payer: Medicare Other

## 2016-09-15 VITALS — BP 166/84 | HR 95 | Temp 98.1°F | Resp 16 | Wt 142.0 lb

## 2016-09-15 DIAGNOSIS — E039 Hypothyroidism, unspecified: Secondary | ICD-10-CM

## 2016-09-15 DIAGNOSIS — R197 Diarrhea, unspecified: Secondary | ICD-10-CM

## 2016-09-15 DIAGNOSIS — E871 Hypo-osmolality and hyponatremia: Secondary | ICD-10-CM

## 2016-09-15 DIAGNOSIS — E1043 Type 1 diabetes mellitus with diabetic autonomic (poly)neuropathy: Secondary | ICD-10-CM

## 2016-09-15 DIAGNOSIS — I10 Essential (primary) hypertension: Secondary | ICD-10-CM

## 2016-09-15 DIAGNOSIS — E7800 Pure hypercholesterolemia, unspecified: Secondary | ICD-10-CM | POA: Diagnosis not present

## 2016-09-15 LAB — COMPREHENSIVE METABOLIC PANEL
ALBUMIN: 4.1 g/dL (ref 3.5–5.2)
ALK PHOS: 73 U/L (ref 39–117)
ALT: 12 U/L (ref 0–35)
AST: 21 U/L (ref 0–37)
BILIRUBIN TOTAL: 0.6 mg/dL (ref 0.2–1.2)
BUN: 12 mg/dL (ref 6–23)
CO2: 26 mEq/L (ref 19–32)
Calcium: 9.7 mg/dL (ref 8.4–10.5)
Chloride: 96 mEq/L (ref 96–112)
Creatinine, Ser: 0.7 mg/dL (ref 0.40–1.20)
GFR: 84.47 mL/min (ref >60.00)
GLUCOSE: 266 mg/dL — AB (ref 70–99)
Potassium: 3.9 mEq/L (ref 3.5–5.1)
SODIUM: 128 meq/L — AB (ref 135–145)
TOTAL PROTEIN: 7.5 g/dL (ref 6.0–8.3)

## 2016-09-15 LAB — TSH: TSH: 1.18 u[IU]/mL (ref 0.35–4.50)

## 2016-09-15 MED ORDER — ALPRAZOLAM 0.5 MG PO TABS: 0.2500 mg | ORAL_TABLET | Freq: Two times a day (BID) | ORAL | 5 refills | Status: DC | PRN

## 2016-09-15 MED ORDER — LOSARTAN POTASSIUM 100 MG PO TABS: 100.0000 mg | ORAL_TABLET | Freq: Every day | ORAL | 3 refills | Status: DC

## 2016-09-15 NOTE — Assessment & Plan Note (Signed)
BP well controlled at home Current regimen effective and well tolerated Continue current medications at current doses cmp 

## 2016-09-15 NOTE — Progress Notes (Signed)
Subjective:    Patient ID: [name], female    DOB: 1928/08/21, 83 y.o.   MRN: [id number]  HPI The patient is here for follow up.  Hyperlipidemia: She is taking her medication daily. She is compliant with a low fat/cholesterol diet. She is dong PT for her arm and that is her main exercise now.  She denies myalgias.   Hypothyroidism:  She is taking her medication daily.  She denies any recent changes in energy or weight that are unexplained.   Diabetes: She is following with endocrine.  She is taking her medication daily as prescribed. She is compliant with a diabetic diet. She is doing PT for exercise.  She checks her feet daily and denies foot lesions. She is up-to-date with an ophthalmology examination.   Hypertension: She is taking her medication daily. She is compliant with a low sodium diet.  Her leg edema is controlled.  She has occasional palpitations. She denies chest pain, shortness of breath and regular headaches. She is exercising regularly.  She does monitor her blood pressure at home - 110/60.     Medications and allergies reviewed with patient and updated if appropriate.  Patient Active Problem List   Diagnosis Date Noted  . Closed fracture of proximal end of right humerus 07/09/2016  . Cough 06/17/2016  . Rib pain on left side 06/17/2016  . AP (abdominal pain) 03/10/2016  . Hearing loss 01/03/2016  . Allergic rhinitis 01/03/2016  . Minimal Coronary Plaque by Cardiac Cath in 2013 09/11/2015  . Palpitations 09/06/2015  . Frequent loose stools 08/28/2015  . Hyponatremia   . Acute mesenteric ischemia ([place])   . Hypothyroidism 07/11/2012  . Precordial pain 03/18/2012  . Syncope 03/18/2012  . Abnormal LFTs   . Hypercholesteremia   . Hypertension   . Diabetes mellitus type 1 ([place])   . Osteoarthritis   . Thyroid nodule   . Osteoporosis, post-menopausal   . Primary cancer of upper outer quadrant of left female breast ([place]) 08/21/2011    Current Outpatient  Prescriptions on File Prior to Visit  Medication Sig Dispense Refill  . ALPRAZolam (XANAX) 0.5 MG tablet Take 0.5 tablets (0.25 mg total) by mouth 2 (two) times daily as needed for anxiety or sleep. 30 tablet 5  . aspirin 81 MG tablet Take 81 mg by mouth daily.    . B Complex-C (SUPER B COMPLEX PO) Take 1 tablet by mouth daily.    . cholecalciferol (VITAMIN D) 1000 UNITS tablet Take 5,000 Units by mouth daily.     . Cyanocobalamin (VITAMIN B-12 PO) Take 1,000 mg by mouth daily.    . fish oil-omega-3 fatty acids 1000 MG capsule Take 2 g by mouth daily.    [name] glucose blood (ONE TOUCH ULTRA TEST) test strip Used to test blood sugars 8 times a day    . hydrochlorothiazide (MICROZIDE) 12.5 MG capsule Take 12.5 mg by mouth every other day.     . Insulin Glargine (LANTUS SOLOSTAR) 100 UNIT/ML Solostar Pen Inject 16 Units into the skin daily at 10 pm.     . insulin lispro (HUMALOG KWIKPEN) 100 UNIT/ML KiwkPen Inject into the skin. Sliding scale before meal    . levothyroxine (SYNTHROID, LEVOTHROID) 112 MCG tablet Take 1 tablet (112 mcg total) by mouth daily before breakfast. 90 tablet 0  . losartan (COZAAR) 100 MG tablet Take 1 tablet (100 mg total) by mouth daily. 90 tablet 3  . metoprolol (LOPRESSOR) 50 MG tablet Take 1/2  tablet by mouth daily as needed for rapid palpitations.  May repeat one in 1 hour if palpitations continue.    . Multiple Vitamin (MULTIVITAMIN) tablet Take 1 tablet by mouth daily. For breast and bone health    . simvastatin (ZOCOR) 20 MG tablet Take 20 mg by mouth at bedtime.     . vitamin C (ASCORBIC ACID) 500 MG tablet Take 1,000 mg by mouth daily.    . vitamin E 400 UNIT capsule Take 400 Units by mouth daily.    . amLODipine (NORVASC) 5 MG tablet Take 1 tablet (5 mg total) by mouth daily. (Patient not taking: Reported on 08/28/2016) 90 tablet 0   No current facility-administered medications on file prior to visit.     Past Medical History:  Diagnosis Date  . Arthritis     fingers  . Cancer of upper-outer quadrant of female breast ([facility]) 08/21/2011   ER +  PR +  Her 2 -  Ki67 9%   0.9 cm invasive lobular  Carcinoma ,s/p central lumpectomy with sentinel node biopsy,  ER/PR positive s/p re-excion on 09/16/11 with final pathology showing atypical hyperplasia   . Diabetes mellitus    type wears insulin pump  . Diabetes mellitus type 1 (HCC)    type I- wears insulin pump  . GERD (gastroesophageal reflux disease)   . Hypercholesteremia   . Hypertension   . Hyponatremia   . Hypothyroid   . Hypothyroidism   . Osteoarthritis   . Osteoporosis, post-menopausal    DEXA 12/02/11: -3.5 (with lomax gyn), declines bisphos due to bone pain  . PAF (paroxysmal atrial fibrillation) ([facility])    One episode in 2008, spontaneously converted in the ED, no further treatment  . Spondylosis    thoracic spine  . Thyroid nodule dx 07/2011   US q 6mo to follow calcified L thyroid nodule (12/08/11 [facility])    Past Surgical History:  Procedure Laterality Date  . ABDOMINAL HYSTERECTOMY    . ABDOMINAL HYSTERECTOMY  yrs ago  . APPENDECTOMY    . APPENDECTOMY  age 15  . BLADDER REPAIR  5 yrs ago  . BREAST LUMPECTOMY    . BREAST SURGERY     biopsy  . BREAST SURGERY   yrs ago   br bx  . BREAST SURGERY  01/ 23/2013   left central lumpectomy and snbx, re-excision lumpectomy  . CARDIOVASCULAR STRESS TEST  03/17/2012  . LEFT HEART CATHETERIZATION WITH CORONARY ANGIOGRAM N/A 03/18/2012   Procedure: LEFT HEART CATHETERIZATION WITH CORONARY ANGIOGRAM;  Surgeon: James Hochrein, MD;  Location: [facility] CATH LAB;  Service: Cardiovascular;  Laterality: N/A;  . TONSILLECTOMY   age 23    Social History   Social History  . Marital status: Married    Spouse name: N/A  . Number of children: N/A  . Years of education: N/A   Social History Main Topics  . Smoking status: Former Smoker    Packs/day: 1.00    Years: 10.00    Types: Cigarettes    Quit date: 08/27/1977  . Smokeless tobacco: Never Used  . Alcohol  use Yes     Comment: rare  . Drug use: No  . Sexual activity: Yes    Birth control/ protection: Post-menopausal   Other Topics Concern  . None   Social History Narrative   ** Merged History Encounter **        Family History  Problem Relation Age of Onset  . Cancer Sister     unknown  .   Hypertension Mother   . Stroke Mother   . Arthritis Father   . Heart disease Father   . Diabetes Son     Review of Systems  Constitutional: Negative for chills and fever.  HENT: Positive for postnasal drip.   Respiratory: Positive for cough (from PND). Negative for shortness of breath and wheezing.   Cardiovascular: Positive for palpitations (occ) and leg swelling (mild, controlled). Negative for chest pain.  Neurological: Negative for light-headedness and headaches.       Objective:   Vitals:   09/15/16 1338  BP: (!) 166/84  Pulse: 95  Resp: 16  Temp: 98.1 F (36.7 C)   Wt Readings from Last 3 Encounters:  09/15/16 142 lb (64.4 kg)  08/28/16 142 lb 1.6 oz (64.5 kg)  07/08/16 143 lb (64.9 kg)   Body mass index is 22.24 kg/m.   Physical Exam    Constitutional: Appears well-developed and well-nourished. No distress.  HENT:  Head: Normocephalic and atraumatic.  Neck: Neck supple. No tracheal deviation present. No thyromegaly present.  No cervical lymphadenopathy Cardiovascular: Normal rate, regular rhythm and normal heart sounds.   2/6 systolic murmur heard. No carotid bruit .  No edema Pulmonary/Chest: Effort normal and breath sounds normal. No respiratory distress. No has no wheezes. No rales.  Skin: Skin is warm and dry. Not diaphoretic.  Psychiatric: Normal mood and affect. Behavior is normal.     Assessment & Plan:    See Problem List for Assessment and Plan of chronic medical problems.   FU in 6 months  

## 2016-09-15 NOTE — Assessment & Plan Note (Signed)
Not fasting - so can not check lipids Continue statin

## 2016-09-15 NOTE — Assessment & Plan Note (Signed)
Following with endocrine Sugars controlled at home Management per endocrine

## 2016-09-15 NOTE — Assessment & Plan Note (Signed)
Check cmp Taking hctz 12.5 mg daily

## 2016-09-15 NOTE — Assessment & Plan Note (Signed)
Check tsh  Titrate med dose if needed  

## 2016-09-15 NOTE — Assessment & Plan Note (Addendum)
She saw Dr Derrill Kay - he recommended biofeedback - he feels there is a problem with her rectum She has had complete evaluation and all tests were normal Will try biofeedback

## 2016-09-15 NOTE — Patient Instructions (Addendum)

## 2016-09-26 ENCOUNTER — Ambulatory Visit (INDEPENDENT_AMBULATORY_CARE_PROVIDER_SITE_OTHER): Payer: Medicare Other | Admitting: Family Medicine

## 2016-09-26 ENCOUNTER — Encounter: Payer: Self-pay | Admitting: Family Medicine

## 2016-09-26 VITALS — BP 158/70 | HR 88 | Temp 98.5°F | Wt 142.4 lb

## 2016-09-26 DIAGNOSIS — R05 Cough: Secondary | ICD-10-CM

## 2016-09-26 DIAGNOSIS — R059 Cough, unspecified: Secondary | ICD-10-CM

## 2016-09-26 LAB — POCT INFLUENZA A/B
INFLUENZA A, POC: NEGATIVE
Influenza B, POC: NEGATIVE

## 2016-09-26 MED ORDER — PROMETHAZINE-DM 6.25-15 MG/5ML PO SYRP: 5.0000 mL | ORAL_SOLUTION | Freq: Four times a day (QID) | ORAL | 0 refills | Status: DC | PRN

## 2016-09-26 MED ORDER — AZITHROMYCIN 250 MG PO TABS: ORAL_TABLET | ORAL | 1 refills | Status: DC

## 2016-09-26 NOTE — Progress Notes (Signed)
SUBJECTIVE:  [name] is a 83 y.o. female pt of Dr. Quay Burow, new to me,  Presents to weekend clinic with symptoms of{ cough, congestion, productive cough, sinus pressure and fever, tmax 100  for 7 days. She denies a history of myalgias, shortness of breath, vomiting and weakness and denies a history of asthma. Patient denies smoke cigarettes.   Current Outpatient Prescriptions on File Prior to Visit  Medication Sig Dispense Refill  . ALPRAZolam (XANAX) 0.5 MG tablet Take 0.5 tablets (0.25 mg total) by mouth 2 (two) times daily as needed for anxiety or sleep. 30 tablet 5  . aspirin 81 MG tablet Take 81 mg by mouth daily.    . B Complex-C (SUPER B COMPLEX PO) Take 1 tablet by mouth daily.    . BD PEN NEEDLE NANO U/F 32G X 4 MM MISC   0  . cholecalciferol (VITAMIN D) 1000 UNITS tablet Take 5,000 Units by mouth daily.     . Cyanocobalamin (VITAMIN B-12 PO) Take 1,000 mg by mouth daily.    . fish oil-omega-3 fatty acids 1000 MG capsule Take 2 g by mouth daily.    [name] glucose blood (ONE TOUCH ULTRA TEST) test strip Used to test blood sugars 8 times a day    . glucose blood (ONE TOUCH ULTRA TEST) test strip CHECK BLOOD SUGARS 8 TIMES DAILY    . hydrochlorothiazide (MICROZIDE) 12.5 MG capsule Take 12.5 mg by mouth every other day.     . Insulin Glargine (LANTUS SOLOSTAR) 100 UNIT/ML Solostar Pen Inject 16 Units into the skin daily at 10 pm.     . insulin lispro (HUMALOG KWIKPEN) 100 UNIT/ML KiwkPen Inject into the skin. Sliding scale before meal    . insulin lispro (HUMALOG) 100 UNIT/ML KiwkPen Inject into the skin.    [name] levothyroxine (SYNTHROID, LEVOTHROID) 112 MCG tablet Take 1 tablet (112 mcg total) by mouth daily before breakfast. 90 tablet 0  . losartan (COZAAR) 100 MG tablet Take 1 tablet (100 mg total) by mouth daily. 90 tablet 3  . metoprolol (LOPRESSOR) 50 MG tablet Take 1/2 tablet by mouth daily as needed for rapid palpitations.  May repeat one in 1 hour if palpitations continue.    .  Multiple Vitamin (MULTIVITAMIN) tablet Take 1 tablet by mouth daily. For breast and bone health    . simvastatin (ZOCOR) 20 MG tablet Take 20 mg by mouth at bedtime.     . vitamin C (ASCORBIC ACID) 500 MG tablet Take 1,000 mg by mouth daily.    . vitamin E 400 UNIT capsule Take 400 Units by mouth daily.     No current facility-administered medications on file prior to visit.     Allergies  Allergen Reactions  . Amlodipine     Dizzy, nausea  . Augmentin [Amoxicillin-Pot Clavulanate] Other (See Comments)    Gi upset only no rash or hives   . Cortisone Other (See Comments)    Makes blood sugars run high  . Keflex [Cephalexin] Hives and Nausea Only    Stomach upset  . Omeprazole Nausea Only    Dizziness and nausea  . Prednisone     Other reaction(s): Other Makes blood sugars run high  . Hydrocodone Rash    Rash to chest , side back  . Latex Rash    Powder in gloves causes rash; "not allergic to latex just the powder"    Past Medical History:  Diagnosis Date  . Arthritis    fingers  .  Cancer of upper-outer quadrant of female breast ([place]) 08/21/2011   ER +  PR +  Her 2 -  Ki67 9%   0.9 cm invasive lobular  Carcinoma ,s/p central lumpectomy with sentinel node biopsy,  ER/PR positive s/p re-excion on 09/16/11 with final pathology showing atypical hyperplasia   . Diabetes mellitus    type wears insulin pump  . Diabetes mellitus type 1 (HCC)    type I- wears insulin pump  . GERD (gastroesophageal reflux disease)   . Hypercholesteremia   . Hypertension   . Hyponatremia   . Hypothyroid   . Hypothyroidism   . Osteoarthritis   . Osteoporosis, post-menopausal    DEXA 12/02/11: -3.5 (with lomax gyn), declines bisphos due to bone pain  . PAF (paroxysmal atrial fibrillation) ([place])    One episode in 2008, spontaneously converted in the ED, no further treatment  . Spondylosis    thoracic spine  . Thyroid nodule dx 07/2011   [place] q 3moto follow calcified L thyroid nodule (12/08/11 [place]     Past Surgical History:  Procedure Laterality Date  . ABDOMINAL HYSTERECTOMY    . ABDOMINAL HYSTERECTOMY  yrs ago  . APPENDECTOMY    . APPENDECTOMY  age [id number] . BLADDER REPAIR  5 yrs ago  . BREAST LUMPECTOMY    . BREAST SURGERY     biopsy  . BREAST SURGERY   yrs ago   br bx  . BREAST SURGERY  01/ 23/2013   left central lumpectomy and snbx, re-excision lumpectomy  . CARDIOVASCULAR STRESS TEST  03/17/2012  . LEFT HEART CATHETERIZATION WITH CORONARY ANGIOGRAM N/A 03/18/2012   Procedure: LEFT HEART CATHETERIZATION WITH CORONARY ANGIOGRAM;  Surgeon: JMinus Breeding MD;  [facility];  Service: Cardiovascular;  Laterality: N/A;  . TONSILLECTOMY   age 83   Family History  Problem Relation Age of Onset  . Cancer Sister     unknown  . Hypertension Mother   . Stroke Mother   . Arthritis Father   . Heart disease Father   . Diabetes Son     Social History   Social History  . Marital status: Married    Spouse name: N/A  . Number of children: N/A  . Years of education: N/A   Occupational History  . Not on file.   Social History Main Topics  . Smoking status: Former Smoker    Packs/day: 1.00    Years: 10.00    Types: Cigarettes    Quit date: 08/27/1977  . Smokeless tobacco: Never Used  . Alcohol use Yes     Comment: rare  . Drug use: No  . Sexual activity: Yes    Birth control/ protection: Post-menopausal   Other Topics Concern  . Not on file   Social History Narrative   ** Merged History Encounter **       The PMH, PSH, Social History, Family History, Medications, and allergies have [facility] and have been updated if relevant.  OBJECTIVE: BP (!) 158/70   Pulse 88   Temp 98.5 F (36.9 C) (Oral)   Wt 142 lb 6.4 oz (64.6 kg)   SpO2 94%   BMI 22.30 kg/m   She appears well, vital signs are as noted. Ears normal.  Throat and pharynx normal.  Neck supple. No adenopathy in the neck. Nose is congested. Sinuses non tender. Scattered exp  wheezes  ASSESSMENT:  bronchitis  PLAN: Zpack, promethazine dm as needed for cough- discussed sedation  precautions. Symptomatic therapy suggested: push fluids, rest and return office visit prn if symptoms persist or worsen.   Call or return to clinic prn if these symptoms worsen or fail to improve as anticipated.

## 2016-09-26 NOTE — Progress Notes (Signed)
Pre visit review using our clinic review tool, if applicable. No additional management support is needed unless otherwise documented below in the visit note. 

## 2016-10-13 ENCOUNTER — Other Ambulatory Visit: Payer: Self-pay | Admitting: Internal Medicine

## 2016-10-14 ENCOUNTER — Other Ambulatory Visit: Payer: Self-pay | Admitting: Internal Medicine

## 2016-11-27 ENCOUNTER — Ambulatory Visit (INDEPENDENT_AMBULATORY_CARE_PROVIDER_SITE_OTHER): Payer: Medicare Other | Admitting: Family

## 2016-11-27 ENCOUNTER — Encounter: Payer: Self-pay | Admitting: Family

## 2016-11-27 DIAGNOSIS — R05 Cough: Secondary | ICD-10-CM | POA: Diagnosis not present

## 2016-11-27 DIAGNOSIS — R059 Cough, unspecified: Secondary | ICD-10-CM

## 2016-11-27 MED ORDER — FLUTICASONE FUROATE-VILANTEROL 100-25 MCG/INH IN AEPB: 1.0000 | INHALATION_SPRAY | Freq: Every day | RESPIRATORY_TRACT | 0 refills | Status: DC

## 2016-11-27 MED ORDER — AZITHROMYCIN 250 MG PO TABS: ORAL_TABLET | ORAL | 0 refills | Status: DC

## 2016-11-27 MED ORDER — PROMETHAZINE-CODEINE 6.25-10 MG/5ML PO SYRP: 5.0000 mL | ORAL_SOLUTION | Freq: Four times a day (QID) | ORAL | 0 refills | Status: DC | PRN

## 2016-11-27 NOTE — Progress Notes (Signed)
Subjective:    Patient ID: [name], female    DOB: Mar 06, 1929, 83 y.o.   MRN: [id number]  Chief Complaint  Patient presents with  . Cough    x4 days, cough and congestion    HPI:  [name] is a 83 y.o. female who  has a past medical history of Arthritis; Cancer of upper-outer quadrant of female breast ([place]) (08/21/2011); Diabetes mellitus; Diabetes mellitus type 1 ([place]); GERD (gastroesophageal reflux disease); Hypercholesteremia; Hypertension; Hyponatremia; Hypothyroid; Hypothyroidism; Osteoarthritis; Osteoporosis, post-menopausal; PAF (paroxysmal atrial fibrillation) ([place]); Spondylosis; and Thyroid nodule (dx 07/2011). and presents today for an acute office visit.  This is a new problem. Associated symptoms of cough and congestion have been going on for about 4 days. Possible low grade fever yesterday, but nothing today. Other modifying factors include cough drops. Cough is described as non-productive. Timing of the symptoms is worse in the morning and evening. No other sick contacts. Most recent antibiotics was in February for bronchitis and sinusitis. Course of the symptoms have stayed about the same since initial onset.   Allergies  Allergen Reactions  . Amlodipine     Dizzy, nausea  . Augmentin [Amoxicillin-Pot Clavulanate] Other (See Comments)    Gi upset only no rash or hives   . Cortisone Other (See Comments)    Makes blood sugars run high  . Keflex [Cephalexin] Hives and Nausea Only    Stomach upset  . Omeprazole Nausea Only    Dizziness and nausea  . Prednisone     Other reaction(s): Other Makes blood sugars run high  . Hydrocodone Rash    Rash to chest , side back  . Latex Rash    Powder in gloves causes rash; "not allergic to latex just the powder"      Outpatient Medications Prior to Visit  Medication Sig Dispense Refill  . ALPRAZolam (XANAX) 0.5 MG tablet Take 0.5 tablets (0.25 mg total) by mouth 2 (two) times daily as needed for anxiety or  sleep. 30 tablet 5  . aspirin 81 MG tablet Take 81 mg by mouth daily.    . B Complex-C (SUPER B COMPLEX PO) Take 1 tablet by mouth daily.    . BD INSULIN SYRINGE ULTRAFINE 31G X 15/64" 0.3 ML MISC USE WITH INJECTIONS 100 each 0  . BD PEN NEEDLE NANO U/F 32G X 4 MM MISC   0  . cholecalciferol (VITAMIN D) 1000 UNITS tablet Take 5,000 Units by mouth daily.     . Cyanocobalamin (VITAMIN B-12 PO) Take 1,000 mg by mouth daily.    . fish oil-omega-3 fatty acids 1000 MG capsule Take 2 g by mouth daily.    [name] glucose blood (ONE TOUCH ULTRA TEST) test strip Used to test blood sugars 8 times a day    . glucose blood (ONE TOUCH ULTRA TEST) test strip CHECK BLOOD SUGARS 8 TIMES DAILY    . hydrochlorothiazide (MICROZIDE) 12.5 MG capsule Take 12.5 mg by mouth every other day.     . Insulin Glargine (LANTUS SOLOSTAR) 100 UNIT/ML Solostar Pen Inject 16 Units into the skin daily at 10 pm.     . insulin lispro (HUMALOG KWIKPEN) 100 UNIT/ML KiwkPen Inject into the skin. Sliding scale before meal    . insulin lispro (HUMALOG) 100 UNIT/ML KiwkPen Inject into the skin.    [name] levothyroxine (SYNTHROID, LEVOTHROID) 112 MCG tablet TAKE 1 TABLET ONCE DAILY. 90 tablet 1  . losartan (COZAAR) 100 MG tablet Take 1 tablet (100 mg  total) by mouth daily. 90 tablet 3  . metoprolol (LOPRESSOR) 50 MG tablet Take 1/2 tablet by mouth daily as needed for rapid palpitations.  May repeat one in 1 hour if palpitations continue.    . Multiple Vitamin (MULTIVITAMIN) tablet Take 1 tablet by mouth daily. For breast and bone health    . simvastatin (ZOCOR) 20 MG tablet Take 20 mg by mouth at bedtime.     . vitamin C (ASCORBIC ACID) 500 MG tablet Take 1,000 mg by mouth daily.    . vitamin E 400 UNIT capsule Take 400 Units by mouth daily.    [name] azithromycin (ZITHROMAX Z-PAK) 250 MG tablet Use as directed 6 tablet 1  . promethazine-dextromethorphan (PROMETHAZINE-DM) 6.25-15 MG/5ML syrup Take 5 mLs by mouth 4 (four) times daily as needed for cough.  118 mL 0   No facility-administered medications prior to visit.      Review of Systems  Constitutional: Negative for chills and fever.  HENT: Positive for congestion, ear pain, sinus pain and sinus pressure. Negative for sore throat.   Respiratory: Positive for cough and wheezing. Negative for chest tightness and shortness of breath.   Neurological: Negative for headaches.      Objective:    BP (!) 146/70 (BP Location: Right Arm, Patient Position: Sitting, Cuff Size: Normal)   Pulse 78   Temp 98.2 F (36.8 C) (Oral)   Resp 16   Ht 5\' 7"  (1.702 m)   Wt 140 lb (63.5 kg)   SpO2 94%   BMI 21.93 kg/m  Nursing note and vital signs reviewed.  Physical Exam  Constitutional: She is oriented to person, place, and time. She appears well-developed and well-nourished.  HENT:  Right Ear: Hearing, tympanic membrane, external ear and ear canal normal.  Left Ear: Hearing, tympanic membrane, external ear and ear canal normal.  Nose: Right sinus exhibits maxillary sinus tenderness and frontal sinus tenderness. Left sinus exhibits maxillary sinus tenderness and frontal sinus tenderness.  Mouth/Throat: Uvula is midline, oropharynx is clear and moist and mucous membranes are normal.  Neck: Neck supple.  Cardiovascular: Normal rate, regular rhythm, normal heart sounds and intact distal pulses.   Pulmonary/Chest: Effort normal. No respiratory distress. She has wheezes. She has no rales. She exhibits no tenderness.  Neurological: She is alert and oriented to person, place, and time.  Skin: Skin is warm and dry.       Assessment & Plan:   Problem List Items Addressed This Visit      Other   Cough    New onset cough and congestion with concern for bronchitis versus sinusitis. Sample of Breo provided. Start promethazine-codeine as needed for cough and sleep. Written prescription of azithromycin provided if symptoms do not improve over the next 24-48 hours. Continue over-the-counter medications as  needed for symptom relief and supportive care.      Relevant Medications   azithromycin (ZITHROMAX) 250 MG tablet   promethazine-codeine (PHENERGAN WITH CODEINE) 6.25-10 MG/5ML syrup   fluticasone furoate-vilanterol (BREO ELLIPTA) 100-25 MCG/INH AEPB       I have discontinued Ms. Denbow's azithromycin and promethazine-dextromethorphan. I am also having her start on azithromycin, promethazine-codeine, and fluticasone furoate-vilanterol. Additionally, I am having her maintain her simvastatin, cholecalciferol, aspirin, fish oil-omega-3 fatty acids, multivitamin, vitamin C, glucose blood, Insulin Glargine, insulin lispro, metoprolol, vitamin E, B Complex-C (SUPER B COMPLEX PO), Cyanocobalamin (VITAMIN B-12 PO), hydrochlorothiazide, BD PEN NEEDLE NANO U/F, insulin lispro, glucose blood, losartan, ALPRAZolam, BD INSULIN SYRINGE ULTRAFINE, and levothyroxine.  Meds ordered this encounter  Medications  . azithromycin (ZITHROMAX) 250 MG tablet    Sig: Take 2 tablets by mouth for 1 day and then 1 tablet by mouth daily for 4 days.    Dispense:  6 tablet    Refill:  0    Order Specific Question:   Supervising Provider    Answer:   Pricilla Holm A [[id number]]  . promethazine-codeine (PHENERGAN WITH CODEINE) 6.25-10 MG/5ML syrup    Sig: Take 5 mLs by mouth every 6 (six) hours as needed.    Dispense:  180 mL    Refill:  0    Order Specific Question:   Supervising Provider    Answer:   Pricilla Holm A [[id number]]  . fluticasone furoate-vilanterol (BREO ELLIPTA) 100-25 MCG/INH AEPB    Sig: Inhale 1 puff into the lungs daily.    Dispense:  14 each    Refill:  0    Order Specific Question:   Supervising Provider    Answer:   Pricilla Holm A [[id number]]     Follow-up: Return if symptoms worsen or fail to improve.  Mauricio Po, FNP

## 2016-11-27 NOTE — Patient Instructions (Addendum)
Thank you for choosing [facility].  SUMMARY AND INSTRUCTIONS:  Please start taking the Breo once daily and may stop at any time.  If symptoms worsen start the antibiotic.   For cough if Promethazine is too expensive - Delsym or Robitussin.   Medication:  Your prescription(s) have been submitted to your pharmacy or been printed and provided for you. Please take as directed and contact our office if you believe you are having problem(s) with the medication(s) or have any questions.  Referrals:  Referrals have been made during this visit. You should expect to hear back from our schedulers in about 7-10 days in regards to establishing an appointment with the specialists we discussed.   Follow up:  If your symptoms worsen or fail to improve, please contact our office for further instruction, or in case of emergency go directly to the emergency room at the closest medical facility.   General Recommendations:    Please drink plenty of fluids.  Get plenty of rest   Sleep in humidified air  Use saline nasal sprays  Netti pot   OTC Medications:  Decongestants - helps relieve congestion   Flonase (generic fluticasone) or Nasacort (generic triamcinolone) - please make sure to use the "cross-over" technique at a 45 degree angle towards the opposite eye as opposed to straight up the nasal passageway.   Sudafed (generic pseudoephedrine - Note this is the one that is available behind the pharmacy counter); Products with phenylephrine (-PE) may also be used but is often not as effective as pseudoephedrine.   If you have HIGH BLOOD PRESSURE - Coricidin HBP; AVOID any product that is -D as this contains pseudoephedrine which may increase your blood pressure.  Afrin (oxymetazoline) every 6-8 hours for up to 3 days.   Allergies - helps relieve runny nose, itchy eyes and sneezing   Claritin (generic loratidine), Allegra (fexofenidine), or Zyrtec (generic cyrterizine) for runny  nose. These medications should not cause drowsiness.  Note - Benadryl (generic diphenhydramine) may be used however may cause drowsiness  Cough -   Delsym or Robitussin (generic dextromethorphan)  Expectorants - helps loosen mucus to ease removal   Mucinex (generic guaifenesin) as directed on the package.  Headaches / General Aches   Tylenol (generic acetaminophen) - DO NOT EXCEED 3 grams (3,000 mg) in a 24 hour time period  Advil/Motrin (generic ibuprofen)   Sore Throat -   Salt water gargle   Chloraseptic (generic benzocaine) spray or lozenges / Sucrets (generic dyclonine)

## 2016-11-27 NOTE — Assessment & Plan Note (Signed)
New onset cough and congestion with concern for bronchitis versus sinusitis. Sample of Breo provided. Start promethazine-codeine as needed for cough and sleep. Written prescription of azithromycin provided if symptoms do not improve over the next 24-48 hours. Continue over-the-counter medications as needed for symptom relief and supportive care.

## 2016-12-05 ENCOUNTER — Ambulatory Visit (HOSPITAL_COMMUNITY)
Admission: RE | Admit: 2016-12-05 | Discharge: 2016-12-05 | Disposition: A | Discharge status: Home / Self Care | Payer: Medicare Other | Source: Ambulatory Visit | Attending: Family Medicine | Admitting: Family Medicine

## 2016-12-05 ENCOUNTER — Emergency Department (HOSPITAL_COMMUNITY)
Admission: EM | Admit: 2016-12-05 | Discharge: 2016-12-05 | Discharge status: Absent without leave | Payer: Medicare Other

## 2016-12-05 ENCOUNTER — Encounter: Payer: Self-pay | Admitting: Family Medicine

## 2016-12-05 ENCOUNTER — Ambulatory Visit (INDEPENDENT_AMBULATORY_CARE_PROVIDER_SITE_OTHER): Payer: Medicare Other | Admitting: Family Medicine

## 2016-12-05 ENCOUNTER — Other Ambulatory Visit (HOSPITAL_COMMUNITY)
Admission: RE | Admit: 2016-12-05 | Discharge: 2016-12-05 | Disposition: A | Discharge status: Home / Self Care | Payer: Medicare Other

## 2016-12-05 VITALS — BP 142/78 | HR 79 | Temp 98.1°F | Wt 138.0 lb

## 2016-12-05 DIAGNOSIS — I10 Essential (primary) hypertension: Secondary | ICD-10-CM

## 2016-12-05 DIAGNOSIS — R05 Cough: Secondary | ICD-10-CM | POA: Insufficient documentation

## 2016-12-05 DIAGNOSIS — I700 Atherosclerosis of aorta: Secondary | ICD-10-CM | POA: Diagnosis not present

## 2016-12-05 DIAGNOSIS — E1043 Type 1 diabetes mellitus with diabetic autonomic (poly)neuropathy: Secondary | ICD-10-CM | POA: Diagnosis not present

## 2016-12-05 DIAGNOSIS — J189 Pneumonia, unspecified organism: Secondary | ICD-10-CM

## 2016-12-05 DIAGNOSIS — R059 Cough, unspecified: Secondary | ICD-10-CM

## 2016-12-05 LAB — CBC WITH DIFFERENTIAL/PLATELET
Basophils Absolute: 0 10*3/uL (ref 0.0–0.1)
Basophils Relative: 0 %
Eosinophils Absolute: 0.2 10*3/uL (ref 0.0–0.7)
Eosinophils Relative: 2 %
HEMATOCRIT: 37.3 % (ref 36.0–46.0)
HEMOGLOBIN: 13.4 g/dL (ref 12.0–15.0)
LYMPHS ABS: 3.5 10*3/uL (ref 0.7–4.0)
LYMPHS PCT: 36 %
MCH: 31.1 pg (ref 26.0–34.0)
MCHC: 35.9 g/dL (ref 30.0–36.0)
MCV: 86.5 fL (ref 78.0–100.0)
MONOS PCT: 9 %
Monocytes Absolute: 0.9 10*3/uL (ref 0.1–1.0)
NEUTROS ABS: 5 10*3/uL (ref 1.7–7.7)
NEUTROS PCT: 53 %
PLATELETS: 271 10*3/uL (ref 150–400)
RBC: 4.31 MIL/uL (ref 3.87–5.11)
RDW: 12.4 % (ref 11.5–15.5)
WBC: 9.6 10*3/uL (ref 4.0–10.5)

## 2016-12-05 LAB — COMPREHENSIVE METABOLIC PANEL
ALBUMIN: 4.5 g/dL (ref 3.5–5.0)
ALT: 17 U/L (ref 14–54)
AST: 31 U/L (ref 15–41)
Alkaline Phosphatase: 85 U/L (ref 38–126)
Anion gap: 9 (ref 5–15)
BUN: 18 mg/dL (ref 6–20)
CHLORIDE: 94 mmol/L — AB (ref 101–111)
CO2: 26 mmol/L (ref 22–32)
Calcium: 9.9 mg/dL (ref 8.9–10.3)
Creatinine, Ser: 0.78 mg/dL (ref 0.44–1.00)
GFR calc Af Amer: >60 mL/min (ref >60)
GFR calc non Af Amer: >60 mL/min (ref >60)
GLUCOSE: 69 mg/dL (ref 65–99)
POTASSIUM: 4.1 mmol/L (ref 3.5–5.1)
Sodium: 129 mmol/L — ABNORMAL LOW (ref 135–145)
Total Bilirubin: 0.5 mg/dL (ref 0.3–1.2)
Total Protein: 8.3 g/dL — ABNORMAL HIGH (ref 6.5–8.1)

## 2016-12-05 MED ORDER — ALBUTEROL SULFATE HFA 108 (90 BASE) MCG/ACT IN AERS: 2.0000 | INHALATION_SPRAY | Freq: Four times a day (QID) | RESPIRATORY_TRACT | 0 refills | Status: DC | PRN

## 2016-12-05 MED ORDER — BENZONATATE 100 MG PO CAPS: 100.0000 mg | ORAL_CAPSULE | Freq: Three times a day (TID) | ORAL | 0 refills | Status: DC | PRN

## 2016-12-05 NOTE — Assessment & Plan Note (Signed)
Adequately controlled, no changes to meds. Encouraged heart healthy diet such as the DASH diet and exercise as tolerated.

## 2016-12-05 NOTE — Assessment & Plan Note (Signed)
Sugar 204 this am. Highest in past 300 and low of 68

## 2016-12-05 NOTE — Patient Instructions (Addendum)
Zinc such as Coldeeze or Xicam, elderberry liquid helps, Plain Mucinex take twice daily,  Community-Acquired Pneumonia, Adult Pneumonia is an infection of the lungs. One type of pneumonia can happen while a person is in a hospital. A different type can happen when a person is not in a hospital (community-acquired pneumonia). It is easy for this kind to spread from person to person. It can spread to you if you breathe near an infected person who coughs or sneezes. Some symptoms include:  A dry cough.  A wet (productive) cough.  Fever.  Sweating.  Chest pain. Follow these instructions at home:  Take over-the-counter and prescription medicines only as told by your doctor.  Only take cough medicine if you are losing sleep.  If you were prescribed an antibiotic medicine, take it as told by your doctor. Do not stop taking the antibiotic even if you start to feel better.  Sleep with your head and neck raised (elevated). You can do this by putting a few pillows under your head, or you can sleep in a recliner.  Do not use tobacco products. These include cigarettes, chewing tobacco, and e-cigarettes. If you need help quitting, ask your doctor.  Drink enough water to keep your pee (urine) clear or pale yellow. A shot (vaccine) can help prevent pneumonia. Shots are often suggested for:  People older than 81 years of age.  People older than 81 years of age:  Who are having cancer treatment.  Who have long-term (chronic) lung disease.  Who have problems with their body's defense system (immune system). You may also prevent pneumonia if you take these actions:  Get the flu (influenza) shot every year.  Go to the dentist as often as told.  Wash your hands often. If soap and water are not available, use hand sanitizer. Contact a doctor if:  You have a fever.  You lose sleep because your cough medicine does not help. Get help right away if:  You are short of breath and it gets  worse.  You have more chest pain.  Your sickness gets worse. This is very serious if:  You are an older adult.  Your body's defense system is weak.  You cough up blood. This information is not intended to replace advice given to you by your health care provider. Make sure you discuss any questions you have with your health care provider. Document Released: 01/13/2008 Document Revised: 01/02/2016 Document Reviewed: 11/21/2014 Elsevier Interactive Patient Education  2017 [employer].

## 2016-12-06 ENCOUNTER — Encounter: Payer: Self-pay | Admitting: Family Medicine

## 2016-12-06 DIAGNOSIS — J189 Pneumonia, unspecified organism: Secondary | ICD-10-CM | POA: Insufficient documentation

## 2016-12-06 HISTORY — DX: Pneumonia, unspecified organism: J18.9

## 2016-12-06 NOTE — Assessment & Plan Note (Signed)
Rhonchi noted RLQ.and worsening cough, sob, fevers, congestion. CXR does not show any acute consolidation but she felt the Azithromycin she had helped slightly. She will pick up the refill she was given and is given Tessalon perles and albuterol to use prn. Encouraged to add zinc, elderberry and Mjucinex and increase rest and hydration

## 2016-12-06 NOTE — Progress Notes (Signed)
Subjective:    Patient ID: [name], female    DOB: 02/04/1929, 83 y.o.   MRN: [id number]  Chief Complaint  Patient presents with  . Cough    f/u--productive at times white sputum....pt reports Rx cough syrup did help a little and has 1 pill left of Zpack...pt has also has tried OTC Zyrtec and Mucinex with minimal relief  . Headache    HPI Patient is in today for cough, congestion, malaise that are persistent. Rhonchi noted RLQ.and worsening cough, sob, fevers, congestion. She has been sick for several weeks she has had fatigue, anorexia, nausea and cough but she felt the Azithromycin she was given recently helped slightly. Her symptoms persist and she is noting chest pain with cough and worsening SOB with exertion. Did not find Promethazine with Codeine significantly helpful. No fevers, chills, vomiting or diarrhea at this time. Notes some nausea.   Past Medical History:  Diagnosis Date  . Arthritis    fingers  . Cancer of upper-outer quadrant of female breast ([place]) 08/21/2011   ER +  PR +  Her 2 -  Ki67 9%   0.9 cm invasive lobular  Carcinoma ,s/p central lumpectomy with sentinel node biopsy,  ER/PR positive s/p re-excion on 09/16/11 with final pathology showing atypical hyperplasia   . CAP (community acquired pneumonia) 12/06/2016  . Diabetes mellitus    type wears insulin pump  . Diabetes mellitus type 1 (HCC)    type I- wears insulin pump  . GERD (gastroesophageal reflux disease)   . Hypercholesteremia   . Hypertension   . Hyponatremia   . Hypothyroid   . Hypothyroidism   . Osteoarthritis   . Osteoporosis, post-menopausal    DEXA 12/02/11: -3.5 (with lomax gyn), declines bisphos due to bone pain  . PAF (paroxysmal atrial fibrillation) ([place])    One episode in 2008, spontaneously converted in the ED, no further treatment  . Spondylosis    thoracic spine  . Thyroid nodule dx 07/2011   [place] q 66moto follow calcified L thyroid nodule (12/08/11 [place]    Past Surgical History:    Procedure Laterality Date  . ABDOMINAL HYSTERECTOMY    . ABDOMINAL HYSTERECTOMY  yrs ago  . APPENDECTOMY    . APPENDECTOMY  age 83 . BLADDER REPAIR  5 yrs ago  . BREAST LUMPECTOMY    . BREAST SURGERY     biopsy  . BREAST SURGERY   yrs ago   br bx  . BREAST SURGERY  01/ 23/2013   left central lumpectomy and snbx, re-excision lumpectomy  . CARDIOVASCULAR STRESS TEST  03/17/2012  . LEFT HEART CATHETERIZATION WITH CORONARY ANGIOGRAM N/A 03/18/2012   Procedure: LEFT HEART CATHETERIZATION WITH CORONARY ANGIOGRAM;  Surgeon: JMinus Breeding MD;  [facility];  Service: Cardiovascular;  Laterality: N/A;  . TONSILLECTOMY   age 83   Family History  Problem Relation Age of Onset  . Cancer Sister     unknown  . Hypertension Mother   . Stroke Mother   . Arthritis Father   . Heart disease Father   . Diabetes Son     Social History   Social History  . Marital status: Married    Spouse name: N/A  . Number of children: N/A  . Years of education: N/A   Occupational History  . Not on file.   Social History Main Topics  . Smoking status: Former Smoker    Packs/day: 1.00    Years: 10.00  Types: Cigarettes    Quit date: 08/27/1977  . Smokeless tobacco: Never Used  . Alcohol use Yes     Comment: rare  . Drug use: No  . Sexual activity: Yes    Birth control/ protection: Post-menopausal   Other Topics Concern  . Not on file   Social History Narrative   ** Merged History Encounter **        Outpatient Medications Prior to Visit  Medication Sig Dispense Refill  . ALPRAZolam (XANAX) 0.5 MG tablet Take 0.5 tablets (0.25 mg total) by mouth 2 (two) times daily as needed for anxiety or sleep. 30 tablet 5  . aspirin 81 MG tablet Take 81 mg by mouth daily.    [name] azithromycin (ZITHROMAX) 250 MG tablet Take 2 tablets by mouth for 1 day and then 1 tablet by mouth daily for 4 days. 6 tablet 0  . B Complex-C (SUPER B COMPLEX PO) Take 1 tablet by mouth daily.    . BD INSULIN  SYRINGE ULTRAFINE 31G X 15/64" 0.3 ML MISC USE WITH INJECTIONS 100 each 0  . BD PEN NEEDLE NANO U/F 32G X 4 MM MISC   0  . cholecalciferol (VITAMIN D) 1000 UNITS tablet Take 5,000 Units by mouth daily.     . Cyanocobalamin (VITAMIN B-12 PO) Take 1,000 mg by mouth daily.    . fish oil-omega-3 fatty acids 1000 MG capsule Take 2 g by mouth daily.    . fluticasone furoate-vilanterol (BREO ELLIPTA) 100-25 MCG/INH AEPB Inhale 1 puff into the lungs daily. 14 each 0  . glucose blood (ONE TOUCH ULTRA TEST) test strip Used to test blood sugars 8 times a day    . glucose blood (ONE TOUCH ULTRA TEST) test strip CHECK BLOOD SUGARS 8 TIMES DAILY    . hydrochlorothiazide (MICROZIDE) 12.5 MG capsule Take 12.5 mg by mouth every other day.     . Insulin Glargine (LANTUS SOLOSTAR) 100 UNIT/ML Solostar Pen Inject 16 Units into the skin daily at 10 pm.     . insulin lispro (HUMALOG KWIKPEN) 100 UNIT/ML KiwkPen Inject into the skin. Sliding scale before meal    . insulin lispro (HUMALOG) 100 UNIT/ML KiwkPen Inject into the skin.    [name] levothyroxine (SYNTHROID, LEVOTHROID) 112 MCG tablet TAKE 1 TABLET ONCE DAILY. 90 tablet 1  . losartan (COZAAR) 100 MG tablet Take 1 tablet (100 mg total) by mouth daily. 90 tablet 3  . metoprolol (LOPRESSOR) 50 MG tablet Take 1/2 tablet by mouth daily as needed for rapid palpitations.  May repeat one in 1 hour if palpitations continue.    . Multiple Vitamin (MULTIVITAMIN) tablet Take 1 tablet by mouth daily. For breast and bone health    . promethazine-codeine (PHENERGAN WITH CODEINE) 6.25-10 MG/5ML syrup Take 5 mLs by mouth every 6 (six) hours as needed. 180 mL 0  . simvastatin (ZOCOR) 20 MG tablet Take 20 mg by mouth at bedtime.     . vitamin C (ASCORBIC ACID) 500 MG tablet Take 1,000 mg by mouth daily.    . vitamin E 400 UNIT capsule Take 400 Units by mouth daily.     No facility-administered medications prior to visit.     Allergies  Allergen Reactions  . Amlodipine     Dizzy,  nausea  . Augmentin [Amoxicillin-Pot Clavulanate] Other (See Comments)    Gi upset only no rash or hives   . Cortisone Other (See Comments)    Makes blood sugars run high  . Keflex [Cephalexin]  Hives and Nausea Only    Stomach upset  . Omeprazole Nausea Only    Dizziness and nausea  . Prednisone     Other reaction(s): Other Makes blood sugars run high  . Hydrocodone Rash    Rash to chest , side back  . Latex Rash    Powder in gloves causes rash; "not allergic to latex just the powder"    Review of Systems  Constitutional: Negative for fever and malaise/fatigue.  HENT: Positive for congestion.   Eyes: Negative for blurred vision.  Respiratory: Positive for cough, sputum production and shortness of breath.   Cardiovascular: Positive for chest pain. Negative for palpitations and leg swelling.  Gastrointestinal: Positive for nausea. Negative for abdominal pain, blood in stool, diarrhea, heartburn and vomiting.  Genitourinary: Negative.  Negative for dysuria and frequency.  Musculoskeletal: Negative for falls.  Skin: Negative for rash.  Neurological: Positive for headaches. Negative for dizziness and loss of consciousness.  Endo/Heme/Allergies: Negative for environmental allergies.  Psychiatric/Behavioral: Negative for depression. The patient is not nervous/anxious.        Objective:    Physical Exam  Constitutional: She is oriented to person, place, and time. She appears well-developed and well-nourished. No distress.  HENT:  Head: Normocephalic and atraumatic.  Nose: Nose normal.  Nasal mucosa boggy and erythematous.   Eyes: Right eye exhibits no discharge. Left eye exhibits no discharge.  Neck: Normal range of motion. Neck supple.  Cardiovascular: Normal rate and regular rhythm.   No murmur heard. Pulmonary/Chest: Effort normal and breath sounds normal.  Abdominal: Soft. Bowel sounds are normal. There is no tenderness.  Musculoskeletal: She exhibits no edema.    Lymphadenopathy:    She has cervical adenopathy.  Neurological: She is alert and oriented to person, place, and time.  Skin: Skin is warm and dry.  Psychiatric: She has a normal mood and affect.  Nursing note and vitals reviewed.   BP (!) 142/78   Pulse 79   Temp 98.1 F (36.7 C) (Oral)   Wt 138 lb (62.6 kg)   SpO2 95%   BMI 21.61 kg/m  Wt Readings from Last 3 Encounters:  12/05/16 138 lb (62.6 kg)  11/27/16 140 lb (63.5 kg)  09/26/16 142 lb 6.4 oz (64.6 kg)     Lab Results  Component Value Date   WBC 9.6 12/05/2016   HGB 13.4 12/05/2016   HCT 37.3 12/05/2016   PLT 271 12/05/2016   GLUCOSE 69 12/05/2016   CHOL 157 01/08/2015   TRIG 96 01/08/2015   HDL 45 01/08/2015   LDLCALC 93 01/08/2015   ALT 17 12/05/2016   AST 31 12/05/2016   NA 129 (L) 12/05/2016   K 4.1 12/05/2016   CL 94 (L) 12/05/2016   CREATININE 0.78 12/05/2016   BUN 18 12/05/2016   CO2 26 12/05/2016   TSH 1.18 09/15/2016   INR 1.16 03/18/2012   HGBA1C 7.0 (H) 07/08/2016    Lab Results  Component Value Date   TSH 1.18 09/15/2016   Lab Results  Component Value Date   WBC 9.6 12/05/2016   HGB 13.4 12/05/2016   HCT 37.3 12/05/2016   MCV 86.5 12/05/2016   PLT 271 12/05/2016   Lab Results  Component Value Date   NA 129 (L) 12/05/2016   K 4.1 12/05/2016   CHLORIDE 102 08/26/2015   CO2 26 12/05/2016   GLUCOSE 69 12/05/2016   BUN 18 12/05/2016   CREATININE 0.78 12/05/2016   BILITOT 0.5 12/05/2016   ALKPHOS  85 12/05/2016   AST 31 12/05/2016   ALT 17 12/05/2016   PROT 8.3 (H) 12/05/2016   ALBUMIN 4.5 12/05/2016   CALCIUM 9.9 12/05/2016   ANIONGAP 9 12/05/2016   EGFR 70 (L) 08/26/2015   GFR 84.47 09/15/2016   Lab Results  Component Value Date   CHOL 157 01/08/2015   Lab Results  Component Value Date   HDL 45 01/08/2015   Lab Results  Component Value Date   LDLCALC 93 01/08/2015   Lab Results  Component Value Date   TRIG 96 01/08/2015   Lab Results  Component Value Date    CHOLHDL 4 03/05/2014   Lab Results  Component Value Date   HGBA1C 7.0 (H) 07/08/2016       Assessment & Plan:   Problem List Items Addressed This Visit    Hypertension    Adequately controlled, no changes to meds. Encouraged heart healthy diet such as the DASH diet and exercise as tolerated.       Relevant Orders   CBC w/Diff   Comp Met (CMET)   Diabetes mellitus type 1 (HCC)    Sugar 204 this am. Highest in past 300 and low of 68      Cough - Primary   Relevant Medications   benzonatate (TESSALON) 100 MG capsule   Other Relevant Orders   DG Chest 2 View (Completed)   CAP (community acquired pneumonia)    Rhonchi noted RLQ.and worsening cough, sob, fevers, congestion. CXR does not show any acute consolidation but she felt the Azithromycin she had helped slightly. She will pick up the refill she was given and is given Tessalon perles and albuterol to use prn. Encouraged to add zinc, elderberry and Mjucinex and increase rest and hydration      Relevant Medications   benzonatate (TESSALON) 100 MG capsule   albuterol (PROVENTIL HFA;VENTOLIN HFA) 108 (90 Base) MCG/ACT inhaler      I am having Ms. Devins start on benzonatate and albuterol. I am also having her maintain her simvastatin, cholecalciferol, aspirin, fish oil-omega-3 fatty acids, multivitamin, vitamin C, glucose blood, Insulin Glargine, insulin lispro, metoprolol, vitamin E, B Complex-C (SUPER B COMPLEX PO), Cyanocobalamin (VITAMIN B-12 PO), hydrochlorothiazide, BD PEN NEEDLE NANO U/F, insulin lispro, glucose blood, losartan, ALPRAZolam, BD INSULIN SYRINGE ULTRAFINE, levothyroxine, azithromycin, promethazine-codeine, and fluticasone furoate-vilanterol.  Meds ordered this encounter  Medications  . benzonatate (TESSALON) 100 MG capsule    Sig: Take 1-2 capsules (100-200 mg total) by mouth 3 (three) times daily as needed for cough.    Dispense:  30 capsule    Refill:  0  . albuterol (PROVENTIL HFA;VENTOLIN HFA) 108  (90 Base) MCG/ACT inhaler    Sig: Inhale 2 puffs into the lungs every 6 (six) hours as needed for wheezing or shortness of breath.    Dispense:  1 Inhaler    Refill:  0     Penni Homans, MD

## 2017-01-08 LAB — HM PAP SMEAR: HM PAP: NORMAL

## 2017-01-11 ENCOUNTER — Ambulatory Visit
Admission: RE | Admit: 2017-01-11 | Discharge: 2017-01-11 | Disposition: A | Discharge status: Home / Self Care | Payer: Medicare Other | Source: Ambulatory Visit | Attending: Adult Health | Admitting: Adult Health

## 2017-01-11 DIAGNOSIS — C50412 Malignant neoplasm of upper-outer quadrant of left female breast: Secondary | ICD-10-CM

## 2017-01-16 ENCOUNTER — Other Ambulatory Visit: Payer: Self-pay | Admitting: Internal Medicine

## 2017-01-23 NOTE — Progress Notes (Signed)
Subjective:    Patient ID: [name], female    DOB: [birth year], 83 y.o.   MRN: [id number]  HPI She is here for an acute visit for stomach issues.   She feels bloated in her abdomen.  It is in the upper part of the abdomen - it feels like it is growing over the last month and a half.    She still has loose bowel movements.  She can have up to 4 a day.  Some days she does not have any.  She often has cramping with her bowel movements, but it is not every time.  She takes imodium every other day, which helps to control this.  She denies constipation, but has small bowel movmeents and does not feel she is emplyting her self out.  She is very uncomfortable and feels like the abdominal bloating is causing shortness of breath.  She denies blood in the stool, dysuria, increased urinary frequency, fever/chills.      Medications and allergies reviewed with patient and updated if appropriate.  Patient Active Problem List   Diagnosis Date Noted  . CAP (community acquired pneumonia) 12/06/2016  . Closed fracture of proximal end of right humerus 07/09/2016  . Cough 06/17/2016  . Rib pain on left side 06/17/2016  . AP (abdominal pain) 03/10/2016  . Hearing loss 01/03/2016  . Allergic rhinitis 01/03/2016  . Minimal Coronary Plaque by Cardiac Cath in 2013 09/11/2015  . Palpitations 09/06/2015  . Frequent loose stools 08/28/2015  . Hyponatremia   . Acute mesenteric ischemia ([place])   . Hypothyroidism 07/11/2012  . Precordial pain 03/18/2012  . Syncope 03/18/2012  . Abnormal LFTs   . Hypercholesteremia   . Hypertension   . Diabetes mellitus type 1 ([place])   . Osteoarthritis   . Thyroid nodule   . Osteoporosis, post-menopausal   . Primary cancer of upper outer quadrant of left female breast ([place]) 08/21/2011    Current Outpatient Prescriptions on File Prior to Visit  Medication Sig Dispense Refill  . albuterol (PROVENTIL HFA;VENTOLIN HFA) 108 (90 Base) MCG/ACT inhaler Inhale 2 puffs  into the lungs every 6 (six) hours as needed for wheezing or shortness of breath. 1 Inhaler 0  . ALPRAZolam (XANAX) 0.5 MG tablet Take 0.5 tablets (0.25 mg total) by mouth 2 (two) times daily as needed for anxiety or sleep. 30 tablet 5  . aspirin 81 MG tablet Take 81 mg by mouth daily.    . B Complex-C (SUPER B COMPLEX PO) Take 1 tablet by mouth daily.    . BD INSULIN SYRINGE ULTRAFINE 31G X 15/64" 0.3 ML MISC USE WITH INJECTIONS 100 each 3  . BD PEN NEEDLE NANO U/F 32G X 4 MM MISC   0  . cholecalciferol (VITAMIN D) 1000 UNITS tablet Take 5,000 Units by mouth daily.     . Cyanocobalamin (VITAMIN B-12 PO) Take 1,000 mg by mouth daily.    . fish oil-omega-3 fatty acids 1000 MG capsule Take 2 g by mouth daily.    [name] glucose blood (ONE TOUCH ULTRA TEST) test strip CHECK BLOOD SUGARS 8 TIMES DAILY    . hydrochlorothiazide (MICROZIDE) 12.5 MG capsule Take 12.5 mg by mouth every other day.     . Insulin Glargine (LANTUS SOLOSTAR) 100 UNIT/ML Solostar Pen Inject 16 Units into the skin daily at 10 pm.     . insulin lispro (HUMALOG KWIKPEN) 100 UNIT/ML KiwkPen Inject into the skin. Sliding scale before meal    .  insulin lispro (HUMALOG) 100 UNIT/ML KiwkPen Inject into the skin.    [name] levothyroxine (SYNTHROID, LEVOTHROID) 112 MCG tablet TAKE 1 TABLET ONCE DAILY. 90 tablet 1  . losartan (COZAAR) 100 MG tablet Take 1 tablet (100 mg total) by mouth daily. 90 tablet 3  . metoprolol (LOPRESSOR) 50 MG tablet Take 1/2 tablet by mouth daily as needed for rapid palpitations.  May repeat one in 1 hour if palpitations continue.    . Multiple Vitamin (MULTIVITAMIN) tablet Take 1 tablet by mouth daily. For breast and bone health    . simvastatin (ZOCOR) 20 MG tablet Take 20 mg by mouth at bedtime.     . vitamin C (ASCORBIC ACID) 500 MG tablet Take 1,000 mg by mouth daily.    . vitamin E 400 UNIT capsule Take 400 Units by mouth daily.     No current facility-administered medications on file prior to visit.     Past  Medical History:  Diagnosis Date  . Arthritis    fingers  . Cancer of upper-outer quadrant of female breast ([place]) 08/21/2011   ER +  PR +  Her 2 -  Ki67 9%   0.9 cm invasive lobular  Carcinoma ,s/p central lumpectomy with sentinel node biopsy,  ER/PR positive s/p re-excion on 09/16/11 with final pathology showing atypical hyperplasia   . CAP (community acquired pneumonia) 12/06/2016  . Diabetes mellitus    type wears insulin pump  . Diabetes mellitus type 1 (HCC)    type I- wears insulin pump  . GERD (gastroesophageal reflux disease)   . Hypercholesteremia   . Hypertension   . Hyponatremia   . Hypothyroid   . Hypothyroidism   . Osteoarthritis   . Osteoporosis, post-menopausal    DEXA 12/02/11: -3.5 (with lomax gyn), declines bisphos due to bone pain  . PAF (paroxysmal atrial fibrillation) ([place])    One episode in 2008, spontaneously converted in the ED, no further treatment  . Spondylosis    thoracic spine  . Thyroid nodule dx 07/2011   [place] q 19moto follow calcified L thyroid nodule (12/08/11 [place]    Past Surgical History:  Procedure Laterality Date  . ABDOMINAL HYSTERECTOMY    . ABDOMINAL HYSTERECTOMY  yrs ago  . APPENDECTOMY    . APPENDECTOMY  age 83 . BLADDER REPAIR  5 yrs ago  . BREAST LUMPECTOMY    . BREAST SURGERY     biopsy  . BREAST SURGERY   yrs ago   br bx  . BREAST SURGERY  01/ 23/2013   left central lumpectomy and snbx, re-excision lumpectomy  . CARDIOVASCULAR STRESS TEST  03/17/2012  . LEFT HEART CATHETERIZATION WITH CORONARY ANGIOGRAM N/A 03/18/2012   Procedure: LEFT HEART CATHETERIZATION WITH CORONARY ANGIOGRAM;  Surgeon: JMinus Breeding MD;  [facility];  Service: Cardiovascular;  Laterality: N/A;  . TONSILLECTOMY   age 83   Social History   Social History  . Marital status: Married    Spouse name: N/A  . Number of children: N/A  . Years of education: N/A   Social History Main Topics  . Smoking status: Former Smoker    Packs/day: 1.00     Years: 10.00    Types: Cigarettes    Quit date: 08/27/1977  . Smokeless tobacco: Never Used  . Alcohol use Yes     Comment: rare  . Drug use: No  . Sexual activity: Yes    Birth control/ protection: Post-menopausal   Other Topics Concern  .  None   Social History Narrative   ** Merged History Encounter **        Family History  Problem Relation Age of Onset  . Cancer Sister        unknown  . Hypertension Mother   . Stroke Mother   . Arthritis Father   . Heart disease Father   . Diabetes Son     Review of Systems  Constitutional: Positive for fatigue. Negative for appetite change, chills and fever.  Gastrointestinal: Positive for abdominal distention and abdominal pain. Negative for blood in stool, constipation and diarrhea (loose stools with imodium).  Genitourinary: Negative for dysuria, frequency and hematuria.       Objective:   Vitals:   01/25/17 1429  BP: (!) 156/76  Pulse: 91  Resp: 16  Temp: 97.9 F (36.6 C)   Filed Weights   01/25/17 1429  Weight: 140 lb (63.5 kg)   Body mass index is 21.93 kg/m.  Wt Readings from Last 3 Encounters:  01/25/17 140 lb (63.5 kg)  12/05/16 138 lb (62.6 kg)  11/27/16 140 lb (63.5 kg)     Physical Exam Constitutional: Appears well-developed and well-nourished. No distress.  HENT:  Head: Normocephalic and atraumatic.  Abdomen: soft, non distended, tenderness in LUQ and across lower abdomen, no rebound or guarding, no masses Skin: Skin is warm and dry. Not diaphoretic.  Psychiatric: Normal mood and affect. Behavior is normal.         Assessment & Plan:   See Problem List for Assessment and Plan of chronic medical problems.

## 2017-01-25 ENCOUNTER — Ambulatory Visit (INDEPENDENT_AMBULATORY_CARE_PROVIDER_SITE_OTHER): Payer: Medicare Other | Admitting: Internal Medicine

## 2017-01-25 ENCOUNTER — Ambulatory Visit (INDEPENDENT_AMBULATORY_CARE_PROVIDER_SITE_OTHER)
Admission: RE | Admit: 2017-01-25 | Discharge: 2017-01-25 | Disposition: A | Discharge status: Home / Self Care | Payer: Medicare Other | Source: Ambulatory Visit | Attending: Internal Medicine | Admitting: Internal Medicine

## 2017-01-25 ENCOUNTER — Encounter: Payer: Self-pay | Admitting: Internal Medicine

## 2017-01-25 VITALS — BP 156/76 | HR 91 | Temp 97.9°F | Resp 16 | Wt 140.0 lb

## 2017-01-25 DIAGNOSIS — R109 Unspecified abdominal pain: Secondary | ICD-10-CM

## 2017-01-25 DIAGNOSIS — K5900 Constipation, unspecified: Secondary | ICD-10-CM | POA: Diagnosis not present

## 2017-01-25 MED ORDER — CHOLESTYRAMINE 4 G PO PACK: 4.0000 g | PACK | Freq: Every day | ORAL | 5 refills | Status: DC

## 2017-01-25 NOTE — Assessment & Plan Note (Signed)
?   Imodium causing constipation and incomplete emptying.  Ct scan < 1 year ago showed constipation Try stopping imodium Taking fiber daily - has increased it without improvement Trial of cholestyramine once daily Ab x-ray today to evaluate for constipation She does follow with GI and hopes she does not have to return to him - he wants her to do biofeedback

## 2017-01-25 NOTE — Patient Instructions (Addendum)
Have an xray today   Stop taking imodium.  Start cholestyramine daily.    Let me know how you are doing in a few days.

## 2017-01-25 NOTE — Assessment & Plan Note (Signed)
Pain in LUQ and across the lower abdomen.  Associated with abdominal bloating and loose stools/constipation Ct scan done less than one year ago - normal except for constipation Will check ab xray for constipation - may need further imaging Stop imodium, trial of cholestyramine and continue fiber She will let me know how she does after a few days

## 2017-03-17 ENCOUNTER — Ambulatory Visit (INDEPENDENT_AMBULATORY_CARE_PROVIDER_SITE_OTHER): Payer: Medicare Other | Admitting: Internal Medicine

## 2017-03-17 ENCOUNTER — Encounter: Payer: Self-pay | Admitting: Internal Medicine

## 2017-03-17 VITALS — BP 142/80 | HR 74 | Temp 97.9°F | Resp 16 | Wt 142.0 lb

## 2017-03-17 DIAGNOSIS — R109 Unspecified abdominal pain: Secondary | ICD-10-CM | POA: Diagnosis not present

## 2017-03-17 DIAGNOSIS — M810 Age-related osteoporosis without current pathological fracture: Secondary | ICD-10-CM | POA: Diagnosis not present

## 2017-03-17 DIAGNOSIS — E039 Hypothyroidism, unspecified: Secondary | ICD-10-CM | POA: Diagnosis not present

## 2017-03-17 DIAGNOSIS — I10 Essential (primary) hypertension: Secondary | ICD-10-CM

## 2017-03-17 DIAGNOSIS — E1043 Type 1 diabetes mellitus with diabetic autonomic (poly)neuropathy: Secondary | ICD-10-CM | POA: Diagnosis not present

## 2017-03-17 DIAGNOSIS — Z0001 Encounter for general adult medical examination with abnormal findings: Secondary | ICD-10-CM

## 2017-03-17 DIAGNOSIS — R197 Diarrhea, unspecified: Secondary | ICD-10-CM

## 2017-03-17 DIAGNOSIS — Z0000 Encounter for general adult medical examination without abnormal findings: Secondary | ICD-10-CM

## 2017-03-17 MED ORDER — RANITIDINE HCL 150 MG PO TABS: 150.0000 mg | ORAL_TABLET | Freq: Every day | ORAL | 1 refills | Status: DC

## 2017-03-17 MED ORDER — ALPRAZOLAM 0.5 MG PO TABS: 0.2500 mg | ORAL_TABLET | Freq: Two times a day (BID) | ORAL | 5 refills | Status: DC | PRN

## 2017-03-17 NOTE — Progress Notes (Deleted)
Subjective:   [name] is a 83 y.o. female who presents for Medicare Annual (Subsequent) preventive examination.  Review of Systems:  No ROS.  Medicare Wellness Visit. Additional risk factors are reflected in the social history.    Sleep patterns: feels rested on waking, gets up 1 times nightly to void and sleeps 7-8 hours nightly.    Home Safety/Smoke Alarms: Feels safe in home. Smoke alarms in place.  Living environment; residence and Firearm Safety: 1-story house/ trailer, no firearms. Lives with husband, no needs for DME, good support system Seat Belt Safety/Bike Helmet: Wears seat belt.   Counseling:   Eye Exam- appointment yearly, Dr. Sabra Heck Dental- appointment every 6 months Dr. Mariel Sleet  Female:   Pap- N/A      Mammo- Last 01/11/17, BI-RADS category 2: benign     Dexa scan- Last 12/02/11, osteoporosis       CCS- N/A     Objective:     Vitals: There were no vitals taken for this visit.  There is no height or weight on file to calculate BMI.   Tobacco History  Smoking Status  . Former Smoker  . Packs/day: 1.00  . Years: 10.00  . Types: Cigarettes  . Quit date: 08/27/1977  Smokeless Tobacco  . Never Used     Counseling given: Not Answered   Past Medical History:  Diagnosis Date  . Arthritis    fingers  . Cancer of upper-outer quadrant of female breast ([place]) 08/21/2011   ER +  PR +  Her 2 -  Ki67 9%   0.9 cm invasive lobular  Carcinoma ,s/p central lumpectomy with sentinel node biopsy,  ER/PR positive s/p re-excion on 09/16/11 with final pathology showing atypical hyperplasia   . CAP (community acquired pneumonia) 12/06/2016  . Diabetes mellitus    type wears insulin pump  . Diabetes mellitus type 1 (HCC)    type I- wears insulin pump  . GERD (gastroesophageal reflux disease)   . Hypercholesteremia   . Hypertension   . Hyponatremia   . Hypothyroid   . Hypothyroidism   . Osteoarthritis   . Osteoporosis, post-menopausal    DEXA 12/02/11: -3.5 (with  lomax gyn), declines bisphos due to bone pain  . PAF (paroxysmal atrial fibrillation) ([place])    One episode in 2008, spontaneously converted in the ED, no further treatment  . Spondylosis    thoracic spine  . Thyroid nodule dx 07/2011   [place] q 39moto follow calcified L thyroid nodule (12/08/11 [place]   Past Surgical History:  Procedure Laterality Date  . ABDOMINAL HYSTERECTOMY    . ABDOMINAL HYSTERECTOMY  yrs ago  . APPENDECTOMY    . APPENDECTOMY  age 83 . BLADDER REPAIR  5 yrs ago  . BREAST LUMPECTOMY    . BREAST SURGERY     biopsy  . BREAST SURGERY   yrs ago   br bx  . BREAST SURGERY  01/ 23/2013   left central lumpectomy and snbx, re-excision lumpectomy  . CARDIOVASCULAR STRESS TEST  03/17/2012  . LEFT HEART CATHETERIZATION WITH CORONARY ANGIOGRAM N/A 03/18/2012   Procedure: LEFT HEART CATHETERIZATION WITH CORONARY ANGIOGRAM;  Surgeon: JMinus Breeding MD;  [facility];  Service: Cardiovascular;  Laterality: N/A;  . TONSILLECTOMY   age 83  Family History  Problem Relation Age of Onset  . Cancer Sister        unknown  . Hypertension Mother   . Stroke Mother   . Arthritis  Father   . Heart disease Father   . Diabetes Son    History  Sexual Activity  . Sexual activity: Yes  . Birth control/ protection: Post-menopausal    Outpatient Encounter Prescriptions as of 03/17/2017  Medication Sig  . albuterol (PROVENTIL HFA;VENTOLIN HFA) 108 (90 Base) MCG/ACT inhaler Inhale 2 puffs into the lungs every 6 (six) hours as needed for wheezing or shortness of breath.  . ALPRAZolam (XANAX) 0.5 MG tablet Take 0.5 tablets (0.25 mg total) by mouth 2 (two) times daily as needed for anxiety or sleep.  [name] aspirin 81 MG tablet Take 81 mg by mouth daily.  . B Complex-C (SUPER B COMPLEX PO) Take 1 tablet by mouth daily.  . BD INSULIN SYRINGE ULTRAFINE 31G X 15/64" 0.3 ML MISC USE WITH INJECTIONS  . BD PEN NEEDLE NANO U/F 32G X 4 MM MISC   . cholecalciferol (VITAMIN D) 1000 UNITS tablet Take  5,000 Units by mouth daily.   . cholestyramine (QUESTRAN) 4 g packet Take 1 packet (4 g total) by mouth daily.  . Cyanocobalamin (VITAMIN B-12 PO) Take 1,000 mg by mouth daily.  . fish oil-omega-3 fatty acids 1000 MG capsule Take 2 g by mouth daily.  [name] glucose blood (ONE TOUCH ULTRA TEST) test strip CHECK BLOOD SUGARS 8 TIMES DAILY  . hydrochlorothiazide (MICROZIDE) 12.5 MG capsule Take 12.5 mg by mouth every other day.   . Insulin Glargine (LANTUS SOLOSTAR) 100 UNIT/ML Solostar Pen Inject 16 Units into the skin daily at 10 pm.   . insulin lispro (HUMALOG KWIKPEN) 100 UNIT/ML KiwkPen Inject into the skin. Sliding scale before meal  . insulin lispro (HUMALOG) 100 UNIT/ML KiwkPen Inject into the skin.  [name] levothyroxine (SYNTHROID, LEVOTHROID) 112 MCG tablet TAKE 1 TABLET ONCE DAILY.  [name] losartan (COZAAR) 100 MG tablet Take 1 tablet (100 mg total) by mouth daily.  . metoprolol (LOPRESSOR) 50 MG tablet Take 1/2 tablet by mouth daily as needed for rapid palpitations.  May repeat one in 1 hour if palpitations continue.  . Multiple Vitamin (MULTIVITAMIN) tablet Take 1 tablet by mouth daily. For breast and bone health  . simvastatin (ZOCOR) 20 MG tablet Take 20 mg by mouth at bedtime.   . vitamin C (ASCORBIC ACID) 500 MG tablet Take 1,000 mg by mouth daily.  . vitamin E 400 UNIT capsule Take 400 Units by mouth daily.   No facility-administered encounter medications on file as of 03/17/2017.     Activities of Daily Living No flowsheet data found.  Patient Care Team: Pincus Sanes, MD as PCP - General (Internal Medicine) Emelia Loron, MD (General Surgery) Frances Nickels, MD (Endocrinology) Blima Ledger, OD (Optometry)    Assessment:    Physical assessment deferred to PCP.  Exercise Activities and Dietary recommendations   Diet (meal preparation, eat out, water intake, caffeinated beverages, dairy products, fruits and vegetables): in general, a "healthy" diet  , well balanced, eats a  variety of fruits and vegetables daily, limits salt, fat/cholesterol, sugar, caffeine, drinks 6-8 glasses of water daily.   Goals    None     Fall Risk Fall Risk  03/10/2016 02/18/2015 08/20/2014 08/01/2014  Falls in the past year? No No Yes Yes  Number falls in past yr: - - - 1  Injury with Fall? - - - Yes  Risk for fall due to : - History of fall(s) - -   Depression Screen PHQ 2/9 Scores 03/10/2016 02/18/2015 08/01/2014  PHQ - 2 Score 0 0 0  Cognitive Function       Ad8 score reviewed for issues:  Issues making decisions: no  Less interest in hobbies / activities: no  Repeats questions, stories (family complaining): no  Trouble using ordinary gadgets (microwave, computer, phone):no  Forgets the month or year: no  Mismanaging finances: no  Remembering appts: no  Daily problems with thinking and/or memory: no Ad8 score is= 0    Immunization History  Administered Date(s) Administered  . Influenza Split 05/21/2012  . Influenza, High Dose Seasonal PF 05/24/2013  . Influenza,inj,Quad PF,36+ Mos 05/24/2014  . Influenza-Unspecified 05/11/2015, 05/29/2016  . Pneumococcal Conjugate-13 02/18/2015  . Pneumococcal Polysaccharide-23 08/10/2009   Screening Tests Health Maintenance  Topic Date Due  . TETANUS/TDAP  06/08/1948  . OPHTHALMOLOGY EXAM  11/29/2015  . DEXA SCAN  01/24/2016  . INFLUENZA VACCINE  03/10/2017  . FOOT EXAM  09/17/2017  . PNA vac Low Risk Adult  Completed      Plan:     I have personally reviewed and noted the following in the patient's chart:   . Medical and social history . Use of alcohol, tobacco or illicit drugs  . Current medications and supplements . Functional ability and status . Nutritional status . Physical activity . Advanced directives . List of other physicians . Vitals . Screenings to include cognitive, depression, and falls . Referrals and appointments  In addition, I have reviewed and discussed with patient certain  preventive protocols, quality metrics, and best practice recommendations. A written personalized care plan for preventive services as well as general preventive health recommendations were provided to patient.     Michiel Cowboy, RN  03/17/2017   Medical screening examination/treatment/procedure(s) were performed by non-physician practitioner and as supervising physician I was immediately available for consultation/collaboration. I agree with above. Binnie Rail, MD

## 2017-03-17 NOTE — Patient Instructions (Addendum)
       Start zantac at bedtime until you see Gi at [facility].   Test(s) ordered today. Your results will be released to [place] (or called to you) after review, usually within 72hours after test completion. If any changes need to be made, you will be notified at that same time.  All other Health Maintenance issues reviewed.   All recommended immunizations and age-appropriate screenings are up-to-date or discussed.  No immunizations administered today.    Your prescription(s) have been submitted to your pharmacy. Please take as directed and contact our office if you believe you are having problem(s) with the medication(s).   Please followup in 6 months

## 2017-03-17 NOTE — Progress Notes (Addendum)
Subjective:   [name] is a 83 y.o. female who presents for Medicare Annual (Subsequent) preventive examination.  Review of Systems:  No ROS.  Medicare Wellness Visit. Additional risk factors are reflected in the social history.    Sleep patterns: feels rested on waking, gets up 1 times nightly to void and sleeps 8-9 hours nightly.   Home Safety/Smoke Alarms: Feels safe in home. Smoke alarms in place.  Living environment; residence and Firearm Safety: 2-story house, no firearms Lives with husband, no needs for DME, good support system  Seat Belt Safety/Bike Helmet: Wears seat belt.   Counseling:   Eye Exam- appointment yearly Dental- appointment every 6 months   Female:   Pap- N/A      Mammo- Last 01/11/17, BI-RADS category 2: benign     Dexa scan- Last 12/02/11, osteoporosis, referral placed today by PCP       CCS- N/A     Objective:     Vitals: There were no vitals taken for this visit.  There is no height or weight on file to calculate BMI.   Tobacco History  Smoking Status  . Former Smoker  . Packs/day: 1.00  . Years: 10.00  . Types: Cigarettes  . Quit date: 08/27/1977  Smokeless Tobacco  . Never Used     Counseling given: Not Answered   Past Medical History:  Diagnosis Date  . Arthritis    fingers  . Cancer of upper-outer quadrant of female breast ([place]) 08/21/2011   ER +  PR +  Her 2 -  Ki67 9%   0.9 cm invasive lobular  Carcinoma ,s/p central lumpectomy with sentinel node biopsy,  ER/PR positive s/p re-excion on 09/16/11 with final pathology showing atypical hyperplasia   . CAP (community acquired pneumonia) 12/06/2016  . Diabetes mellitus    type wears insulin pump  . Diabetes mellitus type 1 (HCC)    type I- wears insulin pump  . GERD (gastroesophageal reflux disease)   . Hypercholesteremia   . Hypertension   . Hyponatremia   . Hypothyroid   . Hypothyroidism   . Osteoarthritis   . Osteoporosis, post-menopausal    DEXA 12/02/11: -3.5 (with  lomax gyn), declines bisphos due to bone pain  . PAF (paroxysmal atrial fibrillation) ([place])    One episode in 2008, spontaneously converted in the ED, no further treatment  . Spondylosis    thoracic spine  . Thyroid nodule dx 07/2011   [place] q 76moto follow calcified L thyroid nodule (12/08/11 [place]   Past Surgical History:  Procedure Laterality Date  . ABDOMINAL HYSTERECTOMY    . ABDOMINAL HYSTERECTOMY  yrs ago  . APPENDECTOMY    . APPENDECTOMY  age [age] . BLADDER REPAIR  5 yrs ago  . BREAST LUMPECTOMY    . BREAST SURGERY     biopsy  . BREAST SURGERY   yrs ago   br bx  . BREAST SURGERY  01/ 23/2013   left central lumpectomy and snbx, re-excision lumpectomy  . CARDIOVASCULAR STRESS TEST  03/17/2012  . LEFT HEART CATHETERIZATION WITH CORONARY ANGIOGRAM N/A 03/18/2012   Procedure: LEFT HEART CATHETERIZATION WITH CORONARY ANGIOGRAM;  Surgeon: JMinus Breeding MD;  [facility];  Service: Cardiovascular;  Laterality: N/A;  . TONSILLECTOMY   age 83  Family History  Problem Relation Age of Onset  . Cancer Sister        unknown  . Hypertension Mother   . Stroke Mother   .  Arthritis Father   . Heart disease Father   . Diabetes Son    History  Sexual Activity  . Sexual activity: Yes  . Birth control/ protection: Post-menopausal    Outpatient Encounter Prescriptions as of 03/17/2017  Medication Sig  . albuterol (PROVENTIL HFA;VENTOLIN HFA) 108 (90 Base) MCG/ACT inhaler Inhale 2 puffs into the lungs every 6 (six) hours as needed for wheezing or shortness of breath.  . ALPRAZolam (XANAX) 0.5 MG tablet Take 0.5 tablets (0.25 mg total) by mouth 2 (two) times daily as needed for anxiety or sleep.  [name] aspirin 81 MG tablet Take 81 mg by mouth daily.  . B Complex-C (SUPER B COMPLEX PO) Take 1 tablet by mouth daily.  . BD INSULIN SYRINGE ULTRAFINE 31G X 15/64" 0.3 ML MISC USE WITH INJECTIONS  . BD PEN NEEDLE NANO U/F 32G X 4 MM MISC   . cholecalciferol (VITAMIN D) 1000 UNITS tablet Take  5,000 Units by mouth daily.   . cholestyramine (QUESTRAN) 4 g packet Take 1 packet (4 g total) by mouth daily.  . Cyanocobalamin (VITAMIN B-12 PO) Take 1,000 mg by mouth daily.  . fish oil-omega-3 fatty acids 1000 MG capsule Take 2 g by mouth daily.  [name] glucose blood (ONE TOUCH ULTRA TEST) test strip CHECK BLOOD SUGARS 8 TIMES DAILY  . hydrochlorothiazide (MICROZIDE) 12.5 MG capsule Take 12.5 mg by mouth every other day.   . Insulin Glargine (LANTUS SOLOSTAR) 100 UNIT/ML Solostar Pen Inject 16 Units into the skin daily at 10 pm.   . insulin lispro (HUMALOG KWIKPEN) 100 UNIT/ML KiwkPen Inject into the skin. Sliding scale before meal  . insulin lispro (HUMALOG) 100 UNIT/ML KiwkPen Inject into the skin.  [name] levothyroxine (SYNTHROID, LEVOTHROID) 112 MCG tablet TAKE 1 TABLET ONCE DAILY.  [name] losartan (COZAAR) 100 MG tablet Take 1 tablet (100 mg total) by mouth daily.  . metoprolol (LOPRESSOR) 50 MG tablet Take 1/2 tablet by mouth daily as needed for rapid palpitations.  May repeat one in 1 hour if palpitations continue.  . Multiple Vitamin (MULTIVITAMIN) tablet Take 1 tablet by mouth daily. For breast and bone health  . simvastatin (ZOCOR) 20 MG tablet Take 20 mg by mouth at bedtime.   . vitamin C (ASCORBIC ACID) 500 MG tablet Take 1,000 mg by mouth daily.  . vitamin E 400 UNIT capsule Take 400 Units by mouth daily.   No facility-administered encounter medications on file as of 03/17/2017.     Activities of Daily Living No flowsheet data found.  Patient Care Team: Binnie Rail, MD as PCP - General (Internal Medicine) Rolm Bookbinder, MD (General Surgery) Lonna Duval, MD (Endocrinology) Marica Otter, OD (Optometry)    Assessment:    Physical assessment deferred to PCP.  Exercise Activities and Dietary recommendations   Diet (meal preparation, eat out, water intake, caffeinated beverages, dairy products, fruits and vegetables): in general, a "healthy" diet  , well balanced, eats a  variety of fruits and vegetables daily, limits salt, fat/cholesterol, sugar, caffeine, drinks 6-8 glasses of water daily.   Goals    None     Fall Risk Fall Risk  03/10/2016 02/18/2015 08/20/2014 08/01/2014  Falls in the past year? No No Yes Yes  Number falls in past yr: - - - 1  Injury with Fall? - - - Yes  Risk for fall due to : - History of fall(s) - -   Depression Screen PHQ 2/9 Scores 03/10/2016 02/18/2015 08/01/2014  PHQ - 2 Score 0 0  0     Cognitive Function       Ad8 score reviewed for issues:  Issues making decisions: no  Less interest in hobbies / activities: no  Repeats questions, stories (family complaining): no  Trouble using ordinary gadgets (microwave, computer, phone):no  Forgets the month or year: no  Mismanaging finances: no  Remembering appts: no  Daily problems with thinking and/or memory: no Ad8 score is= 0  Immunization History  Administered Date(s) Administered  . Influenza Split 05/21/2012  . Influenza, High Dose Seasonal PF 05/24/2013  . Influenza,inj,Quad PF,36+ Mos 05/24/2014  . Influenza-Unspecified 05/11/2015, 05/29/2016  . Pneumococcal Conjugate-13 02/18/2015  . Pneumococcal Polysaccharide-23 08/10/2009   Screening Tests Health Maintenance  Topic Date Due  . TETANUS/TDAP  06/08/1948  . OPHTHALMOLOGY EXAM  11/29/2015  . DEXA SCAN  01/24/2016  . INFLUENZA VACCINE  03/10/2017  . FOOT EXAM  09/17/2017  . PNA vac Low Risk Adult  Completed      Plan:     I have personally reviewed and noted the following in the patient's chart:   . Medical and social history . Use of alcohol, tobacco or illicit drugs  . Current medications and supplements . Functional ability and status . Nutritional status . Physical activity . Advanced directives . List of other physicians . Vitals . Screenings to include cognitive, depression, and falls . Referrals and appointments  In addition, I have reviewed and discussed with patient certain  preventive protocols, quality metrics, and best practice recommendations. A written personalized care plan for preventive services as well as general preventive health recommendations were provided to patient.     Michiel Cowboy, RN  03/17/2017   Medical screening examination/treatment/procedure(s) were performed by non-physician practitioner and as supervising physician I was immediately available for consultation/collaboration. I agree with above. Binnie Rail, MD

## 2017-03-17 NOTE — Assessment & Plan Note (Signed)
Not taking calcium - advised taking 600 mg daily Continue vitamin D dexa due - will order Continue regular exercise

## 2017-03-17 NOTE — Progress Notes (Signed)
  Subjective:    Patient ID: [name], female    DOB: 11/07/1928, 83 y.o.   MRN: [id number]  HPI She is here for a physical exam.   Chronic abdominal pain:  She has cramping, especially in the morning.  The cramping is in the upper abdomen.  She goes to the bathroom first thing in the morning and it is watery.   If she tries to sleep on either side she has pain in her LUQ and upper abdomen and it makes her wants to go to the bathroom.  She burps when she eats sometimes.  The pain goes away after the morning and she is pain free during the day.  She does feel bloated.  She takes imodium as needed - every third day approximately.  She has tried hyoscyamine and dicyclomine and they did not help.  She is following with GI and work up including colonoscopy and CT scan did not show anything concerning.  She denies GERD.   Medications and allergies reviewed with patient and updated if appropriate.  Patient Active Problem List   Diagnosis Date Noted  . Constipation 01/25/2017  . CAP (community acquired pneumonia) 12/06/2016  . Closed fracture of proximal end of right humerus 07/09/2016  . Cough 06/17/2016  . Rib pain on left side 06/17/2016  . AP (abdominal pain) 03/10/2016  . Hearing loss 01/03/2016  . Allergic rhinitis 01/03/2016  . Minimal Coronary Plaque by Cardiac Cath in 2013 09/11/2015  . Palpitations 09/06/2015  . Frequent loose stools 08/28/2015  . Hyponatremia   . Acute mesenteric ischemia (HCC)   . Hypothyroidism 07/11/2012  . Precordial pain 03/18/2012  . Syncope 03/18/2012  . Abnormal LFTs   . Hypercholesteremia   . Hypertension   . Diabetes mellitus type 1 (HCC)   . Osteoarthritis   . Thyroid nodule   . Osteoporosis, post-menopausal   . Primary cancer of upper outer quadrant of left female breast (HCC) 08/21/2011    Current Outpatient Prescriptions on File Prior to Visit  Medication Sig Dispense Refill  . aspirin 81 MG tablet Take 81 mg by mouth daily.    . B  Complex-C (SUPER B COMPLEX PO) Take 1 tablet by mouth daily.    . BD INSULIN SYRINGE ULTRAFINE 31G X 15/64" 0.3 ML MISC USE WITH INJECTIONS 100 each 3  . BD PEN NEEDLE NANO U/F 32G X 4 MM MISC   0  . cholecalciferol (VITAMIN D) 1000 UNITS tablet Take 5,000 Units by mouth daily.     . Cyanocobalamin (VITAMIN B-12 PO) Take 1,000 mg by mouth daily.    . fish oil-omega-3 fatty acids 1000 MG capsule Take 2 g by mouth daily.    . glucose blood (ONE TOUCH ULTRA TEST) test strip CHECK BLOOD SUGARS 8 TIMES DAILY    . hydrochlorothiazide (MICROZIDE) 12.5 MG capsule Take 12.5 mg by mouth every other day.     . Insulin Glargine (LANTUS SOLOSTAR) 100 UNIT/ML Solostar Pen Inject 16 Units into the skin daily at 10 pm.     . insulin lispro (HUMALOG KWIKPEN) 100 UNIT/ML KiwkPen Inject into the skin. Sliding scale before meal    . levothyroxine (SYNTHROID, LEVOTHROID) 112 MCG tablet TAKE 1 TABLET ONCE DAILY. 90 tablet 1  . losartan (COZAAR) 100 MG tablet Take 1 tablet (100 mg total) by mouth daily. 90 tablet 3  . metoprolol (LOPRESSOR) 50 MG tablet Take 1/2 tablet by mouth daily as needed for rapid palpitations.  May repeat one   in 1 hour if palpitations continue.    . Multiple Vitamin (MULTIVITAMIN) tablet Take 1 tablet by mouth daily. For breast and bone health    . simvastatin (ZOCOR) 20 MG tablet Take 20 mg by mouth at bedtime.     . vitamin C (ASCORBIC ACID) 500 MG tablet Take 1,000 mg by mouth daily.    . vitamin E 400 UNIT capsule Take 400 Units by mouth daily.     No current facility-administered medications on file prior to visit.     Past Medical History:  Diagnosis Date  . Arthritis    fingers  . Cancer of upper-outer quadrant of female breast (HCC) 08/21/2011   ER +  PR +  Her 2 -  Ki67 9%   0.9 cm invasive lobular  Carcinoma ,s/p central lumpectomy with sentinel node biopsy,  ER/PR positive s/p re-excion on 09/16/11 with final pathology showing atypical hyperplasia   . CAP (community acquired  pneumonia) 12/06/2016  . Diabetes mellitus    type wears insulin pump  . Diabetes mellitus type 1 (HCC)    type I- wears insulin pump  . GERD (gastroesophageal reflux disease)   . Hypercholesteremia   . Hypertension   . Hyponatremia   . Hypothyroid   . Hypothyroidism   . Osteoarthritis   . Osteoporosis, post-menopausal    DEXA 12/02/11: -3.5 (with lomax gyn), declines bisphos due to bone pain  . PAF (paroxysmal atrial fibrillation) (HCC)    One episode in 2008, spontaneously converted in the ED, no further treatment  . Spondylosis    thoracic spine  . Thyroid nodule dx 07/2011   US q 6mo to follow calcified L thyroid nodule (12/08/11 US)    Past Surgical History:  Procedure Laterality Date  . ABDOMINAL HYSTERECTOMY    . ABDOMINAL HYSTERECTOMY  yrs ago  . APPENDECTOMY    . APPENDECTOMY  age 15  . BLADDER REPAIR  5 yrs ago  . BREAST LUMPECTOMY    . BREAST SURGERY     biopsy  . BREAST SURGERY   yrs ago   br bx  . BREAST SURGERY  01/ 23/2013   left central lumpectomy and snbx, re-excision lumpectomy  . CARDIOVASCULAR STRESS TEST  03/17/2012  . LEFT HEART CATHETERIZATION WITH CORONARY ANGIOGRAM N/A 03/18/2012   Procedure: LEFT HEART CATHETERIZATION WITH CORONARY ANGIOGRAM;  Surgeon: James Hochrein, MD;  Location: [facility] CATH LAB;  Service: Cardiovascular;  Laterality: N/A;  . TONSILLECTOMY   age 23    Social History   Social History  . Marital status: Married    Spouse name: N/A  . Number of children: N/A  . Years of education: N/A   Social History Main Topics  . Smoking status: Former Smoker    Packs/day: 1.00    Years: 10.00    Types: Cigarettes    Quit date: 08/27/1977  . Smokeless tobacco: Never Used  . Alcohol use Yes     Comment: rare  . Drug use: No  . Sexual activity: Yes    Birth control/ protection: Post-menopausal   Other Topics Concern  . Not on file   Social History Narrative   ** Merged History Encounter **        Family History  Problem Relation  Age of Onset  . Cancer Sister        unknown  . Hypertension Mother   . Stroke Mother   . Arthritis Father   . Heart disease Father   . Diabetes Son       Review of Systems  Constitutional: Negative for chills and fever.  Eyes: Negative for visual disturbance.  Respiratory: Positive for cough (every morning). Negative for shortness of breath and wheezing.   Cardiovascular: Positive for palpitations. Negative for chest pain and leg swelling.  Gastrointestinal: Positive for abdominal distention, abdominal pain, diarrhea and nausea. Negative for blood in stool.       No GERD  Endocrine: Positive for cold intolerance.  Genitourinary: Positive for difficulty urinating (has to strain to urinate). Negative for dysuria.  Musculoskeletal: Positive for arthralgias. Negative for back pain.  Skin: Negative for color change and rash.  Neurological: Positive for headaches. Negative for dizziness and light-headedness.  Psychiatric/Behavioral: Negative for dysphoric mood. The patient is not nervous/anxious.        Objective:   Vitals:   03/17/17 1337  BP: (!) 142/80  Pulse: 74  Resp: 16  Temp: 97.9 F (36.6 C)   Filed Weights   03/17/17 1337  Weight: 142 lb (64.4 kg)   Body mass index is 22.24 kg/m.  Wt Readings from Last 3 Encounters:  03/17/17 142 lb (64.4 kg)  01/25/17 140 lb (63.5 kg)  12/05/16 138 lb (62.6 kg)     Physical Exam Constitutional: She appears well-developed and well-nourished. No distress.  HENT:  Head: Normocephalic and atraumatic.  Right Ear: External ear normal. Normal ear canal and TM Left Ear: External ear normal.  Normal ear canal and TM Mouth/Throat: Oropharynx is clear and moist.  Eyes: Conjunctivae and EOM are normal.  Neck: Neck supple. No tracheal deviation present. No thyromegaly present.  No carotid bruit  Cardiovascular: Normal rate, regular rhythm and normal heart sounds.   No murmur heard.  No edema. Pulmonary/Chest: Effort normal and  breath sounds normal. No respiratory distress. She has no wheezes. She has no rales.  Breast: deferred  Abdominal: Soft. Mild tenderness in upper abdomen more on left side.  She exhibits no distension. There is no tenderness.  Lymphadenopathy: She has no cervical adenopathy.  Skin: Skin is warm and dry. She is not diaphoretic.  Psychiatric: She has a normal mood and affect. Her behavior is normal.       Assessment & Plan:   Physical exam: Screening blood work  Ordered  Immunizations - tetanus due - will check with insurance, discussed shingles vaccine   Colonoscopy -  no longer due to age  20 -  No longer needed due to age Dexa - will order Eye exams  Up to date  - Dr Marica Otter Exercise - rides on stationary bike 30 minutes, some walking Weight  Normal BMI Skin  Sees derm, has rashes Substance abuse  none  See Problem List for Assessment and Plan of chronic medical problems.

## 2017-03-17 NOTE — Assessment & Plan Note (Signed)
bp controlled at home, elevated here - mildly Continue current medications cmp

## 2017-03-17 NOTE — Assessment & Plan Note (Signed)
Following with endocrine a1c has always been less than 7 Management per endo Foot exam and eye exam up to date

## 2017-03-17 NOTE — Assessment & Plan Note (Signed)
Still having loose stools and abdominal cramping/pain/bloating GI work up negative to date Antispasmodics have not helped Will refer to GI at [facility] Will try zantac to see if she has some atypical GERD

## 2017-03-17 NOTE — Progress Notes (Signed)
Pre visit review using our clinic review tool, if applicable. No additional management support is needed unless otherwise documented below in the visit note. 

## 2017-03-17 NOTE — Assessment & Plan Note (Signed)
Check tsh  Titrate med dose if needed  

## 2017-03-18 ENCOUNTER — Ambulatory Visit (INDEPENDENT_AMBULATORY_CARE_PROVIDER_SITE_OTHER)
Admission: RE | Admit: 2017-03-18 | Discharge: 2017-03-18 | Disposition: A | Discharge status: Home / Self Care | Payer: Medicare Other | Source: Ambulatory Visit | Attending: Internal Medicine | Admitting: Internal Medicine

## 2017-03-18 ENCOUNTER — Other Ambulatory Visit: Payer: Medicare Other

## 2017-03-18 DIAGNOSIS — M810 Age-related osteoporosis without current pathological fracture: Secondary | ICD-10-CM

## 2017-03-18 DIAGNOSIS — E1043 Type 1 diabetes mellitus with diabetic autonomic (poly)neuropathy: Secondary | ICD-10-CM

## 2017-03-18 DIAGNOSIS — Z0000 Encounter for general adult medical examination without abnormal findings: Secondary | ICD-10-CM

## 2017-03-18 DIAGNOSIS — E039 Hypothyroidism, unspecified: Secondary | ICD-10-CM

## 2017-03-18 DIAGNOSIS — I10 Essential (primary) hypertension: Secondary | ICD-10-CM

## 2017-03-22 ENCOUNTER — Other Ambulatory Visit (INDEPENDENT_AMBULATORY_CARE_PROVIDER_SITE_OTHER): Payer: Medicare Other

## 2017-03-22 DIAGNOSIS — I10 Essential (primary) hypertension: Secondary | ICD-10-CM | POA: Diagnosis not present

## 2017-03-22 DIAGNOSIS — M810 Age-related osteoporosis without current pathological fracture: Secondary | ICD-10-CM | POA: Diagnosis not present

## 2017-03-22 DIAGNOSIS — E1043 Type 1 diabetes mellitus with diabetic autonomic (poly)neuropathy: Secondary | ICD-10-CM | POA: Diagnosis not present

## 2017-03-22 DIAGNOSIS — Z0000 Encounter for general adult medical examination without abnormal findings: Secondary | ICD-10-CM | POA: Diagnosis not present

## 2017-03-22 DIAGNOSIS — E039 Hypothyroidism, unspecified: Secondary | ICD-10-CM

## 2017-03-22 LAB — LIPID PANEL
CHOL/HDL RATIO: 4
Cholesterol: 176 mg/dL (ref 0–200)
HDL: 41.8 mg/dL (ref >39.00)
LDL Cholesterol: 114 mg/dL — ABNORMAL HIGH (ref 0–99)
NONHDL: 133.91
Triglycerides: 102 mg/dL (ref 0.0–149.0)
VLDL: 20.4 mg/dL (ref 0.0–40.0)

## 2017-03-22 LAB — CBC WITH DIFFERENTIAL/PLATELET
BASOS ABS: 0 10*3/uL (ref 0.0–0.1)
Basophils Relative: 0.5 % (ref 0.0–3.0)
Eosinophils Absolute: 0.2 10*3/uL (ref 0.0–0.7)
Eosinophils Relative: 2.8 % (ref 0.0–5.0)
HCT: 38.7 % (ref 36.0–46.0)
HEMOGLOBIN: 13 g/dL (ref 12.0–15.0)
LYMPHS ABS: 2.4 10*3/uL (ref 0.7–4.0)
Lymphocytes Relative: 32.6 % (ref 12.0–46.0)
MCHC: 33.6 g/dL (ref 30.0–36.0)
MCV: 90.9 fl (ref 78.0–100.0)
MONO ABS: 1.1 10*3/uL — AB (ref 0.1–1.0)
MONOS PCT: 14.5 % — AB (ref 3.0–12.0)
NEUTROS PCT: 49.6 % (ref 43.0–77.0)
Neutro Abs: 3.7 10*3/uL (ref 1.4–7.7)
Platelets: 232 10*3/uL (ref 150.0–400.0)
RBC: 4.25 Mil/uL (ref 3.87–5.11)
RDW: 12.9 % (ref 11.5–15.5)
WBC: 7.5 10*3/uL (ref 4.0–10.5)

## 2017-03-22 LAB — COMPREHENSIVE METABOLIC PANEL
ALBUMIN: 4.3 g/dL (ref 3.5–5.2)
ALK PHOS: 75 U/L (ref 39–117)
ALT: 12 U/L (ref 0–35)
AST: 22 U/L (ref 0–37)
BILIRUBIN TOTAL: 0.7 mg/dL (ref 0.2–1.2)
BUN: 9 mg/dL (ref 6–23)
CO2: 28 mEq/L (ref 19–32)
Calcium: 9.8 mg/dL (ref 8.4–10.5)
Chloride: 100 mEq/L (ref 96–112)
Creatinine, Ser: 0.7 mg/dL (ref 0.40–1.20)
GFR: 84.37 mL/min (ref >60.00)
GLUCOSE: 122 mg/dL — AB (ref 70–99)
Potassium: 4.2 mEq/L (ref 3.5–5.1)
SODIUM: 136 meq/L (ref 135–145)
TOTAL PROTEIN: 7.1 g/dL (ref 6.0–8.3)

## 2017-03-22 LAB — VITAMIN D 25 HYDROXY (VIT D DEFICIENCY, FRACTURES): VITD: 39.54 ng/mL (ref 30.00–100.00)

## 2017-03-22 LAB — HEMOGLOBIN A1C: HEMOGLOBIN A1C: 6.8 % — AB (ref 4.6–6.5)

## 2017-03-22 LAB — TSH: TSH: 2.03 u[IU]/mL (ref 0.35–4.50)

## 2017-03-23 ENCOUNTER — Encounter: Payer: Self-pay | Admitting: Internal Medicine

## 2017-03-23 ENCOUNTER — Telehealth: Payer: Self-pay

## 2017-03-23 NOTE — Telephone Encounter (Signed)
Spoke with patient and she is aware of her new appt due to pal day  [name]

## 2017-05-10 ENCOUNTER — Ambulatory Visit (INDEPENDENT_AMBULATORY_CARE_PROVIDER_SITE_OTHER)
Admission: RE | Admit: 2017-05-10 | Discharge: 2017-05-10 | Disposition: A | Discharge status: Home / Self Care | Payer: Medicare Other | Source: Ambulatory Visit | Attending: Internal Medicine | Admitting: Internal Medicine

## 2017-05-10 ENCOUNTER — Ambulatory Visit (INDEPENDENT_AMBULATORY_CARE_PROVIDER_SITE_OTHER): Payer: Medicare Other | Admitting: Internal Medicine

## 2017-05-10 ENCOUNTER — Encounter: Payer: Self-pay | Admitting: Internal Medicine

## 2017-05-10 VITALS — BP 172/98 | HR 74 | Temp 97.8°F | Resp 18 | Wt 138.0 lb

## 2017-05-10 DIAGNOSIS — J189 Pneumonia, unspecified organism: Secondary | ICD-10-CM

## 2017-05-10 DIAGNOSIS — I10 Essential (primary) hypertension: Secondary | ICD-10-CM

## 2017-05-10 MED ORDER — IPRATROPIUM-ALBUTEROL 0.5-2.5 (3) MG/3ML IN SOLN
3.0000 mL | Freq: Once | RESPIRATORY_TRACT | Status: AC
  Administered 2017-05-10: 3 mL via RESPIRATORY_TRACT

## 2017-05-10 MED ORDER — METOPROLOL TARTRATE 50 MG PO TABS: ORAL_TABLET | ORAL | 1 refills | Status: DC

## 2017-05-10 MED ORDER — SPACER/AERO CHAMBER MOUTHPIECE MISC: 0 refills | Status: DC

## 2017-05-10 MED ORDER — ALBUTEROL SULFATE HFA 108 (90 BASE) MCG/ACT IN AERS: 2.0000 | INHALATION_SPRAY | Freq: Four times a day (QID) | RESPIRATORY_TRACT | 0 refills | Status: DC | PRN

## 2017-05-10 MED ORDER — AZITHROMYCIN 250 MG PO TABS: ORAL_TABLET | ORAL | 0 refills | Status: DC

## 2017-05-10 NOTE — Assessment & Plan Note (Signed)
She was diagnosed with pneumonia at urgent care about 10 days ago and completed a Z-Pak without any improvement Still having symptoms of pneumonia Check chest x-ray today She has been intolerant of several antibiotics and is concerned about trying something that may cause stomach upset, therefore we will retry a Z-Pak, but I advised her I'm not sure if this will work for her infection Breathing treatment here today-nebulizer, DuoNeb Albuterol inhaler as needed Continue over-the-counter cold medications-nothing that will elevate her blood pressure Call if no improvement

## 2017-05-10 NOTE — Progress Notes (Signed)
Subjective:    Patient ID: [name], female    DOB: 03/29/1929, 83 y.o.   MRN: [id number]  HPI The patient is here for an acute visit for Pneumonia and elevated BP.   On 9/21 she was wheezing and had rattling in her chest.  She went to urgent care the next day.  She had a CXR and there were white patches all over her lungs. She was told she had pneumonia.  She was started on a zpak, which she completed.  She has been taking Tylenol, Zyrtec and some old prescription cough syrup. She does not feel any better. She continues to have coughing, wheezing, tightness in her chest and back pain.   Denies decreased appetite, fatigue and nasal congestion, left-sided ear pain, sinus pressure, nausea, headaches and lightheadedness. She has not had any fevers or chills. She has had intolerances to several antibiotics, which is why she is always taking the Z-Pak and typically that has worked for her.  Her BP was 204/104 yesterday.  This am her BP at home was 188/88.  She has been having palpitations and took metoprolol yesterday it helped-she was prescribed this to take as needed for palpitations. She is unsure why her blood pressure has been so high he was very concerned. She denies taking any other medications than what was mentioned above.     Medications and allergies reviewed with patient and updated if appropriate.  Patient Active Problem List   Diagnosis Date Noted  . Constipation 01/25/2017  . Closed fracture of proximal end of right humerus 07/09/2016  . Cough 06/17/2016  . AP (abdominal pain) 03/10/2016  . Hearing loss 01/03/2016  . Allergic rhinitis 01/03/2016  . Minimal Coronary Plaque by Cardiac Cath in 2013 09/11/2015  . Palpitations 09/06/2015  . Frequent loose stools 08/28/2015  . Hyponatremia   . Acute mesenteric ischemia ([place])   . Hypothyroidism 07/11/2012  . Syncope 03/18/2012  . Abnormal LFTs   . Hypercholesteremia   . Hypertension   . Diabetes mellitus type 1 ([place])     . Osteoarthritis   . Thyroid nodule   . Osteoporosis, post-menopausal     Current Outpatient Prescriptions on File Prior to Visit  Medication Sig Dispense Refill  . ALPRAZolam (XANAX) 0.5 MG tablet Take 0.5 tablets (0.25 mg total) by mouth 2 (two) times daily as needed for anxiety or sleep. 30 tablet 5  . aspirin 81 MG tablet Take 81 mg by mouth daily.    . B Complex-C (SUPER B COMPLEX PO) Take 1 tablet by mouth daily.    . BD INSULIN SYRINGE ULTRAFINE 31G X 15/64" 0.3 ML MISC USE WITH INJECTIONS 100 each 3  . BD PEN NEEDLE NANO U/F 32G X 4 MM MISC   0  . cholecalciferol (VITAMIN D) 1000 UNITS tablet Take 5,000 Units by mouth daily.     . Cyanocobalamin (VITAMIN B-12 PO) Take 1,000 mg by mouth daily.    . fish oil-omega-3 fatty acids 1000 MG capsule Take 2 g by mouth daily.    [name] glucose blood (ONE TOUCH ULTRA TEST) test strip CHECK BLOOD SUGARS 8 TIMES DAILY    . hydrochlorothiazide (MICROZIDE) 12.5 MG capsule Take 12.5 mg by mouth every other day.     . Insulin Glargine (LANTUS SOLOSTAR) 100 UNIT/ML Solostar Pen Inject 16 Units into the skin daily at 10 pm.     . insulin lispro (HUMALOG KWIKPEN) 100 UNIT/ML KiwkPen Inject into the skin. Sliding scale before meal    .  levothyroxine (SYNTHROID, LEVOTHROID) 112 MCG tablet TAKE 1 TABLET ONCE DAILY. 90 tablet 1  . losartan (COZAAR) 100 MG tablet Take 1 tablet (100 mg total) by mouth daily. 90 tablet 3  . metoprolol (LOPRESSOR) 50 MG tablet Take 1/2 tablet by mouth daily as needed for rapid palpitations.  May repeat one in 1 hour if palpitations continue.    . Multiple Vitamin (MULTIVITAMIN) tablet Take 1 tablet by mouth daily. For breast and bone health    . ranitidine (ZANTAC) 150 MG tablet Take 1 tablet (150 mg total) by mouth at bedtime. 90 tablet 1  . simvastatin (ZOCOR) 20 MG tablet Take 20 mg by mouth at bedtime.     . vitamin C (ASCORBIC ACID) 500 MG tablet Take 1,000 mg by mouth daily.    . vitamin E 400 UNIT capsule Take 400 Units by  mouth daily.     No current facility-administered medications on file prior to visit.     Past Medical History:  Diagnosis Date  . Arthritis    fingers  . Cancer of upper-outer quadrant of female breast ([place]) 08/21/2011   ER +  PR +  Her 2 -  Ki67 9%   0.9 cm invasive lobular  Carcinoma ,s/p central lumpectomy with sentinel node biopsy,  ER/PR positive s/p re-excion on 09/16/11 with final pathology showing atypical hyperplasia   . CAP (community acquired pneumonia) 12/06/2016  . Diabetes mellitus    type wears insulin pump  . Diabetes mellitus type 1 (HCC)    type I- wears insulin pump  . GERD (gastroesophageal reflux disease)   . Hypercholesteremia   . Hypertension   . Hyponatremia   . Hypothyroid   . Hypothyroidism   . Osteoarthritis   . Osteoporosis, post-menopausal    DEXA 12/02/11: -3.5 (with lomax gyn), declines bisphos due to bone pain  . PAF (paroxysmal atrial fibrillation) ([place])    One episode in 2008, spontaneously converted in the ED, no further treatment  . Spondylosis    thoracic spine  . Thyroid nodule dx 07/2011   [place] q 57moto follow calcified L thyroid nodule (12/08/11 [place]    Past Surgical History:  Procedure Laterality Date  . ABDOMINAL HYSTERECTOMY    . ABDOMINAL HYSTERECTOMY  yrs ago  . APPENDECTOMY    . APPENDECTOMY  age 89 . BLADDER REPAIR  5 yrs ago  . BREAST LUMPECTOMY    . BREAST SURGERY     biopsy  . BREAST SURGERY   yrs ago   br bx  . BREAST SURGERY  01/ 23/2013   left central lumpectomy and snbx, re-excision lumpectomy  . CARDIOVASCULAR STRESS TEST  03/17/2012  . LEFT HEART CATHETERIZATION WITH CORONARY ANGIOGRAM N/A 03/18/2012   Procedure: LEFT HEART CATHETERIZATION WITH CORONARY ANGIOGRAM;  Surgeon: JMinus Breeding MD;  [facility];  Service: Cardiovascular;  Laterality: N/A;  . TONSILLECTOMY   age 83   Social History   Social History  . Marital status: Married    Spouse name: N/A  . Number of children: N/A  . Years of  education: N/A   Social History Main Topics  . Smoking status: Former Smoker    Packs/day: 1.00    Years: 10.00    Types: Cigarettes    Quit date: 08/27/1977  . Smokeless tobacco: Never Used  . Alcohol use Yes     Comment: rare  . Drug use: No  . Sexual activity: Yes    Birth control/ protection: Post-menopausal  Other Topics Concern  . None   Social History Narrative   ** Merged History Encounter **        Family History  Problem Relation Age of Onset  . Cancer Sister        unknown  . Hypertension Mother   . Stroke Mother   . Arthritis Father   . Heart disease Father   . Diabetes Son     Review of Systems  Constitutional: Positive for appetite change (decreased) and fatigue. Negative for chills and fever.  HENT: Positive for congestion, ear pain and sinus pressure. Negative for sinus pain and sore throat.   Respiratory: Positive for cough (not able to bring phlegm up), chest tightness, shortness of breath and wheezing.   Cardiovascular: Positive for chest pain and palpitations.  Gastrointestinal: Positive for nausea.  Musculoskeletal: Positive for back pain.  Neurological: Positive for light-headedness and headaches.       Objective:   Vitals:   05/10/17 1103  BP: (!) 172/98  Pulse: 74  Resp: 18  Temp: 97.8 F (36.6 C)  SpO2: 95%   Wt Readings from Last 3 Encounters:  05/10/17 138 lb (62.6 kg)  03/17/17 142 lb (64.4 kg)  01/25/17 140 lb (63.5 kg)   Body mass index is 21.61 kg/m.   Physical Exam    Constitutional: Appears well-developed and well-nourished. No distress.  HENT:  Head: Normocephalic and atraumatic.  Neck: Neck supple. No tracheal deviation present. No thyromegaly present.  No cervical lymphadenopathy Cardiovascular: Normal rate, regular rhythm and normal heart sounds.   No murmur heard.  No edema Pulmonary/Chest: Effort normal and breath sounds normal. No respiratory distress. A couple of very mild wheezes heard on expiration. No  rales.  Skin: Skin is warm and dry. Not diaphoretic.  Psychiatric: Normal mood and affect. Behavior is normal.      Assessment & Plan:    See Problem List for Assessment and Plan of chronic medical problems.

## 2017-05-10 NOTE — Assessment & Plan Note (Signed)
Blood pressure has been elevated for the past couple of days, likely related to her pneumonia She has been taking some cold medications, but nothing that should elevate her blood pressure Advised Coricidin cold products Since she is experiencing palpitations she will try taking the metoprolol 25 mg twice daily as needed to help with her blood pressure and palpitations over the next few days She will continue to monitor her blood pressure closely home and call if it does not decrease

## 2017-05-10 NOTE — Patient Instructions (Signed)
Have a chest xray today  Test(s) ordered today. Your results will be released to [place] (or called to you) after review, usually within 72hours after test completion. If any changes need to be made, you will be notified at that same time.   Medications reviewed and updated.  Changes include starting Zpak and using an inhaler.  You had a breathing treatment today.   Your prescription(s) have been submitted to your pharmacy. Please take as directed and contact our office if you believe you are having problem(s) with the medication(s).  Call if no improvement

## 2017-06-03 ENCOUNTER — Telehealth: Payer: Self-pay | Admitting: Internal Medicine

## 2017-06-03 NOTE — Telephone Encounter (Signed)
Spoke with pt to inform.  

## 2017-06-03 NOTE — Telephone Encounter (Signed)
Pt states she was diagnosed with colitis and prescribed her a expensive medication and does not know the name of the medication, wants to make sure Burns agrees wit her taking this  She states the Dr. Michela Pitcher she has had colitis since her last colonoscopy  Please advise and call back

## 2017-06-03 NOTE — Telephone Encounter (Signed)
I agree.  If she does have colitis she needs to take medication to decrease the inflammation

## 2017-07-05 ENCOUNTER — Encounter: Payer: Self-pay | Admitting: Internal Medicine

## 2017-07-05 ENCOUNTER — Ambulatory Visit: Payer: Medicare Other | Admitting: Internal Medicine

## 2017-07-05 ENCOUNTER — Other Ambulatory Visit (INDEPENDENT_AMBULATORY_CARE_PROVIDER_SITE_OTHER): Payer: Medicare Other

## 2017-07-05 VITALS — BP 158/70 | HR 94 | Temp 98.1°F | Resp 16 | Wt 141.0 lb

## 2017-07-05 DIAGNOSIS — E871 Hypo-osmolality and hyponatremia: Secondary | ICD-10-CM | POA: Diagnosis not present

## 2017-07-05 DIAGNOSIS — R252 Cramp and spasm: Secondary | ICD-10-CM | POA: Diagnosis not present

## 2017-07-05 DIAGNOSIS — R197 Diarrhea, unspecified: Secondary | ICD-10-CM | POA: Diagnosis not present

## 2017-07-05 LAB — COMPREHENSIVE METABOLIC PANEL
ALT: 14 U/L (ref 0–35)
AST: 25 U/L (ref 0–37)
Albumin: 4.1 g/dL (ref 3.5–5.2)
Alkaline Phosphatase: 64 U/L (ref 39–117)
BUN: 9 mg/dL (ref 6–23)
CHLORIDE: 100 meq/L (ref 96–112)
CO2: 28 meq/L (ref 19–32)
Calcium: 9.7 mg/dL (ref 8.4–10.5)
Creatinine, Ser: 0.67 mg/dL (ref 0.40–1.20)
GFR: 88.68 mL/min (ref >60.00)
GLUCOSE: 160 mg/dL — AB (ref 70–99)
POTASSIUM: 4.2 meq/L (ref 3.5–5.1)
Sodium: 135 mEq/L (ref 135–145)
TOTAL PROTEIN: 7.4 g/dL (ref 6.0–8.3)
Total Bilirubin: 0.4 mg/dL (ref 0.2–1.2)

## 2017-07-05 LAB — MAGNESIUM: MAGNESIUM: 1.9 mg/dL (ref 1.5–2.5)

## 2017-07-05 MED ORDER — SYNTHROID 112 MCG PO TABS: 112.0000 ug | ORAL_TABLET | Freq: Every day | ORAL | 1 refills | Status: DC

## 2017-07-05 NOTE — Assessment & Plan Note (Signed)
Will check cmp, magnesium level continue increased water intake

## 2017-07-05 NOTE — Patient Instructions (Signed)
Start taking a stool softener and see if treating constipation helps your symptoms.

## 2017-07-05 NOTE — Assessment & Plan Note (Addendum)
Concern for chronic severe constipation causing stool impaction and loose stools Has bloating, discomfort Start bowel regimen - stool softeners - may need to try a couple of different things She will update me

## 2017-07-05 NOTE — Progress Notes (Signed)
Subjective:    Patient ID: [name], female    DOB: 1929/03/25, 83 y.o.   MRN: [id number]  HPI The patient is here for follow up.  She saw GI at [facility] and he started her on budesonide and she took it for 18 days and her sugars were 400-500's.  He took her off of it.  He did not due any additional imaging or scopes - he thought she had colitis based on her prior colonoscopy and Ct results.   She continues to have loose stools, small amounts of stool at a time, she has to strain, she has a constant urge to have a bowel movement. She has abdominal bloating.  She has discomfort in the abdomen.   Overall she has small of amount of stools a few times a day.    She is having leg cramps recently and wonders about her sodium and potassium levels.  She is drinking plenty of water.    Medications and allergies reviewed with patient and updated if appropriate.  Patient Active Problem List   Diagnosis Date Noted  . Pneumonia due to infectious organism 05/10/2017  . Constipation 01/25/2017  . Closed fracture of proximal end of right humerus 07/09/2016  . Cough 06/17/2016  . AP (abdominal pain) 03/10/2016  . Hearing loss 01/03/2016  . Allergic rhinitis 01/03/2016  . Minimal Coronary Plaque by Cardiac Cath in 2013 09/11/2015  . Palpitations 09/06/2015  . Frequent loose stools 08/28/2015  . Hyponatremia   . Acute mesenteric ischemia ([place])   . Hypothyroidism 07/11/2012  . Syncope 03/18/2012  . Abnormal LFTs   . Hypercholesteremia   . Hypertension   . Diabetes mellitus type 1 ([place])   . Osteoarthritis   . Thyroid nodule   . Osteoporosis, post-menopausal     Current Outpatient Medications on File Prior to Visit  Medication Sig Dispense Refill  . ALPRAZolam (XANAX) 0.5 MG tablet Take 0.5 tablets (0.25 mg total) by mouth 2 (two) times daily as needed for anxiety or sleep. 30 tablet 5  . aspirin 81 MG tablet Take 81 mg by mouth daily.    . B Complex-C (SUPER B COMPLEX PO) Take 1 tablet  by mouth daily.    . BD INSULIN SYRINGE ULTRAFINE 31G X 15/64" 0.3 ML MISC USE WITH INJECTIONS 100 each 3  . BD PEN NEEDLE NANO U/F 32G X 4 MM MISC   0  . cholecalciferol (VITAMIN D) 1000 UNITS tablet Take 5,000 Units by mouth daily.     . Cyanocobalamin (VITAMIN B-12 PO) Take 1,000 mg by mouth daily.    . fish oil-omega-3 fatty acids 1000 MG capsule Take 2 g by mouth daily.    [name] glucose blood (ONE TOUCH ULTRA TEST) test strip CHECK BLOOD SUGARS 8 TIMES DAILY    . hydrochlorothiazide (MICROZIDE) 12.5 MG capsule Take 12.5 mg by mouth every other day.     . Insulin Glargine (LANTUS SOLOSTAR) 100 UNIT/ML Solostar Pen Inject 16 Units into the skin daily at 10 pm.     . insulin lispro (HUMALOG KWIKPEN) 100 UNIT/ML KiwkPen Inject into the skin. Sliding scale before meal    . losartan (COZAAR) 100 MG tablet Take 1 tablet (100 mg total) by mouth daily. 90 tablet 3  . Multiple Vitamin (MULTIVITAMIN) tablet Take 1 tablet by mouth daily. For breast and bone health    . ranitidine (ZANTAC) 150 MG tablet Take 1 tablet (150 mg total) by mouth at bedtime. 90 tablet 1  .  simvastatin (ZOCOR) 20 MG tablet Take 20 mg by mouth at bedtime.     . vitamin C (ASCORBIC ACID) 500 MG tablet Take 1,000 mg by mouth daily.    . vitamin E 400 UNIT capsule Take 400 Units by mouth daily.     No current facility-administered medications on file prior to visit.     Past Medical History:  Diagnosis Date  . Arthritis    fingers  . Cancer of upper-outer quadrant of female breast ([place]) 08/21/2011   ER +  PR +  Her 2 -  Ki67 9%   0.9 cm invasive lobular  Carcinoma ,s/p central lumpectomy with sentinel node biopsy,  ER/PR positive s/p re-excion on 09/16/11 with final pathology showing atypical hyperplasia   . CAP (community acquired pneumonia) 12/06/2016  . Diabetes mellitus    type wears insulin pump  . Diabetes mellitus type 1 (HCC)    type I- wears insulin pump  . GERD (gastroesophageal reflux disease)   . Hypercholesteremia    . Hypertension   . Hyponatremia   . Hypothyroid   . Hypothyroidism   . Osteoarthritis   . Osteoporosis, post-menopausal    DEXA 12/02/11: -3.5 (with lomax gyn), declines bisphos due to bone pain  . PAF (paroxysmal atrial fibrillation) ([place])    One episode in 2008, spontaneously converted in the ED, no further treatment  . Spondylosis    thoracic spine  . Thyroid nodule dx 07/2011   [place] q 60moto follow calcified L thyroid nodule (12/08/11 [place]    Past Surgical History:  Procedure Laterality Date  . ABDOMINAL HYSTERECTOMY    . ABDOMINAL HYSTERECTOMY  yrs ago  . APPENDECTOMY    . APPENDECTOMY  age 83 . BLADDER REPAIR  5 yrs ago  . BREAST LUMPECTOMY    . BREAST SURGERY     biopsy  . BREAST SURGERY   yrs ago   br bx  . BREAST SURGERY  01/ 23/2013   left central lumpectomy and snbx, re-excision lumpectomy  . CARDIOVASCULAR STRESS TEST  03/17/2012  . LEFT HEART CATHETERIZATION WITH CORONARY ANGIOGRAM N/A 03/18/2012   Procedure: LEFT HEART CATHETERIZATION WITH CORONARY ANGIOGRAM;  Surgeon: JMinus Breeding MD;  [facility];  Service: Cardiovascular;  Laterality: N/A;  . TONSILLECTOMY   age 83   Social History   Socioeconomic History  . Marital status: Married    Spouse name: Not on file  . Number of children: Not on file  . Years of education: Not on file  . Highest education level: Not on file  Social Needs  . Financial resource strain: Not on file  . Food insecurity - worry: Not on file  . Food insecurity - inability: Not on file  . Transportation needs - medical: Not on file  . Transportation needs - non-medical: Not on file  Occupational History  . Not on file  Tobacco Use  . Smoking status: Former Smoker    Packs/day: 1.00    Years: 10.00    Pack years: 10.00    Types: Cigarettes    Last attempt to quit: 08/27/1977    Years since quitting: 39.8  . Smokeless tobacco: Never Used  Substance and Sexual Activity  . Alcohol use: Yes    Comment: rare  . Drug  use: No  . Sexual activity: Yes    Birth control/protection: Post-menopausal  Other Topics Concern  . Not on file  Social History Narrative   ** Merged History Encounter **  Family History  Problem Relation Age of Onset  . Cancer Sister        unknown  . Hypertension Mother   . Stroke Mother   . Arthritis Father   . Heart disease Father   . Diabetes Son     Review of Systems  Constitutional: Negative for appetite change, chills and fever.  Respiratory: Negative for shortness of breath.   Cardiovascular: Negative for chest pain, palpitations and leg swelling.  Gastrointestinal: Positive for abdominal distention, abdominal pain, constipation, diarrhea and nausea. Negative for blood in stool.  Neurological: Negative for light-headedness and headaches.       Objective:   Vitals:   07/05/17 1505  BP: (!) 158/70  Pulse: 94  Resp: 16  Temp: 98.1 F (36.7 C)  SpO2: 94%   Wt Readings from Last 3 Encounters:  07/05/17 141 lb (64 kg)  05/10/17 138 lb (62.6 kg)  03/17/17 142 lb (64.4 kg)   Body mass index is 22.08 kg/m.   Physical Exam    Constitutional: Appears well-developed and well-nourished. No distress.  HENT:  Head: Normocephalic and atraumatic.  Abdomen: soft, tenderness in left side of abdomen and right lower abdomen w/o rebound or guarding, non distended Skin: Skin is warm and dry. Not diaphoretic.  Psychiatric: Normal mood and affect. Behavior is normal.      Assessment & Plan:    See Problem List for Assessment and Plan of chronic medical problems.

## 2017-07-05 NOTE — Assessment & Plan Note (Signed)
H/o low sodium in past due to hctz cmp

## 2017-07-07 ENCOUNTER — Encounter: Payer: Self-pay | Admitting: Internal Medicine

## 2017-07-21 ENCOUNTER — Ambulatory Visit: Payer: Medicare Other | Admitting: Internal Medicine

## 2017-07-21 ENCOUNTER — Encounter: Payer: Self-pay | Admitting: Internal Medicine

## 2017-07-21 VITALS — BP 138/60 | HR 74 | Ht 67.0 in | Wt 140.6 lb

## 2017-07-21 DIAGNOSIS — R002 Palpitations: Secondary | ICD-10-CM | POA: Diagnosis not present

## 2017-07-21 DIAGNOSIS — I10 Essential (primary) hypertension: Secondary | ICD-10-CM | POA: Diagnosis not present

## 2017-07-21 NOTE — Progress Notes (Signed)
HPI [name] returns today for ongoing evaluation and management.  She is a very pleasant now 83 year old woman with a history of palpitations, a remote history of unexplained syncope, and a remote history of non-ST elevation MI, with no obstructive coronary disease.  She has a history of breast cancer status post radiation therapy.  She has a history of aortic valve sclerosis by echo.  She is recovered from her broken shoulder which she sustained a year ago.  She denies chest pain or shortness of breath.  Her only complaint today is rare episodes of palpitations which often wake her from sleep.  Her husband who is with her today notes that she has very bizarre dreams she observes when she is sleeping. Allergies  Allergen Reactions  . Amlodipine     Dizzy, nausea  . Augmentin [Amoxicillin-Pot Clavulanate] Other (See Comments)    Gi upset only no rash or hives   . Cortisone Other (See Comments)    Makes blood sugars run high  . Keflex [Cephalexin] Hives and Nausea Only    Stomach upset  . Omeprazole Nausea Only    Dizziness and nausea  . Prednisone     Other reaction(s): Other Makes blood sugars run high  . Hydrocodone Rash    Rash to chest , side back  . Latex Rash    Powder in gloves causes rash; "not allergic to latex just the powder"     Current Outpatient Medications  Medication Sig Dispense Refill  . ALPRAZolam (XANAX) 0.5 MG tablet Take 0.5 tablets (0.25 mg total) by mouth 2 (two) times daily as needed for anxiety or sleep. 30 tablet 5  . aspirin 81 MG tablet Take 81 mg by mouth daily.    . B Complex-C (SUPER B COMPLEX PO) Take 1 tablet by mouth daily.    . BD INSULIN SYRINGE ULTRAFINE 31G X 15/64" 0.3 ML MISC USE WITH INJECTIONS 100 each 3  . BD PEN NEEDLE NANO U/F 32G X 4 MM MISC   0  . cholecalciferol (VITAMIN D) 1000 UNITS tablet Take 5,000 Units by mouth daily.     . Cyanocobalamin (VITAMIN B-12 PO) Take 1,000 mg by mouth daily.    . fish oil-omega-3 fatty  acids 1000 MG capsule Take 2 g by mouth daily.    [name] glucose blood (ONE TOUCH ULTRA TEST) test strip CHECK BLOOD SUGARS 8 TIMES DAILY    . hydrochlorothiazide (MICROZIDE) 12.5 MG capsule Take 12.5 mg by mouth every other day.     . Insulin Glargine (LANTUS SOLOSTAR) 100 UNIT/ML Solostar Pen Inject 16 Units into the skin daily at 10 pm.     . insulin lispro (HUMALOG KWIKPEN) 100 UNIT/ML KiwkPen Inject into the skin. Sliding scale before meal    . losartan (COZAAR) 100 MG tablet Take 1 tablet (100 mg total) by mouth daily. 90 tablet 3  . Multiple Vitamin (MULTIVITAMIN) tablet Take 1 tablet by mouth daily. For breast and bone health    . ranitidine (ZANTAC) 150 MG tablet Take 1 tablet (150 mg total) by mouth at bedtime. 90 tablet 1  . simvastatin (ZOCOR) 20 MG tablet Take 20 mg by mouth at bedtime.     [name] SYNTHROID 112 MCG tablet Take 1 tablet (112 mcg total) by mouth daily before breakfast. 90 tablet 1  . vitamin C (ASCORBIC ACID) 500 MG tablet Take 1,000 mg by mouth daily.    . vitamin E 400 UNIT capsule Take 400 Units by  mouth daily.     No current facility-administered medications for this visit.      Past Medical History:  Diagnosis Date  . Arthritis    fingers  . Cancer of upper-outer quadrant of female breast ([place]) 08/21/2011   ER +  PR +  Her 2 -  Ki67 9%   0.9 cm invasive lobular  Carcinoma ,s/p central lumpectomy with sentinel node biopsy,  ER/PR positive s/p re-excion on 09/16/11 with final pathology showing atypical hyperplasia   . CAP (community acquired pneumonia) 12/06/2016  . Diabetes mellitus    type wears insulin pump  . Diabetes mellitus type 1 (HCC)    type I- wears insulin pump  . GERD (gastroesophageal reflux disease)   . Hypercholesteremia   . Hypertension   . Hyponatremia   . Hypothyroid   . Hypothyroidism   . Osteoarthritis   . Osteoporosis, post-menopausal    DEXA 12/02/11: -3.5 (with lomax gyn), declines bisphos due to bone pain  . PAF (paroxysmal atrial  fibrillation) ([place])    One episode in 2008, spontaneously converted in the ED, no further treatment  . Spondylosis    thoracic spine  . Thyroid nodule dx 07/2011   [place] q 38moto follow calcified L thyroid nodule (12/08/11 [place]    ROS:   All systems reviewed and negative except as noted in the HPI.   Past Surgical History:  Procedure Laterality Date  . ABDOMINAL HYSTERECTOMY    . ABDOMINAL HYSTERECTOMY  yrs ago  . APPENDECTOMY    . APPENDECTOMY  age 83 . BLADDER REPAIR  5 yrs ago  . BREAST LUMPECTOMY    . BREAST SURGERY     biopsy  . BREAST SURGERY   yrs ago   br bx  . BREAST SURGERY  01/ 23/2013   left central lumpectomy and snbx, re-excision lumpectomy  . CARDIOVASCULAR STRESS TEST  03/17/2012  . LEFT HEART CATHETERIZATION WITH CORONARY ANGIOGRAM N/A 03/18/2012   Procedure: LEFT HEART CATHETERIZATION WITH CORONARY ANGIOGRAM;  Surgeon: JMinus Breeding MD;  [facility];  Service: Cardiovascular;  Laterality: N/A;  . TONSILLECTOMY   age 83    Family History  Problem Relation Age of Onset  . Cancer Sister        unknown  . Hypertension Mother   . Stroke Mother   . Arthritis Father   . Heart disease Father   . Diabetes Son      Social History   Socioeconomic History  . Marital status: Married    Spouse name: Not on file  . Number of children: Not on file  . Years of education: Not on file  . Highest education level: Not on file  Social Needs  . Financial resource strain: Not on file  . Food insecurity - worry: Not on file  . Food insecurity - inability: Not on file  . Transportation needs - medical: Not on file  . Transportation needs - non-medical: Not on file  Occupational History  . Not on file  Tobacco Use  . Smoking status: Former Smoker    Packs/day: 1.00    Years: 10.00    Pack years: 10.00    Types: Cigarettes    Last attempt to quit: 08/27/1977    Years since quitting: 39.9  . Smokeless tobacco: Never Used  Substance and Sexual Activity    . Alcohol use: Yes    Comment: rare  . Drug use: No  . Sexual activity: Yes  Birth control/protection: Post-menopausal  Other Topics Concern  . Not on file  Social History Narrative   ** Merged History Encounter **         BP 138/60   Pulse 74   Ht '5\' 7"'$  (1.702 m)   Wt 140 lb 9.6 oz (63.8 kg)   SpO2 96%   BMI 22.02 kg/m   Physical Exam:  Well appearing 83 year old woman, NAD HEENT: Unremarkable Neck: 6 cm JVD, no thyromegally Lymphatics:  No adenopathy Back:  No CVA tenderness Lungs:  Clear, with no wheezes, rales, or rhonchi HEART:  Regular rate rhythm, soft systolic murmur at the right upper sternal border, no rubs, no clicks Abd:  soft, positive bowel sounds, no organomegally, no rebound, no guarding Ext:  2 plus pulses, no edema, no cyanosis, no clubbing Skin:  No rashes no nodules Neuro:  CN II through XII intact, motor grossly intact  EKG -normal sinus rhythm   Assess/Plan: 1.  Syncope -she has had no recurrent episodes.  She will undergo watchful waiting. 2.  Palpitations -these occur when she awakens in the morning and are quite variable.  It is unclear whether this represents an arrhythmia or sinus tachycardia.  We discussed additional heart monitoring and she will undergo watchful waiting for now. 3.  Aortic valve sclerosis -she is asymptomatic.  Her exam has not changed.  She will undergo watchful waiting.  Crissie Sickles, MD

## 2017-07-21 NOTE — Patient Instructions (Addendum)

## 2017-08-16 ENCOUNTER — Telehealth: Payer: Self-pay | Admitting: Internal Medicine

## 2017-08-16 NOTE — Telephone Encounter (Signed)
Have her try miralax daily

## 2017-08-16 NOTE — Telephone Encounter (Signed)
Copied from [place]. Topic: Quick Communication - See Telephone Encounter >> Aug 16, 2017  9:23 AM Cleaster Corin, NT wrote: CRM for notification. See Telephone encounter for:   08/16/17. Pt.calling to speak with Dr. Quay Burow or nurse to let them know she is using the stool softer its not working and she needs to know which route to go next pt. Can be reached [phone]

## 2017-08-16 NOTE — Telephone Encounter (Signed)
Spoke with pt to inform.  

## 2017-08-30 ENCOUNTER — Ambulatory Visit: Payer: Medicare Other | Admitting: Hematology and Oncology

## 2017-08-30 NOTE — Assessment & Plan Note (Signed)
history of Stage IA left breast invasive lobular carcinoma, ER+/PR+/HER2-, diagnosed in 08/2011, treated with lumpectomy, unable to tolerate Tamoxifen, and declined adjuvant radiation  Breast Cancer Surveillance: 1. Breast exam 08/31/17: Normal 2. Mammogram 01/11/17 No abnormalities. Postsurgical changes. Breast Density Category C.  RTC in 1 year

## 2017-08-31 ENCOUNTER — Inpatient Hospital Stay: Payer: Medicare Other | Attending: Hematology and Oncology | Admitting: Hematology and Oncology

## 2017-08-31 ENCOUNTER — Telehealth: Payer: Self-pay | Admitting: Hematology and Oncology

## 2017-08-31 DIAGNOSIS — Z853 Personal history of malignant neoplasm of breast: Secondary | ICD-10-CM | POA: Insufficient documentation

## 2017-08-31 DIAGNOSIS — C50412 Malignant neoplasm of upper-outer quadrant of left female breast: Secondary | ICD-10-CM

## 2017-08-31 DIAGNOSIS — Z170 Estrogen receptor positive status [ER+]: Secondary | ICD-10-CM | POA: Diagnosis not present

## 2017-08-31 NOTE — Progress Notes (Signed)
Patient Care Team: Binnie Rail, MD as PCP - General (Internal Medicine) Rolm Bookbinder, MD (General Surgery) Lonna Duval, MD (Endocrinology) Marica Otter, OD (Optometry)  DIAGNOSIS:  Encounter Diagnosis  Name Primary?  . Primary cancer of upper outer quadrant of left female breast ([place])     SUMMARY OF ONCOLOGIC HISTORY:   Primary cancer of upper outer quadrant of left female breast ([place])   09/02/2011 Surgery    Left lumpectomy: grade 1 lobular carcinoma measuring 0.9 cm with LCIS Medial and superior margins were focally positive, reexcision margins negative, 0/1 sentinel node, ER/PR positive HER-2 negative      09/25/2011 - 01/07/2012 Anti-estrogen oral therapy    Patient could not tolerate tamoxifen, also did not receive radiation at [facility] because of coronary artery disease and patient stopped after one radiation treatment       CHIEF COMPLIANT: Surveillance of left breast cancer  INTERVAL HISTORY: [name] is a 88-year with above-mentioned history left breast invasive lobular cancer treated with surgery alone and is here for annual follow-up.  She reports no pain or discomfort in the breast.  Denies any lumps or nodules.  Her mammograms have remained normal.  She could not tolerate antiestrogen therapy previously.  REVIEW OF SYSTEMS:   Constitutional: Denies fevers, chills or abnormal weight loss Eyes: Denies blurriness of vision Ears, nose, mouth, throat, and face: Denies mucositis or sore throat Respiratory: Denies cough, dyspnea or wheezes Cardiovascular: Denies palpitation, chest discomfort Gastrointestinal:  Denies nausea, heartburn or change in bowel habits Skin: Denies abnormal skin rashes Lymphatics: Denies new lymphadenopathy or easy bruising Neurological:Denies numbness, tingling or new weaknesses Behavioral/Psych: Mood is stable, no new changes  Extremities: No lower extremity edema Breast:  denies any pain or lumps or nodules  in either breasts All other systems were reviewed with the patient and are negative.  I have reviewed the past medical history, past surgical history, social history and family history with the patient and they are unchanged from previous note.  ALLERGIES:  is allergic to amlodipine; augmentin [amoxicillin-pot clavulanate]; cortisone; keflex [cephalexin]; omeprazole; prednisone; hydrocodone; and latex.  MEDICATIONS:  Current Outpatient Medications  Medication Sig Dispense Refill  . ALPRAZolam (XANAX) 0.5 MG tablet Take 0.5 tablets (0.25 mg total) by mouth 2 (two) times daily as needed for anxiety or sleep. 30 tablet 5  . aspirin 81 MG tablet Take 81 mg by mouth daily.    . B Complex-C (SUPER B COMPLEX PO) Take 1 tablet by mouth daily.    . BD INSULIN SYRINGE ULTRAFINE 31G X 15/64" 0.3 ML MISC USE WITH INJECTIONS 100 each 3  . BD PEN NEEDLE NANO U/F 32G X 4 MM MISC   0  . cholecalciferol (VITAMIN D) 1000 UNITS tablet Take 5,000 Units by mouth daily.     . Cyanocobalamin (VITAMIN B-12 PO) Take 1,000 mg by mouth daily.    . fish oil-omega-3 fatty acids 1000 MG capsule Take 2 g by mouth daily.    [name] glucose blood (ONE TOUCH ULTRA TEST) test strip CHECK BLOOD SUGARS 8 TIMES DAILY    . hydrochlorothiazide (MICROZIDE) 12.5 MG capsule Take 12.5 mg by mouth every other day.     . Insulin Glargine (LANTUS SOLOSTAR) 100 UNIT/ML Solostar Pen Inject 16 Units into the skin daily at 10 pm.     . insulin lispro (HUMALOG KWIKPEN) 100 UNIT/ML KiwkPen Inject into the skin. Sliding scale before meal    . losartan (COZAAR) 100 MG tablet Take  1 tablet (100 mg total) by mouth daily. 90 tablet 3  . Multiple Vitamin (MULTIVITAMIN) tablet Take 1 tablet by mouth daily. For breast and bone health    . ranitidine (ZANTAC) 150 MG tablet Take 1 tablet (150 mg total) by mouth at bedtime. 90 tablet 1  . simvastatin (ZOCOR) 20 MG tablet Take 20 mg by mouth at bedtime.     [name] SYNTHROID 112 MCG tablet Take 1 tablet (112 mcg  total) by mouth daily before breakfast. 90 tablet 1  . vitamin C (ASCORBIC ACID) 500 MG tablet Take 1,000 mg by mouth daily.    . vitamin E 400 UNIT capsule Take 400 Units by mouth daily.     No current facility-administered medications for this visit.     PHYSICAL EXAMINATION: ECOG PERFORMANCE STATUS: 0 - Asymptomatic  Vitals:   08/31/17 1353  BP: (!) 156/67  Pulse: 83  Resp: 18  Temp: 98.3 F (36.8 C)  SpO2: 97%   There were no vitals filed for this visit.  GENERAL:alert, no distress and comfortable SKIN: skin color, texture, turgor are normal, no rashes or significant lesions EYES: normal, Conjunctiva are pink and non-injected, sclera clear OROPHARYNX:no exudate, no erythema and lips, buccal mucosa, and tongue normal  NECK: supple, thyroid normal size, non-tender, without nodularity LYMPH:  no palpable lymphadenopathy in the cervical, axillary or inguinal LUNGS: clear to auscultation and percussion with normal breathing effort HEART: regular rate & rhythm and no murmurs and no lower extremity edema ABDOMEN:abdomen soft, non-tender and normal bowel sounds MUSCULOSKELETAL:no cyanosis of digits and no clubbing  NEURO: alert & oriented x 3 with fluent speech, no focal motor/sensory deficits EXTREMITIES: No lower extremity edema BREAST: No palpable masses or nodules in either right or left breasts. No palpable axillary supraclavicular or infraclavicular adenopathy no breast tenderness or nipple discharge. (exam performed in the presence of a chaperone)  LABORATORY DATA:  I have reviewed the data as listed CMP Latest Ref Rng & Units 07/05/2017 03/22/2017 12/05/2016  Glucose 70 - 99 mg/dL 160(H) 122(H) 69  BUN 6 - 23 mg/dL '9 9 18  '$ Creatinine 0.40 - 1.20 mg/dL 0.67 0.70 0.78  Sodium 135 - 145 mEq/L 135 136 129(L)  Potassium 3.5 - 5.1 mEq/L 4.2 4.2 4.1  Chloride 96 - 112 mEq/L 100 100 94(L)  CO2 19 - 32 mEq/L '28 28 26  '$ Calcium 8.4 - 10.5 mg/dL 9.7 9.8 9.9  Total Protein 6.0 -  8.3 g/dL 7.4 7.1 8.3(H)  Total Bilirubin 0.2 - 1.2 mg/dL 0.4 0.7 0.5  Alkaline Phos 39 - 117 U/L 64 75 85  AST 0 - 37 U/L '25 22 31  '$ ALT 0 - 35 U/L '14 12 17    '$ Lab Results  Component Value Date   WBC 7.5 03/22/2017   HGB 13.0 03/22/2017   HCT 38.7 03/22/2017   MCV 90.9 03/22/2017   PLT 232.0 03/22/2017   NEUTROABS 3.7 03/22/2017    ASSESSMENT & PLAN:  Primary cancer of upper outer quadrant of left female breast ([place]) history of Stage IA left breast invasive lobular carcinoma, ER+/PR+/HER2-, diagnosed in 08/2011, treated with lumpectomy, unable to tolerate Tamoxifen, and declined adjuvant radiation  Breast Cancer Surveillance: 1. Breast exam 08/31/17: Normal 2. Mammogram 01/11/17 No abnormalities. Postsurgical changes. Breast Density Category C. Patient wishes to follow-up with me once a year for a breast exam and follow-up.  RTC in 1 year  I spent 15 minutes talking to the patient of which more than half was  spent in counseling and coordination of care.  No orders of the defined types were placed in this encounter.  The patient has a good understanding of the overall plan. she agrees with it. she will call with any problems that may develop before the next visit here.   Harriette Ohara, MD 08/31/17

## 2017-08-31 NOTE — Telephone Encounter (Signed)
Gave patient AVs and calendar of upcoming January 2020 appointments.  °

## 2017-09-14 NOTE — Progress Notes (Signed)
Subjective:    Patient ID: [name], female    DOB: 04-29-1929, 84 y.o.   MRN: [id number]  HPI The patient is here for follow up.  Constipation/diarrhea:  She was having loose stools in small amounts.  She had to strain and had a constant urge to have a BM.  She had abdominal bloating and discomfort.  She is taking metamucil every morning and has been having formed stools.  She still has some cramping in her abdomen.  She sometimes has 2-3 BM a day.  Osteoporosis: She is taking her calcium and vitamin D daily.  She is exercising on a regular basis-approximately 3 times a week  Hypertension: She is taking her medication daily. She is compliant with a low sodium diet.  She denies chest pain, palpitations, edema, shortness of breath and regular headaches. [facility] is [facility]      Hyperlipidemia: She is taking her medication daily. She is compliant with a low fat/cholesterol diet. She is exercising regularly. She denies myalgias.   Diabetes: she follows with endocrine.  She is taking her medication daily as prescribed. She is compliant with a diabetic diet. She is exercising regularly.  She checks her feet daily and denies foot lesions. She is up-to-date with an ophthalmology examination.   Hypothyroidism:  She follows with endocrine.  She is taking her medication daily.  She denies any recent changes in energy or weight that are unexplained.    Medications and allergies reviewed with patient and updated if appropriate.  Patient Active Problem List   Diagnosis Date Noted  . Leg cramps 07/05/2017  . Pneumonia due to infectious organism 05/10/2017  . Constipation 01/25/2017  . Closed fracture of proximal end of right humerus 07/09/2016  . Cough 06/17/2016  . AP (abdominal pain) 03/10/2016  . Hearing loss 01/03/2016  . Allergic rhinitis 01/03/2016  . Minimal Coronary Plaque by Cardiac Cath in 2013 09/11/2015  . Palpitations 09/06/2015  .  Frequent loose stools 08/28/2015  . Hyponatremia   . Acute mesenteric ischemia ([place])   . Hypothyroidism 07/11/2012  . Syncope 03/18/2012  . Abnormal LFTs   . Hypercholesteremia   . Hypertension   . Diabetes mellitus type 1 ([place])   . Osteoarthritis   . Thyroid nodule   . Osteoporosis, post-menopausal   . Primary cancer of upper outer quadrant of left female breast ([place]) 08/21/2011    Current Outpatient Medications on File Prior to Visit  Medication Sig Dispense Refill  . ALPRAZolam (XANAX) 0.5 MG tablet Take 0.5 tablets (0.25 mg total) by mouth 2 (two) times daily as needed for anxiety or sleep. 30 tablet 5  . aspirin 81 MG tablet Take 81 mg by mouth daily.    . B Complex-C (SUPER B COMPLEX PO) Take 1 tablet by mouth daily.    . BD INSULIN SYRINGE ULTRAFINE 31G X 15/64" 0.3 ML MISC USE WITH INJECTIONS 100 each 3  . BD PEN NEEDLE NANO U/F 32G X 4 MM MISC   0  . cholecalciferol (VITAMIN D) 1000 UNITS tablet Take 5,000 Units by mouth daily.     . Cyanocobalamin (VITAMIN B-12 PO) Take 1,000 mg by mouth daily.    . fish oil-omega-3 fatty acids 1000 MG capsule Take 2 g by mouth daily.    [name] glucose blood (ONE TOUCH ULTRA TEST) test strip CHECK BLOOD SUGARS 8 TIMES DAILY    . hydrochlorothiazide (MICROZIDE) 12.5 MG capsule Take  12.5 mg by mouth every other day.     . Insulin Glargine (LANTUS SOLOSTAR) 100 UNIT/ML Solostar Pen Inject 16 Units into the skin daily at 10 pm.     . insulin lispro (HUMALOG KWIKPEN) 100 UNIT/ML KiwkPen Inject into the skin. Sliding scale before meal    . levothyroxine (SYNTHROID, LEVOTHROID) 100 MCG tablet Take 100 mcg by mouth daily before breakfast.    . losartan (COZAAR) 100 MG tablet Take 1 tablet (100 mg total) by mouth daily. 90 tablet 3  . Multiple Vitamin (MULTIVITAMIN) tablet Take 1 tablet by mouth daily. For breast and bone health    . ranitidine (ZANTAC) 150 MG tablet Take 1 tablet (150 mg total) by mouth at bedtime. 90 tablet 1  . simvastatin (ZOCOR) 20  MG tablet Take 20 mg by mouth at bedtime.     . vitamin C (ASCORBIC ACID) 500 MG tablet Take 1,000 mg by mouth daily.    . vitamin E 400 UNIT capsule Take 400 Units by mouth daily.     No current facility-administered medications on file prior to visit.     Past Medical History:  Diagnosis Date  . Arthritis    fingers  . Cancer of upper-outer quadrant of female breast ([facility]) 08/21/2011   ER +  PR +  Her 2 -  Ki67 9%   0.9 cm invasive lobular  Carcinoma ,s/p central lumpectomy with sentinel node biopsy,  ER/PR positive s/p re-excion on 09/16/11 with final pathology showing atypical hyperplasia   . CAP (community acquired pneumonia) 12/06/2016  . Diabetes mellitus    type wears insulin pump  . Diabetes mellitus type 1 ([facility])    type I- wears insulin pump  . GERD (gastroesophageal reflux disease)   . Hypercholesteremia   . Hypertension   . Hyponatremia   . Hypothyroid   . Hypothyroidism   . Osteoarthritis   . Osteoporosis, post-menopausal    DEXA 12/02/11: -3.5 (with lomax gyn), declines bisphos due to bone pain  . PAF (paroxysmal atrial fibrillation) ([place])    One episode in 2008, spontaneously converted in the ED, no further treatment  . Spondylosis    thoracic spine  . Thyroid nodule dx 07/2011   [place] q 32moto follow calcified L thyroid nodule (12/08/11 [place]    Past Surgical History:  Procedure Laterality Date  . ABDOMINAL HYSTERECTOMY    . ABDOMINAL HYSTERECTOMY  yrs ago  . APPENDECTOMY    . APPENDECTOMY  age 84 . BLADDER REPAIR  5 yrs ago  . BREAST LUMPECTOMY    . BREAST SURGERY     biopsy  . BREAST SURGERY   yrs ago   br bx  . BREAST SURGERY  01/ 23/2013   left central lumpectomy and snbx, re-excision lumpectomy  . CARDIOVASCULAR STRESS TEST  03/17/2012  . LEFT HEART CATHETERIZATION WITH CORONARY ANGIOGRAM N/A 03/18/2012   Procedure: LEFT HEART CATHETERIZATION WITH CORONARY ANGIOGRAM;  Surgeon: JMinus Breeding MD;  [facility];  Service: Cardiovascular;  Laterality:  N/A;  . TONSILLECTOMY   age 84   Social History   Socioeconomic History  . Marital status: Married    Spouse name: Not on file  . Number of children: Not on file  . Years of education: Not on file  . Highest education level: Not on file  Social Needs  . Financial resource strain: Not on file  . Food insecurity - worry: Not on file  . Food insecurity - inability: Not  on file  . Transportation needs - medical: Not on file  . Transportation needs - non-medical: Not on file  Occupational History  . Not on file  Tobacco Use  . Smoking status: Former Smoker    Packs/day: 1.00    Years: 10.00    Pack years: 10.00    Types: Cigarettes    Last attempt to quit: 08/27/1977    Years since quitting: 40.0  . Smokeless tobacco: Never Used  Substance and Sexual Activity  . Alcohol use: Yes    Comment: rare  . Drug use: No  . Sexual activity: Yes    Birth control/protection: Post-menopausal  Other Topics Concern  . Not on file  Social History Narrative   ** Merged History Encounter **        Family History  Problem Relation Age of Onset  . Cancer Sister        unknown  . Hypertension Mother   . Stroke Mother   . Arthritis Father   . Heart disease Father   . Diabetes Son     Review of Systems  Constitutional: Negative for chills and fever.  HENT: Positive for sore throat (started today).   Respiratory: Positive for cough and shortness of breath (occ once in a while). Negative for wheezing.   Cardiovascular: Positive for palpitations (occ, chronic - no change). Negative for chest pain and leg swelling.  Gastrointestinal: Positive for abdominal pain (intermittent). Negative for diarrhea.  Neurological: Negative for light-headedness and headaches.       Objective:   Vitals:   09/15/17 1555  BP: (!) 152/76  Pulse: 85  Resp: 16  Temp: 98.4 F (36.9 C)  SpO2: 95%   Wt Readings from Last 3 Encounters:  09/15/17 138 lb (62.6 kg)  07/21/17 140 lb 9.6 oz (63.8 kg)    07/05/17 141 lb (64 kg)   Body mass index is 21.61 kg/m.   Physical Exam    Constitutional: Appears well-developed and well-nourished. No distress.  HENT:  Head: Normocephalic and atraumatic.  Neck: Neck supple. No tracheal deviation present. No thyromegaly present.  No cervical lymphadenopathy Cardiovascular: Normal rate, regular rhythm and normal heart sounds.   2/6 systolic murmur heard. No carotid bruit .  No edema Pulmonary/Chest: Effort normal and breath sounds normal. No respiratory distress. No has no wheezes. No rales.  Skin: Skin is warm and dry. Not diaphoretic.  Psychiatric: Normal mood and affect. Behavior is normal.      Assessment & Plan:    See Problem List for Assessment and Plan of chronic medical problems.

## 2017-09-15 ENCOUNTER — Encounter: Payer: Self-pay | Admitting: Internal Medicine

## 2017-09-15 ENCOUNTER — Ambulatory Visit: Payer: Medicare Other | Admitting: Internal Medicine

## 2017-09-15 DIAGNOSIS — M810 Age-related osteoporosis without current pathological fracture: Secondary | ICD-10-CM | POA: Diagnosis not present

## 2017-09-15 DIAGNOSIS — K5900 Constipation, unspecified: Secondary | ICD-10-CM

## 2017-09-15 DIAGNOSIS — I10 Essential (primary) hypertension: Secondary | ICD-10-CM | POA: Diagnosis not present

## 2017-09-15 DIAGNOSIS — E039 Hypothyroidism, unspecified: Secondary | ICD-10-CM | POA: Diagnosis not present

## 2017-09-15 DIAGNOSIS — E7800 Pure hypercholesterolemia, unspecified: Secondary | ICD-10-CM

## 2017-09-15 DIAGNOSIS — E1043 Type 1 diabetes mellitus with diabetic autonomic (poly)neuropathy: Secondary | ICD-10-CM

## 2017-09-15 MED ORDER — SYNTHROID 100 MCG PO TABS: 100.0000 ug | ORAL_TABLET | Freq: Every day | ORAL | Status: DC

## 2017-09-15 MED ORDER — ALPRAZOLAM 0.5 MG PO TABS: 0.2500 mg | ORAL_TABLET | Freq: Two times a day (BID) | ORAL | 5 refills | Status: DC | PRN

## 2017-09-15 NOTE — Assessment & Plan Note (Signed)
Lipid panel done by endocrine recently-very well controlled Continue statin

## 2017-09-15 NOTE — Assessment & Plan Note (Signed)
Switch to Synthroid by endocrine Dose recently adjusted Continue current dose

## 2017-09-15 NOTE — Assessment & Plan Note (Signed)
Defer treatment for osteoporosis at this time She has increased her calcium intake Taking vitamin D daily She is exercising regularly Will repeat DEXA 2020

## 2017-09-15 NOTE — Assessment & Plan Note (Signed)
Bowel movements have improved with the addition of Metamucil daily She is now having formed stool and typically 1-3 bowel movements a day She still has some abdominal cramping Advised her to increase the Metamucil or try a different fiber to see if that helps her symptoms

## 2017-09-15 NOTE — Assessment & Plan Note (Signed)
Following with endocrine, visits up-to-date Sugars have been very well controlled Management per Endo

## 2017-09-15 NOTE — Assessment & Plan Note (Signed)
Blood pressure elevated here today, but typically better controlled Continue current medications at current doses Recent blood work done by endocrine

## 2017-09-15 NOTE — Patient Instructions (Signed)
  No immunizations administered today.   Medications reviewed and updated.  No changes recommended at this time.    Please followup in 6 months   

## 2017-10-07 ENCOUNTER — Other Ambulatory Visit: Payer: Self-pay | Admitting: Internal Medicine

## 2017-10-07 NOTE — Telephone Encounter (Signed)
[place] Controlled Substance Database checked. Last filled on 09/05/17. Pt states she does not use [facility] any more and it was sent there on 09/15/17

## 2017-10-08 ENCOUNTER — Other Ambulatory Visit: Payer: Self-pay | Admitting: Emergency Medicine

## 2017-10-08 MED ORDER — LOSARTAN POTASSIUM 100 MG PO TABS: 100.0000 mg | ORAL_TABLET | Freq: Every day | ORAL | 3 refills | Status: DC

## 2017-10-20 ENCOUNTER — Other Ambulatory Visit: Payer: Self-pay | Admitting: Internal Medicine

## 2017-10-21 LAB — HM DIABETES EYE EXAM

## 2017-10-28 ENCOUNTER — Encounter: Payer: Self-pay | Admitting: Internal Medicine

## 2017-11-05 ENCOUNTER — Other Ambulatory Visit: Payer: Self-pay | Admitting: Emergency Medicine

## 2017-11-05 MED ORDER — ALPRAZOLAM 0.5 MG PO TABS: ORAL_TABLET | ORAL | 2 refills | Status: DC

## 2017-11-05 NOTE — Telephone Encounter (Signed)
Pt requesting a refill of Xanax.  [place] Controlled Substance Database checked. Last filled on 10/07/17  Copied from [place] [id number]. Topic: Quick Communication - Rx Refill/Question >> Nov 04, 2017  1:25 PM Oliver Pila B wrote: Pt did not disclose what medications she wanted to talk about but pt wants to speak w/ Lovena Le the nurse of Dr. Quay Burow, call pt to advise

## 2017-11-12 ENCOUNTER — Encounter: Payer: Self-pay | Admitting: Internal Medicine

## 2017-12-26 ENCOUNTER — Emergency Department (HOSPITAL_COMMUNITY): Payer: Medicare Other

## 2017-12-26 ENCOUNTER — Other Ambulatory Visit: Payer: Self-pay

## 2017-12-26 ENCOUNTER — Encounter (HOSPITAL_COMMUNITY): Payer: Self-pay | Admitting: Emergency Medicine

## 2017-12-26 ENCOUNTER — Emergency Department (HOSPITAL_COMMUNITY)
Admission: EM | Admit: 2017-12-26 | Discharge: 2017-12-26 | Disposition: A | Discharge status: Home / Self Care | Payer: Medicare Other | Attending: Emergency Medicine | Admitting: Emergency Medicine

## 2017-12-26 DIAGNOSIS — Z87891 Personal history of nicotine dependence: Secondary | ICD-10-CM | POA: Diagnosis not present

## 2017-12-26 DIAGNOSIS — I480 Paroxysmal atrial fibrillation: Secondary | ICD-10-CM | POA: Diagnosis not present

## 2017-12-26 DIAGNOSIS — Z9641 Presence of insulin pump (external) (internal): Secondary | ICD-10-CM | POA: Diagnosis not present

## 2017-12-26 DIAGNOSIS — R002 Palpitations: Secondary | ICD-10-CM | POA: Diagnosis present

## 2017-12-26 DIAGNOSIS — I10 Essential (primary) hypertension: Secondary | ICD-10-CM | POA: Diagnosis not present

## 2017-12-26 DIAGNOSIS — Z7982 Long term (current) use of aspirin: Secondary | ICD-10-CM | POA: Insufficient documentation

## 2017-12-26 DIAGNOSIS — E109 Type 1 diabetes mellitus without complications: Secondary | ICD-10-CM | POA: Insufficient documentation

## 2017-12-26 DIAGNOSIS — Z79899 Other long term (current) drug therapy: Secondary | ICD-10-CM | POA: Diagnosis not present

## 2017-12-26 DIAGNOSIS — R0602 Shortness of breath: Secondary | ICD-10-CM | POA: Insufficient documentation

## 2017-12-26 DIAGNOSIS — Z853 Personal history of malignant neoplasm of breast: Secondary | ICD-10-CM | POA: Diagnosis not present

## 2017-12-26 DIAGNOSIS — E039 Hypothyroidism, unspecified: Secondary | ICD-10-CM | POA: Insufficient documentation

## 2017-12-26 DIAGNOSIS — I4891 Unspecified atrial fibrillation: Secondary | ICD-10-CM

## 2017-12-26 LAB — URINALYSIS, ROUTINE W REFLEX MICROSCOPIC
Bilirubin Urine: NEGATIVE
Glucose, UA: 50 mg/dL — AB
Hgb urine dipstick: NEGATIVE
Ketones, ur: NEGATIVE mg/dL
Leukocytes, UA: NEGATIVE
NITRITE: NEGATIVE
PH: 7 (ref 5.0–8.0)
Protein, ur: NEGATIVE mg/dL
SPECIFIC GRAVITY, URINE: 1.006 (ref 1.005–1.030)

## 2017-12-26 LAB — CBC
HCT: 38.2 % (ref 36.0–46.0)
Hemoglobin: 13 g/dL (ref 12.0–15.0)
MCH: 29.5 pg (ref 26.0–34.0)
MCHC: 34 g/dL (ref 30.0–36.0)
MCV: 86.8 fL (ref 78.0–100.0)
PLATELETS: 270 10*3/uL (ref 150–400)
RBC: 4.4 MIL/uL (ref 3.87–5.11)
RDW: 11.9 % (ref 11.5–15.5)
WBC: 7.4 10*3/uL (ref 4.0–10.5)

## 2017-12-26 LAB — BASIC METABOLIC PANEL
Anion gap: 9 (ref 5–15)
BUN: 12 mg/dL (ref 6–20)
CHLORIDE: 95 mmol/L — AB (ref 101–111)
CO2: 25 mmol/L (ref 22–32)
CREATININE: 0.8 mg/dL (ref 0.44–1.00)
Calcium: 9.9 mg/dL (ref 8.9–10.3)
GFR calc Af Amer: >60 mL/min (ref >60)
GFR calc non Af Amer: >60 mL/min (ref >60)
GLUCOSE: 187 mg/dL — AB (ref 65–99)
Potassium: 3.5 mmol/L (ref 3.5–5.1)
Sodium: 129 mmol/L — ABNORMAL LOW (ref 135–145)

## 2017-12-26 LAB — TSH: TSH: 2.639 u[IU]/mL (ref 0.350–4.500)

## 2017-12-26 LAB — CBG MONITORING, ED: Glucose-Capillary: 198 mg/dL — ABNORMAL HIGH (ref 65–99)

## 2017-12-26 LAB — I-STAT TROPONIN, ED: Troponin i, poc: 0.01 ng/mL (ref 0.00–0.08)

## 2017-12-26 LAB — MAGNESIUM: MAGNESIUM: 1.6 mg/dL — AB (ref 1.7–2.4)

## 2017-12-26 LAB — T4, FREE: FREE T4: 1 ng/dL (ref 0.82–1.77)

## 2017-12-26 MED ORDER — APIXABAN 5 MG PO TABS: 5.0000 mg | ORAL_TABLET | Freq: Two times a day (BID) | ORAL | 0 refills | Status: DC

## 2017-12-26 MED ORDER — APIXABAN 2.5 MG PO TABS: 2.5000 mg | ORAL_TABLET | Freq: Two times a day (BID) | ORAL | Status: DC

## 2017-12-26 MED ORDER — METOPROLOL TARTRATE 25 MG PO TABS: 25.0000 mg | ORAL_TABLET | Freq: Two times a day (BID) | ORAL | 0 refills | Status: DC

## 2017-12-26 MED ORDER — APIXABAN 5 MG PO TABS
5.0000 mg | ORAL_TABLET | Freq: Two times a day (BID) | ORAL | Status: DC
  Administered 2017-12-26: 5 mg via ORAL
  Filled 2017-12-26 (×2): qty 1

## 2017-12-26 MED ORDER — METOPROLOL TARTRATE 5 MG/5ML IV SOLN
5.0000 mg | Freq: Once | INTRAVENOUS | Status: AC
Start: 2017-12-26 — End: 2017-12-26
  Administered 2017-12-26: 5 mg via INTRAVENOUS
  Filled 2017-12-26: qty 5

## 2017-12-26 MED ORDER — METOPROLOL TARTRATE 25 MG PO TABS
25.0000 mg | ORAL_TABLET | Freq: Once | ORAL | Status: AC
  Administered 2017-12-26: 25 mg via ORAL
  Filled 2017-12-26: qty 1

## 2017-12-26 MED ORDER — SODIUM CHLORIDE 0.9 % IV BOLUS
500.0000 mL | Freq: Once | INTRAVENOUS | Status: AC
  Administered 2017-12-26: 500 mL via INTRAVENOUS

## 2017-12-26 MED ORDER — MAGNESIUM SULFATE IN D5W 1-5 GM/100ML-% IV SOLN
1.0000 g | Freq: Once | INTRAVENOUS | Status: AC
  Administered 2017-12-26: 1 g via INTRAVENOUS
  Filled 2017-12-26: qty 100

## 2017-12-26 NOTE — Discharge Instructions (Addendum)
Information on my medicine - ELIQUIS (apixaban)  This medication education was reviewed with me or my healthcare representative as part of my discharge preparation.  The pharmacist that spoke with me during my hospital stay was:  Norva Riffle, [facility]  WHY WAS ELIQUIS PRESCRIBED FOR YOU? Eliquis was prescribed for you to reduce the risk of a blood clot forming that can cause a stroke if you have a medical condition called atrial fibrillation (a type of irregular heartbeat).  WHAT DO YOU NEED TO KNOW ABOUT ELIQUIS ? Take your Eliquis TWICE DAILY - one tablet in the morning and one tablet in the evening with or without food. If you have difficulty swallowing the tablet whole please discuss with your pharmacist how to take the medication safely.  Take Eliquis exactly as prescribed by your doctor and DO NOT stop taking Eliquis without talking to the doctor who prescribed the medication.  Stopping may increase your risk of developing a stroke.  Refill your prescription before you run out.  After discharge, you should have regular check-up appointments with your healthcare provider that is prescribing your Eliquis.  In the future your dose may need to be changed if your kidney function or weight changes by a significant amount or as you get older.  WHAT DO YOU DO IF YOU MISS A DOSE? If you miss a dose, take it as soon as you remember on the same day and resume taking twice daily.  Do not take more than one dose of ELIQUIS at the same time to make up a missed dose.  IMPORTANT SAFETY INFORMATION A possible side effect of Eliquis is bleeding. You should call your healthcare provider right away if you experience any of the following: Bleeding from an injury or your nose that does not stop. Unusual colored urine (red or dark brown) or unusual colored stools (red or black). Unusual bruising for unknown reasons. A serious fall or if you hit your head (even if there is no bleeding).  Some  medicines may interact with Eliquis and might increase your risk of bleeding or clotting while on Eliquis. To help avoid this, consult your healthcare provider or pharmacist prior to using any new prescription or non-prescription medications, including herbals, vitamins, non-steroidal anti-inflammatory drugs (NSAIDs) and supplements.  This website has more information on Eliquis (apixaban): http://www.eliquis.com/eliquis/home

## 2017-12-26 NOTE — ED Provider Notes (Signed)
[facility] EMERGENCY DEPARTMENT Provider Note   CSN: [id number] Arrival date & time: 12/26/17  9381     History   Chief Complaint Chief Complaint  Patient presents with  . Palpitations    HPI [name] is a 84 y.o. female.  HPI Patient presents with acute onset irregular palpitations starting yesterday evening at 9:30 PM.  Initially had some mild shortness of breath which is resolved.  Denies chest pain.  No new lower extremity swelling or pain.  No recent illnesses.  States that her primary physician increased her Synthroid dosing last month. Past Medical History:  Diagnosis Date  . Arthritis    fingers  . Cancer of upper-outer quadrant of female breast ([place]) 08/21/2011   ER +  PR +  Her 2 -  Ki67 9%   0.9 cm invasive lobular  Carcinoma ,s/p central lumpectomy with sentinel node biopsy,  ER/PR positive s/p re-excion on 09/16/11 with final pathology showing atypical hyperplasia   . CAP (community acquired pneumonia) 12/06/2016  . Diabetes mellitus    type wears insulin pump  . Diabetes mellitus type 1 (HCC)    type I- wears insulin pump  . GERD (gastroesophageal reflux disease)   . Hypercholesteremia   . Hypertension   . Hyponatremia   . Hypothyroid   . Hypothyroidism   . Osteoarthritis   . Osteoporosis, post-menopausal    DEXA 12/02/11: -3.5 (with lomax gyn), declines bisphos due to bone pain  . PAF (paroxysmal atrial fibrillation) ([place])    One episode in 2008, spontaneously converted in the ED, no further treatment  . Spondylosis    thoracic spine  . Thyroid nodule dx 07/2011   [place] q 2moto follow calcified L thyroid nodule (12/08/11 [place]    Patient Active Problem List   Diagnosis Date Noted  . Leg cramps 07/05/2017  . Constipation 01/25/2017  . Closed fracture of proximal end of right humerus 07/09/2016  . Cough 06/17/2016  . AP (abdominal pain) 03/10/2016  . Hearing loss 01/03/2016  . Allergic rhinitis 01/03/2016  . Minimal Coronary Plaque  by Cardiac Cath in 2013 09/11/2015  . Palpitations 09/06/2015  . Frequent loose stools 08/28/2015  . Hyponatremia   . Acute mesenteric ischemia ([place]   . Hypothyroidism 07/11/2012  . Syncope 03/18/2012  . Abnormal LFTs   . Hypercholesteremia   . Hypertension   . Diabetes mellitus type 1 ([place]   . Osteoarthritis   . Thyroid nodule   . Osteoporosis, post-menopausal   . Primary cancer of upper outer quadrant of left female breast ([place] 08/21/2011    Past Surgical History:  Procedure Laterality Date  . ABDOMINAL HYSTERECTOMY    . ABDOMINAL HYSTERECTOMY  yrs ago  . APPENDECTOMY    . APPENDECTOMY  age 84 . BLADDER REPAIR  5 yrs ago  . BREAST LUMPECTOMY    . BREAST SURGERY     biopsy  . BREAST SURGERY   yrs ago   br bx  . BREAST SURGERY  01/ 23/2013   left central lumpectomy and snbx, re-excision lumpectomy  . CARDIOVASCULAR STRESS TEST  03/17/2012  . LEFT HEART CATHETERIZATION WITH CORONARY ANGIOGRAM N/A 03/18/2012   Procedure: LEFT HEART CATHETERIZATION WITH CORONARY ANGIOGRAM;  Surgeon: JMinus Breeding MD;  [facility];  Service: Cardiovascular;  Laterality: N/A;  . TONSILLECTOMY   age 84    OB History   None      Home Medications    Prior to Admission  medications   Medication Sig Start Date End Date Taking? Authorizing Provider  ALPRAZolam Duanne Moron) 0.5 MG tablet TAKE 1/2 TABLET TWICE DAILY AS NEEDED FOR ANXIETY OR SLEEP 11/05/17  Yes Burns, Claudina Lick, MD  aspirin 81 MG tablet Take 81 mg by mouth at bedtime.    Yes [provider]  B Complex-C (SUPER B COMPLEX PO) Take 1 tablet by mouth daily.   Yes [provider]  carboxymethylcellulose (REFRESH PLUS) 0.5 % SOLN Place 1 drop into both eyes 3 (three) times daily as needed. Non-preservative   Yes [provider]  cholecalciferol (VITAMIN D) 1000 UNITS tablet Take 5,000 Units by mouth daily.    Yes [provider]  fish oil-omega-3 fatty acids 1000 MG capsule Take 2 g by mouth  daily.   Yes [provider]  hydrochlorothiazide (MICROZIDE) 12.5 MG capsule Take 12.5 mg by mouth every other day.    Yes [provider]  Insulin Glargine (LANTUS SOLOSTAR) 100 UNIT/ML Solostar Pen Inject 14 Units into the skin daily at 10 pm.  07/29/14  Yes [provider]  insulin lispro (HUMALOG KWIKPEN) 100 UNIT/ML KiwkPen Inject into the skin. Sliding scale before meal   Yes [provider]  losartan (COZAAR) 100 MG tablet Take 1 tablet (100 mg total) by mouth daily. 10/08/17  Yes Burns, Claudina Lick, MD  Multiple Vitamin (MULTIVITAMIN) tablet Take 1 tablet by mouth daily. For breast and bone health   Yes [provider]  simvastatin (ZOCOR) 20 MG tablet Take 20 mg by mouth at bedtime.    Yes [provider]  SYNTHROID 100 MCG tablet Take 1 tablet (100 mcg total) by mouth daily before breakfast. 09/15/17  Yes Burns, Claudina Lick, MD  vitamin C (ASCORBIC ACID) 500 MG tablet Take 1,000 mg by mouth daily. chewable   Yes [provider]  vitamin E 400 UNIT capsule Take 400 Units by mouth daily.   Yes [provider]  apixaban (ELIQUIS) 5 MG TABS tablet Take 1 tablet (5 mg total) by mouth 2 (two) times daily. 12/26/17   Julianne Rice, MD  BD INSULIN SYRINGE ULTRAFINE 31G X 15/64" 0.3 ML MISC USE WITH INJECTIONS Patient not taking: Reported on 12/26/2017 01/18/17   Binnie Rail, MD  hydrochlorothiazide (HYDRODIURIL) 25 MG tablet TAKE 1 TABLET ONCE DAILY. Patient not taking: Reported on 12/26/2017 10/20/17   Binnie Rail, MD  metoprolol tartrate (LOPRESSOR) 25 MG tablet Take 1 tablet (25 mg total) by mouth 2 (two) times daily. 12/26/17   Julianne Rice, MD  ranitidine (ZANTAC) 150 MG tablet Take 1 tablet (150 mg total) by mouth at bedtime. Patient not taking: Reported on 12/26/2017 03/17/17   Binnie Rail, MD    Family History Family History  Problem Relation Age of Onset  . Cancer Sister        unknown  . Hypertension Mother   .  Stroke Mother   . Arthritis Father   . Heart disease Father   . Diabetes Son     Social History Social History   Tobacco Use  . Smoking status: Former Smoker    Packs/day: 1.00    Years: 10.00    Pack years: 10.00    Types: Cigarettes    Last attempt to quit: 08/27/1977    Years since quitting: 40.3  . Smokeless tobacco: Never Used  Substance Use Topics  . Alcohol use: Yes    Comment: rare  . Drug use: No  Allergies   Amlodipine; Augmentin [amoxicillin-pot clavulanate]; Cortisone; Keflex [cephalexin]; Omeprazole; Prednisone; Hydrocodone; and Latex   Review of Systems Review of Systems  Constitutional: Negative for chills and fever.  Eyes: Negative for visual disturbance.  Respiratory: Positive for shortness of breath. Negative for cough and wheezing.   Cardiovascular: Positive for palpitations. Negative for chest pain and leg swelling.  Gastrointestinal: Negative for abdominal pain, nausea and vomiting.  Genitourinary: Negative for dysuria, flank pain, frequency and hematuria.  Musculoskeletal: Negative for back pain, myalgias and neck pain.  Skin: Negative for rash and wound.  Neurological: Negative for dizziness, weakness, light-headedness, numbness and headaches.  All other systems reviewed and are negative.    Physical Exam Updated Vital Signs BP 129/81   Pulse 74   Temp 98.1 F (36.7 C) (Oral)   Resp 18   Ht '5\' 7"'$  (1.702 m)   Wt 63 kg (139 lb)   SpO2 97%   BMI 21.77 kg/m   Physical Exam  Constitutional: She is oriented to person, place, and time. She appears well-developed and well-nourished.  HENT:  Head: Normocephalic and atraumatic.  Mouth/Throat: Oropharynx is clear and moist. No oropharyngeal exudate.  Eyes: Pupils are equal, round, and reactive to light. EOM are normal.  Neck: Normal range of motion. Neck supple. No thyromegaly present.  Cardiovascular:  Irregularly irregular  Pulmonary/Chest: Effort normal and breath sounds normal.    Abdominal: Soft. Bowel sounds are normal. There is no tenderness. There is no rebound and no guarding.  Musculoskeletal: Normal range of motion. She exhibits no edema or tenderness.  No midline thoracic lumbar tenderness.  No CVA tenderness.  No lower extremity swelling, asymmetry or tenderness.  Distal pulses 2+.  Lymphadenopathy:    She has no cervical adenopathy.  Neurological: She is alert and oriented to person, place, and time.  Moves all extremities without focal deficit.  Sensation fully intact.  Skin: Skin is warm and dry. No rash noted. No erythema.  Psychiatric: She has a normal mood and affect. Her behavior is normal.  Nursing note and vitals reviewed.    ED Treatments / Results  Labs (all labs ordered are listed, but only abnormal results are displayed) Labs Reviewed  BASIC METABOLIC PANEL - Abnormal; Notable for the following components:      Result Value   Sodium 129 (*)    Chloride 95 (*)    Glucose, Bld 187 (*)    All other components within normal limits  URINALYSIS, ROUTINE W REFLEX MICROSCOPIC - Abnormal; Notable for the following components:   Color, Urine STRAW (*)    Glucose, UA 50 (*)    All other components within normal limits  MAGNESIUM - Abnormal; Notable for the following components:   Magnesium 1.6 (*)    All other components within normal limits  CBG MONITORING, ED - Abnormal; Notable for the following components:   Glucose-Capillary 198 (*)    All other components within normal limits  CBC  TSH  T4, FREE  I-STAT TROPONIN, ED    EKG EKG Interpretation  Date/Time:  Sunday Dec 26 2017 07:39:07 EDT Ventricular Rate:  96 PR Interval:    QRS Duration: 82 QT Interval:  364 QTC Calculation: 459 R Axis:   64 Text Interpretation:  Atrial fibrillation Septal infarct , age undetermined Abnormal ECG Confirmed by Elnora Morrison [phone]), editor Philomena Doheny [phone]) on 12/26/2017 8:53:37 AM Also confirmed by Julianne Rice [phone])  on 12/26/2017  11:00:18 AM   Radiology Dg Chest 2 View  Result Date: 12/26/2017 CLINICAL DATA:  Palpitations. EXAM: CHEST - 2 VIEW COMPARISON:  Chest x-ray dated May 10, 2017. FINDINGS: The heart size and mediastinal contours are within normal limits. Normal pulmonary vascularity. Atherosclerotic calcification of the aortic arch. Chronically coarsened interstitial markings are similar to prior studies. No focal consolidation, pleural effusion, or pneumothorax. No acute osseous abnormality. Unchanged surgical clips in the left breast. IMPRESSION: No active cardiopulmonary disease. Electronically Signed   By: Titus Dubin M.D.   On: 12/26/2017 09:26    Procedures Procedures (including critical care time)  Medications Ordered in ED Medications  magnesium sulfate IVPB 1 g 100 mL (has no administration in time range)  metoprolol tartrate (LOPRESSOR) tablet 25 mg (has no administration in time range)  apixaban (ELIQUIS) tablet 5 mg (has no administration in time range)  metoprolol tartrate (LOPRESSOR) injection 5 mg (5 mg Intravenous Given 12/26/17 1143)  sodium chloride 0.9 % bolus 500 mL (0 mLs Intravenous Stopped 12/26/17 1300)      Initial Impression / Assessment and Plan / ED Course  I have reviewed the triage vital signs and the nursing notes.  Pertinent labs & imaging results that were available during my care of the patient were reviewed by me and considered in my medical decision making (see chart for details).    Patient states he is feeling much better after IV metoprolol.  Continues to deny any chest pain.  Discussed with Dr. Caryl Comes.  Recommend starting patient on metoprolol, anticoagulants and follow-up in A. fib clinic.  CHA2DS2VASC = 5  Given first dose of Eliquis and p.o. metoprolol in the emergency department.  Pharmacy educated patient on taking Eliquis.  Understands need to follow-up in the atrial fibrillation clinic.  Strict return precautions have been given. Final Clinical  Impressions(s) / ED Diagnoses   Final diagnoses:  New onset atrial fibrillation [facility])    ED Discharge Orders        Ordered    Amb referral to AFIB Clinic     12/26/17 1340    apixaban (ELIQUIS) 5 MG TABS tablet  2 times daily     12/26/17 1455    metoprolol tartrate (LOPRESSOR) 25 MG tablet  2 times daily     12/26/17 1455       Julianne Rice, MD 12/26/17 1456

## 2017-12-26 NOTE — ED Notes (Signed)
Pt requested CBG check.

## 2017-12-26 NOTE — ED Triage Notes (Signed)
Pt. Stated, I was watching a movie and felt my heart just flipping, and on through the night.

## 2017-12-27 ENCOUNTER — Telehealth: Payer: Self-pay | Admitting: *Deleted

## 2017-12-27 NOTE — Telephone Encounter (Signed)
Pt was on TCM report went ED for new onset AF. Pt will be f/u w/cardioogy.Marland KitchenJohny Chess

## 2017-12-28 ENCOUNTER — Other Ambulatory Visit: Payer: Self-pay | Admitting: Adult Health

## 2017-12-28 DIAGNOSIS — Z1231 Encounter for screening mammogram for malignant neoplasm of breast: Secondary | ICD-10-CM

## 2017-12-30 ENCOUNTER — Encounter (HOSPITAL_COMMUNITY): Payer: Self-pay | Admitting: Nurse Practitioner

## 2017-12-30 ENCOUNTER — Ambulatory Visit (HOSPITAL_COMMUNITY)
Admission: RE | Admit: 2017-12-30 | Discharge: 2017-12-30 | Disposition: A | Discharge status: Home / Self Care | Payer: Medicare Other | Source: Ambulatory Visit | Attending: Nurse Practitioner | Admitting: Nurse Practitioner

## 2017-12-30 VITALS — BP 140/76 | HR 77 | Ht 67.0 in | Wt 137.0 lb

## 2017-12-30 DIAGNOSIS — Z880 Allergy status to penicillin: Secondary | ICD-10-CM | POA: Diagnosis not present

## 2017-12-30 DIAGNOSIS — Z7901 Long term (current) use of anticoagulants: Secondary | ICD-10-CM | POA: Insufficient documentation

## 2017-12-30 DIAGNOSIS — I480 Paroxysmal atrial fibrillation: Secondary | ICD-10-CM | POA: Diagnosis not present

## 2017-12-30 DIAGNOSIS — Z794 Long term (current) use of insulin: Secondary | ICD-10-CM | POA: Diagnosis not present

## 2017-12-30 DIAGNOSIS — Z79890 Hormone replacement therapy (postmenopausal): Secondary | ICD-10-CM | POA: Insufficient documentation

## 2017-12-30 DIAGNOSIS — M1990 Unspecified osteoarthritis, unspecified site: Secondary | ICD-10-CM | POA: Diagnosis not present

## 2017-12-30 DIAGNOSIS — R002 Palpitations: Secondary | ICD-10-CM | POA: Diagnosis present

## 2017-12-30 DIAGNOSIS — Z9641 Presence of insulin pump (external) (internal): Secondary | ICD-10-CM | POA: Diagnosis not present

## 2017-12-30 DIAGNOSIS — Z881 Allergy status to other antibiotic agents status: Secondary | ICD-10-CM | POA: Diagnosis not present

## 2017-12-30 DIAGNOSIS — E109 Type 1 diabetes mellitus without complications: Secondary | ICD-10-CM | POA: Insufficient documentation

## 2017-12-30 DIAGNOSIS — Z91040 Latex allergy status: Secondary | ICD-10-CM | POA: Insufficient documentation

## 2017-12-30 DIAGNOSIS — Z853 Personal history of malignant neoplasm of breast: Secondary | ICD-10-CM | POA: Diagnosis not present

## 2017-12-30 DIAGNOSIS — Z888 Allergy status to other drugs, medicaments and biological substances status: Secondary | ICD-10-CM | POA: Insufficient documentation

## 2017-12-30 DIAGNOSIS — K219 Gastro-esophageal reflux disease without esophagitis: Secondary | ICD-10-CM | POA: Diagnosis not present

## 2017-12-30 DIAGNOSIS — M810 Age-related osteoporosis without current pathological fracture: Secondary | ICD-10-CM | POA: Insufficient documentation

## 2017-12-30 DIAGNOSIS — I10 Essential (primary) hypertension: Secondary | ICD-10-CM | POA: Diagnosis not present

## 2017-12-30 DIAGNOSIS — Z90710 Acquired absence of both cervix and uterus: Secondary | ICD-10-CM | POA: Insufficient documentation

## 2017-12-30 DIAGNOSIS — E039 Hypothyroidism, unspecified: Secondary | ICD-10-CM | POA: Insufficient documentation

## 2017-12-30 DIAGNOSIS — Z885 Allergy status to narcotic agent status: Secondary | ICD-10-CM | POA: Insufficient documentation

## 2017-12-30 DIAGNOSIS — Z87891 Personal history of nicotine dependence: Secondary | ICD-10-CM | POA: Insufficient documentation

## 2017-12-30 DIAGNOSIS — E7800 Pure hypercholesterolemia, unspecified: Secondary | ICD-10-CM | POA: Diagnosis not present

## 2017-12-30 DIAGNOSIS — Z79899 Other long term (current) drug therapy: Secondary | ICD-10-CM | POA: Diagnosis not present

## 2017-12-30 NOTE — Progress Notes (Signed)
Primary Care Physician: Binnie Rail, MD Referring Physician: [facility] ER f/u   [name] is a 84 y.o. female with a h/o palpitations, DM, HTN, that developed palpitations Sunday PM while watching  TV with her husband. She was found to be in afib. Synthroid was increased last month by PCP. She was placed on BB and eliquis after ER MD talked to Dr. Caryl Comes and arranged for f/u in afib clinic. Pt has seen Dr. Lovena Le in the past for palpitations and has f/u with him in June. She is in SR  today and felt that she converted later that night on returning home. Triggers reviewed and drinks minimal alcohol, minimal caffeine, mouth breather at night but no significant snoring. Since starting Eliquis, she bleeds more from her fingertips with her blood sugar checks. She is on Vit E and fish oil, started on her own, which may be contributing to easier bleeding.  Today, she denies symptoms of palpitations, chest pain, shortness of breath, orthopnea, PND, lower extremity edema, dizziness, presyncope, syncope, or neurologic sequela. The patient is tolerating medications without difficulties and is otherwise without complaint today.   Past Medical History:  Diagnosis Date  . Arthritis    fingers  . Cancer of upper-outer quadrant of female breast ([place]) 08/21/2011   ER +  PR +  Her 2 -  Ki67 9%   0.9 cm invasive lobular  Carcinoma ,s/p central lumpectomy with sentinel node biopsy,  ER/PR positive s/p re-excion on 09/16/11 with final pathology showing atypical hyperplasia   . CAP (community acquired pneumonia) 12/06/2016  . Diabetes mellitus    type wears insulin pump  . Diabetes mellitus type 1 (HCC)    type I- wears insulin pump  . GERD (gastroesophageal reflux disease)   . Hypercholesteremia   . Hypertension   . Hyponatremia   . Hypothyroid   . Hypothyroidism   . Osteoarthritis   . Osteoporosis, post-menopausal    DEXA 12/02/11: -3.5 (with lomax gyn), declines bisphos due to bone pain  . PAF (paroxysmal  atrial fibrillation) ([place])    One episode in 2008, spontaneously converted in the ED, no further treatment  . Spondylosis    thoracic spine  . Thyroid nodule dx 07/2011   [place] q 7moto follow calcified L thyroid nodule (12/08/11 [place]   Past Surgical History:  Procedure Laterality Date  . ABDOMINAL HYSTERECTOMY    . ABDOMINAL HYSTERECTOMY  yrs ago  . APPENDECTOMY    . APPENDECTOMY  age 84 . BLADDER REPAIR  5 yrs ago  . BREAST LUMPECTOMY    . BREAST SURGERY     biopsy  . BREAST SURGERY   yrs ago   br bx  . BREAST SURGERY  01/ 23/2013   left central lumpectomy and snbx, re-excision lumpectomy  . CARDIOVASCULAR STRESS TEST  03/17/2012  . LEFT HEART CATHETERIZATION WITH CORONARY ANGIOGRAM N/A 03/18/2012   Procedure: LEFT HEART CATHETERIZATION WITH CORONARY ANGIOGRAM;  Surgeon: JMinus Breeding MD;  [facility];  Service: Cardiovascular;  Laterality: N/A;  . TONSILLECTOMY   age 84   Current Outpatient Medications  Medication Sig Dispense Refill  . ALPRAZolam (XANAX) 0.5 MG tablet TAKE 1/2 TABLET TWICE DAILY AS NEEDED FOR ANXIETY OR SLEEP 30 tablet 2  . apixaban (ELIQUIS) 5 MG TABS tablet Take 1 tablet (5 mg total) by mouth 2 (two) times daily. 60 tablet 0  . B Complex-C (SUPER B COMPLEX PO) Take 1 tablet by mouth daily.    .[name]  BD INSULIN SYRINGE ULTRAFINE 31G X 15/64" 0.3 ML MISC USE WITH INJECTIONS 100 each 3  . carboxymethylcellulose (REFRESH PLUS) 0.5 % SOLN Place 1 drop into both eyes 3 (three) times daily as needed. Non-preservative    . cholecalciferol (VITAMIN D) 1000 UNITS tablet Take 5,000 Units by mouth daily.     . fish oil-omega-3 fatty acids 1000 MG capsule Take 2 g by mouth daily.    . hydrochlorothiazide (MICROZIDE) 12.5 MG capsule Take 12.5 mg by mouth every other day.     . Insulin Glargine (LANTUS SOLOSTAR) 100 UNIT/ML Solostar Pen Inject 14 Units into the skin daily at 10 pm.     . insulin lispro (HUMALOG KWIKPEN) 100 UNIT/ML KiwkPen Inject into the skin. Sliding  scale before meal    . losartan (COZAAR) 100 MG tablet Take 1 tablet (100 mg total) by mouth daily. 90 tablet 3  . metoprolol succinate (TOPROL-XL) 25 MG 24 hr tablet Take 12.5 mg by mouth as needed.    . Multiple Vitamin (MULTIVITAMIN) tablet Take 1 tablet by mouth daily. For breast and bone health    . ranitidine (ZANTAC) 150 MG tablet Take 1 tablet (150 mg total) by mouth at bedtime. 90 tablet 1  . simvastatin (ZOCOR) 20 MG tablet Take 20 mg by mouth at bedtime.     [name] SYNTHROID 100 MCG tablet Take 1 tablet (100 mcg total) by mouth daily before breakfast.     No current facility-administered medications for this encounter.     Allergies  Allergen Reactions  . Amlodipine     Dizzy, nausea  . Augmentin [Amoxicillin-Pot Clavulanate] Other (See Comments)    Gi upset only no rash or hives   . Cortisone Other (See Comments)    Makes blood sugars run high  . Keflex [Cephalexin] Hives and Nausea Only    Stomach upset  . Omeprazole Nausea Only    Dizziness and nausea  . Prednisone     Other reaction(s): Other Makes blood sugars run high  . Hydrocodone Rash    Rash to chest , side back  . Latex Rash    Powder in gloves causes rash; "not allergic to latex just the powder"    Social History   Socioeconomic History  . Marital status: Married    Spouse name: Not on file  . Number of children: Not on file  . Years of education: Not on file  . Highest education level: Not on file  Occupational History  . Not on file  Social Needs  . Financial resource strain: Not on file  . Food insecurity:    Worry: Not on file    Inability: Not on file  . Transportation needs:    Medical: Not on file    Non-medical: Not on file  Tobacco Use  . Smoking status: Former Smoker    Packs/day: 1.00    Years: 10.00    Pack years: 10.00    Types: Cigarettes    Last attempt to quit: 08/27/1977    Years since quitting: 40.3  . Smokeless tobacco: Never Used  Substance and Sexual Activity  .  Alcohol use: Yes    Comment: rare  . Drug use: No  . Sexual activity: Yes    Birth control/protection: Post-menopausal  Lifestyle  . Physical activity:    Days per week: Not on file    Minutes per session: Not on file  . Stress: Not on file  Relationships  . Social connections:  Talks on phone: Not on file    Gets together: Not on file    Attends religious service: Not on file    Active member of club or organization: Not on file    Attends meetings of clubs or organizations: Not on file    Relationship status: Not on file  . Intimate partner violence:    Fear of current or ex partner: Not on file    Emotionally abused: Not on file    Physically abused: Not on file    Forced sexual activity: Not on file  Other Topics Concern  . Not on file  Social History Narrative   ** Merged History Encounter **        Family History  Problem Relation Age of Onset  . Cancer Sister        unknown  . Hypertension Mother   . Stroke Mother   . Arthritis Father   . Heart disease Father   . Diabetes Son     ROS- All systems are reviewed and negative except as per the HPI above  Physical Exam: Vitals:   12/30/17 1342  BP: 140/76  Pulse: 77  Weight: 137 lb (62.1 kg)  Height: 5' 7" (1.702 m)   Wt Readings from Last 3 Encounters:  12/30/17 137 lb (62.1 kg)  12/26/17 139 lb (63 kg)  09/15/17 138 lb (62.6 kg)    Labs: Lab Results  Component Value Date   NA 129 (L) 12/26/2017   K 3.5 12/26/2017   CL 95 (L) 12/26/2017   CO2 25 12/26/2017   GLUCOSE 187 (H) 12/26/2017   BUN 12 12/26/2017   CREATININE 0.80 12/26/2017   CALCIUM 9.9 12/26/2017   MG 1.6 (L) 12/26/2017   Lab Results  Component Value Date   INR 1.16 03/18/2012   Lab Results  Component Value Date   CHOL 176 03/22/2017   HDL 41.80 03/22/2017   LDLCALC 114 (H) 03/22/2017   TRIG 102.0 03/22/2017     GEN- The patient is well appearing, alert and oriented x 3 today.   Head- normocephalic, atraumatic Eyes-   Sclera clear, conjunctiva pink Ears- hearing intact Oropharynx- clear Neck- supple, no JVP Lymph- no cervical lymphadenopathy Lungs- Clear to ausculation bilaterally, normal work of breathing Heart- Regular rate and rhythm, no murmurs, rubs or gallops, PMI not laterally displaced GI- soft, NT, ND, + BS Extremities- no clubbing, cyanosis, or edema MS- no significant deformity or atrophy Skin- no rash or lesion Psych- euthymic mood, full affect Neuro- strength and sensation are intact  EKG-NSR at 77 bpm, pr int 174 ms, qrs int 78 ms, qtc 457 ms Echo- 2017-Study Conclusions  - Left ventricle: The cavity size was normal. Systolic function was   vigorous. The estimated ejection fraction was in the range of 65%   to 70%. Wall motion was normal; there were no regional wall   motion abnormalities. Doppler parameters are consistent with   abnormal left ventricular relaxation (grade 1 diastolic   dysfunction). Doppler parameters are consistent with elevated   ventricular end-diastolic filling pressure. - Aortic valve: Trileaflet; mildly thickened, mildly calcified   leaflets. Transvalvular velocity was within the normal range.   There was no stenosis. There was no regurgitation. - Aortic root: The aortic root was normal in size. - Mitral valve: Structurally normal valve. - Left atrium: The atrium was normal in size. - Right ventricle: The cavity size was normal. Wall thickness was   normal. Systolic function was normal. - Tricuspid  valve: There was mild regurgitation. - Pulmonary arteries: The main pulmonary artery was normal-sized.   Systolic pressure was mildly increased. PA peak pressure: 33 mm   Hg (S). - Inferior vena cava: The vessel was normal in size. - Pericardium, extracardiac: There was no pericardial effusion.    Assessment and Plan: 1. Paroxysmal afib  Now back in SR, self converted General education re afib  Continue daily metoprolol  Triggers discussed  2.  Chadsvasc score of  at least 5 Continue eliquis 5 mg bid Discussed with PharmD use of Vit E and fish oil, both can thin the blood and pt is agreeable to stop Denies bleeding history Bleeding precautions discussed  F/u with Dr. Lovena Le as scheduled 6/12  Geroge Baseman. [place], [facility] [address] [place], [place] [address] [phone]

## 2018-01-12 ENCOUNTER — Other Ambulatory Visit: Payer: Self-pay | Admitting: Internal Medicine

## 2018-01-18 ENCOUNTER — Ambulatory Visit: Payer: Medicare Other

## 2018-01-19 ENCOUNTER — Ambulatory Visit: Payer: Medicare Other | Admitting: Internal Medicine

## 2018-01-19 ENCOUNTER — Encounter: Payer: Self-pay | Admitting: Internal Medicine

## 2018-01-19 DIAGNOSIS — R002 Palpitations: Secondary | ICD-10-CM

## 2018-01-19 DIAGNOSIS — I480 Paroxysmal atrial fibrillation: Secondary | ICD-10-CM

## 2018-01-19 DIAGNOSIS — M79604 Pain in right leg: Secondary | ICD-10-CM | POA: Diagnosis not present

## 2018-01-19 DIAGNOSIS — M79605 Pain in left leg: Secondary | ICD-10-CM

## 2018-01-19 DIAGNOSIS — I10 Essential (primary) hypertension: Secondary | ICD-10-CM

## 2018-01-19 MED ORDER — METOPROLOL SUCCINATE ER 25 MG PO TB24: 12.5000 mg | ORAL_TABLET | Freq: Every day | ORAL | 3 refills | Status: DC

## 2018-01-19 MED ORDER — RIVAROXABAN 20 MG PO TABS: 20.0000 mg | ORAL_TABLET | Freq: Every day | ORAL | 11 refills | Status: DC

## 2018-01-19 NOTE — Progress Notes (Addendum)
HPI [name] returns today for followup of atrial fib. She is a pleasant 84 yo woman with non-cardiac chest pain, HTN and palpitations who was found to have PAF and placed on medical therapy with low dose beta blocker and Eliquis. She is intolerant of the eliquis experiencing HA. Her husband who takes xarelto has done well on this medication. She denies palpitations currently. Her blood pressure is up a bit. She complains of pain in her legs.  Allergies  Allergen Reactions  . Eliquis [Apixaban] Other (See Comments)    Dizziness, severe headach  . Amlodipine     Dizzy, nausea  . Augmentin [Amoxicillin-Pot Clavulanate] Other (See Comments)    Gi upset only no rash or hives   . Cortisone Other (See Comments)    Makes blood sugars run high  . Keflex [Cephalexin] Hives and Nausea Only    Stomach upset  . Omeprazole Nausea Only    Dizziness and nausea  . Prednisone     Other reaction(s): Other Makes blood sugars run high  . Hydrocodone Rash    Rash to chest , side back  . Latex Rash    Powder in gloves causes rash; "not allergic to latex just the powder"     Current Outpatient Medications  Medication Sig Dispense Refill  . ALPRAZolam (XANAX) 0.5 MG tablet TAKE 1/2 TABLET TWICE DAILY AS NEEDED FOR ANXIETY OR SLEEP 30 tablet 2  . aspirin EC 81 MG tablet Take 81 mg by mouth daily.    . B Complex-C (SUPER B COMPLEX PO) Take 1 tablet by mouth daily.    . carboxymethylcellulose (REFRESH PLUS) 0.5 % SOLN Place 1 drop into both eyes 3 (three) times daily as needed. Non-preservative    . cholecalciferol (VITAMIN D) 1000 UNITS tablet Take 5,000 Units by mouth daily.     . fish oil-omega-3 fatty acids 1000 MG capsule Take 2 g by mouth daily.    . hydrochlorothiazide (MICROZIDE) 12.5 MG capsule Take 12.5 mg by mouth every other day.     . Insulin Glargine (LANTUS SOLOSTAR) 100 UNIT/ML Solostar Pen Inject 14 Units into the skin daily at 10 pm.     . insulin lispro (HUMALOG KWIKPEN) 100  UNIT/ML KiwkPen Inject into the skin. Sliding scale before meal    . losartan (COZAAR) 100 MG tablet Take 1 tablet (100 mg total) by mouth daily. 90 tablet 3  . Multiple Vitamin (MULTIVITAMIN) tablet Take 1 tablet by mouth daily. For breast and bone health    . ranitidine (ZANTAC) 150 MG tablet Take 1 tablet (150 mg total) by mouth at bedtime. 90 tablet 1  . simvastatin (ZOCOR) 20 MG tablet Take 20 mg by mouth at bedtime.     [name] SYNTHROID 100 MCG tablet Take 1 tablet (100 mcg total) by mouth daily before breakfast.    . BD VEO INSULIN SYRINGE U/F 31G X 15/64" 0.3 ML MISC USE WITH INJECTIONS 100 each 0  . metoprolol succinate (TOPROL XL) 25 MG 24 hr tablet Take 0.5 tablets (12.5 mg total) by mouth daily. 45 tablet 3  . rivaroxaban (XARELTO) 20 MG TABS tablet Take 1 tablet (20 mg total) by mouth daily with supper. 30 tablet 11   No current facility-administered medications for this visit.      Past Medical History:  Diagnosis Date  . Arthritis    fingers  . Cancer of upper-outer quadrant of female breast ([place]) 08/21/2011   ER +  PR +  Her 2 -  Ki67 9%   0.9 cm invasive lobular  Carcinoma ,s/p central lumpectomy with sentinel node biopsy,  ER/PR positive s/p re-excion on 09/16/11 with final pathology showing atypical hyperplasia   . CAP (community acquired pneumonia) 12/06/2016  . Diabetes mellitus    type wears insulin pump  . Diabetes mellitus type 1 (HCC)    type I- wears insulin pump  . GERD (gastroesophageal reflux disease)   . Hypercholesteremia   . Hypertension   . Hyponatremia   . Hypothyroid   . Hypothyroidism   . Osteoarthritis   . Osteoporosis, post-menopausal    DEXA 12/02/11: -3.5 (with lomax gyn), declines bisphos due to bone pain  . PAF (paroxysmal atrial fibrillation) ([place])    One episode in 2008, spontaneously converted in the ED, no further treatment  . Spondylosis    thoracic spine  . Thyroid nodule dx 07/2011   [place] q 104moto follow calcified L thyroid nodule (12/08/11  [place]    ROS:   All systems reviewed and negative except as noted in the HPI.   Past Surgical History:  Procedure Laterality Date  . ABDOMINAL HYSTERECTOMY    . ABDOMINAL HYSTERECTOMY  yrs ago  . APPENDECTOMY    . APPENDECTOMY  age [id number] . BLADDER REPAIR  5 yrs ago  . BREAST LUMPECTOMY    . BREAST SURGERY     biopsy  . BREAST SURGERY   yrs ago   br bx  . BREAST SURGERY  01/ 23/2013   left central lumpectomy and snbx, re-excision lumpectomy  . CARDIOVASCULAR STRESS TEST  03/17/2012  . LEFT HEART CATHETERIZATION WITH CORONARY ANGIOGRAM N/A 03/18/2012   Procedure: LEFT HEART CATHETERIZATION WITH CORONARY ANGIOGRAM;  Surgeon: JMinus Breeding MD;  [facility];  Service: Cardiovascular;  Laterality: N/A;  . TONSILLECTOMY   age 84    Family History  Problem Relation Age of Onset  . Cancer Sister        unknown  . Hypertension Mother   . Stroke Mother   . Arthritis Father   . Heart disease Father   . Diabetes Son      Social History   Socioeconomic History  . Marital status: Married    Spouse name: Not on file  . Number of children: Not on file  . Years of education: Not on file  . Highest education level: Not on file  Occupational History  . Not on file  Social Needs  . Financial resource strain: Not on file  . Food insecurity:    Worry: Not on file    Inability: Not on file  . Transportation needs:    Medical: Not on file    Non-medical: Not on file  Tobacco Use  . Smoking status: Former Smoker    Packs/day: 1.00    Years: 10.00    Pack years: 10.00    Types: Cigarettes    Last attempt to quit: 08/27/1977    Years since quitting: 40.4  . Smokeless tobacco: Never Used  Substance and Sexual Activity  . Alcohol use: Yes    Comment: rare  . Drug use: No  . Sexual activity: Yes    Birth control/protection: Post-menopausal  Lifestyle  . Physical activity:    Days per week: Not on file    Minutes per session: Not on file  . Stress: Not on file    Relationships  . Social connections:    Talks on phone: Not on file  Gets together: Not on file    Attends religious service: Not on file    Active member of club or organization: Not on file    Attends meetings of clubs or organizations: Not on file    Relationship status: Not on file  . Intimate partner violence:    Fear of current or ex partner: Not on file    Emotionally abused: Not on file    Physically abused: Not on file    Forced sexual activity: Not on file  Other Topics Concern  . Not on file  Social History Narrative   ** Merged History Encounter **         BP 160/90, P - 79, R - 16  Physical Exam:  Well appearing elderly woman, NAD HEENT: Unremarkable Neck:  6 cm JVD, no thyromegally Lymphatics:  No adenopathy Back:  No CVA tenderness Lungs:  Clear with no wheezes HEART:  Regular rate rhythm, no murmurs, no rubs, no clicks Abd:  soft, positive bowel sounds, no organomegally, no rebound, no guarding Ext:  2 plus pulses, no edema, no cyanosis, no clubbing Skin:  No rashes no nodules Neuro:  CN II through XII intact, motor grossly intact  EKG - NSR   Assess/Plan: 1. PAF - she has reverted back to NSR. She feels well. I encouraged her to start her anti-coagulation back. She will try xarelto. She will continue the low dose of her beta blocker. 2. HTN -her blood pressure is not well controlled. I would ask her to start the beta blocker.  3. Chest pain - this has been quiet.  4. Leg pain - not clearly due to peripheral vascular disease but she is at risk. She will undergo ABI's.   [name].D

## 2018-01-19 NOTE — Patient Instructions (Addendum)
Medication Instructions:  Your physician has recommended you make the following change in your medication:  1.  Start taking Xarelto 20 mg.  Take one tablet by mouth daily with supper. 2.  Start taking your Toprol XL 25 mg - take 1/2 tablet by mouth daily.  May increase to 1/2 tablet by mouth twice a day if needed.  Let me know if you start taking this medication twice a day everyday.  Labwork: None ordered.  Testing/Procedures: Your physician has requested that you have an ankle brachial index (ABI). During this test an ultrasound and blood pressure cuff are used to evaluate the arteries that supply the arms and legs with blood. Allow thirty minutes for this exam. There are no restrictions or special instructions.  Please schedule for ABI's at [facility] office.  Follow-Up: Your physician wants you to follow-up in: 6 months with Dr. Lovena Le.   You will receive a reminder letter in the mail two months in advance. If you don't receive a letter, please call our office to schedule the follow-up appointment.  Any Other Special Instructions Will Be Listed Below (If Applicable).  If you need a refill on your cardiac medications before your next appointment, please call your pharmacy.   Rivaroxaban oral tablets What is this medicine? RIVAROXABAN (ri va ROX a ban) is an anticoagulant (blood thinner). It is used to treat blood clots in the lungs or in the veins. It is also used after knee or hip surgeries to prevent blood clots. It is also used to lower the chance of stroke in people with a medical condition called atrial fibrillation. This medicine may be used for other purposes; ask your health care provider or pharmacist if you have questions. COMMON BRAND NAME(S): Xarelto, Xarelto Starter Pack What should I tell my health care provider before I take this medicine? They need to know if you have any of these conditions: -bleeding disorders -bleeding in the brain -blood in your stools (black or  tarry stools) or if you have blood in your vomit -history of stomach bleeding -kidney disease -liver disease -low blood counts, like low white cell, platelet, or red cell counts -recent or planned spinal or epidural procedure -take medicines that treat or prevent blood clots -an unusual or allergic reaction to rivaroxaban, other medicines, foods, dyes, or preservatives -pregnant or trying to get pregnant -breast-feeding How should I use this medicine? Take this medicine by mouth with a glass of water. Follow the directions on the prescription label. Take your medicine at regular intervals. Do not take it more often than directed. Do not stop taking except on your doctor's advice. Stopping this medicine may increase your risk of a blood clot. Be sure to refill your prescription before you run out of medicine. If you are taking this medicine after hip or knee replacement surgery, take it with or without food. If you are taking this medicine for atrial fibrillation, take it with your evening meal. If you are taking this medicine to treat blood clots, take it with food at the same time each day. If you are unable to swallow your tablet, you may crush the tablet and mix it in applesauce. Then, immediately eat the applesauce. You should eat more food right after you eat the applesauce containing the crushed tablet. Talk to your pediatrician regarding the use of this medicine in children. Special care may be needed. Overdosage: If you think you have taken too much of this medicine contact a poison control center or emergency  room at once. NOTE: This medicine is only for you. Do not share this medicine with others. What if I miss a dose? If you take your medicine once a day and miss a dose, take the missed dose as soon as you remember. If you take your medicine twice a day and miss a dose, take the missed dose immediately. In this instance, 2 tablets may be taken at the same time. The next day you should  take 1 tablet twice a day as directed. What may interact with this medicine? Do not take this medicine with any of the following medications: -defibrotide This medicine may also interact with the following medications: -aspirin and aspirin-like medicines -certain antibiotics like erythromycin, azithromycin, and clarithromycin -certain medicines for fungal infections like ketoconazole and itraconazole -certain medicines for irregular heart beat like amiodarone, quinidine, dronedarone -certain medicines for seizures like carbamazepine, phenytoin -certain medicines that treat or prevent blood clots like warfarin, enoxaparin, and dalteparin -conivaptan -diltiazem -felodipine -indinavir -lopinavir; ritonavir -NSAIDS, medicines for pain and inflammation, like ibuprofen or naproxen -ranolazine -rifampin -ritonavir -SNRIs, medicines for depression, like desvenlafaxine, duloxetine, levomilnacipran, venlafaxine -SSRIs, medicines for depression, like citalopram, escitalopram, fluoxetine, fluvoxamine, paroxetine, sertraline -St. John's wort -verapamil This list may not describe all possible interactions. Give your health care provider a list of all the medicines, herbs, non-prescription drugs, or dietary supplements you use. Also tell them if you smoke, drink alcohol, or use illegal drugs. Some items may interact with your medicine. What should I watch for while using this medicine? Visit your doctor or health care professional for regular checks on your progress. Notify your doctor or health care professional and seek emergency treatment if you develop breathing problems; changes in vision; chest pain; severe, sudden headache; pain, swelling, warmth in the leg; trouble speaking; sudden numbness or weakness of the face, arm or leg. These can be signs that your condition has gotten worse. If you are going to have surgery or other procedure, tell your doctor that you are taking this medicine. What  side effects may I notice from receiving this medicine? Side effects that you should report to your doctor or health care professional as soon as possible: -allergic reactions like skin rash, itching or hives, swelling of the face, lips, or tongue -back pain -redness, blistering, peeling or loosening of the skin, including inside the mouth -signs and symptoms of bleeding such as bloody or black, tarry stools; red or dark-brown urine; spitting up blood or brown material that looks like coffee grounds; red spots on the skin; unusual bruising or bleeding from the eye, gums, or nose Side effects that usually do not require medical attention (report to your doctor or health care professional if they continue or are bothersome): -dizziness -muscle pain This list may not describe all possible side effects. Call your doctor for medical advice about side effects. You may report side effects to FDA at 1-800-FDA-1088. Where should I keep my medicine? Keep out of the reach of children. Store at room temperature between 15 and 30 degrees C (59 and 86 degrees F). Throw away any unused medicine after the expiration date. NOTE: This sheet is a summary. It may not cover all possible information. If you have questions about this medicine, talk to your doctor, pharmacist, or health care provider.  2018 Elsevier/Gold Standard (2016-04-15 16:29:33)

## 2018-01-21 ENCOUNTER — Other Ambulatory Visit: Payer: Self-pay | Admitting: Internal Medicine

## 2018-01-21 DIAGNOSIS — M79605 Pain in left leg: Principal | ICD-10-CM

## 2018-01-21 DIAGNOSIS — M79604 Pain in right leg: Secondary | ICD-10-CM

## 2018-01-21 DIAGNOSIS — I739 Peripheral vascular disease, unspecified: Secondary | ICD-10-CM

## 2018-01-27 ENCOUNTER — Ambulatory Visit (HOSPITAL_COMMUNITY)
Admission: RE | Admit: 2018-01-27 | Discharge: 2018-01-27 | Disposition: A | Discharge status: Home / Self Care | Payer: Medicare Other | Source: Ambulatory Visit | Attending: Cardiology | Admitting: Cardiology

## 2018-01-27 DIAGNOSIS — M79604 Pain in right leg: Secondary | ICD-10-CM | POA: Insufficient documentation

## 2018-01-27 DIAGNOSIS — M79605 Pain in left leg: Secondary | ICD-10-CM | POA: Diagnosis not present

## 2018-01-27 DIAGNOSIS — I739 Peripheral vascular disease, unspecified: Secondary | ICD-10-CM | POA: Diagnosis not present

## 2018-01-31 NOTE — Progress Notes (Signed)
Subjective:    Patient ID: [name], female    DOB: 1929-05-08, 84 y.o.   MRN: [id number]  HPI The patient is here for an acute visit.  Since she was here last she had palpitations last month and went to the ED and was diagnosed with new onset Afib. She was placed on eliquis.  She had headaches and dizziness.  She stopped the Eliquis and saw Dr. Lovena Le.  He put her on Xarelto.  Since starting that medication she has had multiple side effects that she feels is related to the medication.  She states epigastric pain, diarrhea, headaches, dizziness, increased joint pain and weakness in her legs.  She also states decreased appetite, fatigue, chills, hoarseness, mild shortness of breath.  She was advised that she should not stop the medication abruptly because it could cause a stroke.  She was unsure what she should do.  She has not had any chest pain, palpitations or leg swelling.  She denies any blood in stool.  She denies any other changes in medications or anything else since starting the Xarelto that could explain her symptoms.    Medications and allergies reviewed with patient and updated if appropriate.  Patient Active Problem List   Diagnosis Date Noted  . Leg cramps 07/05/2017  . Constipation 01/25/2017  . Closed fracture of proximal end of right humerus 07/09/2016  . Cough 06/17/2016  . AP (abdominal pain) 03/10/2016  . Hearing loss 01/03/2016  . Allergic rhinitis 01/03/2016  . Minimal Coronary Plaque by Cardiac Cath in 2013 09/11/2015  . Palpitations 09/06/2015  . Frequent loose stools 08/28/2015  . Hyponatremia   . Acute mesenteric ischemia ([place])   . Hypothyroidism 07/11/2012  . Syncope 03/18/2012  . Abnormal LFTs   . Hypercholesteremia   . Hypertension   . Diabetes mellitus type 1 ([place])   . Osteoarthritis   . Thyroid nodule   . Osteoporosis, post-menopausal   . Primary cancer of upper outer quadrant of left female breast ([place]) 08/21/2011    Current  Outpatient Medications on File Prior to Visit  Medication Sig Dispense Refill  . ALPRAZolam (XANAX) 0.5 MG tablet TAKE 1/2 TABLET TWICE DAILY AS NEEDED FOR ANXIETY OR SLEEP 30 tablet 2  . aspirin EC 81 MG tablet Take 81 mg by mouth daily.    . B Complex-C (SUPER B COMPLEX PO) Take 1 tablet by mouth daily.    . BD VEO INSULIN SYRINGE U/F 31G X 15/64" 0.3 ML MISC USE WITH INJECTIONS 100 each 0  . carboxymethylcellulose (REFRESH PLUS) 0.5 % SOLN Place 1 drop into both eyes 3 (three) times daily as needed. Non-preservative    . cholecalciferol (VITAMIN D) 1000 UNITS tablet Take 5,000 Units by mouth daily.     . fish oil-omega-3 fatty acids 1000 MG capsule Take 2 g by mouth daily.    . hydrochlorothiazide (MICROZIDE) 12.5 MG capsule Take 12.5 mg by mouth every other day.     . Insulin Glargine (LANTUS SOLOSTAR) 100 UNIT/ML Solostar Pen Inject 14 Units into the skin daily at 10 pm.     . insulin lispro (HUMALOG KWIKPEN) 100 UNIT/ML KiwkPen Inject into the skin. Sliding scale before meal    . losartan (COZAAR) 100 MG tablet Take 1 tablet (100 mg total) by mouth daily. 90 tablet 3  . metoprolol succinate (TOPROL XL) 25 MG 24 hr tablet Take 0.5 tablets (12.5 mg total) by mouth daily. 45 tablet 3  . Multiple Vitamin (MULTIVITAMIN) tablet  Take 1 tablet by mouth daily. For breast and bone health    . ranitidine (ZANTAC) 150 MG tablet Take 1 tablet (150 mg total) by mouth at bedtime. 90 tablet 1  . rivaroxaban (XARELTO) 20 MG TABS tablet Take 1 tablet (20 mg total) by mouth daily with supper. 30 tablet 11  . simvastatin (ZOCOR) 20 MG tablet Take 20 mg by mouth at bedtime.     [name] SYNTHROID 100 MCG tablet Take 1 tablet (100 mcg total) by mouth daily before breakfast.     No current facility-administered medications on file prior to visit.     Past Medical History:  Diagnosis Date  . Arthritis    fingers  . Cancer of upper-outer quadrant of female breast ([place]) 08/21/2011   ER +  PR +  Her 2 -  Ki67 9%    0.9 cm invasive lobular  Carcinoma ,s/p central lumpectomy with sentinel node biopsy,  ER/PR positive s/p re-excion on 09/16/11 with final pathology showing atypical hyperplasia   . CAP (community acquired pneumonia) 12/06/2016  . Diabetes mellitus    type wears insulin pump  . Diabetes mellitus type 1 (HCC)    type I- wears insulin pump  . GERD (gastroesophageal reflux disease)   . Hypercholesteremia   . Hypertension   . Hyponatremia   . Hypothyroid   . Hypothyroidism   . Osteoarthritis   . Osteoporosis, post-menopausal    DEXA 12/02/11: -3.5 (with lomax gyn), declines bisphos due to bone pain  . PAF (paroxysmal atrial fibrillation) ([place])    One episode in 2008, spontaneously converted in the ED, no further treatment  . Spondylosis    thoracic spine  . Thyroid nodule dx 07/2011   [place] q 51moto follow calcified L thyroid nodule (12/08/11 [place]    Past Surgical History:  Procedure Laterality Date  . ABDOMINAL HYSTERECTOMY    . ABDOMINAL HYSTERECTOMY  yrs ago  . APPENDECTOMY    . APPENDECTOMY  age 84 . BLADDER REPAIR  5 yrs ago  . BREAST LUMPECTOMY    . BREAST SURGERY     biopsy  . BREAST SURGERY   yrs ago   br bx  . BREAST SURGERY  01/ 23/2013   left central lumpectomy and snbx, re-excision lumpectomy  . CARDIOVASCULAR STRESS TEST  03/17/2012  . LEFT HEART CATHETERIZATION WITH CORONARY ANGIOGRAM N/A 03/18/2012   Procedure: LEFT HEART CATHETERIZATION WITH CORONARY ANGIOGRAM;  Surgeon: JMinus Breeding MD;  [facility];  Service: Cardiovascular;  Laterality: N/A;  . TONSILLECTOMY   age 89   Social History   Socioeconomic History  . Marital status: Married    Spouse name: Not on file  . Number of children: Not on file  . Years of education: Not on file  . Highest education level: Not on file  Occupational History  . Not on file  Social Needs  . Financial resource strain: Not on file  . Food insecurity:    Worry: Not on file    Inability: Not on file  .  Transportation needs:    Medical: Not on file    Non-medical: Not on file  Tobacco Use  . Smoking status: Former Smoker    Packs/day: 1.00    Years: 10.00    Pack years: 10.00    Types: Cigarettes    Last attempt to quit: 08/27/1977    Years since quitting: 40.4  . Smokeless tobacco: Never Used  Substance and Sexual Activity  .  Alcohol use: Yes    Comment: rare  . Drug use: No  . Sexual activity: Yes    Birth control/protection: Post-menopausal  Lifestyle  . Physical activity:    Days per week: Not on file    Minutes per session: Not on file  . Stress: Not on file  Relationships  . Social connections:    Talks on phone: Not on file    Gets together: Not on file    Attends religious service: Not on file    Active member of club or organization: Not on file    Attends meetings of clubs or organizations: Not on file    Relationship status: Not on file  Other Topics Concern  . Not on file  Social History Narrative   ** Merged History Encounter **        Family History  Problem Relation Age of Onset  . Cancer Sister        unknown  . Hypertension Mother   . Stroke Mother   . Arthritis Father   . Heart disease Father   . Diabetes Son     Review of Systems  Constitutional: Positive for appetite change (decreased), chills and fatigue. Negative for fever.  HENT: Positive for voice change.   Respiratory: Positive for shortness of breath (little). Negative for cough and wheezing.   Cardiovascular: Negative for chest pain, palpitations and leg swelling.  Gastrointestinal: Positive for abdominal pain, diarrhea and nausea. Negative for blood in stool and vomiting.       GERD  Musculoskeletal: Positive for arthralgias and back pain.  Neurological: Positive for dizziness and headaches.       Objective:   Vitals:   02/01/18 1316  BP: 130/74  Pulse: 94  Resp: 16  Temp: (!) 97.5 F (36.4 C)  SpO2: 93%   BP Readings from Last 3 Encounters:  02/01/18 130/74    12/30/17 140/76  12/26/17 (!) 132/98   Wt Readings from Last 3 Encounters:  02/01/18 140 lb (63.5 kg)  12/30/17 137 lb (62.1 kg)  12/26/17 139 lb (63 kg)   Body mass index is 21.93 kg/m.   Physical Exam  Constitutional: She appears well-developed and well-nourished. No distress.  HENT:  Head: Normocephalic and atraumatic.  Neck: No tracheal deviation present. No thyromegaly present.  Cardiovascular: Normal rate and regular rhythm.  Murmur heard. Pulmonary/Chest: Effort normal. No respiratory distress. She has no wheezes. She has no rales.  Abdominal: Soft. She exhibits no distension and no mass. There is tenderness (Mild diffuse tenderness). There is no rebound and no guarding. No hernia.  Musculoskeletal: She exhibits no edema.  Lymphadenopathy:    She has no cervical adenopathy.  Skin: Skin is warm and dry. She is not diaphoretic.           Assessment & Plan:    See Problem List for Assessment and Plan of chronic medical problems.

## 2018-02-01 ENCOUNTER — Encounter: Payer: Self-pay | Admitting: Internal Medicine

## 2018-02-01 ENCOUNTER — Ambulatory Visit: Payer: Medicare Other | Admitting: Internal Medicine

## 2018-02-01 DIAGNOSIS — I480 Paroxysmal atrial fibrillation: Secondary | ICD-10-CM | POA: Insufficient documentation

## 2018-02-01 DIAGNOSIS — T50905A Adverse effect of unspecified drugs, medicaments and biological substances, initial encounter: Secondary | ICD-10-CM

## 2018-02-01 DIAGNOSIS — I4891 Unspecified atrial fibrillation: Secondary | ICD-10-CM | POA: Insufficient documentation

## 2018-02-01 MED ORDER — ALPRAZOLAM 0.5 MG PO TABS: ORAL_TABLET | ORAL | 2 refills | Status: DC

## 2018-02-01 NOTE — Assessment & Plan Note (Signed)
Currently in sinus rhythm We will follow-up with Dr. Lovena Le regarding possible side effects from Xarelto

## 2018-02-01 NOTE — Patient Instructions (Signed)
Call Dr Lovena Le and discuss the Xarelto and other options.

## 2018-02-01 NOTE — Assessment & Plan Note (Signed)
She is experiencing multiple side effects that she believes is secondary to Xarelto including epigastric pain, diarrhea, headaches, dizziness, increased joint pain, weakness, decreased appetite, fatigue, chills, hoarseness and mild shortness of breath Advised that she call Dr. Tanna Furry office and see what alternatives he recommends.  Encouraged her not to discontinue the medication without first discussing it with Dr. Tanna Furry office If symptoms do not completely resolve after stopping medication she will follow-up with me

## 2018-03-18 ENCOUNTER — Ambulatory Visit: Payer: Medicare Other | Admitting: Internal Medicine

## 2018-03-27 NOTE — Patient Instructions (Addendum)
  Test(s) ordered today. Your results will be released to MyChart (or called to you) after review, usually within 72hours after test completion. If any changes need to be made, you will be notified at that same time.  Medications reviewed and updated.  No changes recommended at this time.    Please followup in 6 months   

## 2018-03-27 NOTE — Progress Notes (Signed)
Subjective:    Patient ID: [name], female    DOB: 26-Feb-1929, 84 y.o.   MRN: [id number]  HPI The patient is here for follow up.  Chronic constipation with diarrhea:  She has been taking metamucil daily and her stools are formed.  She has mild cramping prior to having a bowel movement, but they are mild and go away quick.  Overall she feels her bowels are well controlled.  Afib:  New onset 12/2017.  She stopped taking the xarelto because it was causing GI side effects.  She denies any palpitations, chest pain or shortness of breath.  She is taking a baby aspirin daily.  She has follow-up with Dr. Lovena Le in December and would like to wait to talk to him about the medication at that time.  She is not interested in trying any other anticoagulant.  Hypertension: She is taking her medication daily. She is compliant with a low sodium diet.  She denies chest pain, palpitations, edema, shortness of breath and regular headaches. She is exercising regularly-goes to the gym 3 times a week.     Hyperlipidemia: She is taking her medication daily. She is compliant with a low fat/cholesterol diet. She is exercising regularly.    Diabetes: She follows with endocrine.  She is taking her medication daily as prescribed. She is compliant with a diabetic diet. She is exercising regularly.  She checks her feet daily and denies foot lesions.   Hypothyroidism:  She follows with endo.  She is taking her medication daily.  She denies any recent changes in energy or weight that are unexplained.   She sleeps with her mouth wide open and she has a very dry mouth.  Her husband told her she did not snore.  She has mucus accumulate in her throat over night.  She does not feel rested in the morning.  She sleeps 9- 9 1/2 hours of sleep.  She wakes up suddenly at times and feels like she has lost her breath.  She typically does not feel tired during the day.  She thinks part of the reason she sleeps with her mouth open  is nasal congestion, deviated septum.  She does use nasal spray.  She did try the strips on her nose, but they tore her skin.  Medications and allergies reviewed with patient and updated if appropriate.  Patient Active Problem List   Diagnosis Date Noted  . Medication side effects, initial encounter 02/01/2018  . Atrial fibrillation ([place]) 02/01/2018  . Leg cramps 07/05/2017  . Constipation 01/25/2017  . Closed fracture of proximal end of right humerus 07/09/2016  . Cough 06/17/2016  . AP (abdominal pain) 03/10/2016  . Hearing loss 01/03/2016  . Allergic rhinitis 01/03/2016  . Minimal Coronary Plaque by Cardiac Cath in 2013 09/11/2015  . Palpitations 09/06/2015  . Frequent loose stools 08/28/2015  . Hyponatremia   . Acute mesenteric ischemia ([place])   . Hypothyroidism 07/11/2012  . Syncope 03/18/2012  . Abnormal LFTs   . Hypercholesteremia   . Hypertension   . Diabetes mellitus type 1 ([place])   . Osteoarthritis   . Thyroid nodule   . Osteoporosis, post-menopausal   . Primary cancer of upper outer quadrant of left female breast ([place]) 08/21/2011    Current Outpatient Medications on File Prior to Visit  Medication Sig Dispense Refill  . ALPRAZolam (XANAX) 0.5 MG tablet TAKE 1/2 TABLET TWICE DAILY AS NEEDED FOR ANXIETY OR SLEEP 30 tablet 2  . aspirin EC  81 MG tablet Take 81 mg by mouth daily.    . B Complex-C (SUPER B COMPLEX PO) Take 1 tablet by mouth daily.    . BD VEO INSULIN SYRINGE U/F 31G X 15/64" 0.3 ML MISC USE WITH INJECTIONS 100 each 0  . carboxymethylcellulose (REFRESH PLUS) 0.5 % SOLN Place 1 drop into both eyes 3 (three) times daily as needed. Non-preservative    . cholecalciferol (VITAMIN D) 1000 UNITS tablet Take 5,000 Units by mouth daily.     . fish oil-omega-3 fatty acids 1000 MG capsule Take 2 g by mouth daily.    . hydrochlorothiazide (MICROZIDE) 12.5 MG capsule Take 12.5 mg by mouth every other day.     . Insulin Glargine (LANTUS SOLOSTAR) 100 UNIT/ML Solostar  Pen Inject 14 Units into the skin daily at 10 pm.     . insulin lispro (HUMALOG KWIKPEN) 100 UNIT/ML KiwkPen Inject into the skin. Sliding scale before meal    . losartan (COZAAR) 100 MG tablet Take 1 tablet (100 mg total) by mouth daily. 90 tablet 3  . metoprolol succinate (TOPROL XL) 25 MG 24 hr tablet Take 0.5 tablets (12.5 mg total) by mouth daily. 45 tablet 3  . Multiple Vitamin (MULTIVITAMIN) tablet Take 1 tablet by mouth daily. For breast and bone health    . ranitidine (ZANTAC) 150 MG tablet Take 1 tablet (150 mg total) by mouth at bedtime. 90 tablet 1  . simvastatin (ZOCOR) 20 MG tablet Take 20 mg by mouth at bedtime.     [name] SYNTHROID 100 MCG tablet Take 1 tablet (100 mcg total) by mouth daily before breakfast.     No current facility-administered medications on file prior to visit.     Past Medical History:  Diagnosis Date  . Arthritis    fingers  . Cancer of upper-outer quadrant of female breast ([place]) 08/21/2011   ER +  PR +  Her 2 -  Ki67 9%   0.9 cm invasive lobular  Carcinoma ,s/p central lumpectomy with sentinel node biopsy,  ER/PR positive s/p re-excion on 09/16/11 with final pathology showing atypical hyperplasia   . CAP (community acquired pneumonia) 12/06/2016  . Diabetes mellitus    type wears insulin pump  . Diabetes mellitus type 1 (HCC)    type I- wears insulin pump  . GERD (gastroesophageal reflux disease)   . Hypercholesteremia   . Hypertension   . Hyponatremia   . Hypothyroid   . Hypothyroidism   . Osteoarthritis   . Osteoporosis, post-menopausal    DEXA 12/02/11: -3.5 (with lomax gyn), declines bisphos due to bone pain  . PAF (paroxysmal atrial fibrillation) ([place])    One episode in 2008, spontaneously converted in the ED, no further treatment  . Spondylosis    thoracic spine  . Thyroid nodule dx 07/2011   [place] q 62moto follow calcified L thyroid nodule (12/08/11 [place]    Past Surgical History:  Procedure Laterality Date  . ABDOMINAL HYSTERECTOMY    .  ABDOMINAL HYSTERECTOMY  yrs ago  . APPENDECTOMY    . APPENDECTOMY  age 84 . BLADDER REPAIR  5 yrs ago  . BREAST LUMPECTOMY    . BREAST SURGERY     biopsy  . BREAST SURGERY   yrs ago   br bx  . BREAST SURGERY  01/ 23/2013   left central lumpectomy and snbx, re-excision lumpectomy  . CARDIOVASCULAR STRESS TEST  03/17/2012  . LEFT HEART CATHETERIZATION WITH CORONARY ANGIOGRAM N/A 03/18/2012  Procedure: LEFT HEART CATHETERIZATION WITH CORONARY ANGIOGRAM;  Surgeon: Minus Breeding, MD;  Location: [facility] CATH LAB;  Service: Cardiovascular;  Laterality: N/A;  . TONSILLECTOMY   age 18    Social History   Socioeconomic History  . Marital status: Married    Spouse name: Not on file  . Number of children: Not on file  . Years of education: Not on file  . Highest education level: Not on file  Occupational History  . Not on file  Social Needs  . Financial resource strain: Not on file  . Food insecurity:    Worry: Not on file    Inability: Not on file  . Transportation needs:    Medical: Not on file    Non-medical: Not on file  Tobacco Use  . Smoking status: Former Smoker    Packs/day: 1.00    Years: 10.00    Pack years: 10.00    Types: Cigarettes    Last attempt to quit: 08/27/1977    Years since quitting: 40.6  . Smokeless tobacco: Never Used  Substance and Sexual Activity  . Alcohol use: Yes    Comment: rare  . Drug use: No  . Sexual activity: Yes    Birth control/protection: Post-menopausal  Lifestyle  . Physical activity:    Days per week: Not on file    Minutes per session: Not on file  . Stress: Not on file  Relationships  . Social connections:    Talks on phone: Not on file    Gets together: Not on file    Attends religious service: Not on file    Active member of club or organization: Not on file    Attends meetings of clubs or organizations: Not on file    Relationship status: Not on file  Other Topics Concern  . Not on file  Social History Narrative   ** Merged  History Encounter **        Family History  Problem Relation Age of Onset  . Cancer Sister        unknown  . Hypertension Mother   . Stroke Mother   . Arthritis Father   . Heart disease Father   . Diabetes Son     Review of Systems  Constitutional: Negative for chills and fever.  Respiratory: Positive for cough. Negative for shortness of breath and wheezing.   Cardiovascular: Negative for chest pain, palpitations and leg swelling.  Musculoskeletal: Positive for back pain and myalgias (inner thighs at night).  Neurological: Negative for dizziness, light-headedness and headaches.       Objective:   Vitals:   03/28/18 1450  BP: (!) 152/82  Pulse: 72  Resp: 16  Temp: 98.4 F (36.9 C)  SpO2: 95%   BP Readings from Last 3 Encounters:  03/28/18 (!) 152/82  02/01/18 130/74  12/30/17 140/76   Wt Readings from Last 3 Encounters:  03/28/18 134 lb (60.8 kg)  02/01/18 140 lb (63.5 kg)  12/30/17 137 lb (62.1 kg)   Body mass index is 20.99 kg/m.   Physical Exam    Constitutional: Appears well-developed and well-nourished. No distress.  HENT:  Head: Normocephalic and atraumatic.  Neck: Neck supple. No tracheal deviation present. No thyromegaly present.  No cervical lymphadenopathy Cardiovascular: Normal rate, regular rhythm and normal heart sounds.   1/6 systolic murmur heard. No carotid bruit .  No edema Pulmonary/Chest: Effort normal and breath sounds normal. No respiratory distress. No has no wheezes. No rales. Abdomen: Soft, nontender, nondistended  Skin: Skin is warm and dry. Not diaphoretic.  Psychiatric: Normal mood and affect. Behavior is normal.      Assessment & Plan:    See Problem List for Assessment and Plan of chronic medical problems.

## 2018-03-28 ENCOUNTER — Encounter: Payer: Self-pay | Admitting: Internal Medicine

## 2018-03-28 ENCOUNTER — Ambulatory Visit: Payer: Medicare Other | Admitting: Internal Medicine

## 2018-03-28 ENCOUNTER — Other Ambulatory Visit (INDEPENDENT_AMBULATORY_CARE_PROVIDER_SITE_OTHER): Payer: Medicare Other

## 2018-03-28 VITALS — BP 152/82 | HR 72 | Temp 98.4°F | Resp 16 | Ht 67.0 in | Wt 134.0 lb

## 2018-03-28 DIAGNOSIS — E039 Hypothyroidism, unspecified: Secondary | ICD-10-CM | POA: Diagnosis not present

## 2018-03-28 DIAGNOSIS — E1043 Type 1 diabetes mellitus with diabetic autonomic (poly)neuropathy: Secondary | ICD-10-CM | POA: Diagnosis not present

## 2018-03-28 DIAGNOSIS — E7800 Pure hypercholesterolemia, unspecified: Secondary | ICD-10-CM

## 2018-03-28 DIAGNOSIS — I10 Essential (primary) hypertension: Secondary | ICD-10-CM

## 2018-03-28 DIAGNOSIS — I480 Paroxysmal atrial fibrillation: Secondary | ICD-10-CM | POA: Diagnosis not present

## 2018-03-28 DIAGNOSIS — K5900 Constipation, unspecified: Secondary | ICD-10-CM

## 2018-03-28 LAB — COMPREHENSIVE METABOLIC PANEL
ALBUMIN: 4.3 g/dL (ref 3.5–5.2)
ALK PHOS: 74 U/L (ref 39–117)
ALT: 14 U/L (ref 0–35)
AST: 23 U/L (ref 0–37)
BUN: 16 mg/dL (ref 6–23)
CO2: 27 mEq/L (ref 19–32)
Calcium: 10.1 mg/dL (ref 8.4–10.5)
Chloride: 98 mEq/L (ref 96–112)
Creatinine, Ser: 0.8 mg/dL (ref 0.40–1.20)
GFR: 72.15 mL/min (ref >60.00)
Glucose, Bld: 63 mg/dL — ABNORMAL LOW (ref 70–99)
POTASSIUM: 3.9 meq/L (ref 3.5–5.1)
Sodium: 131 mEq/L — ABNORMAL LOW (ref 135–145)
TOTAL PROTEIN: 7.6 g/dL (ref 6.0–8.3)
Total Bilirubin: 0.6 mg/dL (ref 0.2–1.2)

## 2018-03-28 LAB — HEMOGLOBIN A1C: HEMOGLOBIN A1C: 7.5 % — AB (ref 4.6–6.5)

## 2018-03-28 LAB — LIPID PANEL
CHOLESTEROL: 166 mg/dL (ref 0–200)
HDL: 47.5 mg/dL (ref >39.00)
LDL Cholesterol: 103 mg/dL — ABNORMAL HIGH (ref 0–99)
NonHDL: 118.85
Total CHOL/HDL Ratio: 4
Triglycerides: 80 mg/dL (ref 0.0–149.0)
VLDL: 16 mg/dL (ref 0.0–40.0)

## 2018-03-28 NOTE — Assessment & Plan Note (Signed)
Appears to be in sinus today Did not tolerate Eliquis or Xarelto She is taking aspirin 81 mg daily She does not want to consider any other anticoagulant She will discuss this further with Dr. Lovena Le when she sees him in December Denies palpitations, chest pain

## 2018-03-28 NOTE — Assessment & Plan Note (Signed)
Management per endocrine 

## 2018-03-28 NOTE — Assessment & Plan Note (Signed)
Blood pressure slightly elevated here today, but in the past has been well controlled No changes in medication at this time CMP

## 2018-03-28 NOTE — Assessment & Plan Note (Signed)
Check lipid panel  Continue daily statin Regular exercise and healthy diet encouraged  

## 2018-03-28 NOTE — Assessment & Plan Note (Signed)
Management per endocrine Sugars have always been very well controlled Continue regular exercise

## 2018-03-28 NOTE — Assessment & Plan Note (Signed)
Overall controlled Has constipation presenting as loose stools/diarrhea Continue Metamucil daily

## 2018-04-02 ENCOUNTER — Other Ambulatory Visit: Payer: Self-pay | Admitting: Internal Medicine

## 2018-04-12 ENCOUNTER — Telehealth: Payer: Self-pay | Admitting: Adult Health

## 2018-04-12 NOTE — Telephone Encounter (Signed)
Faxed medical records to [facility]: [employer], Release ID: [id number]

## 2018-05-25 ENCOUNTER — Other Ambulatory Visit: Payer: Self-pay | Admitting: Internal Medicine

## 2018-05-25 MED ORDER — ALPRAZOLAM 0.5 MG PO TABS: ORAL_TABLET | ORAL | 2 refills | Status: DC

## 2018-05-25 NOTE — Telephone Encounter (Signed)
Copied from [place] [id number]. Topic: Quick Communication - Rx Refill/Question >> May 25, 2018 11:03 AM Oliver Pila B wrote: Medication: ALPRAZolam Duanne Moron) 0.5 MG tablet [[id number]]   Has the patient contacted their pharmacy? Yes.   (Agent: If no, request that the patient contact the pharmacy for the refill.) (Agent: If yes, when and what did the pharmacy advise?)  Preferred Pharmacy (with phone number or street name): [facility]: Please be advised that RX refills may take up to 3 business days. We ask that you follow-up with your pharmacy.

## 2018-05-25 NOTE — Telephone Encounter (Signed)
Requested medication (s) are due for refill today: yes  Requested medication (s) are on the active medication list: yes    Last refill: 02/01/18  Future visit scheduled yes  Notes to clinic:  Requested Prescriptions  Pending Prescriptions Disp Refills   ALPRAZolam (XANAX) 0.5 MG tablet 30 tablet 2    Sig: TAKE 1/2 TABLET TWICE DAILY AS NEEDED FOR ANXIETY OR SLEEP     Not Delegated - Psychiatry:  Anxiolytics/Hypnotics Failed - 05/25/2018 11:06 AM      Failed - This refill cannot be delegated      Failed - Urine Drug Screen completed in last 360 days.      Passed - Valid encounter within last 6 months    Recent Outpatient Visits          1 month ago Essential hypertension   [place], Claudina Lick, MD   3 months ago Medication side effects, initial encounter   [place], MD   8 months ago Osteoporosis, post-menopausal   [place], MD   10 months ago Hyponatremia   [place], MD   1 year ago Pneumonia due to infectious organism, unspecified laterality, unspecified part of lung   [place], MD      Future Appointments            In 1 month Evans Lance, MD [place]   In 4 months [place], Claudina Lick, MD [place], [place]

## 2018-05-25 NOTE — Telephone Encounter (Signed)
Check [place] registry last filled 04/24/2018.Marland KitchenJohny Foster

## 2018-05-26 NOTE — Telephone Encounter (Signed)
MD approved and sent electronically to [facility]  

## 2018-06-21 ENCOUNTER — Ambulatory Visit: Payer: Medicare Other

## 2018-07-11 ENCOUNTER — Encounter: Payer: Self-pay | Admitting: Internal Medicine

## 2018-07-11 NOTE — Progress Notes (Signed)
Subjective:    Patient ID: [name], female    DOB: [birth year], 84 y.o.   MRN: [id number]  HPI She is here for an acute visit for cold symptoms.  Her symptoms started 9 days ago.   She is experiencing nasal congestion, sore throat, sinus pain, PND, ear pain and cough.  She has had some chest tightness, wheeze, nausea, headaches and dizziness.  She denies fever, SOB and diarrhea.   She has taken Sucrets, otc cold medications, vicks, mucinex  Medications and allergies reviewed with patient and updated if appropriate.  Patient Active Problem List   Diagnosis Date Noted  . Medication side effects, initial encounter 02/01/2018  . Atrial fibrillation ([place]) 02/01/2018  . Leg cramps 07/05/2017  . Constipation 01/25/2017  . Closed fracture of proximal end of right humerus 07/09/2016  . Cough 06/17/2016  . AP (abdominal pain) 03/10/2016  . Hearing loss 01/03/2016  . Allergic rhinitis 01/03/2016  . Minimal Coronary Plaque by Cardiac Cath in 2013 09/11/2015  . Palpitations 09/06/2015  . Frequent loose stools 08/28/2015  . Hyponatremia   . Acute mesenteric ischemia ([place])   . Hypothyroidism 07/11/2012  . Syncope 03/18/2012  . Abnormal LFTs   . Hypercholesteremia   . Hypertension   . Diabetes mellitus type 1 ([place])   . Osteoarthritis   . Thyroid nodule   . Osteoporosis, post-menopausal   . Primary cancer of upper outer quadrant of left female breast ([place]) 08/21/2011    Current Outpatient Medications on File Prior to Visit  Medication Sig Dispense Refill  . ALPRAZolam (XANAX) 0.5 MG tablet TAKE 1/2 TABLET TWICE DAILY AS NEEDED FOR ANXIETY OR SLEEP 30 tablet 2  . aspirin EC 81 MG tablet Take 81 mg by mouth daily.    . B Complex-C (SUPER B COMPLEX PO) Take 1 tablet by mouth daily.    . BD VEO INSULIN SYRINGE U/F 31G X 15/64" 0.3 ML MISC USE WITH INJECTIONS 100 each 1  . carboxymethylcellulose (REFRESH PLUS) 0.5 % SOLN Place 1 drop into both eyes 3 (three) times daily as  needed. Non-preservative    . cholecalciferol (VITAMIN D) 1000 UNITS tablet Take 5,000 Units by mouth daily.     . fish oil-omega-3 fatty acids 1000 MG capsule Take 2 g by mouth daily.    . hydrochlorothiazide (MICROZIDE) 12.5 MG capsule Take 12.5 mg by mouth every other day.     . Insulin Glargine (LANTUS SOLOSTAR) 100 UNIT/ML Solostar Pen Inject 14 Units into the skin daily at 10 pm.     . insulin lispro (HUMALOG KWIKPEN) 100 UNIT/ML KiwkPen Inject into the skin. Sliding scale before meal    . losartan (COZAAR) 100 MG tablet Take 1 tablet (100 mg total) by mouth daily. 90 tablet 3  . metoprolol succinate (TOPROL XL) 25 MG 24 hr tablet Take 0.5 tablets (12.5 mg total) by mouth daily. 45 tablet 3  . Multiple Vitamin (MULTIVITAMIN) tablet Take 1 tablet by mouth daily. For breast and bone health    . ranitidine (ZANTAC) 150 MG tablet Take 1 tablet (150 mg total) by mouth at bedtime. 90 tablet 1  . simvastatin (ZOCOR) 20 MG tablet Take 20 mg by mouth at bedtime.     [name] SYNTHROID 100 MCG tablet Take 1 tablet (100 mcg total) by mouth daily before breakfast.     No current facility-administered medications on file prior to visit.     Past Medical History:  Diagnosis Date  . Arthritis  fingers  . Cancer of upper-outer quadrant of female breast ([place]) 08/21/2011   ER +  PR +  Her 2 -  Ki67 9%   0.9 cm invasive lobular  Carcinoma ,s/p central lumpectomy with sentinel node biopsy,  ER/PR positive s/p re-excion on 09/16/11 with final pathology showing atypical hyperplasia   . CAP (community acquired pneumonia) 12/06/2016  . Diabetes mellitus    type wears insulin pump  . Diabetes mellitus type 1 (HCC)    type I- wears insulin pump  . GERD (gastroesophageal reflux disease)   . Hypercholesteremia   . Hypertension   . Hyponatremia   . Hypothyroid   . Hypothyroidism   . Osteoarthritis   . Osteoporosis, post-menopausal    DEXA 12/02/11: -3.5 (with lomax gyn), declines bisphos due to bone pain  . PAF  (paroxysmal atrial fibrillation) ([place])    One episode in 2008, spontaneously converted in the ED, no further treatment  . Spondylosis    thoracic spine  . Thyroid nodule dx 07/2011   [place] q 62moto follow calcified L thyroid nodule (12/08/11 [place]    Past Surgical History:  Procedure Laterality Date  . ABDOMINAL HYSTERECTOMY    . ABDOMINAL HYSTERECTOMY  yrs ago  . APPENDECTOMY    . APPENDECTOMY  age 89 . BLADDER REPAIR  5 yrs ago  . BREAST LUMPECTOMY    . BREAST SURGERY     biopsy  . BREAST SURGERY   yrs ago   br bx  . BREAST SURGERY  01/ 23/2013   left central lumpectomy and snbx, re-excision lumpectomy  . CARDIOVASCULAR STRESS TEST  03/17/2012  . LEFT HEART CATHETERIZATION WITH CORONARY ANGIOGRAM N/A 03/18/2012   Procedure: LEFT HEART CATHETERIZATION WITH CORONARY ANGIOGRAM;  Surgeon: JMinus Breeding MD;  [facility];  Service: Cardiovascular;  Laterality: N/A;  . TONSILLECTOMY   age 84   Social History   Socioeconomic History  . Marital status: Married    Spouse name: Not on file  . Number of children: Not on file  . Years of education: Not on file  . Highest education level: Not on file  Occupational History  . Not on file  Social Needs  . Financial resource strain: Not on file  . Food insecurity:    Worry: Not on file    Inability: Not on file  . Transportation needs:    Medical: Not on file    Non-medical: Not on file  Tobacco Use  . Smoking status: Former Smoker    Packs/day: 1.00    Years: 10.00    Pack years: 10.00    Types: Cigarettes    Last attempt to quit: 08/27/1977    Years since quitting: 40.9  . Smokeless tobacco: Never Used  Substance and Sexual Activity  . Alcohol use: Yes    Comment: rare  . Drug use: No  . Sexual activity: Yes    Birth control/protection: Post-menopausal  Lifestyle  . Physical activity:    Days per week: Not on file    Minutes per session: Not on file  . Stress: Not on file  Relationships  . Social connections:     Talks on phone: Not on file    Gets together: Not on file    Attends religious service: Not on file    Active member of club or organization: Not on file    Attends meetings of clubs or organizations: Not on file    Relationship status: Not on file  Other Topics Concern  . Not on file  Social History Narrative   ** Merged History Encounter **        Family History  Problem Relation Age of Onset  . Cancer Sister        unknown  . Hypertension Mother   . Stroke Mother   . Arthritis Father   . Heart disease Father   . Diabetes Son     Review of Systems  Constitutional: Negative for fever.  HENT: Positive for congestion, ear pain, postnasal drip, sinus pressure and sore throat.   Respiratory: Positive for cough (occ productive), chest tightness and wheezing. Negative for shortness of breath.   Gastrointestinal: Positive for nausea. Negative for diarrhea.  Neurological: Positive for dizziness and headaches.       Objective:   Vitals:   07/12/18 1258  BP: (!) 144/76  Pulse: 80  Resp: 16  Temp: 98.6 F (37 C)  SpO2: 97%   Filed Weights   07/12/18 1258  Weight: 140 lb (63.5 kg)   Body mass index is 21.93 kg/m.  Wt Readings from Last 3 Encounters:  07/12/18 140 lb (63.5 kg)  03/28/18 134 lb (60.8 kg)  02/01/18 140 lb (63.5 kg)     Physical Exam GENERAL APPEARANCE: Appears stated age, well appearing, NAD EYES: conjunctiva clear, no icterus HEENT: bilateral tympanic membranes and ear canals normal, oropharynx with mild erythema, no thyromegaly, trachea midline, no cervical or supraclavicular lymphadenopathy LUNGS: Clear to auscultation without wheeze or crackles, unlabored breathing, good air entry bilaterally CARDIOVASCULAR: Normal S1,S2 without murmurs, no edema SKIN: warm, dry        Assessment & Plan:   See Problem List for Assessment and Plan of chronic medical problems.

## 2018-07-12 ENCOUNTER — Encounter: Payer: Self-pay | Admitting: Internal Medicine

## 2018-07-12 ENCOUNTER — Ambulatory Visit: Payer: Medicare Other | Admitting: Internal Medicine

## 2018-07-12 VITALS — BP 144/76 | HR 80 | Temp 98.6°F | Resp 16 | Ht 67.0 in | Wt 140.0 lb

## 2018-07-12 DIAGNOSIS — J069 Acute upper respiratory infection, unspecified: Secondary | ICD-10-CM | POA: Diagnosis not present

## 2018-07-12 MED ORDER — AZITHROMYCIN 250 MG PO TABS: ORAL_TABLET | ORAL | 0 refills | Status: DC

## 2018-07-12 NOTE — Assessment & Plan Note (Signed)
Concern for bacterial cause Start zpak otc cold medications Rest, fluid Call if no improvement

## 2018-07-12 NOTE — Patient Instructions (Signed)
° ° °  Take the antibiotic as prescribed - complete the entire course.   ° ° °Continue over the counter cold medication, advil and tylenol.  Increase your fluids and rest.  ° ° °Call if no improvement   ° °

## 2018-07-18 ENCOUNTER — Ambulatory Visit: Payer: Medicare Other | Admitting: Internal Medicine

## 2018-07-18 ENCOUNTER — Encounter: Payer: Self-pay | Admitting: Internal Medicine

## 2018-07-18 VITALS — BP 142/78 | HR 68 | Ht 67.0 in | Wt 141.0 lb

## 2018-07-18 DIAGNOSIS — M79605 Pain in left leg: Secondary | ICD-10-CM | POA: Diagnosis not present

## 2018-07-18 DIAGNOSIS — I10 Essential (primary) hypertension: Secondary | ICD-10-CM | POA: Diagnosis not present

## 2018-07-18 DIAGNOSIS — M79604 Pain in right leg: Secondary | ICD-10-CM | POA: Diagnosis not present

## 2018-07-18 DIAGNOSIS — I480 Paroxysmal atrial fibrillation: Secondary | ICD-10-CM | POA: Diagnosis not present

## 2018-07-18 MED ORDER — METOPROLOL SUCCINATE ER 25 MG PO TB24: 12.5000 mg | ORAL_TABLET | ORAL | 11 refills | Status: DC | PRN

## 2018-07-18 NOTE — Patient Instructions (Addendum)

## 2018-07-18 NOTE — Progress Notes (Signed)
HPI [name] returns today for followup of atrial fib. She is a pleasant 84 yo woman with non-cardiac chest pain, HTN and palpitations who was found to have PAF and placed on medical therapy with low dose beta blocker and she was first placed on Eliquis (HA) and then Xarelto (diarrhea) and has stopped both. She denies chest pain. Her leg pain is improved. She was noted to have normal non-invasive doppler studies of her legs except for a reduce great toe ABI bilaterally. She denies sob. No palpitations. Allergies  Allergen Reactions  . Eliquis [Apixaban] Other (See Comments)    Dizziness, severe headach  . Amlodipine     Dizzy, nausea  . Augmentin [Amoxicillin-Pot Clavulanate] Other (See Comments)    Gi upset only no rash or hives   . Cortisone Other (See Comments)    Makes blood sugars run high  . Keflex [Cephalexin] Hives and Nausea Only    Stomach upset  . Omeprazole Nausea Only    Dizziness and nausea  . Prednisone     Other reaction(s): Other Makes blood sugars run high  . Hydrocodone Rash    Rash to chest , side back  . Latex Rash    Powder in gloves causes rash; "not allergic to latex just the powder"     Current Outpatient Medications  Medication Sig Dispense Refill  . ALPRAZolam (XANAX) 0.5 MG tablet TAKE 1/2 TABLET TWICE DAILY AS NEEDED FOR ANXIETY OR SLEEP 30 tablet 2  . aspirin EC 81 MG tablet Take 81 mg by mouth daily.    . B Complex-C (SUPER B COMPLEX PO) Take 1 tablet by mouth daily.    . BD VEO INSULIN SYRINGE U/F 31G X 15/64" 0.3 ML MISC USE WITH INJECTIONS 100 each 1  . carboxymethylcellulose (REFRESH PLUS) 0.5 % SOLN Place 1 drop into both eyes 3 (three) times daily as needed. Non-preservative    . cholecalciferol (VITAMIN D) 1000 UNITS tablet Take 5,000 Units by mouth daily.     . fish oil-omega-3 fatty acids 1000 MG capsule Take 2 g by mouth daily.    . hydrochlorothiazide (MICROZIDE) 12.5 MG capsule Take 12.5 mg by mouth every other day.     .  Insulin Glargine (LANTUS SOLOSTAR) 100 UNIT/ML Solostar Pen Inject 14 Units into the skin daily at 10 pm.     . insulin lispro (HUMALOG KWIKPEN) 100 UNIT/ML KiwkPen Inject into the skin. Sliding scale before meal    . losartan (COZAAR) 100 MG tablet Take 1 tablet (100 mg total) by mouth daily. 90 tablet 3  . metoprolol succinate (TOPROL XL) 25 MG 24 hr tablet Take 0.5 tablets (12.5 mg total) by mouth as needed. AS NEEDED FOR PALPITATIONS 30 tablet 11  . Multiple Vitamin (MULTIVITAMIN) tablet Take 1 tablet by mouth daily. For breast and bone health    . ranitidine (ZANTAC) 150 MG tablet Take 1 tablet (150 mg total) by mouth at bedtime. 90 tablet 1  . simvastatin (ZOCOR) 20 MG tablet Take 20 mg by mouth at bedtime.     [name] SYNTHROID 100 MCG tablet Take 1 tablet (100 mcg total) by mouth daily before breakfast.     No current facility-administered medications for this visit.      Past Medical History:  Diagnosis Date  . Arthritis    fingers  . Cancer of upper-outer quadrant of female breast ([place]) 08/21/2011   ER +  PR +  Her 2 -  Ki67 9%  0.9 cm invasive lobular  Carcinoma ,s/p central lumpectomy with sentinel node biopsy,  ER/PR positive s/p re-excion on 09/16/11 with final pathology showing atypical hyperplasia   . CAP (community acquired pneumonia) 12/06/2016  . Diabetes mellitus    type wears insulin pump  . Diabetes mellitus type 1 (HCC)    type I- wears insulin pump  . GERD (gastroesophageal reflux disease)   . Hypercholesteremia   . Hypertension   . Hyponatremia   . Hypothyroid   . Hypothyroidism   . Osteoarthritis   . Osteoporosis, post-menopausal    DEXA 12/02/11: -3.5 (with lomax gyn), declines bisphos due to bone pain  . PAF (paroxysmal atrial fibrillation) ([place])    One episode in 2008, spontaneously converted in the ED, no further treatment  . Spondylosis    thoracic spine  . Thyroid nodule dx 07/2011   [place] q 28moto follow calcified L thyroid nodule (12/08/11 [place]    ROS:    All systems reviewed and negative except as noted in the HPI.   Past Surgical History:  Procedure Laterality Date  . ABDOMINAL HYSTERECTOMY    . ABDOMINAL HYSTERECTOMY  yrs ago  . APPENDECTOMY    . APPENDECTOMY  age 84 . BLADDER REPAIR  5 yrs ago  . BREAST LUMPECTOMY    . BREAST SURGERY     biopsy  . BREAST SURGERY   yrs ago   br bx  . BREAST SURGERY  01/ 23/2013   left central lumpectomy and snbx, re-excision lumpectomy  . CARDIOVASCULAR STRESS TEST  03/17/2012  . LEFT HEART CATHETERIZATION WITH CORONARY ANGIOGRAM N/A 03/18/2012   Procedure: LEFT HEART CATHETERIZATION WITH CORONARY ANGIOGRAM;  Surgeon: JMinus Breeding MD;  [facility];  Service: Cardiovascular;  Laterality: N/A;  . TONSILLECTOMY   age 84    Family History  Problem Relation Age of Onset  . Cancer Sister        unknown  . Hypertension Mother   . Stroke Mother   . Arthritis Father   . Heart disease Father   . Diabetes Son      Social History   Socioeconomic History  . Marital status: Married    Spouse name: Not on file  . Number of children: Not on file  . Years of education: Not on file  . Highest education level: Not on file  Occupational History  . Not on file  Social Needs  . Financial resource strain: Not on file  . Food insecurity:    Worry: Not on file    Inability: Not on file  . Transportation needs:    Medical: Not on file    Non-medical: Not on file  Tobacco Use  . Smoking status: Former Smoker    Packs/day: 1.00    Years: 10.00    Pack years: 10.00    Types: Cigarettes    Last attempt to quit: 08/27/1977    Years since quitting: 40.9  . Smokeless tobacco: Never Used  Substance and Sexual Activity  . Alcohol use: Yes    Comment: rare  . Drug use: No  . Sexual activity: Yes    Birth control/protection: Post-menopausal  Lifestyle  . Physical activity:    Days per week: Not on file    Minutes per session: Not on file  . Stress: Not on file  Relationships  .  Social connections:    Talks on phone: Not on file    Gets together: Not on file  Attends religious service: Not on file    Active member of club or organization: Not on file    Attends meetings of clubs or organizations: Not on file    Relationship status: Not on file  . Intimate partner violence:    Fear of current or ex partner: Not on file    Emotionally abused: Not on file    Physically abused: Not on file    Forced sexual activity: Not on file  Other Topics Concern  . Not on file  Social History Narrative   ** Merged History Encounter **         BP (!) 142/78   Pulse 68   Ht '5\' 7"'$  (1.702 m)   Wt 141 lb (64 kg)   BMI 22.08 kg/m   Physical Exam:  Well appearing NAD HEENT: Unremarkable Neck:  No JVD, no thyromegally Lymphatics:  No adenopathy Back:  No CVA tenderness Lungs:  Clear with no wheezes HEART:  Regular rate rhythm, no murmurs, no rubs, no clicks Abd:  soft, positive bowel sounds, no organomegally, no rebound, no guarding Ext:  2 plus pulses, no edema, no cyanosis, no clubbing Skin:  No rashes no nodules Neuro:  CN II through XII intact, motor grossly intact  ABI's - reviewed  Assess/Plan: 1. PAF - she remains without systemic anti-coagulation. I have offered her Coumadin but she is not interested. 2. HTN - her blood pressure is reasonably well controlled. We will follow. 3. Leg pain - no evidence of claudication. Her symptoms have improved in the interim. 4. Chest pain - her symptoms have improved. She is encouraged to maintain her physical activity.  Mikle Bosworth.D.

## 2018-07-25 ENCOUNTER — Other Ambulatory Visit: Payer: Self-pay | Admitting: Internal Medicine

## 2018-08-12 ENCOUNTER — Ambulatory Visit
Admission: RE | Admit: 2018-08-12 | Discharge: 2018-08-12 | Disposition: A | Discharge status: Home / Self Care | Payer: Medicare Other | Source: Ambulatory Visit | Attending: Adult Health | Admitting: Adult Health

## 2018-08-12 DIAGNOSIS — Z1231 Encounter for screening mammogram for malignant neoplasm of breast: Secondary | ICD-10-CM

## 2018-08-26 ENCOUNTER — Telehealth: Payer: Self-pay | Admitting: Hematology and Oncology

## 2018-08-26 NOTE — Telephone Encounter (Signed)
Called pt re appt being moved due to [facility] PAL - spoke with patient re appts.

## 2018-08-29 ENCOUNTER — Other Ambulatory Visit: Payer: Self-pay | Admitting: Internal Medicine

## 2018-08-29 NOTE — Telephone Encounter (Signed)
Last refill was 07/27/18 Last OV was 03/29/19 Next OV is 10/04/2018

## 2018-08-30 ENCOUNTER — Ambulatory Visit: Payer: Medicare Other | Admitting: Hematology and Oncology

## 2018-08-30 NOTE — Progress Notes (Signed)
Patient Care Team: Binnie Rail, MD as PCP - General (Internal Medicine) Rolm Bookbinder, MD (General Surgery) Lonna Duval, MD (Endocrinology) Marica Otter, OD (Optometry)  DIAGNOSIS:    ICD-10-CM   1. Primary cancer of upper outer quadrant of left female breast ([place]) C50.412     SUMMARY OF ONCOLOGIC HISTORY:   Primary cancer of upper outer quadrant of left female breast ([place])   09/02/2011 Surgery    Left lumpectomy: grade 1 lobular carcinoma measuring 0.9 cm with LCIS Medial and superior margins were focally positive, reexcision margins negative, 0/1 sentinel node, ER/PR positive HER-2 negative    09/25/2011 - 01/07/2012 Anti-estrogen oral therapy    Patient could not tolerate tamoxifen, also did not receive radiation at [facility] because of coronary artery disease and patient stopped after one radiation treatment     CHIEF COMPLIANT: Surveillance of left breast cancer  INTERVAL HISTORY: [name] is a 85 y.o. with above-mentioned history of left breast invasive lobular cancer treated with lumpectomy. I last saw her one year ago. Her most recent mammogram from 08/12/18 showed no evidence of malignancy bilaterally. She presents to the clinic alone today. She denies any pain or discomfort in her breasts but notes a few spots of discoloration around her chest for which she will see a dermatologist. She reviewed her medication list with me.   REVIEW OF SYSTEMS:   Constitutional: Denies fevers, chills or abnormal weight loss Eyes: Denies blurriness of vision Ears, nose, mouth, throat, and face: Denies mucositis or sore throat Respiratory: Denies cough, dyspnea or wheezes Cardiovascular: Denies palpitation, chest discomfort Gastrointestinal:  Denies nausea, heartburn or change in bowel habits Skin: Denies abnormal skin rashes (+) discolored spots on chest Lymphatics: Denies new lymphadenopathy or easy bruising Neurological: Denies numbness, tingling or  new weaknesses Behavioral/Psych: Mood is stable, no new changes  Extremities: No lower extremity edema Breast: denies any pain or lumps or nodules in either breasts All other systems were reviewed with the patient and are negative.  I have reviewed the past medical history, past surgical history, social history and family history with the patient and they are unchanged from previous note.  ALLERGIES:  is allergic to eliquis [apixaban]; amlodipine; augmentin [amoxicillin-pot clavulanate]; cortisone; keflex [cephalexin]; omeprazole; prednisone; hydrocodone; and latex.  MEDICATIONS:  Current Outpatient Medications  Medication Sig Dispense Refill  . ALPRAZolam (XANAX) 0.5 MG tablet TAKE 1/2 TABLET TWICE DAILY AS NEEDED FOR ANXIETY OR SLEEP 30 tablet 0  . aspirin EC 81 MG tablet Take 81 mg by mouth daily.    . B Complex-C (SUPER B COMPLEX PO) Take 1 tablet by mouth daily.    . BD VEO INSULIN SYRINGE U/F 31G X 15/64" 0.3 ML MISC USE WITH INJECTIONS 100 each 1  . carboxymethylcellulose (REFRESH PLUS) 0.5 % SOLN Place 1 drop into both eyes 3 (three) times daily as needed. Non-preservative    . cholecalciferol (VITAMIN D) 1000 UNITS tablet Take 5,000 Units by mouth daily.     . fish oil-omega-3 fatty acids 1000 MG capsule Take 2 g by mouth daily.    . hydrochlorothiazide (MICROZIDE) 12.5 MG capsule Take 12.5 mg by mouth every other day.     . Insulin Glargine (LANTUS SOLOSTAR) 100 UNIT/ML Solostar Pen Inject 14 Units into the skin daily at 10 pm.     . insulin lispro (HUMALOG KWIKPEN) 100 UNIT/ML KiwkPen Inject into the skin. Sliding scale before meal    . losartan (COZAAR) 100 MG tablet TAKE 1  TABLET BY MOUTH ONCE DAILY. 90 tablet 0  . metoprolol succinate (TOPROL XL) 25 MG 24 hr tablet Take 0.5 tablets (12.5 mg total) by mouth as needed. AS NEEDED FOR PALPITATIONS 30 tablet 11  . Multiple Vitamin (MULTIVITAMIN) tablet Take 1 tablet by mouth daily. For breast and bone health    . ranitidine  (ZANTAC) 150 MG tablet Take 1 tablet (150 mg total) by mouth at bedtime. 90 tablet 1  . simvastatin (ZOCOR) 20 MG tablet Take 20 mg by mouth at bedtime.     [name] SYNTHROID 100 MCG tablet Take 1 tablet (100 mcg total) by mouth daily before breakfast.     No current facility-administered medications for this visit.     PHYSICAL EXAMINATION: ECOG PERFORMANCE STATUS: 0 - Asymptomatic  Vitals:   08/31/18 1451  BP: (!) 174/80  Pulse: 78  Resp: 18  Temp: 97.6 F (36.4 C)  SpO2: 95%   Filed Weights   08/31/18 1451  Weight: 134 lb 4.8 oz (60.9 kg)    GENERAL: alert, no distress and comfortable SKIN: skin color, texture, turgor are normal, no rashes or significant lesions EYES: normal, Conjunctiva are pink and non-injected, sclera clear OROPHARYNX: no exudate, no erythema and lips, buccal mucosa, and tongue normal  NECK: supple, thyroid normal size, non-tender, without nodularity LYMPH: no palpable lymphadenopathy in the cervical, axillary or inguinal LUNGS: clear to auscultation and percussion with normal breathing effort HEART: regular rate & rhythm and no murmurs and no lower extremity edema ABDOMEN: abdomen soft, non-tender and normal bowel sounds MUSCULOSKELETAL: no cyanosis of digits and no clubbing  NEURO: alert & oriented x 3 with fluent speech, no focal motor/sensory deficits EXTREMITIES: No lower extremity edema BREAST: No palpable masses or nodules in either right or left breasts. No palpable axillary supraclavicular or infraclavicular adenopathy no breast tenderness or nipple discharge. (exam performed in the presence of a chaperone)  LABORATORY DATA:  I have reviewed the data as listed CMP Latest Ref Rng & Units 03/28/2018 12/26/2017 07/05/2017  Glucose 70 - 99 mg/dL 63(L) 187(H) 160(H)  BUN 6 - 23 mg/dL '16 12 9  '$ Creatinine 0.40 - 1.20 mg/dL 0.80 0.80 0.67  Sodium 135 - 145 mEq/L 131(L) 129(L) 135  Potassium 3.5 - 5.1 mEq/L 3.9 3.5 4.2  Chloride 96 - 112 mEq/L 98 95(L) 100   CO2 19 - 32 mEq/L '27 25 28  '$ Calcium 8.4 - 10.5 mg/dL 10.1 9.9 9.7  Total Protein 6.0 - 8.3 g/dL 7.6 - 7.4  Total Bilirubin 0.2 - 1.2 mg/dL 0.6 - 0.4  Alkaline Phos 39 - 117 U/L 74 - 64  AST 0 - 37 U/L 23 - 25  ALT 0 - 35 U/L 14 - 14    Lab Results  Component Value Date   WBC 7.4 12/26/2017   HGB 13.0 12/26/2017   HCT 38.2 12/26/2017   MCV 86.8 12/26/2017   PLT 270 12/26/2017   NEUTROABS 3.7 03/22/2017    ASSESSMENT & PLAN:  Primary cancer of upper outer quadrant of left female breast ([place]) history of StageIAleft breastinvasive lobular carcinoma, ER+/PR+/HER2-, diagnosed in1/2013, treated with lumpectomy,unable to tolerate Tamoxifen, and declined adjuvant radiation  Breast Cancer Surveillance: 1. Breast exam  08/31/2018: Normal 2. Mammogram  08/12/2018 No abnormalities. Postsurgical changes. Breast Density Category C. I reinforced with her that mammograms are adequate for breast density category C.  There is no need to do MRIs.  Patient wishes to follow-up with me once a year for a breast exam  and follow-up.  RTC in 1 year    No orders of the defined types were placed in this encounter.  The patient has a good understanding of the overall plan. she agrees with it. she will call with any problems that may develop before the next visit here.  Nicholas Lose, MD 08/31/2018  Julious Oka Dorshimer am acting as scribe for Dr. Nicholas Lose.  I have reviewed the above documentation for accuracy and completeness, and I agree with the above.

## 2018-08-31 ENCOUNTER — Telehealth: Payer: Self-pay | Admitting: Hematology and Oncology

## 2018-08-31 ENCOUNTER — Inpatient Hospital Stay: Payer: Medicare Other | Attending: Hematology and Oncology | Admitting: Hematology and Oncology

## 2018-08-31 DIAGNOSIS — Z79899 Other long term (current) drug therapy: Secondary | ICD-10-CM | POA: Diagnosis not present

## 2018-08-31 DIAGNOSIS — C50412 Malignant neoplasm of upper-outer quadrant of left female breast: Secondary | ICD-10-CM | POA: Insufficient documentation

## 2018-08-31 DIAGNOSIS — Z7982 Long term (current) use of aspirin: Secondary | ICD-10-CM | POA: Diagnosis not present

## 2018-08-31 DIAGNOSIS — Z170 Estrogen receptor positive status [ER+]: Secondary | ICD-10-CM | POA: Diagnosis not present

## 2018-08-31 NOTE — Telephone Encounter (Signed)
Gave avs and calendar ° °

## 2018-08-31 NOTE — Assessment & Plan Note (Signed)
history of StageIAleft breastinvasive lobular carcinoma, ER+/PR+/HER2-, diagnosed in1/2013, treated with lumpectomy,unable to tolerate Tamoxifen, and declined adjuvant radiation  Breast Cancer Surveillance: 1. Breast exam  08/31/2018: Normal 2. Mammogram  08/12/2018 No abnormalities. Postsurgical changes. Breast Density Category C. Patient wishes to follow-up with me once a year for a breast exam and follow-up.  RTC in 1 year

## 2018-09-12 ENCOUNTER — Telehealth: Payer: Self-pay | Admitting: Internal Medicine

## 2018-09-12 NOTE — Telephone Encounter (Signed)
Her kidney was slightly decreased  - I was able to see her blood work.  She should avoid all nsaids (advil, aleve) and make sure she is drinking plenty of water.  I think it is ok to wait until the CPE to recheck - we need to recheck it by blood work

## 2018-09-12 NOTE — Telephone Encounter (Signed)
Went to diabetic doctor they checked her kidney function. They stated that her kidneys had some irritation which is new. Follow up with PCP for further eval of kidney check. Wants to know if you do testing if this can be sent to [place] in [place] and wait to be seen at CPE. Or do you want her have an actual OV for this issue.

## 2018-09-12 NOTE — Telephone Encounter (Signed)
LVM for pt letting her know response below.

## 2018-09-12 NOTE — Telephone Encounter (Signed)
Copied from [place] [id number]. Topic: General - Other >> Sep 12, 2018  9:05 AM Yvette Rack wrote: Reason for CRM: Pt calling to speak with Dr Quay Burow Assistant stating that she went to go see her Kidney doctor and wanted her to have a kidney monitor and she would like to know what she should do if she need to do a urine test or what

## 2018-10-03 NOTE — Progress Notes (Signed)
Subjective:    Patient ID: [name], female    DOB: 08/11/1928, 85 y.o.   MRN: [id number]  HPI The patient is here for follow up.  Nausea:  She started having nausea about two months ago.  She vomitted several times on the 14th.  She had loose stools with that.  She denies fever at that time.  She had sharp pains across her upper abdomen over the past two months - it occurs only once in a while.  She denies GERD.  She gets full easy.  She gets nausea with eating.  The pain is not necessarily related to eating.  She is also burping a lot.  Sipping coke settles her stomach.    Chronic constipation with diarrhea:  This is controlled with metamucil daily.    Afib:  She had one episode of Afib since Christmas and it last 4 hours.  She took 1/2 of a metoprolol.  She denies SOB.     Hypertension: She is taking her medication daily. She is compliant with a low sodium diet.  She denies chest pain, palpitations, edema, shortness of breath and regular headaches. She is exercising regularly.   Hyperlipidemia: She is taking her medication daily. She is compliant with a low fat/cholesterol diet. She is exercising regularly. She denies myalgias.   Diabetes: She follows with endocrine.  She is taking her medication daily as prescribed. She is compliant with a diabetic diet. She is exercising regularly.  She checks her feet daily and denies foot lesions. She is up-to-date with an ophthalmology examination.   Hypothyroidism: She is following with endocrine.  She is taking her medication daily.  She denies any recent changes in energy or weight that are unexplained.    Medications and allergies reviewed with patient and updated if appropriate.  Patient Active Problem List   Diagnosis Date Noted  . Medication side effects, initial encounter 02/01/2018  . Atrial fibrillation ([place]) 02/01/2018  . Leg cramps 07/05/2017  . Constipation 01/25/2017  . Closed fracture of proximal end of right  humerus 07/09/2016  . Cough 06/17/2016  . AP (abdominal pain) 03/10/2016  . Hearing loss 01/03/2016  . Upper respiratory tract infection 01/03/2016  . Allergic rhinitis 01/03/2016  . Minimal Coronary Plaque by Cardiac Cath in 2013 09/11/2015  . Palpitations 09/06/2015  . Frequent loose stools 08/28/2015  . Hyponatremia   . Acute mesenteric ischemia ([place])   . Hypothyroidism 07/11/2012  . Syncope 03/18/2012  . Abnormal LFTs   . Hypercholesteremia   . Hypertension   . Diabetes mellitus type 1 ([place])   . Osteoarthritis   . Thyroid nodule   . Osteoporosis, post-menopausal   . Primary cancer of upper outer quadrant of left female breast ([place]) 08/21/2011    Current Outpatient Medications on File Prior to Visit  Medication Sig Dispense Refill  . ALPRAZolam (XANAX) 0.5 MG tablet TAKE 1/2 TABLET TWICE DAILY AS NEEDED FOR ANXIETY OR SLEEP 30 tablet 0  . aspirin EC 81 MG tablet Take 81 mg by mouth daily.    . B Complex-C (SUPER B COMPLEX PO) Take 1 tablet by mouth daily.    . BD VEO INSULIN SYRINGE U/F 31G X 15/64" 0.3 ML MISC USE WITH INJECTIONS 100 each 1  . carboxymethylcellulose (REFRESH PLUS) 0.5 % SOLN Place 1 drop into both eyes 3 (three) times daily as needed. Non-preservative    . cholecalciferol (VITAMIN D) 1000 UNITS tablet Take 5,000 Units by mouth daily.     [name]  fish oil-omega-3 fatty acids 1000 MG capsule Take 2 g by mouth daily.    . hydrochlorothiazide (MICROZIDE) 12.5 MG capsule Take 12.5 mg by mouth every other day.     . Insulin Glargine (LANTUS SOLOSTAR) 100 UNIT/ML Solostar Pen Inject 14 Units into the skin daily at 10 pm.     . insulin lispro (HUMALOG KWIKPEN) 100 UNIT/ML KiwkPen Inject into the skin. Sliding scale before meal    . losartan (COZAAR) 100 MG tablet TAKE 1 TABLET BY MOUTH ONCE DAILY. 90 tablet 0  . metoprolol succinate (TOPROL XL) 25 MG 24 hr tablet Take 0.5 tablets (12.5 mg total) by mouth as needed. AS NEEDED FOR PALPITATIONS 30 tablet 11  . Multiple  Vitamin (MULTIVITAMIN) tablet Take 1 tablet by mouth daily. For breast and bone health    . ranitidine (ZANTAC) 150 MG tablet Take 1 tablet (150 mg total) by mouth at bedtime. 90 tablet 1  . simvastatin (ZOCOR) 20 MG tablet Take 20 mg by mouth at bedtime.     [name] SYNTHROID 100 MCG tablet Take 1 tablet (100 mcg total) by mouth daily before breakfast.     No current facility-administered medications on file prior to visit.     Past Medical History:  Diagnosis Date  . Arthritis    fingers  . Cancer of upper-outer quadrant of female breast ([place]) 08/21/2011   ER +  PR +  Her 2 -  Ki67 9%   0.9 cm invasive lobular  Carcinoma ,s/p central lumpectomy with sentinel node biopsy,  ER/PR positive s/p re-excion on 09/16/11 with final pathology showing atypical hyperplasia   . CAP (community acquired pneumonia) 12/06/2016  . Diabetes mellitus    type wears insulin pump  . Diabetes mellitus type 1 ([facility])    type I- wears insulin pump  . GERD (gastroesophageal reflux disease)   . Hypercholesteremia   . Hypertension   . Hyponatremia   . Hypothyroid   . Hypothyroidism   . Osteoarthritis   . Osteoporosis, post-menopausal    DEXA 12/02/11: -3.5 (with lomax gyn), declines bisphos due to bone pain  . PAF (paroxysmal atrial fibrillation) ([place])    One episode in 2008, spontaneously converted in the ED, no further treatment  . Spondylosis    thoracic spine  . Thyroid nodule dx 07/2011   [place] q 52moto follow calcified L thyroid nodule (12/08/11 [place]    Past Surgical History:  Procedure Laterality Date  . ABDOMINAL HYSTERECTOMY    . ABDOMINAL HYSTERECTOMY  yrs ago  . APPENDECTOMY    . APPENDECTOMY  age 85 . BLADDER REPAIR  5 yrs ago  . BREAST LUMPECTOMY    . BREAST SURGERY     biopsy  . BREAST SURGERY   yrs ago   br bx  . BREAST SURGERY  01/ 23/2013   left central lumpectomy and snbx, re-excision lumpectomy  . CARDIOVASCULAR STRESS TEST  03/17/2012  . LEFT HEART CATHETERIZATION WITH CORONARY ANGIOGRAM  N/A 03/18/2012   Procedure: LEFT HEART CATHETERIZATION WITH CORONARY ANGIOGRAM;  Surgeon: JMinus Breeding MD;  [facility];  Service: Cardiovascular;  Laterality: N/A;  . TONSILLECTOMY   age 85   Social History   Socioeconomic History  . Marital status: Married    Spouse name: Not on file  . Number of children: Not on file  . Years of education: Not on file  . Highest education level: Not on file  Occupational History  . Not on file  Social Needs  . Financial resource strain: Not on file  . Food insecurity:    Worry: Not on file    Inability: Not on file  . Transportation needs:    Medical: Not on file    Non-medical: Not on file  Tobacco Use  . Smoking status: Former Smoker    Packs/day: 1.00    Years: 10.00    Pack years: 10.00    Types: Cigarettes    Last attempt to quit: 08/27/1977    Years since quitting: 41.1  . Smokeless tobacco: Never Used  Substance and Sexual Activity  . Alcohol use: Yes    Comment: rare  . Drug use: No  . Sexual activity: Yes    Birth control/protection: Post-menopausal  Lifestyle  . Physical activity:    Days per week: Not on file    Minutes per session: Not on file  . Stress: Not on file  Relationships  . Social connections:    Talks on phone: Not on file    Gets together: Not on file    Attends religious service: Not on file    Active member of club or organization: Not on file    Attends meetings of clubs or organizations: Not on file    Relationship status: Not on file  Other Topics Concern  . Not on file  Social History Narrative   ** Merged History Encounter **        Family History  Problem Relation Age of Onset  . Cancer Sister        unknown  . Hypertension Mother   . Stroke Mother   . Arthritis Father   . Heart disease Father   . Diabetes Son     Review of Systems  Constitutional: Negative for fever.  HENT: Positive for congestion.   Respiratory: Negative for cough, shortness of breath and wheezing.    Cardiovascular: Negative for chest pain, palpitations and leg swelling.  Gastrointestinal: Positive for abdominal pain and nausea. Negative for anal bleeding and blood in stool.  Neurological: Positive for headaches (frontal). Negative for light-headedness.       Objective:   Vitals:   10/04/18 1440  BP: 126/74  Pulse: 79  Resp: 16  Temp: 98.2 F (36.8 C)  SpO2: 95%   BP Readings from Last 3 Encounters:  10/04/18 126/74  08/31/18 (!) 174/80  07/18/18 (!) 142/78   Wt Readings from Last 3 Encounters:  10/04/18 136 lb (61.7 kg)  08/31/18 134 lb 4.8 oz (60.9 kg)  07/18/18 141 lb (64 kg)   Body mass index is 21.3 kg/m.   Physical Exam    Constitutional: Appears well-developed and well-nourished. No distress.  HENT:  Head: Normocephalic and atraumatic.  Neck: Neck supple. No tracheal deviation present. No thyromegaly present.  No cervical lymphadenopathy Cardiovascular: Normal rate, regular rhythm and normal heart sounds.   No murmur heard. No carotid bruit .  No edema Pulmonary/Chest: Effort normal and breath sounds normal. No respiratory distress. No has no wheezes. No rales. Abdomen: Nondistended, soft, mild epigastric tenderness without rebound or guarding, minimal left upper quadrant tenderness without rebound or guarding, no right upper quadrant tenderness Skin: Skin is warm and dry. Not diaphoretic.  Psychiatric: Normal mood and affect. Behavior is normal.      Assessment & Plan:    See Problem List for Assessment and Plan of chronic medical problems.

## 2018-10-04 ENCOUNTER — Encounter: Payer: Self-pay | Admitting: Internal Medicine

## 2018-10-04 ENCOUNTER — Ambulatory Visit: Payer: Medicare Other | Admitting: Internal Medicine

## 2018-10-04 ENCOUNTER — Other Ambulatory Visit (INDEPENDENT_AMBULATORY_CARE_PROVIDER_SITE_OTHER): Payer: Medicare Other

## 2018-10-04 VITALS — BP 126/74 | HR 79 | Temp 98.2°F | Resp 16 | Ht 67.0 in | Wt 136.0 lb

## 2018-10-04 DIAGNOSIS — E1043 Type 1 diabetes mellitus with diabetic autonomic (poly)neuropathy: Secondary | ICD-10-CM | POA: Diagnosis not present

## 2018-10-04 DIAGNOSIS — R110 Nausea: Secondary | ICD-10-CM | POA: Diagnosis not present

## 2018-10-04 DIAGNOSIS — I10 Essential (primary) hypertension: Secondary | ICD-10-CM

## 2018-10-04 DIAGNOSIS — I480 Paroxysmal atrial fibrillation: Secondary | ICD-10-CM

## 2018-10-04 DIAGNOSIS — R1010 Upper abdominal pain, unspecified: Secondary | ICD-10-CM | POA: Diagnosis not present

## 2018-10-04 DIAGNOSIS — E039 Hypothyroidism, unspecified: Secondary | ICD-10-CM

## 2018-10-04 LAB — CBC WITH DIFFERENTIAL/PLATELET
BASOS ABS: 0 10*3/uL (ref 0.0–0.1)
Basophils Relative: 0.6 % (ref 0.0–3.0)
Eosinophils Absolute: 0.2 10*3/uL (ref 0.0–0.7)
Eosinophils Relative: 2.1 % (ref 0.0–5.0)
HCT: 38.3 % (ref 36.0–46.0)
HEMOGLOBIN: 13.1 g/dL (ref 12.0–15.0)
Lymphocytes Relative: 24.5 % (ref 12.0–46.0)
Lymphs Abs: 2 10*3/uL (ref 0.7–4.0)
MCHC: 34.2 g/dL (ref 30.0–36.0)
MCV: 88.8 fl (ref 78.0–100.0)
MONO ABS: 0.7 10*3/uL (ref 0.1–1.0)
Monocytes Relative: 8.8 % (ref 3.0–12.0)
Neutro Abs: 5.1 10*3/uL (ref 1.4–7.7)
Neutrophils Relative %: 64 % (ref 43.0–77.0)
Platelets: 278 10*3/uL (ref 150.0–400.0)
RBC: 4.31 Mil/uL (ref 3.87–5.11)
RDW: 13.1 % (ref 11.5–15.5)
WBC: 8 10*3/uL (ref 4.0–10.5)

## 2018-10-04 LAB — COMPREHENSIVE METABOLIC PANEL
ALBUMIN: 4.2 g/dL (ref 3.5–5.2)
ALK PHOS: 77 U/L (ref 39–117)
ALT: 13 U/L (ref 0–35)
AST: 23 U/L (ref 0–37)
BUN: 16 mg/dL (ref 6–23)
CO2: 29 mEq/L (ref 19–32)
CREATININE: 0.94 mg/dL (ref 0.40–1.20)
Calcium: 9.8 mg/dL (ref 8.4–10.5)
Chloride: 95 mEq/L — ABNORMAL LOW (ref 96–112)
GFR: 56.29 mL/min — ABNORMAL LOW (ref >60.00)
Glucose, Bld: 195 mg/dL — ABNORMAL HIGH (ref 70–99)
POTASSIUM: 3.5 meq/L (ref 3.5–5.1)
SODIUM: 132 meq/L — AB (ref 135–145)
TOTAL PROTEIN: 7.8 g/dL (ref 6.0–8.3)
Total Bilirubin: 0.5 mg/dL (ref 0.2–1.2)

## 2018-10-04 LAB — LIPID PANEL
CHOLESTEROL: 249 mg/dL — AB (ref 0–200)
HDL: 49.3 mg/dL (ref >39.00)
LDL Cholesterol: 161 mg/dL — ABNORMAL HIGH (ref 0–99)
NonHDL: 199.61
Total CHOL/HDL Ratio: 5
Triglycerides: 193 mg/dL — ABNORMAL HIGH (ref 0.0–149.0)
VLDL: 38.6 mg/dL (ref 0.0–40.0)

## 2018-10-04 LAB — HEMOGLOBIN A1C: HEMOGLOBIN A1C: 7.4 % — AB (ref 4.6–6.5)

## 2018-10-04 LAB — LIPASE: Lipase: 7 U/L — ABNORMAL LOW (ref 11.0–59.0)

## 2018-10-04 LAB — AMYLASE: Amylase: 45 U/L (ref 27–131)

## 2018-10-04 MED ORDER — ALPRAZOLAM 0.5 MG PO TABS: ORAL_TABLET | ORAL | 0 refills | Status: DC

## 2018-10-04 MED ORDER — PANTOPRAZOLE SODIUM 40 MG PO TBEC
40.0000 mg | DELAYED_RELEASE_TABLET | Freq: Every day | ORAL | 5 refills | Status: DC
Start: 2018-10-04 — End: 2019-10-04

## 2018-10-04 MED ORDER — SIMVASTATIN 20 MG PO TABS: 20.0000 mg | ORAL_TABLET | Freq: Every day | ORAL | 1 refills | Status: DC

## 2018-10-04 NOTE — Patient Instructions (Addendum)
  Tests ordered today. Your results will be released to [place] (or called to you) after review, usually within 72hours after test completion. If any changes need to be made, you will be notified at that same time.    Medications reviewed and updated.  Changes include :   Starting pantoprazole 40 mg daily - take 30 minutes prior to a meal.    Your prescription(s) have been submitted to your pharmacy. Please take as directed and contact our office if you believe you are having problem(s) with the medication(s).   An Ultrasound of your abdomen was ordered -someone will call you to schedule this.    Please followup in 6 months, sooner if symptoms do not improve

## 2018-10-04 NOTE — Assessment & Plan Note (Signed)
Management per endocrine 

## 2018-10-04 NOTE — Assessment & Plan Note (Signed)
BP well controlled Current regimen effective and well tolerated Continue current medications at current doses cmp  

## 2018-10-04 NOTE — Assessment & Plan Note (Signed)
Experiencing nausea, of the upper abdominal pain and burping Likely atypical GERD Start pantoprazole 40 mg daily-once GERD is controlled we will try tapering her off the medication Will obtain ultrasound of abdomen to rule out gallbladder disease CBC, CMP, amylase, lipase

## 2018-10-04 NOTE — Assessment & Plan Note (Signed)
Following with endocrine-management per them Sugars overall well controlled

## 2018-10-04 NOTE — Assessment & Plan Note (Signed)
Had only one episode since Christmas-lasted for hours-she did take half of metoprolol No other episodes Following with cardiology Not currently on anticoagulation-did not tolerate Eliquis or Xarelto-takes aspirin 81 mg daily

## 2018-10-05 ENCOUNTER — Encounter: Payer: Self-pay | Admitting: Internal Medicine

## 2018-10-12 ENCOUNTER — Ambulatory Visit
Admission: RE | Admit: 2018-10-12 | Discharge: 2018-10-12 | Disposition: A | Discharge status: Home / Self Care | Payer: Medicare Other | Source: Ambulatory Visit | Attending: Internal Medicine | Admitting: Internal Medicine

## 2018-10-12 DIAGNOSIS — R1010 Upper abdominal pain, unspecified: Secondary | ICD-10-CM

## 2018-10-13 ENCOUNTER — Other Ambulatory Visit: Payer: Self-pay | Admitting: Internal Medicine

## 2018-10-17 ENCOUNTER — Encounter: Payer: Self-pay | Admitting: Internal Medicine

## 2018-11-10 ENCOUNTER — Other Ambulatory Visit: Payer: Self-pay | Admitting: Internal Medicine

## 2018-11-11 MED ORDER — ALPRAZOLAM 0.5 MG PO TABS: ORAL_TABLET | ORAL | 0 refills | Status: DC

## 2018-11-11 NOTE — Telephone Encounter (Signed)
Last refill was 10/04/18 Last OV 10/04/18 Next OV 04/04/19

## 2018-12-07 ENCOUNTER — Encounter: Payer: Self-pay | Admitting: Internal Medicine

## 2018-12-08 NOTE — Progress Notes (Signed)
Virtual Visit via Telephone Note  She does not have the technology to do a video visit.  I connected with [name] on 12/09/18 at  9:30 AM EDT by telephone and verified that I am speaking with the correct person using two identifiers.   I discussed the limitations of evaluation and management by telemedicine and the availability of in person appointments. The patient expressed understanding and agreed to proceed.  The patient is currently at home and I am in the office.    No referring provider.    History of Present Illness: This is an acute visit for stomach issues.   She suffers from chronic constipation that presented as diarrhea in the past.  She has been taking metamucil daily and that has controlled her symptoms for a while.  Now she feels her intestines are filled with so much gas all the time and she has a constant pressure in her abdomen and rectum.  It is very uncomfortable, especially in her upper abdomen.  She has to strain to get the gas to come out.  No matter how hard she strains she can not get the gas to come out of her rectum.  She has small amounts of feces, but her bowel movements are nowhere near normal.  She has a lot of noises from her intestines.    She does have a lot of anxiety now regarding COVID.  Her husband also recently had a stroke and lost vision in 1 of his eyes.  She is talking to her preacher and that is helping with some of her anxiety.  She is drinking plenty of water.  She denies any changes in diet.  She denies any changes in medication.  Her sugars are well controlled.  She does feel like her skin is not the same and she has increased inflammation in her joints and wonders if she is getting the nutrients that she needs-they have been not been absorbed.   Review of Systems  Constitutional: Negative for chills and fever.       Decreased appetite  Respiratory: Negative for cough, shortness of breath and wheezing.   Cardiovascular:  Negative for chest pain and palpitations.  Gastrointestinal: Positive for abdominal pain (upper abdomen) and constipation. Negative for nausea.  Neurological: Negative for dizziness and headaches.      Social History   Socioeconomic History  . Marital status: Married    Spouse name: Not on file  . Number of children: Not on file  . Years of education: Not on file  . Highest education level: Not on file  Occupational History  . Not on file  Social Needs  . Financial resource strain: Not on file  . Food insecurity:    Worry: Not on file    Inability: Not on file  . Transportation needs:    Medical: Not on file    Non-medical: Not on file  Tobacco Use  . Smoking status: Former Smoker    Packs/day: 1.00    Years: 10.00    Pack years: 10.00    Types: Cigarettes    Last attempt to quit: 08/27/1977    Years since quitting: 41.3  . Smokeless tobacco: Never Used  Substance and Sexual Activity  . Alcohol use: Yes    Comment: rare  . Drug use: No  . Sexual activity: Yes    Birth control/protection: Post-menopausal  Lifestyle  . Physical activity:    Days per week: Not on file    Minutes  per session: Not on file  . Stress: Not on file  Relationships  . Social connections:    Talks on phone: Not on file    Gets together: Not on file    Attends religious service: Not on file    Active member of club or organization: Not on file    Attends meetings of clubs or organizations: Not on file    Relationship status: Not on file  Other Topics Concern  . Not on file  Social History Narrative   ** Merged History Encounter **         Observations/Objective:   Assessment and Plan:  See Problem List for Assessment and Plan of chronic medical problems.   Follow Up Instructions:    I discussed the assessment and treatment plan with the patient. The patient was provided an opportunity to ask questions and all were answered. The patient agreed with the plan and demonstrated an  understanding of the instructions.   The patient was advised to call back or seek an in-person evaluation if the symptoms worsen or if the condition fails to improve as anticipated.    Binnie Rail, MD

## 2018-12-09 ENCOUNTER — Encounter: Payer: Self-pay | Admitting: Internal Medicine

## 2018-12-09 ENCOUNTER — Ambulatory Visit (INDEPENDENT_AMBULATORY_CARE_PROVIDER_SITE_OTHER): Payer: Medicare Other | Admitting: Internal Medicine

## 2018-12-09 DIAGNOSIS — K5900 Constipation, unspecified: Secondary | ICD-10-CM

## 2018-12-09 NOTE — Assessment & Plan Note (Signed)
Experiencing increased gas, upper abdominal pain and decreased bowel movements Symptoms consistent with constipation Taking Metamucil daily -- try increasing to twice a day Start miralax daily Continue to drink plenty of water  She will let me know if her symptoms do not improve with the above

## 2018-12-21 LAB — HM DIABETES EYE EXAM

## 2018-12-26 ENCOUNTER — Other Ambulatory Visit: Payer: Self-pay | Admitting: Internal Medicine

## 2018-12-26 MED ORDER — ALPRAZOLAM 0.5 MG PO TABS: ORAL_TABLET | ORAL | 0 refills | Status: DC

## 2018-12-26 NOTE — Telephone Encounter (Signed)
Check [place] registry last filled 11/11/2018.Marland KitchenJohny Chess

## 2019-01-12 ENCOUNTER — Ambulatory Visit: Payer: Medicare Other | Admitting: Internal Medicine

## 2019-01-16 ENCOUNTER — Other Ambulatory Visit: Payer: Self-pay

## 2019-01-16 ENCOUNTER — Encounter: Payer: Self-pay | Admitting: Internal Medicine

## 2019-01-16 ENCOUNTER — Ambulatory Visit (INDEPENDENT_AMBULATORY_CARE_PROVIDER_SITE_OTHER): Payer: Medicare Other | Admitting: Internal Medicine

## 2019-01-16 VITALS — BP 146/82 | HR 82 | Temp 99.0°F | Resp 16 | Ht 67.0 in | Wt 135.0 lb

## 2019-01-16 DIAGNOSIS — R110 Nausea: Secondary | ICD-10-CM | POA: Diagnosis not present

## 2019-01-16 DIAGNOSIS — E1043 Type 1 diabetes mellitus with diabetic autonomic (poly)neuropathy: Secondary | ICD-10-CM

## 2019-01-16 DIAGNOSIS — E039 Hypothyroidism, unspecified: Secondary | ICD-10-CM

## 2019-01-16 DIAGNOSIS — K5900 Constipation, unspecified: Secondary | ICD-10-CM

## 2019-01-16 DIAGNOSIS — R350 Frequency of micturition: Secondary | ICD-10-CM

## 2019-01-16 MED ORDER — BISACODYL 10 MG RE SUPP: 10.0000 mg | RECTAL | 0 refills | Status: DC | PRN

## 2019-01-16 MED ORDER — ONDANSETRON HCL 4 MG PO TABS: 4.0000 mg | ORAL_TABLET | Freq: Three times a day (TID) | ORAL | 0 refills | Status: DC | PRN

## 2019-01-16 NOTE — Assessment & Plan Note (Signed)
Check TSH Management per endocrine

## 2019-01-16 NOTE — Assessment & Plan Note (Signed)
Check A1c Management per endocrine

## 2019-01-16 NOTE — Patient Instructions (Signed)
  Tests ordered today. Your results will be released to [place] (or called to you) after review, usually within 72hours after test completion. If any changes need to be made, you will be notified at that same time.   Medications reviewed and updated.  Changes include :   Take the anti-nausea medication as needed.  Increase your fluids.  Use the suppositories as needed for constipation.   Your prescription(s) have been submitted to your pharmacy. Please take as directed and contact our office if you believe you are having problem(s) with the medication(s).

## 2019-01-16 NOTE — Assessment & Plan Note (Signed)
Has been experiencing nausea since yesterday morning Sounds like she had an episode of GERD, which has improved and she is also having increased constipation, which is likely contributing Zofran as needed Continue pantoprazole-she feels her GERD is overall controlled Suppositories for constipation

## 2019-01-16 NOTE — Progress Notes (Signed)
Subjective:    Patient ID: [name], female    DOB: 22-Dec-1928, 85 y.o.   MRN: [id number]  HPI The patient is here for an acute visit.   Burning in stomach:  She woke up yesterday with burning in her stomach.  She does not recall eating anything the night before that could have caused this.  When she did wake up she did not have a pillow under her head and typically she sleeps slightly elevated.  She drank mylanta and drank some milk and that helped.  She denies burning since then, but has been nauseous.  She has not had an appetite since this time.  She has headaches.  She has been drinking Pedialyte, but has not been eating or drinking normally.  Constipation: She has a long history of chronic constipation.  She is taking the fiber supplement daily.  She has been alternating between Mylanta and MiraLAX daily to help.  She is still constipated.  She feels upper abdominal bloating and distention, and increased gas.    Urine frequency: She also notes increased urinary frequency.  She does have some mild dysuria or discomfort with urination.  She denies any change in the color, blood in the urine or odor.  She denies any fevers or chills.  Hypothyroidism: She is taking her medication daily.  She has not seen her endocrinologist in a while and wondered if her thyroid could be checked.  Diabetes: She thinks her sugars have been well controlled-she does monitor them closely at home.  She has not had her A1c checked in a while because she has not seen her endocrinologist.  Medications and allergies reviewed with patient and updated if appropriate.  Patient Active Problem List   Diagnosis Date Noted  . Nausea 10/04/2018  . Upper abdominal pain 10/04/2018  . Medication side effects, initial encounter 02/01/2018  . Atrial fibrillation ([place]) 02/01/2018  . Leg cramps 07/05/2017  . Constipation 01/25/2017  . Closed fracture of proximal end of right humerus 07/09/2016  . Cough  06/17/2016  . AP (abdominal pain) 03/10/2016  . Hearing loss 01/03/2016  . Allergic rhinitis 01/03/2016  . Minimal Coronary Plaque by Cardiac Cath in 2013 09/11/2015  . Palpitations 09/06/2015  . Frequent loose stools 08/28/2015  . Hyponatremia   . Acute mesenteric ischemia ([place])   . Hypothyroidism 07/11/2012  . Syncope 03/18/2012  . Abnormal LFTs   . Hypercholesteremia   . Hypertension   . Diabetes mellitus type 1 ([place])   . Osteoarthritis   . Thyroid nodule   . Osteoporosis, post-menopausal   . Primary cancer of upper outer quadrant of left female breast ([place]) 08/21/2011    Current Outpatient Medications on File Prior to Visit  Medication Sig Dispense Refill  . ALPRAZolam (XANAX) 0.5 MG tablet Take 1/2 tablet twice daily as needed for anxiety or sleep. 30 tablet 0  . aspirin EC 81 MG tablet Take 81 mg by mouth daily.    . B Complex-C (SUPER B COMPLEX PO) Take 1 tablet by mouth daily.    . BD VEO INSULIN SYRINGE U/F 31G X 15/64" 0.3 ML MISC USE WITH INJECTIONS 100 each 1  . carboxymethylcellulose (REFRESH PLUS) 0.5 % SOLN Place 1 drop into both eyes 3 (three) times daily as needed. Non-preservative    . cholecalciferol (VITAMIN D) 1000 UNITS tablet Take 5,000 Units by mouth daily.     . fish oil-omega-3 fatty acids 1000 MG capsule Take 2 g by mouth daily.    [name]  hydrochlorothiazide (MICROZIDE) 12.5 MG capsule Take 12.5 mg by mouth every other day.     . Insulin Glargine (LANTUS SOLOSTAR) 100 UNIT/ML Solostar Pen Inject 14 Units into the skin daily at 10 pm.     . insulin lispro (HUMALOG KWIKPEN) 100 UNIT/ML KiwkPen Inject into the skin. Sliding scale before meal    . losartan (COZAAR) 100 MG tablet TAKE 1 TABLET BY MOUTH ONCE DAILY. 90 tablet 0  . metoprolol succinate (TOPROL XL) 25 MG 24 hr tablet Take 0.5 tablets (12.5 mg total) by mouth as needed. AS NEEDED FOR PALPITATIONS 30 tablet 11  . Multiple Vitamin (MULTIVITAMIN) tablet Take 1 tablet by mouth daily. For breast and bone  health    . pantoprazole (PROTONIX) 40 MG tablet Take 1 tablet (40 mg total) by mouth daily. 30 tablet 5  . simvastatin (ZOCOR) 20 MG tablet Take 1 tablet (20 mg total) by mouth at bedtime. 90 tablet 1  . SYNTHROID 100 MCG tablet Take 1 tablet (100 mcg total) by mouth daily before breakfast.     No current facility-administered medications on file prior to visit.     Past Medical History:  Diagnosis Date  . Arthritis    fingers  . Cancer of upper-outer quadrant of female breast ([place]) 08/21/2011   ER +  PR +  Her 2 -  Ki67 9%   0.9 cm invasive lobular  Carcinoma ,s/p central lumpectomy with sentinel node biopsy,  ER/PR positive s/p re-excion on 09/16/11 with final pathology showing atypical hyperplasia   . CAP (community acquired pneumonia) 12/06/2016  . Diabetes mellitus    type wears insulin pump  . Diabetes mellitus type 1 (HCC)    type I- wears insulin pump  . GERD (gastroesophageal reflux disease)   . Hypercholesteremia   . Hypertension   . Hyponatremia   . Hypothyroid   . Hypothyroidism   . Osteoarthritis   . Osteoporosis, post-menopausal    DEXA 12/02/11: -3.5 (with lomax gyn), declines bisphos due to bone pain  . PAF (paroxysmal atrial fibrillation) ([place])    One episode in 2008, spontaneously converted in the ED, no further treatment  . Spondylosis    thoracic spine  . Thyroid nodule dx 07/2011   [place] q 44moto follow calcified L thyroid nodule (12/08/11 [place]    Past Surgical History:  Procedure Laterality Date  . ABDOMINAL HYSTERECTOMY    . ABDOMINAL HYSTERECTOMY  yrs ago  . APPENDECTOMY    . APPENDECTOMY  age 85 . BLADDER REPAIR  5 yrs ago  . BREAST LUMPECTOMY    . BREAST SURGERY     biopsy  . BREAST SURGERY   yrs ago   br bx  . BREAST SURGERY  01/ 23/2013   left central lumpectomy and snbx, re-excision lumpectomy  . CARDIOVASCULAR STRESS TEST  03/17/2012  . LEFT HEART CATHETERIZATION WITH CORONARY ANGIOGRAM N/A 03/18/2012   Procedure: LEFT HEART CATHETERIZATION WITH  CORONARY ANGIOGRAM;  Surgeon: JMinus Breeding MD;  [facility];  Service: Cardiovascular;  Laterality: N/A;  . TONSILLECTOMY   age 85   Social History   Socioeconomic History  . Marital status: Married    Spouse name: Not on file  . Number of children: Not on file  . Years of education: Not on file  . Highest education level: Not on file  Occupational History  . Not on file  Social Needs  . Financial resource strain: Not on file  . Food insecurity:  Worry: Not on file    Inability: Not on file  . Transportation needs:    Medical: Not on file    Non-medical: Not on file  Tobacco Use  . Smoking status: Former Smoker    Packs/day: 1.00    Years: 10.00    Pack years: 10.00    Types: Cigarettes    Last attempt to quit: 08/27/1977    Years since quitting: 41.4  . Smokeless tobacco: Never Used  Substance and Sexual Activity  . Alcohol use: Yes    Comment: rare  . Drug use: No  . Sexual activity: Yes    Birth control/protection: Post-menopausal  Lifestyle  . Physical activity:    Days per week: Not on file    Minutes per session: Not on file  . Stress: Not on file  Relationships  . Social connections:    Talks on phone: Not on file    Gets together: Not on file    Attends religious service: Not on file    Active member of club or organization: Not on file    Attends meetings of clubs or organizations: Not on file    Relationship status: Not on file  Other Topics Concern  . Not on file  Social History Narrative   ** Merged History Encounter **        Family History  Problem Relation Age of Onset  . Cancer Sister        unknown  . Hypertension Mother   . Stroke Mother   . Arthritis Father   . Heart disease Father   . Diabetes Son     Review of Systems  Constitutional: Positive for appetite change. Negative for fever.  Gastrointestinal: Positive for abdominal pain (lower abdominal cramps), constipation (fairly controlled) and nausea. Negative for  diarrhea.  Genitourinary: Positive for dysuria and frequency. Negative for hematuria.       No urine odor or change in color  Neurological: Positive for light-headedness.       Objective:   Vitals:   01/16/19 1510  BP: (!) 146/82  Pulse: 82  Resp: 16  Temp: 99 F (37.2 C)  SpO2: 95%   BP Readings from Last 3 Encounters:  01/16/19 (!) 146/82  10/04/18 126/74  08/31/18 (!) 174/80   Wt Readings from Last 3 Encounters:  01/16/19 135 lb (61.2 kg)  10/04/18 136 lb (61.7 kg)  08/31/18 134 lb 4.8 oz (60.9 kg)   Body mass index is 21.14 kg/m.   Physical Exam Constitutional:      General: She is not in acute distress.    Appearance: She is well-developed. She is not ill-appearing.  HENT:     Head: Normocephalic and atraumatic.  Abdominal:     General: There is no distension.     Palpations: Abdomen is soft.     Tenderness: There is abdominal tenderness (Mild tenderness left upper quadrant) in the left upper quadrant. There is no right CVA tenderness, left CVA tenderness, guarding or rebound.  Skin:    General: Skin is warm and dry.  Neurological:     Mental Status: She is alert.            Assessment & Plan:    See Problem List for Assessment and Plan of chronic medical problems.

## 2019-01-16 NOTE — Assessment & Plan Note (Signed)
Acute on chronic constipation She currently is experiencing increased constipation and that likely is contributing to her abdominal bloating, distention and nausea Zofran as needed Dulcolax suppositories as needed Continue fiber supplementation Can continue Mylanta or MiraLAX

## 2019-01-25 ENCOUNTER — Other Ambulatory Visit (INDEPENDENT_AMBULATORY_CARE_PROVIDER_SITE_OTHER): Payer: Medicare Other

## 2019-01-25 ENCOUNTER — Encounter: Payer: Self-pay | Admitting: Internal Medicine

## 2019-01-25 DIAGNOSIS — K5900 Constipation, unspecified: Secondary | ICD-10-CM

## 2019-01-25 DIAGNOSIS — E039 Hypothyroidism, unspecified: Secondary | ICD-10-CM

## 2019-01-25 DIAGNOSIS — R350 Frequency of micturition: Secondary | ICD-10-CM | POA: Diagnosis not present

## 2019-01-25 DIAGNOSIS — E1043 Type 1 diabetes mellitus with diabetic autonomic (poly)neuropathy: Secondary | ICD-10-CM | POA: Diagnosis not present

## 2019-01-25 DIAGNOSIS — R110 Nausea: Secondary | ICD-10-CM

## 2019-01-25 LAB — CBC WITH DIFFERENTIAL/PLATELET
Basophils Absolute: 0 10*3/uL (ref 0.0–0.1)
Basophils Relative: 0.6 % (ref 0.0–3.0)
Eosinophils Absolute: 0.1 10*3/uL (ref 0.0–0.7)
Eosinophils Relative: 1.4 % (ref 0.0–5.0)
HCT: 37.8 % (ref 36.0–46.0)
Hemoglobin: 13 g/dL (ref 12.0–15.0)
Lymphocytes Relative: 33 % (ref 12.0–46.0)
Lymphs Abs: 2.7 10*3/uL (ref 0.7–4.0)
MCHC: 34.4 g/dL (ref 30.0–36.0)
MCV: 89.1 fl (ref 78.0–100.0)
Monocytes Absolute: 0.9 10*3/uL (ref 0.1–1.0)
Monocytes Relative: 10.8 % (ref 3.0–12.0)
Neutro Abs: 4.4 10*3/uL (ref 1.4–7.7)
Neutrophils Relative %: 54.2 % (ref 43.0–77.0)
Platelets: 219 10*3/uL (ref 150.0–400.0)
RBC: 4.24 Mil/uL (ref 3.87–5.11)
RDW: 12.5 % (ref 11.5–15.5)
WBC: 8.1 10*3/uL (ref 4.0–10.5)

## 2019-01-25 LAB — COMPREHENSIVE METABOLIC PANEL
ALT: 15 U/L (ref 0–35)
AST: 24 U/L (ref 0–37)
Albumin: 4.2 g/dL (ref 3.5–5.2)
Alkaline Phosphatase: 79 U/L (ref 39–117)
BUN: 15 mg/dL (ref 6–23)
CO2: 26 mEq/L (ref 19–32)
Calcium: 10 mg/dL (ref 8.4–10.5)
Chloride: 98 mEq/L (ref 96–112)
Creatinine, Ser: 0.7 mg/dL (ref 0.40–1.20)
GFR: 79.04 mL/min (ref >60.00)
Glucose, Bld: 122 mg/dL — ABNORMAL HIGH (ref 70–99)
Potassium: 4 mEq/L (ref 3.5–5.1)
Sodium: 131 mEq/L — ABNORMAL LOW (ref 135–145)
Total Bilirubin: 0.4 mg/dL (ref 0.2–1.2)
Total Protein: 7.5 g/dL (ref 6.0–8.3)

## 2019-01-25 LAB — TSH: TSH: 6.13 u[IU]/mL — ABNORMAL HIGH (ref 0.35–4.50)

## 2019-01-25 LAB — HEMOGLOBIN A1C: Hgb A1c MFr Bld: 7.6 % — ABNORMAL HIGH (ref 4.6–6.5)

## 2019-01-27 ENCOUNTER — Encounter: Payer: Self-pay | Admitting: Internal Medicine

## 2019-01-27 LAB — URINE CULTURE
MICRO NUMBER:: 579154
SPECIMEN QUALITY:: ADEQUATE

## 2019-02-06 ENCOUNTER — Other Ambulatory Visit: Payer: Self-pay | Admitting: Internal Medicine

## 2019-02-06 MED ORDER — LOSARTAN POTASSIUM 100 MG PO TABS: 100.0000 mg | ORAL_TABLET | Freq: Every day | ORAL | 0 refills | Status: DC

## 2019-02-06 MED ORDER — ALPRAZOLAM 0.5 MG PO TABS: ORAL_TABLET | ORAL | 0 refills | Status: DC

## 2019-02-06 NOTE — Telephone Encounter (Signed)
[place], [place] [address]  Phone: [phone] Fax: [phone]   Pt needs refill routed here instead for meds without dye. Please advise

## 2019-02-06 NOTE — Telephone Encounter (Signed)
Routing to CMA 

## 2019-02-06 NOTE — Telephone Encounter (Signed)
Last OV was 03/29/19 Next OV 04/04/19 Last RF 12/26/18

## 2019-02-08 ENCOUNTER — Encounter: Payer: Self-pay | Admitting: Internal Medicine

## 2019-02-17 ENCOUNTER — Ambulatory Visit: Payer: Medicare Other

## 2019-02-22 ENCOUNTER — Ambulatory Visit: Payer: Medicare Other

## 2019-02-23 ENCOUNTER — Encounter: Payer: Self-pay | Admitting: Internal Medicine

## 2019-02-23 DIAGNOSIS — B349 Viral infection, unspecified: Secondary | ICD-10-CM

## 2019-02-24 ENCOUNTER — Other Ambulatory Visit: Payer: Self-pay

## 2019-02-24 DIAGNOSIS — Z20822 Contact with and (suspected) exposure to covid-19: Secondary | ICD-10-CM

## 2019-03-02 ENCOUNTER — Encounter: Payer: Self-pay | Admitting: Internal Medicine

## 2019-03-02 LAB — NOVEL CORONAVIRUS, NAA: SARS-CoV-2, NAA: NOT DETECTED

## 2019-03-16 ENCOUNTER — Other Ambulatory Visit: Payer: Self-pay | Admitting: Internal Medicine

## 2019-03-17 ENCOUNTER — Other Ambulatory Visit: Payer: Self-pay | Admitting: Internal Medicine

## 2019-03-17 MED ORDER — ALPRAZOLAM 0.5 MG PO TABS: ORAL_TABLET | ORAL | 0 refills | Status: DC

## 2019-03-17 NOTE — Telephone Encounter (Signed)
Check [facility] last filled 02/06/2019../lmb  

## 2019-03-20 NOTE — Telephone Encounter (Signed)
Call patient - her med list says 12.5 mg every other day -- what is she taking?   We need to be careful with this medication because it has caused her sodium level to be too low.

## 2019-03-20 NOTE — Telephone Encounter (Signed)
Not sure if she should be taking this or not currently.

## 2019-03-22 NOTE — Telephone Encounter (Signed)
She takes 12.5 mg sometimes every 3 days. She only takes it if her BP is up which is not often. She takes half of the 25 mg. She would like to keep getting the 25 if possible so they last but she said if you prefer she has the 12.5 sent it that is fine too.

## 2019-04-03 NOTE — Progress Notes (Signed)
Subjective:    Patient ID: [name], female    DOB: 10-Dec-1928, 85 y.o.   MRN: [id number]  HPI The patient is here for follow up.  She has epigastric bloating and is very uncomfortable.  Feels like crampy epigastric region is hard and she is concerned that it is going to compress her heart.  She is extremely uncomfortable.  She has decreased appetite and some nausea.  Chronic constipation:  She take metamucil daily.  She has a lot of gas and urgency.  She has a lot of gurgling.  Her stools are small and soft-she has not had formed stool.  She only has a normal, larger BM once a week.  Has taken the suppositories without much relief.  Afib, Hypertension: She is taking her medication daily. She is compliant with a low sodium diet.  She has had some chest tightness recently, but feels it is related to her gastrointestinal symptoms.  She had an episode of atrial fibrillation a few nights ago that lasted several hours.  It resolved on its own.  She did have palpitations and shortness of breath with that episode.  She does have an upcoming appointment with cardiology.  Hyperlipidemia: She is taking her medication daily. She is compliant with a low fat/cholesterol diet. She denies myalgias.   Diabetes: she follows with endocrine.  She is taking her medication daily as prescribed. She is compliant with a diabetic diet.    Hypothyroidism:  She is following with Endocrine.   She is taking her medication daily.  Her dose was recently adjusted by endocrine.     Medications and allergies reviewed with patient and updated if appropriate.  Patient Active Problem List   Diagnosis Date Noted  . Nausea 10/04/2018  . Upper abdominal pain 10/04/2018  . Medication side effects, initial encounter 02/01/2018  . Atrial fibrillation ([place]) 02/01/2018  . Leg cramps 07/05/2017  . Constipation 01/25/2017  . Closed fracture of proximal end of right humerus 07/09/2016  . Cough 06/17/2016  . AP  (abdominal pain) 03/10/2016  . Hearing loss 01/03/2016  . Allergic rhinitis 01/03/2016  . Minimal Coronary Plaque by Cardiac Cath in 2013 09/11/2015  . Palpitations 09/06/2015  . Frequent loose stools 08/28/2015  . Hyponatremia   . Acute mesenteric ischemia ([place])   . Hypothyroidism 07/11/2012  . Syncope 03/18/2012  . Abnormal LFTs   . Hypercholesteremia   . Hypertension   . Diabetes mellitus type 1 ([place])   . Osteoarthritis   . Thyroid nodule   . Osteoporosis, post-menopausal   . Primary cancer of upper outer quadrant of left female breast ([place]) 08/21/2011    Current Outpatient Medications on File Prior to Visit  Medication Sig Dispense Refill  . ALPRAZolam (XANAX) 0.5 MG tablet Take 1/2 tablet twice daily as needed for anxiety or sleep. 30 tablet 0  . aspirin EC 81 MG tablet Take 81 mg by mouth daily.    . B Complex-C (SUPER B COMPLEX PO) Take 1 tablet by mouth daily.    . BD VEO INSULIN SYRINGE U/F 31G X 15/64" 0.3 ML MISC USE WITH INJECTIONS 100 each 1  . bisacodyl (DULCOLAX) 10 MG suppository Place 1 suppository (10 mg total) rectally as needed for moderate constipation. 12 suppository 0  . carboxymethylcellulose (REFRESH PLUS) 0.5 % SOLN Place 1 drop into both eyes 3 (three) times daily as needed. Non-preservative    . cholecalciferol (VITAMIN D) 1000 UNITS tablet Take 5,000 Units by mouth daily.     [name]  fish oil-omega-3 fatty acids 1000 MG capsule Take 2 g by mouth daily.    . hydrochlorothiazide (HYDRODIURIL) 25 MG tablet Take 1 tablet (25 mg total) by mouth daily as needed (Swelling). 90 tablet 0  . hydrochlorothiazide (MICROZIDE) 12.5 MG capsule Take 12.5 mg by mouth every other day.     . Insulin Glargine (LANTUS SOLOSTAR) 100 UNIT/ML Solostar Pen Inject 14 Units into the skin daily at 10 pm.     . insulin lispro (HUMALOG KWIKPEN) 100 UNIT/ML KiwkPen Inject into the skin. Sliding scale before meal    . losartan (COZAAR) 100 MG tablet Take 1 tablet (100 mg total) by mouth  daily. 90 tablet 0  . metoprolol succinate (TOPROL XL) 25 MG 24 hr tablet Take 0.5 tablets (12.5 mg total) by mouth as needed. AS NEEDED FOR PALPITATIONS 30 tablet 11  . Multiple Vitamin (MULTIVITAMIN) tablet Take 1 tablet by mouth daily. For breast and bone health    . ondansetron (ZOFRAN) 4 MG tablet Take 1 tablet (4 mg total) by mouth every 8 (eight) hours as needed for nausea or vomiting. 20 tablet 0  . pantoprazole (PROTONIX) 40 MG tablet Take 1 tablet (40 mg total) by mouth daily. 30 tablet 5  . simvastatin (ZOCOR) 20 MG tablet Take 1 tablet (20 mg total) by mouth at bedtime. 90 tablet 1  . SYNTHROID 100 MCG tablet Take 1 tablet (100 mcg total) by mouth daily before breakfast.     No current facility-administered medications on file prior to visit.     Past Medical History:  Diagnosis Date  . Arthritis    fingers  . Cancer of upper-outer quadrant of female breast ([place]) 08/21/2011   ER +  PR +  Her 2 -  Ki67 9%   0.9 cm invasive lobular  Carcinoma ,s/p central lumpectomy with sentinel node biopsy,  ER/PR positive s/p re-excion on 09/16/11 with final pathology showing atypical hyperplasia   . CAP (community acquired pneumonia) 12/06/2016  . Diabetes mellitus    type wears insulin pump  . Diabetes mellitus type 1 (HCC)    type I- wears insulin pump  . GERD (gastroesophageal reflux disease)   . Hypercholesteremia   . Hypertension   . Hyponatremia   . Hypothyroid   . Hypothyroidism   . Osteoarthritis   . Osteoporosis, post-menopausal    DEXA 12/02/11: -3.5 (with lomax gyn), declines bisphos due to bone pain  . PAF (paroxysmal atrial fibrillation) ([place])    One episode in 2008, spontaneously converted in the ED, no further treatment  . Spondylosis    thoracic spine  . Thyroid nodule dx 07/2011   [place] q 35moto follow calcified L thyroid nodule (12/08/11 [place]    Past Surgical History:  Procedure Laterality Date  . ABDOMINAL HYSTERECTOMY    . ABDOMINAL HYSTERECTOMY  yrs ago  .  APPENDECTOMY    . APPENDECTOMY  age 85 . BLADDER REPAIR  5 yrs ago  . BREAST LUMPECTOMY    . BREAST SURGERY     biopsy  . BREAST SURGERY   yrs ago   br bx  . BREAST SURGERY  01/ 23/2013   left central lumpectomy and snbx, re-excision lumpectomy  . CARDIOVASCULAR STRESS TEST  03/17/2012  . LEFT HEART CATHETERIZATION WITH CORONARY ANGIOGRAM N/A 03/18/2012   Procedure: LEFT HEART CATHETERIZATION WITH CORONARY ANGIOGRAM;  Surgeon: JMinus Breeding MD;  [facility];  Service: Cardiovascular;  Laterality: N/A;  . TONSILLECTOMY   age 85  Social History   Socioeconomic History  . Marital status: Married    Spouse name: Not on file  . Number of children: Not on file  . Years of education: Not on file  . Highest education level: Not on file  Occupational History  . Not on file  Social Needs  . Financial resource strain: Not on file  . Food insecurity    Worry: Not on file    Inability: Not on file  . Transportation needs    Medical: Not on file    Non-medical: Not on file  Tobacco Use  . Smoking status: Former Smoker    Packs/day: 1.00    Years: 10.00    Pack years: 10.00    Types: Cigarettes    Quit date: 08/27/1977    Years since quitting: 41.6  . Smokeless tobacco: Never Used  Substance and Sexual Activity  . Alcohol use: Yes    Comment: rare  . Drug use: No  . Sexual activity: Yes    Birth control/protection: Post-menopausal  Lifestyle  . Physical activity    Days per week: Not on file    Minutes per session: Not on file  . Stress: Not on file  Relationships  . Social Herbalist on phone: Not on file    Gets together: Not on file    Attends religious service: Not on file    Active member of club or organization: Not on file    Attends meetings of clubs or organizations: Not on file    Relationship status: Not on file  Other Topics Concern  . Not on file  Social History Narrative   ** Merged History Encounter **        Family History   Problem Relation Age of Onset  . Cancer Sister        unknown  . Hypertension Mother   . Stroke Mother   . Arthritis Father   . Heart disease Father   . Diabetes Son     Review of Systems  Constitutional: Negative for chills and fever.  Respiratory: Positive for shortness of breath (had Afib three nights ago). Negative for cough and wheezing.   Cardiovascular: Positive for chest pain (tightness in chest - intermittent - feels it is related to epigastric symptoms) and palpitations (had Afib three nights ago). Negative for leg swelling.  Gastrointestinal: Positive for abdominal distention, abdominal pain, constipation and nausea. Negative for blood in stool and vomiting.       No gerd  Neurological: Positive for headaches. Negative for light-headedness.       Objective:   Vitals:   04/04/19 1425  BP: 124/72  Pulse: 81  Resp: 16  Temp: 98.7 F (37.1 C)  SpO2: 95%   BP Readings from Last 3 Encounters:  04/04/19 124/72  01/16/19 (!) 146/82  10/04/18 126/74   Wt Readings from Last 3 Encounters:  04/04/19 134 lb (60.8 kg)  01/16/19 135 lb (61.2 kg)  10/04/18 136 lb (61.7 kg)   Body mass index is 20.99 kg/m.   Physical Exam    Constitutional: Appears well-developed and well-nourished. No distress.  HENT:  Head: Normocephalic and atraumatic.  Neck: Neck supple. No tracheal deviation present. No thyromegaly present.  No cervical lymphadenopathy Cardiovascular: Normal rate, regular rhythm and normal heart sounds.   No murmur heard. No carotid bruit .  No edema Pulmonary/Chest: Effort normal and breath sounds normal. No respiratory distress. No has no wheezes. No rales.  Abdomen:  Soft, mild distention.  Tenderness fairly diffuse in abdomen with palpation.  No rebound or guarding.  No mass. Skin: Skin is warm and dry. Not diaphoretic.  Psychiatric: Normal mood and affect. Behavior is normal.      Assessment & Plan:    See Problem List for Assessment and Plan of  chronic medical problems.

## 2019-04-04 ENCOUNTER — Ambulatory Visit (INDEPENDENT_AMBULATORY_CARE_PROVIDER_SITE_OTHER)
Admission: RE | Admit: 2019-04-04 | Discharge: 2019-04-04 | Disposition: A | Discharge status: Home / Self Care | Payer: Medicare Other | Source: Ambulatory Visit | Attending: Internal Medicine | Admitting: Internal Medicine

## 2019-04-04 ENCOUNTER — Other Ambulatory Visit: Payer: Self-pay

## 2019-04-04 ENCOUNTER — Ambulatory Visit (INDEPENDENT_AMBULATORY_CARE_PROVIDER_SITE_OTHER): Payer: Medicare Other | Admitting: Internal Medicine

## 2019-04-04 ENCOUNTER — Encounter: Payer: Self-pay | Admitting: Internal Medicine

## 2019-04-04 ENCOUNTER — Other Ambulatory Visit: Payer: Self-pay | Admitting: Internal Medicine

## 2019-04-04 VITALS — BP 124/72 | HR 81 | Temp 98.7°F | Resp 16 | Ht 67.0 in | Wt 134.0 lb

## 2019-04-04 DIAGNOSIS — R109 Unspecified abdominal pain: Secondary | ICD-10-CM | POA: Diagnosis not present

## 2019-04-04 DIAGNOSIS — K5900 Constipation, unspecified: Secondary | ICD-10-CM | POA: Diagnosis not present

## 2019-04-04 DIAGNOSIS — E039 Hypothyroidism, unspecified: Secondary | ICD-10-CM | POA: Diagnosis not present

## 2019-04-04 DIAGNOSIS — I480 Paroxysmal atrial fibrillation: Secondary | ICD-10-CM

## 2019-04-04 DIAGNOSIS — E1043 Type 1 diabetes mellitus with diabetic autonomic (poly)neuropathy: Secondary | ICD-10-CM | POA: Diagnosis not present

## 2019-04-04 DIAGNOSIS — I10 Essential (primary) hypertension: Secondary | ICD-10-CM

## 2019-04-04 MED ORDER — SACCHAROMYCES BOULARDII 250 MG PO CAPS: 250.0000 mg | ORAL_CAPSULE | Freq: Two times a day (BID) | ORAL | 5 refills | Status: DC

## 2019-04-04 MED ORDER — LINACLOTIDE 72 MCG PO CAPS: 72.0000 ug | ORAL_CAPSULE | Freq: Every day | ORAL | Status: DC

## 2019-04-04 MED ORDER — IBGARD 90 MG PO CPCR: 1.0000 | ORAL_CAPSULE | Freq: Every day | ORAL | Status: DC

## 2019-04-04 NOTE — Assessment & Plan Note (Signed)
Her abdominal pain is likely caused by severe constipation Abdominal x-ray today-depending on results may need CT scan, but I do not think that will be necessary Continue Metamucil daily Start MiraLAX daily.  If MiraLAX does not help can try Linzess-I did give her samples but advised her not to take for now We will start probiotic Consider referral to GI depending on response in the next couple of days

## 2019-04-04 NOTE — Assessment & Plan Note (Signed)
She is severe, chronic constipation She has been taking Metamucil daily and that has worked for her well, but does not seem to be working as well lately Her abdominal pain is likely caused by severe constipation Abdominal x-ray today-depending on results may need CT scan, but I do not think that will be necessary Continue Metamucil daily Start MiraLAX daily.  If MiraLAX does not help can try Linzess-I did give her samples but advised her not to take for now We will start probiotic Consider referral to GI depending on response in the next couple of days

## 2019-04-04 NOTE — Assessment & Plan Note (Signed)
Following with cardiology-has an upcoming appointment Taking aspirin 81 mg daily Taking metoprolol as needed She did have a recent episode of atrial fibrillation that lasted a few hours-resolved on its own-she will discuss with cardiology

## 2019-04-04 NOTE — Patient Instructions (Addendum)
Have an x-ray done today.      Medications reviewed and updated.  Changes include :   Start a probiotic - I sent this to your pharmacy.    Try the linzess ( prescription medication for constipation) - this is a medication for constipation.  Take one pill daily.   Call with an update in your symptoms.   You can try IBgard as well like we discussed. This is over the counter.   Your prescription(s) have been submitted to your pharmacy. Please take as directed and contact our office if you believe you are having problem(s) with the medication(s).    Please followup in 6 months

## 2019-04-04 NOTE — Assessment & Plan Note (Addendum)
Following with endocrine - management per him Medication dose recently adjusted

## 2019-04-04 NOTE — Assessment & Plan Note (Signed)
BP well controlled Current regimen effective and well tolerated Continue current medications at current doses  

## 2019-04-04 NOTE — Assessment & Plan Note (Signed)
Management per Endocrine

## 2019-04-06 ENCOUNTER — Other Ambulatory Visit: Payer: Self-pay

## 2019-04-06 ENCOUNTER — Ambulatory Visit (INDEPENDENT_AMBULATORY_CARE_PROVIDER_SITE_OTHER): Payer: Medicare Other | Admitting: Internal Medicine

## 2019-04-06 ENCOUNTER — Encounter: Payer: Self-pay | Admitting: Internal Medicine

## 2019-04-06 VITALS — BP 162/74 | HR 84 | Ht 67.0 in | Wt 135.4 lb

## 2019-04-06 DIAGNOSIS — I480 Paroxysmal atrial fibrillation: Secondary | ICD-10-CM | POA: Diagnosis not present

## 2019-04-06 DIAGNOSIS — R140 Abdominal distension (gaseous): Secondary | ICD-10-CM

## 2019-04-06 DIAGNOSIS — I10 Essential (primary) hypertension: Secondary | ICD-10-CM

## 2019-04-06 DIAGNOSIS — R0989 Other specified symptoms and signs involving the circulatory and respiratory systems: Secondary | ICD-10-CM

## 2019-04-06 NOTE — Progress Notes (Signed)
HPI [name] returns today for followup. She notes an episode of atrial fib a few days ago which stopped spontaneously. She unfortunately is unwilling/unable to take systemic anti-coagulation due to bleeding. She notes some pain in her neck. She is worried about carotid stenosis. Allergies  Allergen Reactions  . Eliquis [Apixaban] Other (See Comments)    Dizziness, severe headach  . Amlodipine     Dizzy, nausea  . Augmentin [Amoxicillin-Pot Clavulanate] Other (See Comments)    Gi upset only no rash or hives   . Cortisone Other (See Comments)    Makes blood sugars run high  . Keflex [Cephalexin] Hives and Nausea Only    Stomach upset  . Omeprazole Nausea Only    Dizziness and nausea  . Prednisone     Other reaction(s): Other Makes blood sugars run high  . Hydrocodone Rash    Rash to chest , side back  . Latex Rash    Powder in gloves causes rash; "not allergic to latex just the powder"     Current Outpatient Medications  Medication Sig Dispense Refill  . ALPRAZolam (XANAX) 0.5 MG tablet Take 1/2 tablet twice daily as needed for anxiety or sleep. 30 tablet 0  . aspirin EC 81 MG tablet Take 81 mg by mouth daily.    . B Complex-C (SUPER B COMPLEX PO) Take 1 tablet by mouth daily.    . BD VEO INSULIN SYRINGE U/F 31G X 15/64" 0.3 ML MISC USE WITH INJECTIONS 100 each 1  . bisacodyl (DULCOLAX) 10 MG suppository Place 1 suppository (10 mg total) rectally as needed for moderate constipation. 12 suppository 0  . carboxymethylcellulose (REFRESH PLUS) 0.5 % SOLN Place 1 drop into both eyes 3 (three) times daily as needed. Non-preservative    . cholecalciferol (VITAMIN D) 1000 UNITS tablet Take 5,000 Units by mouth daily.     . fish oil-omega-3 fatty acids 1000 MG capsule Take 2 g by mouth daily.    . hydrochlorothiazide (HYDRODIURIL) 25 MG tablet Take 1 tablet (25 mg total) by mouth daily as needed (Swelling). 90 tablet 0  . hydrochlorothiazide (HYDRODIURIL) 25 MG tablet Take  12.5 mg by mouth daily.    . Insulin Glargine (LANTUS SOLOSTAR) 100 UNIT/ML Solostar Pen Inject 14 Units into the skin daily at 10 pm.     . insulin lispro (HUMALOG KWIKPEN) 100 UNIT/ML KiwkPen Inject into the skin. Sliding scale before meal    . linaclotide (LINZESS) 72 MCG capsule Take 1 capsule (72 mcg total) by mouth daily before breakfast. 30 capsule   . losartan (COZAAR) 100 MG tablet Take 1 tablet (100 mg total) by mouth daily. 90 tablet 0  . metoprolol succinate (TOPROL XL) 25 MG 24 hr tablet Take 0.5 tablets (12.5 mg total) by mouth as needed. AS NEEDED FOR PALPITATIONS 30 tablet 11  . Multiple Vitamin (MULTIVITAMIN) tablet Take 1 tablet by mouth daily. For breast and bone health    . ondansetron (ZOFRAN) 4 MG tablet Take 1 tablet (4 mg total) by mouth every 8 (eight) hours as needed for nausea or vomiting. 20 tablet 0  . pantoprazole (PROTONIX) 40 MG tablet Take 1 tablet (40 mg total) by mouth daily. 30 tablet 5  . Peppermint Oil (IBGARD) 90 MG CPCR Take 1 capsule by mouth daily.    [name] saccharomyces boulardii (FLORASTOR) 250 MG capsule Take 1 capsule (250 mg total) by mouth 2 (two) times daily. 60 capsule 5  . simvastatin (ZOCOR) 20 MG  tablet TAKE ONE TABLET AT BEDTIME. 90 tablet 1  . SYNTHROID 100 MCG tablet Take 1 tablet (100 mcg total) by mouth daily before breakfast.     No current facility-administered medications for this visit.      Past Medical History:  Diagnosis Date  . Arthritis    fingers  . Cancer of upper-outer quadrant of female breast ([place]) 08/21/2011   ER +  PR +  Her 2 -  Ki67 9%   0.9 cm invasive lobular  Carcinoma ,s/p central lumpectomy with sentinel node biopsy,  ER/PR positive s/p re-excion on 09/16/11 with final pathology showing atypical hyperplasia   . CAP (community acquired pneumonia) 12/06/2016  . Diabetes mellitus    type wears insulin pump  . Diabetes mellitus type 1 (HCC)    type I- wears insulin pump  . GERD (gastroesophageal reflux disease)   .  Hypercholesteremia   . Hypertension   . Hyponatremia   . Hypothyroid   . Hypothyroidism   . Osteoarthritis   . Osteoporosis, post-menopausal    DEXA 12/02/11: -3.5 (with lomax gyn), declines bisphos due to bone pain  . PAF (paroxysmal atrial fibrillation) ([place])    One episode in 2008, spontaneously converted in the ED, no further treatment  . Spondylosis    thoracic spine  . Thyroid nodule dx 07/2011   [place] q 13moto follow calcified L thyroid nodule (12/08/11 [place]    ROS:   All systems reviewed and negative except as noted in the HPI.   Past Surgical History:  Procedure Laterality Date  . ABDOMINAL HYSTERECTOMY    . ABDOMINAL HYSTERECTOMY  yrs ago  . APPENDECTOMY    . APPENDECTOMY  age 85 . BLADDER REPAIR  5 yrs ago  . BREAST LUMPECTOMY    . BREAST SURGERY     biopsy  . BREAST SURGERY   yrs ago   br bx  . BREAST SURGERY  01/ 23/2013   left central lumpectomy and snbx, re-excision lumpectomy  . CARDIOVASCULAR STRESS TEST  03/17/2012  . LEFT HEART CATHETERIZATION WITH CORONARY ANGIOGRAM N/A 03/18/2012   Procedure: LEFT HEART CATHETERIZATION WITH CORONARY ANGIOGRAM;  Surgeon: JMinus Breeding MD;  [facility];  Service: Cardiovascular;  Laterality: N/A;  . TONSILLECTOMY   age 85    Family History  Problem Relation Age of Onset  . Cancer Sister        unknown  . Hypertension Mother   . Stroke Mother   . Arthritis Father   . Heart disease Father   . Diabetes Son      Social History   Socioeconomic History  . Marital status: Married    Spouse name: Not on file  . Number of children: Not on file  . Years of education: Not on file  . Highest education level: Not on file  Occupational History  . Not on file  Social Needs  . Financial resource strain: Not on file  . Food insecurity    Worry: Not on file    Inability: Not on file  . Transportation needs    Medical: Not on file    Non-medical: Not on file  Tobacco Use  . Smoking status: Former Smoker     Packs/day: 1.00    Years: 10.00    Pack years: 10.00    Types: Cigarettes    Quit date: 08/27/1977    Years since quitting: 41.6  . Smokeless tobacco: Never Used  Substance and Sexual Activity  .  Alcohol use: Yes    Comment: rare  . Drug use: No  . Sexual activity: Yes    Birth control/protection: Post-menopausal  Lifestyle  . Physical activity    Days per week: Not on file    Minutes per session: Not on file  . Stress: Not on file  Relationships  . Social Herbalist on phone: Not on file    Gets together: Not on file    Attends religious service: Not on file    Active member of club or organization: Not on file    Attends meetings of clubs or organizations: Not on file    Relationship status: Not on file  . Intimate partner violence    Fear of current or ex partner: Not on file    Emotionally abused: Not on file    Physically abused: Not on file    Forced sexual activity: Not on file  Other Topics Concern  . Not on file  Social History Narrative   ** Merged History Encounter **         BP (!) 162/74   Pulse 84   Ht '5\' 7"'$  (1.702 m)   Wt 135 lb 6.4 oz (61.4 kg)   SpO2 96%   BMI 21.21 kg/m   Physical Exam:  Well appearing elderly woman, NAD HEENT: Unremarkable Neck:  6 cm JVD, no thyromegally, soft left carotid bruit Lymphatics:  No adenopathy Back:  No CVA tenderness Lungs:  Clear with no wheezes HEART:  Regular rate rhythm, no murmurs, no rubs, no clicks Abd:  soft, positive bowel sounds, no organomegally, no rebound, no guarding Ext:  2 plus pulses, no edema, no cyanosis, no clubbing Skin:  No rashes no nodules Neuro:  CN II through XII intact, motor grossly intact  ECG - NSR Assess/Plan: 1. PAF - she is back in NSR. She is on no anti-coagulation due to bleeding and her uninterest in trying additional blood thinners. 2. HTN - her blood pressure is elevated but she notes that when she is at home her blood pressure is closer to normal. I  encouraged her to maintain a low sodium diet. 3. Carotid bruit - we will recheck a carotid echo.  Mikle Bosworth.D.

## 2019-04-06 NOTE — Patient Instructions (Addendum)
Medication Instructions:  Your physician recommends that you continue on your current medications as directed. Please refer to the Current Medication list given to you today.  Labwork: None ordered.  Testing/Procedures: Your physician has requested that you have a carotid duplex. This test is an ultrasound of the carotid arteries in your neck. It looks at blood flow through these arteries that supply the brain with blood. Allow one hour for this exam. There are no restrictions or special instructions.  Please schedule for carotid ultrasound  Follow-Up: Your physician wants you to follow-up in: one year with Dr. Lovena Le.   You will receive a reminder letter in the mail two months in advance. If you don't receive a letter, please call our office to schedule the follow-up appointment.  Any Other Special Instructions Will Be Listed Below (If Applicable).  If you need a refill on your cardiac medications before your next appointment, please call your pharmacy.

## 2019-04-07 ENCOUNTER — Other Ambulatory Visit: Payer: Self-pay | Admitting: Internal Medicine

## 2019-04-07 DIAGNOSIS — R140 Abdominal distension (gaseous): Secondary | ICD-10-CM

## 2019-04-07 DIAGNOSIS — R1084 Generalized abdominal pain: Secondary | ICD-10-CM

## 2019-04-10 ENCOUNTER — Ambulatory Visit (HOSPITAL_COMMUNITY)
Admission: RE | Admit: 2019-04-10 | Discharge: 2019-04-10 | Disposition: A | Discharge status: Home / Self Care | Payer: Medicare Other | Source: Ambulatory Visit | Attending: Internal Medicine | Admitting: Internal Medicine

## 2019-04-10 ENCOUNTER — Other Ambulatory Visit: Payer: Self-pay

## 2019-04-10 ENCOUNTER — Other Ambulatory Visit: Payer: Self-pay | Admitting: Internal Medicine

## 2019-04-10 DIAGNOSIS — R140 Abdominal distension (gaseous): Secondary | ICD-10-CM | POA: Insufficient documentation

## 2019-04-10 DIAGNOSIS — I700 Atherosclerosis of aorta: Secondary | ICD-10-CM | POA: Insufficient documentation

## 2019-04-10 DIAGNOSIS — R1084 Generalized abdominal pain: Secondary | ICD-10-CM | POA: Diagnosis not present

## 2019-04-10 DIAGNOSIS — R933 Abnormal findings on diagnostic imaging of other parts of digestive tract: Secondary | ICD-10-CM | POA: Insufficient documentation

## 2019-04-10 LAB — POCT I-STAT CREATININE: Creatinine, Ser: 0.7 mg/dL (ref 0.44–1.00)

## 2019-04-10 MED ORDER — SODIUM CHLORIDE (PF) 0.9 % IJ SOLN
INTRAMUSCULAR | Status: AC
  Filled 2019-04-10: qty 50

## 2019-04-10 MED ORDER — IOHEXOL 300 MG/ML  SOLN
100.0000 mL | Freq: Once | INTRAMUSCULAR | Status: AC | PRN
  Administered 2019-04-10: 100 mL via INTRAVENOUS

## 2019-04-10 NOTE — Telephone Encounter (Signed)
Ct shows a lot of stool in the colon and small bowel indicating constipation.  She has possible infectious or inflammatory colitis - we need to start antibotics - will try flagyl and cipro. (Pending)  If there is no improvement over the next few days have her call.

## 2019-04-11 ENCOUNTER — Other Ambulatory Visit: Payer: Self-pay | Admitting: Internal Medicine

## 2019-04-11 ENCOUNTER — Encounter: Payer: Self-pay | Admitting: Internal Medicine

## 2019-04-11 MED ORDER — METRONIDAZOLE 500 MG PO TABS: 500.0000 mg | ORAL_TABLET | Freq: Three times a day (TID) | ORAL | 0 refills | Status: DC

## 2019-04-11 MED ORDER — CIPROFLOXACIN HCL 500 MG PO TABS: 500.0000 mg | ORAL_TABLET | Freq: Two times a day (BID) | ORAL | 0 refills | Status: DC

## 2019-04-12 ENCOUNTER — Other Ambulatory Visit: Payer: Self-pay

## 2019-04-12 ENCOUNTER — Encounter: Payer: Self-pay | Admitting: Internal Medicine

## 2019-04-12 ENCOUNTER — Ambulatory Visit (HOSPITAL_COMMUNITY)
Admission: RE | Admit: 2019-04-12 | Discharge: 2019-04-12 | Disposition: A | Discharge status: Home / Self Care | Payer: Medicare Other | Source: Ambulatory Visit | Attending: Cardiology | Admitting: Cardiology

## 2019-04-12 DIAGNOSIS — R0989 Other specified symptoms and signs involving the circulatory and respiratory systems: Secondary | ICD-10-CM | POA: Insufficient documentation

## 2019-04-12 DIAGNOSIS — R1084 Generalized abdominal pain: Secondary | ICD-10-CM

## 2019-04-13 ENCOUNTER — Telehealth: Payer: Self-pay | Admitting: Internal Medicine

## 2019-04-13 NOTE — Telephone Encounter (Signed)
Message sent via my chart

## 2019-04-13 NOTE — Telephone Encounter (Signed)
Patient is calling back for advice from Dr. Quay Burow. Regarding her stomach. She states if the Levaquin is related to the Cipro she does not want it. She is concerned why Dr. Quay Burow wants her to go to GI so soon. Please advise [phone]

## 2019-04-13 NOTE — Telephone Encounter (Signed)
Since she tolerates very few antibiotics and only a few will treat inflammation/infection in the colon GI would help [place] determine if she really needs to take antibiotics.  We could try Augmentin, but I do not think she has tolerated that in the past due to stomach upset

## 2019-04-14 ENCOUNTER — Other Ambulatory Visit (INDEPENDENT_AMBULATORY_CARE_PROVIDER_SITE_OTHER): Payer: Medicare Other

## 2019-04-14 DIAGNOSIS — R1084 Generalized abdominal pain: Secondary | ICD-10-CM | POA: Diagnosis not present

## 2019-04-14 LAB — AMYLASE: Amylase: 58 U/L (ref 27–131)

## 2019-04-14 LAB — CBC WITH DIFFERENTIAL/PLATELET
Basophils Absolute: 0.1 10*3/uL (ref 0.0–0.1)
Basophils Relative: 0.9 % (ref 0.0–3.0)
Eosinophils Absolute: 0.2 10*3/uL (ref 0.0–0.7)
Eosinophils Relative: 2.3 % (ref 0.0–5.0)
HCT: 37.7 % (ref 36.0–46.0)
Hemoglobin: 12.8 g/dL (ref 12.0–15.0)
Lymphocytes Relative: 30.4 % (ref 12.0–46.0)
Lymphs Abs: 2.3 10*3/uL (ref 0.7–4.0)
MCHC: 33.8 g/dL (ref 30.0–36.0)
MCV: 88.5 fl (ref 78.0–100.0)
Monocytes Absolute: 0.9 10*3/uL (ref 0.1–1.0)
Monocytes Relative: 11.9 % (ref 3.0–12.0)
Neutro Abs: 4.1 10*3/uL (ref 1.4–7.7)
Neutrophils Relative %: 54.5 % (ref 43.0–77.0)
Platelets: 225 10*3/uL (ref 150.0–400.0)
RBC: 4.26 Mil/uL (ref 3.87–5.11)
RDW: 12.9 % (ref 11.5–15.5)
WBC: 7.5 10*3/uL (ref 4.0–10.5)

## 2019-04-14 LAB — COMPREHENSIVE METABOLIC PANEL
ALT: 12 U/L (ref 0–35)
AST: 24 U/L (ref 0–37)
Albumin: 4.2 g/dL (ref 3.5–5.2)
Alkaline Phosphatase: 74 U/L (ref 39–117)
BUN: 8 mg/dL (ref 6–23)
CO2: 27 mEq/L (ref 19–32)
Calcium: 9.8 mg/dL (ref 8.4–10.5)
Chloride: 99 mEq/L (ref 96–112)
Creatinine, Ser: 0.72 mg/dL (ref 0.40–1.20)
GFR: 76.47 mL/min (ref >60.00)
Glucose, Bld: 92 mg/dL (ref 70–99)
Potassium: 3.7 mEq/L (ref 3.5–5.1)
Sodium: 133 mEq/L — ABNORMAL LOW (ref 135–145)
Total Bilirubin: 0.5 mg/dL (ref 0.2–1.2)
Total Protein: 7.6 g/dL (ref 6.0–8.3)

## 2019-04-14 LAB — LIPASE: Lipase: 12 U/L (ref 11.0–59.0)

## 2019-04-16 ENCOUNTER — Encounter: Payer: Self-pay | Admitting: Internal Medicine

## 2019-04-16 DIAGNOSIS — K529 Noninfective gastroenteritis and colitis, unspecified: Secondary | ICD-10-CM

## 2019-04-18 ENCOUNTER — Other Ambulatory Visit: Payer: Self-pay | Admitting: Internal Medicine

## 2019-04-19 ENCOUNTER — Encounter: Payer: Self-pay | Admitting: Gastroenterology

## 2019-04-27 ENCOUNTER — Other Ambulatory Visit: Payer: Self-pay | Admitting: Internal Medicine

## 2019-04-27 MED ORDER — ALPRAZOLAM 0.5 MG PO TABS: ORAL_TABLET | ORAL | 0 refills | Status: DC

## 2019-04-27 MED ORDER — BISACODYL 10 MG RE SUPP: 10.0000 mg | RECTAL | 0 refills | Status: AC | PRN

## 2019-05-04 ENCOUNTER — Encounter: Payer: Self-pay | Admitting: Gastroenterology

## 2019-05-04 ENCOUNTER — Other Ambulatory Visit: Payer: Self-pay

## 2019-05-04 ENCOUNTER — Ambulatory Visit: Payer: Medicare Other | Admitting: Gastroenterology

## 2019-05-04 VITALS — BP 157/78 | HR 85 | Temp 98.3°F | Ht 67.0 in | Wt 137.5 lb

## 2019-05-04 DIAGNOSIS — K589 Irritable bowel syndrome without diarrhea: Secondary | ICD-10-CM | POA: Diagnosis not present

## 2019-05-04 NOTE — Patient Instructions (Addendum)
If you are age 83 or older, your body mass index should be between 23-30. Your Body mass index is 21.54 kg/m. If this is out of the aforementioned range listed, please consider follow up with your Primary Care Provider.  If you are age 19 or younger, your body mass index should be between 19-25. Your Body mass index is 21.54 kg/m. If this is out of the aformentioned range listed, please consider follow up with your Primary Care Provider.   To help prevent the possible spread of infection to our patients, communities, and staff; we will be implementing the following measures:  As of now we are not allowing any visitors/family members to accompany you to any upcoming appointments with [facility]. If you have any concerns about this please contact our office to discuss prior to the appointment.   Please purchase the following medications over the counter and take as directed: Miralax 17g by mouth twice daily until you have a bowel movement and then take once daily.  Continue taking your Metamucil.  Begin a Gluten-Free diet.  Please call Dr. Steve Rattler nurse Faythe Casa, RN)  In 2 weeks at [phone]  to let her know how you are doing.  Thank you,  Dr. Jackquline Denmark

## 2019-05-04 NOTE — Progress Notes (Signed)
Chief Complaint: Constipation  Referring Provider:  Binnie Rail, MD      ASSESSMENT AND PLAN;   #1. Chronic constipation with type III dyssynergic defecation (unable to expel rectal balloon on anorectal manometry at [facility] 2018, being followed by Dr. Derrill Kay). Has assoc IBS. CT AP 04/10/2019 showing large amount of stool in the rectum and distal sigmoid, pelvic floor laxity, thickening of transverse/descending colon. No clinical or biochemical evidence of colitis.  #2.  Rectocele  Plan: - Records from our prev practice. - Miralax 17g po bid until good BM, then QD. - Would continue metamucil  - She would like to have trial of gluten free diet  - Call in 2 weeks - If still with problems, biofeedback at [facility] or [place] in 6 weeks.    HPI:    [name] is a 85 y.o. 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 Billey Gosling For chronic abdominal problems  With significant constipation.  Has to strain, then gets even worse.  Underwent CT scan Abdo/pelvis which showed questionable colitis with significant stool in the distal sigmoid colon and in the rectum.  She was given empiric Cipro and Flagyl which she did not take as she was concerned about the side effects.  No fever or chills.  No melena or hematochezia.  Had extensive GI work-up at [facility] including colonoscopy December 2016 which was negative.  Random colonic biopsies did show lymphocytic colitis.  EGD 07/2010 was unremarkable.  CT scan previously had shown hiatal hernia.  He denies having any significant abdominal pain but does complain of abdominal bloating.  Drinks plenty of water.  Denies having any upper GI symptoms including nausea, vomiting, heartburn, regurgitation, odynophagia or dysphagia.  No weight loss.  She has tried multiple medications for constipation.  Most recently Dulcolax suppositories which did not work.  Past GI  work-up: -Colonoscopy by Dr.Koch 07/2015-negative.  Random Bx did show lymphocytic colitis. -CT May 2016: Type III hiatal hernia.  Postsurgical changes of small bowel resection, adhesions without bowel obstruction. -EGD 07/2010 Dr Donetta Potts.  Past Medical History:  Diagnosis Date   Anxiety    Arthritis    fingers   Bowel obstruction ([place])    Cancer of upper-outer quadrant of female breast ([place]) 08/21/2011   ER +  PR +  Her 2 -  Ki67 9%   0.9 cm invasive lobular  Carcinoma ,s/p central lumpectomy with sentinel node biopsy,  ER/PR positive s/p re-excion on 09/16/11 with final pathology showing atypical hyperplasia    CAP (community acquired pneumonia) 12/06/2016   Diabetes mellitus type 1 ([place])    type I- wears insulin pump   GERD (gastroesophageal reflux disease)    History of small bowel obstruction    Hypercholesteremia    Hypergastrinemia    Hypertension    Hypertension    Hyponatremia    Hypothyroid    Hypothyroidism    IBS (irritable bowel syndrome)    Osteoarthritis    Osteoporosis, post-menopausal    DEXA 12/02/11: -3.5 (with lomax gyn), declines bisphos due to bone pain   PAF (paroxysmal atrial fibrillation) ([place])    One episode in 2008, spontaneously converted in the ED, no further treatment   Spondylosis    thoracic spine   Thyroid nodule dx 07/2011   [place] q 56moto follow calcified L thyroid nodule (42/59/56[place]   Umbilical hernia     Past  Surgical History:  Procedure Laterality Date   ABDOMINAL HYSTERECTOMY     ABDOMINAL HYSTERECTOMY  yrs ago   APPENDECTOMY     APPENDECTOMY  age 48   BLADDER REPAIR  5 yrs ago   BREAST LUMPECTOMY     BREAST SURGERY     biopsy   BREAST SURGERY   yrs ago   br bx   BREAST SURGERY  01/ 23/2013   left central lumpectomy and snbx, re-excision lumpectomy   CARDIOVASCULAR STRESS TEST  03/17/2012   colon surgery     Small intestines- took 6 inches out   COLONOSCOPY  07/11/2015   [facility] Dr Derrill Kay    ESOPHAGOGASTRODUODENOSCOPY  07/17/2010   [facility]   LEFT HEART CATHETERIZATION WITH CORONARY ANGIOGRAM N/A 03/18/2012   Procedure: LEFT HEART CATHETERIZATION WITH CORONARY ANGIOGRAM;  Surgeon: Minus Breeding, MD;  Location: [facility] CATH LAB;  Service: Cardiovascular;  Laterality: N/A;   TONSILLECTOMY   age 62    Family History  Problem Relation Age of Onset   Cancer Sister        unknown   Hypertension Mother    Stroke Mother    Arthritis Father    Heart disease Father    Diabetes Son    Colon cancer Neg Hx    Esophageal cancer Neg Hx     Social History   Tobacco Use   Smoking status: Former Smoker    Packs/day: 1.00    Years: 10.00    Pack years: 10.00    Types: Cigarettes    Quit date: 08/27/1977    Years since quitting: 41.7   Smokeless tobacco: Never Used  Substance Use Topics   Alcohol use: Yes    Comment: rare   Drug use: No    Current Outpatient Medications  Medication Sig Dispense Refill   ALPRAZolam (XANAX) 0.5 MG tablet Take 1/2 tablet twice daily as needed for anxiety or sleep. 30 tablet 0   aspirin EC 81 MG tablet Take 81 mg by mouth daily.     B Complex-C (SUPER B COMPLEX PO) Take 1 tablet by mouth daily.     BD VEO INSULIN SYRINGE U/F 31G X 15/64" 0.3 ML MISC USE WITH INJECTIONS 100 each 0   bisacodyl (DULCOLAX) 10 MG suppository Place 1 suppository (10 mg total) rectally as needed for moderate constipation. 12 suppository 0   carboxymethylcellulose (REFRESH PLUS) 0.5 % SOLN Place 1 drop into both eyes 3 (three) times daily as needed. Non-preservative     cholecalciferol (VITAMIN D) 1000 UNITS tablet Take 5,000 Units by mouth daily.      fish oil-omega-3 fatty acids 1000 MG capsule Take 2 g by mouth daily.     hydrochlorothiazide (HYDRODIURIL) 25 MG tablet Take 1 tablet (25 mg total) by mouth daily as needed (Swelling). 90 tablet 0   hydrochlorothiazide (HYDRODIURIL) 25 MG tablet Take 12.5 mg by mouth daily.     Insulin Glargine  (LANTUS SOLOSTAR) 100 UNIT/ML Solostar Pen Inject 14 Units into the skin daily at 10 pm.      insulin lispro (HUMALOG KWIKPEN) 100 UNIT/ML KiwkPen Inject into the skin. Sliding scale before meal     linaclotide (LINZESS) 72 MCG capsule Take 1 capsule (72 mcg total) by mouth daily before breakfast. 30 capsule    losartan (COZAAR) 100 MG tablet Take 1 tablet (100 mg total) by mouth daily. 90 tablet 0   metoprolol succinate (TOPROL XL) 25 MG 24 hr tablet Take 0.5 tablets (12.5 mg total)  by mouth as needed. AS NEEDED FOR PALPITATIONS 30 tablet 11   Multiple Vitamin (MULTIVITAMIN) tablet Take 1 tablet by mouth daily. For breast and bone health     ondansetron (ZOFRAN) 4 MG tablet Take 1 tablet (4 mg total) by mouth every 8 (eight) hours as needed for nausea or vomiting. 20 tablet 0   pantoprazole (PROTONIX) 40 MG tablet Take 1 tablet (40 mg total) by mouth daily. 30 tablet 5   Peppermint Oil (IBGARD) 90 MG CPCR Take 1 capsule by mouth daily.     saccharomyces boulardii (FLORASTOR) 250 MG capsule Take 1 capsule (250 mg total) by mouth 2 (two) times daily. 60 capsule 5   simvastatin (ZOCOR) 20 MG tablet TAKE ONE TABLET AT BEDTIME. 90 tablet 1   SYNTHROID 100 MCG tablet Take 1 tablet (100 mcg total) by mouth daily before breakfast.     No current facility-administered medications for this visit.     Allergies  Allergen Reactions   Eliquis [Apixaban] Other (See Comments)    Dizziness, severe headach   Amlodipine     Dizzy, nausea   Augmentin [Amoxicillin-Pot Clavulanate] Other (See Comments)    Gi upset only no rash or hives    Cortisone Other (See Comments)    Makes blood sugars run high   Keflex [Cephalexin] Hives and Nausea Only    Stomach upset   Omeprazole Nausea Only    Dizziness and nausea   Prednisone     Other reaction(s): Other Makes blood sugars run high   Hydrocodone Rash    Rash to chest , side back   Latex Rash    Powder in gloves causes rash; "not  allergic to latex just the powder"    Review of Systems:  Constitutional: Denies fever, chills, diaphoresis, appetite change and fatigue.  HEENT: Denies photophobia, eye pain, redness, hearing loss, ear pain, congestion, sore throat, rhinorrhea, sneezing, mouth sores, neck pain, neck stiffness and tinnitus.   Respiratory: Denies SOB, DOE, cough, chest tightness,  and wheezing.   Cardiovascular: Denies chest pain, palpitations and leg swelling.  Genitourinary: Denies dysuria, urgency, frequency, hematuria, flank pain and difficulty urinating.  Musculoskeletal: Denies myalgias, back pain, joint swelling, arthralgias and gait problem.  Skin: No rash.  Neurological: Denies dizziness, seizures, syncope, weakness, light-headedness, numbness and headaches.  Hematological: Denies adenopathy. Easy bruising, personal or family bleeding history  Psychiatric/Behavioral: No anxiety or depression     Physical Exam:    BP (!) 157/78 Comment: verbalized   Pulse 85    Temp 98.3 F (36.8 C)    Ht '5\' 7"'$  (1.702 m)    Wt 137 lb 8 oz (62.4 kg)    BMI 21.54 kg/m  Filed Weights   05/04/19 0956  Weight: 137 lb 8 oz (62.4 kg)   Constitutional:  Well-developed, in no acute distress. Psychiatric: Normal mood and affect. Behavior is normal. HEENT: Pupils normal.  Conjunctivae are normal. No scleral icterus. Neck supple.  Cardiovascular: Normal rate, regular rhythm. No edema Pulmonary/chest: Effort normal and breath sounds normal. No wheezing, rales or rhonchi. Abdominal: Soft, nondistended. Nontender. Bowel sounds active throughout. There are no masses palpable. No hepatomegaly. Rectal: Mild rectal stenosis, suggestion of rectocele, brown heme-negative stools.  Rectal vault appears to be empty. Seen in presence of Herma Mering CMA. Neurological: Alert and oriented to person place and time. Skin: Skin is warm and dry. No rashes noted.  Data Reviewed: I have personally reviewed following labs and imaging  studies  CBC: CBC Latest  Ref Rng & Units 04/14/2019 01/25/2019 10/04/2018  WBC 4.0 - 10.5 K/uL 7.5 8.1 8.0  Hemoglobin 12.0 - 15.0 g/dL 12.8 13.0 13.1  Hematocrit 36.0 - 46.0 % 37.7 37.8 38.3  Platelets 150.0 - 400.0 K/uL 225.0 219.0 278.0    CMP: CMP Latest Ref Rng & Units 04/14/2019 04/10/2019 01/25/2019  Glucose 70 - 99 mg/dL 92 - 122(H)  BUN 6 - 23 mg/dL 8 - 15  Creatinine 0.40 - 1.20 mg/dL 0.72 0.70 0.70  Sodium 135 - 145 mEq/L 133(L) - 131(L)  Potassium 3.5 - 5.1 mEq/L 3.7 - 4.0  Chloride 96 - 112 mEq/L 99 - 98  CO2 19 - 32 mEq/L 27 - 26  Calcium 8.4 - 10.5 mg/dL 9.8 - 10.0  Total Protein 6.0 - 8.3 g/dL 7.6 - 7.5  Total Bilirubin 0.2 - 1.2 mg/dL 0.5 - 0.4  Alkaline Phos 39 - 117 U/L 74 - 79  AST 0 - 37 U/L 24 - 24  ALT 0 - 35 U/L 12 - 15      Radiology Studies: Ct Abdomen Pelvis W Contrast  Result Date: 04/10/2019 CLINICAL DATA:  Abdominal distention and generalized abdominal pain. EXAM: CT ABDOMEN AND PELVIS WITH CONTRAST TECHNIQUE: Multidetector CT imaging of the abdomen and pelvis was performed using the standard protocol following bolus administration of intravenous contrast. CONTRAST:  146m OMNIPAQUE IOHEXOL 300 MG/ML  SOLN COMPARISON:  March 11, 2016. FINDINGS: Lower chest: The lung bases are clear. The heart size is normal. Hepatobiliary: The liver is normal. Normal gallbladder.There is no biliary ductal dilation. Pancreas: Normal contours without ductal dilatation. No peripancreatic fluid collection. Spleen: No splenic laceration or hematoma. Adrenals/Urinary Tract: --Adrenal glands: No adrenal hemorrhage. --Right kidney/ureter: No hydronephrosis or perinephric hematoma. --Left kidney/ureter: No hydronephrosis or perinephric hematoma. --Urinary bladder: Unremarkable. Stomach/Bowel: --Stomach/Duodenum: There is a small hiatal hernia. There may be some mild wall thickening of the gastric antrum/pylorus. --Small bowel: There is fecalization of the distal small bowel. There is no  evidence for small bowel obstruction. --Colon: There is a large amount of stool in the rectum importance of the sigmoid colon. There is diffuse wall thickening of the transverse colon and descending colon. There is probable pelvic 4 laxity with evidence for the potential rectocele. --Appendix: Not visualized. No right lower quadrant inflammation or free fluid. Vascular/Lymphatic: Atherosclerotic calcification is present within the non-aneurysmal abdominal aorta, without hemodynamically significant stenosis. --No retroperitoneal lymphadenopathy. --No mesenteric lymphadenopathy. --No pelvic or inguinal lymphadenopathy. Reproductive: Status post hysterectomy. No adnexal mass. Other: No ascites or free air. The abdominal wall is normal. Musculoskeletal. No acute displaced fractures. IMPRESSION: 1. Findings suspicious for infectious or inflammatory colitis involving the transverse and descending colon. There is a large amount of stool in the rectum and portions of the sigmoid colon. 2. Fecalization of the distal small bowel consistent with slow transit. 3. Apparent pelvic floor laxity with a probable rectocele. 4.  Aortic Atherosclerosis (ICD10-I70.0). Electronically Signed   By: CConstance HolsterM.D.   On: 04/10/2019 17:36   Dg Abd 2 Views  Result Date: 04/05/2019 CLINICAL DATA:  Abdominal pain constipation EXAM: ABDOMEN - 2 VIEW COMPARISON:  01/25/2017 FINDINGS: Normal bowel gas pattern. No obstruction or ileus. Mild amount of stool in the right colon. No free air. Atherosclerotic aorta. Mild dextroscoliosis. No acute skeletal abnormality. IMPRESSION: Normal bowel gas pattern.  Mild stool in the right colon. Electronically Signed   By: CFranchot GalloM.D.   On: 04/05/2019 08:46   Vas [place]  Result  Date: 04/13/2019 Carotid Arterial Duplex Study Indications:  Bilateral bruits, Carotid artery disease and Patient denies any               cerebrovascular symptoms. Risk Factors: Hypertension, hyperlipidemia,  Diabetes, past history of smoking. Performing Technologist: Wilkie Aye RVT  Examination Guidelines: A complete evaluation includes B-mode imaging, spectral Doppler, color Doppler, and power Doppler as needed of all accessible portions of each vessel. Bilateral testing is considered an integral part of a complete examination. Limited examinations for reoccurring indications may be performed as noted.  Right Carotid Findings: +----------+--------+--------+--------+--------------------------+---------+             PSV cm/s EDV cm/s Stenosis Plaque Description         Comments   +----------+--------+--------+--------+--------------------------+---------+  CCA Prox   66       7                                                       +----------+--------+--------+--------+--------------------------+---------+  CCA Mid    66       9                                                       +----------+--------+--------+--------+--------------------------+---------+  CCA Distal 50       11       <50%     heterogenous                          +----------+--------+--------+--------+--------------------------+---------+  ICA Prox   57       14       1-39%    heterogenous and irregular Shadowing  +----------+--------+--------+--------+--------------------------+---------+  ICA Mid    111      26                                                      +----------+--------+--------+--------+--------------------------+---------+  ICA Distal 58       14                                                      +----------+--------+--------+--------+--------------------------+---------+  ECA        92       5                 hypoechoic and irregular              +----------+--------+--------+--------+--------------------------+---------+ +----------+--------+-------+---------+-------------------+             PSV cm/s EDV cms Describe  Arm Pressure (mmHG)  +----------+--------+-------+---------+-------------------+  Subclavian 171               Turbulent 140                  +----------+--------+-------+---------+-------------------+ +---------+--------+--------+------+  Vertebral PSV cm/s EDV cm/s Absent  +---------+--------+--------+------+ Left Carotid  Findings: +----------+--------+--------+--------+--------------------------+---------+             PSV cm/s EDV cm/s Stenosis Plaque Description         Comments   +----------+--------+--------+--------+--------------------------+---------+  CCA Prox   74       11                                                      +----------+--------+--------+--------+--------------------------+---------+  CCA Mid    71       10                                                      +----------+--------+--------+--------+--------------------------+---------+  CCA Distal 51       8        <50%     heterogenous                          +----------+--------+--------+--------+--------------------------+---------+  ICA Prox   63       16       1-39%    heterogenous               Shadowing  +----------+--------+--------+--------+--------------------------+---------+  ICA Mid    72       18                                                      +----------+--------+--------+--------+--------------------------+---------+  ICA Distal 76       17                                                      +----------+--------+--------+--------+--------------------------+---------+  ECA        146      29                heterogenous and irregular            +----------+--------+--------+--------+--------------------------+---------+ +----------+--------+--------+----------------+-------------------+             PSV cm/s EDV cm/s Describe         Arm Pressure (mmHG)  +----------+--------+--------+----------------+-------------------+  Subclavian 169               Multiphasic, WNL 148                  +----------+--------+--------+----------------+-------------------+ +---------+--------+--+--------+--+---------+  Vertebral PSV cm/s 36 EDV  cm/s 10 Antegrade  +---------+--------+--+--------+--+---------+  Summary: Right Carotid: Velocities in the right ICA are consistent with a 1-39% stenosis.                Non-hemodynamically significant plaque <50% noted in the CCA. Left Carotid: Velocities in the left ICA are consistent with a 1-39% stenosis.               Non-hemodynamically significant plaque <50% noted in the CCA. Vertebrals:  Left vertebral artery demonstrates antegrade  flow. Right vertebral              artery demonstrates no discernable flow. Subclavians: Right subclavian artery flow was disturbed. Normal flow              hemodynamics were seen in the left subclavian artery. *See table(s) above for measurements and observations.  Electronically signed by Larae Grooms MD on 04/13/2019 at 8:25:44 AM.    Final       Carmell Austria, MD 05/04/2019, 10:37 AM  Cc: Binnie Rail, MD

## 2019-05-06 ENCOUNTER — Telehealth: Payer: Self-pay | Admitting: Gastroenterology

## 2019-05-06 NOTE — Telephone Encounter (Signed)
Patient called with ongoing lower abd pain and no BM for several days.  9/24 office note reviewed.  Recommended 2 fleets enemas tonight.  If insufficient results, then in AM put 238 grams miralax in 2 liters crystal light or other sugar free liquid (diabetic), and take 8 ounces every 30 minutes x 4.  If she is not well cleaned out after that, then wait 2 hours and drink the rest of the MiraLAX.  After she achieves a bowel purge, stop the Metamucil and use MiraLAX 1 capful twice daily.  Please have your clinical staff reach out to her early in the week and see how she is doing.

## 2019-05-07 NOTE — Telephone Encounter (Signed)
Thanks a lot [employer].   Bre, Can you please call her on Monday and let me know Thx  RG

## 2019-05-08 NOTE — Telephone Encounter (Signed)
Pt was able to pass some stool after using the enemas, states they were liquid stools.

## 2019-05-11 ENCOUNTER — Telehealth: Payer: Self-pay | Admitting: Gastroenterology

## 2019-05-12 NOTE — Telephone Encounter (Signed)
Called and spoke with patient-patient reports she is having "plain water" diarrhea still-has not taken Miralax in two days due to symptoms-patient informed to increase fluids-has been drinking Gatorade and Pedialyte- Patient reports she took "one dose of Imodium to try and tighten up my bowels"; patient advised to also try OTC Lomotil for symptom relief-patient report she does not feel like she is constipated at all-  please advise of alternative measures for symptom relief

## 2019-05-12 NOTE — Telephone Encounter (Signed)
She was severely constipated before. So some diarrhea is good. Can stop taking MiraLAX for 3 days, then can start half of a capful every day to keep her regular. Let us know how she is in 2 weeks.  Thx  RG

## 2019-05-12 NOTE — Telephone Encounter (Signed)
Spoke with pt and she is aware.

## 2019-05-31 ENCOUNTER — Telehealth: Payer: Self-pay

## 2019-05-31 NOTE — Telephone Encounter (Signed)
Copied from [place] [id number]. Topic: General - Inquiry >> May 31, 2019  2:52 PM Mathis Bud wrote: Reason for CRM: Patient called requesting a call back from [facility] regarding covid 19.  Patient would like to know if she would like to be tested Call back [phone]

## 2019-05-31 NOTE — Telephone Encounter (Signed)
Spoke with pt and she states she has not been feeling well this week. Has had a headache and scratchy throat. Was wanting to go get tested. I advised that she can go get tested at the site on [facility] without an appointment or an order.

## 2019-06-01 ENCOUNTER — Other Ambulatory Visit: Payer: Self-pay

## 2019-06-01 DIAGNOSIS — Z20822 Contact with and (suspected) exposure to covid-19: Secondary | ICD-10-CM

## 2019-06-03 LAB — NOVEL CORONAVIRUS, NAA: SARS-CoV-2, NAA: NOT DETECTED

## 2019-06-05 ENCOUNTER — Other Ambulatory Visit: Payer: Self-pay | Admitting: Internal Medicine

## 2019-06-05 MED ORDER — ALPRAZOLAM 0.5 MG PO TABS: ORAL_TABLET | ORAL | 0 refills | Status: DC

## 2019-06-05 NOTE — Telephone Encounter (Signed)
[place] Controlled Database Checked Last filled: 04/27/19 # 30 LOV w/you: 04/04/19 Next appt w/you: 10/04/19

## 2019-06-05 NOTE — Telephone Encounter (Signed)
Last RF 04/27/19 Last OV 04/04/19 Next OV 10/04/19

## 2019-06-20 ENCOUNTER — Ambulatory Visit: Payer: Medicare Other | Admitting: Gastroenterology

## 2019-06-20 ENCOUNTER — Ambulatory Visit (HOSPITAL_BASED_OUTPATIENT_CLINIC_OR_DEPARTMENT_OTHER)
Admission: RE | Admit: 2019-06-20 | Discharge: 2019-06-20 | Disposition: A | Discharge status: Home / Self Care | Payer: Medicare Other | Source: Ambulatory Visit | Attending: Gastroenterology | Admitting: Gastroenterology

## 2019-06-20 ENCOUNTER — Encounter: Payer: Self-pay | Admitting: Gastroenterology

## 2019-06-20 ENCOUNTER — Other Ambulatory Visit: Payer: Self-pay

## 2019-06-20 VITALS — BP 130/70 | HR 82 | Temp 98.4°F | Ht 67.0 in | Wt 137.0 lb

## 2019-06-20 DIAGNOSIS — N816 Rectocele: Secondary | ICD-10-CM

## 2019-06-20 DIAGNOSIS — K5909 Other constipation: Secondary | ICD-10-CM

## 2019-06-20 NOTE — Patient Instructions (Addendum)
If you are age 83 or older, your body mass index should be between 23-30. Your Body mass index is 21.46 kg/m. If this is out of the aforementioned range listed, please consider follow up with your Primary Care Provider.  If you are age 46 or younger, your body mass index should be between 19-25. Your Body mass index is 21.46 kg/m. If this is out of the aformentioned range listed, please consider follow up with your Primary Care Provider.   Please go to the 1st floor Radiology to have an Xray of your Abdomen completed.   Please purchase the following medications over the counter and take as directed: Restart Miralax 17 grams once daily only if you get constipated.   Follow up in 6 weeks.   Thank you,  Dr. Jackquline Denmark

## 2019-06-20 NOTE — Progress Notes (Signed)
Chief Complaint: Constipation  Referring Provider:  Binnie Rail, MD      ASSESSMENT AND PLAN;   #1. Chronic constipation with type III dyssynergic defecation (unable to expel rectal balloon on anorectal manometry at [facility] 2018, being followed by Dr. Derrill Kay). Has assoc IBS. CT AP 04/10/2019 showing large amount of stool in the rectum and distal sigmoid, pelvic floor laxity, thickening of transverse/descending colon. No clinical or biochemical evidence of colitis. Had impaction, req MiraLAX, then had profuse diarrhea with dehydration requiring IV fluids.  #2.  Rectocele  Plan: - X ray KUB 3 view - Restart Miralax 17g po qd only if gets contipated - If still with anorectal problems , biofeedback at [facility] or [facility]. - FU in 12 weeks.    HPI:    [name] is a 85 y.o. 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  Feels somewhat better.  Still with generalized abdominal discomfort with some abdominal bloating.  The constipation is better.  She has stopped taking MiraLAX.  Would take MiraLAX if she does not have any bowel movements for 2 to 3 days.   With significant constipation.  Has to strain, then gets even worse.  Underwent CT scan Abdo/pelvis which showed questionable colitis with significant stool in the distal sigmoid colon and in the rectum.  She was given empiric Cipro and Flagyl which she did not take as she was concerned about the side effects.  No fever or chills.  No melena or hematochezia.  Had extensive GI work-up at [facility] including colonoscopy December 2016 which was negative.  Random colonic biopsies did show lymphocytic colitis.  EGD 07/2010 was unremarkable.  CT scan previously had shown hiatal hernia.  He denies having any significant abdominal pain but does complain of abdominal bloating.  Drinks  plenty of water.  Denies having any upper GI symptoms including nausea, vomiting, heartburn, regurgitation, odynophagia or dysphagia.  No weight loss.  She has tried multiple medications for constipation.  Most recently Dulcolax suppositories which did not work.  Past GI work-up: -Colonoscopy by Dr.Koch 07/2015-negative.  Random Bx did show lymphocytic colitis. -CT May 2016: Type III hiatal hernia.  Postsurgical changes of small bowel resection, adhesions without bowel obstruction. -EGD 07/2010 Dr Donetta Potts.  Past Medical History:  Diagnosis Date   Anxiety    Arthritis    fingers   Bowel obstruction ([place])    Cancer of upper-outer quadrant of female breast ([place]) 08/21/2011   ER +  PR +  Her 2 -  Ki67 9%   0.9 cm invasive lobular  Carcinoma ,s/p central lumpectomy with sentinel node biopsy,  ER/PR positive s/p re-excion on 09/16/11 with final pathology showing atypical hyperplasia    CAP (community acquired pneumonia) 12/06/2016   Diabetes mellitus type 1 ([place])    type I- wears insulin pump   GERD (gastroesophageal reflux disease)    History of small bowel obstruction    Hypercholesteremia    Hypergastrinemia    Hypertension    Hypertension    Hyponatremia    Hypothyroid    Hypothyroidism    IBS (irritable bowel syndrome)    Osteoarthritis    Osteoporosis, post-menopausal    DEXA 12/02/11: -3.5 (with lomax gyn), declines bisphos due to bone pain   PAF (paroxysmal atrial fibrillation) ([place])    One episode  in 2008, spontaneously converted in the ED, no further treatment   Spondylosis    thoracic spine   Thyroid nodule dx 07/2011   [place] q 54moto follow calcified L thyroid nodule (43[place]53/[place]   Umbilical hernia     Past Surgical History:  Procedure Laterality Date   ABDOMINAL HYSTERECTOMY     ABDOMINAL HYSTERECTOMY  yrs ago   APPENDECTOMY     APPENDECTOMY  age 85  BLADDER REPAIR  5 yrs ago   BREAST LUMPECTOMY     BREAST SURGERY     biopsy    BREAST SURGERY   yrs ago   br bx   BREAST SURGERY  01/ 23/2013   left central lumpectomy and snbx, re-excision lumpectomy   CARDIOVASCULAR STRESS TEST  03/17/2012   colon surgery     Small intestines- took 6 inches out   COLONOSCOPY  07/11/2015   [facility] Dr KDerrill Kay  ESOPHAGOGASTRODUODENOSCOPY  07/17/2010   [facility]   LEFT HEART CATHETERIZATION WITH CORONARY ANGIOGRAM N/A 03/18/2012   Procedure: LEFT HEART CATHETERIZATION WITH CORONARY ANGIOGRAM;  Surgeon: JMinus Breeding MD;  [facility];  Service: Cardiovascular;  Laterality: N/A;   TONSILLECTOMY   age 85   Family History  Problem Relation Age of Onset   Cancer Sister        unknown   Hypertension Mother    Stroke Mother    Arthritis Father    Heart disease Father    Diabetes Son    Colon cancer Neg Hx    Esophageal cancer Neg Hx     Social History   Tobacco Use   Smoking status: Former Smoker    Packs/day: 1.00    Years: 10.00    Pack years: 10.00    Types: Cigarettes    Quit date: 08/27/1977    Years since quitting: 41.7   Smokeless tobacco: Never Used  Substance Use Topics   Alcohol use: Yes    Comment: rare   Drug use: No    Current Outpatient Medications  Medication Sig Dispense Refill   ALPRAZolam (XANAX) 0.5 MG tablet Take 1/2 tablet twice daily as needed for anxiety or sleep. 30 tablet 0   aspirin EC 81 MG tablet Take 81 mg by mouth daily.     B Complex-C (SUPER B COMPLEX PO) Take 1 tablet by mouth daily.     BD VEO INSULIN SYRINGE U/F 31G X 15/64" 0.3 ML MISC USE WITH INJECTIONS 100 each 0   bisacodyl (DULCOLAX) 10 MG suppository Place 1 suppository (10 mg total) rectally as needed for moderate constipation. 12 suppository 0   carboxymethylcellulose (REFRESH PLUS) 0.5 % SOLN Place 1 drop into both eyes 3 (three) times daily as needed. Non-preservative     cholecalciferol (VITAMIN D) 1000 UNITS tablet Take 5,000 Units by mouth daily.      fish oil-omega-3 fatty  acids 1000 MG capsule Take 2 g by mouth daily.     hydrochlorothiazide (HYDRODIURIL) 25 MG tablet Take 1 tablet (25 mg total) by mouth daily as needed (Swelling). 90 tablet 0   hydrochlorothiazide (HYDRODIURIL) 25 MG tablet Take 12.5 mg by mouth daily.     Insulin Glargine (LANTUS SOLOSTAR) 100 UNIT/ML Solostar Pen Inject 14 Units into the skin daily at 10 pm.      insulin lispro (HUMALOG KWIKPEN) 100 UNIT/ML KiwkPen Inject into the skin. Sliding scale before meal     linaclotide (LINZESS) 72 MCG capsule Take 1 capsule (72  mcg total) by mouth daily before breakfast. 30 capsule    losartan (COZAAR) 100 MG tablet Take 1 tablet (100 mg total) by mouth daily. 90 tablet 0   metoprolol succinate (TOPROL XL) 25 MG 24 hr tablet Take 0.5 tablets (12.5 mg total) by mouth as needed. AS NEEDED FOR PALPITATIONS 30 tablet 11   Multiple Vitamin (MULTIVITAMIN) tablet Take 1 tablet by mouth daily. For breast and bone health     ondansetron (ZOFRAN) 4 MG tablet Take 1 tablet (4 mg total) by mouth every 8 (eight) hours as needed for nausea or vomiting. 20 tablet 0   pantoprazole (PROTONIX) 40 MG tablet Take 1 tablet (40 mg total) by mouth daily. 30 tablet 5   Peppermint Oil (IBGARD) 90 MG CPCR Take 1 capsule by mouth daily.     saccharomyces boulardii (FLORASTOR) 250 MG capsule Take 1 capsule (250 mg total) by mouth 2 (two) times daily. 60 capsule 5   simvastatin (ZOCOR) 20 MG tablet TAKE ONE TABLET AT BEDTIME. 90 tablet 1   SYNTHROID 100 MCG tablet Take 1 tablet (100 mcg total) by mouth daily before breakfast.     No current facility-administered medications for this visit.     Allergies  Allergen Reactions   Eliquis [Apixaban] Other (See Comments)    Dizziness, severe headach   Amlodipine     Dizzy, nausea   Augmentin [Amoxicillin-Pot Clavulanate] Other (See Comments)    Gi upset only no rash or hives    Cortisone Other (See Comments)    Makes blood sugars run high   Keflex  [Cephalexin] Hives and Nausea Only    Stomach upset   Omeprazole Nausea Only    Dizziness and nausea   Prednisone     Other reaction(s): Other Makes blood sugars run high   Hydrocodone Rash    Rash to chest , side back   Latex Rash    Powder in gloves causes rash; "not allergic to latex just the powder"    Review of Systems:  Constitutional: Denies fever, chills, diaphoresis, appetite change and fatigue.  HEENT: Denies photophobia, eye pain, redness, hearing loss, ear pain, congestion, sore throat, rhinorrhea, sneezing, mouth sores, neck pain, neck stiffness and tinnitus.   Respiratory: Denies SOB, DOE, cough, chest tightness,  and wheezing.   Cardiovascular: Denies chest pain, palpitations and leg swelling.  Genitourinary: Denies dysuria, urgency, frequency, hematuria, flank pain and difficulty urinating.  Musculoskeletal: Denies myalgias, back pain, joint swelling, arthralgias and gait problem.  Skin: No rash.  Neurological: Denies dizziness, seizures, syncope, weakness, light-headedness, numbness and headaches.  Hematological: Denies adenopathy. Easy bruising, personal or family bleeding history  Psychiatric/Behavioral: No anxiety or depression     Physical Exam:    BP (!) 157/78 Comment: verbalized   Pulse 85    Temp 98.3 F (36.8 C)    Ht '5\' 7"'$  (1.702 m)    Wt 137 lb 8 oz (62.4 kg)    BMI 21.54 kg/m  Filed Weights   05/04/19 0956  Weight: 137 lb 8 oz (62.4 kg)   Constitutional:  Well-developed, in no acute distress. Psychiatric: Normal mood and affect. Behavior is normal. HEENT: Pupils normal.  Conjunctivae are normal. No scleral icterus. Neck supple.  Cardiovascular: Normal rate, regular rhythm. No edema Pulmonary/chest: Effort normal and breath sounds normal. No wheezing, rales or rhonchi. Abdominal: Soft, nondistended. Nontender. Bowel sounds active throughout. There are no masses palpable. No hepatomegaly. Rectal: Mild rectal stenosis, suggestion of rectocele,  brown heme-negative stools.  Rectal  vault appears to be empty. Seen in presence of Herma Mering CMA. Neurological: Alert and oriented to person place and time. Skin: Skin is warm and dry. No rashes noted.  Data Reviewed: I have personally reviewed following labs and imaging studies  CBC: CBC Latest Ref Rng & Units 04/14/2019 01/25/2019 10/04/2018  WBC 4.0 - 10.5 K/uL 7.5 8.1 8.0  Hemoglobin 12.0 - 15.0 g/dL 12.8 13.0 13.1  Hematocrit 36.0 - 46.0 % 37.7 37.8 38.3  Platelets 150.0 - 400.0 K/uL 225.0 219.0 278.0    CMP: CMP Latest Ref Rng & Units 04/14/2019 04/10/2019 01/25/2019  Glucose 70 - 99 mg/dL 92 - 122(H)  BUN 6 - 23 mg/dL 8 - 15  Creatinine 0.40 - 1.20 mg/dL 0.72 0.70 0.70  Sodium 135 - 145 mEq/L 133(L) - 131(L)  Potassium 3.5 - 5.1 mEq/L 3.7 - 4.0  Chloride 96 - 112 mEq/L 99 - 98  CO2 19 - 32 mEq/L 27 - 26  Calcium 8.4 - 10.5 mg/dL 9.8 - 10.0  Total Protein 6.0 - 8.3 g/dL 7.6 - 7.5  Total Bilirubin 0.2 - 1.2 mg/dL 0.5 - 0.4  Alkaline Phos 39 - 117 U/L 74 - 79  AST 0 - 37 U/L 24 - 24  ALT 0 - 35 U/L 12 - 15      Radiology Studies: Ct Abdomen Pelvis W Contrast  Result Date: 04/10/2019 CLINICAL DATA:  Abdominal distention and generalized abdominal pain. EXAM: CT ABDOMEN AND PELVIS WITH CONTRAST TECHNIQUE: Multidetector CT imaging of the abdomen and pelvis was performed using the standard protocol following bolus administration of intravenous contrast. CONTRAST:  134m OMNIPAQUE IOHEXOL 300 MG/ML  SOLN COMPARISON:  March 11, 2016. FINDINGS: Lower chest: The lung bases are clear. The heart size is normal. Hepatobiliary: The liver is normal. Normal gallbladder.There is no biliary ductal dilation. Pancreas: Normal contours without ductal dilatation. No peripancreatic fluid collection. Spleen: No splenic laceration or hematoma. Adrenals/Urinary Tract: --Adrenal glands: No adrenal hemorrhage. --Right kidney/ureter: No hydronephrosis or perinephric hematoma. --Left kidney/ureter: No  hydronephrosis or perinephric hematoma. --Urinary bladder: Unremarkable. Stomach/Bowel: --Stomach/Duodenum: There is a small hiatal hernia. There may be some mild wall thickening of the gastric antrum/pylorus. --Small bowel: There is fecalization of the distal small bowel. There is no evidence for small bowel obstruction. --Colon: There is a large amount of stool in the rectum importance of the sigmoid colon. There is diffuse wall thickening of the transverse colon and descending colon. There is probable pelvic 4 laxity with evidence for the potential rectocele. --Appendix: Not visualized. No right lower quadrant inflammation or free fluid. Vascular/Lymphatic: Atherosclerotic calcification is present within the non-aneurysmal abdominal aorta, without hemodynamically significant stenosis. --No retroperitoneal lymphadenopathy. --No mesenteric lymphadenopathy. --No pelvic or inguinal lymphadenopathy. Reproductive: Status post hysterectomy. No adnexal mass. Other: No ascites or free air. The abdominal wall is normal. Musculoskeletal. No acute displaced fractures. IMPRESSION: 1. Findings suspicious for infectious or inflammatory colitis involving the transverse and descending colon. There is a large amount of stool in the rectum and portions of the sigmoid colon. 2. Fecalization of the distal small bowel consistent with slow transit. 3. Apparent pelvic floor laxity with a probable rectocele. 4.  Aortic Atherosclerosis (ICD10-I70.0). Electronically Signed   By: CConstance HolsterM.D.   On: 04/10/2019 17:36   Dg Abd 2 Views  Result Date: 04/05/2019 CLINICAL DATA:  Abdominal pain constipation EXAM: ABDOMEN - 2 VIEW COMPARISON:  01/25/2017 FINDINGS: Normal bowel gas pattern. No obstruction or ileus. Mild amount of stool in the  right colon. No free air. Atherosclerotic aorta. Mild dextroscoliosis. No acute skeletal abnormality. IMPRESSION: Normal bowel gas pattern.  Mild stool in the right colon. Electronically Signed    By: Franchot Gallo M.D.   On: 04/05/2019 08:46   Vas US Carotid  Result Date: 04/13/2019 Carotid Arterial Duplex Study Indications:  Bilateral bruits, Carotid artery disease and Patient denies any               cerebrovascular symptoms. Risk Factors: Hypertension, hyperlipidemia, Diabetes, past history of smoking. Performing Technologist: Wilkie Aye RVT  Examination Guidelines: A complete evaluation includes B-mode imaging, spectral Doppler, color Doppler, and power Doppler as needed of all accessible portions of each vessel. Bilateral testing is considered an integral part of a complete examination. Limited examinations for reoccurring indications may be performed as noted.  Right Carotid Findings: +----------+--------+--------+--------+--------------------------+---------+             PSV cm/s EDV cm/s Stenosis Plaque Description         Comments   +----------+--------+--------+--------+--------------------------+---------+  CCA Prox   66       7                                                       +----------+--------+--------+--------+--------------------------+---------+  CCA Mid    66       9                                                       +----------+--------+--------+--------+--------------------------+---------+  CCA Distal 50       11       <50%     heterogenous                          +----------+--------+--------+--------+--------------------------+---------+  ICA Prox   57       14       1-39%    heterogenous and irregular Shadowing  +----------+--------+--------+--------+--------------------------+---------+  ICA Mid    111      26                                                      +----------+--------+--------+--------+--------------------------+---------+  ICA Distal 58       14                                                      +----------+--------+--------+--------+--------------------------+---------+  ECA        92       5                 hypoechoic and irregular               +----------+--------+--------+--------+--------------------------+---------+ +----------+--------+-------+---------+-------------------+             PSV cm/s EDV cms Describe  Arm Pressure (mmHG)  +----------+--------+-------+---------+-------------------+  Subclavian  171              Turbulent 140                  +----------+--------+-------+---------+-------------------+ +---------+--------+--------+------+  Vertebral PSV cm/s EDV cm/s Absent  +---------+--------+--------+------+ Left Carotid Findings: +----------+--------+--------+--------+--------------------------+---------+             PSV cm/s EDV cm/s Stenosis Plaque Description         Comments   +----------+--------+--------+--------+--------------------------+---------+  CCA Prox   74       11                                                      +----------+--------+--------+--------+--------------------------+---------+  CCA Mid    71       10                                                      +----------+--------+--------+--------+--------------------------+---------+  CCA Distal 51       8        <50%     heterogenous                          +----------+--------+--------+--------+--------------------------+---------+  ICA Prox   63       16       1-39%    heterogenous               Shadowing  +----------+--------+--------+--------+--------------------------+---------+  ICA Mid    72       18                                                      +----------+--------+--------+--------+--------------------------+---------+  ICA Distal 76       17                                                      +----------+--------+--------+--------+--------------------------+---------+  ECA        146      29                heterogenous and irregular            +----------+--------+--------+--------+--------------------------+---------+ +----------+--------+--------+----------------+-------------------+             PSV cm/s EDV cm/s Describe         Arm Pressure  (mmHG)  +----------+--------+--------+----------------+-------------------+  Subclavian 169               Multiphasic, WNL 148                  +----------+--------+--------+----------------+-------------------+ +---------+--------+--+--------+--+---------+  Vertebral PSV cm/s 36 EDV cm/s 10 Antegrade  +---------+--------+--+--------+--+---------+  Summary: Right Carotid: Velocities in the right ICA are consistent with a 1-39% stenosis.                Non-hemodynamically significant plaque <50%  noted in the CCA. Left Carotid: Velocities in the left ICA are consistent with a 1-39% stenosis.               Non-hemodynamically significant plaque <50% noted in the CCA. Vertebrals:  Left vertebral artery demonstrates antegrade flow. Right vertebral              artery demonstrates no discernable flow. Subclavians: Right subclavian artery flow was disturbed. Normal flow              hemodynamics were seen in the left subclavian artery. *See table(s) above for measurements and observations.  Electronically signed by Larae Grooms MD on 04/13/2019 at 8:25:44 AM.    Final       Carmell Austria, MD 05/04/2019, 10:37 AM  Cc: Binnie Rail, MD

## 2019-06-21 ENCOUNTER — Other Ambulatory Visit: Payer: Self-pay

## 2019-06-21 DIAGNOSIS — N816 Rectocele: Secondary | ICD-10-CM

## 2019-06-21 DIAGNOSIS — K589 Irritable bowel syndrome without diarrhea: Secondary | ICD-10-CM

## 2019-06-21 DIAGNOSIS — K5909 Other constipation: Secondary | ICD-10-CM

## 2019-06-22 ENCOUNTER — Encounter: Payer: Self-pay | Admitting: Internal Medicine

## 2019-07-10 ENCOUNTER — Ambulatory Visit (HOSPITAL_BASED_OUTPATIENT_CLINIC_OR_DEPARTMENT_OTHER)
Admission: RE | Admit: 2019-07-10 | Discharge: 2019-07-10 | Disposition: A | Discharge status: Home / Self Care | Payer: Medicare Other | Source: Ambulatory Visit | Attending: Gastroenterology | Admitting: Gastroenterology

## 2019-07-10 ENCOUNTER — Other Ambulatory Visit: Payer: Self-pay

## 2019-07-10 DIAGNOSIS — K589 Irritable bowel syndrome without diarrhea: Secondary | ICD-10-CM | POA: Diagnosis present

## 2019-07-10 DIAGNOSIS — N816 Rectocele: Secondary | ICD-10-CM

## 2019-07-10 DIAGNOSIS — K5909 Other constipation: Secondary | ICD-10-CM

## 2019-07-11 ENCOUNTER — Other Ambulatory Visit: Payer: Self-pay | Admitting: Internal Medicine

## 2019-07-11 MED ORDER — ALPRAZOLAM 0.5 MG PO TABS: ORAL_TABLET | ORAL | 0 refills | Status: DC

## 2019-07-11 NOTE — Telephone Encounter (Signed)
Check [name] registry last filled 06/05/2019.Marland KitchenJohny Chess

## 2019-07-21 ENCOUNTER — Other Ambulatory Visit: Payer: Self-pay | Admitting: Internal Medicine

## 2019-08-07 ENCOUNTER — Telehealth: Payer: Self-pay | Admitting: Internal Medicine

## 2019-08-07 NOTE — Telephone Encounter (Signed)
Copied from [place] [id number]. Topic: Quick Communication - Rx Refill/Question >> Aug 07, 2019 12:44 PM Leward Quan A wrote: Medication: ALPRAZolam Duanne Moron) 0.5 MG tablet   Has the patient contacted their pharmacy? Yes.   (Agent: If no, request that the patient contact the pharmacy for the refill.) (Agent: If yes, when and what did the pharmacy advise?)  Preferred Pharmacy (with phone number or street name): [place], [place].  Phone:  [phone] Fax:  [phone]     Agent: Please be advised that RX refills may take up to 3 business days. We ask that you follow-up with your pharmacy.

## 2019-08-07 NOTE — Telephone Encounter (Signed)
Copied from [place] [id number]. Topic: General - Other >> Aug 07, 2019 12:47 PM Leward Quan A wrote: Reason for CRM: Patient asking for Lovena Le to give her a call back when ever possible. Ph#  [phone]

## 2019-08-08 ENCOUNTER — Other Ambulatory Visit: Payer: Self-pay | Admitting: Internal Medicine

## 2019-08-08 DIAGNOSIS — F411 Generalized anxiety disorder: Secondary | ICD-10-CM

## 2019-08-08 MED ORDER — ALPRAZOLAM 0.5 MG PO TABS: ORAL_TABLET | ORAL | 0 refills | Status: DC

## 2019-08-08 NOTE — Telephone Encounter (Signed)
RX sent

## 2019-08-08 NOTE — Telephone Encounter (Signed)
Spoke with patient today. She has been informed that refill has been sent in to her pharmacy today.

## 2019-08-16 ENCOUNTER — Ambulatory Visit (INDEPENDENT_AMBULATORY_CARE_PROVIDER_SITE_OTHER): Payer: Medicare PPO

## 2019-08-16 ENCOUNTER — Other Ambulatory Visit: Payer: Self-pay

## 2019-08-16 ENCOUNTER — Encounter: Payer: Self-pay | Admitting: Internal Medicine

## 2019-08-16 ENCOUNTER — Ambulatory Visit (INDEPENDENT_AMBULATORY_CARE_PROVIDER_SITE_OTHER): Payer: Medicare PPO | Admitting: Internal Medicine

## 2019-08-16 VITALS — BP 168/84 | HR 82 | Temp 98.5°F | Resp 16 | Ht 67.0 in | Wt 137.8 lb

## 2019-08-16 DIAGNOSIS — R350 Frequency of micturition: Secondary | ICD-10-CM | POA: Diagnosis not present

## 2019-08-16 DIAGNOSIS — M546 Pain in thoracic spine: Secondary | ICD-10-CM | POA: Diagnosis not present

## 2019-08-16 DIAGNOSIS — G8929 Other chronic pain: Secondary | ICD-10-CM | POA: Insufficient documentation

## 2019-08-16 DIAGNOSIS — M4804 Spinal stenosis, thoracic region: Secondary | ICD-10-CM | POA: Diagnosis not present

## 2019-08-16 DIAGNOSIS — M5134 Other intervertebral disc degeneration, thoracic region: Secondary | ICD-10-CM | POA: Diagnosis not present

## 2019-08-16 LAB — POCT URINALYSIS DIPSTICK
Bilirubin, UA: NEGATIVE
Blood, UA: NEGATIVE
Glucose, UA: NEGATIVE
Ketones, UA: NEGATIVE
Leukocytes, UA: NEGATIVE
Nitrite, UA: NEGATIVE
Protein, UA: NEGATIVE
Spec Grav, UA: 1.015 (ref 1.010–1.025)
Urobilinogen, UA: NEGATIVE E.U./dL — AB
pH, UA: 7 (ref 5.0–8.0)

## 2019-08-16 MED ORDER — BACLOFEN 10 MG PO TABS: 10.0000 mg | ORAL_TABLET | Freq: Three times a day (TID) | ORAL | 0 refills | Status: DC | PRN

## 2019-08-16 NOTE — Patient Instructions (Signed)
Have an xray of your back done downstairs.   Apply heat/ice.  Take tylenol.    Try the baclofen three times a day as needed which is a muscle relaxer.   If your pain does not improve let me know know and we will have you see a specialist.

## 2019-08-16 NOTE — Assessment & Plan Note (Signed)
Recent urinary frequency Unlikely to be related to back pain since that was sudden onset and likely musculoskeletal Urine dip here negative, we will not pursue culture since it is unlikely a UTI

## 2019-08-16 NOTE — Progress Notes (Signed)
Subjective:    Patient ID: [name], female    DOB: 11-29-1928, 86 y.o.   MRN: [id number]  HPI The patient is here for an acute visit left-sided rib pain.   Yesterday she rode to the grocery store with her husband and stayed in the car as her husband went there.  While she was sitting there she had sudden onset of mid back pain.  She describes as intercostal pain, which she has had in the past.  There is no trauma or injury.  She states she was just sitting there.  It has been constant since then and on occasion she will have an intermittent sharp pain that is severe and will take her breath away.  She is able to take deep breaths and denies any coughing or wheezing.  Movement and deep breaths make the pain worse.  Applying heat has helped.  Tylenol has helped minimally.  She has had some increase in urinary frequency, but denies any dysuria or hematuria.  Medications and allergies reviewed with patient and updated if appropriate.  Patient Active Problem List   Diagnosis Date Noted  . Nausea 10/04/2018  . Medication side effects, initial encounter 02/01/2018  . Atrial fibrillation ([place]) 02/01/2018  . Leg cramps 07/05/2017  . Constipation 01/25/2017  . Closed fracture of proximal end of right humerus 07/09/2016  . Cough 06/17/2016  . Abdominal pain 03/10/2016  . Hearing loss 01/03/2016  . Allergic rhinitis 01/03/2016  . Minimal Coronary Plaque by Cardiac Cath in 2013 09/11/2015  . Palpitations 09/06/2015  . Frequent loose stools 08/28/2015  . Hyponatremia   . Acute mesenteric ischemia ([place])   . Hypothyroidism 07/11/2012  . Syncope 03/18/2012  . Abnormal LFTs   . Hypercholesteremia   . Hypertension   . Diabetes mellitus type 1 ([place])   . Osteoarthritis   . Thyroid nodule   . Osteoporosis, post-menopausal   . Primary cancer of upper outer quadrant of left female breast ([place]) 08/21/2011    Current Outpatient Medications on File Prior to Visit  Medication Sig  Dispense Refill  . ALPRAZolam (XANAX) 0.5 MG tablet Take 1/2 tablet twice daily as needed for anxiety or sleep. 30 tablet 0  . aspirin EC 81 MG tablet Take 81 mg by mouth daily.    . B Complex-C (SUPER B COMPLEX PO) Take 1 tablet by mouth daily.    . BD VEO INSULIN SYRINGE U/F 31G X 15/64" 0.3 ML MISC USE WITH INJECTIONS 100 each 1  . bisacodyl (DULCOLAX) 10 MG suppository Place 1 suppository (10 mg total) rectally as needed for moderate constipation. 12 suppository 0  . carboxymethylcellulose (REFRESH PLUS) 0.5 % SOLN Place 1 drop into both eyes 3 (three) times daily as needed. Non-preservative    . cholecalciferol (VITAMIN D) 1000 UNITS tablet Take 5,000 Units by mouth daily.     . Continuous Blood Gluc Sensor (Foster) MISC by Does not apply route. Sensor is located in Right arm    . fish oil-omega-3 fatty acids 1000 MG capsule Take 2 g by mouth daily.    . hydrochlorothiazide (HYDRODIURIL) 25 MG tablet Take 1 tablet (25 mg total) by mouth daily as needed (Swelling). 90 tablet 0  . hydrochlorothiazide (HYDRODIURIL) 25 MG tablet Take 12.5 mg by mouth daily.    . Insulin Glargine (LANTUS SOLOSTAR) 100 UNIT/ML Solostar Pen Inject 14 Units into the skin daily at 10 pm.     . insulin lispro (HUMALOG KWIKPEN) 100 UNIT/ML  KiwkPen Inject into the skin. Sliding scale before meal    . linaclotide (LINZESS) 72 MCG capsule Take 1 capsule (72 mcg total) by mouth daily before breakfast. 30 capsule   . losartan (COZAAR) 100 MG tablet Take 1 tablet (100 mg total) by mouth daily. 90 tablet 0  . metoprolol succinate (TOPROL XL) 25 MG 24 hr tablet Take 0.5 tablets (12.5 mg total) by mouth as needed. AS NEEDED FOR PALPITATIONS 30 tablet 11  . Multiple Vitamin (MULTIVITAMIN) tablet Take 1 tablet by mouth daily. For breast and bone health    . ondansetron (ZOFRAN) 4 MG tablet Take 1 tablet (4 mg total) by mouth every 8 (eight) hours as needed for nausea or vomiting. 20 tablet 0  . pantoprazole  (PROTONIX) 40 MG tablet Take 1 tablet (40 mg total) by mouth daily. 30 tablet 5  . Peppermint Oil (IBGARD) 90 MG CPCR Take 1 capsule by mouth daily.    [name] saccharomyces boulardii (FLORASTOR) 250 MG capsule Take 1 capsule (250 mg total) by mouth 2 (two) times daily. 60 capsule 5  . simvastatin (ZOCOR) 20 MG tablet TAKE ONE TABLET AT BEDTIME. 90 tablet 1  . SYNTHROID 100 MCG tablet Take 1 tablet (100 mcg total) by mouth daily before breakfast.     No current facility-administered medications on file prior to visit.    Past Medical History:  Diagnosis Date  . Anxiety   . Arthritis    fingers  . Bowel obstruction ([place])   . Cancer of upper-outer quadrant of female breast ([place]) 08/21/2011   ER +  PR +  Her 2 -  Ki67 9%   0.9 cm invasive lobular  Carcinoma ,s/p central lumpectomy with sentinel node biopsy,  ER/PR positive s/p re-excion on 09/16/11 with final pathology showing atypical hyperplasia   . CAP (community acquired pneumonia) 12/06/2016  . Diabetes mellitus type 1 ([facility])    type I- wears insulin pump  . GERD (gastroesophageal reflux disease)   . History of small bowel obstruction   . Hypercholesteremia   . Hypergastrinemia   . Hypertension   . Hypertension   . Hyponatremia   . Hypothyroid   . Hypothyroidism   . IBS (irritable bowel syndrome)   . Osteoarthritis   . Osteoporosis, post-menopausal    DEXA 12/02/11: -3.5 (with lomax gyn), declines bisphos due to bone pain  . PAF (paroxysmal atrial fibrillation) ([place])    One episode in 2008, spontaneously converted in the ED, no further treatment  . Spondylosis    thoracic spine  . Thyroid nodule dx 07/2011   [place] q 35moto follow calcified L thyroid nodule (12/08/11 [place]  . Umbilical hernia     Past Surgical History:  Procedure Laterality Date  . ABDOMINAL HYSTERECTOMY    . ABDOMINAL HYSTERECTOMY  yrs ago  . APPENDECTOMY    . APPENDECTOMY  age 86 . BLADDER REPAIR  5 yrs ago  . BREAST LUMPECTOMY    . BREAST SURGERY     biopsy  .  BREAST SURGERY   yrs ago   br bx  . BREAST SURGERY  01/ 23/2013   left central lumpectomy and snbx, re-excision lumpectomy  . CARDIOVASCULAR STRESS TEST  03/17/2012  . colon surgery     Small intestines- took 6 inches out  . COLONOSCOPY  07/11/2015   [facility] Dr KDerrill Kay . ESOPHAGOGASTRODUODENOSCOPY  07/17/2010   [facility]  . LEFT HEART CATHETERIZATION WITH CORONARY ANGIOGRAM N/A 03/18/2012   Procedure: LEFT  HEART CATHETERIZATION WITH CORONARY ANGIOGRAM;  Surgeon: Minus Breeding, MD;  Location: [facility] CATH LAB;  Service: Cardiovascular;  Laterality: N/A;  . TONSILLECTOMY   age 51    Social History   Socioeconomic History  . Marital status: Married    Spouse name: Not on file  . Number of children: Not on file  . Years of education: Not on file  . Highest education level: Not on file  Occupational History  . Not on file  Tobacco Use  . Smoking status: Former Smoker    Packs/day: 1.00    Years: 10.00    Pack years: 10.00    Types: Cigarettes    Quit date: 08/27/1977    Years since quitting: 41.9  . Smokeless tobacco: Never Used  Substance and Sexual Activity  . Alcohol use: Yes    Comment: rare  . Drug use: No  . Sexual activity: Yes    Birth control/protection: Post-menopausal  Other Topics Concern  . Not on file  Social History Narrative   ** Merged History Encounter **       Social Determinants of Health   Financial Resource Strain:   . Difficulty of Paying Living Expenses: Not on file  Food Insecurity:   . Worried About Charity fundraiser in the Last Year: Not on file  . Ran Out of Food in the Last Year: Not on file  Transportation Needs:   . Lack of Transportation (Medical): Not on file  . Lack of Transportation (Non-Medical): Not on file  Physical Activity:   . Days of Exercise per Week: Not on file  . Minutes of Exercise per Session: Not on file  Stress:   . Feeling of Stress : Not on file  Social Connections:   . Frequency of Communication with Friends  and Family: Not on file  . Frequency of Social Gatherings with Friends and Family: Not on file  . Attends Religious Services: Not on file  . Active Member of Clubs or Organizations: Not on file  . Attends Archivist Meetings: Not on file  . Marital Status: Not on file    Family History  Problem Relation Age of Onset  . Cancer Sister        unknown  . Hypertension Mother   . Stroke Mother   . Arthritis Father   . Heart disease Father   . Diabetes Son   . Colon cancer Neg Hx   . Esophageal cancer Neg Hx     Review of Systems  Constitutional: Negative for chills and fever.  Respiratory: Negative for cough, shortness of breath and wheezing.   Musculoskeletal:       Left posterior rib pain  Skin: Negative for color change and rash.       Objective:   Vitals:   08/16/19 1509  BP: (!) 168/84  Pulse: 82  Resp: 16  Temp: 98.5 F (36.9 C)  SpO2: 95%   BP Readings from Last 3 Encounters:  08/16/19 (!) 168/84  06/20/19 130/70  05/04/19 (!) 157/78   Wt Readings from Last 3 Encounters:  08/16/19 137 lb 12.8 oz (62.5 kg)  06/20/19 137 lb (62.1 kg)  05/04/19 137 lb 8 oz (62.4 kg)   Body mass index is 21.58 kg/m.   Physical Exam Constitutional:      General: She is not in acute distress.    Appearance: Normal appearance. She is not ill-appearing.  HENT:     Head: Normocephalic and atraumatic.  Musculoskeletal:  Comments: No tenderness with palpation along thoracic spine, no tenderness with palpation across mid back in area of pain.  She states pain is very deep.  Increased pain with deep breaths and movement.  Skin:    General: Skin is warm and dry.     Findings: Erythema (Area of erythema left side of lower thoracic spine from heating pad.  No rash.) present.  Neurological:     General: No focal deficit present.     Mental Status: She is alert.            Assessment & Plan:    See Problem List for Assessment and Plan of chronic medical  problems.     This visit occurred during the SARS-CoV-2 public health emergency.  Safety protocols were in place, including screening questions prior to the visit, additional usage of staff PPE, and extensive cleaning of exam room while observing appropriate contact time as indicated for disinfecting solutions.

## 2019-08-16 NOTE — Assessment & Plan Note (Signed)
Acute mid back pain starting yesterday while sitting in the car-no trauma or injury We will obtain a thoracic spine x-ray, but no tenderness and unlikely to be a fracture given circumstances of how it started Likely muscular in nature-muscle spasm Continue Tylenol as needed, heat We will try baclofen 10 mg 3 times daily as needed-discussed that this can cause some drowsiness and to take it with caution If no improvement will refer to sports medicine

## 2019-08-17 ENCOUNTER — Encounter: Payer: Self-pay | Admitting: Internal Medicine

## 2019-08-18 ENCOUNTER — Encounter: Payer: Self-pay | Admitting: Family Medicine

## 2019-08-18 ENCOUNTER — Ambulatory Visit (INDEPENDENT_AMBULATORY_CARE_PROVIDER_SITE_OTHER): Payer: Medicare PPO

## 2019-08-18 ENCOUNTER — Other Ambulatory Visit: Payer: Self-pay

## 2019-08-18 ENCOUNTER — Ambulatory Visit (INDEPENDENT_AMBULATORY_CARE_PROVIDER_SITE_OTHER): Payer: Medicare PPO | Admitting: Family Medicine

## 2019-08-18 VITALS — BP 166/84 | HR 82 | Ht 67.0 in | Wt 138.4 lb

## 2019-08-18 DIAGNOSIS — J439 Emphysema, unspecified: Secondary | ICD-10-CM | POA: Diagnosis not present

## 2019-08-18 DIAGNOSIS — M546 Pain in thoracic spine: Secondary | ICD-10-CM

## 2019-08-18 MED ORDER — TRAMADOL HCL 50 MG PO TABS: 50.0000 mg | ORAL_TABLET | Freq: Three times a day (TID) | ORAL | 0 refills | Status: DC | PRN

## 2019-08-18 MED ORDER — INDOMETHACIN 50 MG PO CAPS: 50.0000 mg | ORAL_CAPSULE | Freq: Three times a day (TID) | ORAL | 0 refills | Status: DC

## 2019-08-18 NOTE — Patient Instructions (Signed)
Thank you for coming in today. Get xray now.  STOP ibuprofen and use indomethacin 3x daily for a few days.  Use tramadol for severe pain.  Attend PT.  Let me know if not doing well or if worsening.   Make sure to take deep breaths about every 30 mins while awake.     Pleurisy  Pleurisy, also called pleuritis, is irritation and swelling (inflammation) of the linings of the lungs. The linings of the lungs are called pleura. They cover the outside of the lungs and the inside of the chest wall. There is a small amount of fluid (pleural fluid) between the pleura that allows the lungs to move in and out smoothly when you breathe. Pleurisy causes the pleura to be rough and dry and to rub together when you breathe, which is painful. In some cases, pleurisy can cause pleural fluid to build up between the pleura (pleural effusion). What are the causes? Common causes of this condition include:  A lung infection caused by bacteria or a virus.  A blood clot that travels to the lung (pulmonary embolism).  Air leaking into the pleural space (pneumothorax).  Lung cancer or a lung tumor.  A chest injury.  Diseases that can cause lung inflammation. These include rheumatoid arthritis, lupus, sickle cell disease, inflammatory bowel disease, and pancreatitis.  Heart or chest surgery.  Lung damage from inhaling asbestos.  A lung reaction to certain medicines. Sometimes the cause is unknown. What are the signs or symptoms? Chest pain is the main symptom of this condition. The pain is usually on one side. Chest pain may start suddenly and be sharp or stabbing. It may become a constant dull ache. You may also feel pain in your back or shoulder. The pain may get worse when you cough, take deep breaths, or make sudden movements. Other symptoms may include:  Shortness of breath.  Noisy breathing (wheezing).  Cough.  Chills.  Fever. How is this diagnosed? This condition may be diagnosed based  on:  Your medical history.  Your symptoms.  A physical exam. Your health care provider will listen to your breathing with a stethoscope to check for a rough, rubbing sound (friction rub). If you have pleural effusion, your breathing sounds may be muffled.  Tests, such as: ? Blood tests to check for infections or diseases and to measure the oxygen in your blood. ? Imaging studies of your lungs. These may include a chest X-ray, ultrasound, MRI, or CT scan. ? A procedure to remove pleural fluid with a needle for testing (thoracentesis). How is this treated? Treatment for this condition depends on the cause. Pleurisy that was caused by a virus usually clears up within 2 weeks. Treatment for pleurisy may include:  NSAIDs to help relieve pain and swelling.  Antibiotic medicines, if your condition was caused by a bacterial infection.  Prescription pain or cough medicine.  Medicines to dissolve a blood clot, if your condition was caused by pulmonary embolism.  Removal of pleural fluid or air. Follow these instructions at home: Medicines  Take over-the-counter and prescription medicines only as told by your health care provider.  If you were prescribed an antibiotic, take it as told by your health care provider. Do not stop taking the antibiotic even if you start to feel better. Activity  Rest and return to your normal activities as told by your health care provider. Ask your health care provider what activities are safe for you.  Do not drive or use heavy  machinery while taking prescription pain medicine. General instructions   Monitor your pleurisy for any changes.  Take deep breaths often, even if it is painful. This can help prevent lung infection (pneumonia) and collapse of lung tissue (atelectasis).  When lying down, lie on your painful side. This may reduce pain.  Do not smoke. If you need help quitting, ask your health care provider.  Keep all follow-up visits as told by  your health care provider. This is important. Contact a health care provider if:  You have pain that: ? Gets worse. ? Does not get better with medicine. ? Lasts for more than 1 week.  You have a fever or chills.  Your cough or shortness of breath is not improving at home.  You cough up pus-like (purulent) secretions. Get help right away if:  Your lips, fingernails, or toenails darken or turn blue.  You cough up blood.  You have any of the following symptoms that get worse: ? Difficulty breathing. ? Shortness of breath. ? Wheezing.  You have pain that spreads into your neck, arms, or jaw.  You develop a rash.  You vomit.  You faint. Summary  Pleurisy is inflammation of the linings of the lungs (pleura).  Pleurisy causes pain that makes it difficult for you to breathe or cough.  Pleurisy is often caused by an underlying infection or disease.  Treatment of pleurisy depends on the cause, and it often includes medicines. This information is not intended to replace advice given to you by your health care provider. Make sure you discuss any questions you have with your health care provider. Document Revised: 07/09/2017 Document Reviewed: 04/20/2016 Elsevier Patient Education  2020 [employer].

## 2019-08-18 NOTE — Progress Notes (Signed)
Subjective:    I'm seeing this patient as a consultation for:  Dr. Quay Burow. Note will be routed back to referring provider/PCP.  CC: L-sided posterior T-spine/rib pain  I, Molly Weber, LAT, ATC, am serving as scribe for Dr. Lynne Leader.  HPI: Pt is a 86 y/o female presenting w/ c/o insidious-onset L-sided posterior t-spine pain that began on 08/15/19 while she was sitting in the car waiting for her husband to come out of the grocery store.  She is having constant pain that will be intermittently sharp and severe and will "take her breath away."  Aggravating factors include movement and attempts at taking a deep breath.  She has tried heat and Tylenol w/ minimal relief.  She saw her PCP on 08/16/19 and was prescribed Baclofen 10mg  tid and had a T-spine swimmer's XR.  Since her last visit, pt reports increased pain and reports that her pain is now on the entire L side of her spine from her scapula to the L TL junction.  Pt rates her pain as a sharp 8/10 pain.  She states that the pain is interfering w/ her sleep.  She denies any pain into her UE or LEs.  Past medical history, Surgical history, Family history, Social history, Allergies, and medications have been entered into the medical record, reviewed.  Remote history of breast cancer  Review of Systems: No headache, visual changes, nausea, vomiting, diarrhea, constipation, dizziness, abdominal pain, skin rash, fevers, chills, night sweats, weight loss, swollen lymph nodes, body aches, joint swelling, muscle aches, chest pain, shortness of breath, mood changes, visual or auditory hallucinations.   Objective:    Vitals:   08/18/19 1122  BP: (!) 166/84  Pulse: 82  SpO2: 96%   General: Well Developed, well nourished, and in no acute distress.  Neuro/Psych: Alert and oriented x3, extra-ocular muscles intact, able to move all 4 extremities, sensation grossly intact. Skin: Warm and dry, no rashes noted.  Respiratory: Not using accessory muscles,  speaking in full sentences, trachea midline.  Normal work of breathing.  Clear to auscultation bilaterally Cardiovascular: Pulses palpable, no extremity edema.  No murmurs rubs or gallops.   Abdomen: Does not appear distended. MSK: T-spine: Nontender to spinal midline.  Tender palpation left thoracic paraspinal musculature.  Reproduce pain with upper extremity and scapular motion.  Lab and Radiology Results EXAM: THORACIC SPINE - 3 VIEWS  COMPARISON:  None.  FINDINGS: There is no evidence of thoracic spine fracture. Alignment is normal. Mild multilevel endplate sclerosis seen. Mild-to-moderate severity multilevel intervertebral disc space narrowing is noted throughout the thoracic spine. Moderate severity chronic increased interstitial lung markings are seen throughout the visualized portion of both lungs.  IMPRESSION: 1. No acute findings in the thoracic spine. 2. 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   Electronically Signed   By: Virgina Norfolk M.D.   On: 08/16/2019 23:53  I, Lynne Leader, personally (independently) visualized and performed the interpretation of the images attached in this note.  2 view chest x-ray images obtained today personally and independently reviewed No acute infiltrates.  No pleural effusion or pneumothorax.  No acute rib fractures. Await formal radiology review   Impression and Recommendations:    Assessment and Plan: 86 y.o. female with acute onset thoracic back pain.  Sinay has significant pain.  Pain most likely due to muscle dysfunction and spasm however would like to further evaluate and rule out other causes of pain including potentially pleuritis.  Plan to proceed with chest  x-ray as above.  We will treat with indomethacin for possible pleuritis but this will also help general pain as well.  Cautioned patient against taking ibuprofen with this medication.  Will use short duration of  indomethacin.  Additionally use limited tramadol for pain control.  Cautioned against Xanax with tramadol.  However most importantly will proceed with physical therapy trial as this will help reduce her muscle spasm and dysfunction now but also increase her thoracic strength and reduce exacerbations of this issue in the future.  If not improving next step would likely be CT scan of chest to further evaluate potential rib or subtle vertebral body injury as well as lung parenchyma or pleuritis issue.  PDMP reviewed during this encounter. Orders Placed This Encounter  Procedures  . DG Chest 2 View    Standing Status:   Future    Number of Occurrences:   1    Standing Expiration Date:   10/15/2020    Order Specific Question:   Reason for Exam (SYMPTOM  OR DIAGNOSIS REQUIRED)    Answer:   eval thoracic back pain. Rule out lung/rib pathology    Order Specific Question:   Preferred imaging location?    Answer:   [facility]    Order Specific Question:   Radiology Contrast Protocol - do NOT remove file path    Answer:   [email or web address]  . Ambulatory referral to Physical Therapy    Referral Priority:   Routine    Referral Type:   Physical Medicine    Referral Reason:   Specialty Services Required    Requested Specialty:   Physical Therapy    Number of Visits Requested:   1   Meds ordered this encounter  Medications  . indomethacin (INDOCIN) 50 MG capsule    Sig: Take 1 capsule (50 mg total) by mouth 3 (three) times daily with meals.    Dispense:  30 capsule    Refill:  0  . traMADol (ULTRAM) 50 MG tablet    Sig: Take 1 tablet (50 mg total) by mouth every 8 (eight) hours as needed for severe pain.    Dispense:  15 tablet    Refill:  0    Discussed warning signs or symptoms. Please see discharge instructions. Patient expresses understanding.   The above documentation has been reviewed and is accurate and complete Lynne Leader

## 2019-08-19 ENCOUNTER — Encounter: Payer: Self-pay | Admitting: Internal Medicine

## 2019-08-19 ENCOUNTER — Encounter: Payer: Self-pay | Admitting: Family Medicine

## 2019-08-21 ENCOUNTER — Encounter: Payer: Self-pay | Admitting: Family Medicine

## 2019-08-21 DIAGNOSIS — S22000A Wedge compression fracture of unspecified thoracic vertebra, initial encounter for closed fracture: Secondary | ICD-10-CM | POA: Insufficient documentation

## 2019-08-21 DIAGNOSIS — M40204 Unspecified kyphosis, thoracic region: Secondary | ICD-10-CM | POA: Diagnosis not present

## 2019-08-21 DIAGNOSIS — M519 Unspecified thoracic, thoracolumbar and lumbosacral intervertebral disc disorder: Secondary | ICD-10-CM | POA: Diagnosis not present

## 2019-08-21 DIAGNOSIS — M4854XD Collapsed vertebra, not elsewhere classified, thoracic region, subsequent encounter for fracture with routine healing: Secondary | ICD-10-CM | POA: Diagnosis not present

## 2019-08-21 DIAGNOSIS — M810 Age-related osteoporosis without current pathological fracture: Secondary | ICD-10-CM | POA: Insufficient documentation

## 2019-08-21 DIAGNOSIS — M546 Pain in thoracic spine: Secondary | ICD-10-CM | POA: Diagnosis not present

## 2019-08-21 NOTE — Progress Notes (Signed)
Chest x-ray showed a new compression fracture at T7 was not seen well on thoracic spine x-ray.  This could certainly explain your pain.  If not able to get pain controlled with typical conservative measures there is an injection that we can get you set up for.  Let me know how you are feeling today.

## 2019-08-22 DIAGNOSIS — S22000A Wedge compression fracture of unspecified thoracic vertebra, initial encounter for closed fracture: Secondary | ICD-10-CM | POA: Diagnosis not present

## 2019-08-22 DIAGNOSIS — S22060A Wedge compression fracture of T7-T8 vertebra, initial encounter for closed fracture: Secondary | ICD-10-CM | POA: Diagnosis not present

## 2019-08-23 ENCOUNTER — Encounter: Payer: Self-pay | Admitting: Family Medicine

## 2019-08-23 ENCOUNTER — Encounter: Payer: Self-pay | Admitting: Internal Medicine

## 2019-08-30 DIAGNOSIS — M546 Pain in thoracic spine: Secondary | ICD-10-CM | POA: Diagnosis not present

## 2019-09-01 ENCOUNTER — Telehealth: Payer: Self-pay | Admitting: Hematology and Oncology

## 2019-09-01 NOTE — Telephone Encounter (Signed)
Returned patient's phone call regarding rescheduling 01/25 appointment, patient had accidental injury and would like to reschedule to 03/01.

## 2019-09-04 ENCOUNTER — Inpatient Hospital Stay: Payer: Medicare PPO | Admitting: Hematology and Oncology

## 2019-09-04 ENCOUNTER — Encounter: Payer: Self-pay | Admitting: Internal Medicine

## 2019-09-06 DIAGNOSIS — M810 Age-related osteoporosis without current pathological fracture: Secondary | ICD-10-CM | POA: Diagnosis not present

## 2019-09-06 DIAGNOSIS — M4854XD Collapsed vertebra, not elsewhere classified, thoracic region, subsequent encounter for fracture with routine healing: Secondary | ICD-10-CM | POA: Diagnosis not present

## 2019-09-06 DIAGNOSIS — M40204 Unspecified kyphosis, thoracic region: Secondary | ICD-10-CM | POA: Diagnosis not present

## 2019-09-11 ENCOUNTER — Encounter: Payer: Self-pay | Admitting: Internal Medicine

## 2019-09-11 DIAGNOSIS — Z1152 Encounter for screening for COVID-19: Secondary | ICD-10-CM | POA: Diagnosis not present

## 2019-09-14 ENCOUNTER — Other Ambulatory Visit: Payer: Self-pay | Admitting: Internal Medicine

## 2019-09-14 DIAGNOSIS — F411 Generalized anxiety disorder: Secondary | ICD-10-CM

## 2019-09-14 NOTE — Telephone Encounter (Signed)
Last RF was 07/12/19 Last OV 04/04/19 Next OV 10/04/19

## 2019-09-19 DIAGNOSIS — Z20828 Contact with and (suspected) exposure to other viral communicable diseases: Secondary | ICD-10-CM | POA: Diagnosis not present

## 2019-09-19 DIAGNOSIS — R05 Cough: Secondary | ICD-10-CM | POA: Diagnosis not present

## 2019-09-20 ENCOUNTER — Ambulatory Visit (INDEPENDENT_AMBULATORY_CARE_PROVIDER_SITE_OTHER): Payer: Medicare PPO | Admitting: Internal Medicine

## 2019-09-20 ENCOUNTER — Encounter: Payer: Self-pay | Admitting: Internal Medicine

## 2019-09-20 DIAGNOSIS — J209 Acute bronchitis, unspecified: Secondary | ICD-10-CM | POA: Diagnosis not present

## 2019-09-20 MED ORDER — AZITHROMYCIN 250 MG PO TABS: ORAL_TABLET | ORAL | 0 refills | Status: DC

## 2019-09-20 NOTE — Assessment & Plan Note (Signed)
Acute Rapid Covid test yesterday negative Symptoms possibly bacterial in nature-we are restricted since this is a virtual visit, but will empirically treat with an antibiotic Start Z-Pak given her multiple allergies Over-the-counter cold medications as needed for symptom relief She will call if her symptoms do not improve or worsen-in that case she needed will need to be seen at the respiratory clinic for in person evaluation She agrees with plan

## 2019-09-20 NOTE — Progress Notes (Addendum)
Virtual Visit via Telephone Note  I connected with [name] on 09/20/19 at  1:15 PM EST by telephone and verified that I am speaking with the correct person using two identifiers.   I discussed the limitations of evaluation and management by telemedicine and the availability of in person appointments. The patient expressed understanding and agreed to proceed.  Present for the visit:  Myself, Dr Billey Gosling, [name].  The patient is currently at home and I am in the office.    No referring provider.    History of Present Illness: This is an acute visit for cold symptoms.  She has been experiencing diarrhea, but that is not an acute symptom.    3 days ago she started experiencing fatigue, nasal congestion, left-sided ear pain, sore throat on the left, cough that is occasionally productive of white or green sputum, heart racing that does not feel like atrial fibrillation, nausea and headaches.  Her husband does not have any symptoms.  They have not gone out or seen anyone and she feels she is low risk for Covid, but she did go and have a Covid test yesterday.  It was a rapid Covid test and it was negative.   BP 156/78, temp normal, pulse 77   Review of Systems  Constitutional: Positive for malaise/fatigue. Negative for fever.  HENT: Positive for congestion, ear pain (left) and sore throat (left).   Respiratory: Positive for cough and sputum production (white, green). Negative for shortness of breath and wheezing.   Cardiovascular: Positive for palpitations.  Gastrointestinal: Positive for diarrhea and nausea.  Neurological: Positive for headaches.      Social History   Socioeconomic History  . Marital status: Married    Spouse name: Not on file  . Number of children: Not on file  . Years of education: Not on file  . Highest education level: Not on file  Occupational History  . Not on file  Tobacco Use  . Smoking status: Former Smoker    Packs/day: 1.00   Years: 10.00    Pack years: 10.00    Types: Cigarettes    Quit date: 08/27/1977    Years since quitting: 42.0  . Smokeless tobacco: Never Used  Substance and Sexual Activity  . Alcohol use: Yes    Comment: rare  . Drug use: No  . Sexual activity: Yes    Birth control/protection: Post-menopausal  Other Topics Concern  . Not on file  Social History Narrative   ** Merged History Encounter **       Social Determinants of Health   Financial Resource Strain:   . Difficulty of Paying Living Expenses: Not on file  Food Insecurity:   . Worried About Charity fundraiser in the Last Year: Not on file  . Ran Out of Food in the Last Year: Not on file  Transportation Needs:   . Lack of Transportation (Medical): Not on file  . Lack of Transportation (Non-Medical): Not on file  Physical Activity:   . Days of Exercise per Week: Not on file  . Minutes of Exercise per Session: Not on file  Stress:   . Feeling of Stress : Not on file  Social Connections:   . Frequency of Communication with Friends and Family: Not on file  . Frequency of Social Gatherings with Friends and Family: Not on file  . Attends Religious Services: Not on file  . Active Member of Clubs or Organizations: Not on file  . Attends  Club or Organization Meetings: Not on file  . Marital Status: Not on file       Assessment and Plan:  Area was elevated, but she does not feel that it was atrial fibrillation.  She does have a history of it and advised her that she needs to monitor her blood pressure and heart rate.  Most likely heart racing was secondary to infection.  Her diarrhea is more of a chronic issue and she will follow up with her gastroenterologist  See Problem List for Assessment and Plan of chronic medical problems.   Follow Up Instructions:    I discussed the assessment and treatment plan with the patient. The patient was provided an opportunity to ask questions and all were answered. The patient agreed  with the plan and demonstrated an understanding of the instructions.   The patient was advised to call back or seek an in-person evaluation if the symptoms worsen or if the condition fails to improve as anticipated.  Time spent on telephone call: 13 minutes   Binnie Rail, MD

## 2019-09-29 DIAGNOSIS — M4854XD Collapsed vertebra, not elsewhere classified, thoracic region, subsequent encounter for fracture with routine healing: Secondary | ICD-10-CM | POA: Diagnosis not present

## 2019-10-03 NOTE — Progress Notes (Signed)
Subjective:    Patient ID: [name], female    DOB: [birth year], 86 y.o.   MRN: [id number]  HPI The patient is here for follow up of their chronic medical problems, including chronic constipation, back pain from thoracic fracture, Afib, hypertension, diabetes, hypothyroidism   She is taking all of her medications as prescribed.    Thoracic fx:  She is following with ortho.  She still has mid back pain.  She wears a brace.  She is taking tylenol.      Her bowels are still not good.  She has pains in her abdomen and has to run to the bathroom.  She has a lot of gas and then a BM that is very smelly.  She has liquidy stool.  She is taking miralax daily.  She is taking miralax daily, but is no longer taking the metamucil.  She is taking imodium.    She is very tired.   Her sister died recently after her covid vaccine and she wonders if that was the cause.    Medications and allergies reviewed with patient and updated if appropriate.  Patient Active Problem List   Diagnosis Date Noted  . Acute bronchitis 09/20/2019  . Urinary frequency 08/16/2019  . Acute midline thoracic back pain 08/16/2019  . Nausea 10/04/2018  . Medication side effects, initial encounter 02/01/2018  . Atrial fibrillation ([place]) 02/01/2018  . Leg cramps 07/05/2017  . Constipation 01/25/2017  . Closed fracture of proximal end of right humerus 07/09/2016  . Cough 06/17/2016  . Abdominal pain 03/10/2016  . Hearing loss 01/03/2016  . Allergic rhinitis 01/03/2016  . Minimal Coronary Plaque by Cardiac Cath in 2013 09/11/2015  . Palpitations 09/06/2015  . Frequent loose stools 08/28/2015  . Hyponatremia   . Acute mesenteric ischemia ([place])   . Hypothyroidism 07/11/2012  . Syncope 03/18/2012  . Abnormal LFTs   . Hypercholesteremia   . Hypertension   . Diabetes mellitus type 1 ([place])   . Osteoarthritis   . Thyroid nodule   . Osteoporosis, post-menopausal   . Primary cancer of upper outer  quadrant of left female breast ([place]) 08/21/2011    Current Outpatient Medications on File Prior to Visit  Medication Sig Dispense Refill  . ALPRAZolam (XANAX) 0.5 MG tablet TAKE 1/2 TABLET TWICE DAILY AS NEEDED FOR ANXIETY OR SLEEP 30 tablet 0  . aspirin EC 81 MG tablet Take 81 mg by mouth daily.    . B Complex-C (SUPER B COMPLEX PO) Take 1 tablet by mouth daily.    . baclofen (LIORESAL) 10 MG tablet Take 1 tablet (10 mg total) by mouth 3 (three) times daily as needed for muscle spasms. 30 each 0  . BD VEO INSULIN SYRINGE U/F 31G X 15/64" 0.3 ML MISC USE WITH INJECTIONS 100 each 1  . bisacodyl (DULCOLAX) 10 MG suppository Place 1 suppository (10 mg total) rectally as needed for moderate constipation. 12 suppository 0  . carboxymethylcellulose (REFRESH PLUS) 0.5 % SOLN Place 1 drop into both eyes 3 (three) times daily as needed. Non-preservative    . cholecalciferol (VITAMIN D) 1000 UNITS tablet Take 5,000 Units by mouth daily.     . Continuous Blood Gluc Sensor ([place]) MISC by Does not apply route. Sensor is located in Right arm    . fish oil-omega-3 fatty acids 1000 MG capsule Take 2 g by mouth daily.    . hydrochlorothiazide (HYDRODIURIL) 25 MG tablet Take 1 tablet (25 mg total)  by mouth daily as needed (Swelling). 90 tablet 0  . hydrochlorothiazide (HYDRODIURIL) 25 MG tablet Take 12.5 mg by mouth daily.    . indomethacin (INDOCIN) 50 MG capsule Take 1 capsule (50 mg total) by mouth 3 (three) times daily with meals. 30 capsule 0  . Insulin Glargine (LANTUS SOLOSTAR) 100 UNIT/ML Solostar Pen Inject 14 Units into the skin daily at 10 pm.     . insulin lispro (HUMALOG KWIKPEN) 100 UNIT/ML KiwkPen Inject into the skin. Sliding scale before meal    . Insulin Pen Needle (BD PEN NEEDLE NANO 2ND GEN) 32G X 4 MM MISC INJECT AS DIRECTED FOUR TIMES A DAY    . linaclotide (LINZESS) 72 MCG capsule Take 1 capsule (72 mcg total) by mouth daily before breakfast. 30 capsule   .  losartan (COZAAR) 100 MG tablet Take 1 tablet (100 mg total) by mouth daily. 90 tablet 0  . metoprolol succinate (TOPROL XL) 25 MG 24 hr tablet Take 0.5 tablets (12.5 mg total) by mouth as needed. AS NEEDED FOR PALPITATIONS 30 tablet 11  . Multiple Vitamin (MULTIVITAMIN) tablet Take 1 tablet by mouth daily. For breast and bone health    . ondansetron (ZOFRAN) 4 MG tablet Take 1 tablet (4 mg total) by mouth every 8 (eight) hours as needed for nausea or vomiting. 20 tablet 0  . pantoprazole (PROTONIX) 40 MG tablet Take 1 tablet (40 mg total) by mouth daily. 30 tablet 5  . Peppermint Oil (IBGARD) 90 MG CPCR Take 1 capsule by mouth daily.    [name] saccharomyces boulardii (FLORASTOR) 250 MG capsule Take 1 capsule (250 mg total) by mouth 2 (two) times daily. 60 capsule 5  . simvastatin (ZOCOR) 20 MG tablet TAKE ONE TABLET AT BEDTIME. 90 tablet 1  . SYNTHROID 100 MCG tablet Take 1 tablet (100 mcg total) by mouth daily before breakfast.     No current facility-administered medications on file prior to visit.    Past Medical History:  Diagnosis Date  . Anxiety   . Arthritis    fingers  . Bowel obstruction ([place])   . Cancer of upper-outer quadrant of female breast ([place]) 08/21/2011   ER +  PR +  Her 2 -  Ki67 9%   0.9 cm invasive lobular  Carcinoma ,s/p central lumpectomy with sentinel node biopsy,  ER/PR positive s/p re-excion on 09/16/11 with final pathology showing atypical hyperplasia   . CAP (community acquired pneumonia) 12/06/2016  . Diabetes mellitus type 1 ([facility])    type I- wears insulin pump  . GERD (gastroesophageal reflux disease)   . History of small bowel obstruction   . Hypercholesteremia   . Hypergastrinemia   . Hypertension   . Hypertension   . Hyponatremia   . Hypothyroid   . Hypothyroidism   . IBS (irritable bowel syndrome)   . Osteoarthritis   . Osteoporosis, post-menopausal    DEXA 12/02/11: -3.5 (with lomax gyn), declines bisphos due to bone pain  . PAF (paroxysmal atrial  fibrillation) ([place])    One episode in 2008, spontaneously converted in the ED, no further treatment  . Spondylosis    thoracic spine  . Thyroid nodule dx 07/2011   [place] q 20moto follow calcified L thyroid nodule (12/08/11 [place]  . Umbilical hernia     Past Surgical History:  Procedure Laterality Date  . ABDOMINAL HYSTERECTOMY    . ABDOMINAL HYSTERECTOMY  yrs ago  . APPENDECTOMY    . APPENDECTOMY  age 86 .  BLADDER REPAIR  5 yrs ago  . BREAST LUMPECTOMY    . BREAST SURGERY     biopsy  . BREAST SURGERY   yrs ago   br bx  . BREAST SURGERY  01/ 23/2013   left central lumpectomy and snbx, re-excision lumpectomy  . CARDIOVASCULAR STRESS TEST  03/17/2012  . colon surgery     Small intestines- took 6 inches out  . COLONOSCOPY  07/11/2015   [facility] Dr Derrill Kay  . ESOPHAGOGASTRODUODENOSCOPY  07/17/2010   [facility]  . LEFT HEART CATHETERIZATION WITH CORONARY ANGIOGRAM N/A 03/18/2012   Procedure: LEFT HEART CATHETERIZATION WITH CORONARY ANGIOGRAM;  Surgeon: Minus Breeding, MD;  Location: [facility] CATH LAB;  Service: Cardiovascular;  Laterality: N/A;  . TONSILLECTOMY   age 70    Social History   Socioeconomic History  . Marital status: Married    Spouse name: Not on file  . Number of children: Not on file  . Years of education: Not on file  . Highest education level: Not on file  Occupational History  . Not on file  Tobacco Use  . Smoking status: Former Smoker    Packs/day: 1.00    Years: 10.00    Pack years: 10.00    Types: Cigarettes    Quit date: 08/27/1977    Years since quitting: 42.1  . Smokeless tobacco: Never Used  Substance and Sexual Activity  . Alcohol use: Yes    Comment: rare  . Drug use: No  . Sexual activity: Yes    Birth control/protection: Post-menopausal  Other Topics Concern  . Not on file  Social History Narrative   ** Merged History Encounter **       Social Determinants of Health   Financial Resource Strain:   . Difficulty of Paying Living Expenses:  Not on file  Food Insecurity:   . Worried About Charity fundraiser in the Last Year: Not on file  . Ran Out of Food in the Last Year: Not on file  Transportation Needs:   . Lack of Transportation (Medical): Not on file  . Lack of Transportation (Non-Medical): Not on file  Physical Activity:   . Days of Exercise per Week: Not on file  . Minutes of Exercise per Session: Not on file  Stress:   . Feeling of Stress : Not on file  Social Connections:   . Frequency of Communication with Friends and Family: Not on file  . Frequency of Social Gatherings with Friends and Family: Not on file  . Attends Religious Services: Not on file  . Active Member of Clubs or Organizations: Not on file  . Attends Archivist Meetings: Not on file  . Marital Status: Not on file    Family History  Problem Relation Age of Onset  . Cancer Sister        unknown  . Hypertension Mother   . Stroke Mother   . Arthritis Father   . Heart disease Father   . Diabetes Son   . Colon cancer Neg Hx   . Esophageal cancer Neg Hx     Review of Systems  Constitutional: Positive for fatigue. Negative for chills and fever.  Respiratory: Negative for cough, shortness of breath and wheezing.   Cardiovascular: Positive for chest pain (? from back). Negative for palpitations and leg swelling.  Gastrointestinal: Positive for abdominal distention, abdominal pain, constipation and diarrhea.  Musculoskeletal: Positive for back pain.  Neurological: Positive for headaches (occ). Negative for light-headedness.  Psychiatric/Behavioral: Positive for sleep disturbance (sleep is good).       Objective:   Vitals:   10/04/19 1308  BP: 128/82  Pulse: 82  Temp: 97.8 F (36.6 C)  SpO2: 96%   BP Readings from Last 3 Encounters:  10/04/19 128/82  08/18/19 (!) 166/84  08/16/19 (!) 168/84   Wt Readings from Last 3 Encounters:  10/04/19 136 lb 12.8 oz (62.1 kg)  08/18/19 138 lb 6.4 oz (62.8 kg)  08/16/19 137 lb 12.8  oz (62.5 kg)   Body mass index is 21.43 kg/m.   Physical Exam    Constitutional: Appears well-developed and well-nourished. No distress.  HENT:  Head: Normocephalic and atraumatic.  Neck: Neck supple. No tracheal deviation present. No thyromegaly present.  No cervical lymphadenopathy Cardiovascular: Normal rate, regular rhythm and normal heart sounds.   No murmur heard. No carotid bruit .  No edema Pulmonary/Chest: Effort normal and breath sounds normal. No respiratory distress. No has no wheezes. No rales.  Skin: Skin is warm and dry. Not diaphoretic.  Psychiatric: Normal mood and affect. Behavior is normal.      Assessment & Plan:    See Problem List for Assessment and Plan of chronic medical problems.    This visit occurred during the SARS-CoV-2 public health emergency.  Safety protocols were in place, including screening questions prior to the visit, additional usage of staff PPE, and extensive cleaning of exam room while observing appropriate contact time as indicated for disinfecting solutions.

## 2019-10-04 ENCOUNTER — Ambulatory Visit: Payer: Medicare PPO | Admitting: Internal Medicine

## 2019-10-04 ENCOUNTER — Other Ambulatory Visit: Payer: Self-pay

## 2019-10-04 ENCOUNTER — Encounter: Payer: Self-pay | Admitting: Internal Medicine

## 2019-10-04 VITALS — BP 128/82 | HR 82 | Temp 97.8°F | Ht 67.0 in | Wt 136.8 lb

## 2019-10-04 DIAGNOSIS — M546 Pain in thoracic spine: Secondary | ICD-10-CM | POA: Diagnosis not present

## 2019-10-04 DIAGNOSIS — E039 Hypothyroidism, unspecified: Secondary | ICD-10-CM | POA: Diagnosis not present

## 2019-10-04 DIAGNOSIS — E1043 Type 1 diabetes mellitus with diabetic autonomic (poly)neuropathy: Secondary | ICD-10-CM

## 2019-10-04 DIAGNOSIS — M810 Age-related osteoporosis without current pathological fracture: Secondary | ICD-10-CM

## 2019-10-04 DIAGNOSIS — R202 Paresthesia of skin: Secondary | ICD-10-CM | POA: Diagnosis not present

## 2019-10-04 DIAGNOSIS — R197 Diarrhea, unspecified: Secondary | ICD-10-CM | POA: Diagnosis not present

## 2019-10-04 DIAGNOSIS — I10 Essential (primary) hypertension: Secondary | ICD-10-CM | POA: Diagnosis not present

## 2019-10-04 DIAGNOSIS — G8929 Other chronic pain: Secondary | ICD-10-CM

## 2019-10-04 LAB — LIPID PANEL
Cholesterol: 169 mg/dL (ref 0–200)
HDL: 54.5 mg/dL (ref >39.00)
LDL Cholesterol: 95 mg/dL (ref 0–99)
NonHDL: 114.9
Total CHOL/HDL Ratio: 3
Triglycerides: 99 mg/dL (ref 0.0–149.0)
VLDL: 19.8 mg/dL (ref 0.0–40.0)

## 2019-10-04 LAB — CBC WITH DIFFERENTIAL/PLATELET
Basophils Absolute: 0 10*3/uL (ref 0.0–0.1)
Basophils Relative: 0.5 % (ref 0.0–3.0)
Eosinophils Absolute: 0.2 10*3/uL (ref 0.0–0.7)
Eosinophils Relative: 3.4 % (ref 0.0–5.0)
HCT: 37.4 % (ref 36.0–46.0)
Hemoglobin: 12.6 g/dL (ref 12.0–15.0)
Lymphocytes Relative: 27.5 % (ref 12.0–46.0)
Lymphs Abs: 2 10*3/uL (ref 0.7–4.0)
MCHC: 33.7 g/dL (ref 30.0–36.0)
MCV: 90.3 fl (ref 78.0–100.0)
Monocytes Absolute: 0.9 10*3/uL (ref 0.1–1.0)
Monocytes Relative: 12.1 % — ABNORMAL HIGH (ref 3.0–12.0)
Neutro Abs: 4 10*3/uL (ref 1.4–7.7)
Neutrophils Relative %: 56.5 % (ref 43.0–77.0)
Platelets: 245 10*3/uL (ref 150.0–400.0)
RBC: 4.14 Mil/uL (ref 3.87–5.11)
RDW: 12.9 % (ref 11.5–15.5)
WBC: 7.1 10*3/uL (ref 4.0–10.5)

## 2019-10-04 LAB — COMPREHENSIVE METABOLIC PANEL
ALT: 15 U/L (ref 0–35)
AST: 26 U/L (ref 0–37)
Albumin: 4.1 g/dL (ref 3.5–5.2)
Alkaline Phosphatase: 75 U/L (ref 39–117)
BUN: 14 mg/dL (ref 6–23)
CO2: 25 mEq/L (ref 19–32)
Calcium: 10.3 mg/dL (ref 8.4–10.5)
Chloride: 98 mEq/L (ref 96–112)
Creatinine, Ser: 0.77 mg/dL (ref 0.40–1.20)
GFR: 70.69 mL/min (ref >60.00)
Glucose, Bld: 128 mg/dL — ABNORMAL HIGH (ref 70–99)
Potassium: 3.9 mEq/L (ref 3.5–5.1)
Sodium: 132 mEq/L — ABNORMAL LOW (ref 135–145)
Total Bilirubin: 0.6 mg/dL (ref 0.2–1.2)
Total Protein: 7.8 g/dL (ref 6.0–8.3)

## 2019-10-04 LAB — VITAMIN B12: Vitamin B-12: 103 pg/mL — ABNORMAL LOW (ref 211–911)

## 2019-10-04 LAB — TSH: TSH: 41.86 u[IU]/mL — ABNORMAL HIGH (ref 0.35–4.50)

## 2019-10-04 LAB — VITAMIN D 25 HYDROXY (VIT D DEFICIENCY, FRACTURES): VITD: 55.58 ng/mL (ref 30.00–100.00)

## 2019-10-04 NOTE — Assessment & Plan Note (Signed)
Related to compression fx Wearing brace Tylenol prn Following with ortho

## 2019-10-04 NOTE — Assessment & Plan Note (Signed)
Chronic Deferred medication in the past dexa ordered She will consider medication but wants to get the dexa first Taking vitamin d, drinking more milk

## 2019-10-04 NOTE — Assessment & Plan Note (Signed)
Chronic  Having increased fatigue Check tsh  Titrate med dose if needed

## 2019-10-04 NOTE — Assessment & Plan Note (Signed)
Chronic Management per endo Will check a1c

## 2019-10-04 NOTE — Assessment & Plan Note (Signed)
B12 level 

## 2019-10-04 NOTE — Assessment & Plan Note (Signed)
Chronic BP well controlled Current regimen effective and well tolerated Continue current medications at current doses cmp  

## 2019-10-04 NOTE — Assessment & Plan Note (Signed)
Related to chronic constipation Continue miralax Restart metamucil Try to stop imodium

## 2019-10-04 NOTE — Patient Instructions (Signed)
  Blood work was ordered.  Your bone density was ordered.    Medications reviewed and updated.  Changes include :   Start metamucil daily.   Your prescription(s) have been submitted to your pharmacy. Please take as directed and contact our office if you believe you are having problem(s) with the medication(s).     Please followup in 6 months

## 2019-10-05 ENCOUNTER — Encounter: Payer: Self-pay | Admitting: Internal Medicine

## 2019-10-05 LAB — HEMOGLOBIN A1C: Hgb A1c MFr Bld: 7.4 % — ABNORMAL HIGH (ref 4.6–6.5)

## 2019-10-06 DIAGNOSIS — R0981 Nasal congestion: Secondary | ICD-10-CM | POA: Diagnosis not present

## 2019-10-06 DIAGNOSIS — Z20828 Contact with and (suspected) exposure to other viral communicable diseases: Secondary | ICD-10-CM | POA: Diagnosis not present

## 2019-10-06 DIAGNOSIS — R05 Cough: Secondary | ICD-10-CM | POA: Diagnosis not present

## 2019-10-06 MED ORDER — AZITHROMYCIN 250 MG PO TABS: ORAL_TABLET | ORAL | 0 refills | Status: DC

## 2019-10-08 NOTE — Progress Notes (Signed)
Patient Care Team: Binnie Rail, MD as PCP - General (Internal Medicine) Rolm Bookbinder, MD (General Surgery) Lonna Duval, MD (Endocrinology) Marica Otter, OD (Optometry)  DIAGNOSIS:    ICD-10-CM   1. Primary cancer of upper outer quadrant of left female breast ([place])  C50.412     SUMMARY OF ONCOLOGIC HISTORY: Oncology History  Primary cancer of upper outer quadrant of left female breast ([place])  09/02/2011 Surgery   Left lumpectomy: grade 1 lobular carcinoma measuring 0.9 cm with LCIS Medial and superior margins were focally positive, reexcision margins negative, 0/1 sentinel node, ER/PR positive HER-2 negative   09/25/2011 - 01/07/2012 Anti-estrogen oral therapy   Patient could not tolerate tamoxifen, also did not receive radiation at [facility] because of coronary artery disease and patient stopped after one radiation treatment     CHIEF COMPLIANT: Surveillance of left breast cancer  INTERVAL HISTORY: [name] is a 86 y.o. with above-mentioned history of left breast invasive lobular cancer treated with lumpectomy and who is currently on surveillance. She presents to the clinic alone today for annual follow-up.   ALLERGIES:  is allergic to eliquis [apixaban]; amlodipine; augmentin [amoxicillin-pot clavulanate]; cortisone; keflex [cephalexin]; omeprazole; prednisone; hydrocodone; and latex.  MEDICATIONS:  Current Outpatient Medications  Medication Sig Dispense Refill  . ALPRAZolam (XANAX) 0.5 MG tablet TAKE 1/2 TABLET TWICE DAILY AS NEEDED FOR ANXIETY OR SLEEP 30 tablet 0  . aspirin EC 81 MG tablet Take 81 mg by mouth daily.    [name] azithromycin (ZITHROMAX) 250 MG tablet Take two tabs the first day and then one tab daily for four days 6 tablet 0  . B Complex-C (SUPER B COMPLEX PO) Take 1 tablet by mouth daily.    . BD VEO INSULIN SYRINGE U/F 31G X 15/64" 0.3 ML MISC USE WITH INJECTIONS 100 each 1  . bisacodyl (DULCOLAX) 10 MG suppository Place 1  suppository (10 mg total) rectally as needed for moderate constipation. 12 suppository 0  . carboxymethylcellulose (REFRESH PLUS) 0.5 % SOLN Place 1 drop into both eyes 3 (three) times daily as needed. Non-preservative    . cholecalciferol (VITAMIN D) 1000 UNITS tablet Take 5,000 Units by mouth daily.     . Continuous Blood Gluc Sensor ([place]) MISC by Does not apply route. Sensor is located in Right arm    . fish oil-omega-3 fatty acids 1000 MG capsule Take 2 g by mouth daily.    . Insulin Glargine (LANTUS SOLOSTAR) 100 UNIT/ML Solostar Pen Inject 14 Units into the skin daily at 10 pm.     . insulin lispro (HUMALOG KWIKPEN) 100 UNIT/ML KiwkPen Inject into the skin. Sliding scale before meal    . Insulin Pen Needle (BD PEN NEEDLE NANO 2ND GEN) 32G X 4 MM MISC INJECT AS DIRECTED FOUR TIMES A DAY    . losartan (COZAAR) 100 MG tablet Take 1 tablet (100 mg total) by mouth daily. 90 tablet 0  . metoprolol succinate (TOPROL XL) 25 MG 24 hr tablet Take 0.5 tablets (12.5 mg total) by mouth as needed. AS NEEDED FOR PALPITATIONS 30 tablet 11  . Multiple Vitamin (MULTIVITAMIN) tablet Take 1 tablet by mouth daily. For breast and bone health    . Peppermint Oil (IBGARD) 90 MG CPCR Take 1 capsule by mouth daily.    [name] saccharomyces boulardii (FLORASTOR) 250 MG capsule Take 1 capsule (250 mg total) by mouth 2 (two) times daily. 60 capsule 5  . simvastatin (ZOCOR) 20 MG tablet TAKE  ONE TABLET AT BEDTIME. 90 tablet 1  . SYNTHROID 100 MCG tablet Take 1 tablet (100 mcg total) by mouth daily before breakfast.     No current facility-administered medications for this visit.    PHYSICAL EXAMINATION: ECOG PERFORMANCE STATUS: 1 - Symptomatic but completely ambulatory  Vitals:   10/09/19 1348  BP: (!) 154/87  Pulse: 88  Resp: 16  Temp: 98.7 F (37.1 C)  SpO2: 93%   Filed Weights   10/09/19 1348  Weight: 138 lb 8 oz (62.8 kg)    BREAST: No palpable masses or nodules in either right or  left breasts. No palpable axillary supraclavicular or infraclavicular adenopathy no breast tenderness or nipple discharge. (exam performed in the presence of a chaperone)  LABORATORY DATA:  I have reviewed the data as listed CMP Latest Ref Rng & Units 10/04/2019 04/14/2019 04/10/2019  Glucose 70 - 99 mg/dL 128(H) 92 -  BUN 6 - 23 mg/dL 14 8 -  Creatinine 0.40 - 1.20 mg/dL 0.77 0.72 0.70  Sodium 135 - 145 mEq/L 132(L) 133(L) -  Potassium 3.5 - 5.1 mEq/L 3.9 3.7 -  Chloride 96 - 112 mEq/L 98 99 -  CO2 19 - 32 mEq/L 25 27 -  Calcium 8.4 - 10.5 mg/dL 10.3 9.8 -  Total Protein 6.0 - 8.3 g/dL 7.8 7.6 -  Total Bilirubin 0.2 - 1.2 mg/dL 0.6 0.5 -  Alkaline Phos 39 - 117 U/L 75 74 -  AST 0 - 37 U/L 26 24 -  ALT 0 - 35 U/L 15 12 -    Lab Results  Component Value Date   WBC 7.1 10/04/2019   HGB 12.6 10/04/2019   HCT 37.4 10/04/2019   MCV 90.3 10/04/2019   PLT 245.0 10/04/2019   NEUTROABS 4.0 10/04/2019    ASSESSMENT & PLAN:  Primary cancer of upper outer quadrant of left female breast ([place]) history of StageIAleft breastinvasive lobular carcinoma, ER+/PR+/HER2-, diagnosed in1/2013, treated with lumpectomy,unable to tolerate Tamoxifen, and declined adjuvant radiation  Breast Cancer Surveillance: 1. Breast exam  10/09/2019: Benign 2. Mammogram 1/3/2020No abnormalities. Postsurgical changes. Breast Density Category C.  Patient needs another mammogram this year.  Chronic back pain: Sees orthopedics Hypothyroidism: Sees endocrinology Patient wishes to follow-up with me once a year for a breast exam and follow-up.  RTC in 1 year  No orders of the defined types were placed in this encounter.  The patient has a good understanding of the overall plan. she agrees with it. she will call with any problems that may develop before the next visit here.  Total time spent: 20 mins including face to face time and time spent for planning, charting and coordination of care  Nicholas Lose,  MD 10/09/2019  I, Cloyde Reams Dorshimer, am acting as scribe for Dr. Nicholas Lose.  I have reviewed the above documentation for accuracy and completeness, and I agree with the above.

## 2019-10-09 ENCOUNTER — Inpatient Hospital Stay: Payer: Medicare PPO | Attending: Hematology and Oncology | Admitting: Hematology and Oncology

## 2019-10-09 ENCOUNTER — Other Ambulatory Visit: Payer: Self-pay | Admitting: Internal Medicine

## 2019-10-09 ENCOUNTER — Telehealth: Payer: Self-pay | Admitting: Internal Medicine

## 2019-10-09 ENCOUNTER — Other Ambulatory Visit: Payer: Self-pay

## 2019-10-09 DIAGNOSIS — Z7982 Long term (current) use of aspirin: Secondary | ICD-10-CM | POA: Insufficient documentation

## 2019-10-09 DIAGNOSIS — Z79899 Other long term (current) drug therapy: Secondary | ICD-10-CM | POA: Insufficient documentation

## 2019-10-09 DIAGNOSIS — G8929 Other chronic pain: Secondary | ICD-10-CM | POA: Diagnosis not present

## 2019-10-09 DIAGNOSIS — Z170 Estrogen receptor positive status [ER+]: Secondary | ICD-10-CM | POA: Insufficient documentation

## 2019-10-09 DIAGNOSIS — Z794 Long term (current) use of insulin: Secondary | ICD-10-CM | POA: Insufficient documentation

## 2019-10-09 DIAGNOSIS — M549 Dorsalgia, unspecified: Secondary | ICD-10-CM | POA: Insufficient documentation

## 2019-10-09 DIAGNOSIS — E039 Hypothyroidism, unspecified: Secondary | ICD-10-CM | POA: Insufficient documentation

## 2019-10-09 DIAGNOSIS — C50412 Malignant neoplasm of upper-outer quadrant of left female breast: Secondary | ICD-10-CM | POA: Diagnosis not present

## 2019-10-09 MED ORDER — LEVOTHYROXINE SODIUM 125 MCG PO TABS: 125.0000 ug | ORAL_TABLET | Freq: Every day | ORAL | 1 refills | Status: DC

## 2019-10-09 NOTE — Addendum Note (Signed)
Addended by: Delice Bison E on: 10/09/2019 04:44 PM   Modules accepted: Orders

## 2019-10-09 NOTE — Assessment & Plan Note (Signed)
history of StageIAleft breastinvasive lobular carcinoma, ER+/PR+/HER2-, diagnosed in1/2013, treated with lumpectomy,unable to tolerate Tamoxifen, and declined adjuvant radiation  Breast Cancer Surveillance: 1. Breast exam  10/09/2019: Benign 2. Mammogram 1/3/2020No abnormalities. Postsurgical changes. Breast Density Category C.  Patient needs another mammogram this year.    Patient wishes to follow-up with me once a year for a breast exam and follow-up.  RTC in 1 year

## 2019-10-09 NOTE — Telephone Encounter (Signed)
New message:   Pt is calling and states she would like to go over her test results. She stated she received a message from Dr. Quay Burow and stated she would be called today. She also wanted to tell the doctor that her Covid test was Negative.Please advise

## 2019-10-09 NOTE — Telephone Encounter (Signed)
Pt aware of results 

## 2019-10-10 ENCOUNTER — Telehealth: Payer: Self-pay | Admitting: Hematology and Oncology

## 2019-10-10 NOTE — Telephone Encounter (Signed)
I talk with patient regarding schedule  

## 2019-10-13 ENCOUNTER — Other Ambulatory Visit: Payer: Self-pay | Admitting: Hematology and Oncology

## 2019-10-13 DIAGNOSIS — Z1231 Encounter for screening mammogram for malignant neoplasm of breast: Secondary | ICD-10-CM

## 2019-10-18 ENCOUNTER — Other Ambulatory Visit: Payer: Self-pay | Admitting: Internal Medicine

## 2019-10-18 DIAGNOSIS — F411 Generalized anxiety disorder: Secondary | ICD-10-CM

## 2019-10-19 ENCOUNTER — Other Ambulatory Visit: Payer: Self-pay

## 2019-10-19 ENCOUNTER — Ambulatory Visit (INDEPENDENT_AMBULATORY_CARE_PROVIDER_SITE_OTHER): Payer: Medicare PPO | Admitting: *Deleted

## 2019-10-19 DIAGNOSIS — E538 Deficiency of other specified B group vitamins: Secondary | ICD-10-CM | POA: Diagnosis not present

## 2019-10-19 DIAGNOSIS — M4854XD Collapsed vertebra, not elsewhere classified, thoracic region, subsequent encounter for fracture with routine healing: Secondary | ICD-10-CM | POA: Diagnosis not present

## 2019-10-19 MED ORDER — ALPRAZOLAM 0.5 MG PO TABS: ORAL_TABLET | ORAL | 0 refills | Status: DC

## 2019-10-19 MED ORDER — CYANOCOBALAMIN 1000 MCG/ML IJ SOLN
1000.0000 ug | Freq: Once | INTRAMUSCULAR | Status: AC
  Administered 2019-10-19: 15:00:00 1000 ug via INTRAMUSCULAR

## 2019-10-19 NOTE — Telephone Encounter (Signed)
Check [place] registry last filled 09/14/2019.Marland KitchenJohny Foster

## 2019-10-19 NOTE — Progress Notes (Signed)
Pls cosign for B12 since PCP is out of the office this afternoon

## 2019-10-23 DIAGNOSIS — E1042 Type 1 diabetes mellitus with diabetic polyneuropathy: Secondary | ICD-10-CM | POA: Diagnosis not present

## 2019-10-23 DIAGNOSIS — E063 Autoimmune thyroiditis: Secondary | ICD-10-CM | POA: Diagnosis not present

## 2019-10-23 DIAGNOSIS — E038 Other specified hypothyroidism: Secondary | ICD-10-CM | POA: Diagnosis not present

## 2019-10-23 DIAGNOSIS — E785 Hyperlipidemia, unspecified: Secondary | ICD-10-CM | POA: Diagnosis not present

## 2019-10-23 DIAGNOSIS — E1069 Type 1 diabetes mellitus with other specified complication: Secondary | ICD-10-CM | POA: Diagnosis not present

## 2019-10-26 ENCOUNTER — Ambulatory Visit (INDEPENDENT_AMBULATORY_CARE_PROVIDER_SITE_OTHER): Payer: Medicare PPO | Admitting: *Deleted

## 2019-10-26 ENCOUNTER — Other Ambulatory Visit: Payer: Self-pay

## 2019-10-26 DIAGNOSIS — E538 Deficiency of other specified B group vitamins: Secondary | ICD-10-CM

## 2019-10-26 MED ORDER — CYANOCOBALAMIN 1000 MCG/ML IJ SOLN
1000.0000 ug | Freq: Once | INTRAMUSCULAR | Status: AC
  Administered 2019-10-26: 1000 ug via INTRAMUSCULAR

## 2019-10-26 NOTE — Progress Notes (Addendum)
Pls sign for B12 inj.. since PCP is gone for today..[name]  b12 Injection given.   Binnie Rail, MD

## 2019-10-30 DIAGNOSIS — R7989 Other specified abnormal findings of blood chemistry: Secondary | ICD-10-CM | POA: Diagnosis not present

## 2019-10-30 DIAGNOSIS — E1042 Type 1 diabetes mellitus with diabetic polyneuropathy: Secondary | ICD-10-CM | POA: Diagnosis not present

## 2019-10-31 ENCOUNTER — Telehealth: Payer: Self-pay | Admitting: Internal Medicine

## 2019-10-31 NOTE — Telephone Encounter (Signed)
Patient is asking if it is ok to take the B12 shot and the COVID vaccine in the same day within a couple of hours of each other. Please advise.

## 2019-10-31 NOTE — Telephone Encounter (Signed)
Pt aware of response.  

## 2019-10-31 NOTE — Telephone Encounter (Signed)
That should be okay.  The CDC only warnings against other vaccinations within 2 weeks, not B12 injections.

## 2019-10-31 NOTE — Telephone Encounter (Signed)
New message:   Pt is calling and states she has some questions about the vaccination and the B12 shot she is due to take on the 25th.

## 2019-11-01 DIAGNOSIS — M791 Myalgia, unspecified site: Secondary | ICD-10-CM | POA: Diagnosis not present

## 2019-11-01 DIAGNOSIS — R0602 Shortness of breath: Secondary | ICD-10-CM | POA: Diagnosis not present

## 2019-11-01 DIAGNOSIS — R05 Cough: Secondary | ICD-10-CM | POA: Diagnosis not present

## 2019-11-01 DIAGNOSIS — J42 Unspecified chronic bronchitis: Secondary | ICD-10-CM | POA: Diagnosis not present

## 2019-11-01 DIAGNOSIS — R0789 Other chest pain: Secondary | ICD-10-CM | POA: Diagnosis not present

## 2019-11-01 DIAGNOSIS — J40 Bronchitis, not specified as acute or chronic: Secondary | ICD-10-CM | POA: Diagnosis not present

## 2019-11-01 DIAGNOSIS — R519 Headache, unspecified: Secondary | ICD-10-CM | POA: Diagnosis not present

## 2019-11-01 DIAGNOSIS — R070 Pain in throat: Secondary | ICD-10-CM | POA: Diagnosis not present

## 2019-11-01 DIAGNOSIS — Z20828 Contact with and (suspected) exposure to other viral communicable diseases: Secondary | ICD-10-CM | POA: Diagnosis not present

## 2019-11-02 ENCOUNTER — Ambulatory Visit: Payer: Medicare PPO

## 2019-11-09 ENCOUNTER — Other Ambulatory Visit: Payer: Self-pay

## 2019-11-09 ENCOUNTER — Ambulatory Visit (INDEPENDENT_AMBULATORY_CARE_PROVIDER_SITE_OTHER): Payer: Medicare PPO | Admitting: *Deleted

## 2019-11-09 DIAGNOSIS — E538 Deficiency of other specified B group vitamins: Secondary | ICD-10-CM

## 2019-11-09 DIAGNOSIS — M4854XD Collapsed vertebra, not elsewhere classified, thoracic region, subsequent encounter for fracture with routine healing: Secondary | ICD-10-CM | POA: Diagnosis not present

## 2019-11-09 DIAGNOSIS — E109 Type 1 diabetes mellitus without complications: Secondary | ICD-10-CM | POA: Diagnosis not present

## 2019-11-09 MED ORDER — CYANOCOBALAMIN 1000 MCG/ML IJ SOLN
1000.0000 ug | Freq: Once | INTRAMUSCULAR | Status: AC
  Administered 2019-11-09: 1000 ug via INTRAMUSCULAR

## 2019-11-09 NOTE — Progress Notes (Addendum)
I have reviewed and agree.

## 2019-11-15 ENCOUNTER — Encounter: Payer: Self-pay | Admitting: Internal Medicine

## 2019-11-16 DIAGNOSIS — E109 Type 1 diabetes mellitus without complications: Secondary | ICD-10-CM | POA: Diagnosis not present

## 2019-11-17 NOTE — Telephone Encounter (Signed)
Called pt made appt for B12 12/11/19 & 6 month f/u w/MD 03/26/20.Marland KitchenJohny Foster

## 2019-11-20 DIAGNOSIS — R293 Abnormal posture: Secondary | ICD-10-CM | POA: Diagnosis not present

## 2019-11-20 DIAGNOSIS — M6281 Muscle weakness (generalized): Secondary | ICD-10-CM | POA: Diagnosis not present

## 2019-11-20 DIAGNOSIS — R2689 Other abnormalities of gait and mobility: Secondary | ICD-10-CM | POA: Diagnosis not present

## 2019-11-20 DIAGNOSIS — M2569 Stiffness of other specified joint, not elsewhere classified: Secondary | ICD-10-CM | POA: Diagnosis not present

## 2019-11-20 DIAGNOSIS — R2681 Unsteadiness on feet: Secondary | ICD-10-CM | POA: Diagnosis not present

## 2019-11-20 DIAGNOSIS — M5489 Other dorsalgia: Secondary | ICD-10-CM | POA: Diagnosis not present

## 2019-11-20 DIAGNOSIS — M4854XD Collapsed vertebra, not elsewhere classified, thoracic region, subsequent encounter for fracture with routine healing: Secondary | ICD-10-CM | POA: Diagnosis not present

## 2019-11-22 ENCOUNTER — Other Ambulatory Visit: Payer: Self-pay | Admitting: Internal Medicine

## 2019-11-22 DIAGNOSIS — F411 Generalized anxiety disorder: Secondary | ICD-10-CM

## 2019-11-22 NOTE — Telephone Encounter (Signed)
Last OV 10/04/19 Next OV 03/26/20 Last RF 10/19/19

## 2019-11-23 DIAGNOSIS — M6281 Muscle weakness (generalized): Secondary | ICD-10-CM | POA: Diagnosis not present

## 2019-11-23 DIAGNOSIS — M5489 Other dorsalgia: Secondary | ICD-10-CM | POA: Diagnosis not present

## 2019-11-23 DIAGNOSIS — M2569 Stiffness of other specified joint, not elsewhere classified: Secondary | ICD-10-CM | POA: Diagnosis not present

## 2019-11-23 DIAGNOSIS — R2689 Other abnormalities of gait and mobility: Secondary | ICD-10-CM | POA: Diagnosis not present

## 2019-11-23 DIAGNOSIS — M4854XD Collapsed vertebra, not elsewhere classified, thoracic region, subsequent encounter for fracture with routine healing: Secondary | ICD-10-CM | POA: Diagnosis not present

## 2019-11-23 DIAGNOSIS — R293 Abnormal posture: Secondary | ICD-10-CM | POA: Diagnosis not present

## 2019-11-23 DIAGNOSIS — R2681 Unsteadiness on feet: Secondary | ICD-10-CM | POA: Diagnosis not present

## 2019-11-28 DIAGNOSIS — R2689 Other abnormalities of gait and mobility: Secondary | ICD-10-CM | POA: Diagnosis not present

## 2019-11-28 DIAGNOSIS — M4854XD Collapsed vertebra, not elsewhere classified, thoracic region, subsequent encounter for fracture with routine healing: Secondary | ICD-10-CM | POA: Diagnosis not present

## 2019-11-28 DIAGNOSIS — M5489 Other dorsalgia: Secondary | ICD-10-CM | POA: Diagnosis not present

## 2019-11-28 DIAGNOSIS — M2569 Stiffness of other specified joint, not elsewhere classified: Secondary | ICD-10-CM | POA: Diagnosis not present

## 2019-11-28 DIAGNOSIS — M6281 Muscle weakness (generalized): Secondary | ICD-10-CM | POA: Diagnosis not present

## 2019-11-28 DIAGNOSIS — R293 Abnormal posture: Secondary | ICD-10-CM | POA: Diagnosis not present

## 2019-11-28 DIAGNOSIS — R2681 Unsteadiness on feet: Secondary | ICD-10-CM | POA: Diagnosis not present

## 2019-12-01 DIAGNOSIS — M2569 Stiffness of other specified joint, not elsewhere classified: Secondary | ICD-10-CM | POA: Diagnosis not present

## 2019-12-01 DIAGNOSIS — R2689 Other abnormalities of gait and mobility: Secondary | ICD-10-CM | POA: Diagnosis not present

## 2019-12-01 DIAGNOSIS — M6281 Muscle weakness (generalized): Secondary | ICD-10-CM | POA: Diagnosis not present

## 2019-12-01 DIAGNOSIS — R2681 Unsteadiness on feet: Secondary | ICD-10-CM | POA: Diagnosis not present

## 2019-12-01 DIAGNOSIS — M4854XD Collapsed vertebra, not elsewhere classified, thoracic region, subsequent encounter for fracture with routine healing: Secondary | ICD-10-CM | POA: Diagnosis not present

## 2019-12-01 DIAGNOSIS — R293 Abnormal posture: Secondary | ICD-10-CM | POA: Diagnosis not present

## 2019-12-01 DIAGNOSIS — M5489 Other dorsalgia: Secondary | ICD-10-CM | POA: Diagnosis not present

## 2019-12-05 DIAGNOSIS — M5489 Other dorsalgia: Secondary | ICD-10-CM | POA: Diagnosis not present

## 2019-12-05 DIAGNOSIS — M2569 Stiffness of other specified joint, not elsewhere classified: Secondary | ICD-10-CM | POA: Diagnosis not present

## 2019-12-05 DIAGNOSIS — M6281 Muscle weakness (generalized): Secondary | ICD-10-CM | POA: Diagnosis not present

## 2019-12-05 DIAGNOSIS — R2681 Unsteadiness on feet: Secondary | ICD-10-CM | POA: Diagnosis not present

## 2019-12-05 DIAGNOSIS — R2689 Other abnormalities of gait and mobility: Secondary | ICD-10-CM | POA: Diagnosis not present

## 2019-12-05 DIAGNOSIS — M4854XD Collapsed vertebra, not elsewhere classified, thoracic region, subsequent encounter for fracture with routine healing: Secondary | ICD-10-CM | POA: Diagnosis not present

## 2019-12-05 DIAGNOSIS — R293 Abnormal posture: Secondary | ICD-10-CM | POA: Diagnosis not present

## 2019-12-06 ENCOUNTER — Other Ambulatory Visit: Payer: Self-pay

## 2019-12-06 ENCOUNTER — Encounter: Payer: Self-pay | Admitting: Internal Medicine

## 2019-12-06 ENCOUNTER — Ambulatory Visit: Payer: Medicare PPO | Admitting: Internal Medicine

## 2019-12-06 VITALS — BP 154/72 | HR 84 | Ht 67.0 in | Wt 136.0 lb

## 2019-12-06 DIAGNOSIS — I480 Paroxysmal atrial fibrillation: Secondary | ICD-10-CM

## 2019-12-06 DIAGNOSIS — I10 Essential (primary) hypertension: Secondary | ICD-10-CM | POA: Diagnosis not present

## 2019-12-06 NOTE — Progress Notes (Signed)
HPI [name] returns today for followup. She is a pleasant 86 yo woman with a h/o breast CA, PAF, who has not been willing to take systemic anti-coagulation in the past due to bleeding. In the interim, she has had a repeat carotid u/s which demonstrates less than 39% carotid stenosis Allergies  Allergen Reactions  . Eliquis [Apixaban] Other (See Comments)    Dizziness, severe headach  . Amlodipine     Dizzy, nausea  . Augmentin [Amoxicillin-Pot Clavulanate] Other (See Comments)    Gi upset only no rash or hives   . Cortisone Other (See Comments)    Makes blood sugars run high  . Keflex [Cephalexin] Hives and Nausea Only    Stomach upset  . Omeprazole Nausea Only    Dizziness and nausea  . Prednisone     Other reaction(s): Other Makes blood sugars run high  . Hydrocodone Rash    Rash to chest , side back  . Latex Rash    Powder in gloves causes rash; "not allergic to latex just the powder"     Current Outpatient Medications  Medication Sig Dispense Refill  . ALPRAZolam (XANAX) 0.5 MG tablet TAKE 1/2 TABLET TWICE DAILY AS NEEDED FOR ANXIETY OR SLEEP 30 tablet 0  . aspirin EC 81 MG tablet Take 81 mg by mouth daily.    [name] azithromycin (ZITHROMAX) 250 MG tablet Take two tabs the first day and then one tab daily for four days 6 tablet 0  . B Complex-C (SUPER B COMPLEX PO) Take 1 tablet by mouth daily.    . BD VEO INSULIN SYRINGE U/F 31G X 15/64" 0.3 ML MISC USE WITH INJECTIONS 100 each 1  . bisacodyl (DULCOLAX) 10 MG suppository Place 1 suppository (10 mg total) rectally as needed for moderate constipation. 12 suppository 0  . carboxymethylcellulose (REFRESH PLUS) 0.5 % SOLN Place 1 drop into both eyes 3 (three) times daily as needed. Non-preservative    . cholecalciferol (VITAMIN D) 1000 UNITS tablet Take 5,000 Units by mouth daily.     . Continuous Blood Gluc Sensor ([place]) MISC by Does not apply route. Sensor is located in Right arm    . fish  oil-omega-3 fatty acids 1000 MG capsule Take 2 g by mouth daily.    . Insulin Glargine (LANTUS SOLOSTAR) 100 UNIT/ML Solostar Pen Inject 14 Units into the skin daily at 10 pm.     . insulin lispro (HUMALOG KWIKPEN) 100 UNIT/ML KiwkPen Inject into the skin. Sliding scale before meal    . Insulin Pen Needle (BD PEN NEEDLE NANO 2ND GEN) 32G X 4 MM MISC INJECT AS DIRECTED FOUR TIMES A DAY    . losartan (COZAAR) 100 MG tablet Take 1 tablet (100 mg total) by mouth daily. 90 tablet 0  . metoprolol succinate (TOPROL XL) 25 MG 24 hr tablet Take 0.5 tablets (12.5 mg total) by mouth as needed. AS NEEDED FOR PALPITATIONS 30 tablet 11  . Multiple Vitamin (MULTIVITAMIN) tablet Take 1 tablet by mouth daily. For breast and bone health    . saccharomyces boulardii (FLORASTOR) 250 MG capsule Take 1 capsule (250 mg total) by mouth 2 (two) times daily. 60 capsule 5  . simvastatin (ZOCOR) 20 MG tablet TAKE ONE TABLET AT BEDTIME. 90 tablet 1  . SYNTHROID 100 MCG tablet Take 1 tablet (100 mcg total) by mouth daily before breakfast.     No current facility-administered medications for this visit.  Past Medical History:  Diagnosis Date  . Anxiety   . Arthritis    fingers  . Bowel obstruction ([place])   . Cancer of upper-outer quadrant of female breast ([place]) 08/21/2011   ER +  PR +  Her 2 -  Ki67 9%   0.9 cm invasive lobular  Carcinoma ,s/p central lumpectomy with sentinel node biopsy,  ER/PR positive s/p re-excion on 09/16/11 with final pathology showing atypical hyperplasia   . CAP (community acquired pneumonia) 12/06/2016  . Diabetes mellitus type 1 (HCC)    type I- wears insulin pump  . GERD (gastroesophageal reflux disease)   . History of small bowel obstruction   . Hypercholesteremia   . Hypergastrinemia   . Hypertension   . Hypertension   . Hyponatremia   . Hypothyroid   . Hypothyroidism   . IBS (irritable bowel syndrome)   . Osteoarthritis   . Osteoporosis, post-menopausal    DEXA 12/02/11: -3.5  (with lomax gyn), declines bisphos due to bone pain  . PAF (paroxysmal atrial fibrillation) ([place])    One episode in 2008, spontaneously converted in the ED, no further treatment  . Spondylosis    thoracic spine  . Thyroid nodule dx 07/2011   [place] q 24moto follow calcified L thyroid nodule (12/08/11 [place]  . Umbilical hernia     ROS:   All systems reviewed and negative except as noted in the HPI.   Past Surgical History:  Procedure Laterality Date  . ABDOMINAL HYSTERECTOMY    . ABDOMINAL HYSTERECTOMY  yrs ago  . APPENDECTOMY    . APPENDECTOMY  age 86 . BLADDER REPAIR  5 yrs ago  . BREAST LUMPECTOMY    . BREAST SURGERY     biopsy  . BREAST SURGERY   yrs ago   br bx  . BREAST SURGERY  01/ 23/2013   left central lumpectomy and snbx, re-excision lumpectomy  . CARDIOVASCULAR STRESS TEST  03/17/2012  . colon surgery     Small intestines- took 6 inches out  . COLONOSCOPY  07/11/2015   [facility] Dr KDerrill Kay . ESOPHAGOGASTRODUODENOSCOPY  07/17/2010   [facility]  . LEFT HEART CATHETERIZATION WITH CORONARY ANGIOGRAM N/A 03/18/2012   Procedure: LEFT HEART CATHETERIZATION WITH CORONARY ANGIOGRAM;  Surgeon: JMinus Breeding MD;  [facility];  Service: Cardiovascular;  Laterality: N/A;  . TONSILLECTOMY   age 86    Family History  Problem Relation Age of Onset  . Cancer Sister        unknown  . Hypertension Mother   . Stroke Mother   . Arthritis Father   . Heart disease Father   . Diabetes Son   . Colon cancer Neg Hx   . Esophageal cancer Neg Hx      Social History   Socioeconomic History  . Marital status: Married    Spouse name: Not on file  . Number of children: Not on file  . Years of education: Not on file  . Highest education level: Not on file  Occupational History  . Not on file  Tobacco Use  . Smoking status: Former Smoker    Packs/day: 1.00    Years: 10.00    Pack years: 10.00    Types: Cigarettes    Quit date: 08/27/1977    Years since quitting:  42.3  . Smokeless tobacco: Never Used  Substance and Sexual Activity  . Alcohol use: Yes    Comment: rare  . Drug use: No  .  Sexual activity: Yes    Birth control/protection: Post-menopausal  Other Topics Concern  . Not on file  Social History Narrative   ** Merged History Encounter **       Social Determinants of Health   Financial Resource Strain:   . Difficulty of Paying Living Expenses:   Food Insecurity:   . Worried About Charity fundraiser in the Last Year:   . Arboriculturist in the Last Year:   Transportation Needs:   . Film/video editor (Medical):   [name] Lack of Transportation (Non-Medical):   Physical Activity:   . Days of Exercise per Week:   . Minutes of Exercise per Session:   Stress:   . Feeling of Stress :   Social Connections:   . Frequency of Communication with Friends and Family:   . Frequency of Social Gatherings with Friends and Family:   . Attends Religious Services:   . Active Member of Clubs or Organizations:   . Attends Archivist Meetings:   [name] Marital Status:   Intimate Partner Violence:   . Fear of Current or Ex-Partner:   . Emotionally Abused:   [name] Physically Abused:   . Sexually Abused:      BP (!) 154/72   Pulse 84   Ht '5\' 7"'$  (1.702 m)   Wt 136 lb (61.7 kg)   SpO2 94%   BMI 21.30 kg/m   Physical Exam:  Well appearing NAD HEENT: Unremarkable Neck:  No JVD, no thyromegally Lymphatics:  No adenopathy Back:  No CVA tenderness Lungs:  Clear with no wheezes HEART:  Regular rate rhythm, no murmurs, no rubs, no clicks Abd:  soft, positive bowel sounds, no organomegally, no rebound, no guarding Ext:  2 plus pulses, no edema, no cyanosis, no clubbing Skin:  No rashes no nodules Neuro:  CN II through XII intact, motor grossly intact  EKG - nsr at 84/min   Assess/Plan: 1. HTN - her sbp is minimally elevated.  2. PAF - She has had minimal symptoms and has refused systemic anti-coagulation.  3. Carotid vascular disease  - her carotid u/s demonstrates minimal bilateral plaquing. She will continue ASA therapy. She will continue simvastatin.  Mikle Bosworth.D.

## 2019-12-06 NOTE — Patient Instructions (Signed)

## 2019-12-07 ENCOUNTER — Telehealth: Payer: Self-pay

## 2019-12-07 NOTE — Telephone Encounter (Signed)
New message   The patient calls wanted the CMA to know why she canceling out her appt for a B12 shot, due to her husband's upcoming heart surgery.  R/s to  6.2.2021

## 2019-12-08 NOTE — Telephone Encounter (Signed)
Noted  

## 2019-12-11 ENCOUNTER — Ambulatory Visit: Payer: Medicare PPO

## 2019-12-14 ENCOUNTER — Telehealth: Payer: Self-pay | Admitting: Internal Medicine

## 2019-12-14 DIAGNOSIS — H53009 Unspecified amblyopia, unspecified eye: Secondary | ICD-10-CM | POA: Diagnosis not present

## 2019-12-14 DIAGNOSIS — H2511 Age-related nuclear cataract, right eye: Secondary | ICD-10-CM | POA: Diagnosis not present

## 2019-12-14 DIAGNOSIS — H3530 Unspecified macular degeneration: Secondary | ICD-10-CM | POA: Diagnosis not present

## 2019-12-14 NOTE — Telephone Encounter (Signed)
New message:   Pt is calling and states she would like to come by and do a urine test. She thinks she has a uti. Please advise.

## 2019-12-14 NOTE — Telephone Encounter (Signed)
Can you see if pt would be willing to be added on the scheduled tomorrow morning please

## 2019-12-14 NOTE — Progress Notes (Signed)
Subjective:    Patient ID: [name], female    DOB: [birth year], 86 y.o.   MRN: [id number]  HPI The patient is here for an acute visit.   ? UTI:  Her symptoms started just over one week ago.  She states achy feeling in urethra, urinary frequency, urinary urgency, abnormal color to urine, back pain, and decreased appetite and mild nausea.  She denies dysuria, hematuria, abdominal pain, fever.  Hypothyroidism:  She is taking her medication daily.  She denies any recent changes in energy or weight that are unexplained.  She is due to repeat tsh after adjustment two months ago.   B12 def:  She has gotten B12 injections but did miss one.  She did feel like the injection helped her feel better.    Medications and allergies reviewed with patient and updated if appropriate.  Patient Active Problem List   Diagnosis Date Noted  . Tingling in extremities 10/04/2019  . Acute bronchitis 09/20/2019  . Urinary frequency 08/16/2019  . Chronic thoracic back pain 08/16/2019  . Nausea 10/04/2018  . Medication side effects, initial encounter 02/01/2018  . Atrial fibrillation ([place]) 02/01/2018  . Leg cramps 07/05/2017  . Constipation 01/25/2017  . Closed fracture of proximal end of right humerus 07/09/2016  . Cough 06/17/2016  . Abdominal pain 03/10/2016  . Hearing loss 01/03/2016  . Allergic rhinitis 01/03/2016  . Minimal Coronary Plaque by Cardiac Cath in 2013 09/11/2015  . Palpitations 09/06/2015  . Frequent loose stools 08/28/2015  . Hyponatremia   . Acute mesenteric ischemia ([place])   . Hypothyroidism 07/11/2012  . Syncope 03/18/2012  . Abnormal LFTs   . Hypercholesteremia   . Hypertension   . Diabetes mellitus type 1 ([place])   . Osteoarthritis   . Thyroid nodule   . Osteoporosis, post-menopausal   . Primary cancer of upper outer quadrant of left female breast ([place]) 08/21/2011    Current Outpatient Medications on File Prior to Visit  Medication Sig Dispense Refill  .  ALPRAZolam (XANAX) 0.5 MG tablet TAKE 1/2 TABLET TWICE DAILY AS NEEDED FOR ANXIETY OR SLEEP 30 tablet 0  . aspirin EC 81 MG tablet Take 81 mg by mouth daily.    [name] azithromycin (ZITHROMAX) 250 MG tablet Take two tabs the first day and then one tab daily for four days 6 tablet 0  . B Complex-C (SUPER B COMPLEX PO) Take 1 tablet by mouth daily.    . BD VEO INSULIN SYRINGE U/F 31G X 15/64" 0.3 ML MISC USE WITH INJECTIONS 100 each 1  . bisacodyl (DULCOLAX) 10 MG suppository Place 1 suppository (10 mg total) rectally as needed for moderate constipation. 12 suppository 0  . carboxymethylcellulose (REFRESH PLUS) 0.5 % SOLN Place 1 drop into both eyes 3 (three) times daily as needed. Non-preservative    . cholecalciferol (VITAMIN D) 1000 UNITS tablet Take 5,000 Units by mouth daily.     . Continuous Blood Gluc Sensor ([place]) MISC by Does not apply route. Sensor is located in Right arm    . fish oil-omega-3 fatty acids 1000 MG capsule Take 2 g by mouth daily.    . Insulin Glargine (LANTUS SOLOSTAR) 100 UNIT/ML Solostar Pen Inject 14 Units into the skin daily at 10 pm.     . insulin lispro (HUMALOG KWIKPEN) 100 UNIT/ML KiwkPen Inject into the skin. Sliding scale before meal    . Insulin Pen Needle (BD PEN NEEDLE NANO 2ND GEN) 32G X 4 MM  MISC INJECT AS DIRECTED FOUR TIMES A DAY    . losartan (COZAAR) 100 MG tablet Take 1 tablet (100 mg total) by mouth daily. 90 tablet 0  . metoprolol succinate (TOPROL XL) 25 MG 24 hr tablet Take 0.5 tablets (12.5 mg total) by mouth as needed. AS NEEDED FOR PALPITATIONS 30 tablet 11  . Multiple Vitamin (MULTIVITAMIN) tablet Take 1 tablet by mouth daily. For breast and bone health    . saccharomyces boulardii (FLORASTOR) 250 MG capsule Take 1 capsule (250 mg total) by mouth 2 (two) times daily. 60 capsule 5  . simvastatin (ZOCOR) 20 MG tablet TAKE ONE TABLET AT BEDTIME. 90 tablet 1  . SYNTHROID 100 MCG tablet Take 1 tablet (100 mcg total) by mouth daily  before breakfast.     No current facility-administered medications on file prior to visit.    Past Medical History:  Diagnosis Date  . Anxiety   . Arthritis    fingers  . Bowel obstruction ([place])   . Cancer of upper-outer quadrant of female breast ([place]) 08/21/2011   ER +  PR +  Her 2 -  Ki67 9%   0.9 cm invasive lobular  Carcinoma ,s/p central lumpectomy with sentinel node biopsy,  ER/PR positive s/p re-excion on 09/16/11 with final pathology showing atypical hyperplasia   . CAP (community acquired pneumonia) 12/06/2016  . Diabetes mellitus type 1 (HCC)    type I- wears insulin pump  . GERD (gastroesophageal reflux disease)   . History of small bowel obstruction   . Hypercholesteremia   . Hypergastrinemia   . Hypertension   . Hypertension   . Hyponatremia   . Hypothyroid   . Hypothyroidism   . IBS (irritable bowel syndrome)   . Osteoarthritis   . Osteoporosis, post-menopausal    DEXA 12/02/11: -3.5 (with lomax gyn), declines bisphos due to bone pain  . PAF (paroxysmal atrial fibrillation) ([place])    One episode in 2008, spontaneously converted in the ED, no further treatment  . Spondylosis    thoracic spine  . Thyroid nodule dx 07/2011   [place] q 67moto follow calcified L thyroid nodule (12/08/11 [place]  . Umbilical hernia     Past Surgical History:  Procedure Laterality Date  . ABDOMINAL HYSTERECTOMY    . ABDOMINAL HYSTERECTOMY  yrs ago  . APPENDECTOMY    . APPENDECTOMY  age 89 . BLADDER REPAIR  5 yrs ago  . BREAST LUMPECTOMY    . BREAST SURGERY     biopsy  . BREAST SURGERY   yrs ago   br bx  . BREAST SURGERY  01/ 23/2013   left central lumpectomy and snbx, re-excision lumpectomy  . CARDIOVASCULAR STRESS TEST  03/17/2012  . colon surgery     Small intestines- took 6 inches out  . COLONOSCOPY  07/11/2015   [facility] KDerrill Kay . ESOPHAGOGASTRODUODENOSCOPY  07/17/2010   [facility]  . LEFT HEART CATHETERIZATION WITH CORONARY ANGIOGRAM N/A 03/18/2012   Procedure: LEFT HEART  CATHETERIZATION WITH CORONARY ANGIOGRAM;  Surgeon: JMinus Breeding MD;  [facility];  Service: Cardiovascular;  Laterality: N/A;  . TONSILLECTOMY   age 86   Social History   Socioeconomic History  . Marital status: Married    Spouse name: Not on file  . Number of children: Not on file  . Years of education: Not on file  . Highest education level: Not on file  Occupational History  . Not on file  Tobacco Use  .  Smoking status: Former Smoker    Packs/day: 1.00    Years: 10.00    Pack years: 10.00    Types: Cigarettes    Quit date: 08/27/1977    Years since quitting: 42.3  . Smokeless tobacco: Never Used  Substance and Sexual Activity  . Alcohol use: Yes    Comment: rare  . Drug use: No  . Sexual activity: Yes    Birth control/protection: Post-menopausal  Other Topics Concern  . Not on file  Social History Narrative   ** Merged History Encounter **       Social Determinants of Health   Financial Resource Strain:   . Difficulty of Paying Living Expenses:   Food Insecurity:   . Worried About Charity fundraiser in the Last Year:   . Arboriculturist in the Last Year:   Transportation Needs:   . Film/video editor (Medical):   [name] Lack of Transportation (Non-Medical):   Physical Activity:   . Days of Exercise per Week:   . Minutes of Exercise per Session:   Stress:   . Feeling of Stress :   Social Connections:   . Frequency of Communication with Friends and Family:   . Frequency of Social Gatherings with Friends and Family:   . Attends Religious Services:   . Active Member of Clubs or Organizations:   . Attends Archivist Meetings:   [name] Marital Status:     Family History  Problem Relation Age of Onset  . Cancer Sister        unknown  . Hypertension Mother   . Stroke Mother   . Arthritis Father   . Heart disease Father   . Diabetes Son   . Colon cancer Neg Hx   . Esophageal cancer Neg Hx     Review of Systems Per HPI     Objective:   Vitals:   12/15/19 1039  BP: (!) 156/72  Pulse: 72  Resp: 16  Temp: 98 F (36.7 C)  SpO2: 97%   BP Readings from Last 3 Encounters:  12/15/19 (!) 156/72  12/06/19 (!) 154/72  10/09/19 (!) 154/87   Wt Readings from Last 3 Encounters:  12/15/19 138 lb (62.6 kg)  12/06/19 136 lb (61.7 kg)  10/09/19 138 lb 8 oz (62.8 kg)   Body mass index is 21.61 kg/m.   Physical Exam    Constitutional: Appears well-developed and well-nourished. No distress.  Head: Normocephalic and atraumatic.  Cardiovascular: Normal rate, regular rhythm and normal heart sounds.  No murmur heard. No carotid bruit .  No edema Pulmonary/Chest: Effort normal and breath sounds normal. No respiratory distress. No has no wheezes. No rales.  Abdomen: soft, mild tenderness suprapubic region and LLQ w/o rebound or guarding Skin: Skin is warm and dry. Not diaphoretic.  Psychiatric: Normal mood and affect. Behavior is normal.       Assessment & Plan:    See Problem List for Assessment and Plan of chronic medical problems.    This visit occurred during the SARS-CoV-2 public health emergency.  Safety protocols were in place, including screening questions prior to the visit, additional usage of staff PPE, and extensive cleaning of exam room while observing appropriate contact time as indicated for disinfecting solutions.

## 2019-12-15 ENCOUNTER — Ambulatory Visit: Payer: Medicare PPO | Admitting: Internal Medicine

## 2019-12-15 ENCOUNTER — Other Ambulatory Visit: Payer: Self-pay | Admitting: Internal Medicine

## 2019-12-15 ENCOUNTER — Encounter: Payer: Self-pay | Admitting: Internal Medicine

## 2019-12-15 ENCOUNTER — Other Ambulatory Visit: Payer: Self-pay

## 2019-12-15 VITALS — BP 156/72 | HR 72 | Temp 98.0°F | Resp 16 | Ht 67.0 in | Wt 138.0 lb

## 2019-12-15 DIAGNOSIS — E871 Hypo-osmolality and hyponatremia: Secondary | ICD-10-CM | POA: Diagnosis not present

## 2019-12-15 DIAGNOSIS — E039 Hypothyroidism, unspecified: Secondary | ICD-10-CM | POA: Diagnosis not present

## 2019-12-15 DIAGNOSIS — N390 Urinary tract infection, site not specified: Secondary | ICD-10-CM | POA: Insufficient documentation

## 2019-12-15 DIAGNOSIS — R300 Dysuria: Secondary | ICD-10-CM | POA: Diagnosis not present

## 2019-12-15 DIAGNOSIS — N3000 Acute cystitis without hematuria: Secondary | ICD-10-CM | POA: Diagnosis not present

## 2019-12-15 DIAGNOSIS — E538 Deficiency of other specified B group vitamins: Secondary | ICD-10-CM

## 2019-12-15 LAB — POCT URINALYSIS DIPSTICK
Bilirubin, UA: NEGATIVE
Glucose, UA: NEGATIVE
Ketones, UA: NEGATIVE
Nitrite, UA: POSITIVE
Protein, UA: POSITIVE — AB
Spec Grav, UA: 1.015 (ref 1.010–1.025)
Urobilinogen, UA: NEGATIVE E.U./dL — AB
pH, UA: 5.5 (ref 5.0–8.0)

## 2019-12-15 LAB — COMPREHENSIVE METABOLIC PANEL
ALT: 11 U/L (ref 0–35)
AST: 20 U/L (ref 0–37)
Albumin: 4 g/dL (ref 3.5–5.2)
Alkaline Phosphatase: 76 U/L (ref 39–117)
BUN: 19 mg/dL (ref 6–23)
CO2: 28 mEq/L (ref 19–32)
Calcium: 9.8 mg/dL (ref 8.4–10.5)
Chloride: 98 mEq/L (ref 96–112)
Creatinine, Ser: 0.79 mg/dL (ref 0.40–1.20)
GFR: 68.6 mL/min (ref >60.00)
Glucose, Bld: 170 mg/dL — ABNORMAL HIGH (ref 70–99)
Potassium: 3.9 mEq/L (ref 3.5–5.1)
Sodium: 130 mEq/L — ABNORMAL LOW (ref 135–145)
Total Bilirubin: 0.5 mg/dL (ref 0.2–1.2)
Total Protein: 7.5 g/dL (ref 6.0–8.3)

## 2019-12-15 LAB — TSH: TSH: 1.44 u[IU]/mL (ref 0.35–4.50)

## 2019-12-15 MED ORDER — CIPROFLOXACIN HCL 250 MG PO TABS: 250.0000 mg | ORAL_TABLET | Freq: Two times a day (BID) | ORAL | 0 refills | Status: DC

## 2019-12-15 MED ORDER — PHENAZOPYRIDINE HCL 200 MG PO TABS: 200.0000 mg | ORAL_TABLET | Freq: Three times a day (TID) | ORAL | 0 refills | Status: DC | PRN

## 2019-12-15 MED ORDER — CYANOCOBALAMIN 1000 MCG/ML IJ SOLN
1000.0000 ug | Freq: Once | INTRAMUSCULAR | Status: AC
  Administered 2019-12-15: 1000 ug via INTRAMUSCULAR

## 2019-12-15 NOTE — Patient Instructions (Signed)
  Blood work was ordered.     Medications reviewed and updated.  Changes include :   cipro for your uti and a pain medication for the pain from the uti  Your prescription(s) have been submitted to your pharmacy. Please take as directed and contact our office if you believe you are having problem(s) with the medication(s).  You had a B12 injection today

## 2019-12-15 NOTE — Assessment & Plan Note (Signed)
Chronic B12 injection today and monthly

## 2019-12-15 NOTE — Assessment & Plan Note (Signed)
Chronic  Dose adjusted two months ago - due to have tsh rechecked Clinically euthyroid Check tsh  Titrate med dose if needed

## 2019-12-15 NOTE — Addendum Note (Signed)
Addended by: Delice Bison E on: 12/15/2019 01:15 PM   Modules accepted: Orders

## 2019-12-15 NOTE — Assessment & Plan Note (Addendum)
Urine dip consistent with UTI Will send urine for culture Take the antibiotic as prescribed.   Pyridium prn for pain - advised to take AZO if this is too expensive Take tylenol if needed.   Increase your water intake.  Call if no improvement

## 2019-12-15 NOTE — Assessment & Plan Note (Addendum)
Chronic Endo was concerned about possibility of Addison's disease - she deferred further evaluation of that  Was thought to be related to hctz, but she is no longer on this cmp

## 2019-12-17 ENCOUNTER — Encounter: Payer: Self-pay | Admitting: Internal Medicine

## 2019-12-17 LAB — URINE CULTURE

## 2019-12-18 ENCOUNTER — Other Ambulatory Visit: Payer: Self-pay

## 2019-12-18 ENCOUNTER — Other Ambulatory Visit: Payer: Self-pay | Admitting: Internal Medicine

## 2019-12-18 DIAGNOSIS — M4854XD Collapsed vertebra, not elsewhere classified, thoracic region, subsequent encounter for fracture with routine healing: Secondary | ICD-10-CM | POA: Diagnosis not present

## 2019-12-18 DIAGNOSIS — M6281 Muscle weakness (generalized): Secondary | ICD-10-CM | POA: Diagnosis not present

## 2019-12-18 DIAGNOSIS — R293 Abnormal posture: Secondary | ICD-10-CM | POA: Diagnosis not present

## 2019-12-18 DIAGNOSIS — R2681 Unsteadiness on feet: Secondary | ICD-10-CM | POA: Diagnosis not present

## 2019-12-18 DIAGNOSIS — M5489 Other dorsalgia: Secondary | ICD-10-CM | POA: Diagnosis not present

## 2019-12-18 DIAGNOSIS — R2689 Other abnormalities of gait and mobility: Secondary | ICD-10-CM | POA: Diagnosis not present

## 2019-12-18 DIAGNOSIS — M2569 Stiffness of other specified joint, not elsewhere classified: Secondary | ICD-10-CM | POA: Diagnosis not present

## 2019-12-18 MED ORDER — NITROFURANTOIN MONOHYD MACRO 100 MG PO CAPS
100.0000 mg | ORAL_CAPSULE | Freq: Two times a day (BID) | ORAL | 0 refills | Status: DC
Start: 2019-12-18 — End: 2019-12-18

## 2019-12-18 MED ORDER — NITROFURANTOIN MONOHYD MACRO 100 MG PO CAPS: 100.0000 mg | ORAL_CAPSULE | Freq: Two times a day (BID) | ORAL | 0 refills | Status: DC

## 2019-12-20 DIAGNOSIS — M6281 Muscle weakness (generalized): Secondary | ICD-10-CM | POA: Diagnosis not present

## 2019-12-20 DIAGNOSIS — M5489 Other dorsalgia: Secondary | ICD-10-CM | POA: Diagnosis not present

## 2019-12-20 DIAGNOSIS — R2689 Other abnormalities of gait and mobility: Secondary | ICD-10-CM | POA: Diagnosis not present

## 2019-12-20 DIAGNOSIS — M4854XD Collapsed vertebra, not elsewhere classified, thoracic region, subsequent encounter for fracture with routine healing: Secondary | ICD-10-CM | POA: Diagnosis not present

## 2019-12-20 DIAGNOSIS — R2681 Unsteadiness on feet: Secondary | ICD-10-CM | POA: Diagnosis not present

## 2019-12-20 DIAGNOSIS — R293 Abnormal posture: Secondary | ICD-10-CM | POA: Diagnosis not present

## 2019-12-20 DIAGNOSIS — M2569 Stiffness of other specified joint, not elsewhere classified: Secondary | ICD-10-CM | POA: Diagnosis not present

## 2019-12-22 ENCOUNTER — Ambulatory Visit: Payer: Medicare PPO

## 2019-12-22 ENCOUNTER — Other Ambulatory Visit: Payer: Self-pay

## 2019-12-22 ENCOUNTER — Ambulatory Visit
Admission: RE | Admit: 2019-12-22 | Discharge: 2019-12-22 | Disposition: A | Discharge status: Home / Self Care | Payer: Medicare PPO | Source: Ambulatory Visit | Attending: Internal Medicine | Admitting: Internal Medicine

## 2019-12-22 ENCOUNTER — Other Ambulatory Visit: Payer: Medicare PPO

## 2019-12-22 ENCOUNTER — Other Ambulatory Visit: Payer: Self-pay | Admitting: Internal Medicine

## 2019-12-22 DIAGNOSIS — M810 Age-related osteoporosis without current pathological fracture: Secondary | ICD-10-CM

## 2019-12-22 DIAGNOSIS — F411 Generalized anxiety disorder: Secondary | ICD-10-CM

## 2019-12-22 DIAGNOSIS — Z780 Asymptomatic menopausal state: Secondary | ICD-10-CM | POA: Diagnosis not present

## 2019-12-22 NOTE — Telephone Encounter (Signed)
Last OV 10/04/19 Next OV 03/26/20 Last RF 11/22/19

## 2019-12-26 ENCOUNTER — Other Ambulatory Visit: Payer: Self-pay

## 2019-12-26 ENCOUNTER — Encounter: Payer: Self-pay | Admitting: Internal Medicine

## 2019-12-26 ENCOUNTER — Other Ambulatory Visit (INDEPENDENT_AMBULATORY_CARE_PROVIDER_SITE_OTHER): Payer: Medicare PPO

## 2019-12-26 ENCOUNTER — Other Ambulatory Visit: Payer: Self-pay | Admitting: Internal Medicine

## 2019-12-26 DIAGNOSIS — R350 Frequency of micturition: Secondary | ICD-10-CM

## 2019-12-26 LAB — URINALYSIS, ROUTINE W REFLEX MICROSCOPIC
Bilirubin Urine: NEGATIVE
Hgb urine dipstick: NEGATIVE
Ketones, ur: NEGATIVE
Leukocytes,Ua: NEGATIVE
Nitrite: NEGATIVE
RBC / HPF: NONE SEEN (ref 0–?)
Specific Gravity, Urine: 1.01 (ref 1.000–1.030)
Total Protein, Urine: NEGATIVE
Urine Glucose: NEGATIVE
Urobilinogen, UA: 0.2 (ref 0.0–1.0)
pH: 6 (ref 5.0–8.0)

## 2019-12-26 NOTE — Telephone Encounter (Signed)
Spoke with pt in regards  

## 2019-12-27 DIAGNOSIS — M4854XD Collapsed vertebra, not elsewhere classified, thoracic region, subsequent encounter for fracture with routine healing: Secondary | ICD-10-CM | POA: Diagnosis not present

## 2019-12-27 DIAGNOSIS — M6281 Muscle weakness (generalized): Secondary | ICD-10-CM | POA: Diagnosis not present

## 2019-12-27 DIAGNOSIS — M2569 Stiffness of other specified joint, not elsewhere classified: Secondary | ICD-10-CM | POA: Diagnosis not present

## 2019-12-27 DIAGNOSIS — E109 Type 1 diabetes mellitus without complications: Secondary | ICD-10-CM | POA: Diagnosis not present

## 2019-12-27 DIAGNOSIS — R293 Abnormal posture: Secondary | ICD-10-CM | POA: Diagnosis not present

## 2019-12-27 DIAGNOSIS — R2689 Other abnormalities of gait and mobility: Secondary | ICD-10-CM | POA: Diagnosis not present

## 2019-12-27 DIAGNOSIS — R2681 Unsteadiness on feet: Secondary | ICD-10-CM | POA: Diagnosis not present

## 2019-12-27 DIAGNOSIS — M5489 Other dorsalgia: Secondary | ICD-10-CM | POA: Diagnosis not present

## 2019-12-27 LAB — UNLABELED: Test Ordered On Req: 395

## 2019-12-28 DIAGNOSIS — M4854XD Collapsed vertebra, not elsewhere classified, thoracic region, subsequent encounter for fracture with routine healing: Secondary | ICD-10-CM | POA: Diagnosis not present

## 2019-12-28 DIAGNOSIS — M5489 Other dorsalgia: Secondary | ICD-10-CM | POA: Diagnosis not present

## 2019-12-28 DIAGNOSIS — R2689 Other abnormalities of gait and mobility: Secondary | ICD-10-CM | POA: Diagnosis not present

## 2019-12-28 DIAGNOSIS — M6281 Muscle weakness (generalized): Secondary | ICD-10-CM | POA: Diagnosis not present

## 2019-12-28 DIAGNOSIS — R2681 Unsteadiness on feet: Secondary | ICD-10-CM | POA: Diagnosis not present

## 2019-12-28 DIAGNOSIS — M2569 Stiffness of other specified joint, not elsewhere classified: Secondary | ICD-10-CM | POA: Diagnosis not present

## 2019-12-28 DIAGNOSIS — R293 Abnormal posture: Secondary | ICD-10-CM | POA: Diagnosis not present

## 2019-12-31 ENCOUNTER — Encounter: Payer: Self-pay | Admitting: Internal Medicine

## 2020-01-01 ENCOUNTER — Other Ambulatory Visit: Payer: Self-pay

## 2020-01-01 DIAGNOSIS — R350 Frequency of micturition: Secondary | ICD-10-CM | POA: Diagnosis not present

## 2020-01-02 DIAGNOSIS — M5489 Other dorsalgia: Secondary | ICD-10-CM | POA: Diagnosis not present

## 2020-01-02 DIAGNOSIS — R293 Abnormal posture: Secondary | ICD-10-CM | POA: Diagnosis not present

## 2020-01-02 DIAGNOSIS — R2681 Unsteadiness on feet: Secondary | ICD-10-CM | POA: Diagnosis not present

## 2020-01-02 DIAGNOSIS — R2689 Other abnormalities of gait and mobility: Secondary | ICD-10-CM | POA: Diagnosis not present

## 2020-01-02 DIAGNOSIS — M4854XD Collapsed vertebra, not elsewhere classified, thoracic region, subsequent encounter for fracture with routine healing: Secondary | ICD-10-CM | POA: Diagnosis not present

## 2020-01-02 DIAGNOSIS — M2569 Stiffness of other specified joint, not elsewhere classified: Secondary | ICD-10-CM | POA: Diagnosis not present

## 2020-01-02 DIAGNOSIS — M6281 Muscle weakness (generalized): Secondary | ICD-10-CM | POA: Diagnosis not present

## 2020-01-02 LAB — URINE CULTURE
MICRO NUMBER:: 10511547
SPECIMEN QUALITY:: ADEQUATE

## 2020-01-02 LAB — MICROSCOPIC EXAMINATION
Bacteria, UA: NONE SEEN
Casts: NONE SEEN /lpf
RBC, Urine: NONE SEEN /hpf (ref 0–2)

## 2020-01-02 LAB — URINALYSIS, ROUTINE W REFLEX MICROSCOPIC
Bilirubin, UA: NEGATIVE
Glucose, UA: NEGATIVE
Ketones, UA: NEGATIVE
Nitrite, UA: NEGATIVE
Protein,UA: NEGATIVE
RBC, UA: NEGATIVE
Specific Gravity, UA: 1.015 (ref 1.005–1.030)
Urobilinogen, Ur: 0.2 mg/dL (ref 0.2–1.0)
pH, UA: 6.5 (ref 5.0–7.5)

## 2020-01-02 LAB — PAT ID TIQ DOC: Test Affected: 395

## 2020-01-03 ENCOUNTER — Encounter: Payer: Self-pay | Admitting: Internal Medicine

## 2020-01-03 LAB — URINE CULTURE

## 2020-01-05 DIAGNOSIS — M2569 Stiffness of other specified joint, not elsewhere classified: Secondary | ICD-10-CM | POA: Diagnosis not present

## 2020-01-05 DIAGNOSIS — R2681 Unsteadiness on feet: Secondary | ICD-10-CM | POA: Diagnosis not present

## 2020-01-05 DIAGNOSIS — M5489 Other dorsalgia: Secondary | ICD-10-CM | POA: Diagnosis not present

## 2020-01-05 DIAGNOSIS — M6281 Muscle weakness (generalized): Secondary | ICD-10-CM | POA: Diagnosis not present

## 2020-01-05 DIAGNOSIS — M4854XD Collapsed vertebra, not elsewhere classified, thoracic region, subsequent encounter for fracture with routine healing: Secondary | ICD-10-CM | POA: Diagnosis not present

## 2020-01-05 DIAGNOSIS — R2689 Other abnormalities of gait and mobility: Secondary | ICD-10-CM | POA: Diagnosis not present

## 2020-01-05 DIAGNOSIS — R293 Abnormal posture: Secondary | ICD-10-CM | POA: Diagnosis not present

## 2020-01-10 ENCOUNTER — Ambulatory Visit: Payer: Medicare PPO

## 2020-01-11 ENCOUNTER — Ambulatory Visit (INDEPENDENT_AMBULATORY_CARE_PROVIDER_SITE_OTHER): Payer: Medicare PPO | Admitting: *Deleted

## 2020-01-11 ENCOUNTER — Other Ambulatory Visit: Payer: Self-pay

## 2020-01-11 DIAGNOSIS — E538 Deficiency of other specified B group vitamins: Secondary | ICD-10-CM

## 2020-01-11 MED ORDER — CYANOCOBALAMIN 1000 MCG/ML IJ SOLN
1000.0000 ug | Freq: Once | INTRAMUSCULAR | Status: AC
  Administered 2020-01-11: 1000 ug via INTRAMUSCULAR

## 2020-01-11 NOTE — Progress Notes (Signed)
Pls cosign for B12 inj in absence of PCP for this afternoon./lmb

## 2020-01-11 NOTE — Progress Notes (Signed)
Medical treatment/procedure(s) were performed by non-physician practitioner and as supervising physician I was immediately available for consultation/collaboration. I agree with above. Kolston Lacount A Abbigail Anstey, MD  

## 2020-01-12 DIAGNOSIS — M4854XD Collapsed vertebra, not elsewhere classified, thoracic region, subsequent encounter for fracture with routine healing: Secondary | ICD-10-CM | POA: Diagnosis not present

## 2020-01-12 DIAGNOSIS — R2689 Other abnormalities of gait and mobility: Secondary | ICD-10-CM | POA: Diagnosis not present

## 2020-01-12 DIAGNOSIS — M2569 Stiffness of other specified joint, not elsewhere classified: Secondary | ICD-10-CM | POA: Diagnosis not present

## 2020-01-12 DIAGNOSIS — M5489 Other dorsalgia: Secondary | ICD-10-CM | POA: Diagnosis not present

## 2020-01-12 DIAGNOSIS — M6281 Muscle weakness (generalized): Secondary | ICD-10-CM | POA: Diagnosis not present

## 2020-01-12 DIAGNOSIS — R293 Abnormal posture: Secondary | ICD-10-CM | POA: Diagnosis not present

## 2020-01-12 DIAGNOSIS — R2681 Unsteadiness on feet: Secondary | ICD-10-CM | POA: Diagnosis not present

## 2020-01-16 DIAGNOSIS — M2569 Stiffness of other specified joint, not elsewhere classified: Secondary | ICD-10-CM | POA: Diagnosis not present

## 2020-01-16 DIAGNOSIS — M6281 Muscle weakness (generalized): Secondary | ICD-10-CM | POA: Diagnosis not present

## 2020-01-16 DIAGNOSIS — R293 Abnormal posture: Secondary | ICD-10-CM | POA: Diagnosis not present

## 2020-01-16 DIAGNOSIS — R2689 Other abnormalities of gait and mobility: Secondary | ICD-10-CM | POA: Diagnosis not present

## 2020-01-16 DIAGNOSIS — M4854XD Collapsed vertebra, not elsewhere classified, thoracic region, subsequent encounter for fracture with routine healing: Secondary | ICD-10-CM | POA: Diagnosis not present

## 2020-01-16 DIAGNOSIS — R2681 Unsteadiness on feet: Secondary | ICD-10-CM | POA: Diagnosis not present

## 2020-01-16 DIAGNOSIS — M5489 Other dorsalgia: Secondary | ICD-10-CM | POA: Diagnosis not present

## 2020-01-18 DIAGNOSIS — M4854XD Collapsed vertebra, not elsewhere classified, thoracic region, subsequent encounter for fracture with routine healing: Secondary | ICD-10-CM | POA: Diagnosis not present

## 2020-01-23 ENCOUNTER — Ambulatory Visit: Payer: Medicare PPO

## 2020-01-23 DIAGNOSIS — E871 Hypo-osmolality and hyponatremia: Secondary | ICD-10-CM | POA: Diagnosis not present

## 2020-01-23 DIAGNOSIS — E038 Other specified hypothyroidism: Secondary | ICD-10-CM | POA: Diagnosis not present

## 2020-01-23 DIAGNOSIS — E063 Autoimmune thyroiditis: Secondary | ICD-10-CM | POA: Diagnosis not present

## 2020-01-23 DIAGNOSIS — E785 Hyperlipidemia, unspecified: Secondary | ICD-10-CM | POA: Diagnosis not present

## 2020-01-23 DIAGNOSIS — E1069 Type 1 diabetes mellitus with other specified complication: Secondary | ICD-10-CM | POA: Diagnosis not present

## 2020-01-23 DIAGNOSIS — E1042 Type 1 diabetes mellitus with diabetic polyneuropathy: Secondary | ICD-10-CM | POA: Diagnosis not present

## 2020-01-24 DIAGNOSIS — R293 Abnormal posture: Secondary | ICD-10-CM | POA: Diagnosis not present

## 2020-01-24 DIAGNOSIS — M2569 Stiffness of other specified joint, not elsewhere classified: Secondary | ICD-10-CM | POA: Diagnosis not present

## 2020-01-24 DIAGNOSIS — M4854XD Collapsed vertebra, not elsewhere classified, thoracic region, subsequent encounter for fracture with routine healing: Secondary | ICD-10-CM | POA: Diagnosis not present

## 2020-01-24 DIAGNOSIS — R2681 Unsteadiness on feet: Secondary | ICD-10-CM | POA: Diagnosis not present

## 2020-01-24 DIAGNOSIS — R2689 Other abnormalities of gait and mobility: Secondary | ICD-10-CM | POA: Diagnosis not present

## 2020-01-24 DIAGNOSIS — M6281 Muscle weakness (generalized): Secondary | ICD-10-CM | POA: Diagnosis not present

## 2020-01-24 DIAGNOSIS — M5489 Other dorsalgia: Secondary | ICD-10-CM | POA: Diagnosis not present

## 2020-01-25 ENCOUNTER — Other Ambulatory Visit: Payer: Self-pay | Admitting: Internal Medicine

## 2020-01-25 DIAGNOSIS — R2689 Other abnormalities of gait and mobility: Secondary | ICD-10-CM | POA: Diagnosis not present

## 2020-01-25 DIAGNOSIS — R2681 Unsteadiness on feet: Secondary | ICD-10-CM | POA: Diagnosis not present

## 2020-01-25 DIAGNOSIS — M2569 Stiffness of other specified joint, not elsewhere classified: Secondary | ICD-10-CM | POA: Diagnosis not present

## 2020-01-25 DIAGNOSIS — M4854XD Collapsed vertebra, not elsewhere classified, thoracic region, subsequent encounter for fracture with routine healing: Secondary | ICD-10-CM | POA: Diagnosis not present

## 2020-01-25 DIAGNOSIS — M6281 Muscle weakness (generalized): Secondary | ICD-10-CM | POA: Diagnosis not present

## 2020-01-25 DIAGNOSIS — M5489 Other dorsalgia: Secondary | ICD-10-CM | POA: Diagnosis not present

## 2020-01-25 DIAGNOSIS — R293 Abnormal posture: Secondary | ICD-10-CM | POA: Diagnosis not present

## 2020-01-26 ENCOUNTER — Other Ambulatory Visit: Payer: Self-pay

## 2020-01-26 ENCOUNTER — Ambulatory Visit
Admission: RE | Admit: 2020-01-26 | Discharge: 2020-01-26 | Disposition: A | Discharge status: Home / Self Care | Payer: Medicare PPO | Source: Ambulatory Visit | Attending: Hematology and Oncology | Admitting: Hematology and Oncology

## 2020-01-26 ENCOUNTER — Other Ambulatory Visit: Payer: Self-pay | Admitting: Internal Medicine

## 2020-01-26 DIAGNOSIS — Z1231 Encounter for screening mammogram for malignant neoplasm of breast: Secondary | ICD-10-CM

## 2020-01-26 DIAGNOSIS — F411 Generalized anxiety disorder: Secondary | ICD-10-CM

## 2020-01-26 DIAGNOSIS — E109 Type 1 diabetes mellitus without complications: Secondary | ICD-10-CM | POA: Diagnosis not present

## 2020-01-26 NOTE — Telephone Encounter (Signed)
Done erx 

## 2020-02-01 DIAGNOSIS — M5489 Other dorsalgia: Secondary | ICD-10-CM | POA: Diagnosis not present

## 2020-02-01 DIAGNOSIS — M6281 Muscle weakness (generalized): Secondary | ICD-10-CM | POA: Diagnosis not present

## 2020-02-01 DIAGNOSIS — R2689 Other abnormalities of gait and mobility: Secondary | ICD-10-CM | POA: Diagnosis not present

## 2020-02-01 DIAGNOSIS — M4854XD Collapsed vertebra, not elsewhere classified, thoracic region, subsequent encounter for fracture with routine healing: Secondary | ICD-10-CM | POA: Diagnosis not present

## 2020-02-01 DIAGNOSIS — R293 Abnormal posture: Secondary | ICD-10-CM | POA: Diagnosis not present

## 2020-02-01 DIAGNOSIS — R2681 Unsteadiness on feet: Secondary | ICD-10-CM | POA: Diagnosis not present

## 2020-02-01 DIAGNOSIS — M2569 Stiffness of other specified joint, not elsewhere classified: Secondary | ICD-10-CM | POA: Diagnosis not present

## 2020-02-07 DIAGNOSIS — R293 Abnormal posture: Secondary | ICD-10-CM | POA: Diagnosis not present

## 2020-02-07 DIAGNOSIS — M6281 Muscle weakness (generalized): Secondary | ICD-10-CM | POA: Diagnosis not present

## 2020-02-07 DIAGNOSIS — M2569 Stiffness of other specified joint, not elsewhere classified: Secondary | ICD-10-CM | POA: Diagnosis not present

## 2020-02-07 DIAGNOSIS — R2689 Other abnormalities of gait and mobility: Secondary | ICD-10-CM | POA: Diagnosis not present

## 2020-02-07 DIAGNOSIS — M4854XD Collapsed vertebra, not elsewhere classified, thoracic region, subsequent encounter for fracture with routine healing: Secondary | ICD-10-CM | POA: Diagnosis not present

## 2020-02-07 DIAGNOSIS — R2681 Unsteadiness on feet: Secondary | ICD-10-CM | POA: Diagnosis not present

## 2020-02-07 DIAGNOSIS — M5489 Other dorsalgia: Secondary | ICD-10-CM | POA: Diagnosis not present

## 2020-02-09 DIAGNOSIS — R293 Abnormal posture: Secondary | ICD-10-CM | POA: Diagnosis not present

## 2020-02-09 DIAGNOSIS — M2569 Stiffness of other specified joint, not elsewhere classified: Secondary | ICD-10-CM | POA: Diagnosis not present

## 2020-02-09 DIAGNOSIS — R2689 Other abnormalities of gait and mobility: Secondary | ICD-10-CM | POA: Diagnosis not present

## 2020-02-09 DIAGNOSIS — M4854XD Collapsed vertebra, not elsewhere classified, thoracic region, subsequent encounter for fracture with routine healing: Secondary | ICD-10-CM | POA: Diagnosis not present

## 2020-02-09 DIAGNOSIS — R2681 Unsteadiness on feet: Secondary | ICD-10-CM | POA: Diagnosis not present

## 2020-02-09 DIAGNOSIS — M5489 Other dorsalgia: Secondary | ICD-10-CM | POA: Diagnosis not present

## 2020-02-09 DIAGNOSIS — M6281 Muscle weakness (generalized): Secondary | ICD-10-CM | POA: Diagnosis not present

## 2020-02-14 ENCOUNTER — Ambulatory Visit: Payer: Medicare PPO

## 2020-02-19 ENCOUNTER — Ambulatory Visit: Payer: Medicare PPO

## 2020-02-21 ENCOUNTER — Other Ambulatory Visit: Payer: Self-pay

## 2020-02-21 ENCOUNTER — Ambulatory Visit (INDEPENDENT_AMBULATORY_CARE_PROVIDER_SITE_OTHER): Payer: Medicare PPO | Admitting: *Deleted

## 2020-02-21 DIAGNOSIS — E538 Deficiency of other specified B group vitamins: Secondary | ICD-10-CM | POA: Diagnosis not present

## 2020-02-21 MED ORDER — CYANOCOBALAMIN 1000 MCG/ML IJ SOLN
1000.0000 ug | Freq: Once | INTRAMUSCULAR | Status: AC
  Administered 2020-02-21: 1000 ug via INTRAMUSCULAR

## 2020-02-21 NOTE — Progress Notes (Signed)
Pls cosign for B12 inj../lmb  

## 2020-02-22 ENCOUNTER — Telehealth: Payer: Self-pay | Admitting: Internal Medicine

## 2020-02-22 DIAGNOSIS — M5489 Other dorsalgia: Secondary | ICD-10-CM | POA: Diagnosis not present

## 2020-02-22 DIAGNOSIS — R2681 Unsteadiness on feet: Secondary | ICD-10-CM | POA: Diagnosis not present

## 2020-02-22 DIAGNOSIS — M4854XD Collapsed vertebra, not elsewhere classified, thoracic region, subsequent encounter for fracture with routine healing: Secondary | ICD-10-CM | POA: Diagnosis not present

## 2020-02-22 DIAGNOSIS — R2689 Other abnormalities of gait and mobility: Secondary | ICD-10-CM | POA: Diagnosis not present

## 2020-02-22 DIAGNOSIS — R293 Abnormal posture: Secondary | ICD-10-CM | POA: Diagnosis not present

## 2020-02-22 DIAGNOSIS — M2569 Stiffness of other specified joint, not elsewhere classified: Secondary | ICD-10-CM | POA: Diagnosis not present

## 2020-02-22 DIAGNOSIS — M6281 Muscle weakness (generalized): Secondary | ICD-10-CM | POA: Diagnosis not present

## 2020-02-22 NOTE — Telephone Encounter (Signed)
Insurance has been submitted and verified for Prolia. Patient is responsible for a $0 copay. Due anytime.  Called and informed patient. She is going to think about it and possibly talk to Dr Quay Burow about it before scheduling.

## 2020-02-26 DIAGNOSIS — E109 Type 1 diabetes mellitus without complications: Secondary | ICD-10-CM | POA: Diagnosis not present

## 2020-02-27 ENCOUNTER — Other Ambulatory Visit: Payer: Self-pay | Admitting: Internal Medicine

## 2020-02-27 DIAGNOSIS — M4854XD Collapsed vertebra, not elsewhere classified, thoracic region, subsequent encounter for fracture with routine healing: Secondary | ICD-10-CM | POA: Diagnosis not present

## 2020-02-27 DIAGNOSIS — M6281 Muscle weakness (generalized): Secondary | ICD-10-CM | POA: Diagnosis not present

## 2020-02-27 DIAGNOSIS — R293 Abnormal posture: Secondary | ICD-10-CM | POA: Diagnosis not present

## 2020-02-27 DIAGNOSIS — R2689 Other abnormalities of gait and mobility: Secondary | ICD-10-CM | POA: Diagnosis not present

## 2020-02-27 DIAGNOSIS — F411 Generalized anxiety disorder: Secondary | ICD-10-CM

## 2020-02-27 DIAGNOSIS — R2681 Unsteadiness on feet: Secondary | ICD-10-CM | POA: Diagnosis not present

## 2020-02-27 DIAGNOSIS — M5489 Other dorsalgia: Secondary | ICD-10-CM | POA: Diagnosis not present

## 2020-02-27 DIAGNOSIS — M2569 Stiffness of other specified joint, not elsewhere classified: Secondary | ICD-10-CM | POA: Diagnosis not present

## 2020-02-27 NOTE — Telephone Encounter (Signed)
Done erx 

## 2020-03-04 NOTE — Progress Notes (Signed)
Subjective:    Patient ID: [name], female    DOB: August 16, 1928, 86 y.o.   MRN: [id number]  HPI The patient is here for an acute visit.  Left leg swelling:   Her left leg is swelling.  She stepped off a curb and she twisted it but that was a while ago, maybe about one month ago.  It started swelling about three weeks ago.  She has swelling and pain  in her left heel.  She also has pain in her left groin region.  The swelling does improve after laying down all night.  She denies a history of blood clots.  Abdominal pain:  Every time she goes to the bathroom she has a BM.  She has abdominal pain in the LUQ - this is chronic.  She feels bloated.  She has multiple BM's a day and it is small.  Her bowel movements are watery and spray out.  She takes the fiber daily.  She does not take the miralax because she does not think she needs it.  She has a long history of chronic severe constipation.   Medications and allergies reviewed with patient and updated if appropriate.  Patient Active Problem List   Diagnosis Date Noted  . UTI (urinary tract infection) 12/15/2019  . B12 deficiency 12/15/2019  . Tingling in extremities 10/04/2019  . Urinary frequency 08/16/2019  . Chronic thoracic back pain 08/16/2019  . Atrial fibrillation ([place]) 02/01/2018  . Leg cramps 07/05/2017  . Constipation 01/25/2017  . Closed fracture of proximal end of right humerus 07/09/2016  . Cough 06/17/2016  . Abdominal pain 03/10/2016  . Hearing loss 01/03/2016  . Allergic rhinitis 01/03/2016  . Minimal Coronary Plaque by Cardiac Cath in 2013 09/11/2015  . Palpitations 09/06/2015  . Frequent loose stools 08/28/2015  . Hyponatremia   . Acute mesenteric ischemia ([place])   . Hypothyroidism 07/11/2012  . Syncope 03/18/2012  . Hypercholesteremia   . Hypertension   . Diabetes mellitus type 1 ([place])   . Osteoarthritis   . Thyroid nodule   . Osteoporosis, post-menopausal   . Primary cancer of upper outer  quadrant of left female breast ([place]) 08/21/2011    Current Outpatient Medications on File Prior to Visit  Medication Sig Dispense Refill  . ALPRAZolam (XANAX) 0.5 MG tablet TAKE 1/2 TABLET TWICE DAILY AS NEEDED FOR ANXIETY OR SLEEP 30 tablet 0  . aspirin EC 81 MG tablet Take 81 mg by mouth daily.    . B Complex-C (SUPER B COMPLEX PO) Take 1 tablet by mouth daily.    . BD VEO INSULIN SYRINGE U/F 31G X 15/64" 0.3 ML MISC USE WITH INJECTIONS 100 each 2  . bisacodyl (DULCOLAX) 10 MG suppository Place 1 suppository (10 mg total) rectally as needed for moderate constipation. 12 suppository 0  . carboxymethylcellulose (REFRESH PLUS) 0.5 % SOLN Place 1 drop into both eyes 3 (three) times daily as needed. Non-preservative    . cholecalciferol (VITAMIN D) 1000 UNITS tablet Take 5,000 Units by mouth daily.     . Continuous Blood Gluc Sensor ([place]) MISC by Does not apply route. Sensor is located in Right arm    . fish oil-omega-3 fatty acids 1000 MG capsule Take 2 g by mouth daily.    . Insulin Glargine (LANTUS SOLOSTAR) 100 UNIT/ML Solostar Pen Inject 14 Units into the skin daily at 10 pm.     . insulin lispro (HUMALOG KWIKPEN) 100 UNIT/ML KiwkPen Inject into  the skin. Sliding scale before meal    . Insulin Pen Needle (BD PEN NEEDLE NANO 2ND GEN) 32G X 4 MM MISC INJECT AS DIRECTED FOUR TIMES A DAY    . losartan (COZAAR) 100 MG tablet Take 1 tablet (100 mg total) by mouth daily. 90 tablet 0  . metoprolol succinate (TOPROL-XL) 25 MG 24 hr tablet Take 0.5 tablets (12.5 mg total) by mouth daily as needed. For palpitations. 45 tablet 3  . Multiple Vitamin (MULTIVITAMIN) tablet Take 1 tablet by mouth daily. For breast and bone health    . phenazopyridine (PYRIDIUM) 200 MG tablet Take 1 tablet (200 mg total) by mouth 3 (three) times daily as needed for pain. 6 tablet 0  . saccharomyces boulardii (FLORASTOR) 250 MG capsule Take 1 capsule (250 mg total) by mouth 2 (two) times daily. 60  capsule 5  . simvastatin (ZOCOR) 20 MG tablet TAKE ONE TABLET AT BEDTIME. 90 tablet 1  . SYNTHROID 100 MCG tablet Take 1 tablet (100 mcg total) by mouth daily before breakfast.     No current facility-administered medications on file prior to visit.    Past Medical History:  Diagnosis Date  . Anxiety   . Arthritis    fingers  . Bowel obstruction ([place])   . Cancer of upper-outer quadrant of female breast ([place]) 08/21/2011   ER +  PR +  Her 2 -  Ki67 9%   0.9 cm invasive lobular  Carcinoma ,s/p central lumpectomy with sentinel node biopsy,  ER/PR positive s/p re-excion on 09/16/11 with final pathology showing atypical hyperplasia   . CAP (community acquired pneumonia) 12/06/2016  . Diabetes mellitus type 1 ([facility])    type I- wears insulin pump  . GERD (gastroesophageal reflux disease)   . History of small bowel obstruction   . Hypercholesteremia   . Hypergastrinemia   . Hypertension   . Hypertension   . Hyponatremia   . Hypothyroid   . Hypothyroidism   . IBS (irritable bowel syndrome)   . Osteoarthritis   . Osteoporosis, post-menopausal    DEXA 12/02/11: -3.5 (with lomax gyn), declines bisphos due to bone pain  . PAF (paroxysmal atrial fibrillation) ([place])    One episode in 2008, spontaneously converted in the ED, no further treatment  . Spondylosis    thoracic spine  . Thyroid nodule dx 07/2011   [place] q 55moto follow calcified L thyroid nodule (12/08/11 [place]  . Umbilical hernia     Past Surgical History:  Procedure Laterality Date  . ABDOMINAL HYSTERECTOMY    . ABDOMINAL HYSTERECTOMY  yrs ago  . APPENDECTOMY    . APPENDECTOMY  age [id number] . BLADDER REPAIR  5 yrs ago  . BREAST LUMPECTOMY    . BREAST SURGERY     biopsy  . BREAST SURGERY   yrs ago   br bx  . BREAST SURGERY  01/ 23/2013   left central lumpectomy and snbx, re-excision lumpectomy  . CARDIOVASCULAR STRESS TEST  03/17/2012  . colon surgery     Small intestines- took 6 inches out  . COLONOSCOPY  07/11/2015   [facility]  Dr KDerrill Kay . ESOPHAGOGASTRODUODENOSCOPY  07/17/2010   [facility]  . LEFT HEART CATHETERIZATION WITH CORONARY ANGIOGRAM N/A 03/18/2012   Procedure: LEFT HEART CATHETERIZATION WITH CORONARY ANGIOGRAM;  Surgeon: JMinus Breeding MD;  [facility];  Service: Cardiovascular;  Laterality: N/A;  . TONSILLECTOMY   age 89   Social History   Socioeconomic History  .  Marital status: Married    Spouse name: Not on file  . Number of children: Not on file  . Years of education: Not on file  . Highest education level: Not on file  Occupational History  . Not on file  Tobacco Use  . Smoking status: Former Smoker    Packs/day: 1.00    Years: 10.00    Pack years: 10.00    Types: Cigarettes    Quit date: 08/27/1977    Years since quitting: 42.5  . Smokeless tobacco: Never Used  Vaping Use  . Vaping Use: Never used  Substance and Sexual Activity  . Alcohol use: Yes    Comment: rare  . Drug use: No  . Sexual activity: Yes    Birth control/protection: Post-menopausal  Other Topics Concern  . Not on file  Social History Narrative   ** Merged History Encounter **       Social Determinants of Health   Financial Resource Strain:   . Difficulty of Paying Living Expenses:   Food Insecurity:   . Worried About Charity fundraiser in the Last Year:   . Arboriculturist in the Last Year:   Transportation Needs:   . Film/video editor (Medical):   [name] Lack of Transportation (Non-Medical):   Physical Activity:   . Days of Exercise per Week:   . Minutes of Exercise per Session:   Stress:   . Feeling of Stress :   Social Connections:   . Frequency of Communication with Friends and Family:   . Frequency of Social Gatherings with Friends and Family:   . Attends Religious Services:   . Active Member of Clubs or Organizations:   . Attends Archivist Meetings:   [name] Marital Status:     Family History  Problem Relation Age of Onset  . Cancer Sister        unknown  .  Hypertension Mother   . Stroke Mother   . Arthritis Father   . Heart disease Father   . Diabetes Son   . Colon cancer Neg Hx   . Esophageal cancer Neg Hx     Review of Systems  Constitutional: Negative for fever.  Respiratory: Negative for shortness of breath.   Cardiovascular: Positive for leg swelling. Negative for chest pain.       Objective:   Vitals:   03/05/20 1549  BP: (!) 152/78  Pulse: 85  Temp: 98.2 F (36.8 C)  SpO2: 94%   BP Readings from Last 3 Encounters:  03/05/20 (!) 152/78  12/15/19 (!) 156/72  12/06/19 (!) 154/72   Wt Readings from Last 3 Encounters:  03/05/20 137 lb (62.1 kg)  12/15/19 138 lb (62.6 kg)  12/06/19 136 lb (61.7 kg)   Body mass index is 21.46 kg/m.   Physical Exam Constitutional:      General: She is not in acute distress.    Appearance: Normal appearance. She is not ill-appearing.  Abdominal:     General: There is distension.     Palpations: Abdomen is soft.     Tenderness: There is abdominal tenderness (Diffuse, mild). There is no guarding or rebound.  Musculoskeletal:     Right lower leg: No edema.     Left lower leg: Edema (Mild 1+ up to mid calf) present.  Skin:    General: Skin is warm and dry.     Findings: No erythema or rash.  Neurological:     Mental Status: She is  alert.            Assessment & Plan:    See Problem List for Assessment and Plan of chronic medical problems.    This visit occurred during the SARS-CoV-2 public health emergency.  Safety protocols were in place, including screening questions prior to the visit, additional usage of staff PPE, and extensive cleaning of exam room while observing appropriate contact time as indicated for disinfecting solutions.

## 2020-03-05 ENCOUNTER — Ambulatory Visit: Payer: Medicare PPO | Admitting: Internal Medicine

## 2020-03-05 ENCOUNTER — Encounter: Payer: Self-pay | Admitting: Internal Medicine

## 2020-03-05 ENCOUNTER — Other Ambulatory Visit: Payer: Self-pay

## 2020-03-05 VITALS — BP 152/78 | HR 85 | Temp 98.2°F | Ht 67.0 in | Wt 137.0 lb

## 2020-03-05 DIAGNOSIS — K5900 Constipation, unspecified: Secondary | ICD-10-CM | POA: Diagnosis not present

## 2020-03-05 DIAGNOSIS — M7989 Other specified soft tissue disorders: Secondary | ICD-10-CM | POA: Diagnosis not present

## 2020-03-05 NOTE — Patient Instructions (Addendum)
Your abdominal symptoms are likely related to constipation.  Take miralax twice day for three days then take it daily.  Continue the fiber supplement daily.      Lets get an ultrasound of your left leg to rule out a blood clot.

## 2020-03-05 NOTE — Assessment & Plan Note (Signed)
Acute started approximately 3 weeks ago-1 week after twisting her ankle She is experiencing heel pain and left groin pain Swelling improves after laying down all night Swelling possibly related to sprain ankle were small fracture and foot or heel Need to rule out DVT given mild trauma and leg swelling We will get an ultrasound of the leg first-if negative may need x-rays, but x-ray is not available today so we will hold off on that today Further evaluation and treatment depending on ultrasound

## 2020-03-05 NOTE — Assessment & Plan Note (Signed)
Chronic in nature with worsening recently Take MiraLAX twice daily for 2-3 days and then daily after that daily continue fiber daily  Call if no improvement

## 2020-03-06 ENCOUNTER — Ambulatory Visit (HOSPITAL_COMMUNITY)
Admission: RE | Admit: 2020-03-06 | Discharge: 2020-03-06 | Disposition: A | Discharge status: Home / Self Care | Payer: Medicare PPO | Source: Ambulatory Visit | Attending: Cardiology | Admitting: Cardiology

## 2020-03-06 DIAGNOSIS — M7989 Other specified soft tissue disorders: Secondary | ICD-10-CM | POA: Diagnosis not present

## 2020-03-08 ENCOUNTER — Telehealth: Payer: Self-pay

## 2020-03-08 NOTE — Telephone Encounter (Signed)
Spoke with patient and info given.  She currently sees Dr. Maxie Better and will follow up with him.

## 2020-03-08 NOTE — Telephone Encounter (Signed)
New message    Calling for test results  

## 2020-03-08 NOTE — Telephone Encounter (Signed)
No DVT seen on ultrasound.  Most likely swelling is orthopedic in nature.  Can refer to sports medicine or an orthopedic for further evaluation.

## 2020-03-11 DIAGNOSIS — R2242 Localized swelling, mass and lump, left lower limb: Secondary | ICD-10-CM | POA: Diagnosis not present

## 2020-03-11 DIAGNOSIS — M79672 Pain in left foot: Secondary | ICD-10-CM | POA: Diagnosis not present

## 2020-03-11 DIAGNOSIS — M25572 Pain in left ankle and joints of left foot: Secondary | ICD-10-CM | POA: Diagnosis not present

## 2020-03-18 DIAGNOSIS — M5489 Other dorsalgia: Secondary | ICD-10-CM | POA: Diagnosis not present

## 2020-03-18 DIAGNOSIS — M4854XD Collapsed vertebra, not elsewhere classified, thoracic region, subsequent encounter for fracture with routine healing: Secondary | ICD-10-CM | POA: Diagnosis not present

## 2020-03-18 DIAGNOSIS — M6281 Muscle weakness (generalized): Secondary | ICD-10-CM | POA: Diagnosis not present

## 2020-03-18 DIAGNOSIS — R2681 Unsteadiness on feet: Secondary | ICD-10-CM | POA: Diagnosis not present

## 2020-03-18 DIAGNOSIS — R293 Abnormal posture: Secondary | ICD-10-CM | POA: Diagnosis not present

## 2020-03-18 DIAGNOSIS — M2569 Stiffness of other specified joint, not elsewhere classified: Secondary | ICD-10-CM | POA: Diagnosis not present

## 2020-03-18 DIAGNOSIS — R2689 Other abnormalities of gait and mobility: Secondary | ICD-10-CM | POA: Diagnosis not present

## 2020-03-19 DIAGNOSIS — E1042 Type 1 diabetes mellitus with diabetic polyneuropathy: Secondary | ICD-10-CM | POA: Diagnosis not present

## 2020-03-19 DIAGNOSIS — E063 Autoimmune thyroiditis: Secondary | ICD-10-CM | POA: Diagnosis not present

## 2020-03-19 DIAGNOSIS — E038 Other specified hypothyroidism: Secondary | ICD-10-CM | POA: Diagnosis not present

## 2020-03-25 ENCOUNTER — Ambulatory Visit: Payer: Medicare PPO

## 2020-03-25 NOTE — Patient Instructions (Addendum)
  Take tylenol for your neck/headache.  You can apply heat, ice.    Monitor your BP at home.    Medications reviewed and updated.  Changes include :   none

## 2020-03-25 NOTE — Progress Notes (Signed)
Subjective:    Patient ID: [name], female    DOB: 1929-04-21, 86 y.o.   MRN: [id number]  HPI The patient is here for follow up of their chronic medical problems, including htn, DM 1, hypothyroidism, constipation, hyperlipidemia, B12 def.   She is taking all of her medications as prescribed.    For the past two days she has had pain in the back of her head.  It is on the left side of her head.  Just before this started she had left sided neck pain and it hurt more to turn left.  That has improved.  She did take Tylenol and it improved her pain.  Her bp at home has been elevated - it is usually well controlled.  It is only been elevated for the past couple of days.  It has been in the 150s and 160s, which is unusual.  She was unsure if that was causing the headache.  Her constipation did improved after taking MiraLAX for several days.  She feels her constipation is better controlled now and is taking her fiber daily.  She states a dry cough, some blurry vision and dizziness that she thinks may be associated with the headaches.  She does have a mild sore throat, nasal congestion and fatigue.  She denies fevers.  She has a dry cough at times, but denies any shortness of breath.  She states she just does not feel well and most of this started yesterday, but the blood pressure started 2 days ago.  She has had the Covid vaccines.  She has been wearing her mask whenever she goes out.  Medications and allergies reviewed with patient and updated if appropriate.  Patient Active Problem List   Diagnosis Date Noted  . Left leg swelling 03/05/2020  . UTI (urinary tract infection) 12/15/2019  . B12 deficiency 12/15/2019  . Tingling in extremities 10/04/2019  . Urinary frequency 08/16/2019  . Chronic thoracic back pain 08/16/2019  . Atrial fibrillation ([place]) 02/01/2018  . Leg cramps 07/05/2017  . Constipation 01/25/2017  . Closed fracture of proximal end of right humerus  07/09/2016  . Cough 06/17/2016  . Abdominal pain 03/10/2016  . Hearing loss 01/03/2016  . Allergic rhinitis 01/03/2016  . Minimal Coronary Plaque by Cardiac Cath in 2013 09/11/2015  . Palpitations 09/06/2015  . Frequent loose stools 08/28/2015  . Hyponatremia   . Acute mesenteric ischemia ([place])   . Hypothyroidism 07/11/2012  . Syncope 03/18/2012  . Hypercholesteremia   . Hypertension   . Diabetes mellitus type 1 ([place])   . Osteoarthritis   . Thyroid nodule   . Osteoporosis, post-menopausal   . Primary cancer of upper outer quadrant of left female breast ([place]) 08/21/2011    Current Outpatient Medications on File Prior to Visit  Medication Sig Dispense Refill  . ALPRAZolam (XANAX) 0.5 MG tablet TAKE 1/2 TABLET TWICE DAILY AS NEEDED FOR ANXIETY OR SLEEP 30 tablet 0  . aspirin EC 81 MG tablet Take 81 mg by mouth daily.    . B Complex-C (SUPER B COMPLEX PO) Take 1 tablet by mouth daily.    . BD VEO INSULIN SYRINGE U/F 31G X 15/64" 0.3 ML MISC USE WITH INJECTIONS 100 each 2  . bisacodyl (DULCOLAX) 10 MG suppository Place 1 suppository (10 mg total) rectally as needed for moderate constipation. 12 suppository 0  . carboxymethylcellulose (REFRESH PLUS) 0.5 % SOLN Place 1 drop into both eyes 3 (three) times daily as needed. Non-preservative    .  cholecalciferol (VITAMIN D) 1000 UNITS tablet Take 5,000 Units by mouth daily.     . Continuous Blood Gluc Sensor ([employer]) MISC by Does not apply route. Sensor is located in Right arm    . fish oil-omega-3 fatty acids 1000 MG capsule Take 2 g by mouth daily.    [name] glucose blood test strip OneTouch Ultra [employer]    . Insulin Glargine (LANTUS SOLOSTAR) 100 UNIT/ML Solostar Pen Inject 14 Units into the skin daily at 10 pm.     . insulin lispro (HUMALOG KWIKPEN) 100 UNIT/ML KiwkPen Inject into the skin. Sliding scale before meal    . Insulin Pen Needle (BD PEN NEEDLE NANO 2ND GEN) 32G X 4 MM MISC INJECT AS DIRECTED FOUR  TIMES A DAY    . levothyroxine (SYNTHROID) 112 MCG tablet     . losartan (COZAAR) 100 MG tablet Take 1 tablet (100 mg total) by mouth daily. 90 tablet 0  . metoprolol succinate (TOPROL-XL) 25 MG 24 hr tablet Take 0.5 tablets (12.5 mg total) by mouth daily as needed. For palpitations. 45 tablet 3  . Multiple Vitamin (MULTIVITAMIN) tablet Take 1 tablet by mouth daily. For breast and bone health    . phenazopyridine (PYRIDIUM) 200 MG tablet Take 1 tablet (200 mg total) by mouth 3 (three) times daily as needed for pain. 6 tablet 0  . saccharomyces boulardii (FLORASTOR) 250 MG capsule Take 1 capsule (250 mg total) by mouth 2 (two) times daily. 60 capsule 5  . simvastatin (ZOCOR) 20 MG tablet TAKE ONE TABLET AT BEDTIME. 90 tablet 1  . SYNTHROID 100 MCG tablet Take 1 tablet (100 mcg total) by mouth daily before breakfast.     No current facility-administered medications on file prior to visit.    Past Medical History:  Diagnosis Date  . Anxiety   . Arthritis    fingers  . Bowel obstruction ([place])   . Cancer of upper-outer quadrant of female breast ([place]) 08/21/2011   ER +  PR +  Her 2 -  Ki67 9%   0.9 cm invasive lobular  Carcinoma ,s/p central lumpectomy with sentinel node biopsy,  ER/PR positive s/p re-excion on 09/16/11 with final pathology showing atypical hyperplasia   . CAP (community acquired pneumonia) 12/06/2016  . Diabetes mellitus type 1 ([facility])    type I- wears insulin pump  . GERD (gastroesophageal reflux disease)   . History of small bowel obstruction   . Hypercholesteremia   . Hypergastrinemia   . Hypertension   . Hypertension   . Hyponatremia   . Hypothyroid   . Hypothyroidism   . IBS (irritable bowel syndrome)   . Osteoarthritis   . Osteoporosis, post-menopausal    DEXA 12/02/11: -3.5 (with lomax gyn), declines bisphos due to bone pain  . PAF (paroxysmal atrial fibrillation) ([place])    One episode in 2008, spontaneously converted in the ED, no further treatment  . Spondylosis     thoracic spine  . Thyroid nodule dx 07/2011   [place] q 67moto follow calcified L thyroid nodule (12/08/11 [place]  . Umbilical hernia     Past Surgical History:  Procedure Laterality Date  . ABDOMINAL HYSTERECTOMY    . ABDOMINAL HYSTERECTOMY  yrs ago  . APPENDECTOMY    . APPENDECTOMY  age 86 . BLADDER REPAIR  5 yrs ago  . BREAST LUMPECTOMY    . BREAST SURGERY     biopsy  . BREAST SURGERY   yrs ago  br bx  . BREAST SURGERY  01/ 23/2013   left central lumpectomy and snbx, re-excision lumpectomy  . CARDIOVASCULAR STRESS TEST  03/17/2012  . colon surgery     Small intestines- took 6 inches out  . COLONOSCOPY  07/11/2015   [facility] Dr Derrill Kay  . ESOPHAGOGASTRODUODENOSCOPY  07/17/2010   [facility]  . LEFT HEART CATHETERIZATION WITH CORONARY ANGIOGRAM N/A 03/18/2012   Procedure: LEFT HEART CATHETERIZATION WITH CORONARY ANGIOGRAM;  Surgeon: Minus Breeding, MD;  Location: [facility] CATH LAB;  Service: Cardiovascular;  Laterality: N/A;  . TONSILLECTOMY   age 31    Social History   Socioeconomic History  . Marital status: Married    Spouse name: Not on file  . Number of children: Not on file  . Years of education: Not on file  . Highest education level: Not on file  Occupational History  . Not on file  Tobacco Use  . Smoking status: Former Smoker    Packs/day: 1.00    Years: 10.00    Pack years: 10.00    Types: Cigarettes    Quit date: 08/27/1977    Years since quitting: 42.6  . Smokeless tobacco: Never Used  Vaping Use  . Vaping Use: Never used  Substance and Sexual Activity  . Alcohol use: Yes    Comment: rare  . Drug use: No  . Sexual activity: Yes    Birth control/protection: Post-menopausal  Other Topics Concern  . Not on file  Social History Narrative   ** Merged History Encounter **       Social Determinants of Health   Financial Resource Strain:   . Difficulty of Paying Living Expenses:   Food Insecurity:   . Worried About Charity fundraiser in the Last Year:    . Arboriculturist in the Last Year:   Transportation Needs:   . Film/video editor (Medical):   [name] Lack of Transportation (Non-Medical):   Physical Activity:   . Days of Exercise per Week:   . Minutes of Exercise per Session:   Stress:   . Feeling of Stress :   Social Connections:   . Frequency of Communication with Friends and Family:   . Frequency of Social Gatherings with Friends and Family:   . Attends Religious Services:   . Active Member of Clubs or Organizations:   . Attends Archivist Meetings:   [name] Marital Status:     Family History  Problem Relation Age of Onset  . Cancer Sister        unknown  . Hypertension Mother   . Stroke Mother   . Arthritis Father   . Heart disease Father   . Diabetes Son   . Colon cancer Neg Hx   . Esophageal cancer Neg Hx     Review of Systems  Constitutional: Positive for fatigue (not new - has been that for a while). Negative for chills (occ) and fever.  HENT: Positive for congestion and sore throat (mild on left side). Negative for ear pain, sinus pain and sneezing.   Eyes: Positive for visual disturbance (eyes not focusing).  Respiratory: Positive for cough (dry). Negative for shortness of breath and wheezing.   Cardiovascular: Positive for palpitations (occ). Negative for chest pain and leg swelling.  Gastrointestinal: Positive for constipation and nausea.  Musculoskeletal: Positive for arthralgias (knees), back pain and neck pain.  Neurological: Positive for dizziness and headaches (in eyes and left posterior head). Negative for light-headedness.  Objective:   Vitals:   03/26/20 1405  BP: (!) 152/80  Pulse: 89  Temp: 98.4 F (36.9 C)  SpO2: 94%   BP Readings from Last 3 Encounters:  03/26/20 (!) 152/80  03/05/20 (!) 152/78  12/15/19 (!) 156/72   Wt Readings from Last 3 Encounters:  03/26/20 134 lb (60.8 kg)  03/05/20 137 lb (62.1 kg)  12/15/19 138 lb (62.6 kg)   Body mass index is 20.99  kg/m.   Physical Exam    Constitutional: Appears well-developed and well-nourished. No distress.  HENT:  Head: Normocephalic and atraumatic. Ears: Bilateral ear canals and tympanic membranes normal Oropharynx: Deferred Neck: Neck supple. No tracheal deviation present. No thyromegaly present.  No cervical lymphadenopathy Cardiovascular: Normal rate, regular rhythm and normal heart sounds.   No murmur heard. No carotid bruit .  No edema Pulmonary/Chest: Effort normal and breath sounds normal. No respiratory distress. No has no wheezes. No rales.  Skin: Skin is warm and dry. Not diaphoretic.  Psychiatric: Normal mood and affect. Behavior is normal.      Assessment & Plan:    See Problem List for Assessment and Plan of chronic medical problems.    This visit occurred during the SARS-CoV-2 public health emergency.  Safety protocols were in place, including screening questions prior to the visit, additional usage of staff PPE, and extensive cleaning of exam room while observing appropriate contact time as indicated for disinfecting solutions.

## 2020-03-26 ENCOUNTER — Ambulatory Visit: Payer: Medicare PPO | Admitting: Internal Medicine

## 2020-03-26 ENCOUNTER — Other Ambulatory Visit: Payer: Self-pay

## 2020-03-26 ENCOUNTER — Encounter: Payer: Self-pay | Admitting: Internal Medicine

## 2020-03-26 VITALS — BP 152/80 | HR 89 | Temp 98.4°F | Ht 67.0 in | Wt 134.0 lb

## 2020-03-26 DIAGNOSIS — K5900 Constipation, unspecified: Secondary | ICD-10-CM | POA: Diagnosis not present

## 2020-03-26 DIAGNOSIS — J069 Acute upper respiratory infection, unspecified: Secondary | ICD-10-CM

## 2020-03-26 DIAGNOSIS — E039 Hypothyroidism, unspecified: Secondary | ICD-10-CM

## 2020-03-26 DIAGNOSIS — I10 Essential (primary) hypertension: Secondary | ICD-10-CM

## 2020-03-26 DIAGNOSIS — E7800 Pure hypercholesterolemia, unspecified: Secondary | ICD-10-CM | POA: Diagnosis not present

## 2020-03-26 DIAGNOSIS — E1043 Type 1 diabetes mellitus with diabetic autonomic (poly)neuropathy: Secondary | ICD-10-CM | POA: Diagnosis not present

## 2020-03-26 DIAGNOSIS — E538 Deficiency of other specified B group vitamins: Secondary | ICD-10-CM

## 2020-03-26 NOTE — Assessment & Plan Note (Signed)
Acute Some symptoms consistent with possible viral URI She has had both Covid vaccines, but concern for possible Covid-I did to schedule her for a test tomorrow at [facility] near where she lives Advised her to let me know the results as soon as it is back Symptomatic treatment Call if symptoms worsen

## 2020-03-26 NOTE — Assessment & Plan Note (Signed)
Chronic Blood pressure elevated for the past couple of days at home-it typically is higher here At this point we will just monitor since it was low a couple of months ago.  Sounds like her elevated blood pressure is related to her acute illness Continue current medications

## 2020-03-26 NOTE — Assessment & Plan Note (Signed)
Chronic Appears euvolemic Continue current dose of levothyroxine

## 2020-03-26 NOTE — Assessment & Plan Note (Signed)
Chronic Continue fiber daily-stressed the importance of taking this on a daily basis MiraLAX daily as needed

## 2020-03-26 NOTE — Assessment & Plan Note (Signed)
Chronic. Continue simvastatin. 

## 2020-03-26 NOTE — Assessment & Plan Note (Signed)
Chronic Following with endocrine Sugars have been controlled

## 2020-03-26 NOTE — Assessment & Plan Note (Signed)
Chronic Should get B12 injections daily, but will hold off today given current cold symptoms

## 2020-03-28 DIAGNOSIS — E1065 Type 1 diabetes mellitus with hyperglycemia: Secondary | ICD-10-CM | POA: Diagnosis not present

## 2020-03-29 ENCOUNTER — Encounter: Payer: Self-pay | Admitting: Internal Medicine

## 2020-03-29 ENCOUNTER — Telehealth: Payer: Self-pay

## 2020-03-29 DIAGNOSIS — F411 Generalized anxiety disorder: Secondary | ICD-10-CM

## 2020-03-29 DIAGNOSIS — Z20828 Contact with and (suspected) exposure to other viral communicable diseases: Secondary | ICD-10-CM | POA: Diagnosis not present

## 2020-03-29 NOTE — Telephone Encounter (Signed)
New message    COVID testing results - negative

## 2020-03-29 NOTE — Telephone Encounter (Signed)
Message sent via my chart

## 2020-03-29 NOTE — Telephone Encounter (Signed)
Great - please just see how she is feeling - is she still having symptoms.  I have spoken to a few people these past few days with cold symptoms that are not covid.    Is her headache better

## 2020-04-01 MED ORDER — ALPRAZOLAM 0.5 MG PO TABS: ORAL_TABLET | ORAL | 5 refills | Status: DC

## 2020-04-09 DIAGNOSIS — R2681 Unsteadiness on feet: Secondary | ICD-10-CM | POA: Diagnosis not present

## 2020-04-09 DIAGNOSIS — M5489 Other dorsalgia: Secondary | ICD-10-CM | POA: Diagnosis not present

## 2020-04-09 DIAGNOSIS — M4854XD Collapsed vertebra, not elsewhere classified, thoracic region, subsequent encounter for fracture with routine healing: Secondary | ICD-10-CM | POA: Diagnosis not present

## 2020-04-09 DIAGNOSIS — R293 Abnormal posture: Secondary | ICD-10-CM | POA: Diagnosis not present

## 2020-04-09 DIAGNOSIS — R2689 Other abnormalities of gait and mobility: Secondary | ICD-10-CM | POA: Diagnosis not present

## 2020-04-09 DIAGNOSIS — M6281 Muscle weakness (generalized): Secondary | ICD-10-CM | POA: Diagnosis not present

## 2020-04-09 DIAGNOSIS — M2569 Stiffness of other specified joint, not elsewhere classified: Secondary | ICD-10-CM | POA: Diagnosis not present

## 2020-04-11 ENCOUNTER — Other Ambulatory Visit: Payer: Self-pay | Admitting: Internal Medicine

## 2020-04-17 DIAGNOSIS — R2681 Unsteadiness on feet: Secondary | ICD-10-CM | POA: Diagnosis not present

## 2020-04-17 DIAGNOSIS — R2689 Other abnormalities of gait and mobility: Secondary | ICD-10-CM | POA: Diagnosis not present

## 2020-04-17 DIAGNOSIS — R293 Abnormal posture: Secondary | ICD-10-CM | POA: Diagnosis not present

## 2020-04-17 DIAGNOSIS — M4854XD Collapsed vertebra, not elsewhere classified, thoracic region, subsequent encounter for fracture with routine healing: Secondary | ICD-10-CM | POA: Diagnosis not present

## 2020-04-17 DIAGNOSIS — M2569 Stiffness of other specified joint, not elsewhere classified: Secondary | ICD-10-CM | POA: Diagnosis not present

## 2020-04-17 DIAGNOSIS — M5489 Other dorsalgia: Secondary | ICD-10-CM | POA: Diagnosis not present

## 2020-04-17 DIAGNOSIS — M6281 Muscle weakness (generalized): Secondary | ICD-10-CM | POA: Diagnosis not present

## 2020-04-19 DIAGNOSIS — M5489 Other dorsalgia: Secondary | ICD-10-CM | POA: Diagnosis not present

## 2020-04-19 DIAGNOSIS — M6281 Muscle weakness (generalized): Secondary | ICD-10-CM | POA: Diagnosis not present

## 2020-04-19 DIAGNOSIS — M2569 Stiffness of other specified joint, not elsewhere classified: Secondary | ICD-10-CM | POA: Diagnosis not present

## 2020-04-19 DIAGNOSIS — M4854XD Collapsed vertebra, not elsewhere classified, thoracic region, subsequent encounter for fracture with routine healing: Secondary | ICD-10-CM | POA: Diagnosis not present

## 2020-04-19 DIAGNOSIS — R2689 Other abnormalities of gait and mobility: Secondary | ICD-10-CM | POA: Diagnosis not present

## 2020-04-19 DIAGNOSIS — R2681 Unsteadiness on feet: Secondary | ICD-10-CM | POA: Diagnosis not present

## 2020-04-19 DIAGNOSIS — R293 Abnormal posture: Secondary | ICD-10-CM | POA: Diagnosis not present

## 2020-04-23 DIAGNOSIS — M4854XD Collapsed vertebra, not elsewhere classified, thoracic region, subsequent encounter for fracture with routine healing: Secondary | ICD-10-CM | POA: Diagnosis not present

## 2020-04-23 DIAGNOSIS — R293 Abnormal posture: Secondary | ICD-10-CM | POA: Diagnosis not present

## 2020-04-23 DIAGNOSIS — R2681 Unsteadiness on feet: Secondary | ICD-10-CM | POA: Diagnosis not present

## 2020-04-23 DIAGNOSIS — R2689 Other abnormalities of gait and mobility: Secondary | ICD-10-CM | POA: Diagnosis not present

## 2020-04-23 DIAGNOSIS — M5489 Other dorsalgia: Secondary | ICD-10-CM | POA: Diagnosis not present

## 2020-04-23 DIAGNOSIS — M2569 Stiffness of other specified joint, not elsewhere classified: Secondary | ICD-10-CM | POA: Diagnosis not present

## 2020-04-23 DIAGNOSIS — M6281 Muscle weakness (generalized): Secondary | ICD-10-CM | POA: Diagnosis not present

## 2020-04-25 DIAGNOSIS — I152 Hypertension secondary to endocrine disorders: Secondary | ICD-10-CM | POA: Diagnosis not present

## 2020-04-25 DIAGNOSIS — E063 Autoimmune thyroiditis: Secondary | ICD-10-CM | POA: Diagnosis not present

## 2020-04-25 DIAGNOSIS — E785 Hyperlipidemia, unspecified: Secondary | ICD-10-CM | POA: Diagnosis not present

## 2020-04-25 DIAGNOSIS — E1159 Type 2 diabetes mellitus with other circulatory complications: Secondary | ICD-10-CM | POA: Diagnosis not present

## 2020-04-25 DIAGNOSIS — R200 Anesthesia of skin: Secondary | ICD-10-CM | POA: Diagnosis not present

## 2020-04-25 DIAGNOSIS — E1069 Type 1 diabetes mellitus with other specified complication: Secondary | ICD-10-CM | POA: Diagnosis not present

## 2020-04-25 DIAGNOSIS — E038 Other specified hypothyroidism: Secondary | ICD-10-CM | POA: Diagnosis not present

## 2020-04-25 DIAGNOSIS — E1042 Type 1 diabetes mellitus with diabetic polyneuropathy: Secondary | ICD-10-CM | POA: Diagnosis not present

## 2020-04-30 DIAGNOSIS — R293 Abnormal posture: Secondary | ICD-10-CM | POA: Diagnosis not present

## 2020-04-30 DIAGNOSIS — M5489 Other dorsalgia: Secondary | ICD-10-CM | POA: Diagnosis not present

## 2020-04-30 DIAGNOSIS — R2689 Other abnormalities of gait and mobility: Secondary | ICD-10-CM | POA: Diagnosis not present

## 2020-04-30 DIAGNOSIS — M4854XD Collapsed vertebra, not elsewhere classified, thoracic region, subsequent encounter for fracture with routine healing: Secondary | ICD-10-CM | POA: Diagnosis not present

## 2020-04-30 DIAGNOSIS — M6281 Muscle weakness (generalized): Secondary | ICD-10-CM | POA: Diagnosis not present

## 2020-04-30 DIAGNOSIS — M2569 Stiffness of other specified joint, not elsewhere classified: Secondary | ICD-10-CM | POA: Diagnosis not present

## 2020-04-30 DIAGNOSIS — R2681 Unsteadiness on feet: Secondary | ICD-10-CM | POA: Diagnosis not present

## 2020-05-06 DIAGNOSIS — M4854XD Collapsed vertebra, not elsewhere classified, thoracic region, subsequent encounter for fracture with routine healing: Secondary | ICD-10-CM | POA: Diagnosis not present

## 2020-05-06 DIAGNOSIS — M2569 Stiffness of other specified joint, not elsewhere classified: Secondary | ICD-10-CM | POA: Diagnosis not present

## 2020-05-06 DIAGNOSIS — R2689 Other abnormalities of gait and mobility: Secondary | ICD-10-CM | POA: Diagnosis not present

## 2020-05-06 DIAGNOSIS — M5489 Other dorsalgia: Secondary | ICD-10-CM | POA: Diagnosis not present

## 2020-05-06 DIAGNOSIS — R2681 Unsteadiness on feet: Secondary | ICD-10-CM | POA: Diagnosis not present

## 2020-05-06 DIAGNOSIS — M6281 Muscle weakness (generalized): Secondary | ICD-10-CM | POA: Diagnosis not present

## 2020-05-06 DIAGNOSIS — R293 Abnormal posture: Secondary | ICD-10-CM | POA: Diagnosis not present

## 2020-06-07 DIAGNOSIS — E063 Autoimmune thyroiditis: Secondary | ICD-10-CM | POA: Diagnosis not present

## 2020-06-07 DIAGNOSIS — E038 Other specified hypothyroidism: Secondary | ICD-10-CM | POA: Diagnosis not present

## 2020-06-17 ENCOUNTER — Telehealth: Payer: Self-pay | Admitting: Internal Medicine

## 2020-06-17 NOTE — Telephone Encounter (Signed)
Pt had her Thyroid levels checked at her endocrinologist last week and her Thyroid levels were off. The Endocrinologist advised the patient to see her Cardiologist.  The pt wants to make sure Dr. Lovena Le got those results. She is scheduled to see Oda Kilts 06/20/20

## 2020-06-19 DIAGNOSIS — B351 Tinea unguium: Secondary | ICD-10-CM | POA: Diagnosis not present

## 2020-06-19 DIAGNOSIS — L603 Nail dystrophy: Secondary | ICD-10-CM | POA: Diagnosis not present

## 2020-06-19 DIAGNOSIS — E109 Type 1 diabetes mellitus without complications: Secondary | ICD-10-CM | POA: Diagnosis not present

## 2020-06-19 NOTE — Progress Notes (Signed)
PCP:  Binnie Rail, MD Primary Cardiologist: No primary care provider on file. Electrophysiologist: Cristopher Peru, MD   [name] is a 86 y.o. female with h/o breast CA, PAF, and not on [place] due to h/o bleeding, mild carotid stenosis by [place] who is seen today for Cristopher Peru, MD for acute visit due to elevated TSH per Endocrinologist request. .  Since last being seen in our clinic the patient reports doing well. She has palpitations on a daily basis, but these are all short lived and minimally symptomatic. No sustained atrial fibrillation that she has been aware of. she denies chest pain, dyspnea, PND, orthopnea, nausea, vomiting, dizziness, syncope, edema, weight gain, or early satiety. She has not needed to use prn toprol.   Past Medical History:  Diagnosis Date  . Anxiety   . Arthritis    fingers  . Bowel obstruction ([place])   . Cancer of upper-outer quadrant of female breast ([place]) 08/21/2011   ER +  PR +  Her 2 -  Ki67 9%   0.9 cm invasive lobular  Carcinoma ,s/p central lumpectomy with sentinel node biopsy,  ER/PR positive s/p re-excion on 09/16/11 with final pathology showing atypical hyperplasia   . CAP (community acquired pneumonia) 12/06/2016  . Diabetes mellitus type 1 (HCC)    type I- wears insulin pump  . GERD (gastroesophageal reflux disease)   . History of small bowel obstruction   . Hypercholesteremia   . Hypergastrinemia   . Hypertension   . Hypertension   . Hyponatremia   . Hypothyroid   . Hypothyroidism   . IBS (irritable bowel syndrome)   . Osteoarthritis   . Osteoporosis, post-menopausal    DEXA 12/02/11: -3.5 (with lomax gyn), declines bisphos due to bone pain  . PAF (paroxysmal atrial fibrillation) ([place])    One episode in 2008, spontaneously converted in the ED, no further treatment  . Spondylosis    thoracic spine  . Thyroid nodule dx 07/2011   [place] q 60moto follow calcified L thyroid nodule (12/08/11 [place]  . Umbilical hernia    Past Surgical  History:  Procedure Laterality Date  . ABDOMINAL HYSTERECTOMY    . ABDOMINAL HYSTERECTOMY  yrs ago  . APPENDECTOMY    . APPENDECTOMY  age 86 . BLADDER REPAIR  5 yrs ago  . BREAST LUMPECTOMY    . BREAST SURGERY     biopsy  . BREAST SURGERY   yrs ago   br bx  . BREAST SURGERY  01/ 23/2013   left central lumpectomy and snbx, re-excision lumpectomy  . CARDIOVASCULAR STRESS TEST  03/17/2012  . colon surgery     Small intestines- took 6 inches out  . COLONOSCOPY  07/11/2015   [facility] Dr KDerrill Kay . ESOPHAGOGASTRODUODENOSCOPY  07/17/2010   [facility]  . LEFT HEART CATHETERIZATION WITH CORONARY ANGIOGRAM N/A 03/18/2012   Procedure: LEFT HEART CATHETERIZATION WITH CORONARY ANGIOGRAM;  Surgeon: JMinus Breeding MD;  [facility];  Service: Cardiovascular;  Laterality: N/A;  . TONSILLECTOMY   age 86   Current Outpatient Medications  Medication Sig Dispense Refill  . ALPRAZolam (XANAX) 0.5 MG tablet TAKE 1/2 TABLET TWICE DAILY AS NEEDED FOR ANXIETY OR SLEEP 30 tablet 5  . aspirin EC 81 MG tablet Take 81 mg by mouth daily.    . B Complex-C (SUPER B COMPLEX PO) Take 1 tablet by mouth daily.    . BD VEO INSULIN SYRINGE U/F 31G X 15/64" 0.3  ML MISC USE WITH INJECTIONS 100 each 2  . bisacodyl (DULCOLAX) 10 MG suppository Place 1 suppository (10 mg total) rectally as needed for moderate constipation. 12 suppository 0  . carboxymethylcellulose (REFRESH PLUS) 0.5 % SOLN Place 1 drop into both eyes 3 (three) times daily as needed. Non-preservative    . cholecalciferol (VITAMIN D) 1000 UNITS tablet Take 5,000 Units by mouth daily.     . Continuous Blood Gluc Sensor ([place]) MISC by Does not apply route. Sensor is located in Right arm    . fish oil-omega-3 fatty acids 1000 MG capsule Take 2 g by mouth daily.    [name] glucose blood test strip OneTouch Ultra Blue Test Strip    . hydrochlorothiazide (HYDRODIURIL) 25 MG tablet TAKE (1) TABLET DAILY AS NEEDED. 90 tablet 0  .  Insulin Glargine (LANTUS SOLOSTAR) 100 UNIT/ML Solostar Pen Inject 14 Units into the skin daily at 10 pm.     . insulin lispro (HUMALOG KWIKPEN) 100 UNIT/ML KiwkPen Inject into the skin. Sliding scale before meal    . Insulin Pen Needle (BD PEN NEEDLE NANO 2ND GEN) 32G X 4 MM MISC INJECT AS DIRECTED FOUR TIMES A DAY    . levothyroxine (SYNTHROID) 112 MCG tablet     . losartan (COZAAR) 100 MG tablet Take 1 tablet (100 mg total) by mouth daily. 90 tablet 0  . metoprolol succinate (TOPROL-XL) 25 MG 24 hr tablet Take 0.5 tablets (12.5 mg total) by mouth daily as needed. For palpitations. 45 tablet 3  . Multiple Vitamin (MULTIVITAMIN) tablet Take 1 tablet by mouth daily. For breast and bone health    . phenazopyridine (PYRIDIUM) 200 MG tablet Take 1 tablet (200 mg total) by mouth 3 (three) times daily as needed for pain. 6 tablet 0  . saccharomyces boulardii (FLORASTOR) 250 MG capsule Take 1 capsule (250 mg total) by mouth 2 (two) times daily. (Patient taking differently: Take 250 mg by mouth as needed. ) 60 capsule 5  . simvastatin (ZOCOR) 20 MG tablet TAKE ONE TABLET AT BEDTIME. 90 tablet 1   No current facility-administered medications for this visit.    Allergies  Allergen Reactions  . Eliquis [Apixaban] Other (See Comments)    Dizziness, severe headach  . Amlodipine     Dizzy, nausea  . Augmentin [Amoxicillin-Pot Clavulanate] Other (See Comments)    Gi upset only no rash or hives   . Cortisone Other (See Comments)    Makes blood sugars run high  . Keflex [Cephalexin] Hives and Nausea Only    Stomach upset  . Omeprazole Nausea Only    Dizziness and nausea  . Prednisone     Other reaction(s): Other Makes blood sugars run high  . Hydrocodone Rash    Rash to chest , side back  . Latex Rash    Powder in gloves causes rash; "not allergic to latex just the powder"    Social History   Socioeconomic History  . Marital status: Married    Spouse name: Not on file  . Number of  children: Not on file  . Years of education: Not on file  . Highest education level: Not on file  Occupational History  . Not on file  Tobacco Use  . Smoking status: Former Smoker    Packs/day: 1.00    Years: 10.00    Pack years: 10.00    Types: Cigarettes    Quit date: 08/27/1977    Years since quitting: 42.8  . Smokeless  tobacco: Never Used  Vaping Use  . Vaping Use: Never used  Substance and Sexual Activity  . Alcohol use: Yes    Comment: rare  . Drug use: No  . Sexual activity: Yes    Birth control/protection: Post-menopausal  Other Topics Concern  . Not on file  Social History Narrative   ** Merged History Encounter **       Social Determinants of Health   Financial Resource Strain:   . Difficulty of Paying Living Expenses: Not on file  Food Insecurity:   . Worried About Charity fundraiser in the Last Year: Not on file  . Ran Out of Food in the Last Year: Not on file  Transportation Needs:   . Lack of Transportation (Medical): Not on file  . Lack of Transportation (Non-Medical): Not on file  Physical Activity:   . Days of Exercise per Week: Not on file  . Minutes of Exercise per Session: Not on file  Stress:   . Feeling of Stress : Not on file  Social Connections:   . Frequency of Communication with Friends and Family: Not on file  . Frequency of Social Gatherings with Friends and Family: Not on file  . Attends Religious Services: Not on file  . Active Member of Clubs or Organizations: Not on file  . Attends Archivist Meetings: Not on file  . Marital Status: Not on file  Intimate Partner Violence:   . Fear of Current or Ex-Partner: Not on file  . Emotionally Abused: Not on file  . Physically Abused: Not on file  . Sexually Abused: Not on file     Review of Systems: General: No chills, fever, night sweats or weight changes  Cardiovascular:  No chest pain, dyspnea on exertion, edema, orthopnea, palpitations, paroxysmal nocturnal  dyspnea Dermatological: No rash, lesions or masses Respiratory: No cough, dyspnea Urologic: No hematuria, dysuria Abdominal: No nausea, vomiting, diarrhea, bright red blood per rectum, melena, or hematemesis Neurologic: No visual changes, weakness, changes in mental status All other systems reviewed and are otherwise negative except as noted above.  Physical Exam: Vitals:   06/20/20 1048  BP: 130/70  Pulse: 78  SpO2: 96%  Weight: 134 lb (60.8 kg)  Height: _0  (1.702 m)    GEN- The patient is well appearing, alert and oriented x 3 today.   HEENT: normocephalic, atraumatic; sclera clear, conjunctiva pink; hearing intact; oropharynx clear; neck supple, no JVP Lymph- no cervical lymphadenopathy Lungs- Clear to ausculation bilaterally, normal work of breathing.  No wheezes, rales, rhonchi Heart- Regular rate and rhythm, no murmurs, rubs or gallops, PMI not laterally displaced GI- soft, non-tender, non-distended, bowel sounds present, no hepatosplenomegaly Extremities- no clubbing, cyanosis, or edema; DP/PT/radial pulses 2+ bilaterally MS- no significant deformity or atrophy Skin- warm and dry, no rash or lesion Psych- euthymic mood, full affect Neuro- strength and sensation are intact  EKG is ordered. Personal review of EKG from today shows NSR at 78 bpm, QRS 78 ms  Additional studies reviewed include: Previous EP notes. Endocrinologist notes in [place].   Assessment and Plan:  1. PAF Symptoms have been minimal and she has refused systemic OAC with h/o bleeding Can take Toprol daily or as needed for palpitations, which is more likely ectopy than PAF by her description of very short, fleeting episodes (seconds). She understands to call back if having more issues.  EF 65-70% 2017. Could consider short acting diltiazem prn. She does not have conduction disease at baseline.  2. Carotid stenosis Continue ASA and statin  3. Hypothroidism TSH 7.27 03/2020 -> 1.23 04/2020 ->  14.100 05/2020 She is not on amiodarone. There are no management avenues from a cardiology perspective.  Encouraged that her palpitations will likely improve as her thyroid is managed. She has no underlying conduction disease, so can also titrate BB as needed/tolerated.    Shirley Friar, PA-C  06/20/20 10:56 AM

## 2020-06-20 ENCOUNTER — Other Ambulatory Visit: Payer: Self-pay

## 2020-06-20 ENCOUNTER — Encounter: Payer: Self-pay | Admitting: Student

## 2020-06-20 ENCOUNTER — Ambulatory Visit: Payer: Medicare PPO | Admitting: Student

## 2020-06-20 VITALS — BP 130/70 | HR 78 | Ht 67.0 in | Wt 134.0 lb

## 2020-06-20 DIAGNOSIS — E039 Hypothyroidism, unspecified: Secondary | ICD-10-CM

## 2020-06-20 DIAGNOSIS — I10 Essential (primary) hypertension: Secondary | ICD-10-CM | POA: Diagnosis not present

## 2020-06-20 DIAGNOSIS — I480 Paroxysmal atrial fibrillation: Secondary | ICD-10-CM | POA: Diagnosis not present

## 2020-06-20 NOTE — Patient Instructions (Signed)
Medication Instructions:  *If you need a refill on your cardiac medications before your next appointment, please call your pharmacy*  Follow-Up: At [facility], you and your health needs are our priority.  As part of our continuing mission to provide you with exceptional heart care, we have created designated Provider Care Teams.  These Care Teams include your primary Cardiologist (physician) and Advanced Practice Providers (APPs -  Physician Assistants and Nurse Practitioners) who all work together to provide you with the care you need, when you need it.  We recommend signing up for the patient portal called "MyChart".  Sign up information is provided on this After Visit Summary.  MyChart is used to connect with patients for Virtual Visits (Telemedicine).  Patients are able to view lab/test results, encounter notes, upcoming appointments, etc.  Non-urgent messages can be sent to your provider as well.   To learn more about what you can do with MyChart, go to [email or web address].    Your next appointment:   Your physician recommends that you schedule a follow-up appointment in: [place] with Dr. Lovena Le  The format for your next appointment:   In Person with Cristopher Peru, MD

## 2020-06-28 DIAGNOSIS — E1065 Type 1 diabetes mellitus with hyperglycemia: Secondary | ICD-10-CM | POA: Diagnosis not present

## 2020-07-08 ENCOUNTER — Other Ambulatory Visit: Payer: Self-pay | Admitting: Internal Medicine

## 2020-07-08 DIAGNOSIS — I10 Essential (primary) hypertension: Secondary | ICD-10-CM | POA: Diagnosis not present

## 2020-07-08 DIAGNOSIS — E119 Type 2 diabetes mellitus without complications: Secondary | ICD-10-CM | POA: Diagnosis not present

## 2020-07-08 DIAGNOSIS — R112 Nausea with vomiting, unspecified: Secondary | ICD-10-CM | POA: Diagnosis not present

## 2020-07-08 DIAGNOSIS — E162 Hypoglycemia, unspecified: Secondary | ICD-10-CM | POA: Diagnosis not present

## 2020-07-08 DIAGNOSIS — Z853 Personal history of malignant neoplasm of breast: Secondary | ICD-10-CM | POA: Diagnosis not present

## 2020-07-08 DIAGNOSIS — R1032 Left lower quadrant pain: Secondary | ICD-10-CM | POA: Diagnosis not present

## 2020-07-08 DIAGNOSIS — R1111 Vomiting without nausea: Secondary | ICD-10-CM | POA: Diagnosis not present

## 2020-07-08 DIAGNOSIS — E7800 Pure hypercholesterolemia, unspecified: Secondary | ICD-10-CM | POA: Diagnosis not present

## 2020-07-08 DIAGNOSIS — R110 Nausea: Secondary | ICD-10-CM | POA: Diagnosis not present

## 2020-07-08 DIAGNOSIS — E161 Other hypoglycemia: Secondary | ICD-10-CM | POA: Diagnosis not present

## 2020-07-08 DIAGNOSIS — K529 Noninfective gastroenteritis and colitis, unspecified: Secondary | ICD-10-CM | POA: Diagnosis not present

## 2020-07-11 DIAGNOSIS — Z20828 Contact with and (suspected) exposure to other viral communicable diseases: Secondary | ICD-10-CM | POA: Diagnosis not present

## 2020-07-11 DIAGNOSIS — J069 Acute upper respiratory infection, unspecified: Secondary | ICD-10-CM | POA: Diagnosis not present

## 2020-07-12 ENCOUNTER — Ambulatory Visit: Payer: Medicare PPO | Admitting: Internal Medicine

## 2020-07-16 ENCOUNTER — Other Ambulatory Visit: Payer: Self-pay

## 2020-07-16 ENCOUNTER — Encounter: Payer: Self-pay | Admitting: Internal Medicine

## 2020-07-16 ENCOUNTER — Ambulatory Visit: Payer: Medicare PPO | Admitting: Internal Medicine

## 2020-07-16 VITALS — BP 144/70 | HR 78 | Temp 98.0°F | Ht 67.0 in | Wt 134.0 lb

## 2020-07-16 DIAGNOSIS — R109 Unspecified abdominal pain: Secondary | ICD-10-CM

## 2020-07-16 DIAGNOSIS — R6881 Early satiety: Secondary | ICD-10-CM

## 2020-07-16 DIAGNOSIS — E1043 Type 1 diabetes mellitus with diabetic autonomic (poly)neuropathy: Secondary | ICD-10-CM | POA: Diagnosis not present

## 2020-07-16 LAB — COMPREHENSIVE METABOLIC PANEL
ALT: 12 U/L (ref 0–35)
AST: 23 U/L (ref 0–37)
Albumin: 3.9 g/dL (ref 3.5–5.2)
Alkaline Phosphatase: 83 U/L (ref 39–117)
BUN: 12 mg/dL (ref 6–23)
CO2: 30 mEq/L (ref 19–32)
Calcium: 10.1 mg/dL (ref 8.4–10.5)
Chloride: 98 mEq/L (ref 96–112)
Creatinine, Ser: 0.8 mg/dL (ref 0.40–1.20)
GFR: 65.47 mL/min (ref >60.00)
Glucose, Bld: 96 mg/dL (ref 70–99)
Potassium: 3.4 mEq/L — ABNORMAL LOW (ref 3.5–5.1)
Sodium: 134 mEq/L — ABNORMAL LOW (ref 135–145)
Total Bilirubin: 0.5 mg/dL (ref 0.2–1.2)
Total Protein: 7.7 g/dL (ref 6.0–8.3)

## 2020-07-16 LAB — CBC WITH DIFFERENTIAL/PLATELET
Basophils Absolute: 0.1 10*3/uL (ref 0.0–0.1)
Basophils Relative: 0.7 % (ref 0.0–3.0)
Eosinophils Absolute: 0.1 10*3/uL (ref 0.0–0.7)
Eosinophils Relative: 1.7 % (ref 0.0–5.0)
HCT: 38.2 % (ref 36.0–46.0)
Hemoglobin: 12.9 g/dL (ref 12.0–15.0)
Lymphocytes Relative: 30.1 % (ref 12.0–46.0)
Lymphs Abs: 2.3 10*3/uL (ref 0.7–4.0)
MCHC: 33.8 g/dL (ref 30.0–36.0)
MCV: 87.4 fl (ref 78.0–100.0)
Monocytes Absolute: 0.8 10*3/uL (ref 0.1–1.0)
Monocytes Relative: 10.3 % (ref 3.0–12.0)
Neutro Abs: 4.4 10*3/uL (ref 1.4–7.7)
Neutrophils Relative %: 57.2 % (ref 43.0–77.0)
Platelets: 253 10*3/uL (ref 150.0–400.0)
RBC: 4.37 Mil/uL (ref 3.87–5.11)
RDW: 13 % (ref 11.5–15.5)
WBC: 7.7 10*3/uL (ref 4.0–10.5)

## 2020-07-16 LAB — C-REACTIVE PROTEIN: CRP: <1 mg/dL (ref 0.5–20.0)

## 2020-07-16 LAB — HEMOGLOBIN A1C: Hgb A1c MFr Bld: 7.8 % — ABNORMAL HIGH (ref 4.6–6.5)

## 2020-07-16 NOTE — Patient Instructions (Addendum)
Start taking the miralax daily.  Continue the metamucil daily.    Have blood work today.    A Ct scan was ordered.  They will call you to schedule this.

## 2020-07-16 NOTE — Assessment & Plan Note (Signed)
Acute After a few bites she feels full ?  Related to severe constipation, colitis, obstruction She had gone to the emergency room recently with nausea and vomiting and has had early satiety so is not eating much and is now becoming weak Will obtain CT of the abdomen in addition to blood work

## 2020-07-16 NOTE — Assessment & Plan Note (Signed)
Acute Abdominal pain in the periumbilical area and right lower quadrant-intermittent and she is tender on exam.  No rebound or guarding Concern for partial obstruction, constipation, less likely cancer which is her big concern Given that she is tender on exam, losing weight and all of her other symptoms will obtain a CT of the abdomen and pelvis Check CBC, CMP, CRP Continue Metamucil daily Start MiraLAX daily in case this is all related to severe constipation

## 2020-07-16 NOTE — Assessment & Plan Note (Signed)
Chronic She is not eating well and at times has had a low sugar-advised that she discuss this with her endocrinologist Will check A1c

## 2020-07-16 NOTE — Progress Notes (Signed)
Subjective:    Patient ID: [name], female    DOB: 1929/05/23, 86 y.o.   MRN: [id number]  HPI The patient is here for follow up from the hospital.  She is still having trouble with her stomach.  She eats 4-5 bites of her meals and feels full.  This has gotten worse and she thinks she has lost weight.  Her sugar is also dropping because she is not eating.   Two weeks ago she had nausea and vomited.  Her sugar was very low because she was not eating. She went to th ED - she had labs and UA and they were normal.  She was given an anti-nausea medication and she stopped vomiting and was discharged home.   Her stomach has not been good for a long time.  She is getting weaker from not eating.  She takes Metamucil every morning and takes MiraLAX once in a while.  She does have regular bowel movements, but there is not a lot to it.  She has always suffered with chronic constipation.  She states abdominal pain, nausea, feeling full easily and generalized weakness.  Emergency room notes not available for review  Medications and allergies reviewed with patient and updated if appropriate.  Patient Active Problem List   Diagnosis Date Noted  . Left leg swelling 03/05/2020  . B12 deficiency 12/15/2019  . Tingling in extremities 10/04/2019  . Urinary frequency 08/16/2019  . Chronic thoracic back pain 08/16/2019  . Atrial fibrillation ([place]) 02/01/2018  . Leg cramps 07/05/2017  . Constipation 01/25/2017  . Closed fracture of proximal end of right humerus 07/09/2016  . Cough 06/17/2016  . Abdominal pain 03/10/2016  . Hearing loss 01/03/2016  . URI (upper respiratory infection) 01/03/2016  . Allergic rhinitis 01/03/2016  . Minimal Coronary Plaque by Cardiac Cath in 2013 09/11/2015  . Palpitations 09/06/2015  . Frequent loose stools 08/28/2015  . Hyponatremia   . Acute mesenteric ischemia ([place])   . Hypothyroidism 07/11/2012  . Syncope 03/18/2012  . Hypercholesteremia   .  Hypertension   . Diabetes mellitus type 1 ([place])   . Osteoarthritis   . Thyroid nodule   . Osteoporosis, post-menopausal   . Primary cancer of upper outer quadrant of left female breast ([place]) 08/21/2011    Current Outpatient Medications on File Prior to Visit  Medication Sig Dispense Refill  . ALPRAZolam (XANAX) 0.5 MG tablet TAKE 1/2 TABLET TWICE DAILY AS NEEDED FOR ANXIETY OR SLEEP 30 tablet 5  . aspirin EC 81 MG tablet Take 81 mg by mouth daily.    . B Complex-C (SUPER B COMPLEX PO) Take 1 tablet by mouth daily.    . BD VEO INSULIN SYRINGE U/F 31G X 15/64" 0.3 ML MISC USE WITH INJECTIONS 100 each 2  . bisacodyl (DULCOLAX) 10 MG suppository Place 1 suppository (10 mg total) rectally as needed for moderate constipation. 12 suppository 0  . carboxymethylcellulose (REFRESH PLUS) 0.5 % SOLN Place 1 drop into both eyes 3 (three) times daily as needed. Non-preservative    . cholecalciferol (VITAMIN D) 1000 UNITS tablet Take 5,000 Units by mouth daily.     . Continuous Blood Gluc Sensor ([place]) MISC by Does not apply route. Sensor is located in Right arm    . fish oil-omega-3 fatty acids 1000 MG capsule Take 2 g by mouth daily.    [name] glucose blood test strip OneTouch Ultra Blue Test Strip    . hydrochlorothiazide (HYDRODIURIL) 25  MG tablet TAKE (1) TABLET DAILY AS NEEDED. (Patient taking differently: Patient takes 12.5 tab instead of 12 mg daily) 90 tablet 0  . Insulin Glargine (LANTUS SOLOSTAR) 100 UNIT/ML Solostar Pen Inject 14 Units into the skin daily at 10 pm.     . insulin lispro (HUMALOG KWIKPEN) 100 UNIT/ML KiwkPen Inject into the skin. Sliding scale before meal    . Insulin Pen Needle (BD PEN NEEDLE NANO 2ND GEN) 32G X 4 MM MISC INJECT AS DIRECTED FOUR TIMES A DAY    . levothyroxine (SYNTHROID) 112 MCG tablet     . losartan (COZAAR) 100 MG tablet TAKE 1 TABLET ONCE DAILY. 90 tablet 0  . metoprolol succinate (TOPROL-XL) 25 MG 24 hr tablet Take 0.5 tablets (12.5 mg  total) by mouth daily as needed. For palpitations. 45 tablet 3  . Multiple Vitamin (MULTIVITAMIN) tablet Take 1 tablet by mouth daily. For breast and bone health    . ondansetron (ZOFRAN-ODT) 4 MG disintegrating tablet     . saccharomyces boulardii (FLORASTOR) 250 MG capsule Take 1 capsule (250 mg total) by mouth 2 (two) times daily. (Patient taking differently: Take 250 mg by mouth as needed. ) 60 capsule 5  . simvastatin (ZOCOR) 20 MG tablet TAKE ONE TABLET AT BEDTIME. 90 tablet 1   No current facility-administered medications on file prior to visit.    Past Medical History:  Diagnosis Date  . Anxiety   . Arthritis    fingers  . Bowel obstruction ([place])   . Cancer of upper-outer quadrant of female breast ([place]) 08/21/2011   ER +  PR +  Her 2 -  Ki67 9%   0.9 cm invasive lobular  Carcinoma ,s/p central lumpectomy with sentinel node biopsy,  ER/PR positive s/p re-excion on 09/16/11 with final pathology showing atypical hyperplasia   . CAP (community acquired pneumonia) 12/06/2016  . Diabetes mellitus type 1 (HCC)    type I- wears insulin pump  . GERD (gastroesophageal reflux disease)   . History of small bowel obstruction   . Hypercholesteremia   . Hypergastrinemia   . Hypertension   . Hypertension   . Hyponatremia   . Hypothyroid   . Hypothyroidism   . IBS (irritable bowel syndrome)   . Osteoarthritis   . Osteoporosis, post-menopausal    DEXA 12/02/11: -3.5 (with lomax gyn), declines bisphos due to bone pain  . PAF (paroxysmal atrial fibrillation) ([place])    One episode in 2008, spontaneously converted in the ED, no further treatment  . Spondylosis    thoracic spine  . Thyroid nodule dx 07/2011   [place] q 77mo to follow calcified L thyroid nodule (12/08/11 [place])  . Umbilical hernia     Past Surgical History:  Procedure Laterality Date  . ABDOMINAL HYSTERECTOMY    . ABDOMINAL HYSTERECTOMY  yrs ago  . APPENDECTOMY    . APPENDECTOMY  age 5  . BLADDER REPAIR  5 yrs ago  . BREAST  LUMPECTOMY    . BREAST SURGERY     biopsy  . BREAST SURGERY   yrs ago   br bx  . BREAST SURGERY  01/ 23/2013   left central lumpectomy and snbx, re-excision lumpectomy  . CARDIOVASCULAR STRESS TEST  03/17/2012  . colon surgery     Small intestines- took 6 inches out  . COLONOSCOPY  07/11/2015   [facility] Dr Derrill Kay  . ESOPHAGOGASTRODUODENOSCOPY  07/17/2010   [facility]  . LEFT HEART CATHETERIZATION WITH CORONARY ANGIOGRAM N/A 03/18/2012  Procedure: LEFT HEART CATHETERIZATION WITH CORONARY ANGIOGRAM;  Surgeon: Minus Breeding, MD;  Location[facility] C[facility] LAB;  Service: Cardiovascular;  Laterality: N/A;  . TONSILLECTOMY   age 53    Social History   Socioeconomic History  . Marital status: Married    Spouse name: Not on file  . Number of children: Not on file  . Years of education: Not on file  . Highest education level: Not on file  Occupational History  . Not on file  Tobacco Use  . Smoking status: Former Smoker    Packs/day: 1.00    Years: 10.00    Pack years: 10.00    Types: Cigarettes    Quit date: 08/27/1977    Years since quitting: 42.9  . Smokeless tobacco: Never Used  Vaping Use  . Vaping Use: Never used  Substance and Sexual Activity  . Alcohol use: Yes    Comment: rare  . Drug use: No  . Sexual activity: Yes    Birth control/protection: Post-menopausal  Other Topics Concern  . Not on file  Social History Narrative   ** Merged History Encounter **       Social Determinants of Health   Financial Resource Strain:   . Difficulty of Paying Living Expenses: Not on file  Food Insecurity:   . Worried About Charity fundraiser in the Last Year: Not on file  . Ran Out of Food in the Last Year: Not on file  Transportation Needs:   . Lack of Transportation (Medical): Not on file  . Lack of Transportation (Non-Medical): Not on file  Physical Activity:   . Days of Exercise per Week: Not on file  . Minutes of Exercise per Session: Not on file  Stress:   . Feeling of  Stress : Not on file  Social Connections:   . Frequency of Communication with Friends and Family: Not on file  . Frequency of Social Gatherings with Friends and Family: Not on file  . Attends Religious Services: Not on file  . Active Member of Clubs or Organizations: Not on file  . Attends Archivist Meetings: Not on file  . Marital Status: Not on file    Family History  Problem Relation Age of Onset  . Cancer Sister        unknown  . Hypertension Mother   . Stroke Mother   . Arthritis Father   . Heart disease Father   . Diabetes Son   . Colon cancer Neg Hx   . Esophageal cancer Neg Hx     Review of Systems  Constitutional: Positive for appetite change. Negative for chills and fever.  Respiratory: Negative for shortness of breath.   Cardiovascular: Positive for palpitations. Negative for chest pain and leg swelling.  Gastrointestinal: Positive for abdominal pain (right abdominal pain - sharp - lasts few seconds) and constipation ( alittle bit here and there). Negative for blood in stool and nausea.       Early satiety, no gerd  Genitourinary: Negative for dysuria and hematuria.  Neurological: Positive for light-headedness (mild). Negative for headaches.       Objective:   Vitals:   07/16/20 1432  BP: (!) 144/70  Pulse: 78  Temp: 98 F (36.7 C)  SpO2: 96%   BP Readings from Last 3 Encounters:  07/16/20 (!) 144/70  06/20/20 130/70  03/26/20 (!) 152/80   Wt Readings from Last 3 Encounters:  07/16/20 134 lb (60.8 kg)  06/20/20 134 lb (60.8 kg)  03/26/20 134 lb (60.8 kg)   Body mass index is 20.99 kg/m.   Physical Exam    Constitutional: Appears well-developed and well-nourished. No distress.  HENT:  Head: Normocephalic and atraumatic.  Cardiovascular: Normal rate, regular rhythm and normal heart sounds.     No edema Pulmonary/Chest: Effort normal and breath sounds normal. No respiratory distress. No has no wheezes. No rales. Abdomen: Soft,  tenderness periumbilical and right lower quadrant-most pronounced in the right lower quadrant.  No rebound or guarding.  No ascites.  Nondistended. Skin: Skin is warm and dry. Not diaphoretic.     CT scan abdomen and pelvis with contrast 04/10/2019: Findings suspicious for inflammatory colitis involving the transverse and descending colon.  There is a large amount of stool in the rectum and portions of the sigmoid colon.  Fecalization of the small distal bowel consistent with slow transit.  Apparent pelvic floor laxity with probable rectocele.  Aortic atherosclerosis.  Last colonoscopy done 2013  Assessment & Plan:    See Problem List for Assessment and Plan of chronic medical problems.    This visit occurred during the SARS-CoV-2 public health emergency.  Safety protocols were in place, including screening questions prior to the visit, additional usage of staff PPE, and extensive cleaning of exam room while observing appropriate contact time as indicated for disinfecting solutions.

## 2020-07-18 ENCOUNTER — Inpatient Hospital Stay: Admission: RE | Admit: 2020-07-18 | Payer: Medicare PPO | Source: Ambulatory Visit

## 2020-07-19 DIAGNOSIS — E063 Autoimmune thyroiditis: Secondary | ICD-10-CM | POA: Diagnosis not present

## 2020-07-19 DIAGNOSIS — E038 Other specified hypothyroidism: Secondary | ICD-10-CM | POA: Diagnosis not present

## 2020-07-26 ENCOUNTER — Other Ambulatory Visit: Payer: Self-pay

## 2020-07-26 ENCOUNTER — Ambulatory Visit (INDEPENDENT_AMBULATORY_CARE_PROVIDER_SITE_OTHER)
Admission: RE | Admit: 2020-07-26 | Discharge: 2020-07-26 | Disposition: A | Discharge status: Home / Self Care | Payer: Medicare PPO | Source: Ambulatory Visit | Attending: Internal Medicine | Admitting: Internal Medicine

## 2020-07-26 DIAGNOSIS — R109 Unspecified abdominal pain: Secondary | ICD-10-CM

## 2020-07-26 DIAGNOSIS — R1031 Right lower quadrant pain: Secondary | ICD-10-CM | POA: Diagnosis not present

## 2020-07-26 MED ORDER — IOHEXOL 300 MG/ML  SOLN
100.0000 mL | Freq: Once | INTRAMUSCULAR | Status: AC | PRN
  Administered 2020-07-26: 100 mL via INTRAVENOUS

## 2020-09-16 DIAGNOSIS — E1065 Type 1 diabetes mellitus with hyperglycemia: Secondary | ICD-10-CM | POA: Diagnosis not present

## 2020-10-04 DIAGNOSIS — R051 Acute cough: Secondary | ICD-10-CM | POA: Diagnosis not present

## 2020-10-04 DIAGNOSIS — Z20828 Contact with and (suspected) exposure to other viral communicable diseases: Secondary | ICD-10-CM | POA: Diagnosis not present

## 2020-10-04 DIAGNOSIS — R070 Pain in throat: Secondary | ICD-10-CM | POA: Diagnosis not present

## 2020-10-04 DIAGNOSIS — R519 Headache, unspecified: Secondary | ICD-10-CM | POA: Diagnosis not present

## 2020-10-08 NOTE — Assessment & Plan Note (Deleted)
history of StageIAleft breastinvasive lobular carcinoma, ER+/PR+/HER2-, diagnosed in1/2013, treated with lumpectomy,unable to tolerate Tamoxifen, and declined adjuvant radiation  Breast Cancer Surveillance: 1. Breast exam 10/08/2020: Benign 2. Mammogram6/22/21No abnormalities. Postsurgical changes. Breast Density Category C.  Chronic back pain: Sees orthopedics Hypothyroidism: Sees endocrinology Patient wishes to follow-up with me once a year for a breast exam and follow-up.  RTC in 1 year

## 2020-10-08 NOTE — Progress Notes (Incomplete)
Patient Care Team: Binnie Rail, MD as PCP - General (Internal Medicine) Evans Lance, MD as PCP - Electrophysiology (Cardiology) Rolm Bookbinder, MD (General Surgery) Lonna Duval, MD (Endocrinology) Marica Otter, OD (Optometry)  DIAGNOSIS:    ICD-10-CM   1. Primary cancer of upper outer quadrant of left female breast ([place])  C50.412     SUMMARY OF ONCOLOGIC HISTORY: Oncology History  Primary cancer of upper outer quadrant of left female breast ([place])  09/02/2011 Surgery   Left lumpectomy: grade 1 lobular carcinoma measuring 0.9 cm with LCIS Medial and superior margins were focally positive, reexcision margins negative, 0/1 sentinel node, ER/PR positive HER-2 negative   09/25/2011 - 01/07/2012 Anti-estrogen oral therapy   Patient could not tolerate tamoxifen, also did not receive radiation at [facility] because of coronary artery disease and patient stopped after one radiation treatment     CHIEF COMPLIANT: Surveillance of left breast cancer  INTERVAL HISTORY: [name] is a 87 y.o. with above-mentioned history of left breast invasive lobular cancer treated withlumpectomy and who is currently on surveillance. Mammogram on 01/26/20 showed no evidence of malignancy bilaterally. She presents to the clinicalone today for annual follow-up.    ALLERGIES:  is allergic to eliquis [apixaban], amlodipine, augmentin [amoxicillin-pot clavulanate], cortisone, keflex [cephalexin], omeprazole, prednisone, hydrocodone, and latex.  MEDICATIONS:  Current Outpatient Medications  Medication Sig Dispense Refill  . ALPRAZolam (XANAX) 0.5 MG tablet TAKE 1/2 TABLET TWICE DAILY AS NEEDED FOR ANXIETY OR SLEEP 30 tablet 5  . aspirin EC 81 MG tablet Take 81 mg by mouth daily.    . B Complex-C (SUPER B COMPLEX PO) Take 1 tablet by mouth daily.    . BD VEO INSULIN SYRINGE U/F 31G X 15/64" 0.3 ML MISC USE WITH INJECTIONS 100 each 2  . bisacodyl (DULCOLAX) 10 MG suppository  Place 1 suppository (10 mg total) rectally as needed for moderate constipation. 12 suppository 0  . carboxymethylcellulose (REFRESH PLUS) 0.5 % SOLN Place 1 drop into both eyes 3 (three) times daily as needed. Non-preservative    . cholecalciferol (VITAMIN D) 1000 UNITS tablet Take 5,000 Units by mouth daily.     . Continuous Blood Gluc Sensor ([place]) MISC by Does not apply route. Sensor is located in Right arm    . fish oil-omega-3 fatty acids 1000 MG capsule Take 2 g by mouth daily.    [name] glucose blood test strip OneTouch Ultra Blue Test Strip    . hydrochlorothiazide (HYDRODIURIL) 25 MG tablet TAKE (1) TABLET DAILY AS NEEDED. (Patient taking differently: Patient takes 12.5 tab instead of 12 mg daily) 90 tablet 0  . Insulin Glargine (LANTUS SOLOSTAR) 100 UNIT/ML Solostar Pen Inject 14 Units into the skin daily at 10 pm.     . insulin lispro (HUMALOG KWIKPEN) 100 UNIT/ML KiwkPen Inject into the skin. Sliding scale before meal    . Insulin Pen Needle (BD PEN NEEDLE NANO 2ND GEN) 32G X 4 MM MISC INJECT AS DIRECTED FOUR TIMES A DAY    . levothyroxine (SYNTHROID) 112 MCG tablet     . losartan (COZAAR) 100 MG tablet TAKE 1 TABLET ONCE DAILY. 90 tablet 0  . metoprolol succinate (TOPROL-XL) 25 MG 24 hr tablet Take 0.5 tablets (12.5 mg total) by mouth daily as needed. For palpitations. 45 tablet 3  . Multiple Vitamin (MULTIVITAMIN) tablet Take 1 tablet by mouth daily. For breast and bone health    . ondansetron (ZOFRAN-ODT) 4 MG disintegrating tablet     .  saccharomyces boulardii (FLORASTOR) 250 MG capsule Take 1 capsule (250 mg total) by mouth 2 (two) times daily. (Patient taking differently: Take 250 mg by mouth as needed. ) 60 capsule 5  . simvastatin (ZOCOR) 20 MG tablet TAKE ONE TABLET AT BEDTIME. 90 tablet 1   No current facility-administered medications for this visit.    PHYSICAL EXAMINATION: ECOG PERFORMANCE STATUS: {CHL ONC ECOG PS:[id number]}  There were no vitals  filed for this visit. There were no vitals filed for this visit.  BREAST:*** No palpable masses or nodules in either right or left breasts. No palpable axillary supraclavicular or infraclavicular adenopathy no breast tenderness or nipple discharge. (exam performed in the presence of a chaperone)  LABORATORY DATA:  I have reviewed the data as listed CMP Latest Ref Rng & Units 07/16/2020 12/15/2019 10/04/2019  Glucose 70 - 99 mg/dL 96 170(H) 128(H)  BUN 6 - 23 mg/dL $Remove'12 19 14  'FQPDtHs$ Creatinine 0.40 - 1.20 mg/dL 0.80 0.79 0.77  Sodium 135 - 145 mEq/L 134(L) 130(L) 132(L)  Potassium 3.5 - 5.1 mEq/L 3.4(L) 3.9 3.9  Chloride 96 - 112 mEq/L 98 98 98  CO2 19 - 32 mEq/L $Remove'30 28 25  'AOekYYM$ Calcium 8.4 - 10.5 mg/dL 10.1 9.8 10.3  Total Protein 6.0 - 8.3 g/dL 7.7 7.5 7.8  Total Bilirubin 0.2 - 1.2 mg/dL 0.5 0.5 0.6  Alkaline Phos 39 - 117 U/L 83 76 75  AST 0 - 37 U/L $Remo'23 20 26  'fdBsF$ ALT 0 - 35 U/L $Remo'12 11 15    'DLGbu$ Lab Results  Component Value Date   WBC 7.7 07/16/2020   HGB 12.9 07/16/2020   HCT 38.2 07/16/2020   MCV 87.4 07/16/2020   PLT 253.0 07/16/2020   NEUTROABS 4.4 07/16/2020    ASSESSMENT & PLAN:  No problem-specific Assessment & Plan notes found for this encounter.    No orders of the defined types were placed in this encounter.  The patient has a good understanding of the overall plan. she agrees with it. she will call with any problems that may develop before the next visit here.  Total time spent: *** mins including face to face time and time spent for planning, charting and coordination of care  Rulon Eisenmenger, MD, MPH 10/08/2020  I, Molly Dorshimer, am acting as scribe for Dr. Nicholas Lose.  {insert scribe attestation}

## 2020-10-09 ENCOUNTER — Inpatient Hospital Stay: Payer: Medicare PPO | Admitting: Hematology and Oncology

## 2020-10-09 ENCOUNTER — Telehealth: Payer: Self-pay | Admitting: Hematology and Oncology

## 2020-10-09 ENCOUNTER — Other Ambulatory Visit: Payer: Self-pay | Admitting: Internal Medicine

## 2020-10-09 ENCOUNTER — Other Ambulatory Visit: Payer: Self-pay

## 2020-10-09 DIAGNOSIS — C50412 Malignant neoplasm of upper-outer quadrant of left female breast: Secondary | ICD-10-CM

## 2020-10-09 DIAGNOSIS — F411 Generalized anxiety disorder: Secondary | ICD-10-CM

## 2020-10-09 NOTE — Telephone Encounter (Signed)
Rescheduled today's appointment due to patient arriving at 9am instead of 1:45pm. Patient requested afternoon appointment. Patient is aware of changes.

## 2020-10-10 ENCOUNTER — Encounter: Payer: Self-pay | Admitting: Internal Medicine

## 2020-10-14 ENCOUNTER — Other Ambulatory Visit: Payer: Self-pay | Admitting: Internal Medicine

## 2020-10-22 ENCOUNTER — Telehealth: Payer: Self-pay | Admitting: Hematology and Oncology

## 2020-10-22 NOTE — Telephone Encounter (Signed)
Rescheduled 03/17 appointment to 03/23 per patient's request.

## 2020-10-24 ENCOUNTER — Inpatient Hospital Stay: Payer: Medicare PPO | Admitting: Hematology and Oncology

## 2020-10-28 DIAGNOSIS — E1069 Type 1 diabetes mellitus with other specified complication: Secondary | ICD-10-CM | POA: Diagnosis not present

## 2020-10-28 DIAGNOSIS — E1042 Type 1 diabetes mellitus with diabetic polyneuropathy: Secondary | ICD-10-CM | POA: Diagnosis not present

## 2020-10-28 DIAGNOSIS — E038 Other specified hypothyroidism: Secondary | ICD-10-CM | POA: Diagnosis not present

## 2020-10-28 DIAGNOSIS — E785 Hyperlipidemia, unspecified: Secondary | ICD-10-CM | POA: Diagnosis not present

## 2020-10-28 DIAGNOSIS — I152 Hypertension secondary to endocrine disorders: Secondary | ICD-10-CM | POA: Diagnosis not present

## 2020-10-28 DIAGNOSIS — E063 Autoimmune thyroiditis: Secondary | ICD-10-CM | POA: Diagnosis not present

## 2020-10-28 DIAGNOSIS — E1159 Type 2 diabetes mellitus with other circulatory complications: Secondary | ICD-10-CM | POA: Diagnosis not present

## 2020-10-29 NOTE — Progress Notes (Signed)
Patient Care Team: Binnie Rail, MD as PCP - General (Internal Medicine) Evans Lance, MD as PCP - Electrophysiology (Cardiology) Rolm Bookbinder, MD (General Surgery) Lonna Duval, MD (Endocrinology) Marica Otter, OD (Optometry)  DIAGNOSIS:    ICD-10-CM   1. Primary cancer of upper outer quadrant of left female breast ([place])  C50.412     SUMMARY OF ONCOLOGIC HISTORY: Oncology History  Primary cancer of upper outer quadrant of left female breast ([place])  09/02/2011 Surgery   Left lumpectomy: grade 1 lobular carcinoma measuring 0.9 cm with LCIS Medial and superior margins were focally positive, reexcision margins negative, 0/1 sentinel node, ER/PR positive HER-2 negative   09/25/2011 - 01/07/2012 Anti-estrogen oral therapy   Patient could not tolerate tamoxifen, also did not receive radiation at [facility] because of coronary artery disease and patient stopped after one radiation treatment     CHIEF COMPLIANT: Surveillance of left breast cancer  INTERVAL HISTORY: [name] is a 87 y.o. with above-mentioned history of left breast invasive lobular cancer treated withlumpectomy and who is currently on surveillance. Mammogram on 01/26/20 showed no evidence of malignanc bilaterally. She presents to the clinicalone today for annual follow-up.   ALLERGIES:  is allergic to eliquis [apixaban], amlodipine, augmentin [amoxicillin-pot clavulanate], cortisone, keflex [cephalexin], omeprazole, prednisone, hydrocodone, and latex.  MEDICATIONS:  Current Outpatient Medications  Medication Sig Dispense Refill  . ALPRAZolam (XANAX) 0.5 MG tablet TAKE 1/2 TABLET TWICE DAILY AS NEEDED FOR ANXIETY OR SLEEP 30 tablet 5  . aspirin EC 81 MG tablet Take 81 mg by mouth daily.    . B Complex-C (SUPER B COMPLEX PO) Take 1 tablet by mouth daily.    . BD VEO INSULIN SYRINGE U/F 31G X 15/64" 0.3 ML MISC USE WITH INJECTIONS 100 each 2  . bisacodyl (DULCOLAX) 10 MG suppository Place 1  suppository (10 mg total) rectally as needed for moderate constipation. 12 suppository 0  . carboxymethylcellulose (REFRESH PLUS) 0.5 % SOLN Place 1 drop into both eyes 3 (three) times daily as needed. Non-preservative    . cholecalciferol (VITAMIN D) 1000 UNITS tablet Take 5,000 Units by mouth daily.     . Continuous Blood Gluc Sensor ([place]) MISC by Does not apply route. Sensor is located in Right arm    . fish oil-omega-3 fatty acids 1000 MG capsule Take 2 g by mouth daily.    [name] glucose blood test strip OneTouch Ultra Blue Test Strip    . hydrochlorothiazide (HYDRODIURIL) 25 MG tablet TAKE (1) TABLET DAILY AS NEEDED. (Patient taking differently: Patient takes 12.5 tab instead of 12 mg daily) 90 tablet 0  . Insulin Glargine (LANTUS SOLOSTAR) 100 UNIT/ML Solostar Pen Inject 14 Units into the skin daily at 10 pm.     . insulin lispro (HUMALOG KWIKPEN) 100 UNIT/ML KiwkPen Inject into the skin. Sliding scale before meal    . Insulin Pen Needle (BD PEN NEEDLE NANO 2ND GEN) 32G X 4 MM MISC INJECT AS DIRECTED FOUR TIMES A DAY    . levothyroxine (SYNTHROID) 112 MCG tablet     . losartan (COZAAR) 100 MG tablet TAKE 1 TABLET ONCE DAILY. 90 tablet 0  . metoprolol succinate (TOPROL-XL) 25 MG 24 hr tablet Take 0.5 tablets (12.5 mg total) by mouth daily as needed. For palpitations. 45 tablet 3  . Multiple Vitamin (MULTIVITAMIN) tablet Take 1 tablet by mouth daily. For breast and bone health    . ondansetron (ZOFRAN-ODT) 4 MG disintegrating tablet     .  saccharomyces boulardii (FLORASTOR) 250 MG capsule Take 1 capsule (250 mg total) by mouth 2 (two) times daily. (Patient taking differently: Take 250 mg by mouth as needed. ) 60 capsule 5  . simvastatin (ZOCOR) 20 MG tablet TAKE ONE TABLET AT BEDTIME. 90 tablet 1   No current facility-administered medications for this visit.    PHYSICAL EXAMINATION: ECOG PERFORMANCE STATUS: 1 - Symptomatic but completely ambulatory  Vitals:    10/30/20 1515  BP: (!) 171/68  Pulse: 78  Resp: 17  Temp: 99.1 F (37.3 C)  SpO2: 95%   Filed Weights   10/30/20 1515  Weight: 134 lb 14.4 oz (61.2 kg)    BREAST: No palpable masses or nodules in either right or left breasts. No palpable axillary supraclavicular or infraclavicular adenopathy no breast tenderness or nipple discharge. (exam performed in the presence of a chaperone)  LABORATORY DATA:  I have reviewed the data as listed CMP Latest Ref Rng & Units 07/16/2020 12/15/2019 10/04/2019  Glucose 70 - 99 mg/dL 96 170(H) 128(H)  BUN 6 - 23 mg/dL _0 Creatinine 0.40 - 1.20 mg/dL 0.80 0.79 0.77  Sodium 135 - 145 mEq/L 134(L) 130(L) 132(L)  Potassium 3.5 - 5.1 mEq/L 3.4(L) 3.9 3.9  Chloride 96 - 112 mEq/L 98 98 98  CO2 19 - 32 mEq/L _1 Calcium 8.4 - 10.5 mg/dL 10.1 9.8 10.3  Total Protein 6.0 - 8.3 g/dL 7.7 7.5 7.8  Total Bilirubin 0.2 - 1.2 mg/dL 0.5 0.5 0.6  Alkaline Phos 39 - 117 U/L 83 76 75  AST 0 - 37 U/L _2 ALT 0 - 35 U/L _3 Lab Results  Component Value Date   WBC 7.7 07/16/2020   HGB 12.9 07/16/2020   HCT 38.2 07/16/2020   MCV 87.4 07/16/2020   PLT 253.0 07/16/2020   NEUTROABS 4.4 07/16/2020    ASSESSMENT & PLAN:  Primary cancer of upper outer quadrant of left female breast ([place]) history of StageIAleft breastinvasive lobular carcinoma, ER+/PR+/HER2-, diagnosed in1/2013, treated with lumpectomy,unable to tolerate Tamoxifen, and declined adjuvant radiation  Breast Cancer Surveillance: 1. Breast exam3/23/22: Benign 2. Mammogram6/22/21No abnormalities. Postsurgical changes. Breast Density Category C.  Patient needs another mammogram this year.  Chronic back pain: Sees orthopedics Hypothyroidism: Sees endocrinology Patient wishes to follow-up with me once a year for a breast exam and follow-up.  Severe neck pain: We will get an x-ray of the neck today and I will call her tomorrow with the results. RTC in 1 year    No  orders of the defined types were placed in this encounter.  The patient has a good understanding of the overall plan. she agrees with it. she will call with any problems that may develop before the next visit here.  Total time spent: 20 mins including face to face time and time spent for planning, charting and coordination of care  Rulon Eisenmenger, MD, MPH 10/30/2020  I, Cloyde Reams Dorshimer, am acting as scribe for Dr. Nicholas Lose.  I have reviewed the above documentation for accuracy and completeness, and I agree with the above.

## 2020-10-30 ENCOUNTER — Other Ambulatory Visit: Payer: Self-pay

## 2020-10-30 ENCOUNTER — Ambulatory Visit (HOSPITAL_COMMUNITY)
Admission: RE | Admit: 2020-10-30 | Discharge: 2020-10-30 | Disposition: A | Discharge status: Home / Self Care | Payer: Medicare PPO | Source: Ambulatory Visit | Attending: Hematology and Oncology | Admitting: Hematology and Oncology

## 2020-10-30 ENCOUNTER — Inpatient Hospital Stay: Payer: Medicare PPO | Attending: Hematology and Oncology | Admitting: Hematology and Oncology

## 2020-10-30 VITALS — BP 171/68 | HR 78 | Temp 99.1°F | Resp 17 | Ht 67.0 in | Wt 134.9 lb

## 2020-10-30 DIAGNOSIS — M542 Cervicalgia: Secondary | ICD-10-CM

## 2020-10-30 DIAGNOSIS — I6523 Occlusion and stenosis of bilateral carotid arteries: Secondary | ICD-10-CM | POA: Diagnosis not present

## 2020-10-30 DIAGNOSIS — Z7982 Long term (current) use of aspirin: Secondary | ICD-10-CM | POA: Diagnosis not present

## 2020-10-30 DIAGNOSIS — Z170 Estrogen receptor positive status [ER+]: Secondary | ICD-10-CM | POA: Insufficient documentation

## 2020-10-30 DIAGNOSIS — E039 Hypothyroidism, unspecified: Secondary | ICD-10-CM | POA: Diagnosis not present

## 2020-10-30 DIAGNOSIS — Z79899 Other long term (current) drug therapy: Secondary | ICD-10-CM | POA: Insufficient documentation

## 2020-10-30 DIAGNOSIS — C50412 Malignant neoplasm of upper-outer quadrant of left female breast: Secondary | ICD-10-CM | POA: Diagnosis not present

## 2020-10-30 DIAGNOSIS — I708 Atherosclerosis of other arteries: Secondary | ICD-10-CM | POA: Diagnosis not present

## 2020-10-30 NOTE — Assessment & Plan Note (Signed)
history of StageIAleft breastinvasive lobular carcinoma, ER+/PR+/HER2-, diagnosed in1/2013, treated with lumpectomy,unable to tolerate Tamoxifen, and declined adjuvant radiation  Breast Cancer Surveillance: 1. Breast exam3/23/22: Benign 2. Mammogram6/22/21No abnormalities. Postsurgical changes. Breast Density Category C.  Patient needs another mammogram this year.  Chronic back pain: Sees orthopedics Hypothyroidism: Sees endocrinology Patient wishes to follow-up with me once a year for a breast exam and follow-up.  RTC in 1 year

## 2020-11-12 ENCOUNTER — Other Ambulatory Visit: Payer: Self-pay

## 2020-11-12 ENCOUNTER — Encounter: Payer: Self-pay | Admitting: Internal Medicine

## 2020-11-12 ENCOUNTER — Ambulatory Visit: Payer: Medicare PPO | Admitting: Internal Medicine

## 2020-11-12 VITALS — BP 163/77 | HR 82 | Ht 67.0 in | Wt 134.0 lb

## 2020-11-12 DIAGNOSIS — I480 Paroxysmal atrial fibrillation: Secondary | ICD-10-CM

## 2020-11-12 DIAGNOSIS — I10 Essential (primary) hypertension: Secondary | ICD-10-CM | POA: Diagnosis not present

## 2020-11-12 NOTE — Progress Notes (Signed)
HPI Mrs. Matsuura returns today for followup. She is a pleasant 87 yo woman with a h/o breast CA, PAF, who has not been willing to take systemic anti-coagulation in the past due to bleeding. In the interim, she has had a repeat carotid u/s which demonstrates less than 39% carotid stenosis. She has developed hypothyroidism and is on synthroid replacement. She has become more sedentary as she has been taking care of her husband.  Allergies  Allergen Reactions  . Eliquis [Apixaban] Other (See Comments)    Dizziness, severe headach  . Amlodipine     Dizzy, nausea  . Augmentin [Amoxicillin-Pot Clavulanate] Other (See Comments)    Gi upset only no rash or hives   . Cortisone Other (See Comments)    Makes blood sugars run high  . Keflex [Cephalexin] Hives and Nausea Only    Stomach upset  . Omeprazole Nausea Only    Dizziness and nausea  . Prednisone     Other reaction(s): Other Makes blood sugars run high  . Hydrocodone Rash    Rash to chest , side back  . Latex Rash    Powder in gloves causes rash; "not allergic to latex just the powder"     Current Outpatient Medications  Medication Sig Dispense Refill  . ALPRAZolam (XANAX) 0.5 MG tablet TAKE 1/2 TABLET TWICE DAILY AS NEEDED FOR ANXIETY OR SLEEP 30 tablet 5  . aspirin EC 81 MG tablet Take 81 mg by mouth daily.    . B Complex-C (SUPER B COMPLEX PO) Take 1 tablet by mouth daily.    . BD VEO INSULIN SYRINGE U/F 31G X 15/64" 0.3 ML MISC USE WITH INJECTIONS 100 each 2  . bisacodyl (DULCOLAX) 10 MG suppository Place 1 suppository (10 mg total) rectally as needed for moderate constipation. 12 suppository 0  . carboxymethylcellulose (REFRESH PLUS) 0.5 % SOLN Place 1 drop into both eyes 3 (three) times daily as needed. Non-preservative    . cholecalciferol (VITAMIN D) 1000 UNITS tablet Take 5,000 Units by mouth daily.     . Continuous Blood Gluc Sensor ([place]) MISC by Does not apply route. Sensor is located in  Right arm    . fish oil-omega-3 fatty acids 1000 MG capsule Take 2 g by mouth daily.    [name] glucose blood test strip OneTouch Ultra Blue Test Strip    . hydrochlorothiazide (HYDRODIURIL) 25 MG tablet TAKE (1) TABLET DAILY AS NEEDED. 90 tablet 0  . insulin glargine (LANTUS) 100 UNIT/ML Solostar Pen Inject 14 Units into the skin daily at 10 pm.     . insulin lispro (HUMALOG) 100 UNIT/ML KiwkPen Inject into the skin. Sliding scale before meal    . Insulin Pen Needle (BD PEN NEEDLE NANO 2ND GEN) 32G X 4 MM MISC INJECT AS DIRECTED FOUR TIMES A DAY    . levothyroxine (SYNTHROID) 112 MCG tablet     . losartan (COZAAR) 100 MG tablet TAKE 1 TABLET ONCE DAILY. 90 tablet 0  . metoprolol succinate (TOPROL-XL) 25 MG 24 hr tablet Take 0.5 tablets (12.5 mg total) by mouth daily as needed. For palpitations. 45 tablet 3  . Multiple Vitamin (MULTIVITAMIN) tablet Take 1 tablet by mouth daily. For breast and bone health    . ondansetron (ZOFRAN-ODT) 4 MG disintegrating tablet     . saccharomyces boulardii (FLORASTOR) 250 MG capsule Take 1 capsule (250 mg total) by mouth 2 (two) times daily. 60 capsule 5  . simvastatin (ZOCOR) 20  MG tablet TAKE ONE TABLET AT BEDTIME. 90 tablet 1   No current facility-administered medications for this visit.     Past Medical History:  Diagnosis Date  . Anxiety   . Arthritis    fingers  . Bowel obstruction ([place])   . Cancer of upper-outer quadrant of female breast ([place]) 08/21/2011   ER +  PR +  Her 2 -  Ki67 9%   0.9 cm invasive lobular  Carcinoma ,s/p central lumpectomy with sentinel node biopsy,  ER/PR positive s/p re-excion on 09/16/11 with final pathology showing atypical hyperplasia   . CAP (community acquired pneumonia) 12/06/2016  . Diabetes mellitus type 1 (H[facility])    type I- wears insulin pump  . GERD (gastroesophageal reflux disease)   . History of small bowel obstruction   . Hypercholesteremia   . Hypergastrinemia   . Hypertension   . Hypertension   . Hyponatremia    . Hypothyroid   . Hypothyroidism   . IBS (irritable bowel syndrome)   . Osteoarthritis   . Osteoporosis, post-menopausal    DEXA 12/02/11: -3.5 (with lomax gyn), declines bisphos due to bone pain  . PAF (paroxysmal atrial fibrillation) ([place])    One episode in 2008, spontaneously converted in the ED, no further treatment  . Spondylosis    thoracic spine  . Thyroid nodule dx 07/2011   [place] q 1mo to follow calcified L thyroid nodule (12/08/11 [place])  . Umbilical hernia     ROS:   All systems reviewed and negative except as noted in the HPI.   Past Surgical History:  Procedure Laterality Date  . ABDOMINAL HYSTERECTOMY    . ABDOMINAL HYSTERECTOMY  yrs ago  . APPENDECTOMY    . APPENDECTOMY  age 43  . BLADDER REPAIR  5 yrs ago  . BREAST LUMPECTOMY    . BREAST SURGERY     biopsy  . BREAST SURGERY   yrs ago   br bx  . BREAST SURGERY  01/ 23/2013   left central lumpectomy and snbx, re-excision lumpectomy  . CARDIOVASCULAR STRESS TEST  03/17/2012  . colon surgery     Small intestines- took 6 inches out  . COLONOSCOPY  07/11/2015   [facility] Dr Derrill Kay  . ESOPHAGOGASTRODUODENOSCOPY  07/17/2010   [facility]  . LEFT HEART CATHETERIZATION WITH CORONARY ANGIOGRAM N/A 03/18/2012   Procedure: LEFT HEART CATHETERIZATION WITH CORONARY ANGIOGRAM;  Surgeon: Minus Breeding, MD;  Location[facility] CATH LAB;  Service: Cardiovascular;  Laterality: N/A;  . TONSILLECTOMY   age 65     Family History  Problem Relation Age of Onset  . Cancer Sister        unknown  . Hypertension Mother   . Stroke Mother   . Arthritis Father   . Heart disease Father   . Diabetes Son   . Colon cancer Neg Hx   . Esophageal cancer Neg Hx      Social History   Socioeconomic History  . Marital status: Married    Spouse name: Not on file  . Number of children: Not on file  . Years of education: Not on file  . Highest education level: Not on file  Occupational History  . Not on file  Tobacco Use  . Smoking  status: Former Smoker    Packs/day: 1.00    Years: 10.00    Pack years: 10.00    Types: Cigarettes    Quit date: 08/27/1977    Years since quitting: 43.2  . Smokeless  tobacco: Never Used  Vaping Use  . Vaping Use: Never used  Substance and Sexual Activity  . Alcohol use: Yes    Comment: rare  . Drug use: No  . Sexual activity: Yes    Birth control/protection: Post-menopausal  Other Topics Concern  . Not on file  Social History Narrative   ** Merged History Encounter **       Social Determinants of Health   Financial Resource Strain: Not on file  Food Insecurity: Not on file  Transportation Needs: Not on file  Physical Activity: Not on file  Stress: Not on file  Social Connections: Not on file  Intimate Partner Violence: Not on file     BP (!) 163/77 Comment: RIGHT FOREARM  Pulse 82   Ht $R'5\' 7"'tz$  (1.702 m)   Wt 134 lb (60.8 kg)   SpO2 94%   BMI 20.99 kg/m   Physical Exam:  Well appearing NAD HEENT: Unremarkable Neck:  No JVD, no thyromegally Lymphatics:  No adenopathy Back:  No CVA tenderness Lungs:  Clear HEART:  Regular rate rhythm, no murmurs, no rubs, no clicks Abd:  soft, positive bowel sounds, no organomegally, no rebound, no guarding Ext:  2 plus pulses, no edema, no cyanosis, no clubbing Skin:  No rashes no nodules Neuro:  CN II through XII intact, motor grossly intact  EKG  DEVICE  Normal device function.  See PaceArt for details.   Assess/Plan: 1. PAF - she has minimal symptoms and continues to refuse systemic anti-coagulation. 2. HTN - her SBP remains elevated despite good medical therapy.  3. Thyroid dysfunction - she is clinically euthyroid. We will follow.  Carleene Overlie Emmanual Gauthreaux,MD

## 2020-11-12 NOTE — Patient Instructions (Addendum)
Medication Instructions:  Your physician recommends that you continue on your current medications as directed. Please refer to the Current Medication list given to you today.  Labwork: None ordered.  Testing/Procedures: None ordered.  Follow-Up: Your physician wants you to follow-up in: 9 months December 2022 with Cristopher Peru, MD or one of the following Advanced Practice Providers on your designated Care Team:    Chanetta Marshall, NP  Tommye Standard, [place]  Legrand Como "Jonni Sanger" Chalmers Cater, [place]  Any Other Special Instructions Will Be Listed Below (If Applicable).  If you need a refill on your cardiac medications before your next appointment, please call your pharmacy.

## 2020-11-26 ENCOUNTER — Other Ambulatory Visit: Payer: Self-pay | Admitting: Internal Medicine

## 2020-12-05 DIAGNOSIS — E1065 Type 1 diabetes mellitus with hyperglycemia: Secondary | ICD-10-CM | POA: Diagnosis not present

## 2020-12-16 DIAGNOSIS — E063 Autoimmune thyroiditis: Secondary | ICD-10-CM | POA: Diagnosis not present

## 2020-12-16 DIAGNOSIS — E038 Other specified hypothyroidism: Secondary | ICD-10-CM | POA: Diagnosis not present

## 2020-12-19 DIAGNOSIS — E119 Type 2 diabetes mellitus without complications: Secondary | ICD-10-CM | POA: Diagnosis not present

## 2020-12-19 DIAGNOSIS — H52223 Regular astigmatism, bilateral: Secondary | ICD-10-CM | POA: Diagnosis not present

## 2020-12-19 DIAGNOSIS — H25011 Cortical age-related cataract, right eye: Secondary | ICD-10-CM | POA: Diagnosis not present

## 2020-12-19 DIAGNOSIS — I10 Essential (primary) hypertension: Secondary | ICD-10-CM | POA: Diagnosis not present

## 2020-12-19 DIAGNOSIS — H25811 Combined forms of age-related cataract, right eye: Secondary | ICD-10-CM | POA: Diagnosis not present

## 2020-12-19 DIAGNOSIS — H524 Presbyopia: Secondary | ICD-10-CM | POA: Diagnosis not present

## 2020-12-19 DIAGNOSIS — H35033 Hypertensive retinopathy, bilateral: Secondary | ICD-10-CM | POA: Diagnosis not present

## 2020-12-19 DIAGNOSIS — H5213 Myopia, bilateral: Secondary | ICD-10-CM | POA: Diagnosis not present

## 2020-12-19 DIAGNOSIS — H353131 Nonexudative age-related macular degeneration, bilateral, early dry stage: Secondary | ICD-10-CM | POA: Diagnosis not present

## 2020-12-19 LAB — HM DIABETES EYE EXAM

## 2020-12-24 ENCOUNTER — Encounter: Payer: Self-pay | Admitting: Internal Medicine

## 2020-12-31 DIAGNOSIS — F419 Anxiety disorder, unspecified: Secondary | ICD-10-CM | POA: Insufficient documentation

## 2020-12-31 NOTE — Progress Notes (Signed)
Subjective:    Patient ID: [name], female    DOB: 01-28-1929, 87 y.o.   MRN: [id number]  HPI The patient is here for follow up of their chronic medical problems, including htn, DM, hypothyroidism, constipation, hld, B12 def, anxiety   She is doing fairly well overall.  She does still have the pain in her left upper quadrant-she does not know what it is.  Years ago she had similar pain and she had extensive work-up and they discovered it was an intercostal muscle that was causing the pain.  She had an injection at that time and stop the pain.  The pain is much better and manageable at this time.   Medications and allergies reviewed with patient and updated if appropriate.  Patient Active Problem List   Diagnosis Date Noted  . Anxiety 12/31/2020  . Left leg swelling 03/05/2020  . B12 deficiency 12/15/2019  . Tingling in extremities 10/04/2019  . Chronic thoracic back pain 08/16/2019  . Atrial fibrillation (HCC) 02/01/2018  . Leg cramps 07/05/2017  . Constipation 01/25/2017  . Closed fracture of proximal end of right humerus 07/09/2016  . Cough 06/17/2016  . Abdominal pain 03/10/2016  . Hearing loss 01/03/2016  . Allergic rhinitis 01/03/2016  . Minimal Coronary Plaque by Cardiac Cath in 2013 09/11/2015  . Frequent loose stools 08/28/2015  . Hyponatremia   . Acute mesenteric ischemia (HCC)   . Hypothyroidism 07/11/2012  . Syncope 03/18/2012  . Hypercholesteremia   . Hypertension   . Diabetes mellitus type 1 (HCC)   . Osteoarthritis   . Thyroid nodule   . Osteoporosis, post-menopausal   . Primary cancer of upper outer quadrant of left female breast (HCC) 08/21/2011    Current Outpatient Medications on File Prior to Visit  Medication Sig Dispense Refill  . ALPRAZolam (XANAX) 0.5 MG tablet TAKE 1/2 TABLET TWICE DAILY AS NEEDED FOR ANXIETY OR SLEEP 30 tablet 5  . aspirin EC 81 MG tablet Take 81 mg by mouth daily.    . B Complex-C (SUPER B COMPLEX PO) Take 1 tablet  by mouth daily.    . BD VEO INSULIN SYRINGE U/F 31G X 15/64" 0.3 ML MISC USE WITH INJECTIONS 100 each 2  . bisacodyl (DULCOLAX) 10 MG suppository Place 1 suppository (10 mg total) rectally as needed for moderate constipation. 12 suppository 0  . carboxymethylcellulose (REFRESH PLUS) 0.5 % SOLN Place 1 drop into both eyes 3 (three) times daily as needed. Non-preservative    . cholecalciferol (VITAMIN D) 1000 UNITS tablet Take 5,000 Units by mouth daily.     . Continuous Blood Gluc Sensor (FREESTYLE LIBRE SENSOR SYSTEM) MISC by Does not apply route. Sensor is located in Right arm    . fish oil-omega-3 fatty acids 1000 MG capsule Take 2 g by mouth daily.    [name] glucose blood test strip OneTouch Ultra Blue Test Strip    . hydrochlorothiazide (HYDRODIURIL) 25 MG tablet TAKE (1) TABLET DAILY AS NEEDED. 90 tablet 0  . insulin glargine (LANTUS) 100 UNIT/ML Solostar Pen Inject 14 Units into the skin daily at 10 pm.     . insulin lispro (HUMALOG) 100 UNIT/ML KiwkPen Inject into the skin. Sliding scale before meal    . Insulin Pen Needle (BD PEN NEEDLE NANO 2ND GEN) 32G X 4 MM MISC INJECT AS DIRECTED FOUR TIMES A DAY    . levothyroxine (SYNTHROID) 112 MCG tablet     . losartan (COZAAR) 100 MG tablet TAKE 1 TABLET  ONCE DAILY. 90 tablet 0  . metoprolol succinate (TOPROL-XL) 25 MG 24 hr tablet Take 0.5 tablets (12.5 mg total) by mouth daily as needed. For palpitations. 45 tablet 3  . Multiple Vitamin (MULTIVITAMIN) tablet Take 1 tablet by mouth daily. For breast and bone health    . saccharomyces boulardii (FLORASTOR) 250 MG capsule Take 1 capsule (250 mg total) by mouth 2 (two) times daily. 60 capsule 5  . simvastatin (ZOCOR) 20 MG tablet TAKE ONE TABLET AT BEDTIME. 90 tablet 1   No current facility-administered medications on file prior to visit.    Past Medical History:  Diagnosis Date  . Anxiety   . Arthritis    fingers  . Bowel obstruction ([place])   . Cancer of upper-outer quadrant of female breast  ([place]) 08/21/2011   ER +  PR +  Her 2 -  Ki67 9%   0.9 cm invasive lobular  Carcinoma ,s/p central lumpectomy with sentinel node biopsy,  ER/PR positive s/p re-excion on 09/16/11 with final pathology showing atypical hyperplasia   . CAP (community acquired pneumonia) 12/06/2016  . Diabetes mellitus type 1 (HCC)    type I- wears insulin pump  . GERD (gastroesophageal reflux disease)   . History of small bowel obstruction   . Hypercholesteremia   . Hypergastrinemia   . Hypertension   . Hypertension   . Hyponatremia   . Hypothyroid   . Hypothyroidism   . IBS (irritable bowel syndrome)   . Osteoarthritis   . Osteoporosis, post-menopausal    DEXA 12/02/11: -3.5 (with lomax gyn), declines bisphos due to bone pain  . PAF (paroxysmal atrial fibrillation) ([place])    One episode in 2008, spontaneously converted in the ED, no further treatment  . Spondylosis    thoracic spine  . Thyroid nodule dx 07/2011   [place] q 13mo to follow calcified L thyroid nodule (12/08/11 [place])  . Umbilical hernia     Past Surgical History:  Procedure Laterality Date  . ABDOMINAL HYSTERECTOMY    . ABDOMINAL HYSTERECTOMY  yrs ago  . APPENDECTOMY    . APPENDECTOMY  age 72  . BLADDER REPAIR  5 yrs ago  . BREAST LUMPECTOMY    . BREAST SURGERY     biopsy  . BREAST SURGERY   yrs ago   br bx  . BREAST SURGERY  01/ 23/2013   left central lumpectomy and snbx, re-excision lumpectomy  . CARDIOVASCULAR STRESS TEST  03/17/2012  . colon surgery     Small intestines- took 6 inches out  . COLONOSCOPY  07/11/2015   [facility] Dr Derrill Kay  . ESOPHAGOGASTRODUODENOSCOPY  07/17/2010   [facility]  . LEFT HEART CATHETERIZATION WITH CORONARY ANGIOGRAM N/A 03/18/2012   Procedure: LEFT HEART CATHETERIZATION WITH CORONARY ANGIOGRAM;  Surgeon: Minus Breeding, MD;  Location: [facility] CATH LAB;  Service: Cardiovascular;  Laterality: N/A;  . TONSILLECTOMY   age 66    Social History   Socioeconomic History  . Marital status: Married    Spouse name:  Not on file  . Number of children: Not on file  . Years of education: Not on file  . Highest education level: Not on file  Occupational History  . Not on file  Tobacco Use  . Smoking status: Former Smoker    Packs/day: 1.00    Years: 10.00    Pack years: 10.00    Types: Cigarettes    Quit date: 08/27/1977    Years since quitting: 43.3  . Smokeless tobacco: Never  Used  Vaping Use  . Vaping Use: Never used  Substance and Sexual Activity  . Alcohol use: Yes    Comment: rare  . Drug use: No  . Sexual activity: Yes    Birth control/protection: Post-menopausal  Other Topics Concern  . Not on file  Social History Narrative   ** Merged History Encounter **       Social Determinants of Health   Financial Resource Strain: Not on file  Food Insecurity: Not on file  Transportation Needs: Not on file  Physical Activity: Not on file  Stress: Not on file  Social Connections: Not on file    Family History  Problem Relation Age of Onset  . Cancer Sister        unknown  . Hypertension Mother   . Stroke Mother   . Arthritis Father   . Heart disease Father   . Diabetes Son   . Colon cancer Neg Hx   . Esophageal cancer Neg Hx     Review of Systems  Constitutional: Negative for fever.  Respiratory: Negative for cough, shortness of breath and wheezing.   Cardiovascular: Positive for chest pain (occ - not cardiac) and palpitations. Negative for leg swelling.  Neurological: Negative for light-headedness and headaches.       Objective:   Vitals:   01/01/21 1309  BP: (!) 148/74  Pulse: 88  Temp: 98.3 F (36.8 C)  SpO2: 91%   BP Readings from Last 3 Encounters:  01/01/21 (!) 148/74  11/12/20 (!) 163/77  10/30/20 (!) 171/68   Wt Readings from Last 3 Encounters:  01/01/21 129 lb (58.5 kg)  11/12/20 134 lb (60.8 kg)  10/30/20 134 lb 14.4 oz (61.2 kg)   Body mass index is 20.2 kg/m.   Physical Exam    Constitutional: Appears well-developed and well-nourished. No  distress.  HENT:  Head: Normocephalic and atraumatic.  Neck: Neck supple. No tracheal deviation present. No thyromegaly present.  No cervical lymphadenopathy Cardiovascular: Normal rate, regular rhythm and normal heart sounds.   No murmur heard. No carotid bruit .  No edema Pulmonary/Chest: Effort normal and breath sounds normal. No respiratory distress. No has no wheezes. No rales.  Skin: Skin is warm and dry. Not diaphoretic.  Psychiatric: Normal mood and affect. Behavior is normal.      Assessment & Plan:    See Problem List for Assessment and Plan of chronic medical problems.    This visit occurred during the SARS-CoV-2 public health emergency.  Safety protocols were in place, including screening questions prior to the visit, additional usage of staff PPE, and extensive cleaning of exam room while observing appropriate contact time as indicated for disinfecting solutions.

## 2020-12-31 NOTE — Patient Instructions (Addendum)
    Medications changes include :   Try Voltaren gel for your knee pain.  You can take Tylenol daily for your knee pain - you can take up to 3000 mg a day     Please followup in 6 months

## 2021-01-01 ENCOUNTER — Other Ambulatory Visit: Payer: Self-pay

## 2021-01-01 ENCOUNTER — Encounter: Payer: Self-pay | Admitting: Internal Medicine

## 2021-01-01 ENCOUNTER — Ambulatory Visit: Payer: Medicare PPO | Admitting: Internal Medicine

## 2021-01-01 VITALS — BP 148/74 | HR 88 | Temp 98.3°F | Ht 67.0 in | Wt 129.0 lb

## 2021-01-01 DIAGNOSIS — I480 Paroxysmal atrial fibrillation: Secondary | ICD-10-CM

## 2021-01-01 DIAGNOSIS — F419 Anxiety disorder, unspecified: Secondary | ICD-10-CM | POA: Diagnosis not present

## 2021-01-01 DIAGNOSIS — E1043 Type 1 diabetes mellitus with diabetic autonomic (poly)neuropathy: Secondary | ICD-10-CM | POA: Diagnosis not present

## 2021-01-01 DIAGNOSIS — E538 Deficiency of other specified B group vitamins: Secondary | ICD-10-CM | POA: Diagnosis not present

## 2021-01-01 DIAGNOSIS — E039 Hypothyroidism, unspecified: Secondary | ICD-10-CM

## 2021-01-01 DIAGNOSIS — I10 Essential (primary) hypertension: Secondary | ICD-10-CM | POA: Diagnosis not present

## 2021-01-01 DIAGNOSIS — K5904 Chronic idiopathic constipation: Secondary | ICD-10-CM

## 2021-01-01 DIAGNOSIS — E7800 Pure hypercholesterolemia, unspecified: Secondary | ICD-10-CM

## 2021-01-01 NOTE — Assessment & Plan Note (Signed)
Chronic  Clinically euthyroid Management per endo

## 2021-01-01 NOTE — Assessment & Plan Note (Addendum)
Chronic Taking miralax and metamucil daily controlled

## 2021-01-01 NOTE — Assessment & Plan Note (Addendum)
Chronic Management per endocrine Last A1c 2 months ago 7.3%

## 2021-01-01 NOTE — Assessment & Plan Note (Signed)
Chronic Monthly B12 injections

## 2021-01-01 NOTE — Assessment & Plan Note (Signed)
Chronic Check lipid panel  Continue simvastatin 20 mg daily Regular exercise and healthy diet encouraged  

## 2021-01-01 NOTE — Assessment & Plan Note (Signed)
Chronic Controlled Continue xanax 0.25 mg bid prn

## 2021-01-01 NOTE — Assessment & Plan Note (Signed)
Chronic Has intermittent palpitations and takes metoprolol as needed, which works well Is not on anticoagulation-did not tolerate it and does not want to be on it Following with cardiology

## 2021-01-01 NOTE — Assessment & Plan Note (Signed)
Chronic BP well controlled Continue hctz 25 mg qd, losartan 100 mg qd cmp

## 2021-01-24 ENCOUNTER — Encounter: Payer: Self-pay | Admitting: Internal Medicine

## 2021-01-24 NOTE — Progress Notes (Unsigned)
Outside notes received. Information abstracted. Notes sent to scan.  

## 2021-02-04 DIAGNOSIS — E1069 Type 1 diabetes mellitus with other specified complication: Secondary | ICD-10-CM | POA: Diagnosis not present

## 2021-02-04 DIAGNOSIS — E1159 Type 2 diabetes mellitus with other circulatory complications: Secondary | ICD-10-CM | POA: Diagnosis not present

## 2021-02-04 DIAGNOSIS — E063 Autoimmune thyroiditis: Secondary | ICD-10-CM | POA: Diagnosis not present

## 2021-02-04 DIAGNOSIS — E1042 Type 1 diabetes mellitus with diabetic polyneuropathy: Secondary | ICD-10-CM | POA: Diagnosis not present

## 2021-02-04 DIAGNOSIS — E785 Hyperlipidemia, unspecified: Secondary | ICD-10-CM | POA: Diagnosis not present

## 2021-02-04 DIAGNOSIS — E038 Other specified hypothyroidism: Secondary | ICD-10-CM | POA: Diagnosis not present

## 2021-02-04 DIAGNOSIS — I152 Hypertension secondary to endocrine disorders: Secondary | ICD-10-CM | POA: Diagnosis not present

## 2021-02-11 ENCOUNTER — Telehealth: Payer: Self-pay | Admitting: Internal Medicine

## 2021-02-11 NOTE — Telephone Encounter (Signed)
Patient declined AWV 

## 2021-02-20 ENCOUNTER — Other Ambulatory Visit: Payer: Self-pay | Admitting: Internal Medicine

## 2021-02-24 DIAGNOSIS — E1065 Type 1 diabetes mellitus with hyperglycemia: Secondary | ICD-10-CM | POA: Diagnosis not present

## 2021-02-25 ENCOUNTER — Other Ambulatory Visit: Payer: Self-pay | Admitting: Hematology and Oncology

## 2021-02-25 DIAGNOSIS — Z1231 Encounter for screening mammogram for malignant neoplasm of breast: Secondary | ICD-10-CM

## 2021-02-28 ENCOUNTER — Ambulatory Visit: Payer: Medicare PPO

## 2021-03-05 ENCOUNTER — Ambulatory Visit
Admission: RE | Admit: 2021-03-05 | Discharge: 2021-03-05 | Disposition: A | Discharge status: Home / Self Care | Payer: Medicare PPO | Source: Ambulatory Visit | Attending: Hematology and Oncology | Admitting: Hematology and Oncology

## 2021-03-05 ENCOUNTER — Other Ambulatory Visit: Payer: Self-pay

## 2021-03-05 DIAGNOSIS — Z1231 Encounter for screening mammogram for malignant neoplasm of breast: Secondary | ICD-10-CM

## 2021-03-20 ENCOUNTER — Other Ambulatory Visit: Payer: Self-pay | Admitting: Internal Medicine

## 2021-03-24 IMAGING — DX DG NECK SOFT TISSUE
2 series · 2 of 2 positions shown · non-contrast
Comparison: CT 06/25/2016

CLINICAL DATA: Posterior neck pain

EXAM:
NECK SOFT TISSUES - 1+ VIEW

[neck lat]
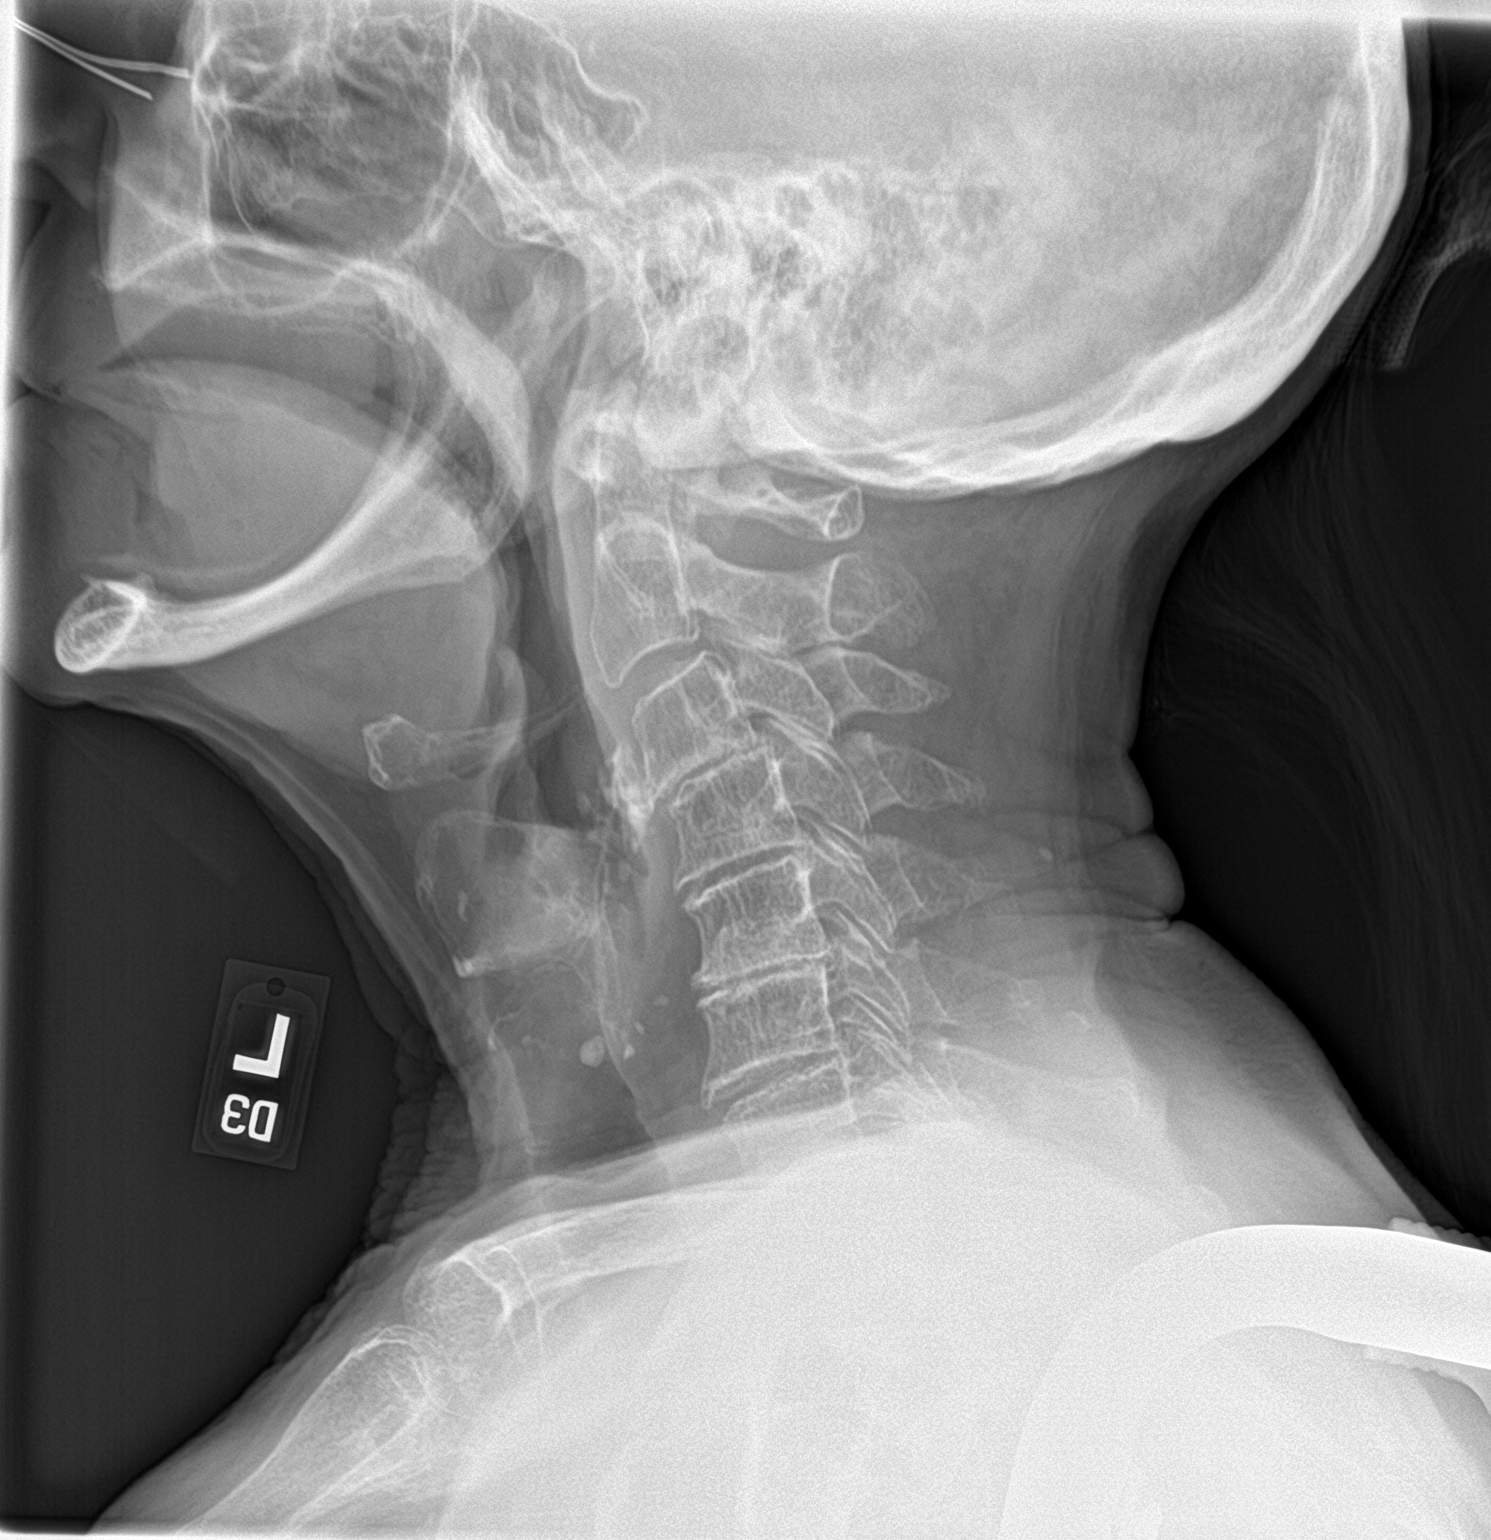

[neck ap]
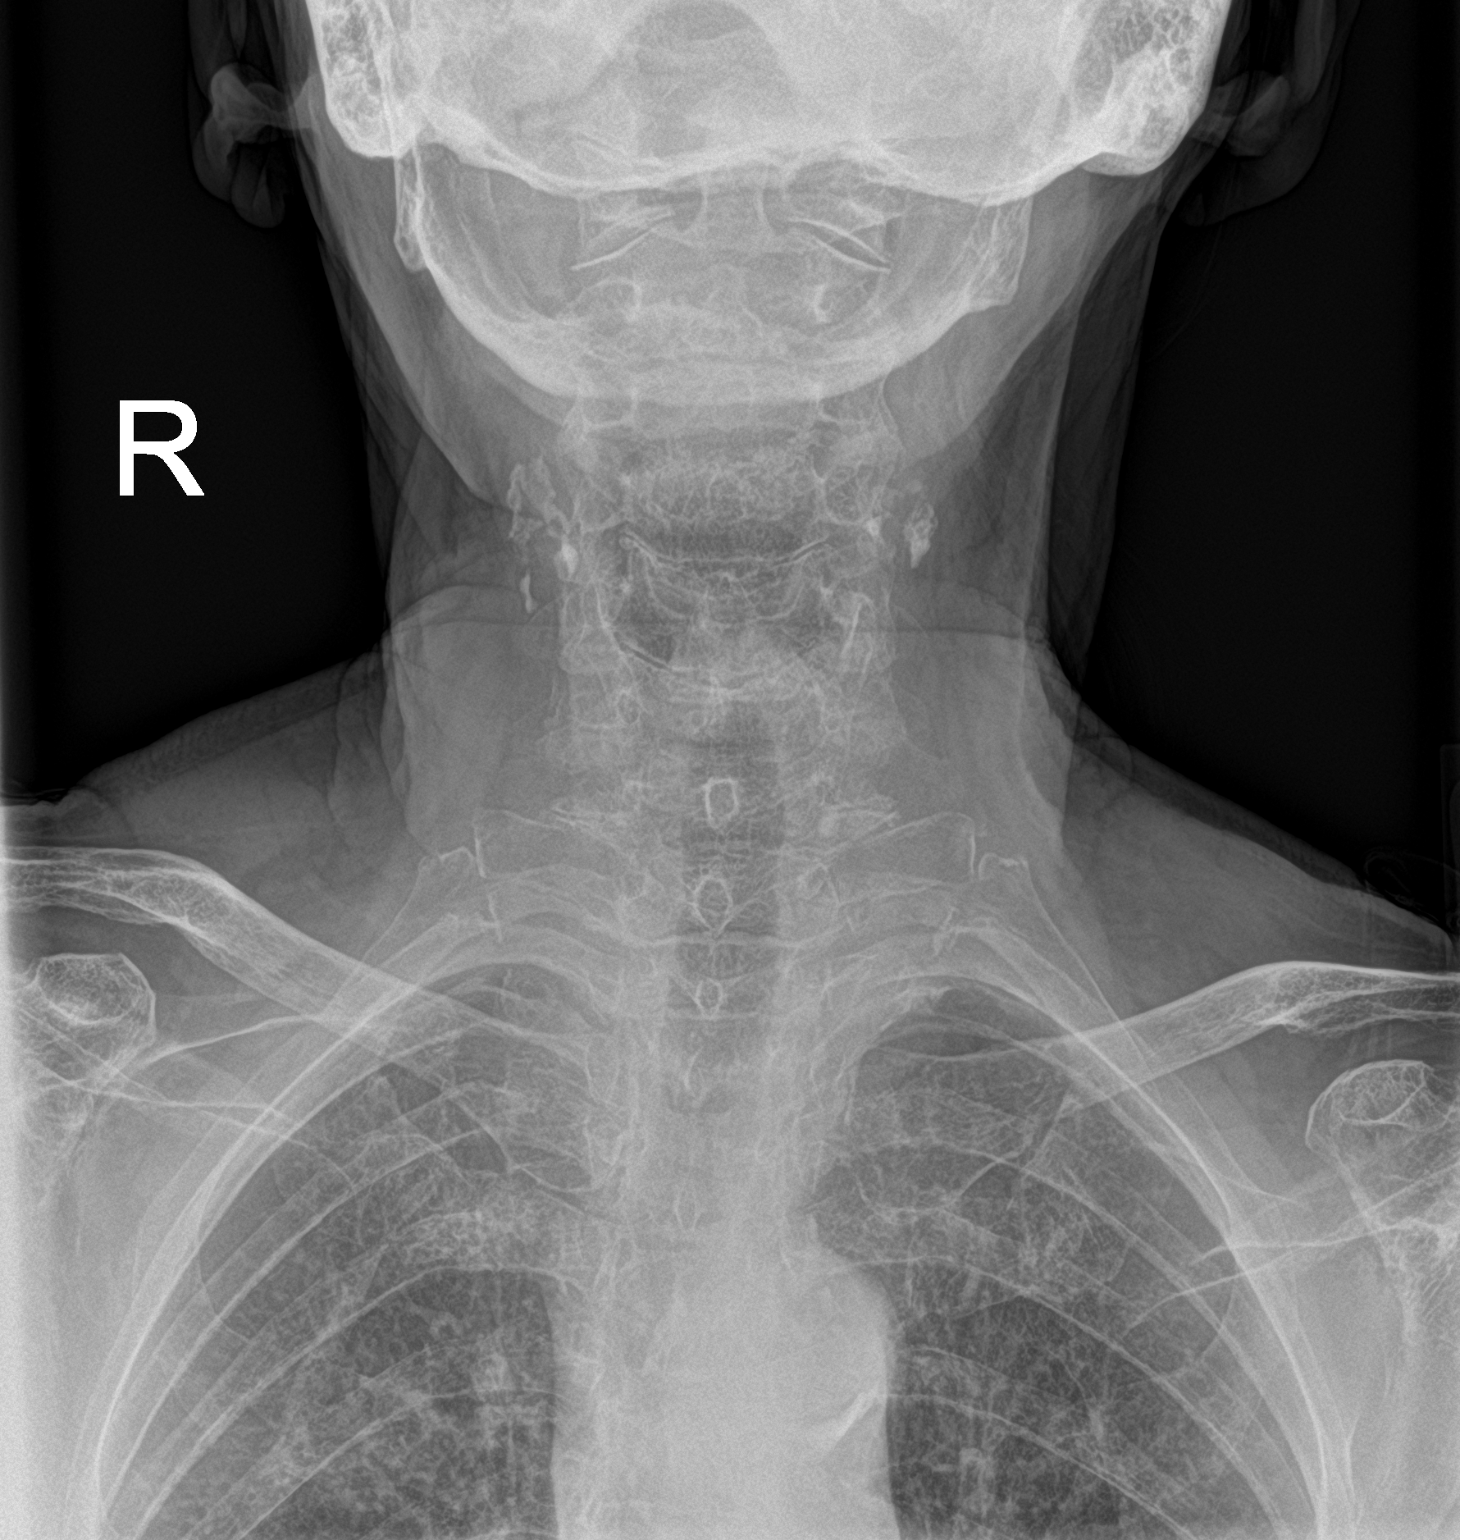

[2 of 2 positions shown; findings below may reference images not displayed]

FINDINGS: There is no evidence of retropharyngeal soft tissue swelling or
epiglottic enlargement. The cervical airway is unremarkable and no
radio-opaque foreign body identified. Multilevel degenerative disc
disease throughout the cervical spine with mild anterior endplate
spurring. Atherosclerotic calcification of the carotid bulbs
bilaterally. Aortic atherosclerosis is also evident.
IMPRESSION: 1. No acute soft tissue abnormality of the neck.
2. Cervical spondylosis with mild anterior endplate spurring.
3. Carotid artery and aortic atherosclerosis.

## 2021-03-29 DIAGNOSIS — R0981 Nasal congestion: Secondary | ICD-10-CM | POA: Diagnosis not present

## 2021-03-29 DIAGNOSIS — Z20828 Contact with and (suspected) exposure to other viral communicable diseases: Secondary | ICD-10-CM | POA: Diagnosis not present

## 2021-03-29 DIAGNOSIS — J069 Acute upper respiratory infection, unspecified: Secondary | ICD-10-CM | POA: Diagnosis not present

## 2021-04-01 DIAGNOSIS — H2511 Age-related nuclear cataract, right eye: Secondary | ICD-10-CM | POA: Diagnosis not present

## 2021-04-01 DIAGNOSIS — H26492 Other secondary cataract, left eye: Secondary | ICD-10-CM | POA: Diagnosis not present

## 2021-04-01 DIAGNOSIS — H353132 Nonexudative age-related macular degeneration, bilateral, intermediate dry stage: Secondary | ICD-10-CM | POA: Diagnosis not present

## 2021-04-15 ENCOUNTER — Other Ambulatory Visit: Payer: Self-pay | Admitting: Internal Medicine

## 2021-04-15 DIAGNOSIS — F411 Generalized anxiety disorder: Secondary | ICD-10-CM

## 2021-04-21 NOTE — Progress Notes (Signed)
Subjective:    Patient ID: [name], female    DOB: 15-Feb-1929, 87 y.o.   MRN: [id number]  This visit occurred during the SARS-CoV-2 public health emergency.  Safety protocols were in place, including screening questions prior to the visit, additional usage of staff PPE, and extensive cleaning of exam room while observing appropriate contact time as indicated for disinfecting solutions.    HPI The patient is here for an acute visit.   Right ear ache, knot behind ear -   the right ear started itching the beginning of last week and then started aching last week.  Her husband put drops in her right ear for a few nights and it has helped.  The left ear has started to itch.     Her left posterior neck is stiff, sore.  She can not turn her head to the left very far and it does hurt.  This started over a year ago and she has seen someone for this but nothing is helped.  She does use sleep.   Medications and allergies reviewed with patient and updated if appropriate.  Patient Active Problem List   Diagnosis Date Noted   Anxiety 12/31/2020   Left leg swelling 03/05/2020   B12 deficiency 12/15/2019   Tingling in extremities 10/04/2019   Chronic thoracic back pain 08/16/2019   Atrial fibrillation ([place]) 02/01/2018   Leg cramps 07/05/2017   Constipation 01/25/2017   Closed fracture of proximal end of right humerus 07/09/2016   Cough 06/17/2016   Abdominal pain 03/10/2016   Hearing loss 01/03/2016   Allergic rhinitis 01/03/2016   Minimal Coronary Plaque by Cardiac Cath in 2013 09/11/2015   Frequent loose stools 08/28/2015   Hyponatremia    Acute mesenteric ischemia ([place])    Hypothyroidism 07/11/2012   Syncope 03/18/2012   Hypercholesteremia    Hypertension    Diabetes mellitus type 1 ([place])    Osteoarthritis    Thyroid nodule    Osteoporosis, post-menopausal    Primary cancer of upper outer quadrant of left female breast ([place]) 08/21/2011    Current Outpatient Medications  on File Prior to Visit  Medication Sig Dispense Refill   ALPRAZolam (XANAX) 0.5 MG tablet TAKE 1/2 TABLET TWICE DAILY AS NEEDED FOR ANXIETY OR SLEEP 30 tablet 5   aspirin EC 81 MG tablet Take 81 mg by mouth daily.     B Complex-C (SUPER B COMPLEX PO) Take 1 tablet by mouth daily.     BD VEO INSULIN SYRINGE U/F 31G X 15/64" 0.3 ML MISC USE WITH INJECTIONS 100 each 2   bisacodyl (DULCOLAX) 10 MG suppository Place 1 suppository (10 mg total) rectally as needed for moderate constipation. 12 suppository 0   carboxymethylcellulose (REFRESH PLUS) 0.5 % SOLN Place 1 drop into both eyes 3 (three) times daily as needed. Non-preservative     cholecalciferol (VITAMIN D) 1000 UNITS tablet Take 5,000 Units by mouth daily.      Continuous Blood Gluc Sensor ([place]) MISC by Does not apply route. Sensor is located in Right arm     fish oil-omega-3 fatty acids 1000 MG capsule Take 2 g by mouth daily.     glucose blood test strip OneTouch Ultra Blue Test Strip     hydrochlorothiazide (HYDRODIURIL) 25 MG tablet TAKE (1) TABLET DAILY AS NEEDED. 90 tablet 0   insulin glargine (LANTUS) 100 UNIT/ML Solostar Pen Inject 14 Units into the skin daily at 10 pm.  insulin lispro (HUMALOG) 100 UNIT/ML KiwkPen Inject into the skin. Sliding scale before meal     Insulin Pen Needle (BD PEN NEEDLE NANO 2ND GEN) 32G X 4 MM MISC INJECT AS DIRECTED FOUR TIMES A DAY     levothyroxine (SYNTHROID) 112 MCG tablet      losartan (COZAAR) 100 MG tablet TAKE 1 TABLET ONCE DAILY. 90 tablet 0   metoprolol succinate (TOPROL-XL) 25 MG 24 hr tablet TAKE 1/2 TABLET AS NEEDED FOR PALPITATIONS. 45 tablet 3   Multiple Vitamin (MULTIVITAMIN) tablet Take 1 tablet by mouth daily. For breast and bone health     saccharomyces boulardii (FLORASTOR) 250 MG capsule Take 1 capsule (250 mg total) by mouth 2 (two) times daily. 60 capsule 5   simvastatin (ZOCOR) 20 MG tablet TAKE ONE TABLET AT BEDTIME. 90 tablet 1   DUREZOL 0.05 %  EMUL Place 1 drop into the right eye 3 (three) times daily.     moxifloxacin (VIGAMOX) 0.5 % ophthalmic solution Place 1 drop into the right eye 4 (four) times daily.     No current facility-administered medications on file prior to visit.    Past Medical History:  Diagnosis Date   Anxiety    Arthritis    fingers   Bowel obstruction (HCC)    Cancer of upper-outer quadrant of female breast (HCC) 08/21/2011   ER +  PR +  Her 2 -  Ki67 9%   0.9 cm invasive lobular  Carcinoma ,s/p central lumpectomy with sentinel node biopsy,  ER/PR positive s/p re-excion on 09/16/11 with final pathology showing atypical hyperplasia    CAP (community acquired pneumonia) 12/06/2016   Diabetes mellitus type 1 (HCC)    type I- wears insulin pump   GERD (gastroesophageal reflux disease)    History of small bowel obstruction    Hypercholesteremia    Hypergastrinemia    Hypertension    Hypertension    Hyponatremia    Hypothyroid    Hypothyroidism    IBS (irritable bowel syndrome)    Osteoarthritis    Osteoporosis, post-menopausal    DEXA 12/02/11: -3.5 (with lomax gyn), declines bisphos due to bone pain   PAF (paroxysmal atrial fibrillation) (HCC)    One episode in 2008, spontaneously converted in the ED, no further treatment   Spondylosis    thoracic spine   Thyroid nodule dx 07/2011   [place] q 69mo to follow calcified L thyroid nodule (12/08/11 [place])   Umbilical hernia     Past Surgical History:  Procedure Laterality Date   ABDOMINAL HYSTERECTOMY     ABDOMINAL HYSTERECTOMY  yrs ago   APPENDECTOMY     APPENDECTOMY  age 7   BLADDER REPAIR  5 yrs ago   BREAST LUMPECTOMY     BREAST SURGERY     biopsy   BREAST SURGERY   yrs ago   br bx   BREAST SURGERY  01/ 23/2013   left central lumpectomy and snbx, re-excision lumpectomy   CARDIOVASCULAR STRESS TEST  03/17/2012   colon surgery     Small intestines- took 6 inches out   COLONOSCOPY  07/11/2015   [facility] Dr Alycia Rossetti   ESOPHAGOGASTRODUODENOSCOPY   07/17/2010   [facility]   LEFT HEART CATHETERIZATION WITH CORONARY ANGIOGRAM N/A 03/18/2012   Procedure: LEFT HEART CATHETERIZATION WITH CORONARY ANGIOGRAM;  Surgeon: Rollene Rotunda, MD;  Location: [facility] CATH LAB;  Service: Cardiovascular;  Laterality: N/A;   TONSILLECTOMY   age 48    Social History  Socioeconomic History   Marital status: Married    Spouse name: Not on file   Number of children: Not on file   Years of education: Not on file   Highest education level: Not on file  Occupational History   Not on file  Tobacco Use   Smoking status: Former    Packs/day: 1.00    Years: 10.00    Pack years: 10.00    Types: Cigarettes    Quit date: 08/27/1977    Years since quitting: 43.6   Smokeless tobacco: Never  Vaping Use   Vaping Use: Never used  Substance and Sexual Activity   Alcohol use: Yes    Comment: rare   Drug use: No   Sexual activity: Yes    Birth control/protection: Post-menopausal  Other Topics Concern   Not on file  Social History Narrative   ** Merged History Encounter **       Social Determinants of Health   Financial Resource Strain: Not on file  Food Insecurity: Not on file  Transportation Needs: Not on file  Physical Activity: Not on file  Stress: Not on file  Social Connections: Not on file    Family History  Problem Relation Age of Onset   Cancer Sister        unknown   Hypertension Mother    Stroke Mother    Arthritis Father    Heart disease Father    Diabetes Son    Colon cancer Neg Hx    Esophageal cancer Neg Hx     Review of Systems  Constitutional:  Negative for fever.  HENT:  Positive for congestion (mild), ear pain (right ear) and hearing loss (increased). Negative for sinus pain and sore throat.   Musculoskeletal:  Positive for neck pain and neck stiffness.  Neurological:  Positive for headaches (mild). Negative for dizziness and light-headedness.      Objective:   Vitals:   04/22/21 1018  BP: (!) 142/80  Pulse: 79   Temp: 98.1 F (36.7 C)  SpO2: 98%   BP Readings from Last 3 Encounters:  04/22/21 (!) 142/80  01/01/21 (!) 148/74  11/12/20 (!) 163/77   Wt Readings from Last 3 Encounters:  04/22/21 131 lb (59.4 kg)  01/01/21 129 lb (58.5 kg)  11/12/20 134 lb (60.8 kg)   Body mass index is 20.52 kg/m.   Physical Exam    GENERAL APPEARANCE: Appears stated age, well appearing, NAD EYES: conjunctiva clear, no icterus HENT: Bilateral ear canals with minimal erythema, trace wax bilateral ear canals.  Mild erythema right TM, left TM normal, oropharynx with no erythema or exudates, trachea midline, tender anterior right cervical lymphadenopathy LUNGS: Unlabored breathing, good air entry bilaterally, clear to auscultation without wheeze or crackles CARDIOVASCULAR: Normal S1,S2 , no edema MSK: Significant muscle tightness and tenderness left trapezius, decreased range of motion of neck SKIN: Warm, dry      Assessment & Plan:    See Problem List for Assessment and Plan of chronic medical problems.

## 2021-04-22 ENCOUNTER — Other Ambulatory Visit: Payer: Self-pay

## 2021-04-22 ENCOUNTER — Encounter: Payer: Self-pay | Admitting: Internal Medicine

## 2021-04-22 ENCOUNTER — Ambulatory Visit: Payer: Medicare PPO | Admitting: Internal Medicine

## 2021-04-22 DIAGNOSIS — H6690 Otitis media, unspecified, unspecified ear: Secondary | ICD-10-CM | POA: Insufficient documentation

## 2021-04-22 DIAGNOSIS — H6090 Unspecified otitis externa, unspecified ear: Secondary | ICD-10-CM | POA: Insufficient documentation

## 2021-04-22 DIAGNOSIS — S46812D Strain of other muscles, fascia and tendons at shoulder and upper arm level, left arm, subsequent encounter: Secondary | ICD-10-CM

## 2021-04-22 DIAGNOSIS — H60503 Unspecified acute noninfective otitis externa, bilateral: Secondary | ICD-10-CM | POA: Diagnosis not present

## 2021-04-22 DIAGNOSIS — S46812A Strain of other muscles, fascia and tendons at shoulder and upper arm level, left arm, initial encounter: Secondary | ICD-10-CM | POA: Insufficient documentation

## 2021-04-22 MED ORDER — ACETIC ACID 2 % OT SOLN: 4.0000 [drp] | OTIC | 0 refills | Status: DC

## 2021-04-22 MED ORDER — METHOCARBAMOL 500 MG PO TABS: 500.0000 mg | ORAL_TABLET | Freq: Every evening | ORAL | 5 refills | Status: DC | PRN

## 2021-04-22 MED ORDER — AZITHROMYCIN 250 MG PO TABS: ORAL_TABLET | ORAL | 0 refills | Status: DC

## 2021-04-22 NOTE — Assessment & Plan Note (Signed)
Acute Right TM slightly erythematous and with her symptoms that is concerning for otitis media Z-Pak today She will follow-up if her symptoms do not resolve

## 2021-04-22 NOTE — Patient Instructions (Addendum)
    Medications changes include :   zpak and ear drop for your ears.  Methocarbamol for your tight neck muscle.  Take at night - may cause drowsiness.      Your prescription(s) have been submitted to your pharmacy. Please take as directed and contact our office if you believe you are having problem(s) with the medication(s).

## 2021-04-22 NOTE — Assessment & Plan Note (Signed)
Chronic This has been going on over a year-significant pain and tightness in the left trapezius associated with decreased range of motion of neck Continue heat and ice Trial of methocarbamol 500 mg at bedtime Can consider physical therapy, but that would be difficult for her to manage at this time

## 2021-04-22 NOTE — Assessment & Plan Note (Signed)
Acute Bilateral ear canal irritation Concern for mild infection Acetic acid drops every 4 hours

## 2021-04-28 DIAGNOSIS — E063 Autoimmune thyroiditis: Secondary | ICD-10-CM | POA: Diagnosis not present

## 2021-04-28 DIAGNOSIS — E038 Other specified hypothyroidism: Secondary | ICD-10-CM | POA: Diagnosis not present

## 2021-05-05 ENCOUNTER — Encounter: Payer: Self-pay | Admitting: Internal Medicine

## 2021-05-05 ENCOUNTER — Ambulatory Visit: Payer: Medicare PPO | Admitting: Internal Medicine

## 2021-05-05 ENCOUNTER — Other Ambulatory Visit: Payer: Self-pay

## 2021-05-05 DIAGNOSIS — R110 Nausea: Secondary | ICD-10-CM

## 2021-05-05 DIAGNOSIS — M549 Dorsalgia, unspecified: Secondary | ICD-10-CM | POA: Diagnosis not present

## 2021-05-05 DIAGNOSIS — R319 Hematuria, unspecified: Secondary | ICD-10-CM

## 2021-05-05 DIAGNOSIS — R109 Unspecified abdominal pain: Secondary | ICD-10-CM

## 2021-05-05 DIAGNOSIS — R519 Headache, unspecified: Secondary | ICD-10-CM

## 2021-05-05 DIAGNOSIS — N3000 Acute cystitis without hematuria: Secondary | ICD-10-CM | POA: Insufficient documentation

## 2021-05-05 DIAGNOSIS — N3001 Acute cystitis with hematuria: Secondary | ICD-10-CM | POA: Diagnosis not present

## 2021-05-05 LAB — POC URINALSYSI DIPSTICK (AUTOMATED)
Bilirubin, UA: NEGATIVE
Blood, UA: 1
Glucose, UA: NEGATIVE
Ketones, UA: NEGATIVE
Leukocytes, UA: NEGATIVE
Nitrite, UA: NEGATIVE
Protein, UA: POSITIVE — AB
Spec Grav, UA: 1.02 (ref 1.010–1.025)
Urobilinogen, UA: 0.2 E.U./dL
pH, UA: 6 (ref 5.0–8.0)

## 2021-05-05 MED ORDER — NITROFURANTOIN MONOHYD MACRO 100 MG PO CAPS: 100.0000 mg | ORAL_CAPSULE | Freq: Two times a day (BID) | ORAL | 0 refills | Status: DC

## 2021-05-05 MED ORDER — ONDANSETRON HCL 4 MG PO TABS: 4.0000 mg | ORAL_TABLET | Freq: Three times a day (TID) | ORAL | 0 refills | Status: DC | PRN

## 2021-05-05 NOTE — Assessment & Plan Note (Signed)
Acute Symptoms consistent with probable UTI Over-the-counter home UTI test was positive, but urine dip here not overly convincing of UTI Will start treatment given symptoms and home positive UTI test Start nitrofurantoin 100 mg twice daily x7 days Zofran 4 mg every 8 hours as needed for nausea Discussed that if her symptoms worsen she needs to go to the emergency room Will send urine for culture Increased fluids

## 2021-05-05 NOTE — Patient Instructions (Signed)
Take the antibiotic as prescribed.  Take tylenol if needed.     Increase your water intake.   Call if no improvement     Urinary Tract Infection, Adult A urinary tract infection (UTI) is an infection of any part of the urinary tract, which includes the kidneys, ureters, bladder, and urethra. These organs make, store, and get rid of urine in the body. UTI can be a bladder infection (cystitis) or kidney infection (pyelonephritis). What are the causes? This infection may be caused by fungi, viruses, or bacteria. Bacteria are the most common cause of UTIs. This condition can also be caused by repeated incomplete emptying of the bladder during urination. What increases the risk? This condition is more likely to develop if:  You ignore your need to urinate or hold urine for long periods of time.  You do not empty your bladder completely during urination.  You wipe back to front after urinating or having a bowel movement, if you are female.  You are uncircumcised, if you are female.  You are constipated.  You have a urinary catheter that stays in place (indwelling).  You have a weak defense (immune) system.  You have a medical condition that affects your bowels, kidneys, or bladder.  You have diabetes.  You take antibiotic medicines frequently or for long periods of time, and the antibiotics no longer work well against certain types of infections (antibiotic resistance).  You take medicines that irritate your urinary tract.  You are exposed to chemicals that irritate your urinary tract.  You are female.  What are the signs or symptoms? Symptoms of this condition include:  Fever.  Frequent urination or passing small amounts of urine frequently.  Needing to urinate urgently.  Pain or burning with urination.  Urine that smells bad or unusual.  Cloudy urine.  Pain in the lower abdomen or back.  Trouble urinating.  Blood in the urine.  Vomiting or being less hungry than  normal.  Diarrhea or abdominal pain.  Vaginal discharge, if you are female.  How is this diagnosed? This condition is diagnosed with a medical history and physical exam. You will also need to provide a urine sample to test your urine. Other tests may be done, including:  Blood tests.  Sexually transmitted disease (STD) testing.  If you have had more than one UTI, a cystoscopy or imaging studies may be done to determine the cause of the infections. How is this treated? Treatment for this condition often includes a combination of two or more of the following:  Antibiotic medicine.  Other medicines to treat less common causes of UTI.  Over-the-counter medicines to treat pain.  Drinking enough water to stay hydrated.  Follow these instructions at home:  Take over-the-counter and prescription medicines only as told by your health care provider.  If you were prescribed an antibiotic, take it as told by your health care provider. Do not stop taking the antibiotic even if you start to feel better.  Avoid alcohol, caffeine, tea, and carbonated beverages. They can irritate your bladder.  Drink enough fluid to keep your urine clear or pale yellow.  Keep all follow-up visits as told by your health care provider. This is important.  Make sure to: ? Empty your bladder often and completely. Do not hold urine for long periods of time. ? Empty your bladder before and after sex. ? Wipe from front to back after a bowel movement if you are female. Use each tissue one time when you   wipe. Contact a health care provider if:  You have back pain.  You have a fever.  You feel nauseous or vomit.  Your symptoms do not get better after 3 days.  Your symptoms go away and then return. Get help right away if:  You have severe back pain or lower abdominal pain.  You are vomiting and cannot keep down any medicines or water. This information is not intended to replace advice given to you by  your health care provider. Make sure you discuss any questions you have with your health care provider. Document Released: 05/06/2005 Document Revised: 01/08/2016 Document Reviewed: 06/17/2015 Elsevier Interactive Patient Education  2018 [employer]   

## 2021-05-05 NOTE — Progress Notes (Signed)
Subjective:    Patient ID: [name], female    DOB: [birth year], 87 y.o.   MRN: [id number]  This visit occurred during the SARS-CoV-2 public health emergency.  Safety protocols were in place, including screening questions prior to the visit, additional usage of staff PPE, and extensive cleaning of exam room while observing appropriate contact time as indicated for disinfecting solutions.    HPI The patient is here for an acute visit.  She woke up this morning and started having urinary symptoms.  She has a strong history of urinary tract infections.  She states dark urine, blood in the urine, difficulty urinating, nausea, lower back pain and abdominal discomfort.  She denies any fevers or dysuria.  She has also had some headaches.  She did not do an over-the-counter UTI test and it did show many white blood cells and leukocytes.    Medications and allergies reviewed with patient and updated if appropriate.  Patient Active Problem List   Diagnosis Date Noted   Acute cystitis 05/05/2021   Otitis media 04/22/2021   Otitis externa 04/22/2021   Strain of left trapezius muscle 04/22/2021   Anxiety 12/31/2020   Left leg swelling 03/05/2020   B12 deficiency 12/15/2019   Tingling in extremities 10/04/2019   Chronic thoracic back pain 08/16/2019   Atrial fibrillation (HCC) 02/01/2018   Leg cramps 07/05/2017   Constipation 01/25/2017   Closed fracture of proximal end of right humerus 07/09/2016   Cough 06/17/2016   Abdominal pain 03/10/2016   Hearing loss 01/03/2016   Allergic rhinitis 01/03/2016   Minimal Coronary Plaque by Cardiac Cath in 2013 09/11/2015   Frequent loose stools 08/28/2015   Hyponatremia    Acute mesenteric ischemia (HCC)    Hypothyroidism 07/11/2012   Syncope 03/18/2012   Hypercholesteremia    Hypertension    Diabetes mellitus type 1 (HCC)    Osteoarthritis    Thyroid nodule    Osteoporosis, post-menopausal    Primary cancer of upper outer quadrant  of left female breast (HCC) 08/21/2011    Current Outpatient Medications on File Prior to Visit  Medication Sig Dispense Refill   acetic acid 2 % otic solution Place 4 drops into both ears every 3 (three) hours. 15 mL 0   ALPRAZolam (XANAX) 0.5 MG tablet TAKE 1/2 TABLET TWICE DAILY AS NEEDED FOR ANXIETY OR SLEEP 30 tablet 5   aspirin EC 81 MG tablet Take 81 mg by mouth daily.     azithromycin (ZITHROMAX) 250 MG tablet Take two tabs the first day and then one tab daily for four days 6 tablet 0   B Complex-C (SUPER B COMPLEX PO) Take 1 tablet by mouth daily.     BD VEO INSULIN SYRINGE U/F 31G X 15/64" 0.3 ML MISC USE WITH INJECTIONS 100 each 2   bisacodyl (DULCOLAX) 10 MG suppository Place 1 suppository (10 mg total) rectally as needed for moderate constipation. 12 suppository 0   carboxymethylcellulose (REFRESH PLUS) 0.5 % SOLN Place 1 drop into both eyes 3 (three) times daily as needed. Non-preservative     cholecalciferol (VITAMIN D) 1000 UNITS tablet Take 5,000 Units by mouth daily.      Continuous Blood Gluc Sensor (FREESTYLE LIBRE SENSOR SYSTEM) MISC by Does not apply route. Sensor is located in Right arm     DUREZOL 0.05 % EMUL Place 1 drop into the right eye 3 (three) times daily.     fish oil-omega-3 fatty acids 1000 MG capsule Take 2 g  by mouth daily.     glucose blood test strip OneTouch Ultra Blue Test Strip     hydrochlorothiazide (HYDRODIURIL) 25 MG tablet TAKE (1) TABLET DAILY AS NEEDED. 90 tablet 0   insulin glargine (LANTUS) 100 UNIT/ML Solostar Pen Inject 14 Units into the skin daily at 10 pm.      insulin lispro (HUMALOG) 100 UNIT/ML KiwkPen Inject into the skin. Sliding scale before meal     Insulin Pen Needle (BD PEN NEEDLE NANO 2ND GEN) 32G X 4 MM MISC INJECT AS DIRECTED FOUR TIMES A DAY     levothyroxine (SYNTHROID) 112 MCG tablet      losartan (COZAAR) 100 MG tablet TAKE 1 TABLET ONCE DAILY. 90 tablet 0   methocarbamol (ROBAXIN) 500 MG tablet Take 1 tablet (500 mg total)  by mouth at bedtime as needed for muscle spasms. 30 tablet 5   metoprolol succinate (TOPROL-XL) 25 MG 24 hr tablet TAKE 1/2 TABLET AS NEEDED FOR PALPITATIONS. 45 tablet 3   moxifloxacin (VIGAMOX) 0.5 % ophthalmic solution Place 1 drop into the right eye 4 (four) times daily.     Multiple Vitamin (MULTIVITAMIN) tablet Take 1 tablet by mouth daily. For breast and bone health     saccharomyces boulardii (FLORASTOR) 250 MG capsule Take 1 capsule (250 mg total) by mouth 2 (two) times daily. 60 capsule 5   simvastatin (ZOCOR) 20 MG tablet TAKE ONE TABLET AT BEDTIME. 90 tablet 1   No current facility-administered medications on file prior to visit.    Past Medical History:  Diagnosis Date   Anxiety    Arthritis    fingers   Bowel obstruction ([place])    Cancer of upper-outer quadrant of female breast ([place]) 08/21/2011   ER +  PR +  Her 2 -  Ki67 9%   0.9 cm invasive lobular  Carcinoma ,s/p central lumpectomy with sentinel node biopsy,  ER/PR positive s/p re-excion on 09/16/11 with final pathology showing atypical hyperplasia    CAP (community acquired pneumonia) 12/06/2016   Diabetes mellitus type 1 ([place])    type I- wears insulin pump   GERD (gastroesophageal reflux disease)    History of small bowel obstruction    Hypercholesteremia    Hypergastrinemia    Hypertension    Hypertension    Hyponatremia    Hypothyroid    Hypothyroidism    IBS (irritable bowel syndrome)    Osteoarthritis    Osteoporosis, post-menopausal    DEXA 12/02/11: -3.5 (with lomax gyn), declines bisphos due to bone pain   PAF (paroxysmal atrial fibrillation) ([place])    One episode in 2008, spontaneously converted in the ED, no further treatment   Spondylosis    thoracic spine   Thyroid nodule dx 07/2011   [place] q 4mo to follow calcified L thyroid nodule (1/73/56 [place])   Umbilical hernia     Past Surgical History:  Procedure Laterality Date   ABDOMINAL HYSTERECTOMY     ABDOMINAL HYSTERECTOMY  yrs ago   APPENDECTOMY      APPENDECTOMY  age 62   BLADDER REPAIR  5 yrs ago   BREAST LUMPECTOMY     BREAST SURGERY     biopsy   BREAST SURGERY   yrs ago   br bx   BREAST SURGERY  01/ 23/2013   left central lumpectomy and snbx, re-excision lumpectomy   CARDIOVASCULAR STRESS TEST  03/17/2012   colon surgery     Small intestines- took 6 inches out   COLONOSCOPY  07/11/2015  [facility] Dr Derrill Kay   ESOPHAGOGASTRODUODENOSCOPY  07/17/2010   [facility]   LEFT HEART CATHETERIZATION WITH CORONARY ANGIOGRAM N/A 03/18/2012   Procedure: LEFT HEART CATHETERIZATION WITH CORONARY ANGIOGRAM;  Surgeon: Minus Breeding, MD;  Location: [facility] CATH LAB;  Service: Cardiovascular;  Laterality: N/A;   TONSILLECTOMY   age 54    Social History   Socioeconomic History   Marital status: Married    Spouse name: Not on file   Number of children: Not on file   Years of education: Not on file   Highest education level: Not on file  Occupational History   Not on file  Tobacco Use   Smoking status: Former    Packs/day: 1.00    Years: 10.00    Pack years: 10.00    Types: Cigarettes    Quit date: 08/27/1977    Years since quitting: 43.7   Smokeless tobacco: Never  Vaping Use   Vaping Use: Never used  Substance and Sexual Activity   Alcohol use: Yes    Comment: rare   Drug use: No   Sexual activity: Yes    Birth control/protection: Post-menopausal  Other Topics Concern   Not on file  Social History Narrative   ** Merged History Encounter **       Social Determinants of Health   Financial Resource Strain: Not on file  Food Insecurity: Not on file  Transportation Needs: Not on file  Physical Activity: Not on file  Stress: Not on file  Social Connections: Not on file    Family History  Problem Relation Age of Onset   Cancer Sister        unknown   Hypertension Mother    Stroke Mother    Arthritis Father    Heart disease Father    Diabetes Son    Colon cancer Neg Hx    Esophageal cancer Neg Hx     Review of Systems   Constitutional:  Negative for fever.  Gastrointestinal:  Positive for abdominal pain and nausea.  Genitourinary:  Positive for difficulty urinating (few drops) and hematuria. Negative for dysuria.       Dark urine  Musculoskeletal:  Positive for back pain.  Neurological:  Positive for headaches.      Objective:  There were no vitals filed for this visit. BP Readings from Last 3 Encounters:  04/22/21 (!) 142/80  01/01/21 (!) 148/74  11/12/20 (!) 163/77   Wt Readings from Last 3 Encounters:  05/05/21 133 lb (60.3 kg)  04/22/21 131 lb (59.4 kg)  01/01/21 129 lb (58.5 kg)   Body mass index is 20.83 kg/m.   Physical Exam Constitutional:      General: She is not in acute distress.    Appearance: Normal appearance. She is not ill-appearing.  HENT:     Head: Normocephalic and atraumatic.  Abdominal:     General: There is no distension.     Tenderness: There is abdominal tenderness (Mild suprapubic tenderness). There is no right CVA tenderness, left CVA tenderness, guarding or rebound.  Musculoskeletal:     Right lower leg: No edema.     Left lower leg: No edema.  Skin:    General: Skin is warm and dry.  Neurological:     Mental Status: She is alert.           Assessment & Plan:    See Problem List for Assessment and Plan of chronic medical problems.

## 2021-05-07 ENCOUNTER — Telehealth: Payer: Self-pay | Admitting: Internal Medicine

## 2021-05-07 ENCOUNTER — Ambulatory Visit: Payer: Medicare PPO | Admitting: Internal Medicine

## 2021-05-07 LAB — CULTURE, URINE COMPREHENSIVE

## 2021-05-07 NOTE — Telephone Encounter (Signed)
   Patient calling to thank Dr Quay Burow and her [employer] for being so kind and caring.  She also states she is doing a lot better, some nausea taking the antibiotic. She states the nausea is tolerable taking the Zofran

## 2021-05-10 ENCOUNTER — Other Ambulatory Visit: Payer: Self-pay

## 2021-05-10 ENCOUNTER — Emergency Department (HOSPITAL_COMMUNITY)
Admission: EM | Admit: 2021-05-10 | Discharge: 2021-05-10 | Disposition: A | Discharge status: Home / Self Care | Payer: Medicare PPO | Attending: Emergency Medicine | Admitting: Emergency Medicine

## 2021-05-10 ENCOUNTER — Encounter (HOSPITAL_COMMUNITY): Payer: Self-pay | Admitting: Emergency Medicine

## 2021-05-10 ENCOUNTER — Emergency Department (HOSPITAL_COMMUNITY): Payer: Medicare PPO

## 2021-05-10 DIAGNOSIS — Z7982 Long term (current) use of aspirin: Secondary | ICD-10-CM | POA: Insufficient documentation

## 2021-05-10 DIAGNOSIS — Z87891 Personal history of nicotine dependence: Secondary | ICD-10-CM | POA: Insufficient documentation

## 2021-05-10 DIAGNOSIS — Z91040 Latex allergy status: Secondary | ICD-10-CM | POA: Diagnosis not present

## 2021-05-10 DIAGNOSIS — R300 Dysuria: Secondary | ICD-10-CM | POA: Insufficient documentation

## 2021-05-10 DIAGNOSIS — R350 Frequency of micturition: Secondary | ICD-10-CM | POA: Insufficient documentation

## 2021-05-10 DIAGNOSIS — Z794 Long term (current) use of insulin: Secondary | ICD-10-CM | POA: Diagnosis not present

## 2021-05-10 DIAGNOSIS — R1030 Lower abdominal pain, unspecified: Secondary | ICD-10-CM | POA: Insufficient documentation

## 2021-05-10 DIAGNOSIS — I10 Essential (primary) hypertension: Secondary | ICD-10-CM | POA: Insufficient documentation

## 2021-05-10 DIAGNOSIS — K449 Diaphragmatic hernia without obstruction or gangrene: Secondary | ICD-10-CM | POA: Diagnosis not present

## 2021-05-10 DIAGNOSIS — R319 Hematuria, unspecified: Secondary | ICD-10-CM | POA: Insufficient documentation

## 2021-05-10 DIAGNOSIS — E109 Type 1 diabetes mellitus without complications: Secondary | ICD-10-CM | POA: Insufficient documentation

## 2021-05-10 DIAGNOSIS — E039 Hypothyroidism, unspecified: Secondary | ICD-10-CM | POA: Insufficient documentation

## 2021-05-10 DIAGNOSIS — I700 Atherosclerosis of aorta: Secondary | ICD-10-CM | POA: Diagnosis not present

## 2021-05-10 DIAGNOSIS — Z79899 Other long term (current) drug therapy: Secondary | ICD-10-CM | POA: Diagnosis not present

## 2021-05-10 DIAGNOSIS — Z853 Personal history of malignant neoplasm of breast: Secondary | ICD-10-CM | POA: Insufficient documentation

## 2021-05-10 DIAGNOSIS — M47816 Spondylosis without myelopathy or radiculopathy, lumbar region: Secondary | ICD-10-CM | POA: Diagnosis not present

## 2021-05-10 DIAGNOSIS — N1330 Unspecified hydronephrosis: Secondary | ICD-10-CM | POA: Diagnosis not present

## 2021-05-10 LAB — CBC
HCT: 38.8 % (ref 36.0–46.0)
Hemoglobin: 12.8 g/dL (ref 12.0–15.0)
MCH: 29.8 pg (ref 26.0–34.0)
MCHC: 33 g/dL (ref 30.0–36.0)
MCV: 90.2 fL (ref 80.0–100.0)
Platelets: 247 10*3/uL (ref 150–400)
RBC: 4.3 MIL/uL (ref 3.87–5.11)
RDW: 12.3 % (ref 11.5–15.5)
WBC: 6.5 10*3/uL (ref 4.0–10.5)
nRBC: 0 % (ref 0.0–0.2)

## 2021-05-10 LAB — COMPREHENSIVE METABOLIC PANEL
ALT: 18 U/L (ref 0–44)
AST: 31 U/L (ref 15–41)
Albumin: 4 g/dL (ref 3.5–5.0)
Alkaline Phosphatase: 69 U/L (ref 38–126)
Anion gap: 12 (ref 5–15)
BUN: 10 mg/dL (ref 8–23)
CO2: 22 mmol/L (ref 22–32)
Calcium: 10 mg/dL (ref 8.9–10.3)
Chloride: 95 mmol/L — ABNORMAL LOW (ref 98–111)
Creatinine, Ser: 0.74 mg/dL (ref 0.44–1.00)
GFR, Estimated: >60 mL/min (ref >60)
Glucose, Bld: 167 mg/dL — ABNORMAL HIGH (ref 70–99)
Potassium: 3.7 mmol/L (ref 3.5–5.1)
Sodium: 129 mmol/L — ABNORMAL LOW (ref 135–145)
Total Bilirubin: 0.6 mg/dL (ref 0.3–1.2)
Total Protein: 7.5 g/dL (ref 6.5–8.1)

## 2021-05-10 LAB — URINALYSIS, ROUTINE W REFLEX MICROSCOPIC
Bilirubin Urine: NEGATIVE
Glucose, UA: NEGATIVE mg/dL
Ketones, ur: NEGATIVE mg/dL
Leukocytes,Ua: NEGATIVE
Nitrite: NEGATIVE
Protein, ur: 30 mg/dL — AB
Specific Gravity, Urine: 1.01 (ref 1.005–1.030)
pH: 6 (ref 5.0–8.0)

## 2021-05-10 LAB — LIPASE, BLOOD: Lipase: 20 U/L (ref 11–51)

## 2021-05-10 NOTE — ED Notes (Signed)
Culture sent down

## 2021-05-10 NOTE — ED Notes (Signed)
E-signature pad unavailable at time of pt discharge. This RN discussed discharge materials with pt and answered all pt questions. Pt stated understanding of discharge material. ? ?

## 2021-05-10 NOTE — Discharge Instructions (Signed)
The kidney results look normal today.  The urine shows blood in your urine but does not show a significant sign of infection.  Your white blood cell count is normal.  The CAT scan does not show any sign of kidney stones or problems with your bowel.  The diarrhea and nausea may be related to the antibiotic you are on.  At this time stop taking the antibiotic.  You had a new culture done and lets wait and see for a few days if any bacteria grows out.  You will need to follow-up with the urologist about this blood in your urine but call Dr. Quay Burow office on Monday to check in with her and to make sure you are feeling okay.  If between now and then you start having fevers, persistent vomiting, severe pain or other concerns like passing out, shortness of breath or feeling weak and unable to get out of bed you should return to the emergency room.

## 2021-05-10 NOTE — ED Provider Notes (Signed)
[facility] EMERGENCY DEPARTMENT Provider Note   CSN: [id number] Arrival date & time: 05/10/21  1616     History Chief Complaint  Patient presents with   Hematuria    [name] is a 87 y.o. female.  Patient is an 87 year old female with a history of hypothyroidism, hyponatremia, hypertension, diabetes type 1 on insulin pump, paroxysmal atrial fibrillation not on anticoagulation who is presenting today with persistent dysuria, frequency, lower abdominal pain and flank pain bilaterally.  Patient reports last Sunday she started having symptoms and saw her PCP Dr. Quay Burow on Monday because she was noticing her urine was very dark.  At that time she had a urine dip in the office that was concerning for infection and she was started on Macrobid.  She has been taking that regularly and reports the next day she was feeling better her urine had improved and for 2 days she felt better on the Macrobid but then her symptoms started to return.  Now for the last 3 days she has had ongoing symptoms and they have been gradually worsening.  Based on the culture done at her doctor's office she grew multiple flora but no specific bacteria but patient has continued to take the [place].  Since starting the [place] she has also had some diarrhea, decreased appetite and nausea but denies any vomiting.  No fevers.  She denies any cough or shortness of breath.  The history is provided by the patient and medical records.  Hematuria      Past Medical History:  Diagnosis Date   Anxiety    Arthritis    fingers   Bowel obstruction ([place])    Cancer of upper-outer quadrant of female breast ([place]) 08/21/2011   ER +  PR +  Her 2 -  Ki67 9%   0.9 cm invasive lobular  Carcinoma ,s/p central lumpectomy with sentinel node biopsy,  ER/PR positive s/p re-excion on 09/16/11 with final pathology showing atypical hyperplasia    CAP (community acquired pneumonia) 12/06/2016   Diabetes mellitus type 1 ([place])     type I- wears insulin pump   GERD (gastroesophageal reflux disease)    History of small bowel obstruction    Hypercholesteremia    Hypergastrinemia    Hypertension    Hypertension    Hyponatremia    Hypothyroid    Hypothyroidism    IBS (irritable bowel syndrome)    Osteoarthritis    Osteoporosis, post-menopausal    DEXA 12/02/11: -3.5 (with lomax gyn), declines bisphos due to bone pain   PAF (paroxysmal atrial fibrillation) ([place])    One episode in 2008, spontaneously converted in the ED, no further treatment   Spondylosis    thoracic spine   Thyroid nodule dx 07/2011   [place] q 31moto follow calcified L thyroid nodule (49/37/[place]   Umbilical hernia     Patient Active Problem List   Diagnosis Date Noted   Acute cystitis 05/05/2021   Otitis media 04/22/2021   Otitis externa 04/22/2021   Strain of left trapezius muscle 04/22/2021   Anxiety 12/31/2020   Left leg swelling 03/05/2020   B12 deficiency 12/15/2019   Tingling in extremities 10/04/2019   Chronic thoracic back pain 08/16/2019   Atrial fibrillation ([place] 02/01/2018   Leg cramps 07/05/2017   Constipation 01/25/2017   Closed fracture of proximal end of right humerus 07/09/2016   Cough 06/17/2016   Abdominal pain 03/10/2016   Hearing loss 01/03/2016   Allergic rhinitis 01/03/2016  Minimal Coronary Plaque by Cardiac Cath in 2013 09/11/2015   Frequent loose stools 08/28/2015   Hyponatremia    Acute mesenteric ischemia ([place])    Hypothyroidism 07/11/2012   Syncope 03/18/2012   Hypercholesteremia    Hypertension    Diabetes mellitus type 1 ([place])    Osteoarthritis    Thyroid nodule    Osteoporosis, post-menopausal    Primary cancer of upper outer quadrant of left female breast ([place]) 08/21/2011    Past Surgical History:  Procedure Laterality Date   ABDOMINAL HYSTERECTOMY     ABDOMINAL HYSTERECTOMY  yrs ago   APPENDECTOMY     APPENDECTOMY  age 80   BLADDER REPAIR  5 yrs ago   BREAST LUMPECTOMY     BREAST  SURGERY     biopsy   BREAST SURGERY   yrs ago   br bx   BREAST SURGERY  01/ 23/2013   left central lumpectomy and snbx, re-excision lumpectomy   CARDIOVASCULAR STRESS TEST  03/17/2012   colon surgery     Small intestines- took 6 inches out   COLONOSCOPY  07/11/2015   [facility] Dr Derrill Kay   ESOPHAGOGASTRODUODENOSCOPY  07/17/2010   [facility]   LEFT HEART CATHETERIZATION WITH CORONARY ANGIOGRAM N/A 03/18/2012   Procedure: LEFT HEART CATHETERIZATION WITH CORONARY ANGIOGRAM;  Surgeon: Minus Breeding, MD;  [facility] CATH LAB;  Service: Cardiovascular;  Laterality: N/A;   TONSILLECTOMY   age 71     OB History   No obstetric history on file.     Family History  Problem Relation Age of Onset   Cancer Sister        unknown   Hypertension Mother    Stroke Mother    Arthritis Father    Heart disease Father    Diabetes Son    Colon cancer Neg Hx    Esophageal cancer Neg Hx     Social History   Tobacco Use   Smoking status: Former    Packs/day: 1.00    Years: 10.00    Pack years: 10.00    Types: Cigarettes    Quit date: 08/27/1977    Years since quitting: 43.7   Smokeless tobacco: Never  Vaping Use   Vaping Use: Never used  Substance Use Topics   Alcohol use: Yes    Comment: rare   Drug use: No    Home Medications Prior to Admission medications   Medication Sig Start Date End Date Taking? Authorizing Provider  acetic acid 2 % otic solution Place 4 drops into both ears every 3 (three) hours. 04/22/21   Binnie Rail, MD  ALPRAZolam Duanne Moron) 0.5 MG tablet TAKE 1/2 TABLET TWICE DAILY AS NEEDED FOR ANXIETY OR SLEEP 04/15/21   Binnie Rail, MD  aspirin EC 81 MG tablet Take 81 mg by mouth daily.    [provider]  azithromycin (ZITHROMAX) 250 MG tablet Take two tabs the first day and then one tab daily for four days 04/22/21   Binnie Rail, MD  B Complex-C (SUPER B COMPLEX PO) Take 1 tablet by mouth daily.    [provider]  BD VEO INSULIN SYRINGE U/F  31G X 15/64" 0.3 ML MISC USE WITH INJECTIONS 10/15/20   Binnie Rail, MD  bisacodyl (DULCOLAX) 10 MG suppository Place 1 suppository (10 mg total) rectally as needed for moderate constipation. 04/27/19   Binnie Rail, MD  carboxymethylcellulose (REFRESH PLUS) 0.5 % SOLN Place 1 drop into both eyes 3 (three)  times daily as needed. Non-preservative    [provider]  cholecalciferol (VITAMIN D) 1000 UNITS tablet Take 5,000 Units by mouth daily.     [provider]  Continuous Blood Gluc Sensor ([place]) MISC by Does not apply route. Sensor is located in Right arm    [provider]  DUREZOL 0.05 % EMUL Place 1 drop into the right eye 3 (three) times daily. 04/01/21   [provider]  fish oil-omega-3 fatty acids 1000 MG capsule Take 2 g by mouth daily.    [provider]  glucose blood test strip OneTouch Ultra Blue Test Strip    [provider]  hydrochlorothiazide (HYDRODIURIL) 25 MG tablet TAKE (1) TABLET DAILY AS NEEDED. 04/11/20   Binnie Rail, MD  insulin glargine (LANTUS) 100 UNIT/ML Solostar Pen Inject 14 Units into the skin daily at 10 pm.  07/29/14   [provider]  insulin lispro (HUMALOG) 100 UNIT/ML KiwkPen Inject into the skin. Sliding scale before meal    [provider]  Insulin Pen Needle (BD PEN NEEDLE NANO 2ND GEN) 32G X 4 MM MISC INJECT AS DIRECTED FOUR TIMES A DAY 08/01/19   [provider]  levothyroxine (SYNTHROID) 112 MCG tablet  03/22/20   [provider]  losartan (COZAAR) 100 MG tablet TAKE 1 TABLET ONCE DAILY. 03/20/21   Binnie Rail, MD  methocarbamol (ROBAXIN) 500 MG tablet Take 1 tablet (500 mg total) by mouth at bedtime as needed for muscle spasms. 04/22/21   Binnie Rail, MD  metoprolol succinate (TOPROL-XL) 25 MG 24 hr tablet TAKE 1/2 TABLET AS NEEDED FOR PALPITATIONS. 02/21/21   Evans Lance, MD  moxifloxacin (VIGAMOX) 0.5 % ophthalmic solution Place 1  drop into the right eye 4 (four) times daily. 04/01/21   [provider]  Multiple Vitamin (MULTIVITAMIN) tablet Take 1 tablet by mouth daily. For breast and bone health    [provider]  nitrofurantoin, macrocrystal-monohydrate, (MACROBID) 100 MG capsule Take 1 capsule (100 mg total) by mouth 2 (two) times daily. 05/05/21   Binnie Rail, MD  ondansetron (ZOFRAN) 4 MG tablet Take 1 tablet (4 mg total) by mouth every 8 (eight) hours as needed for nausea or vomiting. 05/05/21   Binnie Rail, MD  saccharomyces boulardii (FLORASTOR) 250 MG capsule Take 1 capsule (250 mg total) by mouth 2 (two) times daily. 04/04/19   Binnie Rail, MD  simvastatin (ZOCOR) 20 MG tablet TAKE ONE TABLET AT BEDTIME. 11/27/20   Binnie Rail, MD    Allergies    Eliquis [apixaban], Amlodipine, Augmentin [amoxicillin-pot clavulanate], Cortisone, Keflex [cephalexin], Omeprazole, Prednisone, Hydrocodone, and Latex  Review of Systems   Review of Systems  Genitourinary:  Positive for hematuria.  All other systems reviewed and are negative.  Physical Exam Updated Vital Signs BP (!) 156/72   Pulse 74   Temp 98.4 F (36.9 C) (Oral)   Resp 15   Ht _0  (1.702 m)   Wt 59 kg   SpO2 98%   BMI 20.36 kg/m   Physical Exam Vitals and nursing note reviewed.  Constitutional:      General: She is not in acute distress.    Appearance: Normal appearance. She is well-developed.  HENT:     Head: Normocephalic and atraumatic.  Eyes:     Pupils: Pupils are equal, round, and reactive to light.  Cardiovascular:     Rate and Rhythm: Normal rate and regular rhythm.  Heart sounds: Normal heart sounds. No murmur heard.   No friction rub.  Pulmonary:     Effort: Pulmonary effort is normal.     Breath sounds: Normal breath sounds. No wheezing or rales.  Abdominal:     General: Bowel sounds are normal. There is no distension.     Palpations: Abdomen is soft.     Tenderness: There is abdominal tenderness  in the suprapubic area. There is right CVA tenderness and left CVA tenderness. There is no guarding or rebound.  Musculoskeletal:        General: No tenderness. Normal range of motion.     Right lower leg: No edema.     Left lower leg: No edema.     Comments: No edema  Skin:    General: Skin is warm and dry.     Findings: No rash.  Neurological:     General: No focal deficit present.     Mental Status: She is alert and oriented to person, place, and time. Mental status is at baseline.     Cranial Nerves: No cranial nerve deficit.  Psychiatric:        Mood and Affect: Mood normal.        Behavior: Behavior normal.    ED Results / Procedures / Treatments   Labs (all labs ordered are listed, but only abnormal results are displayed) Labs Reviewed  COMPREHENSIVE METABOLIC PANEL - Abnormal; Notable for the following components:      Result Value   Sodium 129 (*)    Chloride 95 (*)    Glucose, Bld 167 (*)    All other components within normal limits  URINALYSIS, ROUTINE W REFLEX MICROSCOPIC - Abnormal; Notable for the following components:   APPearance HAZY (*)    Hgb urine dipstick MODERATE (*)    Protein, ur 30 (*)    Bacteria, UA RARE (*)    All other components within normal limits  URINE CULTURE  LIPASE, BLOOD  CBC    EKG None  Radiology CT Renal Stone Study  Result Date: 05/10/2021 CLINICAL DATA:  Being treated for urinary tract infection, presenting with hematuria. EXAM: CT ABDOMEN AND PELVIS WITHOUT CONTRAST TECHNIQUE: Multidetector CT imaging of the abdomen and pelvis was performed following the standard protocol without IV contrast. COMPARISON:  July 26, 2020 FINDINGS: Lower chest: No acute abnormality. Hepatobiliary: No focal liver abnormality is seen. No gallstones, gallbladder wall thickening, or biliary dilatation. Pancreas: Unremarkable. No pancreatic ductal dilatation or surrounding inflammatory changes. Spleen: Normal in size without focal abnormality.  Adrenals/Urinary Tract: Adrenal glands are unremarkable. Kidneys are normal in size, without renal calculi or focal lesions. There is mild bilateral hydronephrosis and hydroureter, right slightly greater than left. Bladder is unremarkable. Stomach/Bowel: There is a small hiatal hernia. Appendix appears normal. Surgically anastomosed bowel is seen within the mid abdomen, along the midline. No evidence of bowel wall thickening, distention, or inflammatory changes. Vascular/Lymphatic: Aortic atherosclerosis. No enlarged abdominal or pelvic lymph nodes. Reproductive: Status post hysterectomy. No adnexal masses. Other: No abdominal wall hernia or abnormality. No abdominopelvic ascites. Musculoskeletal: Multilevel degenerative changes seen throughout the lumbar spine. IMPRESSION: 1. Mild bilateral hydronephrosis and hydroureter, without obstructing renal calculi. 2. Small hiatal hernia. 3. Aortic atherosclerosis. Aortic Atherosclerosis (ICD10-I70.0). Electronically Signed   By: Virgina Norfolk M.D.   On: 05/10/2021 20:45    Procedures Procedures   Medications Ordered in ED Medications - No data to display  ED Course  I have reviewed the triage vital signs  and the nursing notes.  Pertinent labs & imaging results that were available during my care of the patient were reviewed by me and considered in my medical decision making (see chart for details).    MDM Rules/Calculators/A&P                           Elderly female with type 1 diabetes presenting today with ongoing urinary complaints.  She is having pain in her suprapubic area but bilateral flank pain as well which has worsened over the last 3 to 4 days.  Started on Macrobid on Monday of this week for dark appearing urine with some protein and dysuria.  Culture done in the office on 05/05/2021 was negative for evidence of specific infection and grew out multiple flora.  Patient reports she is just not getting any better and they recommended she come  to the emergency room for evaluation.  She has had some nausea but no vomiting.  No evidence of DKA today.  CMP with mild hyponatremia of 129, blood sugar of 160 with normal creatinine and anion gap of 12, CBC within normal limits, lipase within normal limits, UA with moderate hemoglobin negative nitrites leukocytes with 21-50 red cells and 11-20 white blood cells with rare bacteria.  Concern for possible pyelonephritis given ongoing pain versus kidney stone.  We will do a CT to further evaluate.  Other than hypertension with a blood pressure of 181/61 patient's vital signs are normal.  She was given Zofran in triage with significant improvement in her nausea.  9:30 PM Patient's CT shows mild bilateral hydronephrosis and hydroureter without obstructing renal calculi at this time.  No other acute findings.  Given patient's urine is not classic for infection with recent culture that grew out multiple species we will repeat urine culture.  At this time we will not restart any new antibiotics as patient is not having significant infectious symptoms.  Feel that some of her symptoms may be a result of being on the antibiotic.  She has close follow-up with her PCP and will need follow-up with urology for the hematuria.  Patient given return precautions.  She and her husband are comfortable with this plan.  MDM   Amount and/or Complexity of Data Reviewed Clinical lab tests: reviewed and ordered Tests in the radiology section of CPT: ordered and reviewed Tests in the medicine section of CPT: ordered and reviewed Independent visualization of images, tracings, or specimens: yes  Patient Progress Patient progress: stable   Final Clinical Impression(s) / ED Diagnoses Final diagnoses:  Hematuria, unspecified type    Rx / DC Orders ED Discharge Orders     None        Blanchie Dessert, MD 05/10/21 2133

## 2021-05-10 NOTE — ED Triage Notes (Signed)
Pt currently being treated for a UTI, c/o blood in her urine, low abdominal and low back pain.

## 2021-05-11 LAB — URINE CULTURE: Culture: NO GROWTH

## 2021-05-12 ENCOUNTER — Telehealth: Payer: Self-pay | Admitting: Internal Medicine

## 2021-05-12 DIAGNOSIS — N1330 Unspecified hydronephrosis: Secondary | ICD-10-CM

## 2021-05-12 DIAGNOSIS — R310 Gross hematuria: Secondary | ICD-10-CM

## 2021-05-12 NOTE — Telephone Encounter (Signed)
She went to the ED 10/1.  I believe she was supposed to follow-up with urology-please make sure she plans to follow-up with them.

## 2021-05-12 NOTE — Telephone Encounter (Signed)
Message left for patient today 

## 2021-05-12 NOTE — Telephone Encounter (Signed)
Patient calling in about still having blood in urine.. had recent visit w/ provider & then went to ED this past weekend 05/10/21  Wants provider or nurse to give her a call when possible  [phone] (h) [phone] (c)

## 2021-05-12 NOTE — Telephone Encounter (Signed)
[facility] FYI 05/10/21...   Caller is on nitrofurantoin Mono-MCR 100mg  for a urinary infection. She has taken 3 days of the medication. She has blood and proteins in her urine and wants to know what to do. She has 1 day of medication left. states she is using multi sticks to check urine. states she feels more pressure on bladder, and nausea. Hx of DM type 1, hx of hypothyroid, temp reading 98.6 temporal.   Advised to contact PCP or [facility] within 4 hrs. Patient preferred to go to [facility] instead.

## 2021-05-13 NOTE — Telephone Encounter (Signed)
Message was left for patient on yesterday from previous message.

## 2021-05-13 NOTE — Addendum Note (Signed)
Addended by: Binnie Rail on: 05/13/2021 04:41 PM   Modules accepted: Orders

## 2021-05-13 NOTE — Telephone Encounter (Signed)
Referral ordered

## 2021-05-13 NOTE — Telephone Encounter (Signed)
Patient states she was instructed to follow up with a urologist but she does not have one. Requesting a referral to a urologist.

## 2021-05-15 DIAGNOSIS — E1065 Type 1 diabetes mellitus with hyperglycemia: Secondary | ICD-10-CM | POA: Diagnosis not present

## 2021-05-15 DIAGNOSIS — R3121 Asymptomatic microscopic hematuria: Secondary | ICD-10-CM | POA: Diagnosis not present

## 2021-05-15 DIAGNOSIS — R8279 Other abnormal findings on microbiological examination of urine: Secondary | ICD-10-CM | POA: Diagnosis not present

## 2021-06-06 DIAGNOSIS — R3121 Asymptomatic microscopic hematuria: Secondary | ICD-10-CM | POA: Diagnosis not present

## 2021-06-16 DIAGNOSIS — R3915 Urgency of urination: Secondary | ICD-10-CM | POA: Diagnosis not present

## 2021-06-16 DIAGNOSIS — R8279 Other abnormal findings on microbiological examination of urine: Secondary | ICD-10-CM | POA: Diagnosis not present

## 2021-06-16 DIAGNOSIS — N3000 Acute cystitis without hematuria: Secondary | ICD-10-CM | POA: Diagnosis not present

## 2021-06-23 ENCOUNTER — Encounter (HOSPITAL_BASED_OUTPATIENT_CLINIC_OR_DEPARTMENT_OTHER): Payer: Self-pay | Admitting: Urology

## 2021-06-23 ENCOUNTER — Telehealth: Payer: Self-pay | Admitting: Internal Medicine

## 2021-06-23 ENCOUNTER — Other Ambulatory Visit: Payer: Self-pay

## 2021-06-23 ENCOUNTER — Emergency Department (HOSPITAL_BASED_OUTPATIENT_CLINIC_OR_DEPARTMENT_OTHER)
Admission: EM | Admit: 2021-06-23 | Discharge: 2021-06-23 | Disposition: A | Discharge status: Home / Self Care | Payer: Medicare PPO | Attending: Emergency Medicine | Admitting: Emergency Medicine

## 2021-06-23 ENCOUNTER — Emergency Department (HOSPITAL_BASED_OUTPATIENT_CLINIC_OR_DEPARTMENT_OTHER): Payer: Medicare PPO

## 2021-06-23 DIAGNOSIS — N2889 Other specified disorders of kidney and ureter: Secondary | ICD-10-CM | POA: Insufficient documentation

## 2021-06-23 DIAGNOSIS — Z79899 Other long term (current) drug therapy: Secondary | ICD-10-CM | POA: Diagnosis not present

## 2021-06-23 DIAGNOSIS — Z7982 Long term (current) use of aspirin: Secondary | ICD-10-CM | POA: Diagnosis not present

## 2021-06-23 DIAGNOSIS — Z794 Long term (current) use of insulin: Secondary | ICD-10-CM | POA: Insufficient documentation

## 2021-06-23 DIAGNOSIS — Z87891 Personal history of nicotine dependence: Secondary | ICD-10-CM | POA: Diagnosis not present

## 2021-06-23 DIAGNOSIS — K625 Hemorrhage of anus and rectum: Secondary | ICD-10-CM | POA: Diagnosis not present

## 2021-06-23 DIAGNOSIS — Z853 Personal history of malignant neoplasm of breast: Secondary | ICD-10-CM | POA: Diagnosis not present

## 2021-06-23 DIAGNOSIS — K567 Ileus, unspecified: Secondary | ICD-10-CM | POA: Diagnosis not present

## 2021-06-23 DIAGNOSIS — N3289 Other specified disorders of bladder: Secondary | ICD-10-CM | POA: Diagnosis not present

## 2021-06-23 DIAGNOSIS — M545 Low back pain, unspecified: Secondary | ICD-10-CM | POA: Diagnosis not present

## 2021-06-23 DIAGNOSIS — K6389 Other specified diseases of intestine: Secondary | ICD-10-CM | POA: Diagnosis not present

## 2021-06-23 DIAGNOSIS — I10 Essential (primary) hypertension: Secondary | ICD-10-CM | POA: Diagnosis not present

## 2021-06-23 DIAGNOSIS — E039 Hypothyroidism, unspecified: Secondary | ICD-10-CM | POA: Diagnosis not present

## 2021-06-23 DIAGNOSIS — R339 Retention of urine, unspecified: Secondary | ICD-10-CM | POA: Insufficient documentation

## 2021-06-23 DIAGNOSIS — E109 Type 1 diabetes mellitus without complications: Secondary | ICD-10-CM | POA: Insufficient documentation

## 2021-06-23 DIAGNOSIS — Z91040 Latex allergy status: Secondary | ICD-10-CM | POA: Insufficient documentation

## 2021-06-23 DIAGNOSIS — I700 Atherosclerosis of aorta: Secondary | ICD-10-CM | POA: Diagnosis not present

## 2021-06-23 DIAGNOSIS — R102 Pelvic and perineal pain: Secondary | ICD-10-CM | POA: Diagnosis present

## 2021-06-23 DIAGNOSIS — E871 Hypo-osmolality and hyponatremia: Secondary | ICD-10-CM | POA: Insufficient documentation

## 2021-06-23 DIAGNOSIS — K5641 Fecal impaction: Secondary | ICD-10-CM | POA: Diagnosis not present

## 2021-06-23 LAB — CBC WITH DIFFERENTIAL/PLATELET
Abs Immature Granulocytes: 0.03 10*3/uL (ref 0.00–0.07)
Basophils Absolute: 0 10*3/uL (ref 0.0–0.1)
Basophils Relative: 0 %
Eosinophils Absolute: 0 10*3/uL (ref 0.0–0.5)
Eosinophils Relative: 0 %
HCT: 37.3 % (ref 36.0–46.0)
Hemoglobin: 13.1 g/dL (ref 12.0–15.0)
Immature Granulocytes: 0 %
Lymphocytes Relative: 21 %
Lymphs Abs: 1.4 10*3/uL (ref 0.7–4.0)
MCH: 30.7 pg (ref 26.0–34.0)
MCHC: 35.1 g/dL (ref 30.0–36.0)
MCV: 87.4 fL (ref 80.0–100.0)
Monocytes Absolute: 0.9 10*3/uL (ref 0.1–1.0)
Monocytes Relative: 14 %
Neutro Abs: 4.3 10*3/uL (ref 1.7–7.7)
Neutrophils Relative %: 65 %
Platelets: 243 10*3/uL (ref 150–400)
RBC: 4.27 MIL/uL (ref 3.87–5.11)
RDW: 12.5 % (ref 11.5–15.5)
WBC: 6.7 10*3/uL (ref 4.0–10.5)
nRBC: 0 % (ref 0.0–0.2)

## 2021-06-23 LAB — URINALYSIS, MICROSCOPIC (REFLEX): WBC, UA: NONE SEEN WBC/hpf (ref 0–5)

## 2021-06-23 LAB — URINALYSIS, ROUTINE W REFLEX MICROSCOPIC
Bilirubin Urine: NEGATIVE
Glucose, UA: 250 mg/dL — AB
Ketones, ur: NEGATIVE mg/dL
Leukocytes,Ua: NEGATIVE
Nitrite: NEGATIVE
Protein, ur: NEGATIVE mg/dL
Specific Gravity, Urine: 1.01 (ref 1.005–1.030)
pH: 6 (ref 5.0–8.0)

## 2021-06-23 LAB — COMPREHENSIVE METABOLIC PANEL
ALT: 14 U/L (ref 0–44)
AST: 25 U/L (ref 15–41)
Albumin: 4.1 g/dL (ref 3.5–5.0)
Alkaline Phosphatase: 72 U/L (ref 38–126)
Anion gap: 10 (ref 5–15)
BUN: 13 mg/dL (ref 8–23)
CO2: 25 mmol/L (ref 22–32)
Calcium: 9.9 mg/dL (ref 8.9–10.3)
Chloride: 94 mmol/L — ABNORMAL LOW (ref 98–111)
Creatinine, Ser: 0.81 mg/dL (ref 0.44–1.00)
GFR, Estimated: >60 mL/min (ref >60)
Glucose, Bld: 249 mg/dL — ABNORMAL HIGH (ref 70–99)
Potassium: 3.2 mmol/L — ABNORMAL LOW (ref 3.5–5.1)
Sodium: 129 mmol/L — ABNORMAL LOW (ref 135–145)
Total Bilirubin: 0.9 mg/dL (ref 0.3–1.2)
Total Protein: 8.1 g/dL (ref 6.5–8.1)

## 2021-06-23 LAB — TROPONIN I (HIGH SENSITIVITY): Troponin I (High Sensitivity): 4 ng/L (ref <18)

## 2021-06-23 LAB — OCCULT BLOOD X 1 CARD TO LAB, STOOL: Fecal Occult Bld: POSITIVE — AB

## 2021-06-23 LAB — LIPASE, BLOOD: Lipase: 20 U/L (ref 11–51)

## 2021-06-23 MED ORDER — METOCLOPRAMIDE HCL 5 MG/ML IJ SOLN
5.0000 mg | Freq: Once | INTRAMUSCULAR | Status: AC
  Administered 2021-06-23: 5 mg via INTRAVENOUS
  Filled 2021-06-23: qty 2

## 2021-06-23 MED ORDER — METOCLOPRAMIDE HCL 10 MG PO TABS: 10.0000 mg | ORAL_TABLET | Freq: Four times a day (QID) | ORAL | 0 refills | Status: DC

## 2021-06-23 MED ORDER — MAGNESIUM OXIDE -MG SUPPLEMENT 400 (240 MG) MG PO TABS
800.0000 mg | ORAL_TABLET | Freq: Once | ORAL | Status: AC
Start: 2021-06-23 — End: 2021-06-23
  Administered 2021-06-23: 800 mg via ORAL
  Filled 2021-06-23: qty 2

## 2021-06-23 MED ORDER — SODIUM CHLORIDE 0.9 % IV BOLUS
1000.0000 mL | Freq: Once | INTRAVENOUS | Status: AC
  Administered 2021-06-23: 1000 mL via INTRAVENOUS

## 2021-06-23 MED ORDER — ACETAMINOPHEN 325 MG PO TABS
650.0000 mg | ORAL_TABLET | Freq: Once | ORAL | Status: AC
  Administered 2021-06-23: 650 mg via ORAL
  Filled 2021-06-23: qty 2

## 2021-06-23 MED ORDER — POTASSIUM CHLORIDE CRYS ER 20 MEQ PO TBCR
40.0000 meq | EXTENDED_RELEASE_TABLET | Freq: Once | ORAL | Status: AC
  Administered 2021-06-23: 40 meq via ORAL
  Filled 2021-06-23: qty 2

## 2021-06-23 NOTE — Telephone Encounter (Signed)
Patient is having severe pain in lower back,flank, and legs. Stated she contacted [facility] over the weekend. They had her to do some labs but they didn't show anything that would suggest it being related to her kidneys. Stated the pain is 10/10. She was using a walker to assist moving around the house.   Transferred call to triage nurse.

## 2021-06-23 NOTE — Telephone Encounter (Signed)
Noted  

## 2021-06-23 NOTE — Discharge Instructions (Addendum)
Please return to the emergency department if you develop vomiting or worsening symptoms, this may be a sign that you have a complete bowel obstruction.   We found a couple of findings on your CT imaging - Ileus of your bowel:  I have prescribed you an medication called Reglan to help improve the motility of your bowels. Increase fluid intake at home. If you develop worsening symptoms such as persistent vomiting or worsening abdominal pain, please return to the ED to be evaluated for a bowel obstruction. - There is a possible mass of the right kidney. You need to have this further evaluated outpatient. I have ordered an outpatient MRI of your abdomen to further evaluate this. You need to set up an appointment to have this MRI completed. Please call the phone number provided on your discharge paperwork to set up this appointment.  We also found that you have positive blood in your stool. Your hemoglobin is normal so we do not think that you have a concerning GI bleed, but you should follow up with your PCP regarding this.   You were also found to have urinary retention.  We have placed a foley catheter. Please call the Urologist, Dr. Lovena Neighbours. His contact information is included in your paperwork. You will need to be reevaluated in a couple of days to determine if this can be removed.   Please schedule an appointment with your PCP as soon as you can be seen for an ED follow up. They may need to refer you to a urologist for the kidney mass or gastroenterologist for rectal bleeding.

## 2021-06-23 NOTE — ED Provider Notes (Signed)
[facility] EMERGENCY DEPARTMENT Provider Note   CSN: [id number] Arrival date & time: 06/23/21  1320     History Chief Complaint  Patient presents with   Back Pain   Melena    [name] is a 87 y.o. female.  Patient has a past medical history of hypertension, small bowel obstruction with partial bowel resection, hypertension, hypothyroidism, hyperlipidemia, diabetes type 1, remote breast cancer status postlumpectomy, atrial fibrillation, recurrent UTIs.  Patient presents the emergency department with development of lower back pain, abdominal pain, melanotic stools over the past two days.  She states that she has some lower back pain that hurts worse with movements that radiates through her abdomen into the front.  It hurts worse on the left side.  She started noticing the melanotic stools today.  She said that her stools were dark black.  This is never happened before.  She denies taking any iron supplements or Pepto-Bismol.  She has some vague epigastric abdominal pain and suprapubic abdominal pain with associated nausea.  She also states that she has been dealing with a urinary tract infection for about 2 months now it has been on several different antibiotics.  She denies any dysuria but does endorse frequent urination.  Patient denies any chest pain, shortness of breath, vomiting.   Back Pain Associated symptoms: abdominal pain and pelvic pain   Associated symptoms: no chest pain, no dysuria, no fever, no headaches and no weakness       Past Medical History:  Diagnosis Date   Anxiety    Arthritis    fingers   Bowel obstruction (HCC)    Cancer of upper-outer quadrant of female breast ([place]) 08/21/2011   ER +  PR +  Her 2 -  Ki67 9%   0.9 cm invasive lobular  Carcinoma ,s/p central lumpectomy with sentinel node biopsy,  ER/PR positive s/p re-excion on 09/16/11 with final pathology showing atypical hyperplasia    CAP (community acquired pneumonia) 12/06/2016   Diabetes  mellitus type 1 ([place])    type I- wears insulin pump   GERD (gastroesophageal reflux disease)    History of small bowel obstruction    Hypercholesteremia    Hypergastrinemia    Hypertension    Hypertension    Hyponatremia    Hypothyroid    Hypothyroidism    IBS (irritable bowel syndrome)    Osteoarthritis    Osteoporosis, post-menopausal    DEXA 12/02/11: -3.5 (with lomax gyn), declines bisphos due to bone pain   PAF (paroxysmal atrial fibrillation) ([place])    One episode in 2008, spontaneously converted in the ED, no further treatment   Spondylosis    thoracic spine   Thyroid nodule dx 07/2011   [place] q 18mo to follow calcified L thyroid nodule (3/55/97 [place])   Umbilical hernia     Patient Active Problem List   Diagnosis Date Noted   Acute cystitis 05/05/2021   Otitis media 04/22/2021   Otitis externa 04/22/2021   Strain of left trapezius muscle 04/22/2021   Anxiety 12/31/2020   Left leg swelling 03/05/2020   B12 deficiency 12/15/2019   Tingling in extremities 10/04/2019   Chronic thoracic back pain 08/16/2019   Atrial fibrillation ([place]) 02/01/2018   Leg cramps 07/05/2017   Constipation 01/25/2017   Closed fracture of proximal end of right humerus 07/09/2016   Cough 06/17/2016   Abdominal pain 03/10/2016   Hearing loss 01/03/2016   Allergic rhinitis 01/03/2016   Minimal Coronary Plaque by Cardiac Cath in  2013 09/11/2015   Frequent loose stools 08/28/2015   Hyponatremia    Acute mesenteric ischemia ([place])    Hypothyroidism 07/11/2012   Syncope 03/18/2012   Hypercholesteremia    Hypertension    Diabetes mellitus type 1 ([place])    Osteoarthritis    Thyroid nodule    Osteoporosis, post-menopausal    Primary cancer of upper outer quadrant of left female breast ([place]) 08/21/2011    Past Surgical History:  Procedure Laterality Date   ABDOMINAL HYSTERECTOMY     ABDOMINAL HYSTERECTOMY  yrs ago   APPENDECTOMY     APPENDECTOMY  age 34   BLADDER REPAIR  5 yrs ago   BREAST  LUMPECTOMY     BREAST SURGERY     biopsy   BREAST SURGERY   yrs ago   br bx   BREAST SURGERY  01/ 23/2013   left central lumpectomy and snbx, re-excision lumpectomy   CARDIOVASCULAR STRESS TEST  03/17/2012   colon surgery     Small intestines- took 6 inches out   COLONOSCOPY  07/11/2015   [facility] Dr Derrill Kay   ESOPHAGOGASTRODUODENOSCOPY  07/17/2010   [facility]   LEFT HEART CATHETERIZATION WITH CORONARY ANGIOGRAM N/A 03/18/2012   Procedure: LEFT HEART CATHETERIZATION WITH CORONARY ANGIOGRAM;  Surgeon: Minus Breeding, MD;  Location: [facility] CATH LAB;  Service: Cardiovascular;  Laterality: N/A;   TONSILLECTOMY   age 18     OB History   No obstetric history on file.     Family History  Problem Relation Age of Onset   Cancer Sister        unknown   Hypertension Mother    Stroke Mother    Arthritis Father    Heart disease Father    Diabetes Son    Colon cancer Neg Hx    Esophageal cancer Neg Hx     Social History   Tobacco Use   Smoking status: Former    Packs/day: 1.00    Years: 10.00    Pack years: 10.00    Types: Cigarettes    Quit date: 08/27/1977    Years since quitting: 43.8   Smokeless tobacco: Never  Vaping Use   Vaping Use: Never used  Substance Use Topics   Alcohol use: Yes    Comment: rare   Drug use: No    Home Medications Prior to Admission medications   Medication Sig Start Date End Date Taking? Authorizing Provider  metoCLOPramide (REGLAN) 10 MG tablet Take 1 tablet (10 mg total) by mouth every 6 (six) hours for 7 days. 06/23/21 06/30/21 Yes Libby Goehring, Adora Fridge, PA-C  acetic acid 2 % otic solution Place 4 drops into both ears every 3 (three) hours. 04/22/21   Binnie Rail, MD  ALPRAZolam Duanne Moron) 0.5 MG tablet TAKE 1/2 TABLET TWICE DAILY AS NEEDED FOR ANXIETY OR SLEEP 04/15/21   Binnie Rail, MD  aspirin EC 81 MG tablet Take 81 mg by mouth daily.    [provider]  azithromycin (ZITHROMAX) 250 MG tablet Take two tabs the first day and then one  tab daily for four days 04/22/21   Binnie Rail, MD  B Complex-C (SUPER B COMPLEX PO) Take 1 tablet by mouth daily.    [provider]  BD VEO INSULIN SYRINGE U/F 31G X 15/64" 0.3 ML MISC USE WITH INJECTIONS 10/15/20   Binnie Rail, MD  bisacodyl (DULCOLAX) 10 MG suppository Place 1 suppository (10 mg total) rectally as needed for moderate constipation. 04/27/19  Binnie Rail, MD  carboxymethylcellulose (REFRESH PLUS) 0.5 % SOLN Place 1 drop into both eyes 3 (three) times daily as needed. Non-preservative    [provider]  cholecalciferol (VITAMIN D) 1000 UNITS tablet Take 5,000 Units by mouth daily.     [provider]  Continuous Blood Gluc Sensor ([place]) MISC by Does not apply route. Sensor is located in Right arm    [provider]  DUREZOL 0.05 % EMUL Place 1 drop into the right eye 3 (three) times daily. 04/01/21   [provider]  fish oil-omega-3 fatty acids 1000 MG capsule Take 2 g by mouth daily.    [provider]  glucose blood test strip OneTouch Ultra Blue Test Strip    [provider]  hydrochlorothiazide (HYDRODIURIL) 25 MG tablet TAKE (1) TABLET DAILY AS NEEDED. 04/11/20   Binnie Rail, MD  insulin glargine (LANTUS) 100 UNIT/ML Solostar Pen Inject 14 Units into the skin daily at 10 pm.  07/29/14   [provider]  insulin lispro (HUMALOG) 100 UNIT/ML KiwkPen Inject into the skin. Sliding scale before meal    [provider]  Insulin Pen Needle (BD PEN NEEDLE NANO 2ND GEN) 32G X 4 MM MISC INJECT AS DIRECTED FOUR TIMES A DAY 08/01/19   [provider]  levothyroxine (SYNTHROID) 112 MCG tablet  03/22/20   [provider]  losartan (COZAAR) 100 MG tablet TAKE 1 TABLET ONCE DAILY. 03/20/21   Binnie Rail, MD  methocarbamol (ROBAXIN) 500 MG tablet Take 1 tablet (500 mg total) by mouth at bedtime as needed for muscle spasms. 04/22/21   Binnie Rail, MD  metoprolol  succinate (TOPROL-XL) 25 MG 24 hr tablet TAKE 1/2 TABLET AS NEEDED FOR PALPITATIONS. 02/21/21   Evans Lance, MD  moxifloxacin (VIGAMOX) 0.5 % ophthalmic solution Place 1 drop into the right eye 4 (four) times daily. 04/01/21   [provider]  Multiple Vitamin (MULTIVITAMIN) tablet Take 1 tablet by mouth daily. For breast and bone health    [provider]  nitrofurantoin, macrocrystal-monohydrate, (MACROBID) 100 MG capsule Take 1 capsule (100 mg total) by mouth 2 (two) times daily. 05/05/21   Binnie Rail, MD  ondansetron (ZOFRAN) 4 MG tablet Take 1 tablet (4 mg total) by mouth every 8 (eight) hours as needed for nausea or vomiting. 05/05/21   Binnie Rail, MD  saccharomyces boulardii (FLORASTOR) 250 MG capsule Take 1 capsule (250 mg total) by mouth 2 (two) times daily. 04/04/19   Binnie Rail, MD  simvastatin (ZOCOR) 20 MG tablet TAKE ONE TABLET AT BEDTIME. 11/27/20   Binnie Rail, MD    Allergies    Eliquis [apixaban], Amlodipine, Augmentin [amoxicillin-pot clavulanate], Cortisone, Keflex [cephalexin], Omeprazole, Prednisone, Hydrocodone, and Latex  Review of Systems   Review of Systems  Constitutional:  Negative for chills and fever.  HENT:  Negative for congestion, rhinorrhea and sore throat.   Eyes:  Negative for visual disturbance.  Respiratory:  Negative for cough, chest tightness and shortness of breath.   Cardiovascular:  Negative for chest pain, palpitations and leg swelling.  Gastrointestinal:  Positive for abdominal pain, blood in stool and nausea. Negative for constipation, diarrhea and vomiting.  Genitourinary:  Positive for flank pain, frequency and pelvic pain. Negative for dysuria and hematuria.  Musculoskeletal:  Positive for back pain.  Skin:  Negative for rash and wound.  Neurological:  Negative for dizziness, syncope, weakness, light-headedness and headaches.  Psychiatric/Behavioral:  Negative for confusion.   All other systems reviewed and are  negative.  Physical Exam Updated Vital Signs BP 139/62   Pulse 84   Temp 97.8 F (36.6 C) (Oral)   Resp 20   Ht $R'5\' 7"'Iw$  (1.702 m)   Wt 58.5 kg   SpO2 95%   BMI 20.20 kg/m   Physical Exam Vitals and nursing note reviewed.  Constitutional:      General: She is not in acute distress.    Appearance: Normal appearance. She is not ill-appearing, toxic-appearing or diaphoretic.  HENT:     Head: Normocephalic and atraumatic.     Nose: No nasal deformity.     Mouth/Throat:     Lips: Pink. No lesions.     Mouth: Mucous membranes are moist. No injury, lacerations, oral lesions or angioedema.     Pharynx: Oropharynx is clear. Uvula midline. No pharyngeal swelling, oropharyngeal exudate, posterior oropharyngeal erythema or uvula swelling.  Eyes:     General: Gaze aligned appropriately. No scleral icterus.       Right eye: No discharge.        Left eye: No discharge.     Conjunctiva/sclera: Conjunctivae normal.     Right eye: Right conjunctiva is not injected. No exudate or hemorrhage.    Left eye: Left conjunctiva is not injected. No exudate or hemorrhage. Cardiovascular:     Rate and Rhythm: Normal rate and regular rhythm.     Pulses: Normal pulses.          Radial pulses are 2+ on the right side and 2+ on the left side.       Dorsalis pedis pulses are 2+ on the right side and 2+ on the left side.     Heart sounds: Normal heart sounds, S1 normal and S2 normal. Heart sounds not distant. No murmur heard.   No friction rub. No gallop. No S3 or S4 sounds.  Pulmonary:     Effort: Pulmonary effort is normal. No accessory muscle usage or respiratory distress.     Breath sounds: Normal breath sounds. No stridor. No wheezing, rhonchi or rales.  Chest:     Chest wall: No tenderness.  Abdominal:     General: Abdomen is flat. Bowel sounds are normal. There is no distension.     Palpations: Abdomen is soft. There is no mass or pulsatile mass.     Tenderness: There is abdominal tenderness. There  is left CVA tenderness. There is no right CVA tenderness, guarding or rebound.     Comments: Generalized abdominal tenderness, most prominent in suprapubic and upper epigastric region; left CVA tenderness  Musculoskeletal:     Right lower leg: No edema.     Left lower leg: No edema.  Skin:    General: Skin is warm and dry.     Coloration: Skin is not jaundiced or pale.     Findings: No bruising, erythema, lesion or rash.  Neurological:     General: No focal deficit present.     Mental Status: She is alert and oriented to person, place, and time.     GCS: GCS eye subscore is 4. GCS verbal subscore is 5. GCS motor subscore is 6.  Psychiatric:        Mood and Affect: Mood normal.        Behavior: Behavior normal. Behavior is cooperative.    ED Results / Procedures / Treatments   Labs (all labs ordered are listed, but only abnormal results are displayed) Labs  Reviewed  COMPREHENSIVE METABOLIC PANEL - Abnormal; Notable for the following components:      Result Value   Sodium 129 (*)    Potassium 3.2 (*)    Chloride 94 (*)    Glucose, Bld 249 (*)    All other components within normal limits  URINALYSIS, ROUTINE W REFLEX MICROSCOPIC - Abnormal; Notable for the following components:   Glucose, UA 250 (*)    Hgb urine dipstick MODERATE (*)    All other components within normal limits  OCCULT BLOOD X 1 CARD TO LAB, STOOL - Abnormal; Notable for the following components:   Fecal Occult Bld POSITIVE (*)    All other components within normal limits  URINALYSIS, MICROSCOPIC (REFLEX) - Abnormal; Notable for the following components:   Bacteria, UA RARE (*)    All other components within normal limits  LIPASE, BLOOD  CBC WITH DIFFERENTIAL/PLATELET  TSH  TROPONIN I (HIGH SENSITIVITY)    EKG EKG Interpretation  Date/Time:  Monday June 23 2021 14:47:23 EST Ventricular Rate:  83 PR Interval:  175 QRS Duration: 92 QT Interval:  414 QTC Calculation: 487 R Axis:   31 Text  Interpretation: Sinus rhythm Anteroseptal infarct, old Confirmed by Godfrey Pick [phone]) on 06/23/2021 5:08:13 PM  Radiology CT Renal Stone Study  Result Date: 06/23/2021 CLINICAL DATA:  Acute lower back pain. EXAM: CT ABDOMEN AND PELVIS WITHOUT CONTRAST TECHNIQUE: Multidetector CT imaging of the abdomen and pelvis was performed following the standard protocol without IV contrast. COMPARISON:  May 10, 2021. FINDINGS: Lower chest: No acute abnormality. Hepatobiliary: No focal liver abnormality is seen. No gallstones, gallbladder wall thickening, or biliary dilatation. Pancreas: Unremarkable. No pancreatic ductal dilatation or surrounding inflammatory changes. Spleen: Normal in size without focal abnormality. Adrenals/Urinary Tract: Adrenal glands are unremarkable. Left kidney appears unremarkable. No hydronephrosis is noted. However, there is the suggestion of a soft tissue density within the upper pole calyx of the right kidney as well as the upper infundibulum. Transitional cell carcinoma cannot be excluded. Mild urinary bladder distention is noted. Stomach/Bowel: The stomach is unremarkable. No small bowel dilatation is noted. Status post appendectomy. Diffusely dilated large bowel is noted which is filled with fluid concerning for possible ileus. Vascular/Lymphatic: Aortic atherosclerosis. No enlarged abdominal or pelvic lymph nodes. Reproductive: Status post hysterectomy. No adnexal masses. Other: No abdominal wall hernia or abnormality. No abdominopelvic ascites. Musculoskeletal: No acute or significant osseous findings. IMPRESSION: There is the suggestion of a soft tissue density within the upper pole calyx and infundibulum of the right kidney concerning for mass or neoplasm. Further evaluation with MRI is recommended. Diffusely dilated large bowel is noted which is filled with fluid concerning for ileus. Aortic Atherosclerosis (ICD10-I70.0). Electronically Signed   By: Marijo Conception M.D.   On:  06/23/2021 15:45    Procedures Procedures   Medications Ordered in ED Medications  sodium chloride 0.9 % bolus 1,000 mL (0 mLs Intravenous Stopped 06/23/21 1848)  potassium chloride SA (KLOR-CON) CR tablet 40 mEq (40 mEq Oral Given 06/23/21 1811)  magnesium oxide (MAG-OX) tablet 800 mg (800 mg Oral Given 06/23/21 1809)  acetaminophen (TYLENOL) tablet 650 mg (650 mg Oral Given 06/23/21 1918)  metoCLOPramide (REGLAN) injection 5 mg (5 mg Intravenous Given 06/23/21 1918)    ED Course  I have reviewed the triage vital signs and the nursing notes.  Pertinent labs & imaging results that were available during my care of the patient were reviewed by me and considered in my medical decision making (  see chart for details).  Clinical Course as of 06/23/21 1959  Mon Jun 23, 2021  1449 CT Renal [name] [GL]    Clinical Course User Index [GL] Sherre Poot Adora Fridge, PA-C   MDM Rules/Calculators/A&P                          Patient presents with back pain, abdominal pain, and melanotic stools that started over the past 2 days. On arrival, patient is afebrile and vitals stable. Exam with generalized abdominal tenderness and left CVA tenderness. Non peritonitic. Patient appears to be in no acute distress.  I reviewed all labs and imaging.  - CMP: hyponatremia to 129, appears chronic, asymptomatic; K of 3.2, replaced in ED; Glucose 249 with no anion gap or metabolic acidosis noted - CBC: No anemia or leukocytosis. Reassuring - Lipase normal - Troponin normal - Positive fecal occult - UA with no suggestive findings of UTI - EKG reassuring - CT stone study with no obstructive stones. Incidental findings of ileus, right renal mass, and Distended bladder.  Post voiding residual bladder scan with 450 ccs  Interventions - Mag and reglan for ileus - Potassium for replacement - 1 L IVF bolus for hyponatremia - Tylenol for pain  Discussed these findings with the patient.  She has a history of  bowel obstruction with previous resection and current ileus, so concern for development of possible obstruction if this worsens.  No nausea or vomiting noted and able to tolerate p.o. patient wishes to try to treat this at home supportively versus admission.  Strict return precautions provided.  Also found to have urinary retention.  Will place Foley to decompress which may help facilitate bowel movements.  Will discharge home on Reglan.  Incidental finding of right renal mass noted on CT scan.  Scheduled outpatient for MRI.  Needs to follow-up with PCP to facilitate follow-ups for this.  Patient with positive Hemoccult however no signs of severe anemia.  I have concern for potential malignancy given history of breast cancer, renal mass found on CT, and new melena with ileus.  Follow-up with PCP regarding further management of this.  Continue to monitor stool for continued rectal bleeding.  Needs to follow-up with urology in a couple of days for evaluation of urinary retention and possible Foley catheter removal.  Final Clinical Impression(s) / ED Diagnoses Final diagnoses:  Ileus ([place])  Right kidney mass  Urinary retention  Rectal bleeding    Rx / DC Orders ED Discharge Orders          Ordered    MR ABDOMEN [facility] CONTRAST        06/23/21 1746    metoCLOPramide (REGLAN) 10 MG tablet  Every 6 hours        06/23/21 1751             Sheila Oats 06/23/21 2000    Luna Fuse, MD 06/30/21 1606

## 2021-06-23 NOTE — Telephone Encounter (Signed)
Pt. Connected to [facility] [employer].   -Caller states she is having severe lower back pain and is going into her legs. She states pain is 10 out of 10. Started Friday. Pt. being seen by Dr. [facility] for kidney infection. On abx for the infection. Pt. called on call for renal over the weekend and they don't think the pain is related to kidney infection. Pain is in lower back all the way through to her stomach. Pt. having to use a walker.   Pt. Advised to go to ED. Pt. Understands and complies.

## 2021-06-23 NOTE — ED Notes (Signed)
Pt. Reports she had a pain in her back on Friday and has had severe pain since with inability to walk or move WNL since the pain hit her.  Pt. Also reports she had one black stool today but has been on abx. For a UTI with a change in her abx due to the first abx not working.  Pt.  Is alert and oriented with only c/o pain with movements.

## 2021-06-23 NOTE — ED Triage Notes (Signed)
Lower back pain since Friday, being treated for UTI with levofloxacin, c/o black stool

## 2021-06-24 LAB — TSH: TSH: 11.786 u[IU]/mL — ABNORMAL HIGH (ref 0.350–4.500)

## 2021-06-25 ENCOUNTER — Telehealth: Payer: Self-pay | Admitting: Internal Medicine

## 2021-06-25 DIAGNOSIS — N2889 Other specified disorders of kidney and ureter: Secondary | ICD-10-CM

## 2021-06-25 NOTE — Telephone Encounter (Signed)
Patient calling in  Patient says she was seen in ED & they advised her that an MRI was needed   MRI order was placed by ED provider but patient says she was told that MRI order has to come directly from Dr. Quay Burow  Requesting cb from nurse [phone]

## 2021-06-26 DIAGNOSIS — R9349 Abnormal radiologic findings on diagnostic imaging of other urinary organs: Secondary | ICD-10-CM | POA: Diagnosis not present

## 2021-06-26 DIAGNOSIS — R310 Gross hematuria: Secondary | ICD-10-CM | POA: Diagnosis not present

## 2021-06-26 DIAGNOSIS — R3121 Asymptomatic microscopic hematuria: Secondary | ICD-10-CM | POA: Diagnosis not present

## 2021-06-26 NOTE — Telephone Encounter (Signed)
Mri ordered.  Looks like she has an appt with me next week

## 2021-06-26 NOTE — Telephone Encounter (Signed)
Noted - mri cancelled

## 2021-06-26 NOTE — Telephone Encounter (Signed)
Spoke with patient today. Patient seen and evaluated with [facility] and they have told her she does not need to have MRI done and they are doing further work up on her.

## 2021-06-28 ENCOUNTER — Ambulatory Visit (HOSPITAL_BASED_OUTPATIENT_CLINIC_OR_DEPARTMENT_OTHER): Payer: Medicare PPO

## 2021-06-29 NOTE — Progress Notes (Signed)
Subjective:    Patient ID: [name], female    DOB: 1929/06/29, 87 y.o.   MRN: [id number]  This visit occurred during the SARS-CoV-2 public health emergency.  Safety protocols were in place, including screening questions prior to the visit, additional usage of staff PPE, and extensive cleaning of exam room while observing appropriate contact time as indicated for disinfecting solutions.     HPI The patient is here for follow up from the hospital.    ED 11/14 for back pain, abdominal pain and melena.  The pain isin her lower back that is worse with movement and radiates through to her abdomen.   Pain is worse on the left side.  She noticed melanic stools that day.  She had some nausea.    Tsh was elevated.  NA was low.  Fecal occult was positive.  Ct scan showed possible right upper kidney mass and ileus.  She received magnesium and reglan for the ileus.  She was found to have urinary t.  Foley placed to decompress.    She has seen urology.  They will do a cystoscopy and retrogrades with pyeloscopy to evaluate the CT findings further.    She continues to have right lower back pain that is severe.  She is not able to do some of her ADLs without assistance.  The pain from the back seems to come forward to the lower abdomen.  She is experiencing urinary frequency, some dysuria or urethral discomfort and at times is not able to produce much urine.  She feels her symptoms are consistent with a UTI.   Medications and allergies reviewed with patient and updated if appropriate.  Patient Active Problem List   Diagnosis Date Noted   Acute cystitis 05/05/2021   Otitis media 04/22/2021   Otitis externa 04/22/2021   Strain of left trapezius muscle 04/22/2021   Anxiety 12/31/2020   Left leg swelling 03/05/2020   B12 deficiency 12/15/2019   Tingling in extremities 10/04/2019   Chronic thoracic back pain 08/16/2019   Atrial fibrillation ([place]) 02/01/2018   Leg cramps 07/05/2017    Constipation 01/25/2017   Closed fracture of proximal end of right humerus 07/09/2016   Cough 06/17/2016   Abdominal pain 03/10/2016   Hearing loss 01/03/2016   Allergic rhinitis 01/03/2016   Minimal Coronary Plaque by Cardiac Cath in 2013 09/11/2015   Frequent loose stools 08/28/2015   Hyponatremia    Acute mesenteric ischemia ([place])    Hypothyroidism 07/11/2012   Syncope 03/18/2012   Hypercholesteremia    Hypertension    Diabetes mellitus type 1 ([place])    Osteoarthritis    Thyroid nodule    Osteoporosis, post-menopausal    Primary cancer of upper outer quadrant of left female breast ([place]) 08/21/2011    Current Outpatient Medications on File Prior to Visit  Medication Sig Dispense Refill   acetic acid 2 % otic solution Place 4 drops into both ears every 3 (three) hours. 15 mL 0   ALPRAZolam (XANAX) 0.5 MG tablet TAKE 1/2 TABLET TWICE DAILY AS NEEDED FOR ANXIETY OR SLEEP 30 tablet 5   aspirin EC 81 MG tablet Take 81 mg by mouth daily.     azithromycin (ZITHROMAX) 250 MG tablet Take two tabs the first day and then one tab daily for four days 6 tablet 0   B Complex-C (SUPER B COMPLEX PO) Take 1 tablet by mouth daily.     BD VEO INSULIN SYRINGE U/F 31G X 15/64"  0.3 ML MISC USE WITH INJECTIONS 100 each 2   bisacodyl (DULCOLAX) 10 MG suppository Place 1 suppository (10 mg total) rectally as needed for moderate constipation. 12 suppository 0   carboxymethylcellulose (REFRESH PLUS) 0.5 % SOLN Place 1 drop into both eyes 3 (three) times daily as needed. Non-preservative     cholecalciferol (VITAMIN D) 1000 UNITS tablet Take 5,000 Units by mouth daily.      Continuous Blood Gluc Sensor ([place]) MISC by Does not apply route. Sensor is located in Right arm     DUREZOL 0.05 % EMUL Place 1 drop into the right eye 3 (three) times daily.     fish oil-omega-3 fatty acids 1000 MG capsule Take 2 g by mouth daily.     glucose blood test strip OneTouch Ultra Blue Test Strip      hydrochlorothiazide (HYDRODIURIL) 25 MG tablet TAKE (1) TABLET DAILY AS NEEDED. 90 tablet 0   insulin glargine (LANTUS) 100 UNIT/ML Solostar Pen Inject 14 Units into the skin daily at 10 pm.      insulin lispro (HUMALOG) 100 UNIT/ML KiwkPen Inject into the skin. Sliding scale before meal     Insulin Pen Needle (BD PEN NEEDLE NANO 2ND GEN) 32G X 4 MM MISC INJECT AS DIRECTED FOUR TIMES A DAY     levothyroxine (SYNTHROID) 112 MCG tablet      losartan (COZAAR) 100 MG tablet TAKE 1 TABLET ONCE DAILY. 90 tablet 0   methocarbamol (ROBAXIN) 500 MG tablet Take 1 tablet (500 mg total) by mouth at bedtime as needed for muscle spasms. 30 tablet 5   metoCLOPramide (REGLAN) 10 MG tablet Take 1 tablet (10 mg total) by mouth every 6 (six) hours for 7 days. 28 tablet 0   metoprolol succinate (TOPROL-XL) 25 MG 24 hr tablet TAKE 1/2 TABLET AS NEEDED FOR PALPITATIONS. 45 tablet 3   moxifloxacin (VIGAMOX) 0.5 % ophthalmic solution Place 1 drop into the right eye 4 (four) times daily.     Multiple Vitamin (MULTIVITAMIN) tablet Take 1 tablet by mouth daily. For breast and bone health     ondansetron (ZOFRAN) 4 MG tablet Take 1 tablet (4 mg total) by mouth every 8 (eight) hours as needed for nausea or vomiting. 20 tablet 0   saccharomyces boulardii (FLORASTOR) 250 MG capsule Take 1 capsule (250 mg total) by mouth 2 (two) times daily. 60 capsule 5   simvastatin (ZOCOR) 20 MG tablet TAKE ONE TABLET AT BEDTIME. 90 tablet 1   prednisoLONE acetate (PRED FORTE) 1 % ophthalmic suspension prednisolone acetate 1 % eye drops,suspension  place 1 drop into the affected eye 4 times a day for 5 days.     No current facility-administered medications on file prior to visit.    Past Medical History:  Diagnosis Date   Anxiety    Arthritis    fingers   Bowel obstruction ([place])    Cancer of upper-outer quadrant of female breast ([place]) 08/21/2011   ER +  PR +  Her 2 -  Ki67 9%   0.9 cm invasive lobular  Carcinoma ,s/p central  lumpectomy with sentinel node biopsy,  ER/PR positive s/p re-excion on 09/16/11 with final pathology showing atypical hyperplasia    CAP (community acquired pneumonia) 12/06/2016   Diabetes mellitus type 1 ([place])    type I- wears insulin pump   GERD (gastroesophageal reflux disease)    History of small bowel obstruction    Hypercholesteremia    Hypergastrinemia    Hypertension  Hypertension    Hyponatremia    Hypothyroid    Hypothyroidism    IBS (irritable bowel syndrome)    Osteoarthritis    Osteoporosis, post-menopausal    DEXA 12/02/11: -3.5 (with lomax gyn), declines bisphos due to bone pain   PAF (paroxysmal atrial fibrillation) ([facility])    One episode in 2008, spontaneously converted in the ED, no further treatment   Spondylosis    thoracic spine   Thyroid nodule dx 07/2011   [place] q 74mo to follow calcified L thyroid nodule (12/08/11 [place])   Umbilical hernia     Past Surgical History:  Procedure Laterality Date   ABDOMINAL HYSTERECTOMY     ABDOMINAL HYSTERECTOMY  yrs ago   APPENDECTOMY     APPENDECTOMY  age 57   BLADDER REPAIR  5 yrs ago   BREAST LUMPECTOMY     BREAST SURGERY     biopsy   BREAST SURGERY   yrs ago   br bx   BREAST SURGERY  01/ 23/2013   left central lumpectomy and snbx, re-excision lumpectomy   CARDIOVASCULAR STRESS TEST  03/17/2012   colon surgery     Small intestines- took 6 inches out   COLONOSCOPY  07/11/2015   [facility] Dr Alycia Rossetti   ESOPHAGOGASTRODUODENOSCOPY  07/17/2010   [facility]   LEFT HEART CATHETERIZATION WITH CORONARY ANGIOGRAM N/A 03/18/2012   Procedure: LEFT HEART CATHETERIZATION WITH CORONARY ANGIOGRAM;  Surgeon: Rollene Rotunda, MD;  Location: [facility] CATH LAB;  Service: Cardiovascular;  Laterality: N/A;   TONSILLECTOMY   age 61    Social History   Socioeconomic History   Marital status: Married    Spouse name: Not on file   Number of children: Not on file   Years of education: Not on file   Highest education level: Not on file   Occupational History   Not on file  Tobacco Use   Smoking status: Former    Packs/day: 1.00    Years: 10.00    Pack years: 10.00    Types: Cigarettes    Quit date: 08/27/1977    Years since quitting: 43.8   Smokeless tobacco: Never  Vaping Use   Vaping Use: Never used  Substance and Sexual Activity   Alcohol use: Yes    Comment: rare   Drug use: No   Sexual activity: Yes    Birth control/protection: Post-menopausal  Other Topics Concern   Not on file  Social History Narrative   ** Merged History Encounter **       Social Determinants of Health   Financial Resource Strain: Not on file  Food Insecurity: Not on file  Transportation Needs: Not on file  Physical Activity: Not on file  Stress: Not on file  Social Connections: Not on file    Family History  Problem Relation Age of Onset   Cancer Sister        unknown   Hypertension Mother    Stroke Mother    Arthritis Father    Heart disease Father    Diabetes Son    Colon cancer Neg Hx    Esophageal cancer Neg Hx     Review of Systems  Constitutional:  Positive for appetite change. Negative for fever.  Gastrointestinal:  Positive for abdominal pain (suprapubic) and nausea.  Genitourinary:  Positive for decreased urine volume, dysuria and frequency. Negative for hematuria.       Dark urine  Musculoskeletal:  Positive for back pain.      Objective:  Vitals:   06/30/21 1438  BP: 130/74  Pulse: 89  Temp: 98.7 F (37.1 C)  SpO2: 96%   BP Readings from Last 3 Encounters:  06/30/21 130/74  06/23/21 (!) 165/75  05/10/21 (!) 161/72   Wt Readings from Last 3 Encounters:  06/30/21 129 lb (58.5 kg)  06/23/21 129 lb (58.5 kg)  05/10/21 130 lb (59 kg)   Body mass index is 20.2 kg/m.   Physical Exam    Constitutional: Appears well-developed and well-nourished. No distress.  HENT:  Head: Normocephalic and atraumatic.  Neck: Neck supple. No tracheal deviation present. No thyromegaly present.  No cervical  lymphadenopathy Cardiovascular: Normal rate, regular rhythm and normal heart sounds.   No murmur heard. No carotid bruit .  No edema Pulmonary/Chest: Effort normal and breath sounds normal. No respiratory distress. No has no wheezes. No rales. Abdomen: Soft, mild suprapubic tenderness without rebound or guarding Musculoskeletal: Right lower back pain just lateral to lumbar spine.  No tenderness along lumbar spine.  Any movement increases pain Skin: Skin is warm and dry. Not diaphoretic.     Lab Results  Component Value Date   WBC 6.7 06/23/2021   HGB 13.1 06/23/2021   HCT 37.3 06/23/2021   PLT 243 06/23/2021   GLUCOSE 249 (H) 06/23/2021   CHOL 169 10/04/2019   TRIG 99.0 10/04/2019   HDL 54.50 10/04/2019   LDLCALC 95 10/04/2019   ALT 14 06/23/2021   AST 25 06/23/2021   NA 129 (L) 06/23/2021   K 3.2 (L) 06/23/2021   CL 94 (L) 06/23/2021   CREATININE 0.81 06/23/2021   BUN 13 06/23/2021   CO2 25 06/23/2021   TSH 11.786 (H) 06/23/2021   INR 1.16 03/18/2012   HGBA1C 7.8 (H) 07/16/2020    CT Renal Stone Study CLINICAL DATA:  Acute lower back pain.  EXAM: CT ABDOMEN AND PELVIS WITHOUT CONTRAST  TECHNIQUE: Multidetector CT imaging of the abdomen and pelvis was performed following the standard protocol without IV contrast.  COMPARISON:  May 10, 2021.  FINDINGS: Lower chest: No acute abnormality.  Hepatobiliary: No focal liver abnormality is seen. No gallstones, gallbladder wall thickening, or biliary dilatation.  Pancreas: Unremarkable. No pancreatic ductal dilatation or surrounding inflammatory changes.  Spleen: Normal in size without focal abnormality.  Adrenals/Urinary Tract: Adrenal glands are unremarkable. Left kidney appears unremarkable. No hydronephrosis is noted. However, there is the suggestion of a soft tissue density within the upper pole calyx of the right kidney as well as the upper infundibulum. Transitional cell carcinoma cannot be excluded. Mild  urinary bladder distention is noted.  Stomach/Bowel: The stomach is unremarkable. No small bowel dilatation is noted. Status post appendectomy. Diffusely dilated large bowel is noted which is filled with fluid concerning for possible ileus.  Vascular/Lymphatic: Aortic atherosclerosis. No enlarged abdominal or pelvic lymph nodes.  Reproductive: Status post hysterectomy. No adnexal masses.  Other: No abdominal wall hernia or abnormality. No abdominopelvic ascites.  Musculoskeletal: No acute or significant osseous findings.  IMPRESSION: There is the suggestion of a soft tissue density within the upper pole calyx and infundibulum of the right kidney concerning for mass or neoplasm. Further evaluation with MRI is recommended.  Diffusely dilated large bowel is noted which is filled with fluid concerning for ileus.  Aortic Atherosclerosis (ICD10-I70.0).  Electronically Signed   By: Lupita Raider M.D.   On: 06/23/2021 15:45   Assessment & Plan:    See Problem List for Assessment and Plan of chronic medical problems.

## 2021-06-30 ENCOUNTER — Encounter: Payer: Self-pay | Admitting: Internal Medicine

## 2021-06-30 ENCOUNTER — Ambulatory Visit: Payer: Medicare PPO | Admitting: Internal Medicine

## 2021-06-30 ENCOUNTER — Ambulatory Visit (INDEPENDENT_AMBULATORY_CARE_PROVIDER_SITE_OTHER): Payer: Medicare PPO

## 2021-06-30 ENCOUNTER — Other Ambulatory Visit: Payer: Self-pay

## 2021-06-30 VITALS — BP 130/74 | HR 89 | Temp 98.7°F | Ht 67.0 in | Wt 129.0 lb

## 2021-06-30 DIAGNOSIS — F411 Generalized anxiety disorder: Secondary | ICD-10-CM | POA: Diagnosis not present

## 2021-06-30 DIAGNOSIS — M545 Low back pain, unspecified: Secondary | ICD-10-CM

## 2021-06-30 DIAGNOSIS — R300 Dysuria: Secondary | ICD-10-CM | POA: Diagnosis not present

## 2021-06-30 DIAGNOSIS — N2889 Other specified disorders of kidney and ureter: Secondary | ICD-10-CM | POA: Diagnosis not present

## 2021-06-30 DIAGNOSIS — N3001 Acute cystitis with hematuria: Secondary | ICD-10-CM | POA: Diagnosis not present

## 2021-06-30 LAB — POC URINALSYSI DIPSTICK (AUTOMATED)
Bilirubin, UA: NEGATIVE
Glucose, UA: POSITIVE — AB
Ketones, UA: NEGATIVE
Nitrite, UA: POSITIVE
Protein, UA: POSITIVE — AB
Spec Grav, UA: 1.015 (ref 1.010–1.025)
Urobilinogen, UA: NEGATIVE E.U./dL — AB
pH, UA: 6 (ref 5.0–8.0)

## 2021-06-30 MED ORDER — TRAMADOL HCL 50 MG PO TABS: 50.0000 mg | ORAL_TABLET | Freq: Three times a day (TID) | ORAL | 0 refills | Status: AC | PRN

## 2021-06-30 MED ORDER — ONDANSETRON HCL 4 MG PO TABS: 4.0000 mg | ORAL_TABLET | Freq: Three times a day (TID) | ORAL | 0 refills | Status: DC | PRN

## 2021-06-30 MED ORDER — NITROFURANTOIN MONOHYD MACRO 100 MG PO CAPS: 100.0000 mg | ORAL_CAPSULE | Freq: Two times a day (BID) | ORAL | 0 refills | Status: DC

## 2021-06-30 MED ORDER — ALPRAZOLAM 0.5 MG PO TABS: ORAL_TABLET | ORAL | 5 refills | Status: DC

## 2021-06-30 NOTE — Assessment & Plan Note (Signed)
Acute Urine dip consistent with UTI Will send urine for culture Take the antibiotic as prescribed.  Start nitrofurantoin 100 mg twice daily x7 days Take tylenol if needed.   Increase your water intake.  Call if no improvement

## 2021-06-30 NOTE — Assessment & Plan Note (Signed)
Acute Right lower back pain not related to kidney mass Seems less likely related to UTI since the pain improves at rest and worsens with activity Pain sounds more musculoskeletal She does have osteoporosis and concern for possible vertebral fracture Lumbar x-ray stat Tramadol 50-100 mg 3 times daily for pain-discussed side effects

## 2021-06-30 NOTE — Patient Instructions (Addendum)
   Have an xray downstairs.     Medications changes include :   antibiotic for the UTI.  Tramadol for pain.    Your prescription(s) have been submitted to your pharmacy. Please take as directed and contact our office if you believe you are having problem(s) with the medication(s).    Please call if there is no improvement in your symptoms.

## 2021-06-30 NOTE — Assessment & Plan Note (Signed)
Acute Recent CT scan in the ED showed possible soft tissue density or mass in the upper pole of the right kidney Following with urology and they will evaluate further

## 2021-07-01 ENCOUNTER — Telehealth: Payer: Self-pay | Admitting: Internal Medicine

## 2021-07-01 DIAGNOSIS — N2889 Other specified disorders of kidney and ureter: Secondary | ICD-10-CM

## 2021-07-01 NOTE — Telephone Encounter (Signed)
Patient requesting a call back to discuss existing referral to urology  Please call patient [phone]

## 2021-07-02 LAB — CULTURE, URINE COMPREHENSIVE

## 2021-07-02 NOTE — Telephone Encounter (Signed)
I can refer her to urology in [place] has a practice there.  The other option is [facility]rology on [facility] in [place] does she prefer?

## 2021-07-02 NOTE — Telephone Encounter (Signed)
Referral ordered

## 2021-07-07 ENCOUNTER — Telehealth: Payer: Self-pay | Admitting: Internal Medicine

## 2021-07-07 DIAGNOSIS — N3001 Acute cystitis with hematuria: Secondary | ICD-10-CM

## 2021-07-07 NOTE — Telephone Encounter (Signed)
Patient states she finished last dose of antibiotic for uti today. Patient is requesting a lab order for a UA to make sure uti is gone

## 2021-07-07 NOTE — Telephone Encounter (Signed)
Okay-urine studies ordered

## 2021-07-07 NOTE — Telephone Encounter (Signed)
Spoke with patient today. 

## 2021-07-08 ENCOUNTER — Other Ambulatory Visit (INDEPENDENT_AMBULATORY_CARE_PROVIDER_SITE_OTHER): Payer: Medicare PPO

## 2021-07-08 ENCOUNTER — Other Ambulatory Visit: Payer: Self-pay

## 2021-07-08 DIAGNOSIS — N3001 Acute cystitis with hematuria: Secondary | ICD-10-CM

## 2021-07-08 LAB — URINALYSIS, ROUTINE W REFLEX MICROSCOPIC
Bilirubin Urine: NEGATIVE
Hgb urine dipstick: NEGATIVE
Ketones, ur: NEGATIVE
Leukocytes,Ua: NEGATIVE
Nitrite: NEGATIVE
Specific Gravity, Urine: <=1.005 — AB (ref 1.000–1.030)
Total Protein, Urine: NEGATIVE
Urine Glucose: NEGATIVE
Urobilinogen, UA: 0.2 (ref 0.0–1.0)
pH: 5.5 (ref 5.0–8.0)

## 2021-07-09 ENCOUNTER — Encounter: Payer: Self-pay | Admitting: Internal Medicine

## 2021-07-09 DIAGNOSIS — M549 Dorsalgia, unspecified: Secondary | ICD-10-CM

## 2021-07-09 LAB — URINE CULTURE: Result:: NO GROWTH

## 2021-07-13 NOTE — Addendum Note (Signed)
Addended by: Binnie Rail on: 07/13/2021 12:03 PM   Modules accepted: Orders

## 2021-07-14 ENCOUNTER — Ambulatory Visit: Payer: Medicare PPO | Admitting: Internal Medicine

## 2021-07-14 ENCOUNTER — Ambulatory Visit (INDEPENDENT_AMBULATORY_CARE_PROVIDER_SITE_OTHER): Payer: Medicare PPO

## 2021-07-14 ENCOUNTER — Other Ambulatory Visit: Payer: Medicare PPO

## 2021-07-14 ENCOUNTER — Other Ambulatory Visit: Payer: Self-pay

## 2021-07-14 VITALS — BP 126/68 | HR 77 | Ht 67.0 in | Wt 124.0 lb

## 2021-07-14 DIAGNOSIS — I480 Paroxysmal atrial fibrillation: Secondary | ICD-10-CM | POA: Diagnosis not present

## 2021-07-14 DIAGNOSIS — R109 Unspecified abdominal pain: Secondary | ICD-10-CM | POA: Diagnosis not present

## 2021-07-14 DIAGNOSIS — M5136 Other intervertebral disc degeneration, lumbar region: Secondary | ICD-10-CM | POA: Diagnosis not present

## 2021-07-14 DIAGNOSIS — M549 Dorsalgia, unspecified: Secondary | ICD-10-CM

## 2021-07-14 NOTE — Patient Instructions (Signed)
Medication Instructions:  Your physician recommends that you continue on your current medications as directed. Please refer to the Current Medication list given to you today.  *If you need a refill on your cardiac medications before your next appointment, please call your pharmacy*   Lab Work: None ordered   Testing/Procedures: None ordered   Follow-Up: At [facility], you and your health needs are our priority.  As part of our continuing mission to provide you with exceptional heart care, we have created designated Provider Care Teams.  These Care Teams include your primary Cardiologist (physician) and Advanced Practice Providers (APPs -  Physician Assistants and Nurse Practitioners) who all work together to provide you with the care you need, when you need it.  Your next appointment:   6 month(s)  The format for your next appointment:   In Person  Provider:   Cristopher Peru, MD    Thank you for choosing [facility]!!   [phone]

## 2021-07-14 NOTE — Progress Notes (Signed)
HPI Mrs. Zentz returns today for followup. She is a pleasant 87 yo woman with a h/o HTN, PAF, and hypothyoidism. She has been diagnosed with hematuria and may have a renal lesion. Her workup is ongoing. She has minimal palpitations. She has not had more bleeding. She notes some non-specific back pain.  Allergies  Allergen Reactions   Eliquis [Apixaban] Other (See Comments)    Dizziness, severe headach   Amlodipine     Dizzy, nausea   Augmentin [Amoxicillin-Pot Clavulanate] Other (See Comments)    Gi upset only no rash or hives    Cortisone Other (See Comments)    Makes blood sugars run high   Keflex [Cephalexin] Hives and Nausea Only    Stomach upset   Omeprazole Nausea Only    Dizziness and nausea   Prednisone     Other reaction(s): Other Makes blood sugars run high   Hydrocodone Rash    Rash to chest , side back   Latex Rash    Powder in gloves causes rash; "not allergic to latex just the powder"     Current Outpatient Medications  Medication Sig Dispense Refill   acetic acid 2 % otic solution Place 4 drops into both ears every 3 (three) hours. 15 mL 0   ALPRAZolam (XANAX) 0.5 MG tablet TAKE 1/2 TABLET TWICE DAILY AS NEEDED FOR ANXIETY OR SLEEP 30 tablet 5   aspirin EC 81 MG tablet Take 81 mg by mouth daily.     B Complex-C (SUPER B COMPLEX PO) Take 1 tablet by mouth daily.     BD VEO INSULIN SYRINGE U/F 31G X 15/64" 0.3 ML MISC USE WITH INJECTIONS 100 each 2   bisacodyl (DULCOLAX) 10 MG suppository Place 1 suppository (10 mg total) rectally as needed for moderate constipation. 12 suppository 0   carboxymethylcellulose (REFRESH PLUS) 0.5 % SOLN Place 1 drop into both eyes 3 (three) times daily as needed. Non-preservative     cholecalciferol (VITAMIN D) 1000 UNITS tablet Take 5,000 Units by mouth daily.      Continuous Blood Gluc Sensor ([place]) MISC by Does not apply route. Sensor is located in Right arm     DUREZOL 0.05 % EMUL Place 1 drop  into the right eye 3 (three) times daily.     fish oil-omega-3 fatty acids 1000 MG capsule Take 2 g by mouth daily.     glucose blood test strip OneTouch Ultra Blue Test Strip     hydrochlorothiazide (HYDRODIURIL) 25 MG tablet TAKE (1) TABLET DAILY AS NEEDED. 90 tablet 0   insulin glargine (LANTUS) 100 UNIT/ML Solostar Pen Inject 14 Units into the skin daily at 10 pm.      insulin lispro (HUMALOG) 100 UNIT/ML KiwkPen Inject into the skin. Sliding scale before meal     Insulin Pen Needle (BD PEN NEEDLE NANO 2ND GEN) 32G X 4 MM MISC INJECT AS DIRECTED FOUR TIMES A DAY     levothyroxine (SYNTHROID) 112 MCG tablet      losartan (COZAAR) 100 MG tablet TAKE 1 TABLET ONCE DAILY. 90 tablet 0   methocarbamol (ROBAXIN) 500 MG tablet Take 1 tablet (500 mg total) by mouth at bedtime as needed for muscle spasms. 30 tablet 5   metoprolol succinate (TOPROL-XL) 25 MG 24 hr tablet TAKE 1/2 TABLET AS NEEDED FOR PALPITATIONS. 45 tablet 3   moxifloxacin (VIGAMOX) 0.5 % ophthalmic solution Place 1 drop into the right eye 4 (four) times daily.  Multiple Vitamin (MULTIVITAMIN) tablet Take 1 tablet by mouth daily. For breast and bone health     nitrofurantoin, macrocrystal-monohydrate, (MACROBID) 100 MG capsule Take 1 capsule (100 mg total) by mouth 2 (two) times daily. 14 capsule 0   ondansetron (ZOFRAN) 4 MG tablet Take 1 tablet (4 mg total) by mouth every 8 (eight) hours as needed for nausea or vomiting. 20 tablet 0   prednisoLONE acetate (PRED FORTE) 1 % ophthalmic suspension prednisolone acetate 1 % eye drops,suspension  place 1 drop into the affected eye 4 times a day for 5 days.     saccharomyces boulardii (FLORASTOR) 250 MG capsule Take 1 capsule (250 mg total) by mouth 2 (two) times daily. 60 capsule 5   simvastatin (ZOCOR) 20 MG tablet TAKE ONE TABLET AT BEDTIME. 90 tablet 1   metoCLOPramide (REGLAN) 10 MG tablet Take 1 tablet (10 mg total) by mouth every 6 (six) hours for 7 days. 28 tablet 0   No current  facility-administered medications for this visit.     Past Medical History:  Diagnosis Date   Anxiety    Arthritis    fingers   Bowel obstruction ([place])    Cancer of upper-outer quadrant of female breast ([place]) 08/21/2011   ER +  PR +  Her 2 -  Ki67 9%   0.9 cm invasive lobular  Carcinoma ,s/p central lumpectomy with sentinel node biopsy,  ER/PR positive s/p re-excion on 09/16/11 with final pathology showing atypical hyperplasia    CAP (community acquired pneumonia) 12/06/2016   Diabetes mellitus type 1 ([place])    type I- wears insulin pump   GERD (gastroesophageal reflux disease)    History of small bowel obstruction    Hypercholesteremia    Hypergastrinemia    Hypertension    Hypertension    Hyponatremia    Hypothyroid    Hypothyroidism    IBS (irritable bowel syndrome)    Osteoarthritis    Osteoporosis, post-menopausal    DEXA 12/02/11: -3.5 (with lomax gyn), declines bisphos due to bone pain   PAF (paroxysmal atrial fibrillation) ([place])    One episode in 2008, spontaneously converted in the ED, no further treatment   Spondylosis    thoracic spine   Thyroid nodule dx 07/2011   [place] q 87moto follow calcified L thyroid nodule (47[place]   Umbilical hernia     ROS:   All systems reviewed and negative except as noted in the HPI.   Past Surgical History:  Procedure Laterality Date   ABDOMINAL HYSTERECTOMY     ABDOMINAL HYSTERECTOMY  yrs ago   APPENDECTOMY     APPENDECTOMY  age 87  BLADDER REPAIR  5 yrs ago   BREAST LUMPECTOMY     BREAST SURGERY     biopsy   BREAST SURGERY   yrs ago   br bx   BREAST SURGERY  01/ 23/2013   left central lumpectomy and snbx, re-excision lumpectomy   CARDIOVASCULAR STRESS TEST  03/17/2012   colon surgery     Small intestines- took 6 inches out   COLONOSCOPY  07/11/2015   [facility] Dr KDerrill Kay  ESOPHAGOGASTRODUODENOSCOPY  07/17/2010   [facility]   LEFT HEART CATHETERIZATION WITH CORONARY ANGIOGRAM N/A 03/18/2012   Procedure: LEFT HEART  CATHETERIZATION WITH CORONARY ANGIOGRAM;  Surgeon: JMinus Breeding MD;  [facility];  Service: Cardiovascular;  Laterality: N/A;   TONSILLECTOMY   age 87    Family History  Problem Relation Age of  Onset   Cancer Sister        unknown   Hypertension Mother    Stroke Mother    Arthritis Father    Heart disease Father    Diabetes Son    Colon cancer Neg Hx    Esophageal cancer Neg Hx      Social History   Socioeconomic History   Marital status: Married    Spouse name: Not on file   Number of children: Not on file   Years of education: Not on file   Highest education level: Not on file  Occupational History   Not on file  Tobacco Use   Smoking status: Former    Packs/day: 1.00    Years: 10.00    Pack years: 10.00    Types: Cigarettes    Quit date: 08/27/1977    Years since quitting: 43.9   Smokeless tobacco: Never  Vaping Use   Vaping Use: Never used  Substance and Sexual Activity   Alcohol use: Yes    Comment: rare   Drug use: No   Sexual activity: Yes    Birth control/protection: Post-menopausal  Other Topics Concern   Not on file  Social History Narrative   ** Merged History Encounter **       Social Determinants of Health   Financial Resource Strain: Not on file  Food Insecurity: Not on file  Transportation Needs: Not on file  Physical Activity: Not on file  Stress: Not on file  Social Connections: Not on file  Intimate Partner Violence: Not on file     BP 126/68   Pulse 77   Ht _0  (1.702 m)   Wt 124 lb (56.2 kg)   SpO2 99%   BMI 19.42 kg/m   Physical Exam:  Well appearing elderly woman, NAD HEENT: Unremarkable Neck:  6 cm JVD, no thyromegally Lymphatics:  No adenopathy Back:  No CVA tenderness Lungs:  Clear with no wheezes HEART:  Regular rate rhythm, no murmurs, no rubs, no clicks Abd:  soft, positive bowel sounds, no organomegally, no rebound, no guarding Ext:  2 plus pulses, no edema, no cyanosis, no clubbing Skin:   No rashes no nodules Neuro:  CN II through XII intact, motor grossly intact   Assess/Plan:  PAF - she is asymptomatic. She has continued to refuse anti-coagulation but does have hematuria. We will follow. Hematuria - workup with her MD in [place] is ongoing. She has a known right renal mass and I suspect she will need to consider removal. From a surgical risk she is an acceptable risk if surgery is needed. HTN - her bp is well controlled. We will follow.  Carleene Overlie Anyeli [place],MD

## 2021-07-15 ENCOUNTER — Telehealth: Payer: Self-pay | Admitting: Internal Medicine

## 2021-07-15 NOTE — Telephone Encounter (Signed)
[name] from [facility] calling in  Says clarification is needed for a MRI that is scheduled for patient  Please call [phone]

## 2021-07-16 DIAGNOSIS — R52 Pain, unspecified: Secondary | ICD-10-CM | POA: Diagnosis not present

## 2021-07-16 DIAGNOSIS — M4856XA Collapsed vertebra, not elsewhere classified, lumbar region, initial encounter for fracture: Secondary | ICD-10-CM | POA: Diagnosis not present

## 2021-07-18 NOTE — Telephone Encounter (Signed)
The MRI that I had ordered was canceled.  Someone else has ordered an MRI, but that was not me.

## 2021-07-24 NOTE — Telephone Encounter (Signed)
Called and spoke with [place] today.  Ref # [id number].  Informed them that we cancelled MRI and it was being ordered by another provider

## 2021-07-26 ENCOUNTER — Other Ambulatory Visit: Payer: Self-pay | Admitting: Internal Medicine

## 2021-07-30 ENCOUNTER — Other Ambulatory Visit: Payer: Self-pay | Admitting: Internal Medicine

## 2021-07-30 DIAGNOSIS — M5451 Vertebrogenic low back pain: Secondary | ICD-10-CM | POA: Diagnosis not present

## 2021-08-08 DIAGNOSIS — M545 Low back pain, unspecified: Secondary | ICD-10-CM | POA: Diagnosis not present

## 2021-08-08 DIAGNOSIS — G8929 Other chronic pain: Secondary | ICD-10-CM | POA: Diagnosis not present

## 2021-08-08 DIAGNOSIS — M4856XS Collapsed vertebra, not elsewhere classified, lumbar region, sequela of fracture: Secondary | ICD-10-CM | POA: Diagnosis not present

## 2021-08-08 DIAGNOSIS — M47816 Spondylosis without myelopathy or radiculopathy, lumbar region: Secondary | ICD-10-CM | POA: Diagnosis not present

## 2021-08-08 DIAGNOSIS — M2578 Osteophyte, vertebrae: Secondary | ICD-10-CM | POA: Diagnosis not present

## 2021-08-08 DIAGNOSIS — M549 Dorsalgia, unspecified: Secondary | ICD-10-CM | POA: Diagnosis not present

## 2021-08-12 NOTE — Telephone Encounter (Signed)
Patient informing provider she's still having periodic dark stool and lower back pain  Offered to transfer patient to team health due to dark stool, patient declined  Patient's next ov is 08-19-2021

## 2021-08-13 ENCOUNTER — Encounter (HOSPITAL_COMMUNITY): Payer: Self-pay

## 2021-08-13 ENCOUNTER — Ambulatory Visit (INDEPENDENT_AMBULATORY_CARE_PROVIDER_SITE_OTHER): Payer: Medicare PPO | Admitting: Internal Medicine

## 2021-08-13 ENCOUNTER — Emergency Department (HOSPITAL_COMMUNITY): Payer: Medicare PPO

## 2021-08-13 ENCOUNTER — Other Ambulatory Visit: Payer: Self-pay

## 2021-08-13 ENCOUNTER — Encounter: Payer: Self-pay | Admitting: Internal Medicine

## 2021-08-13 ENCOUNTER — Emergency Department (HOSPITAL_COMMUNITY)
Admission: EM | Admit: 2021-08-13 | Discharge: 2021-08-14 | Disposition: A | Discharge status: Home / Self Care | Payer: Medicare PPO | Attending: Emergency Medicine | Admitting: Emergency Medicine

## 2021-08-13 DIAGNOSIS — N289 Disorder of kidney and ureter, unspecified: Secondary | ICD-10-CM | POA: Diagnosis not present

## 2021-08-13 DIAGNOSIS — M545 Low back pain, unspecified: Secondary | ICD-10-CM | POA: Diagnosis not present

## 2021-08-13 DIAGNOSIS — I10 Essential (primary) hypertension: Secondary | ICD-10-CM | POA: Diagnosis not present

## 2021-08-13 DIAGNOSIS — Z79899 Other long term (current) drug therapy: Secondary | ICD-10-CM | POA: Diagnosis not present

## 2021-08-13 DIAGNOSIS — R109 Unspecified abdominal pain: Secondary | ICD-10-CM

## 2021-08-13 DIAGNOSIS — M4856XA Collapsed vertebra, not elsewhere classified, lumbar region, initial encounter for fracture: Secondary | ICD-10-CM | POA: Diagnosis not present

## 2021-08-13 DIAGNOSIS — S32010A Wedge compression fracture of first lumbar vertebra, initial encounter for closed fracture: Secondary | ICD-10-CM | POA: Diagnosis not present

## 2021-08-13 DIAGNOSIS — Z91040 Latex allergy status: Secondary | ICD-10-CM | POA: Diagnosis not present

## 2021-08-13 DIAGNOSIS — E039 Hypothyroidism, unspecified: Secondary | ICD-10-CM | POA: Diagnosis not present

## 2021-08-13 DIAGNOSIS — R1084 Generalized abdominal pain: Secondary | ICD-10-CM | POA: Diagnosis not present

## 2021-08-13 DIAGNOSIS — M4316 Spondylolisthesis, lumbar region: Secondary | ICD-10-CM | POA: Diagnosis not present

## 2021-08-13 DIAGNOSIS — X58XXXA Exposure to other specified factors, initial encounter: Secondary | ICD-10-CM | POA: Diagnosis not present

## 2021-08-13 DIAGNOSIS — E109 Type 1 diabetes mellitus without complications: Secondary | ICD-10-CM | POA: Diagnosis not present

## 2021-08-13 DIAGNOSIS — E876 Hypokalemia: Secondary | ICD-10-CM | POA: Diagnosis not present

## 2021-08-13 DIAGNOSIS — R197 Diarrhea, unspecified: Secondary | ICD-10-CM | POA: Diagnosis not present

## 2021-08-13 DIAGNOSIS — M549 Dorsalgia, unspecified: Secondary | ICD-10-CM | POA: Diagnosis not present

## 2021-08-13 DIAGNOSIS — K6389 Other specified diseases of intestine: Secondary | ICD-10-CM | POA: Diagnosis not present

## 2021-08-13 DIAGNOSIS — R1030 Lower abdominal pain, unspecified: Secondary | ICD-10-CM | POA: Diagnosis not present

## 2021-08-13 DIAGNOSIS — Z7982 Long term (current) use of aspirin: Secondary | ICD-10-CM | POA: Diagnosis not present

## 2021-08-13 DIAGNOSIS — K449 Diaphragmatic hernia without obstruction or gangrene: Secondary | ICD-10-CM | POA: Diagnosis not present

## 2021-08-13 DIAGNOSIS — S32020D Wedge compression fracture of second lumbar vertebra, subsequent encounter for fracture with routine healing: Secondary | ICD-10-CM | POA: Insufficient documentation

## 2021-08-13 DIAGNOSIS — Z794 Long term (current) use of insulin: Secondary | ICD-10-CM | POA: Diagnosis not present

## 2021-08-13 DIAGNOSIS — S34109A Unspecified injury to unspecified level of lumbar spinal cord, initial encounter: Secondary | ICD-10-CM | POA: Diagnosis present

## 2021-08-13 DIAGNOSIS — Z853 Personal history of malignant neoplasm of breast: Secondary | ICD-10-CM | POA: Diagnosis not present

## 2021-08-13 DIAGNOSIS — K8689 Other specified diseases of pancreas: Secondary | ICD-10-CM | POA: Diagnosis not present

## 2021-08-13 DIAGNOSIS — K921 Melena: Secondary | ICD-10-CM | POA: Insufficient documentation

## 2021-08-13 LAB — COMPREHENSIVE METABOLIC PANEL
ALT: 13 U/L (ref 0–44)
AST: 24 U/L (ref 15–41)
Albumin: 3.5 g/dL (ref 3.5–5.0)
Alkaline Phosphatase: 85 U/L (ref 38–126)
Anion gap: 9 (ref 5–15)
BUN: 9 mg/dL (ref 8–23)
CO2: 27 mmol/L (ref 22–32)
Calcium: 9.7 mg/dL (ref 8.9–10.3)
Chloride: 96 mmol/L — ABNORMAL LOW (ref 98–111)
Creatinine, Ser: 0.83 mg/dL (ref 0.44–1.00)
GFR, Estimated: >60 mL/min (ref >60)
Glucose, Bld: 87 mg/dL (ref 70–99)
Potassium: 2.9 mmol/L — ABNORMAL LOW (ref 3.5–5.1)
Sodium: 132 mmol/L — ABNORMAL LOW (ref 135–145)
Total Bilirubin: 0.8 mg/dL (ref 0.3–1.2)
Total Protein: 7.2 g/dL (ref 6.5–8.1)

## 2021-08-13 LAB — CBC WITH DIFFERENTIAL/PLATELET
Abs Immature Granulocytes: 0.02 10*3/uL (ref 0.00–0.07)
Basophils Absolute: 0 10*3/uL (ref 0.0–0.1)
Basophils Relative: 0 %
Eosinophils Absolute: 0 10*3/uL (ref 0.0–0.5)
Eosinophils Relative: 1 %
HCT: 38.7 % (ref 36.0–46.0)
Hemoglobin: 13 g/dL (ref 12.0–15.0)
Immature Granulocytes: 0 %
Lymphocytes Relative: 33 %
Lymphs Abs: 2.2 10*3/uL (ref 0.7–4.0)
MCH: 30.3 pg (ref 26.0–34.0)
MCHC: 33.6 g/dL (ref 30.0–36.0)
MCV: 90.2 fL (ref 80.0–100.0)
Monocytes Absolute: 0.9 10*3/uL (ref 0.1–1.0)
Monocytes Relative: 14 %
Neutro Abs: 3.5 10*3/uL (ref 1.7–7.7)
Neutrophils Relative %: 52 %
Platelets: 295 10*3/uL (ref 150–400)
RBC: 4.29 MIL/uL (ref 3.87–5.11)
RDW: 12.8 % (ref 11.5–15.5)
WBC: 6.7 10*3/uL (ref 4.0–10.5)
nRBC: 0 % (ref 0.0–0.2)

## 2021-08-13 LAB — LIPASE, BLOOD: Lipase: 20 U/L (ref 11–51)

## 2021-08-13 MED ORDER — IOHEXOL 350 MG/ML SOLN
75.0000 mL | Freq: Once | INTRAVENOUS | Status: AC | PRN
  Administered 2021-08-13: 75 mL via INTRAVENOUS

## 2021-08-13 MED ORDER — POTASSIUM CHLORIDE 10 MEQ/100ML IV SOLN
10.0000 meq | INTRAVENOUS | Status: AC
  Administered 2021-08-14 (×2): 10 meq via INTRAVENOUS
  Filled 2021-08-13 (×2): qty 100

## 2021-08-13 NOTE — Assessment & Plan Note (Signed)
Acute H/o of chronic constipation - now with LLQ pain, diarrhea, black stools Recent ileus Diarrhea may be related to constipation ? GI bleed ? Colitis ? Bladder retention Needs further evaluation - recommended ED evaluation

## 2021-08-13 NOTE — Progress Notes (Signed)
Subjective:    Patient ID: [name], female    DOB: March 06, 1929, 88 y.o.   MRN: [id number]  This visit occurred during the SARS-CoV-2 public health emergency.  Safety protocols were in place, including screening questions prior to the visit, additional usage of staff PPE, and extensive cleaning of exam room while observing appropriate contact time as indicated for disinfecting solutions.    HPI The patient is here for an acute visit.   Nov - back pain -  - went to ED and was diagnosed with renal mass, uti, urine retention and ileus.  Had foley placed. Saw urology - to have renal mass evaluated further.  Uti treatment  Back pain later determined to be from compression fracture.  Saw ortho -- was told the fracture was healing well - back brace no longer needed.  No procedure done.  Still has severe lower back pain.    Has ache in lower back that is constant and severe.  It takes her 45 min to get out of bed with the help from her husband.      12/30 ED - back xray, no uti   She has been having black stools, diarrhea, LLQ pain.  She thinks she has a uti.  Her back pain is severe.    Medications and allergies reviewed with patient and updated if appropriate.  Patient Active Problem List   Diagnosis Date Noted   Renal mass, right 06/30/2021   Lower back pain 06/30/2021   Acute cystitis 05/05/2021   Otitis media 04/22/2021   Otitis externa 04/22/2021   Strain of left trapezius muscle 04/22/2021   Anxiety 12/31/2020   Left leg swelling 03/05/2020   B12 deficiency 12/15/2019   Tingling in extremities 10/04/2019   Chronic thoracic back pain 08/16/2019   Atrial fibrillation ([place]) 02/01/2018   Leg cramps 07/05/2017   Constipation 01/25/2017   Closed fracture of proximal end of right humerus 07/09/2016   Cough 06/17/2016   Abdominal pain 03/10/2016   Hearing loss 01/03/2016   Allergic rhinitis 01/03/2016   Minimal Coronary Plaque by Cardiac Cath in 2013 09/11/2015    Frequent loose stools 08/28/2015   Hyponatremia    Acute mesenteric ischemia ([place])    Hypothyroidism 07/11/2012   Syncope 03/18/2012   Hypercholesteremia    Hypertension    Diabetes mellitus type 1 ([place])    Osteoarthritis    Thyroid nodule    Osteoporosis, post-menopausal    Primary cancer of upper outer quadrant of left female breast ([place]) 08/21/2011    Current Outpatient Medications on File Prior to Visit  Medication Sig Dispense Refill   acetic acid 2 % otic solution Place 4 drops into both ears every 3 (three) hours. 15 mL 0   ALPRAZolam (XANAX) 0.5 MG tablet TAKE 1/2 TABLET TWICE DAILY AS NEEDED FOR ANXIETY OR SLEEP 30 tablet 5   aspirin EC 81 MG tablet Take 81 mg by mouth daily.     B Complex-C (SUPER B COMPLEX PO) Take 1 tablet by mouth daily.     BD VEO INSULIN SYRINGE U/F 31G X 15/64" 0.3 ML MISC USE WITH INJECTIONS 100 each 2   bisacodyl (DULCOLAX) 10 MG suppository Place 1 suppository (10 mg total) rectally as needed for moderate constipation. 12 suppository 0   carboxymethylcellulose (REFRESH PLUS) 0.5 % SOLN Place 1 drop into both eyes 3 (three) times daily as needed. Non-preservative     cholecalciferol (VITAMIN D) 1000 UNITS tablet Take 5,000 Units by mouth  daily.      Continuous Blood Gluc Sensor ([place]) MISC by Does not apply route. Sensor is located in Right arm     DUREZOL 0.05 % EMUL Place 1 drop into the right eye 3 (three) times daily.     fish oil-omega-3 fatty acids 1000 MG capsule Take 2 g by mouth daily.     glucose blood test strip OneTouch Ultra Blue Test Strip     hydrochlorothiazide (HYDRODIURIL) 25 MG tablet TAKE (1) TABLET DAILY AS NEEDED. 90 tablet 0   insulin glargine (LANTUS) 100 UNIT/ML Solostar Pen Inject 14 Units into the skin daily at 10 pm.      insulin lispro (HUMALOG) 100 UNIT/ML KiwkPen Inject into the skin. Sliding scale before meal     Insulin Pen Needle (BD PEN NEEDLE NANO 2ND GEN) 32G X 4 MM MISC INJECT AS DIRECTED  FOUR TIMES A DAY     levothyroxine (SYNTHROID) 112 MCG tablet      losartan (COZAAR) 100 MG tablet TAKE 1 TABLET ONCE DAILY. 90 tablet 0   methocarbamol (ROBAXIN) 500 MG tablet Take 1 tablet (500 mg total) by mouth at bedtime as needed for muscle spasms. 30 tablet 5   metoprolol succinate (TOPROL-XL) 25 MG 24 hr tablet TAKE 1/2 TABLET AS NEEDED FOR PALPITATIONS. 45 tablet 3   moxifloxacin (VIGAMOX) 0.5 % ophthalmic solution Place 1 drop into the right eye 4 (four) times daily.     Multiple Vitamin (MULTIVITAMIN) tablet Take 1 tablet by mouth daily. For breast and bone health     nitrofurantoin, macrocrystal-monohydrate, (MACROBID) 100 MG capsule Take 1 capsule (100 mg total) by mouth 2 (two) times daily. 14 capsule 0   ondansetron (ZOFRAN) 4 MG tablet Take 1 tablet (4 mg total) by mouth every 8 (eight) hours as needed for nausea or vomiting. 20 tablet 0   prednisoLONE acetate (PRED FORTE) 1 % ophthalmic suspension prednisolone acetate 1 % eye drops,suspension  place 1 drop into the affected eye 4 times a day for 5 days.     saccharomyces boulardii (FLORASTOR) 250 MG capsule Take 1 capsule (250 mg total) by mouth 2 (two) times daily. 60 capsule 5   simvastatin (ZOCOR) 20 MG tablet TAKE ONE TABLET AT BEDTIME. 90 tablet 1   insulin aspart (NOVOLOG FLEXPEN) 100 UNIT/ML FlexPen Novolog FlexPen U-100 Insulin aspart 100 unit/mL (3 mL) subcutaneous     metoCLOPramide (REGLAN) 10 MG tablet Take 1 tablet (10 mg total) by mouth every 6 (six) hours for 7 days. 28 tablet 0   No current facility-administered medications on file prior to visit.    Past Medical History:  Diagnosis Date   Anxiety    Arthritis    fingers   Bowel obstruction ([place])    Cancer of upper-outer quadrant of female breast ([place]) 08/21/2011   ER +  PR +  Her 2 -  Ki67 9%   0.9 cm invasive lobular  Carcinoma ,s/p central lumpectomy with sentinel node biopsy,  ER/PR positive s/p re-excion on 09/16/11 with final pathology showing atypical  hyperplasia    CAP (community acquired pneumonia) 12/06/2016   Diabetes mellitus type 1 ([place])    type I- wears insulin pump   GERD (gastroesophageal reflux disease)    History of small bowel obstruction    Hypercholesteremia    Hypergastrinemia    Hypertension    Hypertension    Hyponatremia    Hypothyroid    Hypothyroidism    IBS (irritable bowel syndrome)  Osteoarthritis    Osteoporosis, post-menopausal    DEXA 12/02/11: -3.5 (with lomax gyn), declines bisphos due to bone pain   PAF (paroxysmal atrial fibrillation) ([place])    One episode in 2008, spontaneously converted in the ED, no further treatment   Spondylosis    thoracic spine   Thyroid nodule dx 07/2011   [place] q 55moto follow calcified L thyroid nodule (48[place]   Umbilical hernia     Past Surgical History:  Procedure Laterality Date   ABDOMINAL HYSTERECTOMY     ABDOMINAL HYSTERECTOMY  yrs ago   APPENDECTOMY     APPENDECTOMY  age 88  BLADDER REPAIR  5 yrs ago   BREAST LUMPECTOMY     BREAST SURGERY     biopsy   BREAST SURGERY   yrs ago   br bx   BREAST SURGERY  01/ 23/2013   left central lumpectomy and snbx, re-excision lumpectomy   CARDIOVASCULAR STRESS TEST  03/17/2012   colon surgery     Small intestines- took 6 inches out   COLONOSCOPY  07/11/2015   [facility] KDerrill Kay  ESOPHAGOGASTRODUODENOSCOPY  07/17/2010   [facility]   LEFT HEART CATHETERIZATION WITH CORONARY ANGIOGRAM N/A 03/18/2012   Procedure: LEFT HEART CATHETERIZATION WITH CORONARY ANGIOGRAM;  Surgeon: JMinus Breeding MD;  [facility];  Service: Cardiovascular;  Laterality: N/A;   TONSILLECTOMY   age 88   Social History   Socioeconomic History   Marital status: Married    Spouse name: Not on file   Number of children: Not on file   Years of education: Not on file   Highest education level: Not on file  Occupational History   Not on file  Tobacco Use   Smoking status: Former    Packs/day: 1.00    Years: 10.00    Pack  years: 10.00    Types: Cigarettes    Quit date: 08/27/1977    Years since quitting: 43.9   Smokeless tobacco: Never  Vaping Use   Vaping Use: Never used  Substance and Sexual Activity   Alcohol use: Yes    Comment: rare   Drug use: No   Sexual activity: Yes    Birth control/protection: Post-menopausal  Other Topics Concern   Not on file  Social History Narrative   ** Merged History Encounter **       Social Determinants of Health   Financial Resource Strain: Not on file  Food Insecurity: Not on file  Transportation Needs: Not on file  Physical Activity: Not on file  Stress: Not on file  Social Connections: Not on file    Family History  Problem Relation Age of Onset   Cancer Sister        unknown   Hypertension Mother    Stroke Mother    Arthritis Father    Heart disease Father    Diabetes Son    Colon cancer Neg Hx    Esophageal cancer Neg Hx     Review of Systems  Constitutional:  Positive for appetite change and chills. Negative for fever.  Respiratory:  Positive for shortness of breath. Negative for cough and wheezing.   Cardiovascular:  Positive for chest pain and palpitations. Negative for leg swelling.  Gastrointestinal:  Positive for abdominal pain (LLQ), diarrhea and nausea. Negative for blood in stool (black stool).  Genitourinary:  Positive for dysuria. Negative for hematuria.  Musculoskeletal:  Positive for back pain.  Neurological:  Positive for headaches.  Negative for light-headedness.      Objective:   Vitals:   08/13/21 1533  BP: (!) 146/84  Pulse: 78  Temp: 98.5 F (36.9 C)  SpO2: 98%   BP Readings from Last 3 Encounters:  08/13/21 (!) 146/84  07/14/21 126/68  06/30/21 130/74   Wt Readings from Last 3 Encounters:  07/14/21 124 lb (56.2 kg)  06/30/21 129 lb (58.5 kg)  06/23/21 129 lb (58.5 kg)   There is no height or weight on file to calculate BMI.   Physical Exam Constitutional:      General: She is not in acute distress.     Appearance: Normal appearance. She is ill-appearing (mild - in pain). She is not toxic-appearing or diaphoretic.  HENT:     Head: Normocephalic and atraumatic.  Cardiovascular:     Rate and Rhythm: Normal rate and regular rhythm.  Pulmonary:     Effort: Pulmonary effort is normal. No respiratory distress.     Breath sounds: No wheezing or rales.  Abdominal:     General: There is distension.     Palpations: Abdomen is soft.     Tenderness: There is abdominal tenderness (LLQ, suprapubic region, mildly diffuse). There is no guarding or rebound.  Musculoskeletal:        General: Tenderness (lower back  - spine with palpation, significant pain with an movement) present.  Skin:    General: Skin is warm and dry.  Neurological:     Mental Status: She is alert.       Lab Results  Component Value Date   WBC 6.7 08/13/2021   HGB 13.0 08/13/2021   HCT 38.7 08/13/2021   PLT 295 08/13/2021   GLUCOSE 249 (H) 06/23/2021   CHOL 169 10/04/2019   TRIG 99.0 10/04/2019   HDL 54.50 10/04/2019   LDLCALC 95 10/04/2019   ALT 14 06/23/2021   AST 25 06/23/2021   NA 129 (L) 06/23/2021   K 3.2 (L) 06/23/2021   CL 94 (L) 06/23/2021   CREATININE 0.81 06/23/2021   BUN 13 06/23/2021   CO2 25 06/23/2021   TSH 11.786 (H) 06/23/2021   INR 1.16 03/18/2012   HGBA1C 7.8 (H) 07/16/2020    DG Lumbar Spine Complete CLINICAL DATA:  Flank pain.  EXAM: LUMBAR SPINE - COMPLETE 4+ VIEW  COMPARISON:  June 30, 2021.  FINDINGS: There appears to be mildly increased compression deformity of L2 vertebral body concerning for subacute fracture. No significant spondylolisthesis is noted. Moderate degenerative disc disease is noted at L4-5.  IMPRESSION: Possible mildly increased compression deformity of L2 vertebral body concerning for subacute fracture. MRI may be performed for further evaluation.  Aortic Atherosclerosis (ICD10-I70.0).  Electronically Signed   By: Marijo Conception M.D.   On:  07/14/2021 14:14 DG Thoracic Spine 2 View CLINICAL DATA:  Flank pain.  EXAM: THORACIC SPINE 2 VIEWS  COMPARISON:  August 16, 2019.  FINDINGS: There is interval development of moderate compression deformity involving midthoracic vertebral body concerning for fracture of indeterminate age. No spondylolisthesis is noted.  IMPRESSION: Interval development of moderate compression deformity involving midthoracic vertebral body concerning for fracture of indeterminate age. MRI may be performed for further evaluation.  Electronically Signed   By: Marijo Conception M.D.   On: 07/14/2021 14:11      Assessment & Plan:     I spent 30 minutes dedicated to the care of this patient on the date of this encounter including review of recent labs, imaging and procedures,  speciality notes, obtaining history, communicating with the patient and documenting clinical information in the EHR   See Problem List for Assessment and Plan of chronic medical problems.

## 2021-08-13 NOTE — ED Triage Notes (Addendum)
Pt BIB [facility] from the drs office c/o lower back pain, lower left abdominal pain, stool incontinence and dark stools. Pt states pain is 10/10. Pt also states this has been an ongoing issue since Nov.

## 2021-08-13 NOTE — Assessment & Plan Note (Signed)
Subacute Start in November and has not gotten better Has seen ortho - compression fx  Healing per ortho, but pain is severe and she is not able to function Needs further evaluation, pain management that is not appropriate for outpatient management Advised ED evaluation

## 2021-08-13 NOTE — ED Provider Triage Note (Signed)
Emergency Medicine Provider Triage Evaluation Note  [name] , a 88 y.o. female  was evaluated in triage.  Pt complains of lower abdominal pain and back pain. Admits to dark stool recently. Patient sent to ED by PCP. Admits to stool incontinence.   Review of Systems  Positive: Abdominal pain, back pain Negative: CP  Physical Exam  There were no vitals taken for this visit. Gen:   Awake, no distress   Resp:  Normal effort  MSK:   Moves extremities without difficulty  Other:    Medical Decision Making  Medically screening exam initiated at 5:13 PM.  Appropriate orders placed.  [name] was informed that the remainder of the evaluation will be completed by another provider, this initial triage assessment does not replace that evaluation, and the importance of remaining in the ED until their evaluation is complete.  Labs CT abdomen/lumbar spine   Suzy Bouchard, PA-C 08/13/21 1719

## 2021-08-13 NOTE — Assessment & Plan Note (Signed)
Acute Concern for GI bleed With other complaints ED evaluation recommended

## 2021-08-14 LAB — URINALYSIS, ROUTINE W REFLEX MICROSCOPIC
Bilirubin Urine: NEGATIVE
Glucose, UA: NEGATIVE mg/dL
Ketones, ur: NEGATIVE mg/dL
Nitrite: NEGATIVE
Protein, ur: NEGATIVE mg/dL
Specific Gravity, Urine: 1.025 (ref 1.005–1.030)
pH: 6 (ref 5.0–8.0)

## 2021-08-14 LAB — POC OCCULT BLOOD, ED: Fecal Occult Bld: POSITIVE — AB

## 2021-08-14 MED ORDER — POTASSIUM CHLORIDE CRYS ER 20 MEQ PO TBCR: 20.0000 meq | EXTENDED_RELEASE_TABLET | Freq: Two times a day (BID) | ORAL | 0 refills | Status: DC

## 2021-08-14 MED ORDER — OXYCODONE-ACETAMINOPHEN 5-325 MG PO TABS
1.0000 | ORAL_TABLET | Freq: Once | ORAL | Status: AC
  Administered 2021-08-14: 1 via ORAL
  Filled 2021-08-14: qty 1

## 2021-08-14 NOTE — ED Provider Notes (Signed)
[facility] DEPART[facility] Provider Note   CSN: [id number] Arrival date & time: 08/13/21  1713     History  Chief Complaint  Patient presents with   Abdominal Pain    [name] is a 88 y.o. female.  HPI     This is a 88 year old female with a history of hypothyroidism, hypertension, diabetes, paroxysmal atrial fibrillation, IBS who presents with multiple complaints.  Patient reports back pain and abdominal pain.  Those are subacute in nature.  Patient reports she has had ongoing back pain since the end of November.  She has a known L2 fracture for which she is seeing Dr. Maxie Better.  She states she continues to have pain.  She followed up in the office and was told that she did not need to use her brace anymore.  She has not had any lower extremity weakness or bowel or bladder incontinence.  Patient reports ongoing daily diarrhea.  She states that it is black and has been black for some time.  She is not noted bright red blood.  She has nausea and decreased appetite and has had weight loss since November.  She saw her primary physician today who referred her to the emergency room for her multiple complaints.  She denies any dysuria but states she does have history of kidney infections.  Chart reviewed from yesterday from PCP office.  No work-up initiated.  She was referred to the ED.  Possible concern for GI bleed versus colitis.  Also concern for ongoing pain.  Additionally, patient was seen on 12/30 at outside hospital for back pain.  She was referred back to her orthopedist.  Home Medications Prior to Admission medications   Medication Sig Start Date End Date Taking? Authorizing Provider  potassium chloride SA (KLOR-CON M) 20 MEQ tablet Take 1 tablet (20 mEq total) by mouth 2 (two) times daily. 08/14/21  Yes Kiyra Slaubaugh, Barbette Hair, MD  acetic acid 2 % otic solution Place 4 drops into both ears every 3 (three) hours. 04/22/21   Binnie Rail, MD  ALPRAZolam Duanne Moron) 0.5  MG tablet TAKE 1/2 TABLET TWICE DAILY AS NEEDED FOR ANXIETY OR SLEEP 06/30/21   Binnie Rail, MD  aspirin EC 81 MG tablet Take 81 mg by mouth daily.    [provider]  B Complex-C (SUPER B COMPLEX PO) Take 1 tablet by mouth daily.    [provider]  BD VEO INSULIN SYRINGE U/F 31G X 15/64" 0.3 ML MISC USE WITH INJECTIONS 07/28/21   Burns, Claudina Lick, MD  bisacodyl (DULCOLAX) 10 MG suppository Place 1 suppository (10 mg total) rectally as needed for moderate constipation. 04/27/19   Binnie Rail, MD  carboxymethylcellulose (REFRESH PLUS) 0.5 % SOLN Place 1 drop into both eyes 3 (three) times daily as needed. Non-preservative    [provider]  cholecalciferol (VITAMIN D) 1000 UNITS tablet Take 5,000 Units by mouth daily.     [provider]  Continuous Blood Gluc Sensor ([place]) MISC by Does not apply route. Sensor is located in Right arm    [provider]  DUREZOL 0.05 % EMUL Place 1 drop into the right eye 3 (three) times daily. 04/01/21   [provider]  fish oil-omega-3 fatty acids 1000 MG capsule Take 2 g by mouth daily.    [provider]  glucose blood test strip OneTouch Ultra Blue Test Strip    [provider]  hydrochlorothiazide (HYDRODIURIL) 25 MG  tablet TAKE (1) TABLET DAILY AS NEEDED. 07/30/21   Burns, Claudina Lick, MD  insulin aspart (NOVOLOG FLEXPEN) 100 UNIT/ML FlexPen Novolog FlexPen U-100 Insulin aspart 100 unit/mL (3 mL) subcutaneous    [provider]  insulin glargine (LANTUS) 100 UNIT/ML Solostar Pen Inject 14 Units into the skin daily at 10 pm.  07/29/14   [provider]  insulin lispro (HUMALOG) 100 UNIT/ML KiwkPen Inject into the skin. Sliding scale before meal    [provider]  Insulin Pen Needle (BD PEN NEEDLE NANO 2ND GEN) 32G X 4 MM MISC INJECT AS DIRECTED FOUR TIMES A DAY 08/01/19   [provider]  levothyroxine (SYNTHROID) 112 MCG tablet   03/22/20   [provider]  losartan (COZAAR) 100 MG tablet TAKE 1 TABLET ONCE DAILY. 03/20/21   Binnie Rail, MD  methocarbamol (ROBAXIN) 500 MG tablet Take 1 tablet (500 mg total) by mouth at bedtime as needed for muscle spasms. 04/22/21   Binnie Rail, MD  metoCLOPramide (REGLAN) 10 MG tablet Take 1 tablet (10 mg total) by mouth every 6 (six) hours for 7 days. 06/23/21 06/30/21  Loeffler, Adora Fridge, PA-C  metoprolol succinate (TOPROL-XL) 25 MG 24 hr tablet TAKE 1/2 TABLET AS NEEDED FOR PALPITATIONS. 02/21/21   Evans Lance, MD  moxifloxacin (VIGAMOX) 0.5 % ophthalmic solution Place 1 drop into the right eye 4 (four) times daily. 04/01/21   [provider]  Multiple Vitamin (MULTIVITAMIN) tablet Take 1 tablet by mouth daily. For breast and bone health    [provider]  nitrofurantoin, macrocrystal-monohydrate, (MACROBID) 100 MG capsule Take 1 capsule (100 mg total) by mouth 2 (two) times daily. 06/30/21   Binnie Rail, MD  ondansetron (ZOFRAN) 4 MG tablet Take 1 tablet (4 mg total) by mouth every 8 (eight) hours as needed for nausea or vomiting. 06/30/21   Binnie Rail, MD  prednisoLONE acetate (PRED FORTE) 1 % ophthalmic suspension prednisolone acetate 1 % eye drops,suspension  place 1 drop into the affected eye 4 times a day for 5 days.    [provider]  saccharomyces boulardii (FLORASTOR) 250 MG capsule Take 1 capsule (250 mg total) by mouth 2 (two) times daily. 04/04/19   Binnie Rail, MD  simvastatin (ZOCOR) 20 MG tablet TAKE ONE TABLET AT BEDTIME. 11/27/20   Binnie Rail, MD      Allergies    Eliquis [apixaban], Amlodipine, Augmentin [amoxicillin-pot clavulanate], Cortisone, Keflex [cephalexin], Omeprazole, Prednisone, Hydrocodone, and Latex    Review of Systems   Review of Systems  Physical Exam Updated Vital Signs BP (!) 183/78    Pulse 82    Temp 98.3 F (36.8 C) (Oral)    Resp 20    Ht 1.702 m ($Remove'5\' 7"'NGrHOXk$ )    Wt 54 kg    SpO2 93%    BMI  18.64 kg/m  Physical Exam Vitals and nursing note reviewed.  Constitutional:      Appearance: She is well-developed. She is not ill-appearing.     Comments: Elderly, nontoxic-appearing  HENT:     Head: Normocephalic and atraumatic.     Mouth/Throat:     Mouth: Mucous membranes are moist.  Eyes:     Pupils: Pupils are equal, round, and reactive to light.  Cardiovascular:     Rate and Rhythm: Normal rate and regular rhythm.     Heart sounds: Normal heart sounds.  Pulmonary:     Effort: Pulmonary effort is normal. No respiratory distress.  Breath sounds: No wheezing.  Abdominal:     General: Bowel sounds are normal.     Palpations: Abdomen is soft.     Tenderness: There is no right CVA tenderness or left CVA tenderness.  Genitourinary:    Comments: Deferred as patient is in the hallway Musculoskeletal:     Cervical back: Neck supple.     Comments: Lower lumbar midline tenderness palpation, no step-off or deformity noted  Skin:    General: Skin is warm and dry.  Neurological:     Mental Status: She is alert and oriented to person, place, and time.     Comments: 5 out of 5 strength bilateral lower extremity  Psychiatric:        Mood and Affect: Mood normal.    ED Results / Procedures / Treatments   Labs (all labs ordered are listed, but only abnormal results are displayed) Labs Reviewed  COMPREHENSIVE METABOLIC PANEL - Abnormal; Notable for the following components:      Result Value   Sodium 132 (*)    Potassium 2.9 (*)    Chloride 96 (*)    All other components within normal limits  URINALYSIS, ROUTINE W REFLEX MICROSCOPIC - Abnormal; Notable for the following components:   Hgb urine dipstick SMALL (*)    Leukocytes,Ua TRACE (*)    Bacteria, UA RARE (*)    All other components within normal limits  POC OCCULT BLOOD, ED - Abnormal; Notable for the following components:   Fecal Occult Bld POSITIVE (*)    All other components within normal limits  C DIFFICILE QUICK  SCREEN W PCR REFLEX    GASTROINTESTINAL PANEL BY PCR, STOOL (REPLACES STOOL CULTURE)  URINE CULTURE  CBC WITH DIFFERENTIAL/PLATELET  LIPASE, BLOOD    EKG None  Radiology CT ABDOMEN PELVIS W CONTRAST  Result Date: 08/13/2021 CLINICAL DATA:  Bowel obstruction suspected EXAM: CT ABDOMEN AND PELVIS WITH CONTRAST TECHNIQUE: Multidetector CT imaging of the abdomen and pelvis was performed using the standard protocol following bolus administration of intravenous contrast. CONTRAST:  77mL OMNIPAQUE IOHEXOL 350 MG/ML SOLN COMPARISON:  X-ray lumbar spine 07/14/2021, CT renal 06/23/2021 FINDINGS: Lower chest: No acute abnormality.  Tiny hiatal hernia. Hepatobiliary: No focal liver abnormality. No gallstones, gallbladder wall thickening, or pericholecystic fluid. No biliary dilatation. Pancreas: Diffusely atrophic. No focal lesion. Otherwise normal pancreatic contour. No surrounding inflammatory changes. No main pancreatic ductal dilatation. Spleen: Normal in size without focal abnormality. Adrenals/Urinary Tract: No adrenal nodule bilaterally. Bilateral kidneys enhance symmetrically. Poorly defined hypodensity of the renal parenchyma along the superior pole and interpolar region. No hydronephrosis. No hydroureter. The urinary bladder is unremarkable. Stomach/Bowel: Stomach is within normal limits. No evidence of small bowel wall thickening or dilatation. Fluid within the lumen of the large bowel. No large bowel dilatation. The appendix is not definitely identified with no inflammatory changes in the right lower quadrant to suggest acute appendicitis. Vascular/Lymphatic: No abdominal aorta or iliac aneurysm. Severe atherosclerotic plaque of the aorta and its branches. No abdominal, pelvic, or inguinal lymphadenopathy. Reproductive: Status post hysterectomy. No adnexal masses. Other: No intraperitoneal free fluid. No intraperitoneal free gas. No organized fluid collection. Musculoskeletal: No abdominal wall hernia or  abnormality. No suspicious lytic or blastic osseous lesions. Please see separately dictated CT lumbar spine 08/13/2021. IMPRESSION: 1. Right renal findings suggestive of focal pyelonephritis. Correlate with urinalysis. 2. Fast transition state of the colon. 3. Interval development of an acute L2 inferior endplate compression fracture with at least 25% height loss. 4.  Aortic Atherosclerosis (ICD10-I70.0). 5. Small hilar hernia. 6. Please see separately dictated CT lumbar spine 08/13/2021. Electronically Signed   By: Iven Finn M.D.   On: 08/13/2021 21:53   CT L-SPINE NO CHARGE  Result Date: 08/13/2021 CLINICAL DATA:  lower back pain, lower left abdominal pain, stool incontinence and dark stools. EXAM: CT LUMBAR SPINE WITHOUT CONTRAST TECHNIQUE: Multidetector CT imaging of the lumbar spine was performed without intravenous contrast administration. Multiplanar CT image reconstructions were also generated. COMPARISON:  X-ray lumbar spine 07/14/2021, CT renal 06/23/2021 FINDINGS: Segmentation: 5 lumbar type vertebrae. Alignment: Grade 1 anterolisthesis of L3 on L4. Grade 1 anterolisthesis of L4 on L5. Vertebrae: No acute fracture or focal pathologic process. Interval development of an acute L1 compression fracture with less than 5% height loss. Redemonstration of an acute subacute L2 inferior endplate compression fracture with at least 25% height loss. Grade 1 anterolisthesis of L3 on L4 and L4 on L5. Paraspinal and other soft tissues: Negative. Disc levels: Intervertebral disc space vacuum phenomenon at the L2-L3, L4-L5, L5-S1 levels. IMPRESSION: 1. Interval development of an acute L1 compression fracture with less than 5% height loss. 2. Similar appear acute to subacute L2 inferior endplate compression fracture with at least 25% height loss. 3. Grade 1 anterolisthesis of L3 on L4 and L4 on L5. 4.  Aortic Atherosclerosis (ICD10-I70.0). 5. Please see separately dictated CT abdomen pelvis 08/13/2021.  Electronically Signed   By: Iven Finn M.D.   On: 08/13/2021 21:57    Procedures .Critical Care Performed by: Merryl Hacker, MD Authorized by: Merryl Hacker, MD   Critical care provider statement:    Critical care time (minutes):  30   Critical care was necessary to treat or prevent imminent or life-threatening deterioration of the following conditions: Hypokalemia.   Critical care was time spent personally by me on the following activities:  Development of treatment plan with patient or surrogate, discussions with consultants, evaluation of patient's response to treatment, examination of patient, ordering and review of laboratory studies, ordering and review of radiographic studies, ordering and performing treatments and interventions, pulse oximetry, re-evaluation of patient's condition and review of old charts    Medications Ordered in ED Medications  potassium chloride 10 mEq in 100 mL IVPB (10 mEq Intravenous New Bag/Given 08/14/21 0226)  iohexol (OMNIPAQUE) 350 MG/ML injection 75 mL (75 mLs Intravenous Contrast Given 08/13/21 2111)  oxyCODONE-acetaminophen (PERCOCET/ROXICET) 5-325 MG per tablet 1 tablet (1 tablet Oral Given 08/14/21 0123)    ED Course/ Medical Decision Making/ A&P                           Medical Decision Making  This patient presents to the ED for concern of multiple complaints, this involves an extensive number of treatment options, and is a complaint that carries with it a high risk of complications and morbidity.  The differential diagnosis includes UTI, ongoing compression fracture pain, pain related to renal lesion known, infection.  MDM:    This a 87 year old female who presents with multiple complaints.  Complaints appear subacute to acute on chronic.  She has had difficulty with her bowels since November and has had worsening back pain with a known compression fracture.  She saw her primary physician yesterday who referred her to the ED for  further evaluation.  She is overall nontoxic.  Vital signs notable for blood pressure 154/58.  She is in no acute distress.  Labs obtained.  Labs show  a potassium of 2.9.  This was replaced.  May be related to ongoing diarrhea.  She is unable to provide a stool sample.  On rectal exam, she had dark stools that were heme positive.  No gross blood.  Hemoglobin is normal.  Additionally, CT scan obtained and shows no evidence of colitis or intramural thickening.  She does have some abnormalities concerning for possible pyelonephritis in the right kidney; however, urinalysis is not consistent with UTI and patient does not have any urinary symptoms at this time.  She does have a known right renal lesion which appears to correspond to the abnormalities noted on today's CT scan.  Suspect that this is related.  CT also shows persistent L2 compression fracture which is known.  She also has a small 5% height loss of L1 which appears to be new.  This may be contributing to her worsening pain.  Recommend that she replace her TLSO.  She is not currently taking prescribed pain medications.  Discussed with her that she may need to increase her pain regimen at home as previously recommended.  She has no signs or symptoms at this time of cauda equina.  She is 88 years old and has had difficulty doing her ADLs.  We will place a home health consult.  Feel she is stable for outpatient follow-up with urology, gastroenterology, primary care. (Labs, imaging)  Labs: I Ordered, and personally interpreted labs.  The pertinent results include:  K 2.9   Imaging Studies ordered: I ordered imaging studies including CT I independently visualized and interpreted imaging. I agree with the radiologist interpretation  Critical Interventions: IV potassium   Consultations Obtained: I requested consultation with the n/a,  and discussed lab and imaging findings as well as pertinent plan - they recommend: n/a  Reevaluation: After the  interventions noted above, I reevaluated the patient and found that they have :improved  Social Determinants of Health: advanced age  Disposition:  Home  Co morbidities that complicate the patient evaluation  Past Medical History:  Diagnosis Date   Anxiety    Arthritis    fingers   Bowel obstruction ([place])    Cancer of upper-outer quadrant of female breast ([place]) 08/21/2011   ER +  PR +  Her 2 -  Ki67 9%   0.9 cm invasive lobular  Carcinoma ,s/p central lumpectomy with sentinel node biopsy,  ER/PR positive s/p re-excion on 09/16/11 with final pathology showing atypical hyperplasia    CAP (community acquired pneumonia) 12/06/2016   Diabetes mellitus type 1 ([place])    type I- wears insulin pump   GERD (gastroesophageal reflux disease)    History of small bowel obstruction    Hypercholesteremia    Hypergastrinemia    Hypertension    Hypertension    Hyponatremia    Hypothyroid    Hypothyroidism    IBS (irritable bowel syndrome)    Osteoarthritis    Osteoporosis, post-menopausal    DEXA 12/02/11: -3.5 (with lomax gyn), declines bisphos due to bone pain   PAF (paroxysmal atrial fibrillation) ([place])    One episode in 2008, spontaneously converted in the ED, no further treatment   Spondylosis    thoracic spine   Thyroid nodule dx 07/2011   [place] q 76mo to follow calcified L thyroid nodule (3/73/42 [place])   Umbilical hernia       Additional history obtained from chart review External records from outside source obtained and reviewed including ED visit last week at OSH   Cardiac Monitoring:  The patient was maintained on a cardiac monitor.  I personally viewed and interpreted the cardiac monitored which showed an underlying rhythm of: n/a   Medicines  Meds ordered this encounter  Medications   iohexol (OMNIPAQUE) 350 MG/ML injection 75 mL   potassium chloride 10 mEq in 100 mL IVPB   oxyCODONE-acetaminophen (PERCOCET/ROXICET) 5-325 MG per tablet 1 tablet   potassium chloride SA  (KLOR-CON M) 20 MEQ tablet    Sig: Take 1 tablet (20 mEq total) by mouth 2 (two) times daily.    Dispense:  10 tablet    Refill:  0     I have reviewed the patients home medicines and have made adjustments as needed   Problem List / ED Course: Problem List Items Addressed This Visit       Other   Frequent loose stools   Other Visit Diagnoses     Compression fracture of L2 vertebra with routine healing, subsequent encounter    -  Primary   Compression fracture of L1 vertebra, initial encounter ([place])       Renal lesion       Hypokalemia                       Final Clinical Impression(s) / ED Diagnoses Final diagnoses:  Compression fracture of L2 vertebra with routine healing, subsequent encounter  Compression fracture of L1 vertebra, initial encounter ([place])  Diarrhea, unspecified type  Renal lesion  Hypokalemia    Rx / DC Orders ED Discharge Orders          [place]        08/14/21 0223    Face-to-face encounter (required for Medicare/Medicaid patients)       Comments: I Barbette Hair Manpreet Strey certify that this patient is under my care and that I, or a nurse practitioner or physician's assistant working with me, had a face-to-face encounter that meets the physician face-to-face encounter requirements with this patient on 08/14/2021. The encounter with the patient was in whole, or in part for the following medical condition(s) which is the primary reason for home health care (List medical condition): compression fractures   08/14/21 0223    potassium chloride SA (KLOR-CON M) 20 MEQ tablet  2 times daily        08/14/21 0227              Merryl Hacker, MD 08/14/21 [phone]

## 2021-08-14 NOTE — Discharge Instructions (Addendum)
You are seen today for ongoing back pain and diarrhea.  Your CT scans revealed your known L2 fracture.  You also have a small compression deformity of L1 which does appear new.  It is 5% height loss.  Given your ongoing pain, I recommend using your brace.  You may need to take pain medications as previously prescribed.  Follow-up with orthopedics.  Regarding her diarrhea.  It is heme positive but your hemoglobin is stable.  You need to follow-up with gastroenterology.  Given your history of IBS, this could be related.  Avoid anti-inflammatory medications.  You have a persistent abnormality of your right kidney.  It is important you follow-up with urology as previously recommended.

## 2021-08-14 NOTE — ED Notes (Signed)
Pt placed on cardiac monitor 

## 2021-08-15 LAB — URINE CULTURE: Culture: <10000 — AB

## 2021-08-18 ENCOUNTER — Encounter: Payer: Self-pay | Admitting: Internal Medicine

## 2021-08-18 DIAGNOSIS — S32020A Wedge compression fracture of second lumbar vertebra, initial encounter for closed fracture: Secondary | ICD-10-CM | POA: Insufficient documentation

## 2021-08-18 DIAGNOSIS — S32010A Wedge compression fracture of first lumbar vertebra, initial encounter for closed fracture: Secondary | ICD-10-CM | POA: Insufficient documentation

## 2021-08-18 NOTE — Progress Notes (Signed)
Subjective:    Patient ID: [name], female    DOB: 1928/10/27, 88 y.o.   MRN: [id number]  This visit occurred during the SARS-CoV-2 public health emergency.  Safety protocols were in place, including screening questions prior to the visit, additional usage of staff PPE, and extensive cleaning of exam room while observing appropriate contact time as indicated for disinfecting solutions.    HPI The patient is here for an acute visit.  She is here today with her husband.  Lower back pain - was here last week and ended up going to ED for severe lower back pain -has a known L2 compression fracture, but was found to have a acute L1 compression fracture.  Further work-up in the emergency room showed Hemoccult positive stools, fluid in her colon suggestive of a fast transit, but no colitis or obstruction.  UTI was ruled out.  She was discharged home with oxycodone.  She is still in severe pain.  She does not tolerate the oxycodone so is just taking half of a Xanax twice daily.  Her husband has to help her with her ADLs.  She has an appointment with Dr. Maxie Better tomorrow.   She is still having black stools.  She has lost weight - she has no appetite and is not eating much.     Medications and allergies reviewed with patient and updated if appropriate.  Patient Active Problem List   Diagnosis Date Noted   History of small bowel obstruction 08/19/2021   Menopause present 64/33/2951   Umbilical hernia 88/41/6606   Atrophy of vagina 08/19/2021   Compression fracture of L1 lumbar vertebra ([place]) 08/18/2021   Compression fracture of L2 ([place]) 08/18/2021   Black stools 08/13/2021   Renal mass, right 06/30/2021   Lower back pain 06/30/2021   Acute cystitis 05/05/2021   Strain of left trapezius muscle 04/22/2021   Anxiety 12/31/2020   Pain in left foot 03/11/2020   Left leg swelling 03/05/2020   B12 deficiency 12/15/2019   Tingling in extremities 10/04/2019   Compression fracture of  thoracic vertebra ([place]) 08/21/2019   Osteoporosis 08/21/2019   Chronic thoracic back pain 08/16/2019   Atrial fibrillation ([place]) 02/01/2018   Leg cramps 07/05/2017   Constipation 01/25/2017   Closed fracture of proximal end of right humerus 07/09/2016   Cough 06/17/2016   Abdominal pain 03/10/2016   Hearing loss 01/03/2016   Allergic rhinitis 01/03/2016   Minimal Coronary Plaque by Cardiac Cath in 2013 09/11/2015   Frequent loose stools 08/28/2015   Altered bowel habits 09/26/2014   Hyponatremia    Acute mesenteric ischemia ([place])    Hypothyroidism 07/11/2012   Syncope 03/18/2012   Hypercholesteremia    Hypertension    Diabetes mellitus type 1 ([place])    Osteoarthritis    Thyroid nodule    Osteoporosis, post-menopausal    Malignant neoplasm of breast ([place]) 11/10/2011   Primary cancer of upper outer quadrant of left female breast ([place]) 08/21/2011    Current Outpatient Medications on File Prior to Visit  Medication Sig Dispense Refill   acetic acid 2 % otic solution Place 4 drops into both ears every 3 (three) hours. 15 mL 0   ALPRAZolam (XANAX) 0.5 MG tablet TAKE 1/2 TABLET TWICE DAILY AS NEEDED FOR ANXIETY OR SLEEP 30 tablet 5   aspirin EC 81 MG tablet Take 81 mg by mouth daily.     B Complex-C (SUPER B COMPLEX PO) Take 1 tablet by mouth daily.  BD VEO INSULIN SYRINGE U/F 31G X 15/64" 0.3 ML MISC USE WITH INJECTIONS 100 each 2   bisacodyl (DULCOLAX) 10 MG suppository Place 1 suppository (10 mg total) rectally as needed for moderate constipation. 12 suppository 0   carboxymethylcellulose (REFRESH PLUS) 0.5 % SOLN Place 1 drop into both eyes 3 (three) times daily as needed. Non-preservative     cholecalciferol (VITAMIN D) 1000 UNITS tablet Take 5,000 Units by mouth daily.      Continuous Blood Gluc Sensor ([place]) MISC by Does not apply route. Sensor is located in Right arm     DUREZOL 0.05 % EMUL Place 1 drop into the right eye 3 (three) times daily.      fish oil-omega-3 fatty acids 1000 MG capsule Take 2 g by mouth daily.     glucose blood test strip OneTouch Ultra Blue Test Strip     hydrochlorothiazide (HYDRODIURIL) 25 MG tablet TAKE (1) TABLET DAILY AS NEEDED. 90 tablet 0   insulin aspart (NOVOLOG FLEXPEN) 100 UNIT/ML FlexPen Novolog FlexPen U-100 Insulin aspart 100 unit/mL (3 mL) subcutaneous     insulin glargine (LANTUS) 100 UNIT/ML Solostar Pen Inject 14 Units into the skin daily at 10 pm.      insulin lispro (HUMALOG) 100 UNIT/ML KiwkPen Inject into the skin. Sliding scale before meal     Insulin Pen Needle (BD PEN NEEDLE NANO 2ND GEN) 32G X 4 MM MISC INJECT AS DIRECTED FOUR TIMES A DAY     levothyroxine (SYNTHROID) 112 MCG tablet      losartan (COZAAR) 100 MG tablet TAKE 1 TABLET ONCE DAILY. 90 tablet 0   methocarbamol (ROBAXIN) 500 MG tablet Take 1 tablet (500 mg total) by mouth at bedtime as needed for muscle spasms. 30 tablet 5   metoprolol succinate (TOPROL-XL) 25 MG 24 hr tablet TAKE 1/2 TABLET AS NEEDED FOR PALPITATIONS. 45 tablet 3   moxifloxacin (VIGAMOX) 0.5 % ophthalmic solution Place 1 drop into the right eye 4 (four) times daily.     Multiple Vitamin (MULTIVITAMIN) tablet Take 1 tablet by mouth daily. For breast and bone health     ondansetron (ZOFRAN) 4 MG tablet Take 1 tablet (4 mg total) by mouth every 8 (eight) hours as needed for nausea or vomiting. 20 tablet 0   potassium chloride SA (KLOR-CON M) 20 MEQ tablet Take 1 tablet (20 mEq total) by mouth 2 (two) times daily. 10 tablet 0   prednisoLONE acetate (PRED FORTE) 1 % ophthalmic suspension prednisolone acetate 1 % eye drops,suspension  place 1 drop into the affected eye 4 times a day for 5 days.     saccharomyces boulardii (FLORASTOR) 250 MG capsule Take 1 capsule (250 mg total) by mouth 2 (two) times daily. 60 capsule 5   simvastatin (ZOCOR) 20 MG tablet TAKE ONE TABLET AT BEDTIME. 90 tablet 1   No current facility-administered medications on file prior to visit.     Past Medical History:  Diagnosis Date   Anxiety    Arthritis    fingers   Bowel obstruction ([place])    Cancer of upper-outer quadrant of female breast ([place]) 08/21/2011   ER +  PR +  Her 2 -  Ki67 9%   0.9 cm invasive lobular  Carcinoma ,s/p central lumpectomy with sentinel node biopsy,  ER/PR positive s/p re-excion on 09/16/11 with final pathology showing atypical hyperplasia    CAP (community acquired pneumonia) 12/06/2016   Diabetes mellitus type 1 ([place])    type I-  wears insulin pump   GERD (gastroesophageal reflux disease)    History of small bowel obstruction    Hypercholesteremia    Hypergastrinemia    Hypertension    Hypertension    Hyponatremia    Hypothyroid    Hypothyroidism    IBS (irritable bowel syndrome)    Osteoarthritis    Osteoporosis, post-menopausal    DEXA 12/02/11: -3.5 (with lomax gyn), declines bisphos due to bone pain   PAF (paroxysmal atrial fibrillation) ([place])    One episode in 2008, spontaneously converted in the ED, no further treatment   Spondylosis    thoracic spine   Thyroid nodule dx 07/2011   [place] q 6mo to follow calcified L thyroid nodule (6/77/03 [place])   Umbilical hernia     Past Surgical History:  Procedure Laterality Date   ABDOMINAL HYSTERECTOMY     ABDOMINAL HYSTERECTOMY  yrs ago   APPENDECTOMY     APPENDECTOMY  age 11   BLADDER REPAIR  5 yrs ago   BREAST LUMPECTOMY     BREAST SURGERY     biopsy   BREAST SURGERY   yrs ago   br bx   BREAST SURGERY  01/ 23/2013   left central lumpectomy and snbx, re-excision lumpectomy   CARDIOVASCULAR STRESS TEST  03/17/2012   colon surgery     Small intestines- took 6 inches out   COLONOSCOPY  07/11/2015   [facility] Dr Derrill Kay   ESOPHAGOGASTRODUODENOSCOPY  07/17/2010   [facility]   LEFT HEART CATHETERIZATION WITH CORONARY ANGIOGRAM N/A 03/18/2012   Procedure: LEFT HEART CATHETERIZATION WITH CORONARY ANGIOGRAM;  Surgeon: Minus Breeding, MD;  [facility] CATH LAB;  Service: Cardiovascular;   Laterality: N/A;   TONSILLECTOMY   age 72    Social History   Socioeconomic History   Marital status: Married    Spouse name: Not on file   Number of children: Not on file   Years of education: Not on file   Highest education level: Not on file  Occupational History   Not on file  Tobacco Use   Smoking status: Former    Packs/day: 1.00    Years: 10.00    Pack years: 10.00    Types: Cigarettes    Quit date: 08/27/1977    Years since quitting: 44.0   Smokeless tobacco: Never  Vaping Use   Vaping Use: Never used  Substance and Sexual Activity   Alcohol use: Yes    Comment: rare   Drug use: No   Sexual activity: Yes    Birth control/protection: Post-menopausal  Other Topics Concern   Not on file  Social History Narrative   ** Merged History Encounter **       Social Determinants of Health   Financial Resource Strain: Not on file  Food Insecurity: Not on file  Transportation Needs: Not on file  Physical Activity: Not on file  Stress: Not on file  Social Connections: Not on file    Family History  Problem Relation Age of Onset   Cancer Sister        unknown   Hypertension Mother    Stroke Mother    Arthritis Father    Heart disease Father    Diabetes Son    Colon cancer Neg Hx    Esophageal cancer Neg Hx     Review of Systems  Constitutional:  Positive for appetite change and unexpected weight change. Negative for fever.  Respiratory:  Positive for cough. Negative for shortness of  breath and wheezing.   Cardiovascular:  Positive for chest pain (a little) and palpitations (a little).  Gastrointestinal:  Positive for diarrhea (black stool) and nausea. Negative for abdominal pain.       No gerd  Musculoskeletal:  Positive for back pain.  Neurological:  Positive for headaches. Negative for light-headedness.      Objective:   Vitals:   08/19/21 1359  BP: 140/80  Pulse: 90  Temp: 98.9 F (37.2 C)  SpO2: 95%   BP Readings from Last 3 Encounters:   08/19/21 140/80  08/14/21 (!) 183/78  08/13/21 (!) 146/84   Wt Readings from Last 3 Encounters:  08/19/21 120 lb (54.4 kg)  08/13/21 119 lb (54 kg)  07/14/21 124 lb (56.2 kg)   Body mass index is 18.79 kg/m.   Physical Exam Constitutional:      Comments: Elderly, frail female in no acute distress.  Temporal wasting  HENT:     Head: Normocephalic and atraumatic.  Cardiovascular:     Rate and Rhythm: Normal rate and regular rhythm.  Pulmonary:     Effort: Pulmonary effort is normal. No respiratory distress.     Breath sounds: No wheezing or rales.  Abdominal:     General: There is no distension.     Palpations: Abdomen is soft.     Tenderness: There is abdominal tenderness (Minimal, diffuse). There is no guarding or rebound.  Musculoskeletal:        General: Tenderness (Lumbar spine) present.     Right lower leg: No edema.     Left lower leg: No edema.  Skin:    General: Skin is warm and dry.  Neurological:     Mental Status: She is alert.           Assessment & Plan:    See Problem List for Assessment and Plan of chronic medical problems.

## 2021-08-19 ENCOUNTER — Encounter: Payer: Self-pay | Admitting: Internal Medicine

## 2021-08-19 ENCOUNTER — Other Ambulatory Visit: Payer: Self-pay

## 2021-08-19 ENCOUNTER — Ambulatory Visit: Payer: Medicare PPO | Admitting: Internal Medicine

## 2021-08-19 VITALS — BP 140/80 | HR 90 | Temp 98.9°F | Ht 67.0 in | Wt 120.0 lb

## 2021-08-19 DIAGNOSIS — Z780 Asymptomatic menopausal state: Secondary | ICD-10-CM | POA: Insufficient documentation

## 2021-08-19 DIAGNOSIS — I10 Essential (primary) hypertension: Secondary | ICD-10-CM

## 2021-08-19 DIAGNOSIS — S32010D Wedge compression fracture of first lumbar vertebra, subsequent encounter for fracture with routine healing: Secondary | ICD-10-CM

## 2021-08-19 DIAGNOSIS — M810 Age-related osteoporosis without current pathological fracture: Secondary | ICD-10-CM

## 2021-08-19 DIAGNOSIS — S32020D Wedge compression fracture of second lumbar vertebra, subsequent encounter for fracture with routine healing: Secondary | ICD-10-CM

## 2021-08-19 DIAGNOSIS — K921 Melena: Secondary | ICD-10-CM

## 2021-08-19 DIAGNOSIS — Z8719 Personal history of other diseases of the digestive system: Secondary | ICD-10-CM | POA: Insufficient documentation

## 2021-08-19 DIAGNOSIS — K429 Umbilical hernia without obstruction or gangrene: Secondary | ICD-10-CM | POA: Insufficient documentation

## 2021-08-19 DIAGNOSIS — N952 Postmenopausal atrophic vaginitis: Secondary | ICD-10-CM | POA: Insufficient documentation

## 2021-08-19 MED ORDER — TRAMADOL HCL 50 MG PO TABS: 50.0000 mg | ORAL_TABLET | Freq: Three times a day (TID) | ORAL | 0 refills | Status: DC | PRN

## 2021-08-19 NOTE — Assessment & Plan Note (Signed)
Chronic Blood pressure controlled Continue HCTZ 25 mg daily, losartan 100 mg daily

## 2021-08-19 NOTE — Assessment & Plan Note (Signed)
Chronic We have discussed medication for osteoporosis in the past and I did ask her today about starting medication for her OP to prevent further fractures but she deferred at this time Will addressed at a future appointment

## 2021-08-19 NOTE — Assessment & Plan Note (Signed)
Subacute Not sure how long this has been going on in the past she has had a few days, now she thinks months She was Hemoccult positive in the ED so concern for possible blood Also having diarrhea, decreased appetite and weight loss-part of which are likely related to her severe pain Not currently taking any Pepto-Bismol, but did at 1 point CBC normal in the ED Will refer to GI

## 2021-08-19 NOTE — Assessment & Plan Note (Signed)
Acute When she went to the ED she was found to have an acute L1 compression fracture on top of her known L2 compression fracture Discussed that this explains her back pain She has seen Dr. Maxie Better for this in the past-was treated with pain medication, but unfortunately she does not tolerate hydrocodone or oxycodone She is in significant pain ?  Candidate for kyphoplasty or not Sees Dr. Maxie Better tomorrow She wants an MRI, which is reasonable-ordered Start tramadol 50 mg 3 times daily as needed-she has tolerated that in the past.  Advised to take Tylenol with the tramadol.  Advised stool softener if needed with the tramadol since she suffers from chronic constipation although currently having diarrhea She defers considering medication for osteoporosis at this time

## 2021-08-19 NOTE — Patient Instructions (Addendum)
° ° °  Medications changes include :   tramadol 50 mg three times a day as needed for severe pain. Take tylenol with the tramadol.    Your prescription(s) have been submitted to your pharmacy. Please take as directed and contact our office if you believe you are having problem(s) with the medication(s).   A referral was ordered for [name] - GI for your black stools.  An MRI was ordered.      Someone from their offices will call you to schedule an appointment.

## 2021-08-19 NOTE — Assessment & Plan Note (Signed)
As above.

## 2021-08-21 ENCOUNTER — Other Ambulatory Visit: Payer: Self-pay

## 2021-08-21 ENCOUNTER — Ambulatory Visit
Admission: RE | Admit: 2021-08-21 | Discharge: 2021-08-21 | Disposition: A | Discharge status: Home / Self Care | Payer: Medicare PPO | Source: Ambulatory Visit | Attending: Internal Medicine | Admitting: Internal Medicine

## 2021-08-21 DIAGNOSIS — S32020D Wedge compression fracture of second lumbar vertebra, subsequent encounter for fracture with routine healing: Secondary | ICD-10-CM

## 2021-08-21 DIAGNOSIS — S32010D Wedge compression fracture of first lumbar vertebra, subsequent encounter for fracture with routine healing: Secondary | ICD-10-CM

## 2021-08-21 DIAGNOSIS — M48061 Spinal stenosis, lumbar region without neurogenic claudication: Secondary | ICD-10-CM | POA: Diagnosis not present

## 2021-08-22 ENCOUNTER — Telehealth: Payer: Self-pay | Admitting: Internal Medicine

## 2021-08-22 NOTE — Telephone Encounter (Signed)
The MRI shows L1 and L2 fractures. With some height loss.   She has arthritis and arthritic changes in the spine.  There are also areas of inflammation in the spine which may be related to the fracture.      Please fax MRI report to Dr Maxie Better.

## 2021-08-22 NOTE — Telephone Encounter (Signed)
Results given to patient today and MRI report faxed to Dr. Susa Day.

## 2021-08-22 NOTE — Telephone Encounter (Signed)
Patient requesting a call back to discuss MRI results

## 2021-08-26 DIAGNOSIS — M5451 Vertebrogenic low back pain: Secondary | ICD-10-CM | POA: Diagnosis not present

## 2021-08-26 DIAGNOSIS — E1065 Type 1 diabetes mellitus with hyperglycemia: Secondary | ICD-10-CM | POA: Diagnosis not present

## 2021-08-28 ENCOUNTER — Telehealth: Payer: Self-pay | Admitting: Internal Medicine

## 2021-08-28 ENCOUNTER — Encounter: Payer: Self-pay | Admitting: Internal Medicine

## 2021-08-28 MED ORDER — IBUPROFEN 600 MG PO TABS: 600.0000 mg | ORAL_TABLET | Freq: Two times a day (BID) | ORAL | 0 refills | Status: AC | PRN

## 2021-08-28 NOTE — Telephone Encounter (Signed)
We can do ibuprofen but only for a very short time - it can injure the stomach and the kidneys - I would only recommend only 600 mg  1-2 times a day and no longer than 2 weeks.  Rx sent to pharmacy

## 2021-08-28 NOTE — Telephone Encounter (Signed)
Pt called and is requesting that Dr. Quay Burow give her the okay to take Ibuprofen 800mg - Take 1 every 6 hours for pain- as her daughter takes. She wants it to be called in, if it is okay.   [facility] [phone] - [address], [address] Phone:  [phone]  Fax:  [phone]

## 2021-08-29 NOTE — Telephone Encounter (Signed)
Message left today for patient with Dr. Quay Burow recommendations.

## 2021-09-02 ENCOUNTER — Telehealth: Payer: Self-pay | Admitting: Internal Medicine

## 2021-09-02 DIAGNOSIS — M545 Low back pain, unspecified: Secondary | ICD-10-CM

## 2021-09-02 DIAGNOSIS — S32010D Wedge compression fracture of first lumbar vertebra, subsequent encounter for fracture with routine healing: Secondary | ICD-10-CM

## 2021-09-02 NOTE — Telephone Encounter (Signed)
Patient states she needs the same type of referral that was given to her husband to with [facility] for herself.

## 2021-09-02 NOTE — Telephone Encounter (Signed)
Referral placed per Dr. Lovena Le.

## 2021-09-03 NOTE — Telephone Encounter (Signed)
Left detailed message per DPR advising referral had been placed.

## 2021-09-04 ENCOUNTER — Telehealth (HOSPITAL_COMMUNITY): Payer: Self-pay

## 2021-09-04 NOTE — Telephone Encounter (Signed)
Ok per [employer] for L1/L2 KP. AW

## 2021-09-09 ENCOUNTER — Telehealth (HOSPITAL_COMMUNITY): Payer: Self-pay

## 2021-09-09 NOTE — Telephone Encounter (Signed)
Called to schedule new pt referral, no answer, left vm. AW

## 2021-09-10 DIAGNOSIS — I10 Essential (primary) hypertension: Secondary | ICD-10-CM | POA: Diagnosis not present

## 2021-09-10 DIAGNOSIS — S32010A Wedge compression fracture of first lumbar vertebra, initial encounter for closed fracture: Secondary | ICD-10-CM | POA: Diagnosis not present

## 2021-09-11 DIAGNOSIS — R739 Hyperglycemia, unspecified: Secondary | ICD-10-CM | POA: Diagnosis not present

## 2021-09-11 DIAGNOSIS — Z20822 Contact with and (suspected) exposure to covid-19: Secondary | ICD-10-CM | POA: Diagnosis not present

## 2021-09-11 DIAGNOSIS — R112 Nausea with vomiting, unspecified: Secondary | ICD-10-CM | POA: Diagnosis not present

## 2021-09-11 DIAGNOSIS — E860 Dehydration: Secondary | ICD-10-CM | POA: Diagnosis not present

## 2021-09-11 DIAGNOSIS — G4489 Other headache syndrome: Secondary | ICD-10-CM | POA: Diagnosis not present

## 2021-09-11 DIAGNOSIS — E1165 Type 2 diabetes mellitus with hyperglycemia: Secondary | ICD-10-CM | POA: Diagnosis not present

## 2021-09-11 DIAGNOSIS — R1111 Vomiting without nausea: Secondary | ICD-10-CM | POA: Diagnosis not present

## 2021-09-12 ENCOUNTER — Encounter (HOSPITAL_COMMUNITY): Payer: Self-pay

## 2021-09-12 ENCOUNTER — Emergency Department (HOSPITAL_COMMUNITY): Payer: Medicare PPO

## 2021-09-12 ENCOUNTER — Inpatient Hospital Stay (HOSPITAL_COMMUNITY)
Admission: EM | Admit: 2021-09-12 | Discharge: 2021-09-24 | DRG: 637 | Disposition: A | Discharge status: Skilled Nursing Facility | Payer: Medicare PPO | Attending: Internal Medicine | Admitting: Internal Medicine

## 2021-09-12 DIAGNOSIS — N179 Acute kidney failure, unspecified: Secondary | ICD-10-CM | POA: Diagnosis present

## 2021-09-12 DIAGNOSIS — R410 Disorientation, unspecified: Secondary | ICD-10-CM | POA: Diagnosis not present

## 2021-09-12 DIAGNOSIS — E1010 Type 1 diabetes mellitus with ketoacidosis without coma: Secondary | ICD-10-CM | POA: Diagnosis not present

## 2021-09-12 DIAGNOSIS — T383X6A Underdosing of insulin and oral hypoglycemic [antidiabetic] drugs, initial encounter: Secondary | ICD-10-CM | POA: Diagnosis present

## 2021-09-12 DIAGNOSIS — S32020A Wedge compression fracture of second lumbar vertebra, initial encounter for closed fracture: Secondary | ICD-10-CM | POA: Diagnosis present

## 2021-09-12 DIAGNOSIS — E876 Hypokalemia: Secondary | ICD-10-CM | POA: Diagnosis present

## 2021-09-12 DIAGNOSIS — Z9114 Patient's other noncompliance with medication regimen: Secondary | ICD-10-CM | POA: Diagnosis not present

## 2021-09-12 DIAGNOSIS — I700 Atherosclerosis of aorta: Secondary | ICD-10-CM | POA: Diagnosis not present

## 2021-09-12 DIAGNOSIS — E860 Dehydration: Secondary | ICD-10-CM | POA: Diagnosis present

## 2021-09-12 DIAGNOSIS — L8990 Pressure ulcer of unspecified site, unspecified stage: Secondary | ICD-10-CM | POA: Insufficient documentation

## 2021-09-12 DIAGNOSIS — Z20822 Contact with and (suspected) exposure to covid-19: Secondary | ICD-10-CM | POA: Diagnosis present

## 2021-09-12 DIAGNOSIS — G9340 Encephalopathy, unspecified: Secondary | ICD-10-CM | POA: Diagnosis not present

## 2021-09-12 DIAGNOSIS — Z751 Person awaiting admission to adequate facility elsewhere: Secondary | ICD-10-CM

## 2021-09-12 DIAGNOSIS — R9431 Abnormal electrocardiogram [ECG] [EKG]: Secondary | ICD-10-CM | POA: Diagnosis not present

## 2021-09-12 DIAGNOSIS — E871 Hypo-osmolality and hyponatremia: Secondary | ICD-10-CM | POA: Diagnosis present

## 2021-09-12 DIAGNOSIS — F419 Anxiety disorder, unspecified: Secondary | ICD-10-CM | POA: Diagnosis present

## 2021-09-12 DIAGNOSIS — R319 Hematuria, unspecified: Secondary | ICD-10-CM

## 2021-09-12 DIAGNOSIS — Z881 Allergy status to other antibiotic agents status: Secondary | ICD-10-CM

## 2021-09-12 DIAGNOSIS — Z823 Family history of stroke: Secondary | ICD-10-CM

## 2021-09-12 DIAGNOSIS — I480 Paroxysmal atrial fibrillation: Secondary | ICD-10-CM | POA: Diagnosis present

## 2021-09-12 DIAGNOSIS — R309 Painful micturition, unspecified: Secondary | ICD-10-CM | POA: Diagnosis not present

## 2021-09-12 DIAGNOSIS — Z91040 Latex allergy status: Secondary | ICD-10-CM

## 2021-09-12 DIAGNOSIS — Z90710 Acquired absence of both cervix and uterus: Secondary | ICD-10-CM

## 2021-09-12 DIAGNOSIS — R946 Abnormal results of thyroid function studies: Secondary | ICD-10-CM | POA: Diagnosis present

## 2021-09-12 DIAGNOSIS — E11649 Type 2 diabetes mellitus with hypoglycemia without coma: Secondary | ICD-10-CM | POA: Diagnosis not present

## 2021-09-12 DIAGNOSIS — L89151 Pressure ulcer of sacral region, stage 1: Secondary | ICD-10-CM | POA: Diagnosis not present

## 2021-09-12 DIAGNOSIS — R9349 Abnormal radiologic findings on diagnostic imaging of other urinary organs: Secondary | ICD-10-CM | POA: Diagnosis not present

## 2021-09-12 DIAGNOSIS — M47814 Spondylosis without myelopathy or radiculopathy, thoracic region: Secondary | ICD-10-CM | POA: Diagnosis present

## 2021-09-12 DIAGNOSIS — R531 Weakness: Secondary | ICD-10-CM | POA: Diagnosis not present

## 2021-09-12 DIAGNOSIS — E7800 Pure hypercholesterolemia, unspecified: Secondary | ICD-10-CM | POA: Diagnosis present

## 2021-09-12 DIAGNOSIS — Z8261 Family history of arthritis: Secondary | ICD-10-CM

## 2021-09-12 DIAGNOSIS — Z794 Long term (current) use of insulin: Secondary | ICD-10-CM

## 2021-09-12 DIAGNOSIS — Z79899 Other long term (current) drug therapy: Secondary | ICD-10-CM

## 2021-09-12 DIAGNOSIS — N2889 Other specified disorders of kidney and ureter: Secondary | ICD-10-CM | POA: Diagnosis present

## 2021-09-12 DIAGNOSIS — Z8249 Family history of ischemic heart disease and other diseases of the circulatory system: Secondary | ICD-10-CM

## 2021-09-12 DIAGNOSIS — S32020D Wedge compression fracture of second lumbar vertebra, subsequent encounter for fracture with routine healing: Secondary | ICD-10-CM | POA: Diagnosis not present

## 2021-09-12 DIAGNOSIS — M4856XA Collapsed vertebra, not elsewhere classified, lumbar region, initial encounter for fracture: Secondary | ICD-10-CM | POA: Diagnosis present

## 2021-09-12 DIAGNOSIS — Z79890 Hormone replacement therapy (postmenopausal): Secondary | ICD-10-CM

## 2021-09-12 DIAGNOSIS — N950 Postmenopausal bleeding: Secondary | ICD-10-CM | POA: Diagnosis not present

## 2021-09-12 DIAGNOSIS — Z9049 Acquired absence of other specified parts of digestive tract: Secondary | ICD-10-CM

## 2021-09-12 DIAGNOSIS — I10 Essential (primary) hypertension: Secondary | ICD-10-CM | POA: Diagnosis present

## 2021-09-12 DIAGNOSIS — Z853 Personal history of malignant neoplasm of breast: Secondary | ICD-10-CM

## 2021-09-12 DIAGNOSIS — R310 Gross hematuria: Secondary | ICD-10-CM | POA: Diagnosis not present

## 2021-09-12 DIAGNOSIS — E861 Hypovolemia: Secondary | ICD-10-CM | POA: Diagnosis present

## 2021-09-12 DIAGNOSIS — Z833 Family history of diabetes mellitus: Secondary | ICD-10-CM

## 2021-09-12 DIAGNOSIS — G9341 Metabolic encephalopathy: Secondary | ICD-10-CM | POA: Diagnosis present

## 2021-09-12 DIAGNOSIS — E039 Hypothyroidism, unspecified: Secondary | ICD-10-CM | POA: Diagnosis present

## 2021-09-12 DIAGNOSIS — Z7982 Long term (current) use of aspirin: Secondary | ICD-10-CM

## 2021-09-12 DIAGNOSIS — R4182 Altered mental status, unspecified: Secondary | ICD-10-CM | POA: Diagnosis not present

## 2021-09-12 DIAGNOSIS — Z888 Allergy status to other drugs, medicaments and biological substances status: Secondary | ICD-10-CM

## 2021-09-12 DIAGNOSIS — E1110 Type 2 diabetes mellitus with ketoacidosis without coma: Secondary | ICD-10-CM | POA: Diagnosis present

## 2021-09-12 DIAGNOSIS — K649 Unspecified hemorrhoids: Secondary | ICD-10-CM | POA: Diagnosis present

## 2021-09-12 DIAGNOSIS — T381X6A Underdosing of thyroid hormones and substitutes, initial encounter: Secondary | ICD-10-CM | POA: Diagnosis present

## 2021-09-12 DIAGNOSIS — T502X5A Adverse effect of carbonic-anhydrase inhibitors, benzothiadiazides and other diuretics, initial encounter: Secondary | ICD-10-CM | POA: Diagnosis not present

## 2021-09-12 DIAGNOSIS — R739 Hyperglycemia, unspecified: Secondary | ICD-10-CM | POA: Diagnosis present

## 2021-09-12 DIAGNOSIS — E1165 Type 2 diabetes mellitus with hyperglycemia: Secondary | ICD-10-CM | POA: Diagnosis not present

## 2021-09-12 DIAGNOSIS — N3289 Other specified disorders of bladder: Secondary | ICD-10-CM | POA: Diagnosis not present

## 2021-09-12 DIAGNOSIS — Z8550 Personal history of malignant neoplasm of unspecified urinary tract organ: Secondary | ICD-10-CM

## 2021-09-12 DIAGNOSIS — I4891 Unspecified atrial fibrillation: Secondary | ICD-10-CM | POA: Diagnosis present

## 2021-09-12 DIAGNOSIS — Z91138 Patient's unintentional underdosing of medication regimen for other reason: Secondary | ICD-10-CM

## 2021-09-12 DIAGNOSIS — Z7401 Bed confinement status: Secondary | ICD-10-CM | POA: Diagnosis not present

## 2021-09-12 DIAGNOSIS — E1129 Type 2 diabetes mellitus with other diabetic kidney complication: Secondary | ICD-10-CM | POA: Diagnosis not present

## 2021-09-12 DIAGNOSIS — R109 Unspecified abdominal pain: Secondary | ICD-10-CM | POA: Diagnosis not present

## 2021-09-12 DIAGNOSIS — Z87891 Personal history of nicotine dependence: Secondary | ICD-10-CM

## 2021-09-12 DIAGNOSIS — R197 Diarrhea, unspecified: Secondary | ICD-10-CM | POA: Diagnosis not present

## 2021-09-12 LAB — URINALYSIS, ROUTINE W REFLEX MICROSCOPIC
Bilirubin Urine: NEGATIVE
Glucose, UA: >=500 mg/dL — AB
Ketones, ur: >80 mg/dL — AB
Leukocytes,Ua: NEGATIVE
Nitrite: NEGATIVE
Protein, ur: NEGATIVE mg/dL
Specific Gravity, Urine: 1.02 (ref 1.005–1.030)
pH: 6 (ref 5.0–8.0)

## 2021-09-12 LAB — CBC WITH DIFFERENTIAL/PLATELET
Abs Immature Granulocytes: 0.04 10*3/uL (ref 0.00–0.07)
Basophils Absolute: 0 10*3/uL (ref 0.0–0.1)
Basophils Relative: 0 %
Eosinophils Absolute: 0 10*3/uL (ref 0.0–0.5)
Eosinophils Relative: 0 %
HCT: 38.2 % (ref 36.0–46.0)
Hemoglobin: 12.9 g/dL (ref 12.0–15.0)
Immature Granulocytes: 1 %
Lymphocytes Relative: 7 %
Lymphs Abs: 0.5 10*3/uL — ABNORMAL LOW (ref 0.7–4.0)
MCH: 30.7 pg (ref 26.0–34.0)
MCHC: 33.8 g/dL (ref 30.0–36.0)
MCV: 91 fL (ref 80.0–100.0)
Monocytes Absolute: 0.5 10*3/uL (ref 0.1–1.0)
Monocytes Relative: 7 %
Neutro Abs: 6 10*3/uL (ref 1.7–7.7)
Neutrophils Relative %: 85 %
Platelets: 270 10*3/uL (ref 150–400)
RBC: 4.2 MIL/uL (ref 3.87–5.11)
RDW: 12.9 % (ref 11.5–15.5)
WBC: 7.1 10*3/uL (ref 4.0–10.5)
nRBC: 0 % (ref 0.0–0.2)

## 2021-09-12 LAB — RESP PANEL BY RT-PCR (FLU A&B, COVID) ARPGX2
Influenza A by PCR: NEGATIVE
Influenza B by PCR: NEGATIVE
SARS Coronavirus 2 by RT PCR: NEGATIVE

## 2021-09-12 LAB — COMPREHENSIVE METABOLIC PANEL
ALT: 16 U/L (ref 0–44)
AST: 30 U/L (ref 15–41)
Albumin: 3.9 g/dL (ref 3.5–5.0)
Alkaline Phosphatase: 89 U/L (ref 38–126)
Anion gap: 22 — ABNORMAL HIGH (ref 5–15)
BUN: 22 mg/dL (ref 8–23)
CO2: 15 mmol/L — ABNORMAL LOW (ref 22–32)
Calcium: 9.7 mg/dL (ref 8.9–10.3)
Chloride: 88 mmol/L — ABNORMAL LOW (ref 98–111)
Creatinine, Ser: 1.26 mg/dL — ABNORMAL HIGH (ref 0.44–1.00)
GFR, Estimated: 41 mL/min — ABNORMAL LOW (ref >60)
Glucose, Bld: 469 mg/dL — ABNORMAL HIGH (ref 70–99)
Potassium: 4.1 mmol/L (ref 3.5–5.1)
Sodium: 125 mmol/L — ABNORMAL LOW (ref 135–145)
Total Bilirubin: 1.9 mg/dL — ABNORMAL HIGH (ref 0.3–1.2)
Total Protein: 7.5 g/dL (ref 6.5–8.1)

## 2021-09-12 LAB — URINALYSIS, MICROSCOPIC (REFLEX)

## 2021-09-12 LAB — CBG MONITORING, ED
Glucose-Capillary: 458 mg/dL — ABNORMAL HIGH (ref 70–99)
Glucose-Capillary: 336 mg/dL — ABNORMAL HIGH (ref 70–99)

## 2021-09-12 MED ORDER — INSULIN ASPART 100 UNIT/ML IJ SOLN
12.0000 [IU] | Freq: Once | INTRAMUSCULAR | Status: AC
  Administered 2021-09-12: 12 [IU] via SUBCUTANEOUS

## 2021-09-12 MED ORDER — SODIUM CHLORIDE 0.9 % IV BOLUS
1000.0000 mL | Freq: Once | INTRAVENOUS | Status: AC
  Administered 2021-09-12: 1000 mL via INTRAVENOUS

## 2021-09-12 MED ORDER — IOHEXOL 300 MG/ML  SOLN
80.0000 mL | Freq: Once | INTRAMUSCULAR | Status: AC | PRN
  Administered 2021-09-12: 80 mL via INTRAVENOUS

## 2021-09-12 NOTE — H&P (Signed)
HISTORY AND PHYSICAL  Patient: [name] 88 y.o. female MRN: [id number]  Today 09/12/21 is hospital day 0 after admission on 09/12/2021  4:10 PM   RECORD [place]: Presented to ED in the afternoon with chief complaint of confusion and hyperglycemia over the past couple of days.  Patient typically takes sliding scale NovoLog and 14 units Lantus nightly, has not been taking her insulin over the past 4-5 days since her home glucometer was not functioning and she was not able to obtain a new one.  Blood pressure was elevated up to 162/110 on initial ED evaluation, patient was also complaining of some left lower quadrant abdominal pain though exam was normal.  Labs demonstrated pseudohyponatremia, corrected sodium is within normal range, AKI with creatinine 1.26, her baseline over the past year or creatinine about 1.74-0.83, glucose to 469, glucose on UA greater than 500, ketones on urine greater than 80, UA also showed epithelial cells, no WBC.  EKG was normal sinus rhythm.  Patient was given bolus normal saline, NovoLog 12 units subq.   Procedures and Significant Results:  CT abdomen 09/12/2021: Worsening acute compression fracture of L1, worsening of right kidney mass recommending follow-up MRI versus ultrasound, on review of outpatient records patient is following with urology for this.  Consultants:  none    SUBJECTIVE:  Patient seen and examined in ED, husband at bedside.  She reports feeling a little bit better since arriving in the ED and getting fluids.  She reports some mild abdominal pain and lower back pain.  Her husband states that her confusion is a little bit better, she is still a bit confused but unknown baseline, she has trouble remembering the name of her insulins but she eventually is able to tell me what she takes in terms of her Lantus and NovoLog.     ASSESSMENT & PLAN  Hyperglycemia due to diabetes mellitus (HCC) - nonadherence to home therapy d/t  glucometer malfunction, assoc w/ ketosis -observation and if hyperglycemia/ketosis corrects and confirm can obtain glucometer can hopefully d/c home in AM, patient is generally compliant with medications  -restart insulin, home basal dose + sliding scale  -(+)urine ketones, added beta hydrobutyric acid, will trend -pseudohyponatremia, mild hypokalemia, will trend  (repeating labs now and again in AM)   Hypertension -continue home lisinopril -has home matoprolol and HCT prn - these are held  Hypothyroidism -continue home dose synthroid, added TSH d/t previous levels being abn  Compression fracture of L2 (HCC) -worsening based on imaging -pain control, outpatient follow-up  History of Atrial fibrillation (HCC) - currently sinus rhythm -sinus on EKG in ED, no palpitations   Right renal mass Per PCP last note, following w/ urology outpatient  Nothing on UA/mico now to concern for UTI/pyelo/abscess  AKI (acute kidney injury) ([place]) -IV fluids -trend AM labs   VTE Ppx: Lovenox CODE STATUS   Code Status: Full Code Admitted from: home Expected Dispo: home Barriers to discharge: pending clinical improvement, resolution of ketosis and AKI, ensure has needed DME to monitor home Glc and ensure emdication adherence Family communication: husband at bedside in ED              Past Medical History:  Diagnosis Date   Anxiety    Arthritis    fingers   Bowel obstruction ([place])    Cancer of upper-outer quadrant of female breast ([facility]) 08/21/2011   ER +  PR +  Her 2 -  Ki67 9%  0.9 cm invasive lobular  Carcinoma ,s/p central lumpectomy with sentinel node biopsy,  ER/PR positive s/p re-excion on 09/16/11 with final pathology showing atypical hyperplasia    CAP (community acquired pneumonia) 12/06/2016   Diabetes mellitus type 1 ([place])    type I- wears insulin pump   GERD (gastroesophageal reflux disease)    History of small bowel obstruction    Hypercholesteremia    Hypergastrinemia     Hypertension    Hypertension    Hyponatremia    Hypothyroid    Hypothyroidism    IBS (irritable bowel syndrome)    Osteoarthritis    Osteoporosis, post-menopausal    DEXA 12/02/11: -3.5 (with lomax gyn), declines bisphos due to bone pain   PAF (paroxysmal atrial fibrillation) ([place])    One episode in 2008, spontaneously converted in the ED, no further treatment   Spondylosis    thoracic spine   Thyroid nodule dx 07/2011   [place] q 36mo to follow calcified L thyroid nodule (2/54/27 [place])   Umbilical hernia     Past Surgical History:  Procedure Laterality Date   ABDOMINAL HYSTERECTOMY     ABDOMINAL HYSTERECTOMY  yrs ago   APPENDECTOMY     APPENDECTOMY  age 70   BLADDER REPAIR  5 yrs ago   BREAST LUMPECTOMY     BREAST SURGERY     biopsy   BREAST SURGERY   yrs ago   br bx   BREAST SURGERY  01/ 23/2013   left central lumpectomy and snbx, re-excision lumpectomy   CARDIOVASCULAR STRESS TEST  03/17/2012   colon surgery     Small intestines- took 6 inches out   COLONOSCOPY  07/11/2015   [facility] Dr Derrill Kay   ESOPHAGOGASTRODUODENOSCOPY  07/17/2010   [facility]   LEFT HEART CATHETERIZATION WITH CORONARY ANGIOGRAM N/A 03/18/2012   Procedure: LEFT HEART CATHETERIZATION WITH CORONARY ANGIOGRAM;  Surgeon: Minus Breeding, MD;  Location: [facility] CATH LAB;  Service: Cardiovascular;  Laterality: N/A;   TONSILLECTOMY   age 15    Family History  Problem Relation Age of Onset   Cancer Sister        unknown   Hypertension Mother    Stroke Mother    Arthritis Father    Heart disease Father    Diabetes Son    Colon cancer Neg Hx    Esophageal cancer Neg Hx    Social History:  reports that she quit smoking about 44 years ago. Her smoking use included cigarettes. She has a 10.00 pack-year smoking history. She has never used smokeless tobacco. She reports current alcohol use. She reports that she does not use drugs.  Allergies:  Allergies  Allergen Reactions   Eliquis [Apixaban] Other (See  Comments)    Dizziness, severe headach   Amlodipine     Dizzy, nausea   Augmentin [Amoxicillin-Pot Clavulanate] Other (See Comments)    Gi upset only no rash or hives    Cortisone Other (See Comments)    Makes blood sugars run high   Keflex [Cephalexin] Hives and Nausea Only    Stomach upset   Omeprazole Nausea Only    Dizziness and nausea   Prednisone     Other reaction(s): Other Makes blood sugars run high   Hydrocodone Rash    Rash to chest , side back   Latex Rash    Powder in gloves causes rash; "not allergic to latex just the powder"    (Not in a hospital admission)   Results for orders  placed or performed during the hospital encounter of 09/12/21 (from the past 48 hour(s))  CBC with Differential     Status: Abnormal   Collection Time: 09/12/21  4:32 PM  Result Value Ref Range   WBC 7.1 4.0 - 10.5 K/uL   RBC 4.20 3.87 - 5.11 MIL/uL   Hemoglobin 12.9 12.0 - 15.0 g/dL   HCT 38.2 36.0 - 46.0 %   MCV 91.0 80.0 - 100.0 fL   MCH 30.7 26.0 - 34.0 pg   MCHC 33.8 30.0 - 36.0 g/dL   RDW 12.9 11.5 - 15.5 %   Platelets 270 150 - 400 K/uL   nRBC 0.0 0.0 - 0.2 %   Neutrophils Relative % 85 %   Neutro Abs 6.0 1.7 - 7.7 K/uL   Lymphocytes Relative 7 %   Lymphs Abs 0.5 (L) 0.7 - 4.0 K/uL   Monocytes Relative 7 %   Monocytes Absolute 0.5 0.1 - 1.0 K/uL   Eosinophils Relative 0 %   Eosinophils Absolute 0.0 0.0 - 0.5 K/uL   Basophils Relative 0 %   Basophils Absolute 0.0 0.0 - 0.1 K/uL   Immature Granulocytes 1 %   Abs Immature Granulocytes 0.04 0.00 - 0.07 K/uL    Comment: Performed at [facility], [address], [place], [place] [address]  Comprehensive metabolic panel     Status: Abnormal   Collection Time: 09/12/21  4:32 PM  Result Value Ref Range   Sodium 125 (L) 135 - 145 mmol/L   Potassium 4.1 3.5 - 5.1 mmol/L   Chloride 88 (L) 98 - 111 mmol/L   CO2 15 (L) 22 - 32 mmol/L   Glucose, Bld 469 (H) 70 - 99 mg/dL    Comment: Glucose reference range applies only  to samples taken after fasting for at least 8 hours.   BUN 22 8 - 23 mg/dL   Creatinine, Ser 1.26 (H) 0.44 - 1.00 mg/dL   Calcium 9.7 8.9 - 10.3 mg/dL   Total Protein 7.5 6.5 - 8.1 g/dL   Albumin 3.9 3.5 - 5.0 g/dL   AST 30 15 - 41 U/L   ALT 16 0 - 44 U/L   Alkaline Phosphatase 89 38 - 126 U/L   Total Bilirubin 1.9 (H) 0.3 - 1.2 mg/dL   GFR, Estimated 41 (L) >60 mL/min    Comment: (NOTE) Calculated using the CKD-EPI Creatinine Equation (2021)    Anion gap 22 (H) 5 - 15    Comment: Electrolytes repeated to confirm. Performed at [facility], [place] [address], [place], [place] [address]   Urinalysis, Routine w reflex microscopic     Status: Abnormal   Collection Time: 09/12/21  4:32 PM  Result Value Ref Range   Color, Urine YELLOW YELLOW   APPearance CLEAR CLEAR   Specific Gravity, Urine 1.020 1.005 - 1.030   pH 6.0 5.0 - 8.0   Glucose, UA >=500 (A) NEGATIVE mg/dL   Hgb urine dipstick LARGE (A) NEGATIVE   Bilirubin Urine NEGATIVE NEGATIVE   Ketones, ur >80 (A) NEGATIVE mg/dL   Protein, ur NEGATIVE NEGATIVE mg/dL   Nitrite NEGATIVE NEGATIVE   Leukocytes,Ua NEGATIVE NEGATIVE    Comment: Performed at [facility] [address] [place], [place] [address]  Urinalysis, Microscopic (reflex)     Status: Abnormal   Collection Time: 09/12/21  4:32 PM  Result Value Ref Range   RBC / HPF 21-50 0 - 5 RBC/hpf   WBC, UA 6-10 0 -  5 WBC/hpf   Bacteria, UA RARE (A) NONE SEEN   Squamous Epithelial / LPF 6-10 0 - 5   Non Squamous Epithelial PRESENT (A) NONE SEEN   Mucus PRESENT     Comment: Performed at [facility], [place] [address], [place], [place] [address]  CBG monitoring, ED     Status: Abnormal   Collection Time: 09/12/21  4:49 PM  Result Value Ref Range   Glucose-Capillary 458 (H) 70 - 99 mg/dL    Comment: Glucose reference range applies only to samples taken after fasting for at least 8 hours.  Resp Panel by RT-PCR (Flu A&B, Covid) Nasopharyngeal Swab     Status:  None   Collection Time: 09/12/21  7:24 PM   Specimen: Nasopharyngeal Swab; Nasopharyngeal(NP) swabs in vial transport medium  Result Value Ref Range   SARS Coronavirus 2 by RT PCR NEGATIVE NEGATIVE    Comment: (NOTE) SARS-CoV-2 target nucleic acids are NOT DETECTED.  The SARS-CoV-2 RNA is generally detectable in upper respiratory specimens during the acute phase of infection. The lowest concentration of SARS-CoV-2 viral copies this assay can detect is 138 copies/mL. A negative result does not preclude SARS-Cov-2 infection and should not be used as the sole basis for treatment or other patient management decisions. A negative result may occur with  improper specimen collection/handling, submission of specimen other than nasopharyngeal swab, presence of viral mutation(s) within the areas targeted by this assay, and inadequate number of viral copies(<138 copies/mL). A negative result must be combined with clinical observations, patient history, and epidemiological information. The expected result is Negative.  Fact Sheet for Patients:  EntrepreneurPulse.com.au  Fact Sheet for Healthcare Providers:  IncredibleEmployment.be  This test is no t yet approved or cleared by the [place] FDA and  has been authorized for detection and/or diagnosis of SARS-CoV-2 by FDA under an Emergency Use Authorization (EUA). This EUA will remain  in effect (meaning this test can be used) for the duration of the COVID-19 declaration under Section 564(b)(1) of the Act, 21 U.S.C.section 360bbb-3(b)(1), unless the authorization is terminated  or revoked sooner.       Influenza A by PCR NEGATIVE NEGATIVE   Influenza B by PCR NEGATIVE NEGATIVE    Comment: (NOTE) The Xpert Xpress SARS-CoV-2/FLU/RSV plus assay is intended as an aid in the diagnosis of influenza from Nasopharyngeal swab specimens and should not be used as a sole basis for treatment. Nasal washings  and aspirates are unacceptable for Xpert Xpress SARS-CoV-2/FLU/RSV testing.  Fact Sheet for Patients: EntrepreneurPulse.com.au  Fact Sheet for Healthcare Providers: IncredibleEmployment.be  This test is not yet approved or cleared by the [place] FDA and has been authorized for detection and/or diagnosis of SARS-CoV-2 by FDA under an Emergency Use Authorization (EUA). This EUA will remain in effect (meaning this test can be used) for the duration of the COVID-19 declaration under Section 564(b)(1) of the Act, 21 U.S.C. section 360bbb-3(b)(1), unless the authorization is terminated or revoked.  Performed at [facility], [place] [address] [place], [place] [address]   CBG monitoring, ED     Status: Abnormal   Collection Time: 09/12/21  9:25 PM  Result Value Ref Range   Glucose-Capillary 336 (H) 70 - 99 mg/dL    Comment: Glucose reference range applies only to samples taken after fasting for at least 8 hours.   CT Abdomen Pelvis W Contrast  Result Date: 09/12/2021 CLINICAL DATA:  Abdominal pain, acute, nonlocalized.  Diabetes. EXAM: CT ABDOMEN AND  PELVIS WITH CONTRAST TECHNIQUE: Multidetector CT imaging of the abdomen and pelvis was performed using the standard protocol following bolus administration of intravenous contrast. RADIATION DOSE REDUCTION: This exam was performed according to the departmental dose-optimization program which includes automated exposure control, adjustment of the mA and/or kV according to patient size and/or use of iterative reconstruction technique. CONTRAST:  75mL OMNIPAQUE IOHEXOL 300 MG/ML  SOLN COMPARISON:  08/13/2021 FINDINGS: Lower chest: Lung bases are clear.  The heart is not enlarged. Hepatobiliary: No liver parenchymal abnormality is seen. No calcified gallstones. Pancreas: Atrophy of the pancreas.  No mass or inflammatory change. Spleen: Normal Adrenals/Urinary Tract: Adrenal glands are normal. Left kidney is  normal. Persistent and possibly worsened appearance of the midportion of the right kidney. This could represent a renal abscess or mass. The area of concern measures approximately 3 cm in size. The bladder is distended but otherwise unremarkable. Stomach/Bowel: Stomach and small intestine are unremarkable. No sign of bowel obstruction or inflammation. Vascular/Lymphatic: Aortic atherosclerosis. No aneurysm. IVC is normal. No adenopathy. Reproductive: No pelvic mass. Other: No free fluid or air. Musculoskeletal: Worsening of an acute compression fracture of L1. Loss of height of about 30%. Mild posterior bowing of the posterosuperior margin. No change in a acute to subacute compression fracture at L2. Degenerative changes of the spine below that. IMPRESSION: Worsening of an acute L1 compression fracture with loss of height of 30%. No change in an acute to subacute compression fracture at L2. Persistent and slightly worsened process in the right kidney which could represent inflammatory disease or mass lesion. Assuming the patient is not a good candidate or abdominal MRI, consider ultrasound for additional evaluation. No other solid organ or bowel pathology seen otherwise. Aortic Atherosclerosis (ICD10-I70.0). Electronically Signed   By: Paulina Fusi M.D.   On: 09/12/2021 20:09    Review of Systems  Constitutional:  Positive for fatigue. Negative for chills, fever and unexpected weight change.  HENT:  Negative for sinus pressure, sore throat and trouble swallowing.   Respiratory:  Negative for cough, chest tightness and shortness of breath.   Cardiovascular:  Negative for chest pain, palpitations and leg swelling.  Gastrointestinal:  Positive for abdominal pain. Negative for abdominal distention, blood in stool, constipation, diarrhea and nausea.  Genitourinary:  Negative for difficulty urinating, dysuria and frequency.  Musculoskeletal:  Positive for back pain. Negative for gait problem.  Neurological:   Negative for dizziness and syncope.  Psychiatric/Behavioral:  Positive for confusion and decreased concentration.    Blood pressure (!) 162/110, pulse 92, temperature 98 F (36.7 C), temperature source Oral, resp. rate (!) 26, height 5\' 7"  (1.702 m), weight 53 kg, SpO2 96 %. Physical Exam Constitutional:      Appearance: Normal appearance. She is normal weight.  HENT:     Head: Normocephalic and atraumatic.  Eyes:     Extraocular Movements: Extraocular movements intact.     Pupils: Pupils are equal, round, and reactive to light.  Cardiovascular:     Rate and Rhythm: Normal rate and regular rhythm.  Pulmonary:     Effort: Pulmonary effort is normal.     Breath sounds: Normal breath sounds.  Abdominal:     General: Abdomen is flat. Bowel sounds are normal. There is no distension.     Palpations: Abdomen is soft. There is no mass.     Tenderness: There is no abdominal tenderness. There is no guarding.  Musculoskeletal:        General: Normal range of motion.  Cervical back: Normal range of motion and neck supple.  Skin:    General: Skin is warm and dry.  Neurological:     General: No focal deficit present.     Mental Status: She is alert and oriented to person, place, and time.     Cranial Nerves: No cranial nerve deficit.     Motor: No weakness.  Psychiatric:        Mood and Affect: Mood normal.        Behavior: Behavior normal.        Thought Content: Thought content normal.        Judgment: Judgment normal.      Emeterio Reeve, DO 09/12/2021, 11:00 PM

## 2021-09-12 NOTE — ED Triage Notes (Signed)
PT ARRIVED from home confused over past couple days. Pt is confused upon triage, EMS started IV and gave a bolus NS 1043ml, no falls reported,

## 2021-09-12 NOTE — Assessment & Plan Note (Signed)
-  sinus on EKG in ED, no palpitations

## 2021-09-12 NOTE — Assessment & Plan Note (Addendum)
-  worsening based on imaging, stable on symptoms with no focal deficits.  Outpatient follow-up with neurosurgery postdischarge if desired

## 2021-09-12 NOTE — Assessment & Plan Note (Addendum)
Due to dehydration, appears dehydrated, ACE inhibitor and HCTZ held.  Defer management to nephrology.

## 2021-09-12 NOTE — ED Provider Notes (Signed)
[facility] EMERGENCY DEPARTMENT Provider Note   CSN: [id number] Arrival date & time: 09/12/21  1610     History  No chief complaint on file.   [name] is a 88 y.o. female.  Patient presents to ER due to concern of elevated blood sugar.  She has history of diabetes dependent on insulin.  However she could not get her glucometer to work while at home.  She told her doctors thought it was broken and she had a new one sent to her but she was not able to continue want to function correctly.  She has not been taking insulin for the past 4 to 5 days.  Husband states that she is been acting more confused and appeared dehydrated brought her to the ER.  She did complain of some left lower quadrant abdominal pain on review of systems.      Home Medications Prior to Admission medications   Medication Sig Start Date End Date Taking? Authorizing Provider  acetic acid 2 % otic solution Place 4 drops into both ears every 3 (three) hours. 04/22/21   Binnie Rail, MD  ALPRAZolam Duanne Moron) 0.5 MG tablet TAKE 1/2 TABLET TWICE DAILY AS NEEDED FOR ANXIETY OR SLEEP 06/30/21   Binnie Rail, MD  aspirin EC 81 MG tablet Take 81 mg by mouth daily.    [provider]  B Complex-C (SUPER B COMPLEX PO) Take 1 tablet by mouth daily.    [provider]  BD VEO INSULIN SYRINGE U/F 31G X 15/64" 0.3 ML MISC USE WITH INJECTIONS 07/28/21   Burns, Claudina Lick, MD  bisacodyl (DULCOLAX) 10 MG suppository Place 1 suppository (10 mg total) rectally as needed for moderate constipation. 04/27/19   Binnie Rail, MD  carboxymethylcellulose (REFRESH PLUS) 0.5 % SOLN Place 1 drop into both eyes 3 (three) times daily as needed. Non-preservative    [provider]  cholecalciferol (VITAMIN D) 1000 UNITS tablet Take 5,000 Units by mouth daily.     [provider]  Continuous Blood Gluc Sensor ([place]) MISC by Does not apply route. Sensor is located in  Right arm    [provider]  DUREZOL 0.05 % EMUL Place 1 drop into the right eye 3 (three) times daily. 04/01/21   [provider]  fish oil-omega-3 fatty acids 1000 MG capsule Take 2 g by mouth daily.    [provider]  glucose blood test strip OneTouch Ultra Blue Test Strip    [provider]  hydrochlorothiazide (HYDRODIURIL) 25 MG tablet TAKE (1) TABLET DAILY AS NEEDED. 07/30/21   Burns, Claudina Lick, MD  insulin aspart (NOVOLOG FLEXPEN) 100 UNIT/ML FlexPen Novolog FlexPen U-100 Insulin aspart 100 unit/mL (3 mL) subcutaneous    [provider]  insulin glargine (LANTUS) 100 UNIT/ML Solostar Pen Inject 14 Units into the skin daily at 10 pm.  07/29/14   [provider]  insulin lispro (HUMALOG) 100 UNIT/ML KiwkPen Inject into the skin. Sliding scale before meal    [provider]  Insulin Pen Needle (BD PEN NEEDLE NANO 2ND GEN) 32G X 4 MM MISC INJECT AS DIRECTED FOUR TIMES A DAY 08/01/19   [provider]  levothyroxine (SYNTHROID) 112 MCG tablet  03/22/20   [provider]  losartan (COZAAR) 100 MG tablet TAKE 1 TABLET ONCE DAILY. 03/20/21   Binnie Rail, MD  methocarbamol (ROBAXIN) 500 MG tablet Take 1 tablet (500 mg total) by mouth at bedtime  as needed for muscle spasms. 04/22/21   Binnie Rail, MD  metoprolol succinate (TOPROL-XL) 25 MG 24 hr tablet TAKE 1/2 TABLET AS NEEDED FOR PALPITATIONS. 02/21/21   Evans Lance, MD  moxifloxacin (VIGAMOX) 0.5 % ophthalmic solution Place 1 drop into the right eye 4 (four) times daily. 04/01/21   [provider]  Multiple Vitamin (MULTIVITAMIN) tablet Take 1 tablet by mouth daily. For breast and bone health    [provider]  ondansetron (ZOFRAN) 4 MG tablet Take 1 tablet (4 mg total) by mouth every 8 (eight) hours as needed for nausea or vomiting. 06/30/21   Binnie Rail, MD  potassium chloride SA (KLOR-CON M) 20 MEQ tablet Take 1 tablet (20 mEq total) by  mouth 2 (two) times daily. 08/14/21   Horton, Barbette Hair, MD  prednisoLONE acetate (PRED FORTE) 1 % ophthalmic suspension prednisolone acetate 1 % eye drops,suspension  place 1 drop into the affected eye 4 times a day for 5 days.    [provider]  saccharomyces boulardii (FLORASTOR) 250 MG capsule Take 1 capsule (250 mg total) by mouth 2 (two) times daily. 04/04/19   Binnie Rail, MD  simvastatin (ZOCOR) 20 MG tablet TAKE ONE TABLET AT BEDTIME. 11/27/20   Burns, Claudina Lick, MD  traMADol (ULTRAM) 50 MG tablet Take 1 tablet (50 mg total) by mouth every 8 (eight) hours as needed for severe pain (lumbar compression fractures). 08/19/21   Binnie Rail, MD      Allergies    Eliquis [apixaban], Amlodipine, Augmentin [amoxicillin-pot clavulanate], Cortisone, Keflex [cephalexin], Omeprazole, Prednisone, Hydrocodone, and Latex    Review of Systems   Review of Systems  Constitutional:  Negative for fever.  HENT:  Negative for ear pain.   Eyes:  Negative for pain.  Respiratory:  Negative for cough.   Cardiovascular:  Negative for chest pain.  Gastrointestinal:  Positive for abdominal pain.  Genitourinary:  Negative for flank pain.  Musculoskeletal:  Negative for back pain.  Skin:  Negative for rash.  Neurological:  Negative for headaches.   Physical Exam Updated Vital Signs BP (!) 162/110    Pulse 92    Temp 98 F (36.7 C) (Oral)    Resp (!) 26    Ht 5\' 7"  (1.702 m)    Wt 53 kg    SpO2 96%    BMI 18.30 kg/m  Physical Exam Constitutional:      General: She is not in acute distress.    Appearance: Normal appearance.  HENT:     Head: Normocephalic.     Nose: Nose normal.  Eyes:     Extraocular Movements: Extraocular movements intact.  Cardiovascular:     Rate and Rhythm: Normal rate.  Pulmonary:     Effort: Pulmonary effort is normal.  Abdominal:     Tenderness: There is no abdominal tenderness. There is no guarding or rebound.  Musculoskeletal:        General: Normal range of  motion.     Cervical back: Normal range of motion.  Neurological:     General: No focal deficit present.     Mental Status: She is alert. Mental status is at baseline.    ED Results / Procedures / Treatments   Labs (all labs ordered are listed, but only abnormal results are displayed) Labs Reviewed  CBC WITH DIFFERENTIAL/PLATELET - Abnormal; Notable for the following components:      Result Value   Lymphs Abs 0.5 (*)  All other components within normal limits  COMPREHENSIVE METABOLIC PANEL - Abnormal; Notable for the following components:   Sodium 125 (*)    Chloride 88 (*)    CO2 15 (*)    Glucose, Bld 469 (*)    Creatinine, Ser 1.26 (*)    Total Bilirubin 1.9 (*)    GFR, Estimated 41 (*)    Anion gap 22 (*)    All other components within normal limits  URINALYSIS, ROUTINE W REFLEX MICROSCOPIC - Abnormal; Notable for the following components:   Glucose, UA >=500 (*)    Hgb urine dipstick LARGE (*)    Ketones, ur >80 (*)    All other components within normal limits  URINALYSIS, MICROSCOPIC (REFLEX) - Abnormal; Notable for the following components:   Bacteria, UA RARE (*)    Non Squamous Epithelial PRESENT (*)    All other components within normal limits  CBG MONITORING, ED - Abnormal; Notable for the following components:   Glucose-Capillary 458 (*)    All other components within normal limits  RESP PANEL BY RT-PCR (FLU A&B, COVID) [id number]    EKG EKG Interpretation  Date/Time:  Friday September 12 2021 16:19:17 EST Ventricular Rate:  95 PR Interval:  204 QRS Duration: 88 QT Interval:  421 QTC Calculation: 533 R Axis:   60 Text Interpretation: Sinus rhythm Probable left atrial enlargement Anteroseptal infarct, old Minimal ST depression, diffuse leads Prolonged QT interval Confirmed by Thamas Jaegers ([id number]) on 09/12/2021 4:26:05 PM  Radiology No results found.  Procedures .Critical Care Performed by: Luna Fuse, MD Authorized by: Luna Fuse, MD   Critical  care provider statement:    Critical care time (minutes):  30   Critical care time was exclusive of:  Separately billable procedures and treating other patients and teaching time   Critical care was necessary to treat or prevent imminent or life-threatening deterioration of the following conditions:  Metabolic crisis Comments:     DKA    Medications Ordered in ED Medications  sodium chloride 0.9 % bolus 1,000 mL (1,000 mLs Intravenous New Bag/Given 09/12/21 1914)  insulin aspart (novoLOG) injection 12 Units (12 Units Subcutaneous Given 09/12/21 1911)    ED Course/ Medical Decision Making/ A&P                           Medical Decision Making Amount and/or Complexity of Data Reviewed Labs: ordered. Radiology: ordered.  Risk Prescription drug management.   History obtained from patient and her husband at bedside.  Review of records show that she saw outpatient clinic physicians for compression fracture September 10, 2021.  Work-up today concerning for elevated blood sugar greater than 400, anion gap of 22 and bicarb of 15.  Concern for DKA.  Given her age she was given a liter bolus of fluids and insulin.  CT abdomen pelvis also ordered although she is has no tenderness on exam she did complain of left lower quadrant pain and imaging ordered pending final result.  Hospitalist consulted for admission.        Final Clinical Impression(s) / ED Diagnoses Final diagnoses:  Diabetic ketoacidosis without coma associated with type 1 diabetes mellitus [facility])    Rx / DC Orders ED Discharge Orders     None         Luna Fuse, MD 09/12/21 1941

## 2021-09-12 NOTE — Assessment & Plan Note (Addendum)
Per PCP last note, following w/ urology outpatient, continue follow-up outpatient post discharge Nothing on UA/mico now to concern for UTI/pyelo/abscess

## 2021-09-12 NOTE — Assessment & Plan Note (Addendum)
Extremely elevated TSH levels due to noncompliance with Synthroid, counseled, with home dose Synthroid her TSH is serially coming down, surprisingly she does not seem to be in myxedema coma.  Will monitor closely. Avoiding IV Synthroid due to underlying Afib and relative no Hypothyroid symptoms, TSH coming down.

## 2021-09-12 NOTE — Assessment & Plan Note (Addendum)
DKA has resolved has been placed on Lantus along with sliding scale, question compliance at home.  Counseled.  Diabetic and insulin education.  Monitor CBGs.  Lab Results  Component Value Date   HGBA1C 6.9 (H) 09/13/2021   CBG (last 3)  Recent Labs    09/20/21 1940 09/21/21 0755 09/21/21 1210  GLUCAP 148* 195* 206*

## 2021-09-12 NOTE — Assessment & Plan Note (Signed)
-  IV fluids -trend AM labs

## 2021-09-13 DIAGNOSIS — E11649 Type 2 diabetes mellitus with hypoglycemia without coma: Secondary | ICD-10-CM | POA: Diagnosis not present

## 2021-09-13 DIAGNOSIS — T383X6A Underdosing of insulin and oral hypoglycemic [antidiabetic] drugs, initial encounter: Secondary | ICD-10-CM | POA: Diagnosis present

## 2021-09-13 DIAGNOSIS — I10 Essential (primary) hypertension: Secondary | ICD-10-CM | POA: Diagnosis present

## 2021-09-13 DIAGNOSIS — E7800 Pure hypercholesterolemia, unspecified: Secondary | ICD-10-CM | POA: Diagnosis present

## 2021-09-13 DIAGNOSIS — S32020D Wedge compression fracture of second lumbar vertebra, subsequent encounter for fracture with routine healing: Secondary | ICD-10-CM | POA: Diagnosis not present

## 2021-09-13 DIAGNOSIS — L89151 Pressure ulcer of sacral region, stage 1: Secondary | ICD-10-CM | POA: Diagnosis not present

## 2021-09-13 DIAGNOSIS — R310 Gross hematuria: Secondary | ICD-10-CM | POA: Diagnosis not present

## 2021-09-13 DIAGNOSIS — E1110 Type 2 diabetes mellitus with ketoacidosis without coma: Secondary | ICD-10-CM | POA: Diagnosis present

## 2021-09-13 DIAGNOSIS — E1165 Type 2 diabetes mellitus with hyperglycemia: Secondary | ICD-10-CM | POA: Diagnosis not present

## 2021-09-13 DIAGNOSIS — N950 Postmenopausal bleeding: Secondary | ICD-10-CM | POA: Diagnosis not present

## 2021-09-13 DIAGNOSIS — R4182 Altered mental status, unspecified: Secondary | ICD-10-CM | POA: Diagnosis not present

## 2021-09-13 DIAGNOSIS — I480 Paroxysmal atrial fibrillation: Secondary | ICD-10-CM | POA: Diagnosis present

## 2021-09-13 DIAGNOSIS — N2889 Other specified disorders of kidney and ureter: Secondary | ICD-10-CM | POA: Diagnosis present

## 2021-09-13 DIAGNOSIS — R946 Abnormal results of thyroid function studies: Secondary | ICD-10-CM | POA: Diagnosis present

## 2021-09-13 DIAGNOSIS — E1010 Type 1 diabetes mellitus with ketoacidosis without coma: Secondary | ICD-10-CM | POA: Diagnosis not present

## 2021-09-13 DIAGNOSIS — E860 Dehydration: Secondary | ICD-10-CM | POA: Diagnosis present

## 2021-09-13 DIAGNOSIS — R739 Hyperglycemia, unspecified: Secondary | ICD-10-CM | POA: Diagnosis present

## 2021-09-13 DIAGNOSIS — T502X5A Adverse effect of carbonic-anhydrase inhibitors, benzothiadiazides and other diuretics, initial encounter: Secondary | ICD-10-CM | POA: Diagnosis not present

## 2021-09-13 DIAGNOSIS — M47814 Spondylosis without myelopathy or radiculopathy, thoracic region: Secondary | ICD-10-CM | POA: Diagnosis present

## 2021-09-13 DIAGNOSIS — E871 Hypo-osmolality and hyponatremia: Secondary | ICD-10-CM | POA: Diagnosis not present

## 2021-09-13 DIAGNOSIS — E039 Hypothyroidism, unspecified: Secondary | ICD-10-CM | POA: Diagnosis present

## 2021-09-13 DIAGNOSIS — Z20822 Contact with and (suspected) exposure to covid-19: Secondary | ICD-10-CM | POA: Diagnosis present

## 2021-09-13 DIAGNOSIS — G9341 Metabolic encephalopathy: Secondary | ICD-10-CM | POA: Diagnosis present

## 2021-09-13 DIAGNOSIS — R309 Painful micturition, unspecified: Secondary | ICD-10-CM | POA: Diagnosis not present

## 2021-09-13 DIAGNOSIS — E861 Hypovolemia: Secondary | ICD-10-CM | POA: Diagnosis present

## 2021-09-13 DIAGNOSIS — M4856XA Collapsed vertebra, not elsewhere classified, lumbar region, initial encounter for fracture: Secondary | ICD-10-CM | POA: Diagnosis present

## 2021-09-13 DIAGNOSIS — N179 Acute kidney failure, unspecified: Secondary | ICD-10-CM | POA: Diagnosis present

## 2021-09-13 DIAGNOSIS — E876 Hypokalemia: Secondary | ICD-10-CM | POA: Diagnosis present

## 2021-09-13 DIAGNOSIS — K649 Unspecified hemorrhoids: Secondary | ICD-10-CM | POA: Diagnosis present

## 2021-09-13 DIAGNOSIS — Z9114 Patient's other noncompliance with medication regimen: Secondary | ICD-10-CM | POA: Diagnosis not present

## 2021-09-13 LAB — CBG MONITORING, ED
Glucose-Capillary: 235 mg/dL — ABNORMAL HIGH (ref 70–99)
Glucose-Capillary: 254 mg/dL — ABNORMAL HIGH (ref 70–99)
Glucose-Capillary: 201 mg/dL — ABNORMAL HIGH (ref 70–99)
Glucose-Capillary: 141 mg/dL — ABNORMAL HIGH (ref 70–99)
Glucose-Capillary: 124 mg/dL — ABNORMAL HIGH (ref 70–99)

## 2021-09-13 LAB — BASIC METABOLIC PANEL
Anion gap: 18 — ABNORMAL HIGH (ref 5–15)
Anion gap: 7 (ref 5–15)
BUN: 21 mg/dL (ref 8–23)
BUN: 17 mg/dL (ref 8–23)
CO2: 14 mmol/L — ABNORMAL LOW (ref 22–32)
CO2: 22 mmol/L (ref 22–32)
Calcium: 9.6 mg/dL (ref 8.9–10.3)
Calcium: 10 mg/dL (ref 8.9–10.3)
Chloride: 96 mmol/L — ABNORMAL LOW (ref 98–111)
Chloride: 100 mmol/L (ref 98–111)
Creatinine, Ser: 1.15 mg/dL — ABNORMAL HIGH (ref 0.44–1.00)
Creatinine, Ser: 0.91 mg/dL (ref 0.44–1.00)
GFR, Estimated: 45 mL/min — ABNORMAL LOW (ref >60)
GFR, Estimated: 60 mL/min — ABNORMAL LOW (ref >60)
Glucose, Bld: 302 mg/dL — ABNORMAL HIGH (ref 70–99)
Glucose, Bld: 141 mg/dL — ABNORMAL HIGH (ref 70–99)
Potassium: 3.4 mmol/L — ABNORMAL LOW (ref 3.5–5.1)
Potassium: 3.4 mmol/L — ABNORMAL LOW (ref 3.5–5.1)
Sodium: 128 mmol/L — ABNORMAL LOW (ref 135–145)
Sodium: 129 mmol/L — ABNORMAL LOW (ref 135–145)

## 2021-09-13 LAB — MAGNESIUM: Magnesium: 1.4 mg/dL — ABNORMAL LOW (ref 1.7–2.4)

## 2021-09-13 LAB — HEMOGLOBIN A1C
Hgb A1c MFr Bld: 6.9 % — ABNORMAL HIGH (ref 4.8–5.6)
Hgb A1c MFr Bld: 6.9 % — ABNORMAL HIGH (ref 4.8–5.6)
Mean Plasma Glucose: 151.33 mg/dL
Mean Plasma Glucose: 151.33 mg/dL

## 2021-09-13 LAB — BETA-HYDROXYBUTYRIC ACID
Beta-Hydroxybutyric Acid: 4.92 mmol/L — ABNORMAL HIGH (ref 0.05–0.27)
Beta-Hydroxybutyric Acid: 5.35 mmol/L — ABNORMAL HIGH (ref 0.05–0.27)

## 2021-09-13 LAB — GLUCOSE, CAPILLARY
Glucose-Capillary: 150 mg/dL — ABNORMAL HIGH (ref 70–99)
Glucose-Capillary: 159 mg/dL — ABNORMAL HIGH (ref 70–99)

## 2021-09-13 MED ORDER — TRAMADOL HCL 50 MG PO TABS
50.0000 mg | ORAL_TABLET | Freq: Three times a day (TID) | ORAL | Status: DC | PRN
  Administered 2021-09-17: 50 mg via ORAL
  Filled 2021-09-13: qty 1

## 2021-09-13 MED ORDER — POTASSIUM CHLORIDE CRYS ER 20 MEQ PO TBCR
40.0000 meq | EXTENDED_RELEASE_TABLET | Freq: Every day | ORAL | Status: DC
  Administered 2021-09-13 – 2021-09-14 (×2): 40 meq via ORAL
  Filled 2021-09-13 (×2): qty 2

## 2021-09-13 MED ORDER — ALPRAZOLAM 0.25 MG PO TABS
0.2500 mg | ORAL_TABLET | Freq: Two times a day (BID) | ORAL | Status: DC | PRN
  Administered 2021-09-13: 0.25 mg via ORAL
  Filled 2021-09-13: qty 1

## 2021-09-13 MED ORDER — INSULIN GLARGINE-YFGN 100 UNIT/ML ~~LOC~~ SOLN
14.0000 [IU] | Freq: Every day | SUBCUTANEOUS | Status: DC
  Administered 2021-09-13: 14 [IU] via SUBCUTANEOUS
  Filled 2021-09-13 (×2): qty 0.14

## 2021-09-13 MED ORDER — DEXTROSE IN LACTATED RINGERS 5 % IV SOLN: INTRAVENOUS | Status: DC

## 2021-09-13 MED ORDER — DEXTROSE 50 % IV SOLN
0.0000 mL | INTRAVENOUS | Status: DC | PRN
  Filled 2021-09-13: qty 50

## 2021-09-13 MED ORDER — LOSARTAN POTASSIUM 50 MG PO TABS
100.0000 mg | ORAL_TABLET | Freq: Every day | ORAL | Status: DC
  Administered 2021-09-13: 100 mg via ORAL
  Filled 2021-09-13: qty 2

## 2021-09-13 MED ORDER — INSULIN ASPART 100 UNIT/ML IJ SOLN: 0.0000 [IU] | Freq: Three times a day (TID) | INTRAMUSCULAR | Status: DC

## 2021-09-13 MED ORDER — LACTATED RINGERS IV SOLN: INTRAVENOUS | Status: DC

## 2021-09-13 MED ORDER — LEVOTHYROXINE SODIUM 112 MCG PO TABS
112.0000 ug | ORAL_TABLET | Freq: Every day | ORAL | Status: DC
  Administered 2021-09-13 – 2021-09-24 (×12): 112 ug via ORAL
  Filled 2021-09-13 (×13): qty 1

## 2021-09-13 MED ORDER — ENOXAPARIN SODIUM 30 MG/0.3ML IJ SOSY
30.0000 mg | PREFILLED_SYRINGE | INTRAMUSCULAR | Status: DC
  Administered 2021-09-13: 30 mg via SUBCUTANEOUS
  Filled 2021-09-13 (×2): qty 0.3

## 2021-09-13 MED ORDER — METOPROLOL SUCCINATE ER 25 MG PO TB24: 12.5000 mg | ORAL_TABLET | Freq: Every day | ORAL | Status: DC | PRN

## 2021-09-13 MED ORDER — MAGNESIUM SULFATE 2 GM/50ML IV SOLN
2.0000 g | Freq: Once | INTRAVENOUS | Status: AC
  Administered 2021-09-13: 2 g via INTRAVENOUS
  Filled 2021-09-13: qty 50

## 2021-09-13 MED ORDER — SIMVASTATIN 20 MG PO TABS
20.0000 mg | ORAL_TABLET | Freq: Every day | ORAL | Status: DC
  Administered 2021-09-13 – 2021-09-22 (×11): 20 mg via ORAL
  Filled 2021-09-13 (×14): qty 1

## 2021-09-13 MED ORDER — INSULIN ASPART 100 UNIT/ML IJ SOLN
0.0000 [IU] | INTRAMUSCULAR | Status: DC
  Administered 2021-09-13: 8 [IU] via SUBCUTANEOUS
  Administered 2021-09-13: 2 [IU] via SUBCUTANEOUS
  Administered 2021-09-13: 5 [IU] via SUBCUTANEOUS
  Administered 2021-09-13: 3 [IU] via SUBCUTANEOUS
  Administered 2021-09-13: 2 [IU] via SUBCUTANEOUS
  Administered 2021-09-13: 5 [IU] via SUBCUTANEOUS

## 2021-09-13 MED ORDER — POTASSIUM CHLORIDE CRYS ER 20 MEQ PO TBCR
20.0000 meq | EXTENDED_RELEASE_TABLET | Freq: Once | ORAL | Status: AC
  Administered 2021-09-13: 20 meq via ORAL
  Filled 2021-09-13: qty 1

## 2021-09-13 MED ORDER — INSULIN GLARGINE-YFGN 100 UNIT/ML ~~LOC~~ SOLN
10.0000 [IU] | Freq: Every day | SUBCUTANEOUS | Status: DC
  Administered 2021-09-13: 10 [IU] via SUBCUTANEOUS
  Filled 2021-09-13: qty 0.1

## 2021-09-13 MED ORDER — METHOCARBAMOL 500 MG PO TABS
500.0000 mg | ORAL_TABLET | Freq: Every evening | ORAL | Status: DC | PRN
  Administered 2021-09-15 – 2021-09-16 (×2): 500 mg via ORAL
  Filled 2021-09-13 (×3): qty 1

## 2021-09-13 MED ORDER — INSULIN GLARGINE-YFGN 100 UNIT/ML ~~LOC~~ SOLN: 7.0000 [IU] | Freq: Every day | SUBCUTANEOUS | Status: DC

## 2021-09-13 NOTE — Progress Notes (Signed)
NEW ADMISSION NOTE New Admission Note:   Arrival Method: stretcher Mental Orientation: A&OX3 Telemetry: N/A Assessment: Completed Skin: intact, bruising on arms and legs, buttocks red  IV: RFA Pain: 0/10 Tubes: R(arm) continuous glucose monitor Safety Measures: Safety Fall Prevention Plan has been given, discussed and signed Admission: Completed [facility] Orientation: Patient has been orientated to the room, unit and staff.  Family: husband and daughter at bedside  Orders have been reviewed and implemented. Will continue to monitor the patient. Call light has been placed within reach and bed alarm has been activated.   Dinnis Rog S Laberta Wilbon, RN

## 2021-09-13 NOTE — Progress Notes (Signed)
Pt very confused tonight.  Thinks the doctors "are trying to take me out".  Attempts to reorient and reassure pt were fruitless.  I was able to get in touch with pt's daughter Georganna Skeans ([phone]) and pt was much calmer after speaking with her.  Pt finally took her HS meds and Semglee after much discussion.    Call made to Dr. Hal Hope to clarify IVF order.  BMP results also relayed.  Potassium and Magnesium replaced.  Also placed pt on enteric precautions due to 2 loose watery stools today.  Will attempt to collect stool specimen.   Ayesha Mohair BSN RN CMSRN 09/13/2021, 11:34 PM

## 2021-09-13 NOTE — Progress Notes (Signed)
Inpatient Diabetes Program Recommendations  AACE/ADA: New Consensus Statement on Inpatient Glycemic Control (2015)  Target Ranges:  Prepandial:   less than 140 mg/dL      Peak postprandial:   less than 180 mg/dL (1-2 hours)      Critically ill patients:  140 - 180 mg/dL   Lab Results  Component Value Date   GLUCAP 201 (H) 09/13/2021   HGBA1C 6.9 (H) 09/13/2021    Review of Glycemic Control  Diabetes history: DM2 Outpatient Diabetes medications: Semglee 14 units QHS, Humalog 0-12 units TID Current orders for Inpatient glycemic control: Semglee 14 units Q10PM, Novolog 0-15 Q4H  Inpatient Diabetes Program Recommendations:    Semglee 7 units Q10PM (50% of home dose) Novolog 0-9 units TID   Will continue to follow while inpatient.  Thank you, Reche Dixon, MSN, RN Diabetes Coordinator Inpatient Diabetes Program [phone] (team pager from 8a-5p)

## 2021-09-13 NOTE — ED Notes (Signed)
Pt had trouble getting potassium pills down.  If more is required, powder in water would prob be best.

## 2021-09-13 NOTE — Progress Notes (Addendum)
PROGRESS NOTE    [name]  XFG:[id number] DOB: 1928/09/03 DOA: 09/12/2021 PCP: Binnie Rail, MD   Brief Narrative:  Patient is 88 year old female with past medical history of diabetes, hypertension, hypothyroidism, A. fib  presented to ED in the afternoon with chief complaint of confusion and hyperglycemia over the past couple of days.  Patient typically takes sliding scale NovoLog and 14 units Lantus nightly, has not been taking her insulin over the past 4-5 days since her home glucometer was not functioning and she was not able to obtain a new one.  Blood pressure was elevated up to 162/110 on initial ED evaluation, patient was also complaining of some left lower quadrant abdominal pain though exam was normal.  Labs demonstrated pseudohyponatremia, corrected sodium is within normal range, AKI with creatinine 1.26, her baseline over the past year or creatinine about 1.74-0.83, glucose to 469, glucose on UA greater than 500, ketones on urine greater than 80, UA also showed epithelial cells, no WBC.  EKG was normal sinus rhythm.  Patient was given bolus normal saline, NovoLog 12 units subq.  Patient admitted for further evaluation and management.  Assessment & Plan:  Hyperglycemia due to type 2 diabetes mellitus (HCC) - nonadherence to home therapy d/t glucometer malfunction, assoc w/ ketosis -(+)urine ketones, elevated beta hydroxybutyric acid -Anion gap: Trended down from 22- 18.  Continue IV fluids -Continue sliding scale insulin.  Added Semglee 10 units units nightly and NovoLog 0 to 9 units 3 times daily-appreciate diabetes coordinators help -A1c: 6.9%. -Monitor blood sugar closely.  Avoid hypoglycemia.  Hyponatremia: -Likely secondary to hyperglycemia. -Corrected sodium level 131 -Monitor closely.  Hypokalemia: Potassium 3.4.  Replenished.  Check magnesium level.  Repeat BMP tomorrow a.m.   Hypertension: BP stable -Hold losartan and HCTZ due to AKI.  Continue metoprolol.  Ge    Hypothyroidism -continue home dose synthroid   Compression fracture of L2 (HCC) -worsening based on imaging -pain control, outpatient follow-up -PT evaluation   History of Atrial fibrillation (HCC) - currently sinus rhythm -sinus on EKG in ED, no palpitations.  Not on anticoagulation at home. -Continue metoprolol   Right renal mass -Per PCP last note, following w/ urology outpatient  -Nothing on UA/mico now to concern for UTI/pyelo/abscess   AKI (acute kidney injury) ([place]) -IV fluids.  Hold nephrotoxic medication.  Repeat BMP tomorrow a.m.  Hyperlipidemia: Continue Zocor  DVT prophylaxis: Lovenox Code Status: Full code Family Communication: Patient's husband  present at bedside.  Plan of care discussed with patient in length and she verbalized understanding and agreed with it. Disposition Plan: Home Consultants:  None  Procedures:  None  Antimicrobials:  None  Status is: Inpatient    Subjective: Patient seen and examined.  Sitting comfortably on the bed.  Husband at the bedside.  Patient reports that she feels better however she continues to have nausea.  No vomiting, diarrhea, abdominal pain, dysuria or urgency or frequency.  No fever or chills.  Objective: Vitals:   09/13/21 1030 09/13/21 1045 09/13/21 1100 09/13/21 1115  BP: (!) 120/53 (!) 130/52 121/63 (!) 113/55  Pulse: 76 75 76 76  Resp: 17 15 18  (!) 22  Temp:      TempSrc:      SpO2: 93% 94% 93% 96%  Weight:      Height:        Intake/Output Summary (Last 24 hours) at 09/13/2021 1258 Last data filed at 09/13/2021 0205 Gross per 24 hour  Intake 1000 ml  Output --  Net 1000 ml   Filed Weights   09/12/21 1614  Weight: 53 kg    Examination:  General exam: Appears calm and comfortable, elderly female, on room air, communicating well, appears dehydrated Respiratory system: Clear to auscultation. Respiratory effort normal. Cardiovascular system: S1 & S2 heard, RRR. No JVD, murmurs, rubs, gallops or  clicks. No pedal edema. Gastrointestinal system: Abdomen is nondistended, soft and nontender. No organomegaly or masses felt. Normal bowel sounds heard. Central nervous system: Alert and oriented. No focal neurological deficits. Extremities: Symmetric 5 x 5 power. Skin: No rashes, lesions or ulcers Psychiatry: Judgement and insight appear normal. Mood & affect appropriate.    Data Reviewed: I have personally reviewed following labs and imaging studies  CBC: Recent Labs  Lab 09/12/21 1632  WBC 7.1  NEUTROABS 6.0  HGB 12.9  HCT 38.2  MCV 91.0  PLT 614   Basic Metabolic Panel: Recent Labs  Lab 09/12/21 1632 09/13/21 0304  NA 125* 128*  K 4.1 3.4*  CL 88* 96*  CO2 15* 14*  GLUCOSE 469* 302*  BUN 22 21  CREATININE 1.26* 1.15*  CALCIUM 9.7 9.6   GFR: Estimated Creatinine Clearance: 27.2 mL/min (A) (by C-G formula based on SCr of 1.15 mg/dL (H)). Liver Function Tests: Recent Labs  Lab 09/12/21 1632  AST 30  ALT 16  ALKPHOS 89  BILITOT 1.9*  PROT 7.5  ALBUMIN 3.9   No results for input(s): LIPASE, AMYLASE in the last 168 hours. No results for input(s): AMMONIA in the last 168 hours. Coagulation Profile: No results for input(s): INR, PROTIME in the last 168 hours. Cardiac Enzymes: No results for input(s): CKTOTAL, CKMB, CKMBINDEX, TROPONINI in the last 168 hours. BNP (last 3 results) No results for input(s): PROBNP in the last 8760 hours. HbA1C: Recent Labs    09/13/21 0100  HGBA1C 6.9*   CBG: Recent Labs  Lab 09/12/21 2125 09/13/21 0134 09/13/21 0355 09/13/21 0807 09/13/21 1223  GLUCAP 336* 235* 254* 201* 141*   Lipid Profile: No results for input(s): CHOL, HDL, LDLCALC, TRIG, CHOLHDL, LDLDIRECT in the last 72 hours. Thyroid Function Tests: No results for input(s): TSH, T4TOTAL, FREET4, T3FREE, THYROIDAB in the last 72 hours. Anemia Panel: No results for input(s): VITAMINB12, FOLATE, FERRITIN, TIBC, IRON, RETICCTPCT in the last 72 hours. Sepsis  Labs: No results for input(s): PROCALCITON, LATICACIDVEN in the last 168 hours.  Recent Results (from the past 240 hour(s))  Resp Panel by RT-PCR (Flu A&B, Covid) Nasopharyngeal Swab     Status: None   Collection Time: 09/12/21  7:24 PM   Specimen: Nasopharyngeal Swab; Nasopharyngeal(NP) swabs in vial transport medium  Result Value Ref Range Status   SARS Coronavirus 2 by RT PCR NEGATIVE NEGATIVE Final    Comment: (NOTE) SARS-CoV-2 target nucleic acids are NOT DETECTED.  The SARS-CoV-2 RNA is generally detectable in upper respiratory specimens during the acute phase of infection. The lowest concentration of SARS-CoV-2 viral copies this assay can detect is 138 copies/mL. A negative result does not preclude SARS-Cov-2 infection and should not be used as the sole basis for treatment or other patient management decisions. A negative result may occur with  improper specimen collection/handling, submission of specimen other than nasopharyngeal swab, presence of viral mutation(s) within the areas targeted by this assay, and inadequate number of viral copies(<138 copies/mL). A negative result must be combined with clinical observations, patient history, and epidemiological information. The expected result is Negative.  Fact Sheet for Patients:  EntrepreneurPulse.com.au  Fact Sheet for  Healthcare Providers:  IncredibleEmployment.be  This test is no t yet approved or cleared by the [place] and  has been authorized for detection and/or diagnosis of SARS-CoV-2 by FDA under an Emergency Use Authorization (EUA). This EUA will remain  in effect (meaning this test can be used) for the duration of the COVID-19 declaration under Section 564(b)(1) of the Act, 21 U.S.C.section 360bbb-3(b)(1), unless the authorization is terminated  or revoked sooner.       Influenza A by PCR NEGATIVE NEGATIVE Final   Influenza B by PCR NEGATIVE NEGATIVE Final     Comment: (NOTE) The Xpert Xpress SARS-CoV-2/FLU/RSV plus assay is intended as an aid in the diagnosis of influenza from Nasopharyngeal swab specimens and should not be used as a sole basis for treatment. Nasal washings and aspirates are unacceptable for Xpert Xpress SARS-CoV-2/FLU/RSV testing.  Fact Sheet for Patients: EntrepreneurPulse.com.au  Fact Sheet for Healthcare Providers: IncredibleEmployment.be  This test is not yet approved or cleared by the [place] FDA and has been authorized for detection and/or diagnosis of SARS-CoV-2 by FDA under an Emergency Use Authorization (EUA). This EUA will remain in effect (meaning this test can be used) for the duration of the COVID-19 declaration under Section 564(b)(1) of the Act, 21 U.S.C. section 360bbb-3(b)(1), unless the authorization is terminated or revoked.  Performed at [facility], [place] [address], [place], [place] [address]       Radiology Studies: CT Abdomen Pelvis W Contrast  Result Date: 09/12/2021 CLINICAL DATA:  Abdominal pain, acute, nonlocalized.  Diabetes. EXAM: CT ABDOMEN AND PELVIS WITH CONTRAST TECHNIQUE: Multidetector CT imaging of the abdomen and pelvis was performed using the standard protocol following bolus administration of intravenous contrast. RADIATION DOSE REDUCTION: This exam was performed according to the departmental dose-optimization program which includes automated exposure control, adjustment of the mA and/or kV according to patient size and/or use of iterative reconstruction technique. CONTRAST:  65mL OMNIPAQUE IOHEXOL 300 MG/ML  SOLN COMPARISON:  08/13/2021 FINDINGS: Lower chest: Lung bases are clear.  The heart is not enlarged. Hepatobiliary: No liver parenchymal abnormality is seen. No calcified gallstones. Pancreas: Atrophy of the pancreas.  No mass or inflammatory change. Spleen: Normal Adrenals/Urinary Tract: Adrenal glands are normal. Left kidney is  normal. Persistent and possibly worsened appearance of the midportion of the right kidney. This could represent a renal abscess or mass. The area of concern measures approximately 3 cm in size. The bladder is distended but otherwise unremarkable. Stomach/Bowel: Stomach and small intestine are unremarkable. No sign of bowel obstruction or inflammation. Vascular/Lymphatic: Aortic atherosclerosis. No aneurysm. IVC is normal. No adenopathy. Reproductive: No pelvic mass. Other: No free fluid or air. Musculoskeletal: Worsening of an acute compression fracture of L1. Loss of height of about 30%. Mild posterior bowing of the posterosuperior margin. No change in a acute to subacute compression fracture at L2. Degenerative changes of the spine below that. IMPRESSION: Worsening of an acute L1 compression fracture with loss of height of 30%. No change in an acute to subacute compression fracture at L2. Persistent and slightly worsened process in the right kidney which could represent inflammatory disease or mass lesion. Assuming the patient is not a good candidate or abdominal MRI, consider ultrasound for additional evaluation. No other solid organ or bowel pathology seen otherwise. Aortic Atherosclerosis (ICD10-I70.0). Electronically Signed   By: Nelson Chimes M.D.   On: 09/12/2021 20:09    Scheduled Meds:  enoxaparin (LOVENOX) injection  30 mg Subcutaneous Q24H   insulin  aspart  0-15 Units Subcutaneous Q4H   insulin aspart  0-9 Units Subcutaneous TID WC   insulin glargine-yfgn  14 Units Subcutaneous Q2200   insulin glargine-yfgn  7 Units Subcutaneous QHS   levothyroxine  112 mcg Oral Q0600   losartan  100 mg Oral Daily   simvastatin  20 mg Oral QHS   Continuous Infusions:  dextrose 5% lactated ringers 125 mL/hr at 09/13/21 0930   lactated ringers       LOS: 0 days   Time spent: 35 minutes   Brynja Marker Loann Quill, MD [facility]  If 7PM-7AM, please contact night-coverage [email or web address] 09/13/2021, 12:58  PM

## 2021-09-14 DIAGNOSIS — E1165 Type 2 diabetes mellitus with hyperglycemia: Secondary | ICD-10-CM | POA: Diagnosis not present

## 2021-09-14 LAB — C DIFFICILE QUICK SCREEN W PCR REFLEX
C Diff antigen: NEGATIVE
C Diff interpretation: NOT DETECTED
C Diff toxin: NEGATIVE

## 2021-09-14 LAB — GLUCOSE, CAPILLARY
Glucose-Capillary: 103 mg/dL — ABNORMAL HIGH (ref 70–99)
Glucose-Capillary: 120 mg/dL — ABNORMAL HIGH (ref 70–99)
Glucose-Capillary: 129 mg/dL — ABNORMAL HIGH (ref 70–99)
Glucose-Capillary: 138 mg/dL — ABNORMAL HIGH (ref 70–99)
Glucose-Capillary: 150 mg/dL — ABNORMAL HIGH (ref 70–99)
Glucose-Capillary: 152 mg/dL — ABNORMAL HIGH (ref 70–99)
Glucose-Capillary: 206 mg/dL — ABNORMAL HIGH (ref 70–99)
Glucose-Capillary: 208 mg/dL — ABNORMAL HIGH (ref 70–99)
Glucose-Capillary: 256 mg/dL — ABNORMAL HIGH (ref 70–99)
Glucose-Capillary: 384 mg/dL — ABNORMAL HIGH (ref 70–99)
Glucose-Capillary: 71 mg/dL (ref 70–99)
Glucose-Capillary: 91 mg/dL (ref 70–99)

## 2021-09-14 LAB — BASIC METABOLIC PANEL
Anion gap: 8 (ref 5–15)
BUN: 15 mg/dL (ref 8–23)
CO2: 23 mmol/L (ref 22–32)
Calcium: 9.4 mg/dL (ref 8.9–10.3)
Chloride: 100 mmol/L (ref 98–111)
Creatinine, Ser: 0.89 mg/dL (ref 0.44–1.00)
GFR, Estimated: >60 mL/min (ref >60)
Glucose, Bld: 154 mg/dL — ABNORMAL HIGH (ref 70–99)
Potassium: 3.1 mmol/L — ABNORMAL LOW (ref 3.5–5.1)
Sodium: 131 mmol/L — ABNORMAL LOW (ref 135–145)

## 2021-09-14 LAB — MAGNESIUM: Magnesium: 2.1 mg/dL (ref 1.7–2.4)

## 2021-09-14 MED ORDER — INSULIN GLARGINE-YFGN 100 UNIT/ML ~~LOC~~ SOLN
7.0000 [IU] | Freq: Every day | SUBCUTANEOUS | Status: DC
  Filled 2021-09-14: qty 0.07

## 2021-09-14 MED ORDER — DEXTROSE IN LACTATED RINGERS 5 % IV SOLN: INTRAVENOUS | Status: DC

## 2021-09-14 MED ORDER — DEXTROSE 50 % IV SOLN
25.0000 mL | Freq: Once | INTRAVENOUS | Status: AC
  Administered 2021-09-14: 25 mL via INTRAVENOUS

## 2021-09-14 MED ORDER — INSULIN GLARGINE-YFGN 100 UNIT/ML ~~LOC~~ SOLN
7.0000 [IU] | Freq: Every day | SUBCUTANEOUS | Status: DC
  Administered 2021-09-14: 5 [IU] via SUBCUTANEOUS
  Administered 2021-09-15 – 2021-09-23 (×9): 7 [IU] via SUBCUTANEOUS
  Filled 2021-09-14 (×14): qty 0.07

## 2021-09-14 MED ORDER — INSULIN ASPART 100 UNIT/ML IJ SOLN: 0.0000 [IU] | Freq: Three times a day (TID) | INTRAMUSCULAR | Status: DC

## 2021-09-14 MED ORDER — POTASSIUM CHLORIDE CRYS ER 20 MEQ PO TBCR
40.0000 meq | EXTENDED_RELEASE_TABLET | Freq: Two times a day (BID) | ORAL | Status: DC
  Administered 2021-09-14 – 2021-09-21 (×13): 40 meq via ORAL
  Filled 2021-09-14 (×13): qty 2

## 2021-09-14 MED ORDER — INSULIN ASPART 100 UNIT/ML IJ SOLN
0.0000 [IU] | Freq: Three times a day (TID) | INTRAMUSCULAR | Status: DC
  Administered 2021-09-14: 15 [IU] via SUBCUTANEOUS
  Administered 2021-09-14: 5 [IU] via SUBCUTANEOUS
  Administered 2021-09-15: 3 [IU] via SUBCUTANEOUS
  Administered 2021-09-15: 11 [IU] via SUBCUTANEOUS
  Administered 2021-09-15: 2 [IU] via SUBCUTANEOUS
  Administered 2021-09-16: 5 [IU] via SUBCUTANEOUS

## 2021-09-14 MED ORDER — MELATONIN 3 MG PO TABS
3.0000 mg | ORAL_TABLET | Freq: Every day | ORAL | Status: DC
  Administered 2021-09-14 – 2021-09-21 (×8): 3 mg via ORAL
  Filled 2021-09-14 (×9): qty 1

## 2021-09-14 MED ORDER — INSULIN ASPART 100 UNIT/ML IJ SOLN: 0.0000 [IU] | Freq: Every day | INTRAMUSCULAR | Status: DC

## 2021-09-14 MED ORDER — ENOXAPARIN SODIUM 40 MG/0.4ML IJ SOSY
40.0000 mg | PREFILLED_SYRINGE | INTRAMUSCULAR | Status: DC
  Administered 2021-09-15 – 2021-09-17 (×3): 40 mg via SUBCUTANEOUS
  Filled 2021-09-14 (×3): qty 0.4

## 2021-09-14 NOTE — Evaluation (Signed)
Physical Therapy Evaluation Patient Details Name: [name] MRN: [id number] DOB: 1929-06-14 Today's Date: 09/14/2021  History of Present Illness  88 y/o female presented to ED on 09/12/21 for confusion and hyperglycemia. CT abdomen showed worsening acute compression fx of L1, worsening of R kidney mass. PMH: HTN, DM, hypothyroidism, Afib  Clinical Impression  Patient admitted with above findings. Patient presents with generalized weakness, impaired balance, impaired cognition, and decreased activity tolerance. Patient oriented to self this date and visibly anxious. Patient is a poor historian and unable to provide home information/PLOF. Patient required modA to stand from EOB and minA to complete step pivot transfer to recliner. Patient easily fatigued and required max encouragement to continue mobilizing to chair and had uncontrolled descent into chair. Patient will benefit from skilled PT services during acute stay to address listed deficits. At this time, recommend SNF at discharge unless family able to physically assist patient. Hopeful cognition improves to be able to progress mobility further as fear/anxiousness is limiting mobility.      Recommendations for follow up therapy are one component of a multi-disciplinary discharge planning process, led by the attending physician.  Recommendations may be updated based on patient status, additional functional criteria and insurance authorization.  Follow Up Recommendations Skilled nursing-short term rehab (<3 hours/day) (unless family able to provide 24 hour assistance, then HHPT; hopeful cognition improves)    Assistance Recommended at Discharge Frequent or constant Supervision/Assistance  Patient can return home with the following  A lot of help with walking and/or transfers;A little help with bathing/dressing/bathroom;Assistance with cooking/housework;Direct supervision/assist for medications management;Direct supervision/assist for financial  management;Help with stairs or ramp for entrance;Assist for transportation    Equipment Recommendations Other (comment) (TBD)  Recommendations for Other Services       Functional Status Assessment Patient has had a recent decline in their functional status and demonstrates the ability to make significant improvements in function in a reasonable and predictable amount of time.     Precautions / Restrictions Precautions Precautions: Fall Restrictions Weight Bearing Restrictions: No      Mobility  Bed Mobility               General bed mobility comments: sitting EOB on arrival    Transfers Overall transfer level: Needs assistance Equipment used: Rolling Elson Ulbrich (2 wheels) Transfers: Sit to/from Stand, Bed to chair/wheelchair/BSC Sit to Stand: Mod assist   Step pivot transfers: Min assist       General transfer comment: modA to stand from low bed surface and cues for hip extension due to trunk flexed. MinA to complete pivotal steps towards recliner with assist for balance and Rw management. Patient easily fatigued with uncontrolled descent into chair    Ambulation/Gait                  Stairs            Wheelchair Mobility    Modified Rankin (Stroke Patients Only)       Balance Overall balance assessment: Needs assistance Sitting-balance support: No upper extremity supported, Feet supported Sitting balance-Leahy Scale: Good     Standing balance support: Bilateral upper extremity supported, Reliant on assistive device for balance Standing balance-Leahy Scale: Poor Standing balance comment: reliant on UE support of RW                             Pertinent Vitals/Pain Pain Assessment Pain Assessment: Faces Faces Pain Scale: Hurts little more  Pain Location: L side of abdomen Pain Descriptors / Indicators: Grimacing Pain Intervention(s): Monitored during session, Repositioned    Home Living Family/patient expects to be discharged  to:: Private residence Living Arrangements: Spouse/significant other   Type of Home: House           Home Equipment: Conservation officer, nature (2 wheels) Additional Comments: patient unable to provide further information beyond what's listed due to confusion and visibly upset    Prior Function Prior Level of Function : Other (comment)             Mobility Comments: unsure but patient states she uses a RW at baseline       Hand Dominance        Extremity/Trunk Assessment   Upper Extremity Assessment Upper Extremity Assessment: Generalized weakness    Lower Extremity Assessment Lower Extremity Assessment: Generalized weakness    Cervical / Trunk Assessment Cervical / Trunk Assessment: Kyphotic  Communication   Communication: No difficulties  Cognition Arousal/Alertness: Awake/alert Behavior During Therapy: Anxious Overall Cognitive Status: Impaired/Different from baseline Area of Impairment: Orientation, Attention, Memory, Following commands, Safety/judgement, Awareness, Problem solving                 Orientation Level: Disoriented to, Place, Time, Situation Current Attention Level: Sustained Memory: Decreased short-term memory Following Commands: Follows one step commands with increased time Safety/Judgement: Decreased awareness of safety Awareness: Intellectual Problem Solving: Slow processing, Requires verbal cues General Comments: Oriented to self this session. Visibly upset and stating "I need to get in touch with Stacy, I think I did something bad to her." Attempts to reorient patient were unsuccessful. Slow processing noted throughout requiring verbal cues        General Comments      Exercises     Assessment/Plan    PT Assessment Patient needs continued PT services  PT Problem List Decreased strength;Decreased activity tolerance;Decreased balance;Decreased mobility;Decreased cognition;Decreased safety awareness       PT Treatment Interventions  DME instruction;Gait training;Functional mobility training;Balance training;Stair training;Therapeutic activities;Therapeutic exercise;Patient/family education    PT Goals (Current goals can be found in the Care Plan section)  Acute Rehab PT Goals Patient Stated Goal: did not state PT Goal Formulation: Patient unable to participate in goal setting Time For Goal Achievement: 09/28/21 Potential to Achieve Goals: Fair    Frequency Min 3X/week     Co-evaluation               AM-PAC PT "6 Clicks" Mobility  Outcome Measure Help needed turning from your back to your side while in a flat bed without using bedrails?: A Little Help needed moving from lying on your back to sitting on the side of a flat bed without using bedrails?: A Little Help needed moving to and from a bed to a chair (including a wheelchair)?: A Little Help needed standing up from a chair using your arms (e.g., wheelchair or bedside chair)?: A Lot Help needed to walk in hospital room?: A Lot Help needed climbing 3-5 steps with a railing? : A Lot 6 Click Score: 15    End of Session Equipment Utilized During Treatment: Gait belt Activity Tolerance: Patient tolerated treatment well Patient left: in chair;with call bell/phone within reach;with chair alarm set Nurse Communication: Mobility status PT Visit Diagnosis: Unsteadiness on feet (R26.81);Muscle weakness (generalized) (M62.81);Difficulty in walking, not elsewhere classified (R26.2)    Time: 7588-3254 PT Time Calculation (min) (ACUTE ONLY): 16 min   Charges:   PT Evaluation $PT Eval Moderate Complexity:  Marlinton Gilford Rile PT, DPT Acute Rehabilitation Services Pager [phone] Office [phone]   Linna Hoff 09/14/2021, 8:54 AM

## 2021-09-14 NOTE — Progress Notes (Signed)
Wasn't able to get stool sample mixed with urine

## 2021-09-14 NOTE — Progress Notes (Signed)
Inpatient Diabetes Program Recommendations  AACE/ADA: New Consensus Statement on Inpatient Glycemic Control (2015)  Target Ranges:  Prepandial:   less than 140 mg/dL      Peak postprandial:   less than 180 mg/dL (1-2 hours)      Critically ill patients:  140 - 180 mg/dL   Lab Results  Component Value Date   GLUCAP 150 (H) 09/14/2021   HGBA1C 6.9 (H) 09/13/2021    Review of Glycemic Control  Latest Reference Range & Units 09/14/21 03:11 09/14/21 04:12 09/14/21 06:07 09/14/21 07:55  Glucose-Capillary 70 - 99 mg/dL 120 (H) 91 129 (H) 150 (H)  (H): Data is abnormally high  Diabetes history: DM2 Outpatient Diabetes medications: Lantus 14 units QD, Humalog 0-12 units TID Current orders for Inpatient glycemic control: Semglee 10 units QD, Novolog 0-9 units TID  Attempted to speak with patient on the phone about DM management.  She is a bit confused and cannot remember exactly what she takes at home for her DM.  She does state she does not know where her glucometer is and has not been checking her CBGs.    Please order a Glucometer at DC.  Order # [id number]  Will continue to follow while inpatient.  Thank you, Reche Dixon, MSN, RN Diabetes Coordinator Inpatient Diabetes Program [phone] (team pager from 8a-5p)

## 2021-09-14 NOTE — Progress Notes (Signed)
Pt assisted to [facility] to void.  Attempted to collect stool, but unable to since she voided at the same time.  Stool is watery brown.  Mental status is more clear this AM.  Last glucose 129 at 0607 after changing IVF back to D5LR at 75/hr.  Glucose checks now q2h for now. Ayesha Mohair BSN RN [place] 09/14/2021, 6:28 AM

## 2021-09-14 NOTE — NC FL2 (Signed)
[place] LEVEL OF CARE SCREENING TOOL     IDENTIFICATION  Patient Name: [name] Birthdate: [birth year] Sex: female Admission Date (Current Location): 09/12/2021  [facility] and [place] Number:  Herbalist and Address:  [facility], [place] [address] [place], [place] [address]      Provider Number: [id number]  Attending Physician Name and Address:  Mckinley Jewel, MD  Relative Name and Phone Number:  Shandrell, Boda (Spouse)   [phone]    Current Level of Care: Hospital Recommended Level of Care: [place] Prior Approval Number:    Date Approved/Denied:   PASRR Number: [id number]  Discharge Plan: SNF    Current Diagnoses: Patient Active Problem List   Diagnosis Date Noted   Hyperglycemia due to diabetes mellitus ([place]) - nonadherence to home therapy d/t glucometer malfunction, assoc w/ ketosis 09/12/2021   AKI (acute kidney injury) ([place]) 09/12/2021   History of small bowel obstruction 08/19/2021   Menopause present 93/73/4287   Umbilical hernia 68/06/5725   Atrophy of vagina 08/19/2021   Compression fracture of L1 lumbar vertebra ([place]) 08/18/2021   Compression fracture of L2 ([place]) 08/18/2021   Black stools 08/13/2021   Right renal mass 06/30/2021   Lower back pain 06/30/2021   Acute cystitis 05/05/2021   Anxiety 12/31/2020   Pain in left foot 03/11/2020   Left leg swelling 03/05/2020   B12 deficiency 12/15/2019   Tingling in extremities 10/04/2019   Compression fracture of thoracic vertebra ([place]) 08/21/2019   Osteoporosis 08/21/2019   Chronic thoracic back pain 08/16/2019   History of Atrial fibrillation ([place]) - currently sinus rhythm 02/01/2018   Leg cramps 07/05/2017   Constipation 01/25/2017   Closed fracture of proximal end of right humerus 07/09/2016   Cough 06/17/2016   Abdominal pain 03/10/2016   Hearing loss 01/03/2016   Allergic rhinitis 01/03/2016   Minimal Coronary Plaque by  Cardiac Cath in 2013 09/11/2015   Frequent loose stools 08/28/2015   Altered bowel habits 09/26/2014   Hyponatremia    Acute mesenteric ischemia ([place])    Hypothyroidism 07/11/2012   Syncope 03/18/2012   Hypercholesteremia    Hypertension    Diabetes mellitus type 1 ([place])    Osteoarthritis    Thyroid nodule    Osteoporosis, post-menopausal    Malignant neoplasm of breast ([place]) 11/10/2011   Primary cancer of upper outer quadrant of left female breast ([place]) 08/21/2011    Orientation RESPIRATION BLADDER Height & Weight     Self, Place  Normal Continent Weight: 116 lb 13.5 oz (53 kg) Height:  5\' 7"  (170.2 cm)  BEHAVIORAL SYMPTOMS/MOOD NEUROLOGICAL BOWEL NUTRITION STATUS      Continent Diet (see d/c summary)  AMBULATORY STATUS COMMUNICATION OF NEEDS Skin   Limited Assist Verbally Normal                       Personal Care Assistance Level of Assistance  Bathing, Feeding, Dressing Bathing Assistance: Limited assistance Feeding assistance: Independent Dressing Assistance: Limited assistance     Functional Limitations Info  Sight, Hearing, Speech Sight Info: Adequate Hearing Info: Adequate Speech Info: Adequate    SPECIAL CARE FACTORS FREQUENCY  PT (By licensed PT), OT (By licensed OT)     PT Frequency: 5x/ week OT Frequency: 5x/ week            Contractures Contractures Info: Not present    Additional Factors Info  Code Status, Allergies, Insulin Sliding Scale  Code Status Info: Full Allergies Info: Eliquis (Apixaban)   Amlodipine   Augmentin (Amoxicillin-pot Clavulanate)   Cortisone   Keflex (Cephalexin)   Omeprazole   Prednisone   Hydrocodone   Latex   Insulin Sliding Scale Info: see d/c med list       Current Medications (09/14/2021):  This is the current hospital active medication list Current Facility-Administered Medications  Medication Dose Route Frequency Provider Last Rate Last Admin   dextrose 50 % solution 0-50 mL  0-50 mL Intravenous PRN  Emeterio Reeve, DO       [START ON 09/15/2021] enoxaparin (LOVENOX) injection 40 mg  40 mg Subcutaneous Q24H Pham, Minh Q, RPH-CPP       insulin aspart (novoLOG) injection 0-15 Units  0-15 Units Subcutaneous TID WC Pham, Minh Q, RPH-CPP   15 Units at 09/14/21 1442   insulin aspart (novoLOG) injection 0-5 Units  0-5 Units Subcutaneous QHS Pahwani, Rinka R, MD       insulin glargine-yfgn (SEMGLEE) injection 7 Units  7 Units Subcutaneous QHS Pham, Minh Q, RPH-CPP       lactated ringers infusion   Intravenous Continuous Pahwani, Rinka R, MD   Stopped at 09/13/21 2202   levothyroxine (SYNTHROID) tablet 112 mcg  112 mcg Oral Q0600 Emeterio Reeve, DO   112 mcg at 09/14/21 3790   melatonin tablet 3 mg  3 mg Oral QHS Pahwani, Rinka R, MD       methocarbamol (ROBAXIN) tablet 500 mg  500 mg Oral QHS PRN Emeterio Reeve, DO       metoprolol succinate (TOPROL-XL) 24 hr tablet 12.5 mg  12.5 mg Oral Daily PRN Pahwani, Rinka R, MD       potassium chloride SA (KLOR-CON M) CR tablet 40 mEq  40 mEq Oral BID Pahwani, Rinka R, MD       simvastatin (ZOCOR) tablet 20 mg  20 mg Oral QHS Emeterio Reeve, DO   20 mg at 09/13/21 2215   traMADol (ULTRAM) tablet 50 mg  50 mg Oral Q8H PRN Emeterio Reeve, DO         Discharge Medications: Please see discharge summary for a list of discharge medications.  Relevant Imaging Results:  Relevant Lab Results:   Additional Information SSN: [id number];  [place] COVID-19 Vaccine 12/06/2019 , 11/13/2019;  5'7" 116lbs  Paulene Floor Daniil Labarge, LCSWA

## 2021-09-14 NOTE — TOC Initial Note (Signed)
Transition of Care [facility]) - Initial/Assessment Note    Patient Details  Name: [name] MRN: [id number] Date of Birth: 11-03-1928  Transition of Care [name]) CM/SW Contact:    [name], [place] Phone Number: 09/14/2021, 2:42 PM  Clinical Narrative:                 CSW received consult for possible SNF placement at time of discharge. CSW spoke with patient.  Patient was pleasantly confused at times, but answered some questions appropriately.  Patient's husband was present and answered questions when patient became confused.  Patient and spouse expressed understanding of PT recommendation and is agreeable to SNF placement at time of discharge. Patient reports preference for [name]. CSW discussed insurance authorization process and will provide Medicare SNF ratings list. Patient has received  2 COVID vaccines. CSW will send out referrals for review.  No further questions reported at this time.   Skilled Nursing Rehab Facilities-   [email or web address]   Ratings out of 5 possible   Name Address  Phone # [place] Inspection Overall  [facility] [address], [place] 5 5 2 4   [facility]  [address], [place] [phone] 4 2 5 5   [facility] [place], [place] 4 1 1 1   [facility], [place] 2 2 4 4   [facility] [address] [place] 2 1 2 1   [place] [address], [place] 3 1 4 3   [facility] [address] [place] 5 2 2 3   [facility] [address] [place] 4 1 2 1   [address] ([place]) [place], [place] [phone] 5 1 2 2   [facility] [phone], [place] [phone] 4 1 1 1   [facility] [address] [facility] [phone] 4 1 2 1   [facility] ([place]) [place]. [place], [place] [phone] 4 1 1 1           [place] [address], [place] 4 2 3 3   [facility] [place] [address], [place] 3 1 5 4   [facility], [place], [address] [phone] 2 1 1 1   [facility] [address] [phone] 2 2 3 3           [address] (no [facility]) [place] [address], [place] [phone] 4 5 5 5   [facility] (No Humana) [address] [phone] [address], [place] 4 1 4 3   [facility] (No UHC) [place], [place] [phone] 5 5 5 5   [facility] [address], [place] [phone] 3 3 4 4   [place] [address] [place]  [phone] 2 2 2 2   [phone] [address] [place] 3 1 3 2   [place] [address], [place] 1 1 2 1   [place] [address] [place] 2 1 1 1   [facility] [place], [place] 5 2 4 5   [facility] [address] [place] 2 1 1 1   [facility] [address], [place] 3 1 1 1   [facility] [place], [place] 2 1 2 1           [facility] [place]  [address], [place] [phone] 5 3 3 4   [place] [address], [place] 2 1 1 1   [place] (No Humana) [address], [place] [phone] 2 1 2 1   [facility] [address] [place] [phone] 3 1 1 1           [facility] [place], [place] 4 1 5 4   [facility]) [place], [place] 2 1 3 2   [facility]) [place], [place] 3 1 4 3   [place] [address], [place] 4 1 4 3   [address] [place], [place] 3 3 1 1   [facility]) [address] [place] [phone] 3 2 3 3      Expected Discharge Plan: [place] Barriers to Discharge: Insurance Authorization, SNF Pending bed offer   Patient Goals and CMS Choice Patient states their goals for this hospitalization and ongoing recovery are:: To get home CMS Medicare.gov Compare Post Acute Care list provided to:: Patient Represenative (must comment) Choice offered to / list presented to : Spouse  Expected Discharge Plan and Services Expected Discharge Plan: [place]       Living arrangements for the past 2 months: Single Family Home                                      Prior Living Arrangements/Services Living arrangements for the past 2 months: Single Family Home Lives with:: Self, Spouse Patient language and need for interpreter reviewed:: Yes Do you feel safe going back to the place where you live?: Yes      Need for Family Participation in Patient Care: Yes (Comment) Care giver support system in place?: Yes (comment)   Criminal Activity/Legal Involvement Pertinent to Current Situation/Hospitalization: No - Comment as needed  Activities of Daily Living      Permission Sought/Granted   Permission granted to share information with : Yes, Release of Information Signed     Permission granted to share info w AGENCY: SNFs  Permission granted to share info w Relationship: Drudge,Paul C (Spouse)     Emotional Assessment Appearance:: Appears stated age Attitude/Demeanor/Rapport: Gracious Affect (typically observed): Calm Orientation: : Oriented to Self, Oriented to Place Alcohol / Substance Use: Not Applicable Psych Involvement: No (comment)  Admission diagnosis:  Diabetic ketoacidosis without coma associated with type 1 diabetes mellitus ([place]) [E10.10] Hyperglycemia due to diabetes mellitus ([place]) [E11.65] Patient Active Problem List    Diagnosis Date Noted   Hyperglycemia due to diabetes mellitus ([place]) - nonadherence to home therapy d/t glucometer malfunction, assoc w/ ketosis 09/12/2021   AKI (acute kidney injury) ([place]) 09/12/2021   History of small bowel obstruction 08/19/2021   Menopause present 96/28/3662   Umbilical hernia 94/76/5465   Atrophy of vagina 08/19/2021   Compression fracture of L1 lumbar vertebra ([place]) 08/18/2021   Compression fracture of L2 ([place]) 08/18/2021   Black stools 08/13/2021   Right renal mass 06/30/2021   Lower back pain 06/30/2021   Acute cystitis 05/05/2021   Anxiety 12/31/2020   Pain in left foot 03/11/2020   Left leg swelling 03/05/2020   B12 deficiency 12/15/2019   Tingling in extremities 10/04/2019   Compression fracture of thoracic vertebra ([place]) 08/21/2019   Osteoporosis 08/21/2019   Chronic  thoracic back pain 08/16/2019   History of Atrial fibrillation ([place]) - currently sinus rhythm 02/01/2018   Leg cramps 07/05/2017   Constipation 01/25/2017   Closed fracture of proximal end of right humerus 07/09/2016   Cough 06/17/2016   Abdominal pain 03/10/2016   Hearing loss 01/03/2016   Allergic rhinitis 01/03/2016   Minimal Coronary Plaque by Cardiac Cath in 2013 09/11/2015   Frequent loose stools 08/28/2015   Altered bowel habits 09/26/2014   Hyponatremia    Acute mesenteric ischemia ([place])    Hypothyroidism 07/11/2012   Syncope 03/18/2012   Hypercholesteremia    Hypertension    Diabetes mellitus type 1 ([place])    Osteoarthritis    Thyroid nodule    Osteoporosis, post-menopausal    Malignant neoplasm of breast ([place]) 11/10/2011   Primary cancer of upper outer quadrant of left female breast ([place]) 08/21/2011   PCP:  Binnie Rail, MD Pharmacy:   [place], [place] [address] Phone: [phone] Fax: [phone]  [id number] - [place], [address] AT [place] [address] [place] [address] Phone: [phone] Fax: [phone]     Social Determinants of Health (SDOH) Interventions    Readmission Risk Interventions No flowsheet data found.

## 2021-09-14 NOTE — Progress Notes (Signed)
Dr. Hal Hope made aware of glucose 71 at [place] check.  Pt awakened and was able to drink about 163ml of Ensure.  94ml of D50 given per order.  Recheck glucose was 152.  Will recheck glucose q1h x 3 as requested by MD.  Most recent glucose 138.   Ayesha Mohair BSN RN [place] 09/14/2021, 2:19 AM

## 2021-09-14 NOTE — Progress Notes (Signed)
PROGRESS NOTE    [name]  QTM:[id number] DOB: [birth year] DOA: 09/12/2021 PCP: Binnie Rail, MD   Brief Narrative:  Patient is 88 year old female with past medical history of diabetes, hypertension, hypothyroidism, A. fib  presented to ED in the afternoon with chief complaint of confusion and hyperglycemia over the past couple of days.  Patient typically takes sliding scale NovoLog and 14 units Lantus nightly, has not been taking her insulin over the past 4-5 days since her home glucometer was not functioning and she was not able to obtain a new one.  Blood pressure was elevated up to 162/110 on initial ED evaluation, patient was also complaining of some left lower quadrant abdominal pain though exam was normal.  Labs demonstrated pseudohyponatremia, corrected sodium is within normal range, AKI with creatinine 1.26, her baseline over the past year or creatinine about 1.74-0.83, glucose to 469, glucose on UA greater than 500, ketones on urine greater than 80, UA also showed epithelial cells, no WBC.  EKG was normal sinus rhythm.  Patient was given bolus normal saline, NovoLog 12 units subq.  Patient admitted for further evaluation and management.  Assessment & Plan:  Hyperglycemia due to type 2 diabetes mellitus (HCC) - nonadherence to home therapy d/t glucometer malfunction, assoc w/ ketosis -(+)urine ketones, elevated beta hydroxybutyric acid -Anion gap: closed.  Continue IV fluids -Continue sliding scale insulin.  -A1c: 6.9%. -patient had hypoglycemia last night therefore insulin orders were discont'd -BS >300-started on sliding scale insulin and Semglee 7 units nightly.  Monitor blood sugar closely and avoid hypoglycemia.  Delirium: -Patient confused last night.  Xanax discontinued.  Added melatonin daily for sleep -On delirium precautions  Loose stools: -Twice, watery brown stool.  C. difficile is ordered-not collected yet.  Hyponatremia: -Likely secondary to  hyperglycemia. -Improving. -Monitor closely.  Hypokalemia: Potassium 3.1.  Replenished.  Hypomagnesemia: Replenished.  Resolved  AKI: Resolved -continue gentle hydration.  Avoid nephrotoxic medications.  hypertension: BP stable -Continue metoprolol.  Continue to hold losartan and HCTZ at this time.  Hypothyroidism -continue home dose synthroid   Compression fracture of L2 ([place]) -worsening based on imaging -pain control, outpatient follow-up -PT evaluation-recommended SNF.  TOC consulted.   History of Atrial fibrillation (HCC) - currently sinus rhythm -sinus on EKG in ED, no palpitations.  Not on anticoagulation at home. -Continue metoprolol   Right renal mass -Per PCP last note, following w/ urology outpatient  -Nothing on UA/mico now to concern for UTI/pyelo/abscess  Hyperlipidemia: Continue Zocor  DVT prophylaxis: Lovenox Code Status: Full code Family Communication: Patient's daughter present at bedside.  Plan of care discussed with patient in length and she verbalized understanding and agreed with it. Disposition Plan: SNF Consultants:  None  Procedures:  None  Antimicrobials:  None  Status is: Inpatient    Subjective: Patient seen and examined.  Slightly confused.  Daughter at the bedside.  Patient denies any complaints.  Tells me that she slept well last night, had breakfast this morning.  No nausea, vomiting, fever or chills.  Overnight: Patient's blood sugar dropped therefore insulin was discontinued.  She was also confused last night therefore RN called patient's daughter who helped her calm down.  Objective: Vitals:   09/13/21 1924 09/14/21 0038 09/14/21 0410 09/14/21 0933  BP: 139/67 (!) 116/56 131/69 (!) 175/70  Pulse: 74 (!) 57 (!) 59 84  Resp: 15 20 18 16   Temp: 97.8 F (36.6 C) (!) 97.5 F (36.4 C) (!) 97.5 F (36.4 C) 98.4 F (36.9 C)  TempSrc: Oral   Oral  SpO2: 96% 96% 97% 97%  Weight:      Height:        Intake/Output Summary  (Last 24 hours) at 09/14/2021 1449 Last data filed at 09/14/2021 1244 Gross per 24 hour  Intake 3725.93 ml  Output 500 ml  Net 3225.93 ml    Filed Weights   09/12/21 1614  Weight: 53 kg    Examination:  General exam: Appears calm and comfortable, elderly female, on room air, slightly confused, daughter at the bedside, appears dehydrated Respiratory system: Clear to auscultation. Respiratory effort normal. Cardiovascular system: S1 & S2 heard, RRR. No JVD, murmurs, rubs, gallops or clicks. No pedal edema. Gastrointestinal system: Abdomen is nondistended, soft and nontender. No organomegaly or masses felt. Normal bowel sounds heard. Central nervous system: Alert and oriented. No focal neurological deficits. Extremities: Symmetric 5 x 5 power. Skin: No rashes, lesions or ulcers Psychiatry: Judgement and insight appear normal. Mood & affect appropriate.    Data Reviewed: I have personally reviewed following labs and imaging studies  CBC: Recent Labs  Lab 09/12/21 1632  WBC 7.1  NEUTROABS 6.0  HGB 12.9  HCT 38.2  MCV 91.0  PLT 998    Basic Metabolic Panel: Recent Labs  Lab 09/12/21 1632 09/13/21 0304 09/13/21 1611 09/13/21 2050 09/14/21 0131  NA 125* 128*  --  129* 131*  K 4.1 3.4*  --  3.4* 3.1*  CL 88* 96*  --  100 100  CO2 15* 14*  --  22 23  GLUCOSE 469* 302*  --  141* 154*  BUN 22 21  --  17 15  CREATININE 1.26* 1.15*  --  0.91 0.89  CALCIUM 9.7 9.6  --  10.0 9.4  MG  --   --  1.4*  --  2.1    GFR: Estimated Creatinine Clearance: 35.2 mL/min (by C-G formula based on SCr of 0.89 mg/dL). Liver Function Tests: Recent Labs  Lab 09/12/21 1632  AST 30  ALT 16  ALKPHOS 89  BILITOT 1.9*  PROT 7.5  ALBUMIN 3.9    No results for input(s): LIPASE, AMYLASE in the last 168 hours. No results for input(s): AMMONIA in the last 168 hours. Coagulation Profile: No results for input(s): INR, PROTIME in the last 168 hours. Cardiac Enzymes: No results for input(s):  CKTOTAL, CKMB, CKMBINDEX, TROPONINI in the last 168 hours. BNP (last 3 results) No results for input(s): PROBNP in the last 8760 hours. HbA1C: Recent Labs    09/13/21 0100 09/13/21 1611  HGBA1C 6.9* 6.9*    CBG: Recent Labs  Lab 09/14/21 0412 09/14/21 0607 09/14/21 0755 09/14/21 1145 09/14/21 1418  GLUCAP 91 129* 150* 256* 384*    Lipid Profile: No results for input(s): CHOL, HDL, LDLCALC, TRIG, CHOLHDL, LDLDIRECT in the last 72 hours. Thyroid Function Tests: No results for input(s): TSH, T4TOTAL, FREET4, T3FREE, THYROIDAB in the last 72 hours. Anemia Panel: No results for input(s): VITAMINB12, FOLATE, FERRITIN, TIBC, IRON, RETICCTPCT in the last 72 hours. Sepsis Labs: No results for input(s): PROCALCITON, LATICACIDVEN in the last 168 hours.  Recent Results (from the past 240 hour(s))  Resp Panel by RT-PCR (Flu A&B, Covid) Nasopharyngeal Swab     Status: None   Collection Time: 09/12/21  7:24 PM   Specimen: Nasopharyngeal Swab; Nasopharyngeal(NP) swabs in vial transport medium  Result Value Ref Range Status   SARS Coronavirus 2 by RT PCR NEGATIVE NEGATIVE Final    Comment: (NOTE) SARS-CoV-2 target nucleic acids  are NOT DETECTED.  The SARS-CoV-2 RNA is generally detectable in upper respiratory specimens during the acute phase of infection. The lowest concentration of SARS-CoV-2 viral copies this assay can detect is 138 copies/mL. A negative result does not preclude SARS-Cov-2 infection and should not be used as the sole basis for treatment or other patient management decisions. A negative result may occur with  improper specimen collection/handling, submission of specimen other than nasopharyngeal swab, presence of viral mutation(s) within the areas targeted by this assay, and inadequate number of viral copies(<138 copies/mL). A negative result must be combined with clinical observations, patient history, and epidemiological information. The expected result is  Negative.  Fact Sheet for Patients:  EntrepreneurPulse.com.au  Fact Sheet for Healthcare Providers:  IncredibleEmployment.be  This test is no t yet approved or cleared by the [place] FDA and  has been authorized for detection and/or diagnosis of SARS-CoV-2 by FDA under an Emergency Use Authorization (EUA). This EUA will remain  in effect (meaning this test can be used) for the duration of the COVID-19 declaration under Section 564(b)(1) of the Act, 21 U.S.C.section 360bbb-3(b)(1), unless the authorization is terminated  or revoked sooner.       Influenza A by PCR NEGATIVE NEGATIVE Final   Influenza B by PCR NEGATIVE NEGATIVE Final    Comment: (NOTE) The Xpert Xpress SARS-CoV-2/FLU/RSV plus assay is intended as an aid in the diagnosis of influenza from Nasopharyngeal swab specimens and should not be used as a sole basis for treatment. Nasal washings and aspirates are unacceptable for Xpert Xpress SARS-CoV-2/FLU/RSV testing.  Fact Sheet for Patients: EntrepreneurPulse.com.au  Fact Sheet for Healthcare Providers: IncredibleEmployment.be  This test is not yet approved or cleared by the [place] FDA and has been authorized for detection and/or diagnosis of SARS-CoV-2 by FDA under an Emergency Use Authorization (EUA). This EUA will remain in effect (meaning this test can be used) for the duration of the COVID-19 declaration under Section 564(b)(1) of the Act, 21 U.S.C. section 360bbb-3(b)(1), unless the authorization is terminated or revoked.  Performed at [facility], [place] [address], [place], [place] [address]        Radiology Studies: CT Abdomen Pelvis W Contrast  Result Date: 09/12/2021 CLINICAL DATA:  Abdominal pain, acute, nonlocalized.  Diabetes. EXAM: CT ABDOMEN AND PELVIS WITH CONTRAST TECHNIQUE: Multidetector CT imaging of the abdomen and pelvis was performed using the standard  protocol following bolus administration of intravenous contrast. RADIATION DOSE REDUCTION: This exam was performed according to the departmental dose-optimization program which includes automated exposure control, adjustment of the mA and/or kV according to patient size and/or use of iterative reconstruction technique. CONTRAST:  50mL OMNIPAQUE IOHEXOL 300 MG/ML  SOLN COMPARISON:  08/13/2021 FINDINGS: Lower chest: Lung bases are clear.  The heart is not enlarged. Hepatobiliary: No liver parenchymal abnormality is seen. No calcified gallstones. Pancreas: Atrophy of the pancreas.  No mass or inflammatory change. Spleen: Normal Adrenals/Urinary Tract: Adrenal glands are normal. Left kidney is normal. Persistent and possibly worsened appearance of the midportion of the right kidney. This could represent a renal abscess or mass. The area of concern measures approximately 3 cm in size. The bladder is distended but otherwise unremarkable. Stomach/Bowel: Stomach and small intestine are unremarkable. No sign of bowel obstruction or inflammation. Vascular/Lymphatic: Aortic atherosclerosis. No aneurysm. IVC is normal. No adenopathy. Reproductive: No pelvic mass. Other: No free fluid or air. Musculoskeletal: Worsening of an acute compression fracture of L1. Loss of height of about 30%. Mild posterior  bowing of the posterosuperior margin. No change in a acute to subacute compression fracture at L2. Degenerative changes of the spine below that. IMPRESSION: Worsening of an acute L1 compression fracture with loss of height of 30%. No change in an acute to subacute compression fracture at L2. Persistent and slightly worsened process in the right kidney which could represent inflammatory disease or mass lesion. Assuming the patient is not a good candidate or abdominal MRI, consider ultrasound for additional evaluation. No other solid organ or bowel pathology seen otherwise. Aortic Atherosclerosis (ICD10-I70.0). Electronically Signed    By: Nelson Chimes M.D.   On: 09/12/2021 20:09    Scheduled Meds:  [START ON 09/15/2021] enoxaparin (LOVENOX) injection  40 mg Subcutaneous Q24H   insulin aspart  0-15 Units Subcutaneous TID WC   insulin aspart  0-5 Units Subcutaneous QHS   insulin glargine-yfgn  7 Units Subcutaneous QHS   levothyroxine  112 mcg Oral Q0600   melatonin  3 mg Oral QHS   potassium chloride  40 mEq Oral Daily   simvastatin  20 mg Oral QHS   Continuous Infusions:  lactated ringers Stopped (09/13/21 2202)     LOS: 1 day   Time spent: 35 minutes   Necola Bluestein Loann Quill, MD [facility]  If 7PM-7AM, please contact night-coverage [email or web address] 09/14/2021, 2:49 PM

## 2021-09-15 DIAGNOSIS — S32020D Wedge compression fracture of second lumbar vertebra, subsequent encounter for fracture with routine healing: Secondary | ICD-10-CM | POA: Diagnosis not present

## 2021-09-15 DIAGNOSIS — E1010 Type 1 diabetes mellitus with ketoacidosis without coma: Secondary | ICD-10-CM | POA: Diagnosis not present

## 2021-09-15 DIAGNOSIS — R410 Disorientation, unspecified: Secondary | ICD-10-CM

## 2021-09-15 DIAGNOSIS — N179 Acute kidney failure, unspecified: Secondary | ICD-10-CM

## 2021-09-15 DIAGNOSIS — E1165 Type 2 diabetes mellitus with hyperglycemia: Secondary | ICD-10-CM | POA: Diagnosis not present

## 2021-09-15 DIAGNOSIS — I10 Essential (primary) hypertension: Secondary | ICD-10-CM

## 2021-09-15 LAB — GLUCOSE, CAPILLARY
Glucose-Capillary: 238 mg/dL — ABNORMAL HIGH (ref 70–99)
Glucose-Capillary: 320 mg/dL — ABNORMAL HIGH (ref 70–99)
Glucose-Capillary: 184 mg/dL — ABNORMAL HIGH (ref 70–99)
Glucose-Capillary: 137 mg/dL — ABNORMAL HIGH (ref 70–99)
Glucose-Capillary: 143 mg/dL — ABNORMAL HIGH (ref 70–99)

## 2021-09-15 LAB — CBC
HCT: 36.3 % (ref 36.0–46.0)
Hemoglobin: 12.6 g/dL (ref 12.0–15.0)
MCH: 30.4 pg (ref 26.0–34.0)
MCHC: 34.7 g/dL (ref 30.0–36.0)
MCV: 87.7 fL (ref 80.0–100.0)
Platelets: 226 10*3/uL (ref 150–400)
RBC: 4.14 MIL/uL (ref 3.87–5.11)
RDW: 13.5 % (ref 11.5–15.5)
WBC: 6.9 10*3/uL (ref 4.0–10.5)
nRBC: 0 % (ref 0.0–0.2)

## 2021-09-15 LAB — BASIC METABOLIC PANEL
Anion gap: 12 (ref 5–15)
BUN: 10 mg/dL (ref 8–23)
CO2: 23 mmol/L (ref 22–32)
Calcium: 9.5 mg/dL (ref 8.9–10.3)
Chloride: 92 mmol/L — ABNORMAL LOW (ref 98–111)
Creatinine, Ser: 0.79 mg/dL (ref 0.44–1.00)
GFR, Estimated: >60 mL/min (ref >60)
Glucose, Bld: 238 mg/dL — ABNORMAL HIGH (ref 70–99)
Potassium: 3.5 mmol/L (ref 3.5–5.1)
Sodium: 127 mmol/L — ABNORMAL LOW (ref 135–145)

## 2021-09-15 MED ORDER — HYDROCHLOROTHIAZIDE 25 MG PO TABS
25.0000 mg | ORAL_TABLET | Freq: Every day | ORAL | Status: DC
  Administered 2021-09-15 – 2021-09-17 (×3): 25 mg via ORAL
  Filled 2021-09-15 (×3): qty 1

## 2021-09-15 MED ORDER — LOSARTAN POTASSIUM 50 MG PO TABS
50.0000 mg | ORAL_TABLET | Freq: Every day | ORAL | Status: DC
  Administered 2021-09-15 – 2021-09-18 (×4): 50 mg via ORAL
  Filled 2021-09-15 (×4): qty 1

## 2021-09-15 MED ORDER — POTASSIUM CHLORIDE CRYS ER 20 MEQ PO TBCR
40.0000 meq | EXTENDED_RELEASE_TABLET | Freq: Two times a day (BID) | ORAL | Status: AC
  Administered 2021-09-15: 40 meq via ORAL
  Filled 2021-09-15 (×2): qty 2

## 2021-09-15 NOTE — Plan of Care (Signed)

## 2021-09-15 NOTE — Progress Notes (Signed)
Pt has not rested at all tonight.  She wondered why her Xanax was stopped since "I've been taking point 25 for years".  Mental status is clearer, but still has some forgetfulness.  She has had 2 watery BM's overnight.  Cdiff noted to be negative. Ayesha Mohair BSN RN [place] 09/15/2021, 6:06 AM

## 2021-09-15 NOTE — Progress Notes (Signed)
Discussed pt's HS glucose with Dr. Hal Hope.  Glucose was 101 at 2129.  Pt drank an Ensure (224ml).  Recheck glucose at 2259 was 206.  Verbal order received to give only 5units of the Semglee.   Ayesha Mohair BSN RN [place] 09/15/2021, 12:17 AM

## 2021-09-15 NOTE — Progress Notes (Signed)
[facility] PROGRESS NOTE    Progress Note  [name]  ALP:[id number] DOB: March 08, 1929 DOA: 09/12/2021 PCP: Binnie Rail, MD     Brief Narrative:   [name] is an 88 y.o. female past medical history significant for diabetes mellitus, essential hypertension atrial fibrillation comes in complaining of confusion and hyperglycemia for the last couple of days.  In the ED labs showed pseudohyponatremia acute kidney injury blood glucose greater than 500    Assessment/Plan:   Hyperglycemia due to diabetes mellitus ([place]) - nonadherence to home therapy d/t glucometer malfunction, assoc w/ ketosis Off IV insulin. Currently on sliding scale plus long-acting blood glucose relatively well controlled.  Loose stool: Patient did receive Xanax overnight this was discontinued. Now on melatonin.  Loose stool: C. difficile if negative.  Pseudohyponatremia: Noted.  Hypokalemia: Repleted, reviewed his basic metabolic panel, his potassium is 3.5 continue to replete orally.  Hypomagnesemia: Repleted.  Acute kidney injury: Likely due to hypovolemia resolved with IV fluid resuscitation.  Essential hypertension: Continue metoprolol, losartan diuretics were held on admission due to acute kidney injury. Blood pressure is trending up we will resume diuretic and ACE inhibitor.  Hypothyroidism: Continue Synthroid.  Compression fracture of L2: Physical therapy evaluated the patient, TOC has been consulted for skilled nursing facility placement awaiting placement. Continue narcotics for pain control.  Paroxysmal atrial fibrillation: Currently in sinus rhythm not on anticoagulation at home continue metoprolol.  Right renal mass: Has a follow-up appointment with urology as an outpatient.     DVT prophylaxis: lovenox Family Communication:non Status is: Inpatient Remains inpatient appropriate because: Awaiting skilled nursing facility placement      Code Status:      Code Status Orders  (From admission, onward)           Start     Ordered   09/13/21 0036  Full code  Continuous        09/13/21 0035           Code Status History     Date Active Date Inactive Code Status Order ID Comments User Context   09/12/2021 2254 09/13/2021 0035 Full Code [id number]  Emeterio Reeve, DO ED   09/06/2015 2045 09/07/2015 1818 Full Code [id number]  Domenic Polite, MD Inpatient         IV Access:   Peripheral IV   Procedures and diagnostic studies:   No results found.   Medical Consultants:   None.   Subjective:    [name] no complaints  Objective:    Vitals:   09/14/21 1712 09/14/21 1948 09/15/21 0442 09/15/21 0916  BP: (!) 176/78 (!) 178/77 (!) 197/83 (!) 156/72  Pulse: 81 78 77 79  Resp: 18 18 18 17   Temp: 97.7 F (36.5 C) 98.2 F (36.8 C) 98.1 F (36.7 C) 98.5 F (36.9 C)  TempSrc: Oral  Oral   SpO2: 99% 95% 97% 98%  Weight:      Height:       SpO2: 98 %   Intake/Output Summary (Last 24 hours) at 09/15/2021 1141 Last data filed at 09/15/2021 2979 Gross per 24 hour  Intake 1471.5 ml  Output 1200 ml  Net 271.5 ml   Filed Weights   09/12/21 1614  Weight: 53 kg    Exam: General exam: In no acute distress. Respiratory system: Good air movement and clear to auscultation. Cardiovascular system: S1 & S2 heard, RRR. No JVD. Gastrointestinal system: Abdomen is nondistended, soft and nontender.  Extremities: No pedal  edema. Skin: No rashes, lesions or ulcers Psychiatry: Judgement and insight appear normal. Mood & affect appropriate.    Data Reviewed:    Labs: Basic Metabolic Panel: Recent Labs  Lab 09/12/21 1632 09/13/21 0304 09/13/21 1611 09/13/21 2050 09/14/21 0131 09/15/21 0452  NA 125* 128*  --  129* 131* 127*  K 4.1 3.4*  --  3.4* 3.1* 3.5  CL 88* 96*  --  100 100 92*  CO2 15* 14*  --  22 23 23   GLUCOSE 469* 302*  --  141* 154* 238*  BUN 22 21  --  17 15 10   CREATININE 1.26* 1.15*  --  0.91  0.89 0.79  CALCIUM 9.7 9.6  --  10.0 9.4 9.5  MG  --   --  1.4*  --  2.1  --    GFR Estimated Creatinine Clearance: 39.1 mL/min (by C-G formula based on SCr of 0.79 mg/dL). Liver Function Tests: Recent Labs  Lab 09/12/21 1632  AST 30  ALT 16  ALKPHOS 89  BILITOT 1.9*  PROT 7.5  ALBUMIN 3.9   No results for input(s): LIPASE, AMYLASE in the last 168 hours. No results for input(s): AMMONIA in the last 168 hours. Coagulation profile No results for input(s): INR, PROTIME in the last 168 hours. COVID-19 Labs  No results for input(s): DDIMER, FERRITIN, LDH, CRP in the last 72 hours.  Lab Results  Component Value Date   SARSCOV2NAA NEGATIVE 09/12/2021   [place] Not Detected 06/01/2019   [place] Not Detected 02/24/2019    CBC: Recent Labs  Lab 09/12/21 1632 09/15/21 0452  WBC 7.1 6.9  NEUTROABS 6.0  --   HGB 12.9 12.6  HCT 38.2 36.3  MCV 91.0 87.7  PLT 270 226   Cardiac Enzymes: No results for input(s): CKTOTAL, CKMB, CKMBINDEX, TROPONINI in the last 168 hours. BNP (last 3 results) No results for input(s): PROBNP in the last 8760 hours. CBG: Recent Labs  Lab 09/14/21 1721 09/14/21 2129 09/14/21 2259 09/15/21 0210 09/15/21 0750  GLUCAP 208* 103* 206* 238* 320*   D-Dimer: No results for input(s): DDIMER in the last 72 hours. Hgb A1c: Recent Labs    09/13/21 0100 09/13/21 1611  HGBA1C 6.9* 6.9*   Lipid Profile: No results for input(s): CHOL, HDL, LDLCALC, TRIG, CHOLHDL, LDLDIRECT in the last 72 hours. Thyroid function studies: No results for input(s): TSH, T4TOTAL, T3FREE, THYROIDAB in the last 72 hours.  Invalid input(s): FREET3 Anemia work up: No results for input(s): VITAMINB12, FOLATE, FERRITIN, TIBC, IRON, RETICCTPCT in the last 72 hours. Sepsis Labs: Recent Labs  Lab 09/12/21 1632 09/15/21 0452  WBC 7.1 6.9   Microbiology Recent Results (from the past 240 hour(s))  Resp Panel by RT-PCR (Flu A&B, Covid) Nasopharyngeal Swab      Status: None   Collection Time: 09/12/21  7:24 PM   Specimen: Nasopharyngeal Swab; Nasopharyngeal(NP) swabs in vial transport medium  Result Value Ref Range Status   SARS Coronavirus 2 by RT PCR NEGATIVE NEGATIVE Final    Comment: (NOTE) SARS-CoV-2 target nucleic acids are NOT DETECTED.  The SARS-CoV-2 RNA is generally detectable in upper respiratory specimens during the acute phase of infection. The lowest concentration of SARS-CoV-2 viral copies this assay can detect is 138 copies/mL. A negative result does not preclude SARS-Cov-2 infection and should not be used as the sole basis for treatment or other patient management decisions. A negative result may occur with  improper specimen collection/handling, submission of specimen other than nasopharyngeal swab, presence  of viral mutation(s) within the areas targeted by this assay, and inadequate number of viral copies(<138 copies/mL). A negative result must be combined with clinical observations, patient history, and epidemiological information. The expected result is Negative.  Fact Sheet for Patients:  EntrepreneurPulse.com.au  Fact Sheet for Healthcare Providers:  IncredibleEmployment.be  This test is no t yet approved or cleared by the [place] FDA and  has been authorized for detection and/or diagnosis of SARS-CoV-2 by FDA under an Emergency Use Authorization (EUA). This EUA will remain  in effect (meaning this test can be used) for the duration of the COVID-19 declaration under Section 564(b)(1) of the Act, 21 U.S.C.section 360bbb-3(b)(1), unless the authorization is terminated  or revoked sooner.       Influenza A by PCR NEGATIVE NEGATIVE Final   Influenza B by PCR NEGATIVE NEGATIVE Final    Comment: (NOTE) The Xpert Xpress SARS-CoV-2/FLU/RSV plus assay is intended as an aid in the diagnosis of influenza from Nasopharyngeal swab specimens and should not be used as a sole basis  for treatment. Nasal washings and aspirates are unacceptable for Xpert Xpress SARS-CoV-2/FLU/RSV testing.  Fact Sheet for Patients: EntrepreneurPulse.com.au  Fact Sheet for Healthcare Providers: IncredibleEmployment.be  This test is not yet approved or cleared by the [place] FDA and has been authorized for detection and/or diagnosis of SARS-CoV-2 by FDA under an Emergency Use Authorization (EUA). This EUA will remain in effect (meaning this test can be used) for the duration of the COVID-19 declaration under Section 564(b)(1) of the Act, 21 U.S.C. section 360bbb-3(b)(1), unless the authorization is terminated or revoked.  Performed at [facility], [place] [address], [place], [place] [address]   C Difficile Quick Screen w PCR reflex     Status: None   Collection Time: 09/14/21  5:22 PM   Specimen: STOOL  Result Value Ref Range Status   C Diff antigen NEGATIVE NEGATIVE Final   C Diff toxin NEGATIVE NEGATIVE Final   C Diff interpretation No C. difficile detected.  Final    Comment: Performed at [facility], [place] [address], [place], [place] [address]     Medications:    enoxaparin (LOVENOX) injection  40 mg Subcutaneous Q24H   insulin aspart  0-15 Units Subcutaneous TID WC   insulin aspart  0-5 Units Subcutaneous QHS   insulin glargine-yfgn  7 Units Subcutaneous QHS   levothyroxine  112 mcg Oral Q0600   melatonin  3 mg Oral QHS   potassium chloride  40 mEq Oral BID   simvastatin  20 mg Oral QHS   Continuous Infusions:  lactated ringers 75 mL/hr at 09/15/21 0430      LOS: 2 days   Charlynne Cousins  [facility]  09/15/2021, 11:41 AM

## 2021-09-15 NOTE — Progress Notes (Signed)
Inpatient Diabetes Program Recommendations  AACE/ADA: New Consensus Statement on Inpatient Glycemic Control (2015)  Target Ranges:  Prepandial:   less than 140 mg/dL      Peak postprandial:   less than 180 mg/dL (1-2 hours)      Critically ill patients:  140 - 180 mg/dL   Lab Results  Component Value Date   GLUCAP 320 (H) 09/15/2021   HGBA1C 6.9 (H) 09/13/2021    Review of Glycemic Control  Latest Reference Range & Units 09/14/21 17:21 09/14/21 21:29 09/14/21 22:59 09/15/21 02:10 09/15/21 07:50  Glucose-Capillary 70 - 99 mg/dL 208 (H) 103 (H) 206 (H) 238 (H) 320 (H)  Diabetes history: DM2 Outpatient Diabetes medications: Lantus 14 units QD, Humalog 0-12 units TID Current orders for Inpatient glycemic control: Semglee 7 units QD, Novolog 0-15 units TID   Inpatient Diabetes Program Recommendations:    Consider reducing Novolog correction too sensitive and add Novolog 2 units tid with meals (hold if patient eats less than 50% or NPO).   Thanks,  Adah Perl, RN, BC-ADM Inpatient Diabetes Coordinator Pager [phone]  (8a-5p)

## 2021-09-15 NOTE — TOC Progression Note (Signed)
Transition of Care [facility]) - Initial/Assessment Note    Patient Details  Name: [name] MRN: [id number] Date of Birth: 19-Nov-1928  Transition of Care [facility]) CM/SW Contact:    [name], LCSWA Phone Number: 09/15/2021, 9:54 AM  Clinical Narrative:                 CSW contacted Olivia Mackie with [facility].  The facility is reviewing the referral and will inform CSW of the determination.    Expected Discharge Plan: Skilled Nursing Facility Barriers to Discharge: Insurance Authorization, SNF Pending bed offer   Patient Goals and CMS Choice Patient states their goals for this hospitalization and ongoing recovery are:: To get home CMS Medicare.gov Compare Post Acute Care list provided to:: Patient Represenative (must comment) Choice offered to / list presented to : Spouse  Expected Discharge Plan and Services Expected Discharge Plan: [place]       Living arrangements for the past 2 months: Single Family Home                                      Prior Living Arrangements/Services Living arrangements for the past 2 months: Single Family Home Lives with:: Self, Spouse Patient language and need for interpreter reviewed:: Yes Do you feel safe going back to the place where you live?: Yes      Need for Family Participation in Patient Care: Yes (Comment) Care giver support system in place?: Yes (comment)   Criminal Activity/Legal Involvement Pertinent to Current Situation/Hospitalization: No - Comment as needed  Activities of Daily Living      Permission Sought/Granted   Permission granted to share information with : Yes, Release of Information Signed     Permission granted to share info w AGENCY: SNFs  Permission granted to share info w Relationship: Seibold,Paul C (Spouse)     Emotional Assessment Appearance:: Appears stated age Attitude/Demeanor/Rapport: Gracious Affect (typically observed): Calm Orientation: : Oriented to Self, Oriented to  Place Alcohol / Substance Use: Not Applicable Psych Involvement: No (comment)  Admission diagnosis:  Diabetic ketoacidosis without coma associated with type 1 diabetes mellitus ([place]) [E10.10] Hyperglycemia due to diabetes mellitus ([place]) [E11.65] Patient Active Problem List   Diagnosis Date Noted   Hyperglycemia due to diabetes mellitus ([place]) - nonadherence to home therapy d/t glucometer malfunction, assoc w/ ketosis 09/12/2021   AKI (acute kidney injury) ([place]) 09/12/2021   History of small bowel obstruction 08/19/2021   Menopause present 84/16/6063   Umbilical hernia 01/60/1093   Atrophy of vagina 08/19/2021   Compression fracture of L1 lumbar vertebra ([place]) 08/18/2021   Compression fracture of L2 ([place]) 08/18/2021   Black stools 08/13/2021   Right renal mass 06/30/2021   Lower back pain 06/30/2021   Acute cystitis 05/05/2021   Anxiety 12/31/2020   Pain in left foot 03/11/2020   Left leg swelling 03/05/2020   B12 deficiency 12/15/2019   Tingling in extremities 10/04/2019   Compression fracture of thoracic vertebra ([place]) 08/21/2019   Osteoporosis 08/21/2019   Chronic thoracic back pain 08/16/2019   History of Atrial fibrillation ([place]) - currently sinus rhythm 02/01/2018   Leg cramps 07/05/2017   Constipation 01/25/2017   Closed fracture of proximal end of right humerus 07/09/2016   Cough 06/17/2016   Abdominal pain 03/10/2016   Hearing loss 01/03/2016   Allergic rhinitis 01/03/2016   Minimal Coronary Plaque by Cardiac Cath in 2013 09/11/2015  Frequent loose stools 08/28/2015   Altered bowel habits 09/26/2014   Hyponatremia    Acute mesenteric ischemia ([place])    Hypothyroidism 07/11/2012   Syncope 03/18/2012   Hypercholesteremia    Hypertension    Diabetes mellitus type 1 ([place])    Osteoarthritis    Thyroid nodule    Osteoporosis, post-menopausal    Malignant neoplasm of breast ([place]) 11/10/2011   Primary cancer of upper outer quadrant of left female breast ([place])  08/21/2011   PCP:  Binnie Rail, MD Pharmacy:   [facility], [place] [address] Phone: [phone] Fax: [phone]  [id number] - [place], [address] AT [place] [address] [place] [address] Phone: [phone] Fax: [phone]     Social Determinants of Health (SDOH) Interventions    Readmission Risk Interventions No flowsheet data found.

## 2021-09-16 DIAGNOSIS — N179 Acute kidney failure, unspecified: Secondary | ICD-10-CM | POA: Diagnosis not present

## 2021-09-16 DIAGNOSIS — E1010 Type 1 diabetes mellitus with ketoacidosis without coma: Secondary | ICD-10-CM | POA: Diagnosis not present

## 2021-09-16 DIAGNOSIS — S32020D Wedge compression fracture of second lumbar vertebra, subsequent encounter for fracture with routine healing: Secondary | ICD-10-CM | POA: Diagnosis not present

## 2021-09-16 DIAGNOSIS — E1165 Type 2 diabetes mellitus with hyperglycemia: Secondary | ICD-10-CM | POA: Diagnosis not present

## 2021-09-16 LAB — BASIC METABOLIC PANEL
Anion gap: 11 (ref 5–15)
BUN: 9 mg/dL (ref 8–23)
CO2: 22 mmol/L (ref 22–32)
Calcium: 9.8 mg/dL (ref 8.9–10.3)
Chloride: 96 mmol/L — ABNORMAL LOW (ref 98–111)
Creatinine, Ser: 0.79 mg/dL (ref 0.44–1.00)
GFR, Estimated: >60 mL/min (ref >60)
Glucose, Bld: 195 mg/dL — ABNORMAL HIGH (ref 70–99)
Potassium: 4.3 mmol/L (ref 3.5–5.1)
Sodium: 129 mmol/L — ABNORMAL LOW (ref 135–145)

## 2021-09-16 LAB — GLUCOSE, CAPILLARY
Glucose-Capillary: 239 mg/dL — ABNORMAL HIGH (ref 70–99)
Glucose-Capillary: 215 mg/dL — ABNORMAL HIGH (ref 70–99)
Glucose-Capillary: 138 mg/dL — ABNORMAL HIGH (ref 70–99)
Glucose-Capillary: 164 mg/dL — ABNORMAL HIGH (ref 70–99)

## 2021-09-16 MED ORDER — SODIUM CHLORIDE 0.9 % IV SOLN: INTRAVENOUS | Status: AC

## 2021-09-16 MED ORDER — INSULIN ASPART 100 UNIT/ML IJ SOLN
3.0000 [IU] | Freq: Three times a day (TID) | INTRAMUSCULAR | Status: DC
  Administered 2021-09-16 – 2021-09-19 (×8): 3 [IU] via SUBCUTANEOUS

## 2021-09-16 MED ORDER — ASPIRIN EC 81 MG PO TBEC
81.0000 mg | DELAYED_RELEASE_TABLET | Freq: Every day | ORAL | Status: DC
  Administered 2021-09-16 – 2021-09-17 (×2): 81 mg via ORAL
  Filled 2021-09-16 (×2): qty 1

## 2021-09-16 MED ORDER — INSULIN ASPART 100 UNIT/ML IJ SOLN: 0.0000 [IU] | Freq: Every day | INTRAMUSCULAR | Status: DC

## 2021-09-16 MED ORDER — HYDRALAZINE HCL 10 MG PO TABS
10.0000 mg | ORAL_TABLET | Freq: Four times a day (QID) | ORAL | Status: DC | PRN
  Administered 2021-09-18: 10 mg via ORAL
  Filled 2021-09-16 (×2): qty 1

## 2021-09-16 MED ORDER — INSULIN ASPART 100 UNIT/ML IJ SOLN
0.0000 [IU] | Freq: Three times a day (TID) | INTRAMUSCULAR | Status: DC
  Administered 2021-09-16: 1 [IU] via SUBCUTANEOUS
  Administered 2021-09-16: 3 [IU] via SUBCUTANEOUS
  Administered 2021-09-17: 7 [IU] via SUBCUTANEOUS
  Administered 2021-09-17: 5 [IU] via SUBCUTANEOUS
  Administered 2021-09-17: 1 [IU] via SUBCUTANEOUS
  Administered 2021-09-18: 5 [IU] via SUBCUTANEOUS
  Administered 2021-09-18: 3 [IU] via SUBCUTANEOUS
  Administered 2021-09-19: 2 [IU] via SUBCUTANEOUS
  Administered 2021-09-19: 7 [IU] via SUBCUTANEOUS

## 2021-09-16 NOTE — Progress Notes (Signed)
Physical Therapy Treatment Patient Details Name: [name] MRN: [id number] DOB: 1929-05-15 Today's Date: 09/16/2021   History of Present Illness 88 y/o female presented to ED on 09/12/21 for confusion and hyperglycemia. CT abdomen showed worsening acute compression fx of L1, worsening of R kidney mass. PMH: HTN, DM, hypothyroidism, Afib    PT Comments    Patient received in recliner, daughter present in room. She is agreeable to PT session. Patient requires cues and min assist for safe hand placement during transfers. Min guard to power up to standing. Patient ambulated 20 feet in room with RW and min guard. LE strengthening exercises with min guard/cues in recliner. She will continue to benefit from skilled PT while here to improve strength, safety and functional independence.  Progressing well toward goals.      Recommendations for follow up therapy are one component of a multi-disciplinary discharge planning process, led by the attending physician.  Recommendations may be updated based on patient status, additional functional criteria and insurance authorization.  Follow Up Recommendations  Skilled nursing-short term rehab (<3 hours/day)     Assistance Recommended at Discharge Intermittent Supervision/Assistance  Patient can return home with the following A little help with walking and/or transfers;A little help with bathing/dressing/bathroom;Assist for transportation;Help with stairs or ramp for entrance   Equipment Recommendations  Other (comment) (TBD)    Recommendations for Other Services       Precautions / Restrictions Precautions Precautions: Fall Restrictions Weight Bearing Restrictions: No     Mobility  Bed Mobility               General bed mobility comments: patient received up in recliner    Transfers Overall transfer level: Needs assistance Equipment used: Rolling walker (2 wheels) Transfers: Sit to/from Stand Sit to Stand: Min guard            General transfer comment: min guard with cues and hand over hand for proper hand placement for transfers.    Ambulation/Gait Ambulation/Gait assistance: Min guard Gait Distance (Feet): 20 Feet Assistive device: Rolling walker (2 wheels) Gait Pattern/deviations: Step-through pattern       General Gait Details: min guard and RW for ambulation in room.   Stairs             Wheelchair Mobility    Modified Rankin (Stroke Patients Only)       Balance Overall balance assessment: Needs assistance Sitting-balance support: Feet supported Sitting balance-Leahy Scale: Good     Standing balance support: Bilateral upper extremity supported, During functional activity, Reliant on assistive device for balance Standing balance-Leahy Scale: Fair Standing balance comment: reliant on UE support of RW                            Cognition Arousal/Alertness: Awake/alert Behavior During Therapy: WFL for tasks assessed/performed Overall Cognitive Status: Impaired/Different from baseline Area of Impairment: Following commands, Safety/judgement, Awareness, Problem solving, Memory                 Orientation Level: Disoriented to, Time, Situation Current Attention Level: Sustained Memory: Decreased short-term memory Following Commands: Follows one step commands inconsistently, Follows one step commands with increased time Safety/Judgement: Decreased awareness of safety Awareness: Intellectual Problem Solving: Slow processing, Requires verbal cues, Requires tactile cues          Exercises Other Exercises Other Exercises: B LE exercises: AP, heel slides, hip abd/add, SLR x 10 reps each    General  Comments        Pertinent Vitals/Pain Pain Assessment Pain Assessment: No/denies pain Pain Intervention(s): Monitored during session    Home Living                          Prior Function            PT Goals (current goals can now be found in the  care plan section) Acute Rehab PT Goals Patient Stated Goal: to go to rehab PT Goal Formulation: With family Time For Goal Achievement: 09/28/21 Potential to Achieve Goals: Good Progress towards PT goals: Progressing toward goals    Frequency    Min 3X/week      PT Plan Current plan remains appropriate    Co-evaluation              AM-PAC PT "6 Clicks" Mobility   Outcome Measure  Help needed turning from your back to your side while in a flat bed without using bedrails?: A Little Help needed moving from lying on your back to sitting on the side of a flat bed without using bedrails?: A Little Help needed moving to and from a bed to a chair (including a wheelchair)?: A Little Help needed standing up from a chair using your arms (e.g., wheelchair or bedside chair)?: A Little Help needed to walk in hospital room?: A Little Help needed climbing 3-5 steps with a railing? : A Lot 6 Click Score: 17    End of Session Equipment Utilized During Treatment: Gait belt Activity Tolerance: Patient tolerated treatment well Patient left: in chair;with call bell/phone within reach;with chair alarm set;with family/visitor present Nurse Communication: Mobility status PT Visit Diagnosis: Difficulty in walking, not elsewhere classified (R26.2);Unsteadiness on feet (R26.81);Muscle weakness (generalized) (M62.81)     Time: 1610-9604 PT Time Calculation (min) (ACUTE ONLY): 23 min  Charges:  $Gait Training: 8-22 mins $Therapeutic Exercise: 8-22 mins                     Melea Prezioso, PT, GCS 09/16/21,11:25 AM

## 2021-09-16 NOTE — Progress Notes (Signed)
Notified/asked  on call MD Hal Hope about pt.'s potassium P.O. medication order. Per MD give Potassium 40 meq P.O , duplicate the other order, order lab to check BMP

## 2021-09-16 NOTE — Care Management Important Message (Signed)
Important Message  Patient Details  Name: [name] MRN: [id number] Date of Birth: [birth year]   Medicare Important Message Given:  Yes     Orbie Pyo 09/16/2021, 12:21 PM

## 2021-09-16 NOTE — Progress Notes (Signed)
[facility] PROGRESS NOTE    Progress Note  [name]  ZOX:[id number] DOB: [birth year] DOA: 09/12/2021 PCP: Binnie Rail, MD     Brief Narrative:   [name] is an 88 y.o. female past medical history significant for diabetes mellitus, essential hypertension atrial fibrillation comes in complaining of confusion and hyperglycemia for the last couple of days.  In the ED labs showed pseudohyponatremia acute kidney injury blood glucose greater than 500  Assessment/Plan:   Hyperglycemia due to diabetes mellitus ([place]) - nonadherence to home therapy d/t glucometer malfunction, assoc w/ ketosis Off IV insulin. Change to sliding scale insulin with meal coverage. Physical therapy evaluated patient, she will need to go to skilled nursing facility. Awaiting TOC family wants to go to [place]  Delirium: Patient did receive Xanax overnight this was discontinued. Now on melatonin.  Loose stool: C. difficile if negative.  Pseudohyponatremia: Noted.  Hypokalemia: Repleted, reviewed his basic metabolic panel, his potassium is 3.5 continue to replete orally.  Hypomagnesemia: Repleted.  Acute kidney injury: Likely due to hypovolemia resolved with IV fluid resuscitation.  Essential hypertension: Continue metoprolol, losartan diuretics were held on admission due to acute kidney injury. Blood pressure is trending up we will resume diuretic and ACE inhibitor.  Hypothyroidism: Continue Synthroid.  Compression fracture of L2: Physical therapy evaluated the patient, TOC has been consulted for skilled nursing facility placement awaiting placement. Continue narcotics for pain control.  Paroxysmal atrial fibrillation: Currently in sinus rhythm not on anticoagulation at home continue metoprolol.  Right renal mass: Has a follow-up appointment with urology as an outpatient.     DVT prophylaxis: lovenox Family Communication:non Status is: Inpatient Remains inpatient  appropriate because: Awaiting skilled nursing facility placement      Code Status:     Code Status Orders  (From admission, onward)           Start     Ordered   09/13/21 0036  Full code  Continuous        09/13/21 0035           Code Status History     Date Active Date Inactive Code Status Order ID Comments User Context   09/12/2021 2254 09/13/2021 0035 Full Code [id number]  Emeterio Reeve, DO ED   09/06/2015 2045 09/07/2015 1818 Full Code [id number]  Domenic Polite, MD Inpatient         IV Access:   Peripheral IV   Procedures and diagnostic studies:   No results found.   Medical Consultants:   None.   Subjective:    [name] no complaints  Objective:    Vitals:   09/15/21 2105 09/15/21 2142 09/16/21 0531 09/16/21 0800  BP: (!) 204/86 (!) 174/73 (!) 194/87 (!) 182/85  Pulse: 80  74 81  Resp: 18  16 17   Temp: 98.2 F (36.8 C)  98.2 F (36.8 C) 97.6 F (36.4 C)  TempSrc: Oral   Oral  SpO2: 96%  100% 96%  Weight:    53 kg  Height:    5\' 7"  (1.702 m)   SpO2: 96 %   Intake/Output Summary (Last 24 hours) at 09/16/2021 1043 Last data filed at 09/16/2021 0900 Gross per 24 hour  Intake 1957.1 ml  Output 1400 ml  Net 557.1 ml    Filed Weights   09/12/21 1614 09/16/21 0800  Weight: 53 kg 53 kg    Exam: General exam: In no acute distress. Respiratory system: Good air movement and clear to auscultation.  Cardiovascular system: S1 & S2 heard, RRR. No JVD. Gastrointestinal system: Abdomen is nondistended, soft and nontender.  Extremities: No pedal edema. Skin: No rashes, lesions or ulcers Psychiatry: Judgement and insight appear normal. Mood & affect appropriate.   Data Reviewed:    Labs: Basic Metabolic Panel: Recent Labs  Lab 09/13/21 0304 09/13/21 1611 09/13/21 2050 09/14/21 0131 09/15/21 0452 09/15/21 2331  NA 128*  --  129* 131* 127* 129*  K 3.4*  --  3.4* 3.1* 3.5 4.3  CL 96*  --  100 100 92* 96*  CO2 14*  --  22  23 23 22   GLUCOSE 302*  --  141* 154* 238* 195*  BUN 21  --  17 15 10 9   CREATININE 1.15*  --  0.91 0.89 0.79 0.79  CALCIUM 9.6  --  10.0 9.4 9.5 9.8  MG  --  1.4*  --  2.1  --   --     GFR Estimated Creatinine Clearance: 39.1 mL/min (by C-G formula based on SCr of 0.79 mg/dL). Liver Function Tests: Recent Labs  Lab 09/12/21 1632  AST 30  ALT 16  ALKPHOS 89  BILITOT 1.9*  PROT 7.5  ALBUMIN 3.9    No results for input(s): LIPASE, AMYLASE in the last 168 hours. No results for input(s): AMMONIA in the last 168 hours. Coagulation profile No results for input(s): INR, PROTIME in the last 168 hours. COVID-19 Labs  No results for input(s): DDIMER, FERRITIN, LDH, CRP in the last 72 hours.  Lab Results  Component Value Date   SARSCOV2NAA NEGATIVE 09/12/2021   [place] Not Detected 06/01/2019   [place] Not Detected 02/24/2019    CBC: Recent Labs  Lab 09/12/21 1632 09/15/21 0452  WBC 7.1 6.9  NEUTROABS 6.0  --   HGB 12.9 12.6  HCT 38.2 36.3  MCV 91.0 87.7  PLT 270 226    Cardiac Enzymes: No results for input(s): CKTOTAL, CKMB, CKMBINDEX, TROPONINI in the last 168 hours. BNP (last 3 results) No results for input(s): PROBNP in the last 8760 hours. CBG: Recent Labs  Lab 09/15/21 0750 09/15/21 1140 09/15/21 1635 09/15/21 2146 09/16/21 0744  GLUCAP 320* 184* 137* 143* 239*    D-Dimer: No results for input(s): DDIMER in the last 72 hours. Hgb A1c: Recent Labs    09/13/21 1611  HGBA1C 6.9*    Lipid Profile: No results for input(s): CHOL, HDL, LDLCALC, TRIG, CHOLHDL, LDLDIRECT in the last 72 hours. Thyroid function studies: No results for input(s): TSH, T4TOTAL, T3FREE, THYROIDAB in the last 72 hours.  Invalid input(s): FREET3 Anemia work up: No results for input(s): VITAMINB12, FOLATE, FERRITIN, TIBC, IRON, RETICCTPCT in the last 72 hours. Sepsis Labs: Recent Labs  Lab 09/12/21 1632 09/15/21 0452  WBC 7.1 6.9    Microbiology Recent  Results (from the past 240 hour(s))  Resp Panel by RT-PCR (Flu A&B, Covid) Nasopharyngeal Swab     Status: None   Collection Time: 09/12/21  7:24 PM   Specimen: Nasopharyngeal Swab; Nasopharyngeal(NP) swabs in vial transport medium  Result Value Ref Range Status   SARS Coronavirus 2 by RT PCR NEGATIVE NEGATIVE Final    Comment: (NOTE) SARS-CoV-2 target nucleic acids are NOT DETECTED.  The SARS-CoV-2 RNA is generally detectable in upper respiratory specimens during the acute phase of infection. The lowest concentration of SARS-CoV-2 viral copies this assay can detect is 138 copies/mL. A negative result does not preclude SARS-Cov-2 infection and should not be used as the sole basis for treatment  or other patient management decisions. A negative result may occur with  improper specimen collection/handling, submission of specimen other than nasopharyngeal swab, presence of viral mutation(s) within the areas targeted by this assay, and inadequate number of viral copies(<138 copies/mL). A negative result must be combined with clinical observations, patient history, and epidemiological information. The expected result is Negative.  Fact Sheet for Patients:  EntrepreneurPulse.com.au  Fact Sheet for Healthcare Providers:  IncredibleEmployment.be  This test is no t yet approved or cleared by the [place] FDA and  has been authorized for detection and/or diagnosis of SARS-CoV-2 by FDA under an Emergency Use Authorization (EUA). This EUA will remain  in effect (meaning this test can be used) for the duration of the COVID-19 declaration under Section 564(b)(1) of the Act, 21 U.S.C.section 360bbb-3(b)(1), unless the authorization is terminated  or revoked sooner.       Influenza A by PCR NEGATIVE NEGATIVE Final   Influenza B by PCR NEGATIVE NEGATIVE Final    Comment: (NOTE) The Xpert Xpress SARS-CoV-2/FLU/RSV plus assay is intended as an aid in the  diagnosis of influenza from Nasopharyngeal swab specimens and should not be used as a sole basis for treatment. Nasal washings and aspirates are unacceptable for Xpert Xpress SARS-CoV-2/FLU/RSV testing.  Fact Sheet for Patients: EntrepreneurPulse.com.au  Fact Sheet for Healthcare Providers: IncredibleEmployment.be  This test is not yet approved or cleared by the [place] FDA and has been authorized for detection and/or diagnosis of SARS-CoV-2 by FDA under an Emergency Use Authorization (EUA). This EUA will remain in effect (meaning this test can be used) for the duration of the COVID-19 declaration under Section 564(b)(1) of the Act, 21 U.S.C. section 360bbb-3(b)(1), unless the authorization is terminated or revoked.  Performed at [facility], [place] [address], [place], [place] [address]   C Difficile Quick Screen w PCR reflex     Status: None   Collection Time: 09/14/21  5:22 PM   Specimen: STOOL  Result Value Ref Range Status   C Diff antigen NEGATIVE NEGATIVE Final   C Diff toxin NEGATIVE NEGATIVE Final   C Diff interpretation No C. difficile detected.  Final    Comment: Performed at [facility], [place] [address], [place], [place] [address]     Medications:    enoxaparin (LOVENOX) injection  40 mg Subcutaneous Q24H   hydrochlorothiazide  25 mg Oral Daily   insulin aspart  0-15 Units Subcutaneous TID WC   insulin aspart  0-5 Units Subcutaneous QHS   insulin glargine-yfgn  7 Units Subcutaneous QHS   levothyroxine  112 mcg Oral Q0600   losartan  50 mg Oral Daily   melatonin  3 mg Oral QHS   potassium chloride  40 mEq Oral BID   simvastatin  20 mg Oral QHS   Continuous Infusions:  lactated ringers 75 mL/hr at 09/16/21 0821      LOS: 3 days   Charlynne Cousins  [facility]  09/16/2021, 10:43 AM

## 2021-09-16 NOTE — TOC Progression Note (Signed)
Transition of Care [facility]) - Initial/Assessment Note    Patient Details  Name: [name] MRN: [id number] Date of Birth: 1928-11-23  [facility]) CM/SW Contact:    [name], [place] Phone Number: 09/16/2021, 12:09 PM  Clinical Narrative:                 CSW received information that the facility will be able to accept the patient on Thursday 2/9.  CSW requested that TOC admin begin insurance auth.    Expected Discharge Plan: Skilled Nursing Facility Barriers to Discharge: Insurance Authorization, SNF Pending bed offer   Patient Goals and CMS Choice Patient states their goals for this hospitalization and ongoing recovery are:: To get home CMS Medicare.gov Compare Post Acute Care list provided to:: Patient Represenative (must comment) Choice offered to / list presented to : Spouse  Expected Discharge Plan and Services Expected Discharge Plan: [place]       Living arrangements for the past 2 months: Single Family Home                                      Prior Living Arrangements/Services Living arrangements for the past 2 months: Single Family Home Lives with:: Self, Spouse Patient language and need for interpreter reviewed:: Yes Do you feel safe going back to the place where you live?: Yes      Need for Family Participation in Patient Care: Yes (Comment) Care giver support system in place?: Yes (comment)   Criminal Activity/Legal Involvement Pertinent to Current Situation/Hospitalization: No - Comment as needed  Activities of Daily Living      Permission Sought/Granted   Permission granted to share information with : Yes, Release of Information Signed     Permission granted to share info w AGENCY: SNFs  Permission granted to share info w Relationship: Fedorchak,Paul C (Spouse)     Emotional Assessment Appearance:: Appears stated age Attitude/Demeanor/Rapport: Gracious Affect (typically observed): Calm Orientation: :  Oriented to Self, Oriented to Place Alcohol / Substance Use: Not Applicable Psych Involvement: No (comment)  Admission diagnosis:  Diabetic ketoacidosis without coma associated with type 1 diabetes mellitus ([place]) [E10.10] Hyperglycemia due to diabetes mellitus ([place]) [E11.65] Patient Active Problem List   Diagnosis Date Noted   Hyperglycemia due to diabetes mellitus ([place]) - nonadherence to home therapy d/t glucometer malfunction, assoc w/ ketosis 09/12/2021   AKI (acute kidney injury) ([place]) 09/12/2021   History of small bowel obstruction 08/19/2021   Menopause present 16/96/7893   Umbilical hernia 81/08/7508   Atrophy of vagina 08/19/2021   Compression fracture of L1 lumbar vertebra ([place]) 08/18/2021   Compression fracture of L2 ([place]) 08/18/2021   Black stools 08/13/2021   Right renal mass 06/30/2021   Lower back pain 06/30/2021   Acute cystitis 05/05/2021   Anxiety 12/31/2020   Pain in left foot 03/11/2020   Left leg swelling 03/05/2020   B12 deficiency 12/15/2019   Tingling in extremities 10/04/2019   Compression fracture of thoracic vertebra ([place]) 08/21/2019   Osteoporosis 08/21/2019   Chronic thoracic back pain 08/16/2019   History of Atrial fibrillation ([place]) - currently sinus rhythm 02/01/2018   Leg cramps 07/05/2017   Constipation 01/25/2017   Closed fracture of proximal end of right humerus 07/09/2016   Cough 06/17/2016   Abdominal pain 03/10/2016   Hearing loss 01/03/2016   Allergic rhinitis 01/03/2016   Minimal Coronary Plaque by Cardiac  Cath in 2013 09/11/2015   Frequent loose stools 08/28/2015   Altered bowel habits 09/26/2014   Hyponatremia    Acute mesenteric ischemia ([place])    Hypothyroidism 07/11/2012   Syncope 03/18/2012   Hypercholesteremia    Hypertension    Diabetes mellitus type 1 ([place])    Osteoarthritis    Thyroid nodule    Osteoporosis, post-menopausal    Malignant neoplasm of breast ([place]) 11/10/2011   Primary cancer of upper outer quadrant of  left female breast ([place]) 08/21/2011   PCP:  Binnie Rail, MD Pharmacy:   [facility], [place] [address] Phone: [phone] Fax: [phone]  [id number] - [place], [address] AT [place] [address] [place] [address] Phone: [phone] Fax: [phone]     Social Determinants of Health (SDOH) Interventions    Readmission Risk Interventions No flowsheet data found.

## 2021-09-16 NOTE — Progress Notes (Signed)
noticed pinkish/reddish urine output from purewick. assessed patient perineal area no wound/laceration. Notified on call MD Hal Hope and made aware. No order given

## 2021-09-17 ENCOUNTER — Inpatient Hospital Stay (HOSPITAL_COMMUNITY): Payer: Medicare PPO

## 2021-09-17 DIAGNOSIS — E1165 Type 2 diabetes mellitus with hyperglycemia: Secondary | ICD-10-CM | POA: Diagnosis not present

## 2021-09-17 LAB — URINALYSIS, ROUTINE W REFLEX MICROSCOPIC
Bacteria, UA: NONE SEEN
Bilirubin Urine: NEGATIVE
Glucose, UA: 150 mg/dL — AB
Ketones, ur: NEGATIVE mg/dL
Leukocytes,Ua: NEGATIVE
Nitrite: NEGATIVE
Protein, ur: NEGATIVE mg/dL
RBC / HPF: >50 RBC/hpf — ABNORMAL HIGH (ref 0–5)
Specific Gravity, Urine: 1.008 (ref 1.005–1.030)
pH: 8 (ref 5.0–8.0)

## 2021-09-17 LAB — GLUCOSE, CAPILLARY
Glucose-Capillary: 302 mg/dL — ABNORMAL HIGH (ref 70–99)
Glucose-Capillary: 195 mg/dL — ABNORMAL HIGH (ref 70–99)
Glucose-Capillary: 141 mg/dL — ABNORMAL HIGH (ref 70–99)
Glucose-Capillary: 185 mg/dL — ABNORMAL HIGH (ref 70–99)

## 2021-09-17 MED ORDER — SIMETHICONE 80 MG PO CHEW
80.0000 mg | CHEWABLE_TABLET | Freq: Four times a day (QID) | ORAL | Status: DC | PRN
  Filled 2021-09-17: qty 1

## 2021-09-17 MED ORDER — GADOBUTROL 1 MMOL/ML IV SOLN
6.0000 mL | Freq: Once | INTRAVENOUS | Status: AC | PRN
  Administered 2021-09-17: 6 mL via INTRAVENOUS

## 2021-09-17 NOTE — Progress Notes (Addendum)
PROGRESS NOTE    [name]  RWE:[id number] DOB: May 18, 1929 DOA: 09/12/2021 PCP: Binnie Rail, MD   Brief Narrative:   [name] is an 88 y.o. female past medical history significant for diabetes mellitus, essential hypertension atrial fibrillation comes in complaining of confusion and hyperglycemia for the last couple of days.  In the ED labs showed pseudohyponatremia acute kidney injury blood glucose greater than 500.  Assessment & Plan:   Principal Problem:   Hyperglycemia due to diabetes mellitus ([place]) - nonadherence to home therapy d/t glucometer malfunction, assoc w/ ketosis Active Problems:   Hypertension   Hypothyroidism   History of Atrial fibrillation ([facility]) - currently sinus rhythm   Right renal mass   Compression fracture of L2 (H[facility])   AKI (acute kidney injury) ([place])   Hyperglycemia due to diabetes mellitus ([place]) - nonadherence to home therapy d/t glucometer malfunction, assoc w/ ketosis-resolved Off IV insulin. Tolerating SSI and meals Awaiting SNF/rehab placement with plans for discharge 2/19   Delirium-resolved Patient did receive Xanax overnight this was discontinued. Now on melatonin.  Pseudohyponatremia: Noted.  Acute kidney injury-resolved  Essential hypertension: Continue metoprolol, losartan diuretics were held on admission due to acute kidney injury. Blood pressure is trending up we will resume diuretic and ACE inhibitor.   Hypothyroidism: Continue Synthroid.  Compression fracture of L2: Physical therapy evaluated the patient, TOC has been consulted for skilled nursing facility placement awaiting placement. Continue narcotics for pain control.  Paroxysmal atrial fibrillation: Currently in sinus rhythm not on anticoagulation at home continue metoprolol.  Right renal mass: Initially thought to have hematuria, but source was really a vaginal bleed  Plan to check abdominal MRI No sign of UTI  Vaginal bleed/clots Appreciate Gyn  evaluation   DVT prophylaxis:Lovenox to SCDs Code Status: Full Family Communication: Discussed with daughter on phone 2/8 Disposition Plan:  Status is: Inpatient Remains inpatient appropriate because: Awaiting SNF placement and need for IV.   Consultants:  None  Procedures:  None  Antimicrobials:  Anti-infectives (From admission, onward)    None       Subjective: Patient seen and evaluated today with some complaints of fullness in her bladder region as well as burning with urination.  No other acute overnight events noted.  Objective: Vitals:   09/16/21 1829 09/16/21 2122 09/17/21 0456 09/17/21 0458  BP: (!) 149/74 (!) 189/91  (!) 187/79  Pulse: 83 80  68  Resp:  18  18  Temp:  98.9 F (37.2 C)  98 F (36.7 C)  TempSrc:  Oral  Oral  SpO2: 97% 97%  95%  Weight:   53.2 kg   Height:        Intake/Output Summary (Last 24 hours) at 09/17/2021 0817 Last data filed at 09/17/2021 0323 Gross per 24 hour  Intake 1604.27 ml  Output 1300 ml  Net 304.27 ml   Filed Weights   09/12/21 1614 09/16/21 0800 09/17/21 0456  Weight: 53 kg 53 kg 53.2 kg    Examination:  General exam: Appears calm and comfortable  Respiratory system: Clear to auscultation. Respiratory effort normal. Cardiovascular system: S1 & S2 heard, RRR.  Gastrointestinal system: Abdomen is soft Central nervous system: Alert and awake Extremities: No edema Skin: No significant lesions noted Psychiatry: Flat affect.    Data Reviewed: I have personally reviewed following labs and imaging studies  CBC: Recent Labs  Lab 09/12/21 1632 09/15/21 0452  WBC 7.1 6.9  NEUTROABS 6.0  --   HGB 12.9 12.6  HCT 38.2 36.3  MCV 91.0 87.7  PLT 270 578   Basic Metabolic Panel: Recent Labs  Lab 09/13/21 0304 09/13/21 1611 09/13/21 2050 09/14/21 0131 09/15/21 0452 09/15/21 2331  NA 128*  --  129* 131* 127* 129*  K 3.4*  --  3.4* 3.1* 3.5 4.3  CL 96*  --  100 100 92* 96*  CO2 14*  --  22 23 23 22    GLUCOSE 302*  --  141* 154* 238* 195*  BUN 21  --  17 15 10 9   CREATININE 1.15*  --  0.91 0.89 0.79 0.79  CALCIUM 9.6  --  10.0 9.4 9.5 9.8  MG  --  1.4*  --  2.1  --   --    GFR: Estimated Creatinine Clearance: 39.3 mL/min (by C-G formula based on SCr of 0.79 mg/dL). Liver Function Tests: Recent Labs  Lab 09/12/21 1632  AST 30  ALT 16  ALKPHOS 89  BILITOT 1.9*  PROT 7.5  ALBUMIN 3.9   No results for input(s): LIPASE, AMYLASE in the last 168 hours. No results for input(s): AMMONIA in the last 168 hours. Coagulation Profile: No results for input(s): INR, PROTIME in the last 168 hours. Cardiac Enzymes: No results for input(s): CKTOTAL, CKMB, CKMBINDEX, TROPONINI in the last 168 hours. BNP (last 3 results) No results for input(s): PROBNP in the last 8760 hours. HbA1C: No results for input(s): HGBA1C in the last 72 hours. CBG: Recent Labs  Lab 09/15/21 2146 09/16/21 0744 09/16/21 1127 09/16/21 1656 09/16/21 2122  GLUCAP 143* 239* 215* 138* 164*   Lipid Profile: No results for input(s): CHOL, HDL, LDLCALC, TRIG, CHOLHDL, LDLDIRECT in the last 72 hours. Thyroid Function Tests: No results for input(s): TSH, T4TOTAL, FREET4, T3FREE, THYROIDAB in the last 72 hours. Anemia Panel: No results for input(s): VITAMINB12, FOLATE, FERRITIN, TIBC, IRON, RETICCTPCT in the last 72 hours. Sepsis Labs: No results for input(s): PROCALCITON, LATICACIDVEN in the last 168 hours.  Recent Results (from the past 240 hour(s))  Resp Panel by RT-PCR (Flu A&B, Covid) Nasopharyngeal Swab     Status: None   Collection Time: 09/12/21  7:24 PM   Specimen: Nasopharyngeal Swab; Nasopharyngeal(NP) swabs in vial transport medium  Result Value Ref Range Status   SARS Coronavirus 2 by RT PCR NEGATIVE NEGATIVE Final    Comment: (NOTE) SARS-CoV-2 target nucleic acids are NOT DETECTED.  The SARS-CoV-2 RNA is generally detectable in upper respiratory specimens during the acute phase of infection. The  lowest concentration of SARS-CoV-2 viral copies this assay can detect is 138 copies/mL. A negative result does not preclude SARS-Cov-2 infection and should not be used as the sole basis for treatment or other patient management decisions. A negative result may occur with  improper specimen collection/handling, submission of specimen other than nasopharyngeal swab, presence of viral mutation(s) within the areas targeted by this assay, and inadequate number of viral copies(<138 copies/mL). A negative result must be combined with clinical observations, patient history, and epidemiological information. The expected result is Negative.  Fact Sheet for Patients:  EntrepreneurPulse.com.au  Fact Sheet for Healthcare Providers:  IncredibleEmployment.be  This test is no t yet approved or cleared by the [place] FDA and  has been authorized for detection and/or diagnosis of SARS-CoV-2 by FDA under an Emergency Use Authorization (EUA). This EUA will remain  in effect (meaning this test can be used) for the duration of the COVID-19 declaration under Section 564(b)(1) of the Act, 21 U.S.C.section 360bbb-3(b)(1), unless the authorization  is terminated  or revoked sooner.       Influenza A by PCR NEGATIVE NEGATIVE Final   Influenza B by PCR NEGATIVE NEGATIVE Final    Comment: (NOTE) The Xpert Xpress SARS-CoV-2/FLU/RSV plus assay is intended as an aid in the diagnosis of influenza from Nasopharyngeal swab specimens and should not be used as a sole basis for treatment. Nasal washings and aspirates are unacceptable for Xpert Xpress SARS-CoV-2/FLU/RSV testing.  Fact Sheet for Patients: EntrepreneurPulse.com.au  Fact Sheet for Healthcare Providers: IncredibleEmployment.be  This test is not yet approved or cleared by the [place] FDA and has been authorized for detection and/or diagnosis of SARS-CoV-2 by FDA under  an Emergency Use Authorization (EUA). This EUA will remain in effect (meaning this test can be used) for the duration of the COVID-19 declaration under Section 564(b)(1) of the Act, 21 U.S.C. section 360bbb-3(b)(1), unless the authorization is terminated or revoked.  Performed at [facility], [place] [address] [place], [place] [address]   C Difficile Quick Screen w PCR reflex     Status: None   Collection Time: 09/14/21  5:22 PM   Specimen: STOOL  Result Value Ref Range Status   C Diff antigen NEGATIVE NEGATIVE Final   C Diff toxin NEGATIVE NEGATIVE Final   C Diff interpretation No C. difficile detected.  Final    Comment: Performed at [facility], [place] [address], [place], [place] [address]         Radiology Studies: No results found.      Scheduled Meds:  aspirin EC  81 mg Oral Daily   enoxaparin (LOVENOX) injection  40 mg Subcutaneous Q24H   hydrochlorothiazide  25 mg Oral Daily   insulin aspart  0-5 Units Subcutaneous QHS   insulin aspart  0-9 Units Subcutaneous TID WC   insulin aspart  3 Units Subcutaneous TID WC   insulin glargine-yfgn  7 Units Subcutaneous QHS   levothyroxine  112 mcg Oral Q0600   losartan  50 mg Oral Daily   melatonin  3 mg Oral QHS   potassium chloride  40 mEq Oral BID   simvastatin  20 mg Oral QHS     LOS: 4 days    Time spent: 35 minutes    Kirat Mezquita Darleen Crocker, DO [facility]  If 7PM-7AM, please contact night-coverage [email or web address] 09/17/2021, 8:17 AM

## 2021-09-17 NOTE — Consult Note (Signed)
OB/GYN Consult Note  Referring Provider: [place] [name] is a 88 y.o. G3P3, SVD x 3  admitted for hyperglycemia in a diabetic and acute kidney injury. Gyn consulted for presumed vaginal bleeding with clots. Pt is the historian along with her husband who was reached by telephone call.  Pt was somewhat confused during the elicitation of her history.  She noted she had been admitted for 5 days, but only noted the " vaginal" bleeding today.  Per the referring physician they were fairly sure the bleeding was from the vagina and there were clots.  Pt denied any recent history of vaginal bleeding at home.  Per the patient and her spouse she had a vaginal hysterectomy approximately 10 years ago.  I am not clear of the reason for her hysterectomy at the age of 29.  Neither the patient or her spouse could remember the reason for the hysterectomy. Review of chart does suggest abdominal hysterectomy.   Record review in Care Everywhere and her chart do not show an operative note.  There does not appear to be any notes from a gyn oncologist or any chemo/radiation.  If there was any sort of " female" cancer, it likely would have been done with abdominal or laparoscopic/Robotic approach.  Review did show a history of breast cancer which was treated.      Past Medical History:  Diagnosis Date   Anxiety    Arthritis    fingers   Bowel obstruction (HCC)    Cancer of upper-outer quadrant of female breast (HCC) 08/21/2011   ER +  PR +  Her 2 -  Ki67 9%   0.9 cm invasive lobular  Carcinoma ,s/p central lumpectomy with sentinel node biopsy,  ER/PR positive s/p re-excion on 09/16/11 with final pathology showing atypical hyperplasia    CAP (community acquired pneumonia) 12/06/2016   Diabetes mellitus type 1 (HCC)    type I- wears insulin pump   GERD (gastroesophageal reflux disease)    History of small bowel obstruction    Hypercholesteremia    Hypergastrinemia    Hypertension    Hypertension     Hyponatremia    Hypothyroid    Hypothyroidism    IBS (irritable bowel syndrome)    Osteoarthritis    Osteoporosis, post-menopausal    DEXA 12/02/11: -3.5 (with lomax gyn), declines bisphos due to bone pain   PAF (paroxysmal atrial fibrillation) (HCC)    One episode in 2008, spontaneously converted in the ED, no further treatment   Spondylosis    thoracic spine   Thyroid nodule dx 07/2011   [place] q 23mo to follow calcified L thyroid nodule (12/08/11 [place])   Umbilical hernia     Past Surgical History:  Procedure Laterality Date   ABDOMINAL HYSTERECTOMY     ABDOMINAL HYSTERECTOMY  yrs ago   APPENDECTOMY     APPENDECTOMY  age 63   BLADDER REPAIR  5 yrs ago   BREAST LUMPECTOMY     BREAST SURGERY     biopsy   BREAST SURGERY   yrs ago   br bx   BREAST SURGERY  01/ 23/2013   left central lumpectomy and snbx, re-excision lumpectomy   CARDIOVASCULAR STRESS TEST  03/17/2012   colon surgery     Small intestines- took 6 inches out   COLONOSCOPY  07/11/2015   [facility] Dr Alycia Rossetti   ESOPHAGOGASTRODUODENOSCOPY  07/17/2010   [facility]   LEFT HEART CATHETERIZATION WITH CORONARY ANGIOGRAM N/A 03/18/2012  Procedure: LEFT HEART CATHETERIZATION WITH CORONARY ANGIOGRAM;  Surgeon: Minus Breeding, MD;  Location: [facility] CATH LAB;  Service: Cardiovascular;  Laterality: N/A;   TONSILLECTOMY   age 71    OB History  No obstetric history on file.    Social History   Socioeconomic History   Marital status: Married    Spouse name: Not on file   Number of children: Not on file   Years of education: Not on file   Highest education level: Not on file  Occupational History   Not on file  Tobacco Use   Smoking status: Former    Packs/day: 1.00    Years: 10.00    Pack years: 10.00    Types: Cigarettes    Quit date: 08/27/1977    Years since quitting: 44.0   Smokeless tobacco: Never  Vaping Use   Vaping Use: Never used  Substance and Sexual Activity   Alcohol use: Yes    Comment: rare   Drug use: No    Sexual activity: Yes    Birth control/protection: Post-menopausal  Other Topics Concern   Not on file  Social History Narrative   ** Merged History Encounter **       Social Determinants of Health   Financial Resource Strain: Not on file  Food Insecurity: Not on file  Transportation Needs: Not on file  Physical Activity: Not on file  Stress: Not on file  Social Connections: Not on file    Family History  Problem Relation Age of Onset   Cancer Sister        unknown   Hypertension Mother    Stroke Mother    Arthritis Father    Heart disease Father    Diabetes Son    Colon cancer Neg Hx    Esophageal cancer Neg Hx     Medications Prior to Admission  Medication Sig Dispense Refill Last Dose   ALPRAZolam (XANAX) 0.5 MG tablet TAKE 1/2 TABLET TWICE DAILY AS NEEDED FOR ANXIETY OR SLEEP (Patient taking differently: Take 0.25 mg by mouth 2 (two) times daily as needed for anxiety or sleep.) 30 tablet 5    aspirin EC 81 MG tablet Take 81 mg by mouth daily.      B Complex-C (SUPER B COMPLEX PO) Take 1 tablet by mouth daily.      BD VEO INSULIN SYRINGE U/F 31G X 15/64" 0.3 ML MISC USE WITH INJECTIONS 100 each 2    bisacodyl (DULCOLAX) 10 MG suppository Place 1 suppository (10 mg total) rectally as needed for moderate constipation. 12 suppository 0    carboxymethylcellulose (REFRESH PLUS) 0.5 % SOLN Place 1 drop into both eyes 3 (three) times daily as needed (dry eyes). Non-preservative      cholecalciferol (VITAMIN D) 1000 UNITS tablet Take 5,000 Units by mouth daily.       DUREZOL 0.05 % EMUL Place 1 drop into the right eye 3 (three) times daily.      fish oil-omega-3 fatty acids 1000 MG capsule Take 2 g by mouth daily.      hydrochlorothiazide (HYDRODIURIL) 25 MG tablet TAKE (1) TABLET DAILY AS NEEDED. (Patient taking differently: Take 25 mg by mouth daily as needed.) 90 tablet 0    insulin glargine (LANTUS) 100 UNIT/ML injection Inject 8 Units into the skin at bedtime.       insulin lispro (HUMALOG) 100 UNIT/ML KiwkPen Inject 0-12 Units into the skin with breakfast, with lunch, and with evening meal. Per sliding scale, max of  35 units per day      levothyroxine (SYNTHROID) 112 MCG tablet Take 112 mcg by mouth daily.      losartan (COZAAR) 100 MG tablet TAKE 1 TABLET ONCE DAILY. (Patient taking differently: Take 100 mg by mouth daily.) 90 tablet 0    methocarbamol (ROBAXIN) 500 MG tablet Take 1 tablet (500 mg total) by mouth at bedtime as needed for muscle spasms. 30 tablet 5    metoprolol succinate (TOPROL-XL) 25 MG 24 hr tablet TAKE 1/2 TABLET AS NEEDED FOR PALPITATIONS. (Patient taking differently: Take 12.5 mg by mouth daily as needed (heart palpitations).) 45 tablet 3    Multiple Vitamin (MULTIVITAMIN) tablet Take 1 tablet by mouth daily. For breast and bone health      ondansetron (ZOFRAN) 4 MG tablet Take 1 tablet (4 mg total) by mouth every 8 (eight) hours as needed for nausea or vomiting. 20 tablet 0    potassium chloride SA (KLOR-CON M) 20 MEQ tablet Take 1 tablet (20 mEq total) by mouth 2 (two) times daily. 10 tablet 0    saccharomyces boulardii (FLORASTOR) 250 MG capsule Take 1 capsule (250 mg total) by mouth 2 (two) times daily. 60 capsule 5    simvastatin (ZOCOR) 20 MG tablet TAKE ONE TABLET AT BEDTIME. (Patient taking differently: Take 20 mg by mouth at bedtime.) 90 tablet 1    traMADol (ULTRAM) 50 MG tablet Take 1 tablet (50 mg total) by mouth every 8 (eight) hours as needed for severe pain (lumbar compression fractures). 15 tablet 0     Allergies  Allergen Reactions   Eliquis [Apixaban] Other (See Comments)    Dizziness, severe headach   Amlodipine     Dizzy, nausea   Augmentin [Amoxicillin-Pot Clavulanate] Other (See Comments)    Gi upset only no rash or hives    Cortisone Other (See Comments)    Makes blood sugars run high   Keflex [Cephalexin] Hives and Nausea Only    Stomach upset   Omeprazole Nausea Only    Dizziness and nausea    Prednisone     Other reaction(s): Other Makes blood sugars run high   Hydrocodone Rash    Rash to chest , side back   Latex Rash    Powder in gloves causes rash; "not allergic to latex just the powder"    Review of Systems: Negative except for what is mentioned in HPI.     Physical Exam: BP (!) 170/73 (BP Location: Right Arm)    Pulse 78    Temp 97.8 F (36.6 C) (Oral)    Resp 18    Ht $R'5\' 7"'Dg$  (1.702 m)    Wt 53.2 kg    SpO2 96%    BMI 18.37 kg/m  CONSTITUTIONAL: frail appearing female in no acute distress.  HENT:  Normocephalic, atraumatic, External right and left ear normal. Oropharynx is clear and moist EYES: Conjunctivae and EOM are normal.  NECK: Normal range of motion, supple, no masses SKIN: Skin is warm and dry. No rash noted. Not diaphoretic. No erythema. No pallor. [place]: Alert and oriented to person, place, and time. Normal reflexes, muscle tone coordination. No cranial nerve deficit noted. PSYCHIATRIC: Normal mood and affect. Normal behavior. Normal judgment and thought content. CARDIOVASCULAR: Normal heart rate noted, regular rhythm RESPIRATORY: Effort and breath sounds normal, no problems with respiration noted ABDOMEN: Soft, nontender, nondistended,  PELVIC: SSE: atrophic appearing vaginal mucosa.  No active bleeding and no old blood noted in the vault.  Well healed and  approximated vaginal cuff.  Absent cervix and no abnormalities of the vaginal wall.   No abrasions or vaginal lacerations noted. SVE: Some scarring noted at the vaginal cuff, absent uterus and cervix.  No defects or mass noted.   Of note there did appear there may be some small hemorrhoids noted.  MUSCULOSKELETAL: grossly normal for a patient at her age and condition  Pelvic exam done with  chaperone present.  Pertinent Labs/Studies:   Results for orders placed or performed during the hospital encounter of 09/12/21 (from the past 72 hour(s))  Glucose, capillary     Status: Abnormal   Collection  Time: 09/14/21  9:29 PM  Result Value Ref Range   Glucose-Capillary 103 (H) 70 - 99 mg/dL    Comment: Glucose reference range applies only to samples taken after fasting for at least 8 hours.  Glucose, capillary     Status: Abnormal   Collection Time: 09/14/21 10:59 PM  Result Value Ref Range   Glucose-Capillary 206 (H) 70 - 99 mg/dL    Comment: Glucose reference range applies only to samples taken after fasting for at least 8 hours.  Glucose, capillary     Status: Abnormal   Collection Time: 09/15/21  2:10 AM  Result Value Ref Range   Glucose-Capillary 238 (H) 70 - 99 mg/dL    Comment: Glucose reference range applies only to samples taken after fasting for at least 8 hours.  Basic metabolic panel     Status: Abnormal   Collection Time: 09/15/21  4:52 AM  Result Value Ref Range   Sodium 127 (L) 135 - 145 mmol/L   Potassium 3.5 3.5 - 5.1 mmol/L   Chloride 92 (L) 98 - 111 mmol/L   CO2 23 22 - 32 mmol/L   Glucose, Bld 238 (H) 70 - 99 mg/dL    Comment: Glucose reference range applies only to samples taken after fasting for at least 8 hours.   BUN 10 8 - 23 mg/dL   Creatinine, Ser 0.79 0.44 - 1.00 mg/dL   Calcium 9.5 8.9 - 10.3 mg/dL   GFR, Estimated >60 >60 mL/min    Comment: (NOTE) Calculated using the CKD-EPI Creatinine Equation (2021)    Anion gap 12 5 - 15    Comment: Performed at [facility] [address], [place], [place] [address]  CBC     Status: None   Collection Time: 09/15/21  4:52 AM  Result Value Ref Range   WBC 6.9 4.0 - 10.5 K/uL   RBC 4.14 3.87 - 5.11 MIL/uL   Hemoglobin 12.6 12.0 - 15.0 g/dL   HCT 36.3 36.0 - 46.0 %   MCV 87.7 80.0 - 100.0 fL   MCH 30.4 26.0 - 34.0 pg   MCHC 34.7 30.0 - 36.0 g/dL   RDW 13.5 11.5 - 15.5 %   Platelets 226 150 - 400 K/uL   nRBC 0.0 0.0 - 0.2 %    Comment: Performed at [facility], [place] [address], [place], [place] [address]  Glucose, capillary     Status: Abnormal   Collection Time: 09/15/21  7:50 AM  Result  Value Ref Range   Glucose-Capillary 320 (H) 70 - 99 mg/dL    Comment: Glucose reference range applies only to samples taken after fasting for at least 8 hours.  Glucose, capillary     Status: Abnormal   Collection Time: 09/15/21 11:40 AM  Result Value Ref Range   Glucose-Capillary 184 (H) 70 - 99 mg/dL  Comment: Glucose reference range applies only to samples taken after fasting for at least 8 hours.  Glucose, capillary     Status: Abnormal   Collection Time: 09/15/21  4:35 PM  Result Value Ref Range   Glucose-Capillary 137 (H) 70 - 99 mg/dL    Comment: Glucose reference range applies only to samples taken after fasting for at least 8 hours.  Glucose, capillary     Status: Abnormal   Collection Time: 09/15/21  9:46 PM  Result Value Ref Range   Glucose-Capillary 143 (H) 70 - 99 mg/dL    Comment: Glucose reference range applies only to samples taken after fasting for at least 8 hours.  Basic metabolic panel     Status: Abnormal   Collection Time: 09/15/21 11:31 PM  Result Value Ref Range   Sodium 129 (L) 135 - 145 mmol/L   Potassium 4.3 3.5 - 5.1 mmol/L    Comment: NO VISIBLE HEMOLYSIS   Chloride 96 (L) 98 - 111 mmol/L   CO2 22 22 - 32 mmol/L   Glucose, Bld 195 (H) 70 - 99 mg/dL    Comment: Glucose reference range applies only to samples taken after fasting for at least 8 hours.   BUN 9 8 - 23 mg/dL   Creatinine, Ser 7.71 0.44 - 1.00 mg/dL   Calcium 9.8 8.9 - 41.4 mg/dL   GFR, Estimated >92 >70 mL/min    Comment: (NOTE) Calculated using the CKD-EPI Creatinine Equation (2021)    Anion gap 11 5 - 15    Comment: Performed at [facility], [address], [place], [place] [address]  Glucose, capillary     Status: Abnormal   Collection Time: 09/16/21  7:44 AM  Result Value Ref Range   Glucose-Capillary 239 (H) 70 - 99 mg/dL    Comment: Glucose reference range applies only to samples taken after fasting for at least 8 hours.  Glucose, capillary     Status: Abnormal    Collection Time: 09/16/21 11:27 AM  Result Value Ref Range   Glucose-Capillary 215 (H) 70 - 99 mg/dL    Comment: Glucose reference range applies only to samples taken after fasting for at least 8 hours.  Glucose, capillary     Status: Abnormal   Collection Time: 09/16/21  4:56 PM  Result Value Ref Range   Glucose-Capillary 138 (H) 70 - 99 mg/dL    Comment: Glucose reference range applies only to samples taken after fasting for at least 8 hours.  Glucose, capillary     Status: Abnormal   Collection Time: 09/16/21  9:22 PM  Result Value Ref Range   Glucose-Capillary 164 (H) 70 - 99 mg/dL    Comment: Glucose reference range applies only to samples taken after fasting for at least 8 hours.  Glucose, capillary     Status: Abnormal   Collection Time: 09/17/21  8:20 AM  Result Value Ref Range   Glucose-Capillary 302 (H) 70 - 99 mg/dL    Comment: Glucose reference range applies only to samples taken after fasting for at least 8 hours.  Urinalysis, Routine w reflex microscopic     Status: Abnormal   Collection Time: 09/17/21  9:30 AM  Result Value Ref Range   Color, Urine STRAW (A) YELLOW   APPearance HAZY (A) CLEAR   Specific Gravity, Urine 1.008 1.005 - 1.030   pH 8.0 5.0 - 8.0   Glucose, UA 150 (A) NEGATIVE mg/dL   Hgb urine dipstick LARGE (A) NEGATIVE  Bilirubin Urine NEGATIVE NEGATIVE   Ketones, ur NEGATIVE NEGATIVE mg/dL   Protein, ur NEGATIVE NEGATIVE mg/dL   Nitrite NEGATIVE NEGATIVE   Leukocytes,Ua NEGATIVE NEGATIVE   RBC / HPF >50 (H) 0 - 5 RBC/hpf   WBC, UA 6-10 0 - 5 WBC/hpf   Bacteria, UA NONE SEEN NONE SEEN   Squamous Epithelial / LPF 0-5 0 - 5    Comment: Performed at [facility], [place] [address] [place], [place] [address]  Glucose, capillary     Status: Abnormal   Collection Time: 09/17/21 11:27 AM  Result Value Ref Range   Glucose-Capillary 195 (H) 70 - 99 mg/dL    Comment: Glucose reference range applies only to samples taken after fasting for at least 8  hours.  Glucose, capillary     Status: Abnormal   Collection Time: 09/17/21  4:36 PM  Result Value Ref Range   Glucose-Capillary 141 (H) 70 - 99 mg/dL    Comment: Glucose reference range applies only to samples taken after fasting for at least 8 hours.       Assessment and Plan :MAZEL VILLELA is a 88 y.o. G3P3. admitted for hyperglycemia  Per my understanding, today was the first day the vaginal bleeding was noted. From the exam, I am not convinced the bleeding came from the vagina due to the complete and total absence of a lesion or old blood in the vault.  CT scan does confirm absence of uterus or pelvic mass.   The patient does have vaginal atrophy which is to be expected, but I would not expect this to produce enough bleeding to pass clots.   I am suspicious of a renal or GI source of the bleeding.  Pt does appear to have some hemorrhoids, and I am curious were they  the source.  My recommendation is continued observation at this time.  I would suggest reconsultation if there is a reoccurrence of bleeding so the patient can be evaluated to find the source.   Thank you for this consult, we will sign off, please call or re-consult with further questions.   For OB/GYN questions, please call the [facility] [place] [facility] [place] Monday - Friday, 8 am - 5 pm: [phone] All other times: [phone]    Lynnda Shields, M.D. Attending [place], [facility] for [employer], [place]

## 2021-09-18 DIAGNOSIS — E876 Hypokalemia: Secondary | ICD-10-CM

## 2021-09-18 DIAGNOSIS — E1165 Type 2 diabetes mellitus with hyperglycemia: Secondary | ICD-10-CM | POA: Diagnosis not present

## 2021-09-18 LAB — CBC
HCT: 33.4 % — ABNORMAL LOW (ref 36.0–46.0)
Hemoglobin: 11.6 g/dL — ABNORMAL LOW (ref 12.0–15.0)
MCH: 29.9 pg (ref 26.0–34.0)
MCHC: 34.7 g/dL (ref 30.0–36.0)
MCV: 86.1 fL (ref 80.0–100.0)
Platelets: 226 10*3/uL (ref 150–400)
RBC: 3.88 MIL/uL (ref 3.87–5.11)
RDW: 13.2 % (ref 11.5–15.5)
WBC: 7.6 10*3/uL (ref 4.0–10.5)
nRBC: 0 % (ref 0.0–0.2)

## 2021-09-18 LAB — BASIC METABOLIC PANEL
Anion gap: 11 (ref 5–15)
BUN: 10 mg/dL (ref 8–23)
CO2: 23 mmol/L (ref 22–32)
Calcium: 9.5 mg/dL (ref 8.9–10.3)
Chloride: 89 mmol/L — ABNORMAL LOW (ref 98–111)
Creatinine, Ser: 0.79 mg/dL (ref 0.44–1.00)
GFR, Estimated: >60 mL/min (ref >60)
Glucose, Bld: 211 mg/dL — ABNORMAL HIGH (ref 70–99)
Potassium: 3.4 mmol/L — ABNORMAL LOW (ref 3.5–5.1)
Sodium: 123 mmol/L — ABNORMAL LOW (ref 135–145)

## 2021-09-18 LAB — GLUCOSE, CAPILLARY
Glucose-Capillary: 293 mg/dL — ABNORMAL HIGH (ref 70–99)
Glucose-Capillary: 206 mg/dL — ABNORMAL HIGH (ref 70–99)
Glucose-Capillary: 83 mg/dL (ref 70–99)
Glucose-Capillary: 114 mg/dL — ABNORMAL HIGH (ref 70–99)

## 2021-09-18 LAB — SODIUM, URINE, RANDOM: Sodium, Ur: 134 mmol/L

## 2021-09-18 LAB — URINE CULTURE: Culture: <10000 — AB

## 2021-09-18 LAB — SODIUM
Sodium: 124 mmol/L — ABNORMAL LOW (ref 135–145)
Sodium: 121 mmol/L — ABNORMAL LOW (ref 135–145)

## 2021-09-18 LAB — TSH: TSH: >500 u[IU]/mL — ABNORMAL HIGH (ref 0.350–4.500)

## 2021-09-18 LAB — OSMOLALITY, URINE: Osmolality, Ur: 441 mOsm/kg (ref 300–900)

## 2021-09-18 LAB — MAGNESIUM: Magnesium: 1.3 mg/dL — ABNORMAL LOW (ref 1.7–2.4)

## 2021-09-18 LAB — OSMOLALITY: Osmolality: 267 mOsm/kg — ABNORMAL LOW (ref 275–295)

## 2021-09-18 MED ORDER — SODIUM CHLORIDE 0.9 % IV SOLN: INTRAVENOUS | Status: DC

## 2021-09-18 MED ORDER — METOPROLOL TARTRATE 25 MG PO TABS
25.0000 mg | ORAL_TABLET | Freq: Two times a day (BID) | ORAL | Status: DC
  Administered 2021-09-18 – 2021-09-24 (×11): 25 mg via ORAL
  Filled 2021-09-18 (×12): qty 1

## 2021-09-18 MED ORDER — MAGNESIUM SULFATE 2 GM/50ML IV SOLN
2.0000 g | Freq: Once | INTRAVENOUS | Status: AC
  Administered 2021-09-18: 2 g via INTRAVENOUS
  Filled 2021-09-18: qty 50

## 2021-09-18 NOTE — Progress Notes (Signed)
Physical Therapy Treatment Patient Details Name: [name] MRN: [id number] DOB: 03/27/1929 Today's Date: 09/18/2021   History of Present Illness 88 y/o female presented to ED on 09/12/21 for confusion and hyperglycemia. CT abdomen showed worsening acute compression fx of L1, worsening of R kidney mass. PMH: HTN, DM, hypothyroidism, Afib    PT Comments    Patient continues to be limited by impaired cognition. Patient oriented to self. Patient required modA to stand from EOB and modA to take steps at EOB due to reaching for objects, difficulty advancing feet, and RW management. Patient reporting "I'm burning up" once back in supine. Continue to recommend SNF for ongoing Physical Therapy.       Recommendations for follow up therapy are one component of a multi-disciplinary discharge planning process, led by the attending physician.  Recommendations may be updated based on patient status, additional functional criteria and insurance authorization.  Follow Up Recommendations  Skilled nursing-short term rehab (<3 hours/day)     Assistance Recommended at Discharge Intermittent Supervision/Assistance  Patient can return home with the following A little help with walking and/or transfers;A little help with bathing/dressing/bathroom;Assist for transportation;Help with stairs or ramp for entrance   Equipment Recommendations  Rolling Monica Codd (2 wheels)    Recommendations for Other Services       Precautions / Restrictions Precautions Precautions: Fall Restrictions Weight Bearing Restrictions: No     Mobility  Bed Mobility Overal bed mobility: Needs Assistance Bed Mobility: Supine to Sit, Sit to Supine     Supine to sit: Min assist Sit to supine: Total assist   General bed mobility comments: minA for trunk elevation and repositioning to EOB    Transfers Overall transfer level: Needs assistance Equipment used: Rolling Audris Speaker (2 wheels) Transfers: Sit to/from Stand Sit to Stand:  Mod assist           General transfer comment: modA to stand from low bed surface. Constant cueing for hand placement    Ambulation/Gait Ambulation/Gait assistance: Mod assist Gait Distance (Feet): 1 Feet Assistive device: Rolling Riyanna Crutchley (2 wheels) Gait Pattern/deviations: Step-to pattern Gait velocity: decreased     General Gait Details: modA for balance and RW management. Patient reaching for objects, not following commands. Repeatedly stating "i'm burning up, I need to grab that". Difficulty advancing LEs and returning to sitting without warning   Stairs             Wheelchair Mobility    Modified Rankin (Stroke Patients Only)       Balance Overall balance assessment: Needs assistance Sitting-balance support: Feet supported Sitting balance-Leahy Scale: Fair     Standing balance support: Bilateral upper extremity supported, During functional activity, Reliant on assistive device for balance Standing balance-Leahy Scale: Poor Standing balance comment: reliant on UE support of RW                            Cognition Arousal/Alertness: Awake/alert Behavior During Therapy: WFL for tasks assessed/performed Overall Cognitive Status: Impaired/Different from baseline Area of Impairment: Orientation, Following commands, Memory, Safety/judgement, Awareness                 Orientation Level: Disoriented to, Time, Situation, Place Current Attention Level: Sustained Memory: Decreased short-term memory Following Commands: Follows one step commands inconsistently, Follows one step commands with increased time Safety/Judgement: Decreased awareness of safety Awareness: Intellectual Problem Solving: Slow processing, Requires verbal cues, Requires tactile cues  Exercises      General Comments        Pertinent Vitals/Pain Pain Assessment Pain Assessment: Faces Faces Pain Scale: Hurts little more Pain Location: generalized with  movement Pain Descriptors / Indicators: Grimacing Pain Intervention(s): Limited activity within patient's tolerance, Monitored during session    Home Living                          Prior Function            PT Goals (current goals can now be found in the care plan section) Acute Rehab PT Goals Patient Stated Goal: to go to rehab PT Goal Formulation: With family Time For Goal Achievement: 09/28/21 Potential to Achieve Goals: Good Progress towards PT goals: Progressing toward goals    Frequency    Min 2X/week      PT Plan Current plan remains appropriate    Co-evaluation              AM-PAC PT "6 Clicks" Mobility   Outcome Measure  Help needed turning from your back to your side while in a flat bed without using bedrails?: A Little Help needed moving from lying on your back to sitting on the side of a flat bed without using bedrails?: A Little Help needed moving to and from a bed to a chair (including a wheelchair)?: A Lot Help needed standing up from a chair using your arms (e.g., wheelchair or bedside chair)?: A Lot Help needed to walk in hospital room?: A Lot Help needed climbing 3-5 steps with a railing? : Total 6 Click Score: 13    End of Session Equipment Utilized During Treatment: Gait belt Activity Tolerance: Other (comment) (Limited by cognition) Patient left: in bed;with call bell/phone within reach;with bed alarm set Nurse Communication: Mobility status PT Visit Diagnosis: Difficulty in walking, not elsewhere classified (R26.2);Unsteadiness on feet (R26.81);Muscle weakness (generalized) (M62.81)     Time: 7048-8891 PT Time Calculation (min) (ACUTE ONLY): 17 min  Charges:  $Gait Training: 8-22 mins                     Darrius Montano A. Gilford Rile PT, DPT Acute Rehabilitation Services Pager [phone] Office [phone]    Linna Hoff 09/18/2021, 3:04 PM

## 2021-09-18 NOTE — Progress Notes (Signed)
Inpatient Diabetes Program Recommendations  AACE/ADA: New Consensus Statement on Inpatient Glycemic Control (2015)  Target Ranges:  Prepandial:   less than 140 mg/dL      Peak postprandial:   less than 180 mg/dL (1-2 hours)      Critically ill patients:  140 - 180 mg/dL   Lab Results  Component Value Date   GLUCAP 293 (H) 09/18/2021   HGBA1C 6.9 (H) 09/13/2021    Review of Glycemic Control Diabetes history: DM2 Outpatient Diabetes medications: Lantus 14 units QD, Humalog 0-12 units TID Current orders for Inpatient glycemic control: Semglee 7 units QD, Novolog 0-9 units TID, Novolog 3 units TID   Inpatient Diabetes Program Recommendations:     Consider increasing Semglee to 10 units QD.   Thanks, Bronson Curb, MSN, RNC-OB Diabetes Coordinator [phone] (8a-5p)

## 2021-09-18 NOTE — Hospital Course (Signed)
History of PAF refused anticoagulation in the past, insulin-dependent type 2 diabetes, hypertension, known right renal mass with intermittent hematuria, followed with urology in [place], recent diagnosis of compression fracture L2 in January 2023 presented with confusion found to have hyperglycemia, report glucometer malfunction, also have AKI

## 2021-09-18 NOTE — Assessment & Plan Note (Addendum)
replaced

## 2021-09-18 NOTE — TOC Progression Note (Signed)
Transition of Care [facility]) - Initial/Assessment Note    Patient Details  Name: [name] MRN: [id number] Date of Birth: 02/20/1929  Transition of Care [facility]) CM/SW Contact:    [name], [place] Phone Number: 09/18/2021, 10:02 AM  Clinical Narrative:                 CSW notified by RN that patient is not medically ready to d/c.  CWS contacted Olivia Mackie with admissions at [facility] and informed the facility of the delay with d/c.    Expected Discharge Plan: Skilled Nursing Facility Barriers to Discharge: Insurance Authorization, SNF Pending bed offer   Patient Goals and CMS Choice Patient states their goals for this hospitalization and ongoing recovery are:: To get home CMS Medicare.gov Compare Post Acute Care list provided to:: Patient Represenative (must comment) Choice offered to / list presented to : Spouse  Expected Discharge Plan and Services Expected Discharge Plan: [place]       Living arrangements for the past 2 months: Single Family Home                                      Prior Living Arrangements/Services Living arrangements for the past 2 months: Single Family Home Lives with:: Self, Spouse Patient language and need for interpreter reviewed:: Yes Do you feel safe going back to the place where you live?: Yes      Need for Family Participation in Patient Care: Yes (Comment) Care giver support system in place?: Yes (comment)   Criminal Activity/Legal Involvement Pertinent to Current Situation/Hospitalization: No - Comment as needed  Activities of Daily Living      Permission Sought/Granted   Permission granted to share information with : Yes, Release of Information Signed     Permission granted to share info w AGENCY: SNFs  Permission granted to share info w Relationship: Saulnier,Paul C (Spouse)     Emotional Assessment Appearance:: Appears stated age Attitude/Demeanor/Rapport: Gracious Affect (typically observed):  Calm Orientation: : Oriented to Self, Oriented to Place Alcohol / Substance Use: Not Applicable Psych Involvement: No (comment)  Admission diagnosis:  Diabetic ketoacidosis without coma associated with type 1 diabetes mellitus ([place]) [E10.10] Hyperglycemia due to diabetes mellitus ([place]) [E11.65] Patient Active Problem List   Diagnosis Date Noted   Hypokalemia 09/18/2021   Hypomagnesemia 09/18/2021   Hyperglycemia due to diabetes mellitus ([place]) - nonadherence to home therapy d/t glucometer malfunction, assoc w/ ketosis 09/12/2021   AKI (acute kidney injury) ([place]) 09/12/2021   History of small bowel obstruction 08/19/2021   Menopause present 60/63/0160   Umbilical hernia 10/93/2355   Atrophy of vagina 08/19/2021   Compression fracture of L1 lumbar vertebra ([place]) 08/18/2021   Compression fracture of L2 ([place]) 08/18/2021   Black stools 08/13/2021   Right renal mass 06/30/2021   Lower back pain 06/30/2021   Acute cystitis 05/05/2021   Anxiety 12/31/2020   Pain in left foot 03/11/2020   Left leg swelling 03/05/2020   B12 deficiency 12/15/2019   Tingling in extremities 10/04/2019   Compression fracture of thoracic vertebra ([place]) 08/21/2019   Osteoporosis 08/21/2019   Chronic thoracic back pain 08/16/2019   History of Atrial fibrillation ([place]) - currently sinus rhythm 02/01/2018   Leg cramps 07/05/2017   Constipation 01/25/2017   Closed fracture of proximal end of right humerus 07/09/2016   Cough 06/17/2016   Abdominal pain 03/10/2016   Hearing loss 01/03/2016  Allergic rhinitis 01/03/2016   Minimal Coronary Plaque by Cardiac Cath in 2013 09/11/2015   Frequent loose stools 08/28/2015   Altered bowel habits 09/26/2014   Hyponatremia    Acute mesenteric ischemia ([place])    Hypothyroidism 07/11/2012   Syncope 03/18/2012   Hypercholesteremia    Hypertension    Diabetes mellitus type 1 ([place])    Osteoarthritis    Thyroid nodule    Osteoporosis, post-menopausal    Malignant neoplasm  of breast ([place]) 11/10/2011   Primary cancer of upper outer quadrant of left female breast ([place]) 08/21/2011   PCP:  Binnie Rail, MD Pharmacy:   [facility], [place] [address] Phone: [phone] Fax: [phone]  [id number] - [place], [address] AT [place] [address] [place] [address] Phone: [phone] Fax: [phone]     Social Determinants of Health (SDOH) Interventions    Readmission Risk Interventions No flowsheet data found.

## 2021-09-18 NOTE — Consult Note (Signed)
Urology Consult   Physician requesting consult: Florencia Reasons, MD  Reason for consult: Gross hematuria, right upper tract urothelial neoplasm  History of Present Illness:  [name] is a 88 year old female who is a patient of Dr. Milford Cage who is seen in consultation today for hematuria and presumable right upper tract urothelial carcinoma.  Of note, she was last seen by Dr. Milford Cage in the office in 06/2021 with CT findings suggestive of a right upper tract urothelial carcinoma along with a positive cytology suspicious for high-grade urothelial carcinoma.  He offered cystoscopy, right ureteroscopy with biopsy.  It looks like that procedure was scheduled however she canceled as she did not want to have any further intervention.  She presented to the hospital with hyperglycemia due to diabetes poorly controlled.  She was also found to have acute metabolic encephalopathy.  She was also found to have hyponatremia.  While she was hospitalized, she was found to have gross hematuria.  CT A/P 09/12/2021 demonstrated persistent and slightly worsened process in the right kidney which could represent premature disease or mass.  Subsequent MRI abdomen 09/18/2021 demonstrated a lesion in the upper pole of the right kidney highly concerning for an upper tract urothelial neoplasm.  There is no definite lymphadenopathy or signs of metastatic disease.  Her hemoglobin remained stable at 11.6. She states that she states she has been having darker colored urine which she estimates to be pink to red.  She denies passing clots.  She denies sensation of incomplete bladder emptying.  She states she has been voiding without difficulty.  1.7L was reported in urine output yesterday however was probably recorded today.  There is about 300 mL recorded today however she has had multiple unrecorded voids.  I did asked the nurses to bladder scan and apparently that was done however not documented.   Past Medical History:  Diagnosis Date    Anxiety    Arthritis    fingers   Bowel obstruction ([place])    Cancer of upper-outer quadrant of female breast ([place]) 08/21/2011   ER +  PR +  Her 2 -  Ki67 9%   0.9 cm invasive lobular  Carcinoma ,s/p central lumpectomy with sentinel node biopsy,  ER/PR positive s/p re-excion on 09/16/11 with final pathology showing atypical hyperplasia    CAP (community acquired pneumonia) 12/06/2016   Diabetes mellitus type 1 ([place])    type I- wears insulin pump   GERD (gastroesophageal reflux disease)    History of small bowel obstruction    Hypercholesteremia    Hypergastrinemia    Hypertension    Hypertension    Hyponatremia    Hypothyroid    Hypothyroidism    IBS (irritable bowel syndrome)    Osteoarthritis    Osteoporosis, post-menopausal    DEXA 12/02/11: -3.5 (with lomax gyn), declines bisphos due to bone pain   PAF (paroxysmal atrial fibrillation) ([place])    One episode in 2008, spontaneously converted in the ED, no further treatment   Spondylosis    thoracic spine   Thyroid nodule dx 07/2011   [place] q 89mo to follow calcified L thyroid nodule (12/03/81 [place])   Umbilical hernia     Past Surgical History:  Procedure Laterality Date   ABDOMINAL HYSTERECTOMY     ABDOMINAL HYSTERECTOMY  yrs ago   APPENDECTOMY     APPENDECTOMY  age 37   BLADDER REPAIR  5 yrs ago   BREAST LUMPECTOMY     BREAST SURGERY     biopsy  BREAST SURGERY   yrs ago   br bx   BREAST SURGERY  01/ 23/2013   left central lumpectomy and snbx, re-excision lumpectomy   CARDIOVASCULAR STRESS TEST  03/17/2012   colon surgery     Small intestines- took 6 inches out   COLONOSCOPY  07/11/2015   [facility] Dr Derrill Kay   ESOPHAGOGASTRODUODENOSCOPY  07/17/2010   [facility]   LEFT HEART CATHETERIZATION WITH CORONARY ANGIOGRAM N/A 03/18/2012   Procedure: LEFT HEART CATHETERIZATION WITH CORONARY ANGIOGRAM;  Surgeon: Minus Breeding, MD;  Location: [facility] CATH LAB;  Service: Cardiovascular;  Laterality: N/A;   TONSILLECTOMY   age 37      Current Hospital Medications:  Home meds:  No current facility-administered medications on file prior to encounter.   Current Outpatient Medications on File Prior to Encounter  Medication Sig Dispense Refill   ALPRAZolam (XANAX) 0.5 MG tablet TAKE 1/2 TABLET TWICE DAILY AS NEEDED FOR ANXIETY OR SLEEP (Patient taking differently: Take 0.25 mg by mouth 2 (two) times daily as needed for anxiety or sleep.) 30 tablet 5   aspirin EC 81 MG tablet Take 81 mg by mouth daily.     B Complex-C (SUPER B COMPLEX PO) Take 1 tablet by mouth daily.     BD VEO INSULIN SYRINGE U/F 31G X 15/64" 0.3 ML MISC USE WITH INJECTIONS 100 each 2   bisacodyl (DULCOLAX) 10 MG suppository Place 1 suppository (10 mg total) rectally as needed for moderate constipation. 12 suppository 0   carboxymethylcellulose (REFRESH PLUS) 0.5 % SOLN Place 1 drop into both eyes 3 (three) times daily as needed (dry eyes). Non-preservative     cholecalciferol (VITAMIN D) 1000 UNITS tablet Take 5,000 Units by mouth daily.      DUREZOL 0.05 % EMUL Place 1 drop into the right eye 3 (three) times daily.     fish oil-omega-3 fatty acids 1000 MG capsule Take 2 g by mouth daily.     hydrochlorothiazide (HYDRODIURIL) 25 MG tablet TAKE (1) TABLET DAILY AS NEEDED. (Patient taking differently: Take 25 mg by mouth daily as needed.) 90 tablet 0   insulin glargine (LANTUS) 100 UNIT/ML injection Inject 8 Units into the skin at bedtime.     insulin lispro (HUMALOG) 100 UNIT/ML KiwkPen Inject 0-12 Units into the skin with breakfast, with lunch, and with evening meal. Per sliding scale, max of 35 units per day     levothyroxine (SYNTHROID) 112 MCG tablet Take 112 mcg by mouth daily.     losartan (COZAAR) 100 MG tablet TAKE 1 TABLET ONCE DAILY. (Patient taking differently: Take 100 mg by mouth daily.) 90 tablet 0   methocarbamol (ROBAXIN) 500 MG tablet Take 1 tablet (500 mg total) by mouth at bedtime as needed for muscle spasms. 30 tablet 5   metoprolol  succinate (TOPROL-XL) 25 MG 24 hr tablet TAKE 1/2 TABLET AS NEEDED FOR PALPITATIONS. (Patient taking differently: Take 12.5 mg by mouth daily as needed (heart palpitations).) 45 tablet 3   Multiple Vitamin (MULTIVITAMIN) tablet Take 1 tablet by mouth daily. For breast and bone health     ondansetron (ZOFRAN) 4 MG tablet Take 1 tablet (4 mg total) by mouth every 8 (eight) hours as needed for nausea or vomiting. 20 tablet 0   potassium chloride SA (KLOR-CON M) 20 MEQ tablet Take 1 tablet (20 mEq total) by mouth 2 (two) times daily. 10 tablet 0   saccharomyces boulardii (FLORASTOR) 250 MG capsule Take 1 capsule (250 mg total) by mouth 2 (two)  times daily. 60 capsule 5   simvastatin (ZOCOR) 20 MG tablet TAKE ONE TABLET AT BEDTIME. (Patient taking differently: Take 20 mg by mouth at bedtime.) 90 tablet 1   traMADol (ULTRAM) 50 MG tablet Take 1 tablet (50 mg total) by mouth every 8 (eight) hours as needed for severe pain (lumbar compression fractures). 15 tablet 0     Scheduled Meds:  insulin aspart  0-5 Units Subcutaneous QHS   insulin aspart  0-9 Units Subcutaneous TID WC   insulin aspart  3 Units Subcutaneous TID WC   insulin glargine-yfgn  7 Units Subcutaneous QHS   levothyroxine  112 mcg Oral Q0600   melatonin  3 mg Oral QHS   metoprolol tartrate  25 mg Oral BID   potassium chloride  40 mEq Oral BID   simvastatin  20 mg Oral QHS   Continuous Infusions:  sodium chloride 50 mL/hr at 09/18/21 1812   PRN Meds:.dextrose, hydrALAZINE, methocarbamol, metoprolol succinate, simethicone  Allergies:  Allergies  Allergen Reactions   Eliquis [Apixaban] Other (See Comments)    Dizziness, severe headach   Amlodipine     Dizzy, nausea   Augmentin [Amoxicillin-Pot Clavulanate] Other (See Comments)    Gi upset only no rash or hives    Cortisone Other (See Comments)    Makes blood sugars run high   Keflex [Cephalexin] Hives and Nausea Only    Stomach upset   Omeprazole Nausea Only    Dizziness  and nausea   Prednisone     Other reaction(s): Other Makes blood sugars run high   Hydrocodone Rash    Rash to chest , side back   Latex Rash    Powder in gloves causes rash; "not allergic to latex just the powder"    Family History  Problem Relation Age of Onset   Cancer Sister        unknown   Hypertension Mother    Stroke Mother    Arthritis Father    Heart disease Father    Diabetes Son    Colon cancer Neg Hx    Esophageal cancer Neg Hx     Social History:  reports that she quit smoking about 44 years ago. Her smoking use included cigarettes. She has a 10.00 pack-year smoking history. She has never used smokeless tobacco. She reports current alcohol use. She reports that she does not use drugs.  ROS: A complete review of systems was performed.  All systems are negative except for pertinent findings as noted.  Physical Exam:  Vital signs in last 24 hours: Temp:  [97.6 F (36.4 C)-98.4 F (36.9 C)] 98.4 F (36.9 C) (02/09 1647) Pulse Rate:  [73-87] 74 (02/09 1647) Resp:  [18] 18 (02/09 1647) BP: (145-184)/(71-97) 145/75 (02/09 1647) SpO2:  [93 %-99 %] 98 % (02/09 1647) Constitutional:  Alert and oriented, No acute distress Elderly, frail. Cardiovascular: Regular rate and rhythm Respiratory: Normal respiratory effort, Lungs clear bilaterally GI: Abdomen is soft, non-distended, non-tender; no CVA tenderness Neurologic: Grossly intact, no focal deficits Psychiatric: Normal mood and affect  Laboratory Data:  Recent Labs    09/18/21 0116  WBC 7.6  HGB 11.6*  HCT 33.4*  PLT 226    Recent Labs    09/15/21 2331 09/18/21 0116 09/18/21 1640  NA 129* 123* 124*  K 4.3 3.4*  --   CL 96* 89*  --   GLUCOSE 195* 211*  --   BUN 9 10  --   CALCIUM 9.8 9.5  --  CREATININE 0.79 0.79  --      Results for orders placed or performed during the hospital encounter of 09/12/21 (from the past 24 hour(s))  Glucose, capillary     Status: Abnormal   Collection Time:  09/17/21 10:11 PM  Result Value Ref Range   Glucose-Capillary 185 (H) 70 - 99 mg/dL  CBC     Status: Abnormal   Collection Time: 09/18/21  1:16 AM  Result Value Ref Range   WBC 7.6 4.0 - 10.5 K/uL   RBC 3.88 3.87 - 5.11 MIL/uL   Hemoglobin 11.6 (L) 12.0 - 15.0 g/dL   HCT 33.4 (L) 36.0 - 46.0 %   MCV 86.1 80.0 - 100.0 fL   MCH 29.9 26.0 - 34.0 pg   MCHC 34.7 30.0 - 36.0 g/dL   RDW 13.2 11.5 - 15.5 %   Platelets 226 150 - 400 K/uL   nRBC 0.0 0.0 - 0.2 %  Basic metabolic panel     Status: Abnormal   Collection Time: 09/18/21  1:16 AM  Result Value Ref Range   Sodium 123 (L) 135 - 145 mmol/L   Potassium 3.4 (L) 3.5 - 5.1 mmol/L   Chloride 89 (L) 98 - 111 mmol/L   CO2 23 22 - 32 mmol/L   Glucose, Bld 211 (H) 70 - 99 mg/dL   BUN 10 8 - 23 mg/dL   Creatinine, Ser 0.79 0.44 - 1.00 mg/dL   Calcium 9.5 8.9 - 10.3 mg/dL   GFR, Estimated >60 >60 mL/min   Anion gap 11 5 - 15  Magnesium     Status: Abnormal   Collection Time: 09/18/21  1:16 AM  Result Value Ref Range   Magnesium 1.3 (L) 1.7 - 2.4 mg/dL  Glucose, capillary     Status: Abnormal   Collection Time: 09/18/21  8:08 AM  Result Value Ref Range   Glucose-Capillary 293 (H) 70 - 99 mg/dL  Osmolality     Status: Abnormal   Collection Time: 09/18/21  9:31 AM  Result Value Ref Range   Osmolality 267 (L) 275 - 295 mOsm/kg  TSH     Status: Abnormal   Collection Time: 09/18/21  9:31 AM  Result Value Ref Range   TSH >500.000 (H) 0.350 - 4.500 uIU/mL  Glucose, capillary     Status: Abnormal   Collection Time: 09/18/21 11:18 AM  Result Value Ref Range   Glucose-Capillary 206 (H) 70 - 99 mg/dL  Sodium, urine, random     Status: None   Collection Time: 09/18/21  1:42 PM  Result Value Ref Range   Sodium, Ur 134 mmol/L  Osmolality, urine     Status: None   Collection Time: 09/18/21  1:42 PM  Result Value Ref Range   Osmolality, Ur 441 300 - 900 mOsm/kg  Sodium     Status: Abnormal   Collection Time: 09/18/21  4:40 PM  Result  Value Ref Range   Sodium 124 (L) 135 - 145 mmol/L  Glucose, capillary     Status: None   Collection Time: 09/18/21  4:46 PM  Result Value Ref Range   Glucose-Capillary 83 70 - 99 mg/dL   Recent Results (from the past 240 hour(s))  Resp Panel by RT-PCR (Flu A&B, Covid) Nasopharyngeal Swab     Status: None   Collection Time: 09/12/21  7:24 PM   Specimen: Nasopharyngeal Swab; Nasopharyngeal(NP) swabs in vial transport medium  Result Value Ref Range Status   SARS Coronavirus 2 by RT PCR NEGATIVE  NEGATIVE Final    Comment: (NOTE) SARS-CoV-2 target nucleic acids are NOT DETECTED.  The SARS-CoV-2 RNA is generally detectable in upper respiratory specimens during the acute phase of infection. The lowest concentration of SARS-CoV-2 viral copies this assay can detect is 138 copies/mL. A negative result does not preclude SARS-Cov-2 infection and should not be used as the sole basis for treatment or other patient management decisions. A negative result may occur with  improper specimen collection/handling, submission of specimen other than nasopharyngeal swab, presence of viral mutation(s) within the areas targeted by this assay, and inadequate number of viral copies(<138 copies/mL). A negative result must be combined with clinical observations, patient history, and epidemiological information. The expected result is Negative.  Fact Sheet for Patients:  EntrepreneurPulse.com.au  Fact Sheet for Healthcare Providers:  IncredibleEmployment.be  This test is no t yet approved or cleared by the [place] FDA and  has been authorized for detection and/or diagnosis of SARS-CoV-2 by FDA under an Emergency Use Authorization (EUA). This EUA will remain  in effect (meaning this test can be used) for the duration of the COVID-19 declaration under Section 564(b)(1) of the Act, 21 U.S.C.section 360bbb-3(b)(1), unless the authorization is terminated  or revoked  sooner.       Influenza A by PCR NEGATIVE NEGATIVE Final   Influenza B by PCR NEGATIVE NEGATIVE Final    Comment: (NOTE) The Xpert Xpress SARS-CoV-2/FLU/RSV plus assay is intended as an aid in the diagnosis of influenza from Nasopharyngeal swab specimens and should not be used as a sole basis for treatment. Nasal washings and aspirates are unacceptable for Xpert Xpress SARS-CoV-2/FLU/RSV testing.  Fact Sheet for Patients: EntrepreneurPulse.com.au  Fact Sheet for Healthcare Providers: IncredibleEmployment.be  This test is not yet approved or cleared by the [place] FDA and has been authorized for detection and/or diagnosis of SARS-CoV-2 by FDA under an Emergency Use Authorization (EUA). This EUA will remain in effect (meaning this test can be used) for the duration of the COVID-19 declaration under Section 564(b)(1) of the Act, 21 U.S.C. section 360bbb-3(b)(1), unless the authorization is terminated or revoked.  Performed at [facility], [place] [address] [place], [place] [address]   C Difficile Quick Screen w PCR reflex     Status: None   Collection Time: 09/14/21  5:22 PM   Specimen: STOOL  Result Value Ref Range Status   C Diff antigen NEGATIVE NEGATIVE Final   C Diff toxin NEGATIVE NEGATIVE Final   C Diff interpretation No C. difficile detected.  Final    Comment: Performed at [facility], [place] [address], [place], [place] [address]  Urine Culture     Status: Abnormal   Collection Time: 09/17/21  8:17 AM   Specimen: Urine, Clean Catch  Result Value Ref Range Status   Specimen Description URINE, CLEAN CATCH  Final   Special Requests NONE  Final   Culture (A)  Final    <10,000 COLONIES/mL INSIGNIFICANT GROWTH Performed at [facility], [place] [address], [place], [place] [address]    Report Status 09/18/2021 FINAL  Final    Renal Function: Recent Labs    09/12/21 1632 09/13/21 0304 09/13/21 2050  09/14/21 0131 09/15/21 0452 09/15/21 2331 09/18/21 0116  CREATININE 1.26* 1.15* 0.91 0.89 0.79 0.79 0.79   Estimated Creatinine Clearance: 39.3 mL/min (by C-G formula based on SCr of 0.79 mg/dL).  Radiologic Imaging: MR ABDOMEN W WO CONTRAST  Result Date: 09/18/2021 CLINICAL DATA:  88 year old female with  history of indeterminate lesion noted in the right kidney on prior CT examinations. Follow-up study. EXAM: MRI ABDOMEN WITHOUT AND WITH CONTRAST TECHNIQUE: Multiplanar multisequence MR imaging of the abdomen was performed both before and after the administration of intravenous contrast. CONTRAST:  34mL GADAVIST GADOBUTROL 1 MMOL/ML IV SOLN COMPARISON:  No prior abdominal MRI. CT the abdomen and pelvis 09/12/2021. FINDINGS: Lower chest: Unremarkable. Hepatobiliary: No suspicious cystic or solid hepatic lesions. No intra or extrahepatic biliary ductal dilatation. Gallbladder is normal in appearance. Pancreas: No pancreatic mass. No pancreatic ductal dilatation. No pancreatic or peripancreatic fluid collections or inflammatory changes. Spleen:  Unremarkable. Adrenals/Urinary Tract: In the upper pole of the right kidney, centered predominantly in the region of the right renal collecting system (axial image 21 of series 5 and coronal image 9 of series 4) there is a 2.6 x 3.4 x 2.5 cm lesion which is isointense on T1 weighted images, slightly T2 hypointense, demonstrates low-level heterogeneous enhancement on post gadolinium imaging and diffuse diffusion restriction, with extension into the right renal pelvis (axial image 23 of series 5), highly suspicious for upper tract urothelial neoplasm. Left kidney and bilateral adrenal glands are normal in appearance. No hydroureteronephrosis in the visualized portions of the abdomen. Stomach/Bowel: Visualized portions are unremarkable. Vascular/Lymphatic: No aneurysm identified in the visualized abdominal vasculature. No lymphadenopathy noted in the abdomen. Other: No  significant volume of ascites noted in the visualized portions of the peritoneal cavity. Musculoskeletal: No aggressive appearing osseous lesions are noted in the visualized portions of the skeleton. IMPRESSION: 1. The lesion of concern in the upper pole of the right kidney has imaging characteristics highly concerning for upper tract urothelial neoplasm. Referral to Urology for further clinical evaluation is strongly recommended. 2. No definite lymphadenopathy or signs of metastatic disease in the abdomen. These results will be called to the ordering clinician or representative by the Radiologist Assistant, and communication documented in the PACS or [employer]. Electronically Signed   By: Vinnie Langton M.D.   On: 09/18/2021 08:11    I independently reviewed the above imaging studies.  Impression/Recommendation: #1.  Presumable right upper tract urothelial carcinoma: 2.6 x 3.4 x 2.5 lesion in the right upper tract seen on MRI 09/18/2021.  She was previously offered biopsy of this lesion by Dr. Milford Cage in 2022 however she declined.  No evidence of metastatic disease on recent MRI. #2Johney Maine hematuria: Likely secondary to above.  Hemoglobin stable around 11.6 on day of consultation.  She states she has been voiding darker urine without clots.  -I reviewed findings of MRI with patient again confirming suspected upper tract urothelial carcinoma.  I reminded her that this was identified on previous imaging and she was offered biopsy this lesion by Dr. Milford Cage.  She does recall that she declined this procedure however did state that she may ultimately agree with biopsy as an outpatient in the future.  We discussed that this is a quite a large lesion and definitive therapy would likely be a radical right nephro-ureterectomy.  She is unsure if she would like to proceed with this option. I affirm her hesitancy given her age and comorbidities. -No need for urgent procedure while she is hospitalized at this  point. -Her hematuria does seem mild.  Her hemoglobin has remained stable. -She states she has been voiding without significant difficulty.  -Unfortunately, her urine output is poorly recorded today.  I did asked the nurses to record bladder scans earlier today however unfortunately this was not done.  I did just discuss with nursing staff to record postvoid residuals overnight.  I told him to place a large bore Foley catheter if she is found to have elevated PVRs.  There is no hydroureteronephrosis identified on MRI to indicate obstruction. -I will message Dr. Milford Cage to notify him of Mrs. Durio admission. -Will arrange for outpatient followup  Matt R. Kay Shippy MD 09/18/2021, 8:41 PM  [facility]  Pager: [phone]   CC: Florencia Reasons, MD

## 2021-09-18 NOTE — Progress Notes (Addendum)
Contact pt Grandson with any updates or questions and plan of care for the pt. His name is Harrell Gave his number is in the chart. He is available any time or day. He will relay information regarding the pt to the family(daughter and husband)

## 2021-09-18 NOTE — Progress Notes (Signed)
PROGRESS NOTE    [name]  QBH:[id number] DOB: 1929/01/29 DOA: 09/12/2021 PCP: Binnie Rail, MD     Brief Narrative:  History of PAF refused anticoagulation in the past, insulin-dependent type 2 diabetes, hypertension, known right renal mass with intermittent hematuria, followed with urology in [place], recent diagnosis of compression fracture L2 in January 2023 presented with confusion found to have hyperglycemia, report glucometer malfunction, also have AKI   Subjective:  She is pleasant but very confused, not able to provide history    Assessment and Plan:  * Hyperglycemia due to diabetes mellitus ([place]) - nonadherence to home therapy d/t glucometer malfunction, assoc w/ ketosis- (present on admission) -Resolved -Blood glucose improved on current regimen, continue adjust insulin as needed  Acute metabolic encephalopathy -Per family, patient previously is independent and still drives to her medical appointment herself a few weeks ago Acute metabolic encephalopathy likely multifactorial, family report patient started have confusion after narcotic prescription a week prior to coming to the hospital -Per RN who had her on admission patient was confused initially but today she appear worse, Likely due to hyponatremia  Hyponatremia- (present on admission) Persistent and worsening hyponatremia despite  improving  blood glucose DC HCTZ Check urine sodium, urine osmole, serum osmole.  TSH and cortisol level Discussed with nephrology  Hypomagnesemia/hypokalemia DC HCTZ  replace IV mag and K, recheck in the morning   AKI (acute kidney injury) ([place]) -Resolved  Compression fracture of L2 ([place])- (present on admission) -Appear was evaluated by spine surgeon on 2/1 in the clinic -worsening based on imaging -pain control, outpatient follow-up  Right renal mass/hematuria Per PCP last note, following w/ urology outpatient  Family is not aware which urology she goes to, as  patient is to go to appointment herself consult urology here   History of Atrial fibrillation ([place]) - currently sinus rhythm- (present on admission) -sinus on EKG in ED, no palpitations  -Per cardiology note, she refused anticoagulation in the past    Hypothyroidism- (present on admission) -continue home dose synthroid, added TSH d/t previous levels being abn  Hypertension- (present on admission) -Hold lisinopril in the setting of hyponatremia -Hold HCTZ in setting of hyponatremia -Resume beta-blocker       Principal Problem:   Hyperglycemia due to diabetes mellitus ([place]) - nonadherence to home therapy d/t glucometer malfunction, assoc w/ ketosis Active Problems:   Hypertension   Hypothyroidism   Hyponatremia   History of Atrial fibrillation ([place]) - currently sinus rhythm   Right renal mass   Compression fracture of L2 ([facility])   AKI (acute kidney injury) ([place])   Hypokalemia   Hypomagnesemia      Nutritional Assessment: The patients BMI is: Body mass index is 18.37 kg/m.[name] Seen by dietician.  I agree with the assessment and plan as outlined below: Nutrition Status:          I ordered the following labs:  Unresulted Labs (From admission, onward)     Start     Ordered   09/19/21 0500  Cortisol  Tomorrow morning,   R       Question:  Specimen collection method  Answer:  Lab=Lab collect   09/18/21 0812   09/17/21 1818  Urinalysis, Routine w reflex microscopic Urine, Clean Catch  Once,   R        09/17/21 1817   09/13/21 0500  Urinalysis, Routine w reflex microscopic  Tomorrow morning,   R        09/12/21 2247  DVT prophylaxis: Place and maintain sequential compression device Start: 09/17/21 1608   Code Status:   Code Status: Full Code  Family Communication: Daughter and husband over the phone Disposition:   Status is: Inpatient  Dispo: The patient is from: Home              Anticipated d/c is to: SNF              Anticipated d/c  date is: To be determined     Objective: Vitals:   09/17/21 2212 09/18/21 0037 09/18/21 0526 09/18/21 0940  BP: (!) 184/84 (!) 167/80 (!) 168/97 (!) 159/71  Pulse: 87 73 73 77  Resp: 18  18 18   Temp: 97.6 F (36.4 C)  98.4 F (36.9 C) 97.7 F (36.5 C)  TempSrc: Oral  Oral Oral  SpO2: 93%  99% 94%  Weight:      Height:        Intake/Output Summary (Last 24 hours) at 09/18/2021 1558 Last data filed at 09/18/2021 1001 Gross per 24 hour  Intake 240 ml  Output --  Net 240 ml   Filed Weights   09/12/21 1614 09/16/21 0800 09/17/21 0456  Weight: 53 kg 53 kg 53.2 kg    Examination:  General exam: alert, calm but very confused , only oriented to herself  Respiratory system: Clear to auscultation. Respiratory effort normal. Cardiovascular system:  RRR.  Gastrointestinal system: Abdomen is nondistended, soft and nontender.  Normal bowel sounds heard. Central nervous system: Alert and oriented to self only Extremities:  no edema Skin: No rashes, lesions or ulcers Psychiatry: Calm but confused   Data Reviewed: I have personally reviewed  labs and visualized  imaging studies since the last encounter and formulate the plan        Scheduled Meds:  insulin aspart  0-5 Units Subcutaneous QHS   insulin aspart  0-9 Units Subcutaneous TID WC   insulin aspart  3 Units Subcutaneous TID WC   insulin glargine-yfgn  7 Units Subcutaneous QHS   levothyroxine  112 mcg Oral Q0600   losartan  50 mg Oral Daily   melatonin  3 mg Oral QHS   potassium chloride  40 mEq Oral BID   simvastatin  20 mg Oral QHS   Continuous Infusions:   LOS: 5 days    Time spent on coordination of care, case discussed with transitional care team, , RN and consultant nephrology, patient and family    Florencia Reasons, MD PhD FACP [facility]  Available via Epic secure chat 7am-7pm for nonurgent issues Please page for urgent issues To page the attending provider between 7A-7P or the covering provider  during after hours 7P-7A, please log into the web site [email or web address] and access using universal [place] password for that web site. If you do not have the password, please call the hospital operator.    09/18/2021, 3:58 PM

## 2021-09-18 NOTE — Assessment & Plan Note (Addendum)
Severe with metabolic encephalopathy caused by dehydration from HCTZ, nephrology following, clinically appeared extremely hydrated, received IV fluids on 09/22/2021 with improvement in sodium levels, case discussed with nephrology on 09/23/2021, added salt tablets, continue to monitor. Her encephalopathy seems to have improved and now close to baseline.  Remove Foley catheter on 09/23/2021.

## 2021-09-19 DIAGNOSIS — L8990 Pressure ulcer of unspecified site, unspecified stage: Secondary | ICD-10-CM | POA: Insufficient documentation

## 2021-09-19 DIAGNOSIS — R4182 Altered mental status, unspecified: Secondary | ICD-10-CM | POA: Diagnosis not present

## 2021-09-19 DIAGNOSIS — E1165 Type 2 diabetes mellitus with hyperglycemia: Secondary | ICD-10-CM | POA: Diagnosis not present

## 2021-09-19 LAB — BASIC METABOLIC PANEL
Anion gap: 12 (ref 5–15)
Anion gap: 9 (ref 5–15)
BUN: 14 mg/dL (ref 8–23)
BUN: 17 mg/dL (ref 8–23)
CO2: 20 mmol/L — ABNORMAL LOW (ref 22–32)
CO2: 21 mmol/L — ABNORMAL LOW (ref 22–32)
Calcium: 9.1 mg/dL (ref 8.9–10.3)
Calcium: 9.2 mg/dL (ref 8.9–10.3)
Chloride: 87 mmol/L — ABNORMAL LOW (ref 98–111)
Chloride: 94 mmol/L — ABNORMAL LOW (ref 98–111)
Creatinine, Ser: 0.82 mg/dL (ref 0.44–1.00)
Creatinine, Ser: 0.84 mg/dL (ref 0.44–1.00)
GFR, Estimated: >60 mL/min (ref >60)
GFR, Estimated: >60 mL/min (ref >60)
Glucose, Bld: 257 mg/dL — ABNORMAL HIGH (ref 70–99)
Glucose, Bld: 136 mg/dL — ABNORMAL HIGH (ref 70–99)
Potassium: 4.2 mmol/L (ref 3.5–5.1)
Potassium: 3.6 mmol/L (ref 3.5–5.1)
Sodium: 119 mmol/L — CL (ref 135–145)
Sodium: 124 mmol/L — ABNORMAL LOW (ref 135–145)

## 2021-09-19 LAB — GLUCOSE, CAPILLARY
Glucose-Capillary: 115 mg/dL — ABNORMAL HIGH (ref 70–99)
Glucose-Capillary: 147 mg/dL — ABNORMAL HIGH (ref 70–99)
Glucose-Capillary: 158 mg/dL — ABNORMAL HIGH (ref 70–99)
Glucose-Capillary: 338 mg/dL — ABNORMAL HIGH (ref 70–99)
Glucose-Capillary: 46 mg/dL — ABNORMAL LOW (ref 70–99)
Glucose-Capillary: 89 mg/dL (ref 70–99)

## 2021-09-19 LAB — SODIUM
Sodium: 121 mmol/L — ABNORMAL LOW (ref 135–145)
Sodium: 126 mmol/L — ABNORMAL LOW (ref 135–145)

## 2021-09-19 LAB — MAGNESIUM: Magnesium: 1.6 mg/dL — ABNORMAL LOW (ref 1.7–2.4)

## 2021-09-19 LAB — CORTISOL: Cortisol, Plasma: 20.6 ug/dL

## 2021-09-19 LAB — URIC ACID: Uric Acid, Serum: 2.8 mg/dL (ref 2.5–7.1)

## 2021-09-19 LAB — T4, FREE: Free T4: 0.92 ng/dL (ref 0.61–1.12)

## 2021-09-19 MED ORDER — INSULIN ASPART 100 UNIT/ML IJ SOLN
0.0000 [IU] | INTRAMUSCULAR | Status: DC
  Administered 2021-09-19: 1 [IU] via SUBCUTANEOUS
  Administered 2021-09-20: 2 [IU] via SUBCUTANEOUS

## 2021-09-19 MED ORDER — SODIUM CHLORIDE 0.9 % IV SOLN: INTRAVENOUS | Status: DC

## 2021-09-19 MED ORDER — SODIUM CHLORIDE 0.9 % IV BOLUS: 500.0000 mL | Freq: Once | INTRAVENOUS | Status: DC

## 2021-09-19 MED ORDER — SODIUM CHLORIDE 3 % IV SOLN
INTRAVENOUS | Status: DC
  Filled 2021-09-19: qty 500

## 2021-09-19 MED ORDER — ACETAMINOPHEN 325 MG PO TABS
650.0000 mg | ORAL_TABLET | Freq: Four times a day (QID) | ORAL | Status: DC | PRN
  Administered 2021-09-21: 650 mg via ORAL
  Filled 2021-09-19: qty 2

## 2021-09-19 MED ORDER — HYDRALAZINE HCL 20 MG/ML IJ SOLN
10.0000 mg | Freq: Four times a day (QID) | INTRAMUSCULAR | Status: DC | PRN
  Administered 2021-09-19 – 2021-09-21 (×3): 10 mg via INTRAVENOUS
  Filled 2021-09-19 (×3): qty 1

## 2021-09-19 NOTE — Consult Note (Signed)
NAME:  [name], MRN:  [id number], DOB:  June 01, 1929, LOS: 6 ADMISSION DATE:  09/12/2021, CONSULTATION DATE:  09/19/2021 REFERRING MD: Florencia Reasons, MD CHIEF COMPLAINT:  Altered Mental Status   History of Present Illness:  [name] is a 88 year old female living with type HLD, HTN, recent compression fracture of L2 vertebrae in January, presumable right upper tract urothelial carcinoma, type 1 diabetes mellitus and hypothyroidism who presented to ED with confusion and hyperglycemia.  This is hospital day #7 of her admission and we were asked to consult for worsening hyponatremia.  On exam patient is confused and family is bedside. Daughter states that patient's confusion began 2 days prior to admission after being prescribed Tramadol by per PCP. During this hospitalization, approximately 2 days ago, patient's mental status seemed to further worsen. No other new symptoms, including SOB, chest pain.  Per chart review, patient presented to the ED with c/o hyperglycemia and confusion. Patient typically takes sliding scale NovoLog and 14 units Lantus nightly, has not been taking her insulin over the past 4-5 days since her home glucometer was not functioning and she was not able to obtain a new one.  Blood pressure was elevated up to 162/110 on initial ED evaluation, patient was also complaining of some left lower quadrant abdominal pain though exam was normal.  Labs demonstrated pseudohyponatremia, corrected sodium is within normal range, AKI with creatinine 1.26, her baseline over the past year or creatinine about 1.74-0.83, glucose to 469, glucose on UA greater than 500, ketones on urine greater than 80, UA also showed epithelial cells, no WBC.  EKG was normal sinus rhythm.  Patient was given bolus normal saline, NovoLog 12 units subq.  Pertinent  Medical History   Past Medical History:  Diagnosis Date   Anxiety    Arthritis    fingers   Bowel obstruction ([place])    Cancer of upper-outer quadrant  of female breast ([place]) 08/21/2011   ER +  PR +  Her 2 -  Ki67 9%   0.9 cm invasive lobular  Carcinoma ,s/p central lumpectomy with sentinel node biopsy,  ER/PR positive s/p re-excion on 09/16/11 with final pathology showing atypical hyperplasia    CAP (community acquired pneumonia) 12/06/2016   Diabetes mellitus type 1 ([place])    type I- wears insulin pump   GERD (gastroesophageal reflux disease)    History of small bowel obstruction    Hypercholesteremia    Hypergastrinemia    Hypertension    Hypertension    Hyponatremia    Hypothyroid    Hypothyroidism    IBS (irritable bowel syndrome)    Osteoarthritis    Osteoporosis, post-menopausal    DEXA 12/02/11: -3.5 (with lomax gyn), declines bisphos due to bone pain   PAF (paroxysmal atrial fibrillation) ([place])    One episode in 2008, spontaneously converted in the ED, no further treatment   Spondylosis    thoracic spine   Thyroid nodule dx 07/2011   [place] q 5mo to follow calcified L thyroid nodule (3/61/44 [place])   Umbilical hernia    Significant Hospital Events: Including procedures, antibiotic start and stop dates in addition to other pertinent events   2/03: Admitted to [facility] by [facility] 2/10: PCCM and Nephrology consulted for worsening hyponatremia and confusion  Interim History / Subjective:  As above  Objective   Blood pressure 132/74, pulse 70, temperature 97.6 F (36.4 C), temperature source Oral, resp. rate 19, height $RemoveBe'5\' 7"'TItifqPdR$  (1.702 m), weight 53.2 kg, SpO2 99 %.  Intake/Output Summary (Last 24 hours) at 09/19/2021 1542 Last data filed at 09/19/2021 1400 Gross per 24 hour  Intake 637.41 ml  Output 725 ml  Net -87.59 ml   Filed Weights   09/12/21 1614 09/16/21 0800 09/17/21 0456  Weight: 53 kg 53 kg 53.2 kg   Examination: General: Pleasantly confused, elderly female. No acute distress HENT: PERRL. Dry mucus membranes.  Lungs: Clear to auscultation in all lung fields. No increased work of breathing. No tachypnea.   Cardiovascular: Regular rate and rhythm. No murmurs.  Abdomen: Soft, non-tender. Active BS Extremities: No pitting edema. No gross deformities.  Neuro: Alert but not oriented.  Psych: Visual hallucinations. No agitation.  GU: Deferred   Resolved Hospital Problem list     Assessment & Plan:   # Acute on Chronic Hyponatremia , moderate History of hyponatremia and noted under problem list to be in setting of HCTZ in 2001.  Patient is coming on HCTZ and continued on admission. Last dose 2/8.  Initial sodiums corrected > 130 , appears serum sodium was staying stable with normal IV saline administration. Once fluids were stopped patient had acute worsening. HCTZ on hold now. I am not convinced patient mental status change is all from hyponatremia, but will need correct to rule this out.  - Continue gentle hydration with 75cc/hr of NS - Monitor q4 BMP, goal max sodium correction 8 meq over next 24 hours  # Uncontrolled Hypothyroidism  Patients TSH 9.2 04/28/2021, increased dose to 125 mcg. TSH markedly elevated here in setting of acute illness.Patient on 112 mcg on admission.  - Increase to 125 mcg - Follow up repeat TSH, and free T4  # Acute Metabolic Encephalopathy  Initially suspected to be secondary to hyperglycemia versus adverse drug reaction to opioid. However mental status worsened over last 2 days, suspected to be secondary to hyponatremia. Review of daily notes show patient had wax and waining mental status. Some days noted to be to benzodiazapine, then normal on 8th.  Currently protecting her airway - CT Head to rule out structural causes - Management of hyponatremia as noted above.   # Type I Diabetes Mellitus w/ Hyperglycemia  A1c of 6.9% on admission.  - Start SSI q4h (sensitive)  - Continue Semglee 7 units at bedtime   # Hypertension  - Blood pressure well controlled at this time  - Continue home Metoprolol - Start PRN Hydralazine  - Holding home Lisinopril and HCTZ  #  L2 Compression Fracture # Osteoporosis  - Start Tylenol 650 mg PRN   # Right Renal Mass w/ Hematuria  - Follows with Urology - Continue foley per Urology   Best Practice (right click and "Reselect all SmartList Selections" daily)   Diet/type: Regular consistency (see orders) DVT prophylaxis: SCD GI prophylaxis: N/A Lines: N/A Foley:  N/A Code Status:  full code Last date of multidisciplinary goals of care discussion [Family updated at bedside on 2/10]  Labs   CBC: Recent Labs  Lab 09/12/21 1632 09/15/21 0452 09/18/21 0116  WBC 7.1 6.9 7.6  NEUTROABS 6.0  --   --   HGB 12.9 12.6 11.6*  HCT 38.2 36.3 33.4*  MCV 91.0 87.7 86.1  PLT 270 226 528    Basic Metabolic Panel: Recent Labs  Lab 09/13/21 1611 09/13/21 2050 09/14/21 0131 09/15/21 0452 09/15/21 2331 09/18/21 0116 09/18/21 1640 09/18/21 2038 09/19/21 0049 09/19/21 0403  NA  --    < > 131* 127* 129* 123* 124* 121* 121* 119*  K  --    < >  3.1* 3.5 4.3 3.4*  --   --   --  4.2  CL  --    < > 100 92* 96* 89*  --   --   --  87*  CO2  --    < > $R'23 23 22 23  'Ad$ --   --   --  20*  GLUCOSE  --    < > 154* 238* 195* 211*  --   --   --  257*  BUN  --    < > $R'15 10 9 10  'tU$ --   --   --  14  CREATININE  --    < > 0.89 0.79 0.79 0.79  --   --   --  0.82  CALCIUM  --    < > 9.4 9.5 9.8 9.5  --   --   --  9.1  MG 1.4*  --  2.1  --   --  1.3*  --   --   --  1.6*   < > = values in this interval not displayed.   GFR: Estimated Creatinine Clearance: 38.3 mL/min (by C-G formula based on SCr of 0.82 mg/dL). Recent Labs  Lab 09/12/21 1632 09/15/21 0452 09/18/21 0116  WBC 7.1 6.9 7.6   Liver Function Tests: Recent Labs  Lab 09/12/21 1632  AST 30  ALT 16  ALKPHOS 89  BILITOT 1.9*  PROT 7.5  ALBUMIN 3.9   No results for input(s): LIPASE, AMYLASE in the last 168 hours. No results for input(s): AMMONIA in the last 168 hours.  ABG    Component Value Date/Time   HCO3 26.3 (H) 02/23/2007 0509   TCO2 27 02/23/2007 0509     Coagulation Profile: No results for input(s): INR, PROTIME in the last 168 hours.  Cardiac Enzymes: No results for input(s): CKTOTAL, CKMB, CKMBINDEX, TROPONINI in the last 168 hours.  HbA1C: Hgb A1c MFr Bld  Date/Time Value Ref Range Status  09/13/2021 04:11 PM 6.9 (H) 4.8 - 5.6 % Final    Comment:    (NOTE) Pre diabetes:          5.7%-6.4%  Diabetes:              >6.4%  Glycemic control for   <7.0% adults with diabetes   09/13/2021 01:00 AM 6.9 (H) 4.8 - 5.6 % Final    Comment:    (NOTE) Pre diabetes:          5.7%-6.4%  Diabetes:              >6.4%  Glycemic control for   <7.0% adults with diabetes    CBG: Recent Labs  Lab 09/18/21 1118 09/18/21 1646 09/18/21 2123 09/19/21 0738 09/19/21 1142  GLUCAP 206* 83 114* 338* 158*   Review of Systems:   Negative except as noted above.   Past Medical History:  She,  has a past medical history of Anxiety, Arthritis, Bowel obstruction ([place]), Cancer of upper-outer quadrant of female breast ([place]) (08/21/2011), CAP (community acquired pneumonia) (12/06/2016), Diabetes mellitus type 1 ([place]), GERD (gastroesophageal reflux disease), History of small bowel obstruction, Hypercholesteremia, Hypergastrinemia, Hypertension, Hypertension, Hyponatremia, Hypothyroid, Hypothyroidism, IBS (irritable bowel syndrome), Osteoarthritis, Osteoporosis, post-menopausal, PAF (paroxysmal atrial fibrillation) ([place]), Spondylosis, Thyroid nodule (dx 73/5329), and Umbilical hernia.   Surgical History:   Past Surgical History:  Procedure Laterality Date   ABDOMINAL HYSTERECTOMY     ABDOMINAL HYSTERECTOMY  yrs ago   APPENDECTOMY     APPENDECTOMY  age 80  BLADDER REPAIR  5 yrs ago   BREAST LUMPECTOMY     BREAST SURGERY     biopsy   BREAST SURGERY   yrs ago   br bx   BREAST SURGERY  01/ 23/2013   left central lumpectomy and snbx, re-excision lumpectomy   CARDIOVASCULAR STRESS TEST  03/17/2012   colon surgery     Small intestines- took 6  inches out   COLONOSCOPY  07/11/2015   [facility] Dr Derrill Kay   ESOPHAGOGASTRODUODENOSCOPY  07/17/2010   [facility]   LEFT HEART CATHETERIZATION WITH CORONARY ANGIOGRAM N/A 03/18/2012   Procedure: LEFT HEART CATHETERIZATION WITH CORONARY ANGIOGRAM;  Surgeon: Minus Breeding, MD;  Location: [facility] CATH LAB;  Service: Cardiovascular;  Laterality: N/A;   TONSILLECTOMY   age 25    Social History:   reports that she quit smoking about 44 years ago. Her smoking use included cigarettes. She has a 10.00 pack-year smoking history. She has never used smokeless tobacco. She reports current alcohol use. She reports that she does not use drugs.   Family History:  Her family history includes Arthritis in her father; Cancer in her sister; Diabetes in her son; Heart disease in her father; Hypertension in her mother; Stroke in her mother. There is no history of Colon cancer or Esophageal cancer.   Allergies Allergies  Allergen Reactions   Eliquis [Apixaban] Other (See Comments)    Dizziness, severe headach   Amlodipine     Dizzy, nausea   Augmentin [Amoxicillin-Pot Clavulanate] Other (See Comments)    Gi upset only no rash or hives    Chlorhexidine    Cortisone Other (See Comments)    Makes blood sugars run high   Keflex [Cephalexin] Hives and Nausea Only    Stomach upset   Omeprazole Nausea Only    Dizziness and nausea   Prednisone     Other reaction(s): Other Makes blood sugars run high   Hydrocodone Rash    Rash to chest , side back   Latex Rash    Powder in gloves causes rash; "not allergic to latex just the powder"   Home Medications  Prior to Admission medications   Medication Sig Start Date End Date Taking? Authorizing Provider  ALPRAZolam (XANAX) 0.5 MG tablet TAKE 1/2 TABLET TWICE DAILY AS NEEDED FOR ANXIETY OR SLEEP Patient taking differently: Take 0.25 mg by mouth 2 (two) times daily as needed for anxiety or sleep. 06/30/21   Binnie Rail, MD  aspirin EC 81 MG tablet Take 81 mg by  mouth daily.    [provider]  B Complex-C (SUPER B COMPLEX PO) Take 1 tablet by mouth daily.    [provider]  BD VEO INSULIN SYRINGE U/F 31G X 15/64" 0.3 ML MISC USE WITH INJECTIONS 07/28/21   Burns, Claudina Lick, MD  bisacodyl (DULCOLAX) 10 MG suppository Place 1 suppository (10 mg total) rectally as needed for moderate constipation. 04/27/19   Binnie Rail, MD  carboxymethylcellulose (REFRESH PLUS) 0.5 % SOLN Place 1 drop into both eyes 3 (three) times daily as needed (dry eyes). Non-preservative    [provider]  cholecalciferol (VITAMIN D) 1000 UNITS tablet Take 5,000 Units by mouth daily.     [provider]  DUREZOL 0.05 % EMUL Place 1 drop into the right eye 3 (three) times daily. 04/01/21   [provider]  fish oil-omega-3 fatty acids 1000 MG capsule Take 2 g by mouth daily.    [provider]  hydrochlorothiazide (  HYDRODIURIL) 25 MG tablet TAKE (1) TABLET DAILY AS NEEDED. Patient taking differently: Take 25 mg by mouth daily as needed. 07/30/21   Binnie Rail, MD  insulin glargine (LANTUS) 100 UNIT/ML injection Inject 8 Units into the skin at bedtime.    [provider]  insulin lispro (HUMALOG) 100 UNIT/ML KiwkPen Inject 0-12 Units into the skin with breakfast, with lunch, and with evening meal. Per sliding scale, max of 35 units per day    [provider]  levothyroxine (SYNTHROID) 112 MCG tablet Take 112 mcg by mouth daily. 03/22/20   [provider]  losartan (COZAAR) 100 MG tablet TAKE 1 TABLET ONCE DAILY. Patient taking differently: Take 100 mg by mouth daily. 03/20/21   Binnie Rail, MD  methocarbamol (ROBAXIN) 500 MG tablet Take 1 tablet (500 mg total) by mouth at bedtime as needed for muscle spasms. 04/22/21   Binnie Rail, MD  metoprolol succinate (TOPROL-XL) 25 MG 24 hr tablet TAKE 1/2 TABLET AS NEEDED FOR PALPITATIONS. Patient taking differently: Take 12.5 mg by mouth daily as needed (heart  palpitations). 02/21/21   Evans Lance, MD  Multiple Vitamin (MULTIVITAMIN) tablet Take 1 tablet by mouth daily. For breast and bone health    [provider]  ondansetron (ZOFRAN) 4 MG tablet Take 1 tablet (4 mg total) by mouth every 8 (eight) hours as needed for nausea or vomiting. 06/30/21   Binnie Rail, MD  potassium chloride SA (KLOR-CON M) 20 MEQ tablet Take 1 tablet (20 mEq total) by mouth 2 (two) times daily. 08/14/21   Horton, Barbette Hair, MD  saccharomyces boulardii (FLORASTOR) 250 MG capsule Take 1 capsule (250 mg total) by mouth 2 (two) times daily. 04/04/19   Binnie Rail, MD  simvastatin (ZOCOR) 20 MG tablet TAKE ONE TABLET AT BEDTIME. Patient taking differently: Take 20 mg by mouth at bedtime. 11/27/20   Binnie Rail, MD  traMADol (ULTRAM) 50 MG tablet Take 1 tablet (50 mg total) by mouth every 8 (eight) hours as needed for severe pain (lumbar compression fractures). 08/19/21   Binnie Rail, MD   Dr. Jose Persia Internal Medicine PGY-3  09/19/2021, 3:42 PM

## 2021-09-19 NOTE — Care Management Important Message (Signed)
Important Message  Patient Details  Name: [name] MRN: [id number] Date of Birth: 1928/09/03   Medicare Important Message Given:  Yes     Shelda Altes 09/19/2021, 3:07 PM

## 2021-09-19 NOTE — TOC Progression Note (Signed)
Transition of Care [facility]) - Initial/Assessment Note    Patient Details  Name: [name] MRN: [id number] Date of Birth: 05/11/1929  Transition of Care [facility]) CM/SW Contact:    [name], [place] Phone Number: 09/19/2021, 3:38 PM  Clinical Narrative:                 CSW contacted MD to inquire about possible discharge date and was informed that the patient is currently not medically ready and will not be ready for "a few more days".  CSW informed the facility who reported that they cannot hold the SNF bed, but will check to see if they have one available when the patient is medically ready.      Insurance [employer]-  Plan ID number is [id number], Candace Cruise id # [id number] ends 2/12  Expected Discharge Plan: Skilled Nursing Facility Barriers to Discharge: Insurance Authorization, SNF Pending bed offer   Patient Goals and CMS Choice Patient states their goals for this hospitalization and ongoing recovery are:: To get home CMS Medicare.gov Compare Post Acute Care list provided to:: Patient Represenative (must comment) Choice offered to / list presented to : Spouse  Expected Discharge Plan and Services Expected Discharge Plan: [place]       Living arrangements for the past 2 months: Single Family Home                                      Prior Living Arrangements/Services Living arrangements for the past 2 months: Single Family Home Lives with:: Self, Spouse Patient language and need for interpreter reviewed:: Yes Do you feel safe going back to the place where you live?: Yes      Need for Family Participation in Patient Care: Yes (Comment) Care giver support system in place?: Yes (comment)   Criminal Activity/Legal Involvement Pertinent to Current Situation/Hospitalization: No - Comment as needed  Activities of Daily Living      Permission Sought/Granted   Permission granted to share information with : Yes, Release of Information Signed      Permission granted to share info w AGENCY: SNFs  Permission granted to share info w Relationship: Draughon,Paul C (Spouse)     Emotional Assessment Appearance:: Appears stated age Attitude/Demeanor/Rapport: Gracious Affect (typically observed): Calm Orientation: : Oriented to Self, Oriented to Place Alcohol / Substance Use: Not Applicable Psych Involvement: No (comment)  Admission diagnosis:  Diabetic ketoacidosis without coma associated with type 1 diabetes mellitus ([place]) [E10.10] Hyperglycemia due to diabetes mellitus ([place]) [E11.65] Patient Active Problem List   Diagnosis Date Noted   Hypokalemia 09/18/2021   Hypomagnesemia 09/18/2021   Hyperglycemia due to diabetes mellitus ([place]) - nonadherence to home therapy d/t glucometer malfunction, assoc w/ ketosis 09/12/2021   AKI (acute kidney injury) ([place]) 09/12/2021   History of small bowel obstruction 08/19/2021   Menopause present 54/00/8676   Umbilical hernia 19/50/9326   Atrophy of vagina 08/19/2021   Compression fracture of L1 lumbar vertebra ([place]) 08/18/2021   Compression fracture of L2 ([place]) 08/18/2021   Black stools 08/13/2021   Right renal mass 06/30/2021   Lower back pain 06/30/2021   Acute cystitis 05/05/2021   Anxiety 12/31/2020   Pain in left foot 03/11/2020   Left leg swelling 03/05/2020   B12 deficiency 12/15/2019   Tingling in extremities 10/04/2019   Compression fracture of thoracic vertebra ([place]) 08/21/2019   Osteoporosis 08/21/2019   Chronic  thoracic back pain 08/16/2019   History of Atrial fibrillation ([place]) - currently sinus rhythm 02/01/2018   Leg cramps 07/05/2017   Constipation 01/25/2017   Closed fracture of proximal end of right humerus 07/09/2016   Cough 06/17/2016   Abdominal pain 03/10/2016   Hearing loss 01/03/2016   Allergic rhinitis 01/03/2016   Minimal Coronary Plaque by Cardiac Cath in 2013 09/11/2015   Frequent loose stools 08/28/2015   Altered bowel habits 09/26/2014   Hyponatremia     Acute mesenteric ischemia ([place])    Hypothyroidism 07/11/2012   Syncope 03/18/2012   Hypercholesteremia    Hypertension    Diabetes mellitus type 1 ([place])    Osteoarthritis    Thyroid nodule    Osteoporosis, post-menopausal    Malignant neoplasm of breast ([place]) 11/10/2011   Primary cancer of upper outer quadrant of left female breast ([place]) 08/21/2011   PCP:  Binnie Rail, MD Pharmacy:   [place], [place] [address] Phone: [phone] Fax: [phone]  [id number] - [place], [address] AT [place] [address] [place] [address] Phone: [phone] Fax: [phone]     Social Determinants of Health (SDOH) Interventions    Readmission Risk Interventions No flowsheet data found.

## 2021-09-19 NOTE — Progress Notes (Signed)
PROGRESS NOTE    [name]  ZOX:[id number] DOB: 04/10/1929 DOA: 09/12/2021 PCP: Binnie Rail, MD     Brief Narrative:  History of PAF refused anticoagulation in the past, insulin-dependent type 2 diabetes, hypertension, known right renal mass with intermittent hematuria, followed with urology in [place], recent diagnosis of compression fracture L2 in January 2023 presented with confusion found to have hyperglycemia, report glucometer malfunction, also have AKI   Subjective:  She is pleasant but very confused, not able to provide history Sodium continue to drop Gross hematuria  Family at bedside     Assessment and Plan:  Principal Problem:   Hyperglycemia due to diabetes mellitus ([place]) - nonadherence to home therapy d/t glucometer malfunction, assoc w/ ketosis Active Problems:   Hypertension   Hypothyroidism   Hyponatremia   History of Atrial fibrillation (H[facility]) - currently sinus rhythm   Right renal mass   Compression fracture of L2 ([facility])   AKI (acute kidney injury) ([place])   Hypokalemia   Hypomagnesemia   * Hyperglycemia due to diabetes mellitus ([place]) - nonadherence to home therapy d/t glucometer malfunction, assoc w/ ketosis- (present on admission) -Resolved -Blood glucose improved on current regimen, continue adjust insulin as needed  Acute metabolic encephalopathy -Per family, patient previously is independent and still drives to her medical appointment herself a few weeks ago Acute metabolic encephalopathy likely multifactorial, family report patient started have confusion after narcotic prescription a week prior to coming to the hospital -Per RN who had her on admission patient was confused initially but today she appear worse, Likely due to hyponatremia  Hyponatremia- (present on admission) Persistent and worsening hyponatremia despite  improving  blood glucose DC HCTZ  urine sodium, urine osmole, serum osmole suggest SIADH but she has been on HCTZ , so  could be affecting result TSH and cortisol level Discussed with nephrology who recommend transfer to icu for hypertonic saline Case discussed with ICU, patient is accepted to transfer to ICU  Hypomagnesemia/hypokalemia DC HCTZ  replace IV mag and K, recheck in the morning   AKI (acute kidney injury) ([place]) -Resolved  Compression fracture of L2 ([place])- (present on admission) -Appear was evaluated by spine surgeon on 2/1 in the clinic -worsening based on imaging -pain control, outpatient follow-up  Right renal mass/hematuria Per PCP last note, following w/ urology outpatient  Family is not aware which urology she goes to, as patient is to go to appointment herself consult urology here   History of Atrial fibrillation ([place]) - currently sinus rhythm- (present on admission) -sinus on EKG in ED, no palpitations  -Per cardiology note, she refused anticoagulation in the past    Hypothyroidism- (present on admission) -continue home dose synthroid -repeat tsh  Hypertension- (present on admission) -Hold lisinopril in the setting of hyponatremia -Hold HCTZ in setting of hyponatremia -Resume beta-blocker     : Body mass index is 18.37 kg/m..           I ordered the following labs:  Unresulted Labs (From admission, onward)     Start     Ordered   09/20/21 0500  Comprehensive metabolic panel  Tomorrow morning,   R       Question:  Specimen collection method  Answer:  Lab=Lab collect   09/19/21 1624   09/20/21 0500  CBC with Differential/Platelet  Tomorrow morning,   R       Question:  Specimen collection method  Answer:  Lab=Lab collect   09/19/21 1624   09/19/21 1915  Sodium  Now then every 4 hours,   R (with TIMED occurrences)     Question:  Specimen collection method  Answer:  Lab=Lab collect   09/19/21 1622   09/19/21 1609  T4, free  ONCE - STAT,   STAT       Question:  Specimen collection method  Answer:  Lab=Lab collect   09/19/21 1608              DVT  prophylaxis: Place and maintain sequential compression device Start: 09/17/21 1608   Code Status:   Code Status: Full Code  Family Communication: Daughter and husband at bedside  Disposition:   Status is: Inpatient  Dispo: The patient is from: Home              Anticipated d/c is to: SNF              Anticipated d/c date is: To be determined     Objective: Vitals:   09/18/21 2122 09/19/21 0539 09/19/21 0934 09/19/21 1600  BP: (!) 179/81 (!) 169/93 132/74   Pulse: 65 73 70   Resp: 18 19 19    Temp: 98.5 F (36.9 C) 98 F (36.7 C) 97.6 F (36.4 C) 97.7 F (36.5 C)  TempSrc:  Oral Oral Oral  SpO2: 95% 96% 99%   Weight:      Height:        Intake/Output Summary (Last 24 hours) at 09/19/2021 1706 Last data filed at 09/19/2021 1400 Gross per 24 hour  Intake 637.41 ml  Output 725 ml  Net -87.59 ml   Filed Weights   09/12/21 1614 09/16/21 0800 09/17/21 0456  Weight: 53 kg 53 kg 53.2 kg    Examination:  General exam: alert, calm but very confused , only oriented to herself  Respiratory system: Clear to auscultation. Respiratory effort normal. Cardiovascular system:  RRR.  Gastrointestinal system: Abdomen is nondistended, soft and nontender.  Normal bowel sounds heard. Central nervous system: Alert and oriented to self only Extremities:  no edema Skin: No rashes, lesions or ulcers Psychiatry: Calm but confused   Data Reviewed: I have personally reviewed  labs and visualized  imaging studies since the last encounter and formulate the plan        Scheduled Meds:  insulin aspart  0-9 Units Subcutaneous Q4H   insulin glargine-yfgn  7 Units Subcutaneous QHS   levothyroxine  112 mcg Oral Q0600   melatonin  3 mg Oral QHS   metoprolol tartrate  25 mg Oral BID   potassium chloride  40 mEq Oral BID   simvastatin  20 mg Oral QHS   Continuous Infusions:  sodium chloride 75 mL/hr at 09/19/21 1640     LOS: 6 days    Time spent on coordination of care, case  discussed with transitional care team, , RN and consultant nephrology/critical care, patient and family    Florencia Reasons, MD PhD FACP [facility]  Available via Epic secure chat 7am-7pm for nonurgent issues Please page for urgent issues To page the attending provider between 7A-7P or the covering provider during after hours 7P-7A, please log into the web site [email or web address] and access using universal [place] password for that web site. If you do not have the password, please call the hospital operator.    09/19/2021, 5:06 PM

## 2021-09-19 NOTE — Progress Notes (Addendum)
I have taken an interval history, reviewed the chart and examined the patient. I agree with the Advanced Practitioner's note, impression, and recommendations as outlined.   Briefly, 88 year old female with DM2, HTN and recent compression fracture of L2 who presented with altered mental status and hyperglycemia. Daughter reports recent L2 fracture leading to pain medicine use causing altered mental status. PCCM consulted for hyponatremia.  Husband and daughter at bedside with patient. Patient pleasant but confused. Once transferred to the ICU, patient agitated and altered. Found to be hypoglycemic to 46 and given juice with improvement to 89  Blood pressure (!) 181/78, pulse 74, temperature 97.7 F (36.5 C), temperature source Oral, resp. rate 19, height 5\' 7"  (1.702 m), weight 53.2 kg, SpO2 96 %. On exam, frail elderly thin female, no acute distress, agitated. Lungs CTAB, no wheezing or rhonchi. Heart RRR, no murmur. Extremities no edema, warm to touch  CT A/P 2/3 with L1 compression fracture and unchanged L2 fracture  BMP Latest Ref Rng & Units 09/19/2021 09/19/2021 09/19/2021  Glucose 70 - 99 mg/dL - 257(H) -  BUN 8 - 23 mg/dL - 14 -  Creatinine 0.44 - 1.00 mg/dL - 0.82 -  Sodium 135 - 145 mmol/L 126(L) 119(LL) 121(L)  Potassium 3.5 - 5.1 mmol/L - 4.2 -  Chloride 98 - 111 mmol/L - 87(L) -  CO2 22 - 32 mmol/L - 20(L) -  Calcium 8.9 - 10.3 mg/dL - 9.1 -   Altered metabolic encephalopathy/Acute delirium  Thiazide induced hyponatremia --Thiazide Dc'd 2/8 --Increase NaCl gtt to 75 cc/hr --Trend Na --Obtain BMET now  Hyper and hypoglycemia --CBG q4 --SSI --Hypoglycemia protocol  Hypothyroidism --F/u free T4  Hypertension --PRN hydralazine  L1, L2 compression fracture --PRN Tylenol  Hematuria, right renal mass --Foley in place  GOC discussed with family. Wishes to have patient full code with intubation and chest compressions if indicated.  The patient is critically ill with  acute metabolic encephalopathy and acute delirium and requires high complexity decision making for assessment and support, frequent evaluation and titration of therapies, application of advanced monitoring technologies and extensive interpretation of multiple databases.  Independent Critical Care Time: 35 Minutes.   Rodman Pickle, M.D. [facility] Pulmonary/Critical Care Medicine 09/19/2021 5:22 PM   Please see Amion for pager number to reach on-call Pulmonary and Critical Care Team.

## 2021-09-19 NOTE — Consult Note (Signed)
° °  [facility] Inpatient Consult   09/19/2021  KEYLA MILONE August 28, 1928 [id number]   [place] Organization [ACO] Patient: [employer] Medicare  Primary Care Provider:  Binnie Rail, MD is a provider with [facility] which is an embedded provider with a Chronic Care Management team and program, and is listed for the transition of care follow up and appointments.  Patient was screened for Embedded practice service needs for chronic care management and notes that patient Is being recommended for a SNF for rehab. Met with patient daughter and husband at the bedside.  Patient resting quietly in bed. Explained follow up with PCP follow up when returning to community.  Gave daughter a reminder card and a 24 hour nurse advise line magnet.  Plan: Continue to follow for disposition needs and if patient transitions to a skilled facility for rehab the patient's post hospital care need are to be met at that level of care.  Please contact for further questions,  Natividad Brood, RN BSN [facility] Liaison  [phone] business mobile phone Toll free office [phone]  Fax number: [phone] [email or web address]

## 2021-09-19 NOTE — Consult Note (Addendum)
[name] Admit Date: 09/12/2021 09/19/2021 [name] Requesting Physician:  Dr. Roda Shutters  Reason for Consult:  Hyponatremia  HPI:  History of PAF refused anticoagulation in the past, insulin-dependent type 2 diabetes, hypertension, known right renal mass with intermittent hematuria, followed with urology in [place], recent diagnosis of compression fracture L2 in January 2023 presented with confusion found to have hyperglycemia, report glucometer malfunction, also have AKI  Nephrology was consulted on 2/10 for her worsening hyponatremia.   On today's interview, [name] was alert to person and time, but not place. She is pleasantly confused and states that she is on a plane with her friend [name]. She additionally states that this provider is her [name], and that she was very happy that I came to see her. She states that she is comfortable and denies any nausea, fevers, SHOB.   Her daughter and husband are at bedside and state that [name] is typically independent at baseline, she is able to drive a car and make it to her appointments until two weeks ago after seeing her husband's doctor for her compression fractures.   Creatinine (mg/dL)  Date Value  86/69/1314 0.8  08/20/2014 0.8  02/14/2014 0.8  09/25/2013 0.8  09/28/2012 0.8  08/31/2012 0.8  04/22/2012 0.8   Creat (mg/dL)  Date Value  43/82/2914 0.61  08/18/2013 0.59   Creatinine, Ser (mg/dL)  Date Value  51/31/9683 0.82  09/18/2021 0.79  09/15/2021 0.79  09/15/2021 0.79  09/14/2021 0.89  09/13/2021 0.91  09/13/2021 1.15 (H)  09/12/2021 1.26 (H)  08/13/2021 0.83  06/23/2021 0.81  I/O last 3 completed shifts: In: 673.3 [P.O.:300; I.V.:373.3] Out: 275 [Urine:275] Total I/O In: 204.1 [I.V.:204.1] Out: 450 [Urine:450]  ROS:  Balance of 12 systems is negative w/ exceptions as above  PMH  Past Medical History:  Diagnosis Date   Anxiety    Arthritis    fingers   Bowel obstruction (HCC)    Cancer  of upper-outer quadrant of female breast (HCC) 08/21/2011   ER +  PR +  Her 2 -  Ki67 9%   0.9 cm invasive lobular  Carcinoma ,s/p central lumpectomy with sentinel node biopsy,  ER/PR positive s/p re-excion on 09/16/11 with final pathology showing atypical hyperplasia    CAP (community acquired pneumonia) 12/06/2016   Diabetes mellitus type 1 (HCC)    type I- wears insulin pump   GERD (gastroesophageal reflux disease)    History of small bowel obstruction    Hypercholesteremia    Hypergastrinemia    Hypertension    Hypertension    Hyponatremia    Hypothyroid    Hypothyroidism    IBS (irritable bowel syndrome)    Osteoarthritis    Osteoporosis, post-menopausal    DEXA 12/02/11: -3.5 (with lomax gyn), declines bisphos due to bone pain   PAF (paroxysmal atrial fibrillation) (HCC)    One episode in 2008, spontaneously converted in the ED, no further treatment   Spondylosis    thoracic spine   Thyroid nodule dx 07/2011   [place] q 7mo to follow calcified L thyroid nodule (12/08/11 [place])   Umbilical hernia    PSH  Past Surgical History:  Procedure Laterality Date   ABDOMINAL HYSTERECTOMY     ABDOMINAL HYSTERECTOMY  yrs ago   APPENDECTOMY     APPENDECTOMY  age 8   BLADDER REPAIR  5 yrs ago   BREAST LUMPECTOMY     BREAST SURGERY     biopsy   BREAST SURGERY  yrs ago   br bx   BREAST SURGERY  01/ 23/2013   left central lumpectomy and snbx, re-excision lumpectomy   CARDIOVASCULAR STRESS TEST  03/17/2012   colon surgery     Small intestines- took 6 inches out   COLONOSCOPY  07/11/2015   [facility] Dr Derrill Kay   ESOPHAGOGASTRODUODENOSCOPY  07/17/2010   [facility]   LEFT HEART CATHETERIZATION WITH CORONARY ANGIOGRAM N/A 03/18/2012   Procedure: LEFT HEART CATHETERIZATION WITH CORONARY ANGIOGRAM;  Surgeon: Minus Breeding, MD;  Location: [facility] CATH LAB;  Service: Cardiovascular;  Laterality: N/A;   TONSILLECTOMY   age 69   FH  Family History  Problem Relation Age of Onset   Cancer Sister         unknown   Hypertension Mother    Stroke Mother    Arthritis Father    Heart disease Father    Diabetes Son    Colon cancer Neg Hx    Esophageal cancer Neg Hx    SH  reports that she quit smoking about 44 years ago. Her smoking use included cigarettes. She has a 10.00 pack-year smoking history. She has never used smokeless tobacco. She reports current alcohol use. She reports that she does not use drugs. Allergies  Allergies  Allergen Reactions   Eliquis [Apixaban] Other (See Comments)    Dizziness, severe headach   Amlodipine     Dizzy, nausea   Augmentin [Amoxicillin-Pot Clavulanate] Other (See Comments)    Gi upset only no rash or hives    Chlorhexidine    Cortisone Other (See Comments)    Makes blood sugars run high   Keflex [Cephalexin] Hives and Nausea Only    Stomach upset   Omeprazole Nausea Only    Dizziness and nausea   Prednisone     Other reaction(s): Other Makes blood sugars run high   Hydrocodone Rash    Rash to chest , side back   Latex Rash    Powder in gloves causes rash; "not allergic to latex just the powder"   Home medications Prior to Admission medications   Medication Sig Start Date End Date Taking? Authorizing Provider  ALPRAZolam (XANAX) 0.5 MG tablet TAKE 1/2 TABLET TWICE DAILY AS NEEDED FOR ANXIETY OR SLEEP Patient taking differently: Take 0.25 mg by mouth 2 (two) times daily as needed for anxiety or sleep. 06/30/21   Binnie Rail, MD  aspirin EC 81 MG tablet Take 81 mg by mouth daily.    [provider]  B Complex-C (SUPER B COMPLEX PO) Take 1 tablet by mouth daily.    [provider]  BD VEO INSULIN SYRINGE U/F 31G X 15/64" 0.3 ML MISC USE WITH INJECTIONS 07/28/21   Burns, Claudina Lick, MD  bisacodyl (DULCOLAX) 10 MG suppository Place 1 suppository (10 mg total) rectally as needed for moderate constipation. 04/27/19   Binnie Rail, MD  carboxymethylcellulose (REFRESH PLUS) 0.5 % SOLN Place 1 drop into both eyes 3 (three) times  daily as needed (dry eyes). Non-preservative    [provider]  cholecalciferol (VITAMIN D) 1000 UNITS tablet Take 5,000 Units by mouth daily.     [provider]  DUREZOL 0.05 % EMUL Place 1 drop into the right eye 3 (three) times daily. 04/01/21   [provider]  fish oil-omega-3 fatty acids 1000 MG capsule Take 2 g by mouth daily.    [provider]  hydrochlorothiazide (HYDRODIURIL) 25 MG tablet TAKE (1) TABLET DAILY AS NEEDED. Patient taking  differently: Take 25 mg by mouth daily as needed. 07/30/21   Binnie Rail, MD  insulin glargine (LANTUS) 100 UNIT/ML injection Inject 8 Units into the skin at bedtime.    [provider]  insulin lispro (HUMALOG) 100 UNIT/ML KiwkPen Inject 0-12 Units into the skin with breakfast, with lunch, and with evening meal. Per sliding scale, max of 35 units per day    [provider]  levothyroxine (SYNTHROID) 112 MCG tablet Take 112 mcg by mouth daily. 03/22/20   [provider]  losartan (COZAAR) 100 MG tablet TAKE 1 TABLET ONCE DAILY. Patient taking differently: Take 100 mg by mouth daily. 03/20/21   Binnie Rail, MD  methocarbamol (ROBAXIN) 500 MG tablet Take 1 tablet (500 mg total) by mouth at bedtime as needed for muscle spasms. 04/22/21   Binnie Rail, MD  metoprolol succinate (TOPROL-XL) 25 MG 24 hr tablet TAKE 1/2 TABLET AS NEEDED FOR PALPITATIONS. Patient taking differently: Take 12.5 mg by mouth daily as needed (heart palpitations). 02/21/21   Evans Lance, MD  Multiple Vitamin (MULTIVITAMIN) tablet Take 1 tablet by mouth daily. For breast and bone health    [provider]  ondansetron (ZOFRAN) 4 MG tablet Take 1 tablet (4 mg total) by mouth every 8 (eight) hours as needed for nausea or vomiting. 06/30/21   Binnie Rail, MD  potassium chloride SA (KLOR-CON M) 20 MEQ tablet Take 1 tablet (20 mEq total) by mouth 2 (two) times daily. 08/14/21   Horton, Barbette Hair, MD   saccharomyces boulardii (FLORASTOR) 250 MG capsule Take 1 capsule (250 mg total) by mouth 2 (two) times daily. 04/04/19   Binnie Rail, MD  simvastatin (ZOCOR) 20 MG tablet TAKE ONE TABLET AT BEDTIME. Patient taking differently: Take 20 mg by mouth at bedtime. 11/27/20   Binnie Rail, MD  traMADol (ULTRAM) 50 MG tablet Take 1 tablet (50 mg total) by mouth every 8 (eight) hours as needed for severe pain (lumbar compression fractures). 08/19/21   Binnie Rail, MD    Current Medications Scheduled Meds:  insulin aspart  0-5 Units Subcutaneous QHS   insulin aspart  0-9 Units Subcutaneous TID WC   insulin aspart  3 Units Subcutaneous TID WC   insulin glargine-yfgn  7 Units Subcutaneous QHS   levothyroxine  112 mcg Oral Q0600   melatonin  3 mg Oral QHS   metoprolol tartrate  25 mg Oral BID   potassium chloride  40 mEq Oral BID   simvastatin  20 mg Oral QHS   Continuous Infusions:  sodium chloride 25 mL/hr at 09/19/21 0550   PRN Meds:.dextrose, hydrALAZINE, methocarbamol, metoprolol succinate, simethicone  CBC Recent Labs  Lab 09/12/21 1632 09/15/21 0452 09/18/21 0116  WBC 7.1 6.9 7.6  NEUTROABS 6.0  --   --   HGB 12.9 12.6 11.6*  HCT 38.2 36.3 33.4*  MCV 91.0 87.7 86.1  PLT 270 226 482   Basic Metabolic Panel Recent Labs  Lab 09/13/21 0304 09/13/21 2050 09/14/21 0131 09/15/21 0452 09/15/21 2331 09/18/21 0116 09/18/21 1640 09/18/21 2038 09/19/21 0049 09/19/21 0403  NA 128* 129* 131* 127* 129* 123* 124* 121* 121* 119*  K 3.4* 3.4* 3.1* 3.5 4.3 3.4*  --   --   --  4.2  CL 96* 100 100 92* 96* 89*  --   --   --  87*  CO2 14* $Remov'22 23 23 22 23  'sMvVjX$ --   --   --  20*  GLUCOSE 302* 141* 154* 238* 195* 211*  --   --   --  257*  BUN $Re'21 17 15 10 9 10  'tRA$ --   --   --  14  CREATININE 1.15* 0.91 0.89 0.79 0.79 0.79  --   --   --  0.82  CALCIUM 9.6 10.0 9.4 9.5 9.8 9.5  --   --   --  9.1    Physical Exam   Blood pressure 132/74, pulse 70, temperature 97.6 F (36.4 C), temperature  source Oral, resp. rate 19, height $RemoveBe'5\' 7"'pyQAXABsV$  (1.702 m), weight 53.2 kg, SpO2 99 %. Physical Exam Constitutional:      General: She is not in acute distress.    Appearance: She is not ill-appearing, toxic-appearing or diaphoretic.     Comments: Resting comfortably in bed, pleasantly altered, NAD  Cardiovascular:     Rate and Rhythm: Normal rate and regular rhythm.     Pulses: Normal pulses.     Heart sounds: Normal heart sounds. No murmur heard.   No friction rub. No gallop.  Abdominal:     General: Abdomen is flat. Bowel sounds are normal. There is no distension.     Palpations: Abdomen is soft.     Tenderness: There is no abdominal tenderness. There is no guarding.  Musculoskeletal:     Right lower leg: No edema.     Left lower leg: No edema.  Skin:    General: Skin is warm and dry.     Findings: No rash.  Neurological:     Mental Status: She is alert.     Comments: Oriented to person and year    Assessment #Chronic Moderate Hyponatremia:  88 y/o female with pertinent Hx of Hypothyroidism and HTN (on HCTZ) who presented to [facility] with Hyperglycemia due to DM and Nephrology was consulted in regards to Na of 119 on labs this morning. On admission her Na was 125 with a BL of 129-134. Her Na today corrects to 123 in the setting of her glucose of 257. On initial review, we have Dc'd her HCTZ, as this predisposes to hypoNa, and it appears her hypothyroidism is uncontrolled, 2 months ago her TSH was 12, on repeat during this hospitalization, >500. Will need to repeat her TSH as this may be an error, but may indicate myxedema coma. On lab review, urine osms of 441, urine Na of 134 indicating likely HCTZ use, uncontrolled hypothyroidism, and likely SIADH. Early AM cortisol of 20 which eliminates adrenal insufficiency. Given her confusion, which appears to have been present since admission, and her hyponatremia nephrology recommends administering 3% saline and transfer to the ICU for close observation.  Recommendations discussed with primary team.  - 3% Saline IV 30 mL/hr - Transfer to the ICU - Trend Na Q3H  - Frequent neuro checks - DC'd HCTZ - Repeat TSH, fT4  Metabolic Encephalopathy:  Patient presents with uncontrolled hyperglycemia, hypothyroidism, and hyponatremia presented with AMS.   - Continue Hyperglycemic control - Transferring to ICU for administration of 3% saline   Hyperglycemia in the Setting of Uncontrolled DM:  - Continue management per primary   Compression Fracture of L2:  Present on admission, was seen by spine surgeon on 2/1 in the clinic.  - Pain control per primary  Right Renal Mass Hematuria:  -Per primary and urology  Hx of A. Fib:  Currently in NSR, refused anticoagulation in the past.  - Continue to monitor  Maudie Mercury, MD IMTS, PGY-3 09/19/2021,2:21 PM  ------------------------------------------  Attending addendum:   Pt with hx of hypothyroidism, afib who presented with ams and uncontrolled DM - this was felt secondary to meter malfunction at home.  Her sodium has been worsening.  She has been on HCTZ for a while.  Started a new pain med for her back a couple of weeks ago.  Driving her car two weeks ago.  Very functional normally.  Drives from home to [place] or [place] sometimes.  She is pleasantly confused.  She is glad that I have brought her grandson, Gerald Stabs ([facility] resident Maudie Mercury, MD) with me  General elderly female in bed in no acute distress HEENT normocephalic atraumatic extraocular movements intact sclera anicteric Neck supple trachea midline Lungs clear to auscultation bilaterally normal work of breathing at rest  Heart regular rate and rhythm no rubs or gallops appreciated Abdomen soft nontender nondistended Extremities no edema  Psych normal mood and affect Neuro - pleasantly confused. Oriented to name and year of "[birth year]" but states that we are in an airplane with her friend.  Initially we are "next to that building".    #  Symptomatic hyponatremia  S/p hydration earlier in her course some of which was with dextrose-containing fluids with increased free water.  She has been on HCTZ which was just stopped  - transfer to the ICU for 3% saline  - start 3% saline at 30 ml/hr - sodium levels every 3 hours  - may benefit from additional saline once HCTZ is further out of her system  - repeat TSH  # encephalopathy  - felt presently 2/2 hyponatremia.  Initially with hyperglycemia after her glucometer malfunctioned.    # Gross hematuria  - urology has seen her - they felt secondary to presumable right upper tract urothelial carcinoma.  Urology recommended a foley if she was found to have elevated PVR's - she now has a foley.  No hydro  # Hypothyroidism  - Patient with TSH > 500 appears inaccurate - continue replacement at current dose for now and repeat TSH to further guide care   # Afib  - hx of, not on anticoagulation   # DM type 2 - per primary team    Disposition - moving to the ICU   Claudia Desanctis, MD 5:11 PM 09/19/2021

## 2021-09-20 DIAGNOSIS — E871 Hypo-osmolality and hyponatremia: Secondary | ICD-10-CM

## 2021-09-20 LAB — BASIC METABOLIC PANEL
Anion gap: 10 (ref 5–15)
Anion gap: 9 (ref 5–15)
BUN: 16 mg/dL (ref 8–23)
BUN: 18 mg/dL (ref 8–23)
CO2: 20 mmol/L — ABNORMAL LOW (ref 22–32)
CO2: 21 mmol/L — ABNORMAL LOW (ref 22–32)
Calcium: 9.1 mg/dL (ref 8.9–10.3)
Calcium: 9.8 mg/dL (ref 8.9–10.3)
Chloride: 97 mmol/L — ABNORMAL LOW (ref 98–111)
Chloride: 98 mmol/L (ref 98–111)
Creatinine, Ser: 0.8 mg/dL (ref 0.44–1.00)
Creatinine, Ser: 0.96 mg/dL (ref 0.44–1.00)
GFR, Estimated: >60 mL/min (ref >60)
GFR, Estimated: 56 mL/min — ABNORMAL LOW (ref >60)
Glucose, Bld: 87 mg/dL (ref 70–99)
Glucose, Bld: 174 mg/dL — ABNORMAL HIGH (ref 70–99)
Potassium: 3.6 mmol/L (ref 3.5–5.1)
Potassium: 4.6 mmol/L (ref 3.5–5.1)
Sodium: 127 mmol/L — ABNORMAL LOW (ref 135–145)
Sodium: 128 mmol/L — ABNORMAL LOW (ref 135–145)

## 2021-09-20 LAB — COMPREHENSIVE METABOLIC PANEL
ALT: 19 U/L (ref 0–44)
AST: 29 U/L (ref 15–41)
Albumin: 3.3 g/dL — ABNORMAL LOW (ref 3.5–5.0)
Alkaline Phosphatase: 74 U/L (ref 38–126)
Anion gap: 10 (ref 5–15)
BUN: 16 mg/dL (ref 8–23)
CO2: 21 mmol/L — ABNORMAL LOW (ref 22–32)
Calcium: 9.4 mg/dL (ref 8.9–10.3)
Chloride: 95 mmol/L — ABNORMAL LOW (ref 98–111)
Creatinine, Ser: 0.85 mg/dL (ref 0.44–1.00)
GFR, Estimated: >60 mL/min (ref >60)
Glucose, Bld: 91 mg/dL (ref 70–99)
Potassium: 3.6 mmol/L (ref 3.5–5.1)
Sodium: 126 mmol/L — ABNORMAL LOW (ref 135–145)
Total Bilirubin: 0.9 mg/dL (ref 0.3–1.2)
Total Protein: 6.9 g/dL (ref 6.5–8.1)

## 2021-09-20 LAB — CBC WITH DIFFERENTIAL/PLATELET
Abs Immature Granulocytes: 0.03 10*3/uL (ref 0.00–0.07)
Basophils Absolute: 0 10*3/uL (ref 0.0–0.1)
Basophils Relative: 0 %
Eosinophils Absolute: 0.1 10*3/uL (ref 0.0–0.5)
Eosinophils Relative: 1 %
HCT: 33.1 % — ABNORMAL LOW (ref 36.0–46.0)
Hemoglobin: 11.7 g/dL — ABNORMAL LOW (ref 12.0–15.0)
Immature Granulocytes: 1 %
Lymphocytes Relative: 27 %
Lymphs Abs: 1.8 10*3/uL (ref 0.7–4.0)
MCH: 30.8 pg (ref 26.0–34.0)
MCHC: 35.3 g/dL (ref 30.0–36.0)
MCV: 87.1 fL (ref 80.0–100.0)
Monocytes Absolute: 1.1 10*3/uL — ABNORMAL HIGH (ref 0.1–1.0)
Monocytes Relative: 16 %
Neutro Abs: 3.5 10*3/uL (ref 1.7–7.7)
Neutrophils Relative %: 55 %
Platelets: 257 10*3/uL (ref 150–400)
RBC: 3.8 MIL/uL — ABNORMAL LOW (ref 3.87–5.11)
RDW: 13.5 % (ref 11.5–15.5)
WBC: 6.5 10*3/uL (ref 4.0–10.5)
nRBC: 0 % (ref 0.0–0.2)

## 2021-09-20 LAB — GLUCOSE, CAPILLARY
Glucose-Capillary: 83 mg/dL (ref 70–99)
Glucose-Capillary: 80 mg/dL (ref 70–99)
Glucose-Capillary: 112 mg/dL — ABNORMAL HIGH (ref 70–99)
Glucose-Capillary: 176 mg/dL — ABNORMAL HIGH (ref 70–99)
Glucose-Capillary: 148 mg/dL — ABNORMAL HIGH (ref 70–99)

## 2021-09-20 LAB — SODIUM
Sodium: 124 mmol/L — ABNORMAL LOW (ref 135–145)
Sodium: 128 mmol/L — ABNORMAL LOW (ref 135–145)

## 2021-09-20 LAB — TSH: TSH: 462.936 u[IU]/mL — ABNORMAL HIGH (ref 0.350–4.500)

## 2021-09-20 NOTE — Consult Note (Signed)
NAME:  [name], MRN:  [id number], DOB:  Feb 14, 1929, LOS: 7 ADMISSION DATE:  09/12/2021, CONSULTATION DATE:  09/19/2021 REFERRING MD: Florencia Reasons, MD CHIEF COMPLAINT:  Altered Mental Status   History of Present Illness:  [name] is a 88 year old female living with type HLD, HTN, recent compression fracture of L2 vertebrae in January, presumable right upper tract urothelial carcinoma, type 1 diabetes mellitus and hypothyroidism who presented to ED with confusion and hyperglycemia.  This is hospital day #7 of her admission and we were asked to consult for worsening hyponatremia.  On exam patient is confused and family is bedside. Daughter states that patient's confusion began 2 days prior to admission after being prescribed Tramadol by per PCP. During this hospitalization, approximately 2 days ago, patient's mental status seemed to further worsen. No other new symptoms, including SOB, chest pain.  Per chart review, patient presented to the ED with c/o hyperglycemia and confusion. Patient typically takes sliding scale NovoLog and 14 units Lantus nightly, has not been taking her insulin over the past 4-5 days since her home glucometer was not functioning and she was not able to obtain a new one.  Blood pressure was elevated up to 162/110 on initial ED evaluation, patient was also complaining of some left lower quadrant abdominal pain though exam was normal.  Labs demonstrated pseudohyponatremia, corrected sodium is within normal range, AKI with creatinine 1.26, her baseline over the past year or creatinine about 1.74-0.83, glucose to 469, glucose on UA greater than 500, ketones on urine greater than 80, UA also showed epithelial cells, no WBC.  EKG was normal sinus rhythm.  Patient was given bolus normal saline, NovoLog 12 units subq.  Pertinent  Medical History   Past Medical History:  Diagnosis Date   Anxiety    Arthritis    fingers   Bowel obstruction ([place])    Cancer of upper-outer quadrant  of female breast ([place]) 08/21/2011   ER +  PR +  Her 2 -  Ki67 9%   0.9 cm invasive lobular  Carcinoma ,s/p central lumpectomy with sentinel node biopsy,  ER/PR positive s/p re-excion on 09/16/11 with final pathology showing atypical hyperplasia    CAP (community acquired pneumonia) 12/06/2016   Diabetes mellitus type 1 ([place])    type I- wears insulin pump   GERD (gastroesophageal reflux disease)    History of small bowel obstruction    Hypercholesteremia    Hypergastrinemia    Hypertension    Hypertension    Hyponatremia    Hypothyroid    Hypothyroidism    IBS (irritable bowel syndrome)    Osteoarthritis    Osteoporosis, post-menopausal    DEXA 12/02/11: -3.5 (with lomax gyn), declines bisphos due to bone pain   PAF (paroxysmal atrial fibrillation) ([place])    One episode in 2008, spontaneously converted in the ED, no further treatment   Spondylosis    thoracic spine   Thyroid nodule dx 07/2011   [place] q 35moto follow calcified L thyroid nodule (43/61/[place]   Umbilical hernia    Significant Hospital Events: Including procedures, antibiotic start and stop dates in addition to other pertinent events   2/03: [facility] 2/10: PCCM and Nephrology consulted for worsening hyponatremia and confusion  Interim History / Subjective:   Na 119 > 127  Objective   Blood pressure (!) 150/132, pulse (!) 58, temperature 98 F (36.7 C), temperature source Oral, resp. rate 17, height _0  (1.702 m),  weight 53.2 kg, SpO2 99 %.        Intake/Output Summary (Last 24 hours) at 09/20/2021 0732 Last data filed at 09/20/2021 0500 Gross per 24 hour  Intake 766.64 ml  Output 450 ml  Net 316.64 ml   Filed Weights   09/12/21 1614 09/16/21 0800 09/17/21 0456  Weight: 53 kg 53 kg 53.2 kg   Examination: General: Thin elderly woman, laying in bed in no distress HENT: Oropharynx clear, pupils equal, strong voice Lungs: Clear bilaterally, no wheezes or crackles Cardiovascular:  Regular, no murmur Abdomen: Nondistended, positive bowel sounds Extremities: No edema Neuro: Awake, interacting, appears to be at least partially oriented.  Believed she was in the hospital in [place] (not [place]) Psych: No agitation or hallucinations GU: Deferred   Resolved Hospital Problem list     Assessment & Plan:   # Acute on Chronic Hyponatremia , moderate History of hyponatremia and noted under problem list to be in setting of HCTZ in 2001.  Patient is coming on HCTZ and continued on admission. Last dose 2/8.  Initial sodiums corrected > 130 , appears serum sodium was staying stable with normal IV saline administration. Once fluids were stopped patient had acute worsening. HCTZ on hold now. To 127 on 2/11 -Agree with discontinuation 3% NaCl on 2/11 -Does not tolerate and should not be restarted on thiazide diuretics -Continue to follow sodium trend and mental status -If next sodium stable then should Breo transition out of ICU setting  # Hypothyroidism  Patients TSH 9.2 04/28/2021, increased dose to 125 mcg. TSH markedly elevated 06/2021. Patient on 112 mcg on admission.  -Repeat TSH 2/11, unclear whether admission value/labs accurate (>500) -Adjust levothyroxine according to updated lab value  # Acute Metabolic Encephalopathy  Possible contributors: Hypoglycemia, opioid effects, hyponatremia -Working to correct underlying metabolic contributors -Avoid sedating medication -Consider head CT if remains altered  # Type I Diabetes Mellitus w/ Hyperglycemia  A1c of 6.9% on admission.  -Sensitive scale SSI -Semglee 7 units nightly  # Hypertension  -Metoprolol -Hydralazine available if needed -Home lisinopril on hold, would never restart HCTZ  # L2 Compression Fracture # Osteoporosis  -Tylenol as needed  # Right Renal Mass w/ Hematuria  -Foley in place as per urology recommendations  Best Practice (right click and "Reselect all SmartList Selections" daily)    Diet/type: Regular consistency (see orders) DVT prophylaxis: SCD GI prophylaxis: N/A Lines: N/A Foley:  N/A Code Status:  full code Last date of multidisciplinary goals of care discussion [Family updated at bedside on 2/10]  Labs   CBC: Recent Labs  Lab 09/15/21 0452 09/18/21 0116 09/20/21 0258  WBC 6.9 7.6 6.5  NEUTROABS  --   --  3.5  HGB 12.6 11.6* 11.7*  HCT 36.3 33.4* 33.1*  MCV 87.7 86.1 87.1  PLT 226 226 962    Basic Metabolic Panel: Recent Labs  Lab 09/13/21 1611 09/13/21 2050 09/14/21 0131 09/15/21 0452 09/18/21 0116 09/18/21 1640 09/19/21 0403 09/19/21 1515 09/19/21 2153 09/19/21 2349 09/20/21 0258 09/20/21 0526  NA  --    < > 131*   < > 123*   < > 119* 126* 124* 124* 126* 127*  K  --    < > 3.1*   < > 3.4*  --  4.2  --  3.6  --  3.6 3.6  CL  --    < > 100   < > 89*  --  87*  --  94*  --  95* 97*  CO2  --    < > 23   < > 23  --  20*  --  21*  --  21* 20*  GLUCOSE  --    < > 154*   < > 211*  --  257*  --  136*  --  91 87  BUN  --    < > 15   < > 10  --  14  --  17  --  16 16  CREATININE  --    < > 0.89   < > 0.79  --  0.82  --  0.84  --  0.85 0.80  CALCIUM  --    < > 9.4   < > 9.5  --  9.1  --  9.2  --  9.4 9.1  MG 1.4*  --  2.1  --  1.3*  --  1.6*  --   --   --   --   --    < > = values in this interval not displayed.   GFR: Estimated Creatinine Clearance: 39.3 mL/min (by C-G formula based on SCr of 0.8 mg/dL). Recent Labs  Lab 09/15/21 0452 09/18/21 0116 09/20/21 0258  WBC 6.9 7.6 6.5   Liver Function Tests: Recent Labs  Lab 09/20/21 0258  AST 29  ALT 19  ALKPHOS 74  BILITOT 0.9  PROT 6.9  ALBUMIN 3.3*   No results for input(s): LIPASE, AMYLASE in the last 168 hours. No results for input(s): AMMONIA in the last 168 hours.  ABG    Component Value Date/Time   HCO3 26.3 (H) 02/23/2007 0509   TCO2 27 02/23/2007 0509    Coagulation Profile: No results for input(s): INR, PROTIME in the last 168 hours.  Cardiac Enzymes: No  results for input(s): CKTOTAL, CKMB, CKMBINDEX, TROPONINI in the last 168 hours.  HbA1C: Hgb A1c MFr Bld  Date/Time Value Ref Range Status  09/13/2021 04:11 PM 6.9 (H) 4.8 - 5.6 % Final    Comment:    (NOTE) Pre diabetes:          5.7%-6.4%  Diabetes:              >6.4%  Glycemic control for   <7.0% adults with diabetes   09/13/2021 01:00 AM 6.9 (H) 4.8 - 5.6 % Final    Comment:    (NOTE) Pre diabetes:          5.7%-6.4%  Diabetes:              >6.4%  Glycemic control for   <7.0% adults with diabetes    CBG: Recent Labs  Lab 09/19/21 1652 09/19/21 1941 09/19/21 2353 09/20/21 0336 09/20/21 0712  GLUCAP 89 147* 115* 83 80   Independent CC time 31 minutes  Baltazar Apo, MD, PhD 09/20/2021, 7:32 AM [facility] [phone] or if no answer before 7:00PM call [phone] For any issues after 7:00PM please call eLink [phone]

## 2021-09-20 NOTE — Progress Notes (Signed)
[facility] Progress Note  Name: [name] MRN: [id number] DOB: 11-Feb-1929  Chief Complaint:  Altered mental status   Subjective:  she was moved to the ICU.  Team initially elected for normal saline.  Labs unchanged with fluids at 124.  She was transitioned to 3% saline at 30 ml/hr at 22:49 on 2/10.  She had 450 mL uop over 2/10 charted.    Review of systems:  Denies shortness of breath or chest pain  Denies n/v    Intake/Output Summary (Last 24 hours) at 09/20/2021 0653 Last data filed at 09/20/2021 0500 Gross per 24 hour  Intake 766.64 ml  Output 450 ml  Net 316.64 ml    Vitals:  Vitals:   09/20/21 0300 09/20/21 0338 09/20/21 0400 09/20/21 0500  BP:   (!) 150/132   Pulse:   (!) 59 (!) 58  Resp: 16  16 17   Temp:  98 F (36.7 C)    TempSrc:  Oral    SpO2:   96% 99%  Weight:      Height:         Physical Exam:  General elderly female in bed in no acute distress HEENT normocephalic atraumatic extraocular movements intact sclera anicteric Neck supple trachea midline Lungs clear to auscultation bilaterally normal work of breathing at rest  Heart regular rate and rhythm no rubs or gallops appreciated Abdomen soft nontender nondistended Extremities no edema  Psych normal mood and affect Neuro - pleasantly confused. Oriented to name and year of "48" and states that we are at [facility]    Medications reviewed   Labs:  BMP Latest Ref Rng & Units 09/20/2021 09/20/2021 09/19/2021  Glucose 70 - 99 mg/dL 87 91 -  BUN 8 - 23 mg/dL 16 16 -  Creatinine 0.44 - 1.00 mg/dL 0.80 0.85 -  Sodium 135 - 145 mmol/L 127(L) 126(L) 124(L)  Potassium 3.5 - 5.1 mmol/L 3.6 3.6 -  Chloride 98 - 111 mmol/L 97(L) 95(L) -  CO2 22 - 32 mmol/L 20(L) 21(L) -  Calcium 8.9 - 10.3 mg/dL 9.1 9.4 -     Assessment/Plan:   # Symptomatic hyponatremia  - S/p hydration earlier in her course some of which was with dextrose-containing fluids with  increased free water.  She has been on HCTZ which was just stopped  - stop 3% saline as Na is now 127 - repeat sodium at noon  - may benefit from additional saline once HCTZ is further out of her system  - repeat TSH was cancelled  - check a TSH as first does not appear accurate  - I have added HCTZ to allergies list and would not ever resume    # encephalopathy  - felt presently 2/2 hyponatremia.  Initially with hyperglycemia after her glucometer malfunctioned.     # Gross hematuria  - urology has seen her - they felt secondary to presumable right upper tract urothelial carcinoma.  Urology recommended a large bore foley if she was found to have elevated PVR's - she now has a foley.  No hydro   # Hypothyroidism  - Patient with TSH > 500 appears inaccurate  - continue replacement at current dose for now and repeat TSH to further guide care    # Afib  - hx of, not on anticoagulation    # DM type 2 - per primary team     Disposition - continue inpatient monitoring   Claudia Desanctis, MD 09/20/2021  7:18 AM

## 2021-09-21 DIAGNOSIS — E1165 Type 2 diabetes mellitus with hyperglycemia: Secondary | ICD-10-CM | POA: Diagnosis not present

## 2021-09-21 LAB — BASIC METABOLIC PANEL
Anion gap: 10 (ref 5–15)
BUN: 18 mg/dL (ref 8–23)
CO2: 18 mmol/L — ABNORMAL LOW (ref 22–32)
Calcium: 9.4 mg/dL (ref 8.9–10.3)
Chloride: 101 mmol/L (ref 98–111)
Creatinine, Ser: 0.86 mg/dL (ref 0.44–1.00)
GFR, Estimated: >60 mL/min (ref >60)
Glucose, Bld: 99 mg/dL (ref 70–99)
Potassium: 3.9 mmol/L (ref 3.5–5.1)
Sodium: 129 mmol/L — ABNORMAL LOW (ref 135–145)

## 2021-09-21 LAB — GLUCOSE, CAPILLARY
Glucose-Capillary: 195 mg/dL — ABNORMAL HIGH (ref 70–99)
Glucose-Capillary: 206 mg/dL — ABNORMAL HIGH (ref 70–99)
Glucose-Capillary: 277 mg/dL — ABNORMAL HIGH (ref 70–99)
Glucose-Capillary: 303 mg/dL — ABNORMAL HIGH (ref 70–99)
Glucose-Capillary: 230 mg/dL — ABNORMAL HIGH (ref 70–99)

## 2021-09-21 LAB — T4, FREE: Free T4: 1.23 ng/dL — ABNORMAL HIGH (ref 0.61–1.12)

## 2021-09-21 LAB — MAGNESIUM: Magnesium: 1.6 mg/dL — ABNORMAL LOW (ref 1.7–2.4)

## 2021-09-21 LAB — TSH: TSH: 428.957 u[IU]/mL — ABNORMAL HIGH (ref 0.350–4.500)

## 2021-09-21 LAB — PHOSPHORUS: Phosphorus: 3 mg/dL (ref 2.5–4.6)

## 2021-09-21 MED ORDER — INSULIN ASPART 100 UNIT/ML IJ SOLN: 3.0000 [IU] | Freq: Once | INTRAMUSCULAR | Status: DC

## 2021-09-21 MED ORDER — INSULIN ASPART 100 UNIT/ML IJ SOLN
0.0000 [IU] | Freq: Three times a day (TID) | INTRAMUSCULAR | Status: DC
  Administered 2021-09-22 (×3): 2 [IU] via SUBCUTANEOUS
  Administered 2021-09-23: 1 [IU] via SUBCUTANEOUS
  Administered 2021-09-23: 2 [IU] via SUBCUTANEOUS
  Administered 2021-09-23: 3 [IU] via SUBCUTANEOUS
  Administered 2021-09-24 (×2): 1 [IU] via SUBCUTANEOUS

## 2021-09-21 MED ORDER — MAGNESIUM SULFATE 2 GM/50ML IV SOLN
2.0000 g | Freq: Once | INTRAVENOUS | Status: AC
  Administered 2021-09-21: 2 g via INTRAVENOUS
  Filled 2021-09-21: qty 50

## 2021-09-21 MED ORDER — PROCHLORPERAZINE EDISYLATE 10 MG/2ML IJ SOLN
5.0000 mg | Freq: Once | INTRAMUSCULAR | Status: AC | PRN
  Administered 2021-09-21: 5 mg via INTRAVENOUS
  Filled 2021-09-21: qty 2

## 2021-09-21 MED ORDER — POTASSIUM CHLORIDE CRYS ER 20 MEQ PO TBCR
40.0000 meq | EXTENDED_RELEASE_TABLET | Freq: Every day | ORAL | Status: DC
  Administered 2021-09-22 – 2021-09-24 (×3): 40 meq via ORAL
  Filled 2021-09-21 (×3): qty 2

## 2021-09-21 MED ORDER — INSULIN ASPART 100 UNIT/ML IJ SOLN
0.0000 [IU] | Freq: Every day | INTRAMUSCULAR | Status: DC
  Administered 2021-09-21: 4 [IU] via SUBCUTANEOUS
  Administered 2021-09-22 – 2021-09-23 (×2): 2 [IU] via SUBCUTANEOUS

## 2021-09-21 NOTE — Progress Notes (Signed)
Cross-coverage note:   Patient noted to be anxious, complained of nausea and headache. Chart reviewed, sodium correcting at appropriate rate, not hypoglycemic. QT had been prolonged on prior EKG but has now improved as noted on monitor. Plan to give a dose of compazine and Tylenol.

## 2021-09-21 NOTE — Progress Notes (Addendum)
PROGRESS NOTE                                                                                                                                                                                                             Patient Demographics:    [name], is a 88 y.o. female, DOB - [birth year], BJY:[id number]  Outpatient Primary MD for the patient is Binnie Rail, MD    LOS - 8  Admit date - 09/12/2021    No chief complaint on file.      Brief Narrative (HPI from H&P)   History of PAF refused anticoagulation in the past, insulin-dependent type 2 diabetes, hypertension, known right renal mass with intermittent hematuria, followed with urology in [place], recent diagnosis of compression fracture L2 in January 2023 presented with confusion found to have hyperglycemia, report glucometer malfunction, she was also found to have AKI, severe hyponatremia with encephalopathy.  She was stabilized in ICU, Foley catheter was placed was seen by nephrology and then transferred to hospitalist service on 09/21/2021.   Subjective:    [name] today has, No headache, No chest pain, No abdominal pain - No Nausea, No new weakness tingling or numbness, no SOB.   Assessment  & Plan :    Assessment and Plan: * Hyperglycemia due to diabetes mellitus ([place]) - nonadherence to home therapy d/t glucometer malfunction, assoc w/ ketosis- (present on admission) DKA has resolved has been placed on Lantus along with sliding scale, question compliance at home.  Counseled.  Diabetic and insulin education.  Monitor CBGs.  Lab Results  Component Value Date   HGBA1C 6.9 (H) 09/13/2021   CBG (last 3)  Recent Labs    09/20/21 1940 09/21/21 0755 09/21/21 1210  GLUCAP 148* 195* 206*     Hyponatremia- (present on admission) Severe with metabolic encephalopathy caused by dehydration from HCTZ, nephrology following, hyponatremia improving defer  management of AKI and hyponatremia to nephrology.  Clinically appears dehydrated. Her encephalopathy seems to have improved and now close to baseline.  AKI (acute kidney injury) ([place]) -IV fluids -trend AM labs  Hypothyroidism- (present on admission) Extremely elevated TSH levels due to noncompliance with Synthroid, counseled, with home dose Synthroid her TSH is serially coming down, surprisingly she does not seem to be in myxedema coma.  Will monitor closely.  Hypomagnesemia replaced  Hypokalemia  replaced  Compression fracture of L2 ([place])- (present on admission) -worsening based on imaging, stable on symptoms with no focal deficits.  Outpatient follow-up with neurosurgery postdischarge if desired    Right renal mass Per PCP last note, following w/ urology outpatient, continue follow-up outpatient post discharge Nothing on UA/mico now to concern for UTI/pyelo/abscess  History of Atrial fibrillation ([place]) - currently sinus rhythm- (present on admission) -sinus on EKG in ED, no palpitations   Hypertension- (present on admission) Due to dehydration, appears dehydrated, ACE inhibitor and HCTZ held.  Defer management to nephrology.  Note she had Foley catheter placed in the ICU which will be continued for now till AKI has resolved and nephrology agreeable to removing it.         Condition - Fair  Family Communication  :  None present  Code Status :  Full  Consults  :  PCCM, Renal  PUD Prophylaxis :    Procedures  :     MR ABD - 1. The lesion of concern in the upper pole of the right kidney has imaging characteristics highly concerning for upper tract urothelial neoplasm. Referral to Urology for further clinical evaluation is strongly recommended. 2. No definite lymphadenopathy or signs of metastatic disease in the abdomen.       Disposition Plan  :    Status is: Inpatient   DVT Prophylaxis  :    Place and maintain sequential compression device Start: 09/17/21  1608   Lab Results  Component Value Date   PLT 257 09/20/2021    Diet :  Diet Order             Diet Carb Modified Fluid consistency: Thin; Room service appropriate? Yes  Diet effective now                    Inpatient Medications  Scheduled Meds:  insulin glargine-yfgn  7 Units Subcutaneous QHS   levothyroxine  112 mcg Oral Q0600   melatonin  3 mg Oral QHS   metoprolol tartrate  25 mg Oral BID   [START ON 09/22/2021] potassium chloride  40 mEq Oral Daily   simvastatin  20 mg Oral QHS   Continuous Infusions:  magnesium sulfate bolus IVPB     PRN Meds:.acetaminophen, dextrose, hydrALAZINE, simethicone  Antibiotics  :    Anti-infectives (From admission, onward)    None        Time Spent in minutes  30   Lala Lund M.D on 09/21/2021 at 12:31 PM  To page go to [email or web address]   [facility] -  Office  [phone]  See all Orders from today for further details    Objective:   Vitals:   09/21/21 0000 09/21/21 0304 09/21/21 0800 09/21/21 1200  BP: (!) 120/47 (!) 100/48 (!) 133/52 (!) 182/71  Pulse: (!) 57 61 70 63  Resp: 15 18 20 16   Temp: 98 F (36.7 C) 98.5 F (36.9 C) 98.1 F (36.7 C) 98.1 F (36.7 C)  TempSrc: Oral Axillary Axillary Oral  SpO2: 99% 100% 99% 97%  Weight:      Height:        Wt Readings from Last 3 Encounters:  09/17/21 53.2 kg  08/19/21 54.4 kg  08/13/21 54 kg    No intake or output data in the 24 hours ending 09/21/21 1231   Physical Exam  Awake Alert, No new F.N deficits, Normal affect [place].AT,PERRAL Supple Neck, No JVD,   Symmetrical Chest wall movement, Good air  movement bilaterally, CTAB RRR,No Gallops,Rubs or new Murmurs,  +ve B.Sounds, Abd Soft, No tenderness,   No Cyanosis, Foley catheter in place with dark concentrated urine    RN pressure injury documentation: Pressure Injury 09/19/21 Sacrum Mid Stage 1 -  Intact skin with non-blanchable redness of a localized area usually over a bony  prominence. (Active)  09/19/21 1600  Location: Sacrum  Location Orientation: Mid  Staging: Stage 1 -  Intact skin with non-blanchable redness of a localized area usually over a bony prominence.  Wound Description (Comments):   Present on Admission:      Data Review:    CBC Recent Labs  Lab 09/15/21 0452 09/18/21 0116 09/20/21 0258  WBC 6.9 7.6 6.5  HGB 12.6 11.6* 11.7*  HCT 36.3 33.4* 33.1*  PLT 226 226 257  MCV 87.7 86.1 87.1  MCH 30.4 29.9 30.8  MCHC 34.7 34.7 35.3  RDW 13.5 13.2 13.5  LYMPHSABS  --   --  1.8  MONOABS  --   --  1.1*  EOSABS  --   --  0.1  BASOSABS  --   --  0.0    Electrolytes Recent Labs  Lab 09/18/21 0116 09/18/21 0931 09/18/21 1640 09/19/21 0403 09/19/21 1515 09/19/21 2153 09/19/21 2349 09/20/21 0258 09/20/21 0526 09/20/21 1131 09/20/21 1922 09/21/21 0639 09/21/21 0653  NA 123*  --    < > 119*   < > 124*   < > 126* 127* 128* 128*  --  129*  K 3.4*  --   --  4.2  --  3.6  --  3.6 3.6  --  4.6  --  3.9  CL 89*  --   --  87*  --  94*  --  95* 97*  --  98  --  101  CO2 23  --   --  20*  --  21*  --  21* 20*  --  21*  --  18*  GLUCOSE 211*  --   --  257*  --  136*  --  91 87  --  174*  --  99  BUN 10  --   --  14  --  17  --  16 16  --  18  --  18  CREATININE 0.79  --   --  0.82  --  0.84  --  0.85 0.80  --  0.96  --  0.86  CALCIUM 9.5  --   --  9.1  --  9.2  --  9.4 9.1  --  9.8  --  9.4  AST  --   --   --   --   --   --   --  29  --   --   --   --   --   ALT  --   --   --   --   --   --   --  19  --   --   --   --   --   ALKPHOS  --   --   --   --   --   --   --  74  --   --   --   --   --   BILITOT  --   --   --   --   --   --   --  0.9  --   --   --   --   --  ALBUMIN  --   --   --   --   --   --   --  3.3*  --   --   --   --   --   MG 1.3*  --   --  1.6*  --   --   --   --   --   --   --   --  1.6*  TSH  --  >500.000*  --   --   --   --   --   --   --  462.936*  --  428.957*  --    < > = values in this interval not displayed.     ------------------------------------------------------------------------------------------------------------------ No results for input(s): CHOL, HDL, LDLCALC, TRIG, CHOLHDL, LDLDIRECT in the last 72 hours.  Lab Results  Component Value Date   HGBA1C 6.9 (H) 09/13/2021    Recent Labs    09/21/21 0639  TSH 428.957*      Radiology Reports MR ABDOMEN W WO CONTRAST  Result Date: 09/18/2021 CLINICAL DATA:  88 year old female with history of indeterminate lesion noted in the right kidney on prior CT examinations. Follow-up study. EXAM: MRI ABDOMEN WITHOUT AND WITH CONTRAST TECHNIQUE: Multiplanar multisequence MR imaging of the abdomen was performed both before and after the administration of intravenous contrast. CONTRAST:  46mL GADAVIST GADOBUTROL 1 MMOL/ML IV SOLN COMPARISON:  No prior abdominal MRI. CT the abdomen and pelvis 09/12/2021. FINDINGS: Lower chest: Unremarkable. Hepatobiliary: No suspicious cystic or solid hepatic lesions. No intra or extrahepatic biliary ductal dilatation. Gallbladder is normal in appearance. Pancreas: No pancreatic mass. No pancreatic ductal dilatation. No pancreatic or peripancreatic fluid collections or inflammatory changes. Spleen:  Unremarkable. Adrenals/Urinary Tract: In the upper pole of the right kidney, centered predominantly in the region of the right renal collecting system (axial image 21 of series 5 and coronal image 9 of series 4) there is a 2.6 x 3.4 x 2.5 cm lesion which is isointense on T1 weighted images, slightly T2 hypointense, demonstrates low-level heterogeneous enhancement on post gadolinium imaging and diffuse diffusion restriction, with extension into the right renal pelvis (axial image 23 of series 5), highly suspicious for upper tract urothelial neoplasm. Left kidney and bilateral adrenal glands are normal in appearance. No hydroureteronephrosis in the visualized portions of the abdomen. Stomach/Bowel: Visualized portions are unremarkable.  Vascular/Lymphatic: No aneurysm identified in the visualized abdominal vasculature. No lymphadenopathy noted in the abdomen. Other: No significant volume of ascites noted in the visualized portions of the peritoneal cavity. Musculoskeletal: No aggressive appearing osseous lesions are noted in the visualized portions of the skeleton. IMPRESSION: 1. The lesion of concern in the upper pole of the right kidney has imaging characteristics highly concerning for upper tract urothelial neoplasm. Referral to Urology for further clinical evaluation is strongly recommended. 2. No definite lymphadenopathy or signs of metastatic disease in the abdomen. These results will be called to the ordering clinician or representative by the Radiologist Assistant, and communication documented in the PACS or [employer]. Electronically Signed   By: Vinnie Langton M.D.   On: 09/18/2021 08:11

## 2021-09-21 NOTE — Progress Notes (Signed)
Pt. Is noted to be very anxious and jittery. Pt. C/o a headache and reportedly told  family she believes she is dying. Family asking for patient to be given something for anxiety, and also concerned about her blood sugar and sodium. Repeat CBG: 230. Dr. Myna Hidalgo paged for further orders.

## 2021-09-21 NOTE — Progress Notes (Signed)
[facility] Progress Note  Name: [name] MRN: [id number] DOB: Jun 04, 1929  Chief Complaint:  Altered mental status   Subjective:  She was moved to the floor in the interim.  She had 750 mL UOP over 2/11.  She wants to watch the superbowl and asked me about her TV remote.    Review of systems:  Some intermittent shortness of breath denies current chest pain  Denies n/v   No intake or output data in the 24 hours ending 09/21/21 1001   Vitals:  Vitals:   09/20/21 1941 09/21/21 0000 09/21/21 0304 09/21/21 0800  BP: (!) 133/55 (!) 120/47 (!) 100/48 (!) 133/52  Pulse: 72 (!) 57 61 70  Resp: 14 15 18 20   Temp: 97.9 F (36.6 C) 98 F (36.7 C) 98.5 F (36.9 C) 98.1 F (36.7 C)  TempSrc: Axillary Oral Axillary Axillary  SpO2: 100% 99% 100% 99%  Weight:      Height:         Physical Exam:   General elderly female in bed in no acute distress HEENT normocephalic atraumatic extraocular movements intact sclera anicteric Neck supple trachea midline Lungs clear to auscultation bilaterally normal work of breathing at rest  Heart S1S2 no rub Abdomen soft nontender nondistended Extremities no edema  Psych normal mood and affect Neuro - oriented to person, year, location of [facility] and knows superbowl is today - we got her tv and volume situated     Medications reviewed   Labs:  BMP Latest Ref Rng & Units 09/21/2021 09/20/2021 09/20/2021  Glucose 70 - 99 mg/dL 99 174(H) -  BUN 8 - 23 mg/dL 18 18 -  Creatinine 0.44 - 1.00 mg/dL 0.86 0.96 -  Sodium 135 - 145 mmol/L 129(L) 128(L) 128(L)  Potassium 3.5 - 5.1 mmol/L 3.9 4.6 -  Chloride 98 - 111 mmol/L 101 98 -  CO2 22 - 32 mmol/L 18(L) 21(L) -  Calcium 8.9 - 10.3 mg/dL 9.4 9.8 -     Assessment/Plan:   # Symptomatic hyponatremia  - S/p hydration earlier in her course some of which was with dextrose-containing fluids with increased free water.  She has been on HCTZ which was stopped during her hospitalization -  sodium improved further to 129 today  - tomorrow would try gentle fluids.  She went down a bit and stayed at 124 with fluids when ICU team attempted but this was fairly soon after HCTZ was stopped - hypothyroidism per primary team  - I have added HCTZ to allergies list and would not ever resume    # encephalopathy  - felt presently 2/2 hyponatremia.  Initially with hyperglycemia after her glucometer malfunctioned.   - improved    # Gross hematuria  - urology has seen her - they felt secondary to presumable right upper tract urothelial carcinoma.  Urology recommended a large bore foley if she was found to have elevated PVR's - she now has a foley.  No hydro   # Hypothyroidism  - Patient with TSH > 500 on initial check and repeated at 463 and 429 - levothyroxine dose per primary team - they think she wasn't taking and/or underdosed   # Hypokalemia - was likely worse 2/2 HCTZ which is now off.  Now on high dose potassium repletion scheduled  - will reduce to potassium 40 meq daily  # Afib  - hx of, not on anticoagulation    # DM type 2 - per primary team  Disposition - continue inpatient monitoring   Claudia Desanctis, MD 09/21/2021 10:23 AM

## 2021-09-21 NOTE — Evaluation (Signed)
Occupational Therapy Evaluation Patient Details Name: [name] MRN: [id number] DOB: 11-29-1928 Today's Date: 09/21/2021   History of Present Illness 88 y/o female presented to ED on 09/12/21 for confusion and hyperglycemia. CT abdomen showed worsening acute compression fx of L1, worsening of R kidney mass, AKI. Transferred to ICU due to hyponatremia and AMS 2/10. PMH: HTN, DM, hypothyroidism, Afib   Clinical Impression   PTA patient using RW for mobility and independent with ADLs, driving and medication mgmt.  Admitted for above and limited by problem list below, including generalized weakness, impaired balance, decreased activity tolerance and impaired cognition. Pt currently requires min to total assist for ADLs, mod assist for transfers using RW (+2 present for safety).  She is oriented to self, inconsistently follows simple 1 step commands and demonstrates poor awareness and problem solving. Daughter present and provides PLOF/home setup, and confirms cognition is improving from a few days ago.  Based on performance today, patient will benefit from continued OT services while admitted and after dc at SNF level to optimize independence, safety and return to PLOF.  Will follow.      Recommendations for follow up therapy are one component of a multi-disciplinary discharge planning process, led by the attending physician.  Recommendations may be updated based on patient status, additional functional criteria and insurance authorization.   Follow Up Recommendations  Skilled nursing-short term rehab (<3 hours/day)    Assistance Recommended at Discharge Frequent or constant Supervision/Assistance  Patient can return home with the following A lot of help with walking and/or transfers;A lot of help with bathing/dressing/bathroom;Assistance with cooking/housework;Direct supervision/assist for medications management;Direct supervision/assist for financial management;Assist for transportation;Help with  stairs or ramp for entrance    Functional Status Assessment  Patient has had a recent decline in their functional status and demonstrates the ability to make significant improvements in function in a reasonable and predictable amount of time.  Equipment Recommendations  Other (comment) (TBD)    Recommendations for Other Services       Precautions / Restrictions Precautions Precautions: Fall Restrictions Weight Bearing Restrictions: No      Mobility Bed Mobility Overal bed mobility: Needs Assistance Bed Mobility: Supine to Sit     Supine to sit: Mod assist     General bed mobility comments: trunk support and scooting forward    Transfers                          Balance Overall balance assessment: Needs assistance Sitting-balance support: No upper extremity supported, Feet supported Sitting balance-Leahy Scale: Fair     Standing balance support: Bilateral upper extremity supported, During functional activity Standing balance-Leahy Scale: Poor Standing balance comment: relies on BUE support, mild posterior lean                           ADL either performed or assessed with clinical judgement   ADL Overall ADL's : Needs assistance/impaired     Grooming: Sitting;Moderate assistance           Upper Body Dressing : Minimal assistance;Sitting   Lower Body Dressing: Maximal assistance;Sit to/from stand   Toilet Transfer: Moderate assistance;+2 for safety/equipment;Stand-pivot;Rolling walker (2 wheels) Toilet Transfer Details (indicate cue type and reason): simulated to recliner Toileting- Clothing Manipulation and Hygiene: Sit to/from stand;Total assistance;+2 for safety/equipment       Functional mobility during ADLs: Moderate assistance;+2 for safety/equipment;Rolling walker (2 wheels)  Vision   Vision Assessment?: No apparent visual deficits     Perception     Praxis      Pertinent Vitals/Pain Pain Assessment Pain  Assessment: No/denies pain     Hand Dominance Right   Extremity/Trunk Assessment Upper Extremity Assessment Upper Extremity Assessment: Generalized weakness   Lower Extremity Assessment Lower Extremity Assessment: Defer to PT evaluation   Cervical / Trunk Assessment Cervical / Trunk Assessment: Kyphotic   Communication Communication Communication: No difficulties   Cognition Arousal/Alertness: Awake/alert Behavior During Therapy: WFL for tasks assessed/performed Overall Cognitive Status: Impaired/Different from baseline Area of Impairment: Orientation, Following commands, Memory, Safety/judgement, Awareness, Attention, Problem solving                 Orientation Level: Disoriented to, Time, Situation, Place Current Attention Level: Focused Memory: Decreased recall of precautions, Decreased short-term memory Following Commands: Follows one step commands inconsistently, Follows one step commands with increased time Safety/Judgement: Decreased awareness of safety, Decreased awareness of deficits Awareness: Intellectual Problem Solving: Slow processing, Decreased initiation, Difficulty sequencing, Requires verbal cues General Comments: Oriented to self and reports "superbowl sunday" but verbalizes july, and unable to report year without assist from daughter.  patient able to follow simple 1 step commands but inconsistent, slow processing and decreased problem solving.  Incontinent of bowel.     General Comments  daughter present and supportive    Exercises     Shoulder Instructions      Home Living Family/patient expects to be discharged to:: Private residence Living Arrangements: Spouse/significant other Available Help at Discharge: Family Type of Home: House Home Access: Stairs to enter Technical brewer of Steps: 2   Home Layout: One level     Bathroom Shower/Tub: Walk-in shower;Other (comment) (walk in tub)   Bathroom Toilet: Standard     Home  Equipment: Rolling Walker (2 wheels);Grab bars - toilet;Grab bars - tub/shower;Shower seat          Prior Functioning/Environment Prior Level of Function : Independent/Modified Independent             Mobility Comments: uses RW ADLs Comments: driving, independent ADLs, IADLs (meds)        OT Problem List: Decreased strength;Decreased activity tolerance;Impaired balance (sitting and/or standing);Decreased cognition;Decreased safety awareness;Decreased knowledge of use of DME or AE;Decreased knowledge of precautions      OT Treatment/Interventions: Self-care/ADL training;Therapeutic exercise;DME and/or AE instruction;Therapeutic activities;Patient/family education;Balance training;Cognitive remediation/compensation    OT Goals(Current goals can be found in the care plan section) Acute Rehab OT Goals Patient Stated Goal: get better OT Goal Formulation: With patient Time For Goal Achievement: 10/05/21 Potential to Achieve Goals: Good  OT Frequency: Min 2X/week    Co-evaluation              AM-PAC OT "6 Clicks" Daily Activity     Outcome Measure Help from another person eating meals?: A Little Help from another person taking care of personal grooming?: A Lot Help from another person toileting, which includes using toliet, bedpan, or urinal?: Total Help from another person bathing (including washing, rinsing, drying)?: A Lot Help from another person to put on and taking off regular upper body clothing?: A Little Help from another person to put on and taking off regular lower body clothing?: A Lot 6 Click Score: 13   End of Session Equipment Utilized During Treatment: Rolling walker (2 wheels) Nurse Communication: Mobility status  Activity Tolerance: Patient tolerated treatment well Patient left: in chair;with call bell/phone within reach;with chair alarm set;with  nursing/sitter in room;with family/visitor present  OT Visit Diagnosis: Other abnormalities of gait and  mobility (R26.89);Muscle weakness (generalized) (M62.81);Other symptoms and signs involving cognitive function                Time: 3790-2409 OT Time Calculation (min): 29 min Charges:  OT General Charges $OT Visit: 1 Visit OT Evaluation $OT Eval Moderate Complexity: 1 Mod OT Treatments $Self Care/Home Management : 8-22 mins  Jolaine Artist, OT Acute Rehabilitation Services Pager [phone] Office [phone]   [name] 09/21/2021, 1:45 PM

## 2021-09-22 DIAGNOSIS — E1165 Type 2 diabetes mellitus with hyperglycemia: Secondary | ICD-10-CM | POA: Diagnosis not present

## 2021-09-22 LAB — CBC WITH DIFFERENTIAL/PLATELET
Abs Immature Granulocytes: 0.03 10*3/uL (ref 0.00–0.07)
Basophils Absolute: 0 10*3/uL (ref 0.0–0.1)
Basophils Relative: 0 %
Eosinophils Absolute: 0.1 10*3/uL (ref 0.0–0.5)
Eosinophils Relative: 1 %
HCT: 31.3 % — ABNORMAL LOW (ref 36.0–46.0)
Hemoglobin: 11.2 g/dL — ABNORMAL LOW (ref 12.0–15.0)
Immature Granulocytes: 1 %
Lymphocytes Relative: 25 %
Lymphs Abs: 1.6 10*3/uL (ref 0.7–4.0)
MCH: 31.3 pg (ref 26.0–34.0)
MCHC: 35.8 g/dL (ref 30.0–36.0)
MCV: 87.4 fL (ref 80.0–100.0)
Monocytes Absolute: 1.1 10*3/uL — ABNORMAL HIGH (ref 0.1–1.0)
Monocytes Relative: 16 %
Neutro Abs: 3.7 10*3/uL (ref 1.7–7.7)
Neutrophils Relative %: 57 %
Platelets: 244 10*3/uL (ref 150–400)
RBC: 3.58 MIL/uL — ABNORMAL LOW (ref 3.87–5.11)
RDW: 13.2 % (ref 11.5–15.5)
WBC: 6.5 10*3/uL (ref 4.0–10.5)
nRBC: 0 % (ref 0.0–0.2)

## 2021-09-22 LAB — OSMOLALITY, URINE: Osmolality, Ur: 404 mOsm/kg (ref 300–900)

## 2021-09-22 LAB — COMPREHENSIVE METABOLIC PANEL
ALT: 14 U/L (ref 0–44)
AST: 21 U/L (ref 15–41)
Albumin: 3 g/dL — ABNORMAL LOW (ref 3.5–5.0)
Alkaline Phosphatase: 67 U/L (ref 38–126)
Anion gap: 10 (ref 5–15)
BUN: 17 mg/dL (ref 8–23)
CO2: 19 mmol/L — ABNORMAL LOW (ref 22–32)
Calcium: 9.3 mg/dL (ref 8.9–10.3)
Chloride: 94 mmol/L — ABNORMAL LOW (ref 98–111)
Creatinine, Ser: 0.91 mg/dL (ref 0.44–1.00)
GFR, Estimated: 60 mL/min — ABNORMAL LOW (ref >60)
Glucose, Bld: 247 mg/dL — ABNORMAL HIGH (ref 70–99)
Potassium: 3.6 mmol/L (ref 3.5–5.1)
Sodium: 123 mmol/L — ABNORMAL LOW (ref 135–145)
Total Bilirubin: 0.8 mg/dL (ref 0.3–1.2)
Total Protein: 6.4 g/dL — ABNORMAL LOW (ref 6.5–8.1)

## 2021-09-22 LAB — GLUCOSE, CAPILLARY
Glucose-Capillary: 244 mg/dL — ABNORMAL HIGH (ref 70–99)
Glucose-Capillary: 220 mg/dL — ABNORMAL HIGH (ref 70–99)
Glucose-Capillary: 236 mg/dL — ABNORMAL HIGH (ref 70–99)
Glucose-Capillary: 216 mg/dL — ABNORMAL HIGH (ref 70–99)

## 2021-09-22 LAB — T3: T3, Total: 45 ng/dL — ABNORMAL LOW (ref 71–180)

## 2021-09-22 LAB — BRAIN NATRIURETIC PEPTIDE: B Natriuretic Peptide: 143.6 pg/mL — ABNORMAL HIGH (ref 0.0–100.0)

## 2021-09-22 LAB — MAGNESIUM: Magnesium: 1.6 mg/dL — ABNORMAL LOW (ref 1.7–2.4)

## 2021-09-22 LAB — OSMOLALITY: Osmolality: 271 mOsm/kg — ABNORMAL LOW (ref 275–295)

## 2021-09-22 LAB — SODIUM, URINE, RANDOM: Sodium, Ur: 56 mmol/L

## 2021-09-22 MED ORDER — SODIUM CHLORIDE 0.9 % IV SOLN: INTRAVENOUS | Status: DC

## 2021-09-22 MED ORDER — MAGNESIUM SULFATE 4 GM/100ML IV SOLN
4.0000 g | Freq: Once | INTRAVENOUS | Status: AC
  Administered 2021-09-22: 4 g via INTRAVENOUS
  Filled 2021-09-22: qty 100

## 2021-09-22 MED ORDER — LORAZEPAM 0.5 MG PO TABS: 0.2500 mg | ORAL_TABLET | Freq: Once | ORAL | Status: DC | PRN

## 2021-09-22 NOTE — Progress Notes (Signed)
PROGRESS NOTE                                                                                                                                                                                                             Patient Demographics:    [name], is a 88 y.o. female, DOB - 1929-07-13, IFO:[id number]  Outpatient Primary MD for the patient is Binnie Rail, MD    LOS - 9  Admit date - 09/12/2021    No chief complaint on file.      Brief Narrative (HPI from H&P)   History of PAF refused anticoagulation in the past, insulin-dependent type 2 diabetes, hypertension, known right renal mass with intermittent hematuria, followed with urology in [place], recent diagnosis of compression fracture L2 in January 2023 presented with confusion found to have hyperglycemia, report glucometer malfunction, she was also found to have AKI, severe hyponatremia with encephalopathy.  She was stabilized in ICU, Foley catheter was placed was seen by nephrology and then transferred to hospitalist service on 09/21/2021.   Subjective:   Patient in bed, appears comfortable, denies any headache, no fever, no chest pain or pressure, no shortness of breath , no abdominal pain. No new focal weakness.   Assessment  & Plan :    Assessment and Plan:  * Hyperglycemia due to diabetes mellitus ([place]) - nonadherence to home therapy d/t glucometer malfunction, assoc w/ ketosis- (present on admission) DKA has resolved has been placed on Lantus along with sliding scale, question compliance at home.  Counseled.  Diabetic and insulin education.  Monitor CBGs.  Lab Results  Component Value Date   HGBA1C 6.9 (H) 09/13/2021   CBG (last 3)  Recent Labs    09/20/21 1940 09/21/21 0755 09/21/21 1210  GLUCAP 148* 195* 206*     Hyponatremia- (present on admission) Severe with metabolic encephalopathy caused by dehydration from HCTZ, nephrology following,  hyponatremia improving defer management of AKI and hyponatremia to nephrology.  Clinically appears ++ dehydrated. Her encephalopathy seems to have improved and now close to baseline.  AKI (acute kidney injury) ([place]) -IV fluids -trend AM labs  Hypothyroidism- (present on admission) Extremely elevated TSH levels due to noncompliance with Synthroid, counseled, with home dose Synthroid her TSH is serially coming down, surprisingly she does not seem to be in myxedema coma.  Will monitor closely.  Avoiding IV Synthroid due to underlying Afib and relative no Hypothyroid symptoms, TSH coming down.  Hypomagnesemia replaced  Hypokalemia replaced  Compression fracture of L2 ([facility])- (present on admission) -worsening based on imaging, stable on symptoms with no focal deficits.  Outpatient follow-up with neurosurgery postdischarge if desired    Right renal mass Per PCP last note, following w/ urology outpatient, continue follow-up outpatient post discharge Nothing on UA/mico now to concern for UTI/pyelo/abscess  History of Atrial fibrillation ([place]) - currently sinus rhythm- (present on admission) -sinus on EKG in ED, no palpitations   Hypertension- (present on admission) Due to dehydration, appears dehydrated, ACE inhibitor and HCTZ held.  Defer management to nephrology.  Note she had Foley catheter placed in the ICU which will be continued for now till AKI has resolved and nephrology agreeable to removing it.         Condition - Fair  Family Communication  : Daughter Otila Kluver [phone] on 09/22/2021  Code Status :  Full  Consults  :  PCCM, Renal  PUD Prophylaxis :    Procedures  :     MR ABD - 1. The lesion of concern in the upper pole of the right kidney has imaging characteristics highly concerning for upper tract urothelial neoplasm. Referral to Urology for further clinical evaluation is strongly recommended. 2. No definite lymphadenopathy or signs of metastatic disease in the  abdomen.       Disposition Plan  :    Status is: Inpatient   DVT Prophylaxis  :    Place and maintain sequential compression device Start: 09/17/21 1608   Lab Results  Component Value Date   PLT 244 09/22/2021    Diet :  Diet Order             Diet Carb Modified Fluid consistency: Thin; Room service appropriate? Yes  Diet effective now                    Inpatient Medications  Scheduled Meds:  insulin aspart  0-5 Units Subcutaneous QHS   insulin aspart  0-6 Units Subcutaneous TID WC   insulin glargine-yfgn  7 Units Subcutaneous QHS   levothyroxine  112 mcg Oral Q0600   melatonin  3 mg Oral QHS   metoprolol tartrate  25 mg Oral BID   potassium chloride  40 mEq Oral Daily   simvastatin  20 mg Oral QHS   Continuous Infusions:   PRN Meds:.acetaminophen, dextrose, hydrALAZINE, LORazepam, simethicone  Antibiotics  :    Anti-infectives (From admission, onward)    None        Time Spent in minutes  30   Lala Lund M.D on 09/22/2021 at 9:23 AM  To page go to [email or web address]   [facility] -  Office  [phone]  See all Orders from today for further details    Objective:   Vitals:   09/21/21 2241 09/21/21 2356 09/22/21 0305 09/22/21 0815  BP:  (!) 126/51 (!) 111/58 (!) 135/57  Pulse: 64 64 61 61  Resp: 20 20 20 15   Temp:  97.8 F (36.6 C) 97.6 F (36.4 C) 97.7 F (36.5 C)  TempSrc:  Oral Oral Oral  SpO2: 99% 99% 100% 99%  Weight:      Height:        Wt Readings from Last 3 Encounters:  09/17/21 53.2 kg  08/19/21 54.4 kg  08/13/21 54 kg     Intake/Output Summary (Last 24 hours) at 09/22/2021 8676 Last data  filed at 09/22/2021 0308 Gross per 24 hour  Intake 49.16 ml  Output 1050 ml  Net -1000.84 ml     Physical Exam  Awake Alert, No new F.N deficits, Normal affect [place].AT,PERRAL Supple Neck, No JVD,   Symmetrical Chest wall movement, Good air movement bilaterally, CTAB RRR,No Gallops, Rubs or new Murmurs,  +ve  B.Sounds, Abd Soft, No tenderness,   No Cyanosis, No edema, Foley catheter in place with dark - bloody concentrated urine    RN pressure injury documentation: Pressure Injury 09/19/21 Sacrum Mid Stage 1 -  Intact skin with non-blanchable redness of a localized area usually over a bony prominence. (Active)  09/19/21 1600  Location: Sacrum  Location Orientation: Mid  Staging: Stage 1 -  Intact skin with non-blanchable redness of a localized area usually over a bony prominence.  Wound Description (Comments):   Present on Admission:      Data Review:    CBC Recent Labs  Lab 09/18/21 0116 09/20/21 0258 09/22/21 0043  WBC 7.6 6.5 6.5  HGB 11.6* 11.7* 11.2*  HCT 33.4* 33.1* 31.3*  PLT 226 257 244  MCV 86.1 87.1 87.4  MCH 29.9 30.8 31.3  MCHC 34.7 35.3 35.8  RDW 13.2 13.5 13.2  LYMPHSABS  --  1.8 1.6  MONOABS  --  1.1* 1.1*  EOSABS  --  0.1 0.1  BASOSABS  --  0.0 0.0    Electrolytes Recent Labs  Lab 09/18/21 0116 09/18/21 0931 09/18/21 1640 09/19/21 0403 09/19/21 1515 09/20/21 0258 09/20/21 0526 09/20/21 1131 09/20/21 1922 09/21/21 0639 09/21/21 0653 09/22/21 0043  NA 123*  --    < > 119*   < > 126* 127* 128* 128*  --  129* 123*  K 3.4*  --   --  4.2   < > 3.6 3.6  --  4.6  --  3.9 3.6  CL 89*  --   --  87*   < > 95* 97*  --  98  --  101 94*  CO2 23  --   --  20*   < > 21* 20*  --  21*  --  18* 19*  GLUCOSE 211*  --   --  257*   < > 91 87  --  174*  --  99 247*  BUN 10  --   --  14   < > 16 16  --  18  --  18 17  CREATININE 0.79  --   --  0.82   < > 0.85 0.80  --  0.96  --  0.86 0.91  CALCIUM 9.5  --   --  9.1   < > 9.4 9.1  --  9.8  --  9.4 9.3  AST  --   --   --   --   --  29  --   --   --   --   --  21  ALT  --   --   --   --   --  19  --   --   --   --   --  14  ALKPHOS  --   --   --   --   --  74  --   --   --   --   --  67  BILITOT  --   --   --   --   --  0.9  --   --   --   --   --  0.8  ALBUMIN  --   --   --   --   --  3.3*  --   --   --   --   --   3.0*  MG 1.3*  --   --  1.6*  --   --   --   --   --   --  1.6* 1.6*  TSH  --  >500.000*  --   --   --   --   --  462.936*  --  428.957*  --   --   BNP  --   --   --   --   --   --   --   --   --   --   --  143.6*   < > = values in this interval not displayed.    ------------------------------------------------------------------------------------------------------------------ No results for input(s): CHOL, HDL, LDLCALC, TRIG, CHOLHDL, LDLDIRECT in the last 72 hours.  Lab Results  Component Value Date   HGBA1C 6.9 (H) 09/13/2021    Recent Labs    09/21/21 0639  TSH 428.957*      Radiology Reports No results found.

## 2021-09-22 NOTE — Progress Notes (Signed)
Inpatient Diabetes Program Recommendations  AACE/ADA: New Consensus Statement on Inpatient Glycemic Control (2015)  Target Ranges:  Prepandial:   less than 140 mg/dL      Peak postprandial:   less than 180 mg/dL (1-2 hours)      Critically ill patients:  140 - 180 mg/dL   Lab Results  Component Value Date   GLUCAP 220 (H) 09/22/2021   HGBA1C 6.9 (H) 09/13/2021    Review of Glycemic Control  Diabetes history: DM2 Outpatient Diabetes medications: Lantus 14 units QD, Humalog 0-12 units TID Current orders for Inpatient glycemic control: Semglee 7 units QD, Novolog 0-9 units TID + 3 units   Inpatient Diabetes Program Recommendations:    Consider increasing Semglee to 10 units QD  Thanks.  Thank you. Lorenda Peck, RD, LDN, CDE Inpatient Diabetes Coordinator [phone]

## 2021-09-22 NOTE — Progress Notes (Addendum)
[facility] NEPHROLOGY PROGRESS NOTE  Assessment/ Plan: Pt is a 88 y.o. yo female    # Symptomatic hyponatremia  - worsened, Na 123 this morning (down from 129 yesterday) - UOP 1L last 24 hours - try gentle fluids today, NS 75 mL/hr x 12 hours - hypothyroidism per primary team - should never use HCTZ again, this med was placed on her allergy list   # Encephalopathy  - felt presently 2/2 hyponatremia.  Initially with hyperglycemia after her glucometer malfunctioned.   - improved    # Gross hematuria  - urology has seen her; they felt secondary to presumable right upper tract urothelial carcinoma.  Urology recommended a large bore foley if she was found to have elevated PVR's - she now has a foley.  No hydro - follows with urology outpatient, recommend continue follow up after discharge   # Hypothyroidism  - Patient with TSH > 500 on initial check and repeated at 463 and 429 - levothyroxine dose per primary team - they think she wasn't taking and/or under-dosed - question accuracy of TSH measurement while inpatient - recommend PCP recheck in one month after discharge   # Hypokalemia - was likely worse 2/2 HCTZ which is now off.  Now on high dose potassium repletion scheduled  - will reduce to potassium 40 meq daily - K 3.6 this morning   # Afib  - hx of, not on anticoagulation    # DM type 2 - per primary team     Disposition - continue inpatient monitoring    Subjective:   Laying in bed awake. Pleasant affect. Somewhat confused, patient did not think she had a foley catheter in place. No complaints. Dark urine in foley bag. Appears dehydrated.   Objective Vital signs in last 24 hours: Vitals:   09/21/21 2241 09/21/21 2356 09/22/21 0305 09/22/21 0815  BP:  (!) 126/51 (!) 111/58 (!) 135/57  Pulse: 64 64 61 61  Resp: 20 20 20 15   Temp:  97.8 F (36.6 C) 97.6 F (36.4 C) 97.7 F (36.5 C)  TempSrc:  Oral Oral Oral  SpO2: 99% 99% 100% 99%  Weight:       Height:       Weight change:   Intake/Output Summary (Last 24 hours) at 09/22/2021 1122 Last data filed at 09/22/2021 0308 Gross per 24 hour  Intake 49.16 ml  Output 1050 ml  Net -1000.84 ml   Labs: Basic Metabolic Panel: Recent Labs  Lab 09/20/21 1922 09/21/21 0653 09/22/21 0043  NA 128* 129* 123*  K 4.6 3.9 3.6  CL 98 101 94*  CO2 21* 18* 19*  GLUCOSE 174* 99 247*  BUN 18 18 17   CREATININE 0.96 0.86 0.91  CALCIUM 9.8 9.4 9.3  PHOS  --  3.0  --    Liver Function Tests: Recent Labs  Lab 09/20/21 0258 09/22/21 0043  AST 29 21  ALT 19 14  ALKPHOS 74 67  BILITOT 0.9 0.8  PROT 6.9 6.4*  ALBUMIN 3.3* 3.0*   No results for input(s): LIPASE, AMYLASE in the last 168 hours. No results for input(s): AMMONIA in the last 168 hours. CBC: Recent Labs  Lab 09/18/21 0116 09/20/21 0258 09/22/21 0043  WBC 7.6 6.5 6.5  NEUTROABS  --  3.5 3.7  HGB 11.6* 11.7* 11.2*  HCT 33.4* 33.1* 31.3*  MCV 86.1 87.1 87.4  PLT 226 257 244   Cardiac Enzymes: No results for input(s): CKTOTAL, CKMB, CKMBINDEX, TROPONINI in the last  168 hours. CBG: Recent Labs  Lab 09/21/21 1210 09/21/21 1623 09/21/21 1929 09/21/21 2130 09/22/21 0819  GLUCAP 206* 277* 303* 230* 244*    Iron Studies: No results for input(s): IRON, TIBC, TRANSFERRIN, FERRITIN in the last 72 hours. Studies/Results: No results found.  Medications: Infusions:  sodium chloride      Scheduled Medications:  insulin aspart  0-5 Units Subcutaneous QHS   insulin aspart  0-6 Units Subcutaneous TID WC   insulin glargine-yfgn  7 Units Subcutaneous QHS   levothyroxine  112 mcg Oral Q0600   melatonin  3 mg Oral QHS   metoprolol tartrate  25 mg Oral BID   potassium chloride  40 mEq Oral Daily   simvastatin  20 mg Oral QHS    have reviewed scheduled and prn medications.  Physical Exam: General:NAD, comfortable Heart:RRR, s1s2 nl Lungs:clear b/l, no crackle Abdomen:soft, Non-tender, non-distended Extremities:No  edema Dialysis Access: none   Ezequiel Essex, MD Family Medicine PGY-2 Nephrology Service 09/22/2021,11:22 AM  LOS: 9 days

## 2021-09-22 NOTE — Progress Notes (Signed)
Physical Therapy Treatment Patient Details Name: [name] MRN: [id number] DOB: 11/12/1928 Today's Date: 09/22/2021   History of Present Illness Pt is a 88 y.o. female admitted 09/12/21 for confusion and hyperglycemia. CT abdomen showed worsening acute compression fx of L1, worsening of R kidney mass, AKI. Transferred to ICU due to hyponatremia and AMS 2/10. PMH includes HTN, DM, hypothyroidism, Afib.   PT Comments    Pt progressing with mobility. Today's session focused on transfer training with RW; pt requires step-by-step cues for sequencing, modA for standing activity. Pt pleasantly confused remains limited by generalized weakness, decreased activity tolerance, poor balance strategies/postural reactions and impaired cognition. Continue to recommend SNF-level therapies to maximize functional mobility and independence prior to return home.    Recommendations for follow up therapy are one component of a multi-disciplinary discharge planning process, led by the attending physician.  Recommendations may be updated based on patient status, additional functional criteria and insurance authorization.  Follow Up Recommendations  Skilled nursing-short term rehab (<3 hours/day)     Assistance Recommended at Discharge Intermittent Supervision/Assistance  Patient can return home with the following A lot of help with walking and/or transfers;A little help with bathing/dressing/bathroom;Assistance with cooking/housework;Help with stairs or ramp for entrance   Equipment Recommendations  Rolling walker (2 wheels);BSC/3in1;Wheelchair (measurements PT);Wheelchair cushion (measurements PT)    Recommendations for Other Services       Precautions / Restrictions Precautions Precautions: Fall Restrictions Weight Bearing Restrictions: No     Mobility  Bed Mobility Overal bed mobility: Needs Assistance Bed Mobility: Supine to Sit     Supine to sit: Mod assist, HOB elevated     General bed  mobility comments: pt initiating movement well, modA for trunk elevation, pt able to scoot self to bed with cues    Transfers Overall transfer level: Needs assistance   Transfers: Sit to/from Stand, Bed to chair/wheelchair/BSC     Step pivot transfers: Mod assist       General transfer comment: Multiple sit<>stands and pivotal steps between bed>recliner>BSC>recliner, consistent modA for trunk elevation, cues for sequencing, modA for stability and RW management with steps    Ambulation/Gait               General Gait Details: limited by fatigue after multiple step pivot transfers   Stairs             Wheelchair Mobility    Modified Rankin (Stroke Patients Only)       Balance Overall balance assessment: Needs assistance Sitting-balance support: No upper extremity supported, Feet supported Sitting balance-Leahy Scale: Fair     Standing balance support: Bilateral upper extremity supported, During functional activity Standing balance-Leahy Scale: Poor Standing balance comment: relies on BUE support, mild posterior lean, assist for posterior pericare                            Cognition Arousal/Alertness: Awake/alert Behavior During Therapy: WFL for tasks assessed/performed Overall Cognitive Status: Impaired/Different from baseline Area of Impairment: Orientation, Following commands, Memory, Safety/judgement, Awareness, Attention, Problem solving                 Orientation Level: Disoriented to, Time, Situation, Place Current Attention Level: Focused Memory: Decreased recall of precautions, Decreased short-term memory Following Commands: Follows one step commands with increased time Safety/Judgement: Decreased awareness of safety, Decreased awareness of deficits Awareness: Intellectual Problem Solving: Slow processing, Decreased initiation, Difficulty sequencing, Requires verbal cues General Comments: pt pleasantly confused;  following  majority of simple commands with frequent repetition        Exercises      General Comments General comments (skin integrity, edema, etc.): SpO2 99% on RA      Pertinent Vitals/Pain Pain Assessment Pain Assessment: No/denies pain Pain Intervention(s): Monitored during session    Home Living                          Prior Function            PT Goals (current goals can now be found in the care plan section) Progress towards PT goals: Progressing toward goals    Frequency    Min 2X/week      PT Plan Current plan remains appropriate    Co-evaluation              AM-PAC PT "6 Clicks" Mobility   Outcome Measure  Help needed turning from your back to your side while in a flat bed without using bedrails?: A Little Help needed moving from lying on your back to sitting on the side of a flat bed without using bedrails?: A Lot Help needed moving to and from a bed to a chair (including a wheelchair)?: A Lot Help needed standing up from a chair using your arms (e.g., wheelchair or bedside chair)?: A Lot Help needed to walk in hospital room?: Total Help needed climbing 3-5 steps with a railing? : Total 6 Click Score: 11    End of Session Equipment Utilized During Treatment: Gait belt Activity Tolerance: Patient tolerated treatment well;Patient limited by fatigue Patient left: in chair;with call bell/phone within reach;with chair alarm set Nurse Communication: Mobility status PT Visit Diagnosis: Difficulty in walking, not elsewhere classified (R26.2);Unsteadiness on feet (R26.81);Muscle weakness (generalized) (M62.81)     Time: 2500-3704 PT Time Calculation (min) (ACUTE ONLY): 25 min  Charges:  $Therapeutic Activity: 23-37 mins                     [name], PT, DPT Acute Rehabilitation Services  Pager [phone] Office [phone]  [name] 09/22/2021, 12:31 PM

## 2021-09-23 LAB — URINALYSIS, ROUTINE W REFLEX MICROSCOPIC
Bilirubin Urine: NEGATIVE
Glucose, UA: 150 mg/dL — AB
Ketones, ur: NEGATIVE mg/dL
Nitrite: NEGATIVE
Protein, ur: 100 mg/dL — AB
RBC / HPF: >50 RBC/hpf — ABNORMAL HIGH (ref 0–5)
Specific Gravity, Urine: 1.008 (ref 1.005–1.030)
WBC, UA: >50 WBC/hpf — ABNORMAL HIGH (ref 0–5)
pH: 5 (ref 5.0–8.0)

## 2021-09-23 LAB — CBC WITH DIFFERENTIAL/PLATELET
Abs Immature Granulocytes: 0.02 10*3/uL (ref 0.00–0.07)
Basophils Absolute: 0 10*3/uL (ref 0.0–0.1)
Basophils Relative: 0 %
Eosinophils Absolute: 0.1 10*3/uL (ref 0.0–0.5)
Eosinophils Relative: 1 %
HCT: 29.3 % — ABNORMAL LOW (ref 36.0–46.0)
Hemoglobin: 10.5 g/dL — ABNORMAL LOW (ref 12.0–15.0)
Immature Granulocytes: 0 %
Lymphocytes Relative: 26 %
Lymphs Abs: 1.8 10*3/uL (ref 0.7–4.0)
MCH: 31.3 pg (ref 26.0–34.0)
MCHC: 35.8 g/dL (ref 30.0–36.0)
MCV: 87.2 fL (ref 80.0–100.0)
Monocytes Absolute: 0.9 10*3/uL (ref 0.1–1.0)
Monocytes Relative: 14 %
Neutro Abs: 3.8 10*3/uL (ref 1.7–7.7)
Neutrophils Relative %: 59 %
Platelets: 276 10*3/uL (ref 150–400)
RBC: 3.36 MIL/uL — ABNORMAL LOW (ref 3.87–5.11)
RDW: 13.6 % (ref 11.5–15.5)
WBC: 6.6 10*3/uL (ref 4.0–10.5)
nRBC: 0 % (ref 0.0–0.2)

## 2021-09-23 LAB — COMPREHENSIVE METABOLIC PANEL
ALT: 14 U/L (ref 0–44)
AST: 17 U/L (ref 15–41)
Albumin: 2.9 g/dL — ABNORMAL LOW (ref 3.5–5.0)
Alkaline Phosphatase: 65 U/L (ref 38–126)
Anion gap: 8 (ref 5–15)
BUN: 13 mg/dL (ref 8–23)
CO2: 20 mmol/L — ABNORMAL LOW (ref 22–32)
Calcium: 8.9 mg/dL (ref 8.9–10.3)
Chloride: 98 mmol/L (ref 98–111)
Creatinine, Ser: 0.88 mg/dL (ref 0.44–1.00)
GFR, Estimated: >60 mL/min (ref >60)
Glucose, Bld: 212 mg/dL — ABNORMAL HIGH (ref 70–99)
Potassium: 3.5 mmol/L (ref 3.5–5.1)
Sodium: 126 mmol/L — ABNORMAL LOW (ref 135–145)
Total Bilirubin: 0.5 mg/dL (ref 0.3–1.2)
Total Protein: 5.9 g/dL — ABNORMAL LOW (ref 6.5–8.1)

## 2021-09-23 LAB — GLUCOSE, CAPILLARY
Glucose-Capillary: 155 mg/dL — ABNORMAL HIGH (ref 70–99)
Glucose-Capillary: 280 mg/dL — ABNORMAL HIGH (ref 70–99)
Glucose-Capillary: 249 mg/dL — ABNORMAL HIGH (ref 70–99)
Glucose-Capillary: 218 mg/dL — ABNORMAL HIGH (ref 70–99)

## 2021-09-23 LAB — MAGNESIUM: Magnesium: 2 mg/dL (ref 1.7–2.4)

## 2021-09-23 LAB — BRAIN NATRIURETIC PEPTIDE: B Natriuretic Peptide: 100 pg/mL (ref 0.0–100.0)

## 2021-09-23 LAB — TSH: TSH: 379.843 u[IU]/mL — ABNORMAL HIGH (ref 0.350–4.500)

## 2021-09-23 MED ORDER — SODIUM CHLORIDE 1 G PO TABS
1.0000 g | ORAL_TABLET | Freq: Three times a day (TID) | ORAL | Status: DC
  Administered 2021-09-23 – 2021-09-24 (×3): 1 g via ORAL
  Filled 2021-09-23 (×4): qty 1

## 2021-09-23 MED ORDER — SODIUM CHLORIDE 0.9 % IV SOLN: INTRAVENOUS | Status: AC

## 2021-09-23 NOTE — Progress Notes (Addendum)
Occupational Therapy Treatment Patient Details Name: [name] MRN: [id number] DOB: May 23, 1929 Today's Date: 09/23/2021   History of present illness Pt is a 88 y.o. female admitted 09/12/21 for confusion and hyperglycemia. CT abdomen showed worsening acute compression fx of L1, worsening of R kidney mass, AKI. Transferred to ICU due to hyponatremia and AMS 2/10. PMH includes HTN, DM, hypothyroidism, Afib.   OT comments  Pt supine in bed and eager to participate in OT.  Remains pleasantly confused, but oriented x 4 and following simple 1 step commands consistently today.  She requires cueing for safety, problem solving, and awareness throughout session.  Completes transfers with min assist using RW, setup for grooming and eating.  Will follow acutely.    Recommendations for follow up therapy are one component of a multi-disciplinary discharge planning process, led by the attending physician.  Recommendations may be updated based on patient status, additional functional criteria and insurance authorization.    Follow Up Recommendations  Skilled nursing-short term rehab (<3 hours/day)    Assistance Recommended at Discharge    Patient can return home with the following  A little help with walking and/or transfers;A lot of help with bathing/dressing/bathroom;Assistance with cooking/housework;Direct supervision/assist for medications management;Direct supervision/assist for financial management;Assist for transportation;Help with stairs or ramp for entrance   Equipment Recommendations  BSC/3in1    Recommendations for Other Services      Precautions / Restrictions Precautions Precautions: Fall Restrictions Weight Bearing Restrictions: No       Mobility Bed Mobility Overal bed mobility: Needs Assistance Bed Mobility: Supine to Sit     Supine to sit: Min guard     General bed mobility comments: HOB elevated, min guard for trunk support but increased time and min verbal cues     Transfers                         Balance Overall balance assessment: Needs assistance Sitting-balance support: No upper extremity supported, Feet supported Sitting balance-Leahy Scale: Fair     Standing balance support: Bilateral upper extremity supported, During functional activity Standing balance-Leahy Scale: Poor Standing balance comment: relies on BUE and exteral support                           ADL either performed or assessed with clinical judgement   ADL Overall ADL's : Needs assistance/impaired     Grooming: Wash/dry hands;Wash/dry face;Set up;Sitting                   Toilet Transfer: Minimal assistance;Stand-pivot;Rolling walker (2 wheels);BSC/3in1           Functional mobility during ADLs: Minimal assistance;Rolling walker (2 wheels);Cueing for safety;Cueing for sequencing General ADL Comments: pt limited by impaired cognition, balance and weakness    Extremity/Trunk Assessment              Vision       Perception     Praxis      Cognition Arousal/Alertness: Awake/alert Behavior During Therapy: WFL for tasks assessed/performed Overall Cognitive Status: Impaired/Different from baseline Area of Impairment: Attention, Following commands, Safety/judgement, Problem solving, Awareness, Memory                   Current Attention Level: Sustained Memory: Decreased short-term memory, Decreased recall of precautions Following Commands: Follows one step commands consistently, Follows one step commands with increased time Safety/Judgement: Decreased awareness of safety, Decreased awareness of  deficits Awareness: Emergent Problem Solving: Slow processing, Decreased initiation, Difficulty sequencing, Requires verbal cues, Requires tactile cues General Comments: pt pleasantly confused, follows simple 1 step commands but poor attention, seqeucning and problem sovling.  Voicing "I can just go to the bathroom here right?"-  speaking of recliner        Exercises      Shoulder Instructions       General Comments VSS on RA    Pertinent Vitals/ Pain       Pain Assessment Pain Assessment: No/denies pain  Home Living                                          Prior Functioning/Environment              Frequency  Min 2X/week        Progress Toward Goals  OT Goals(current goals can now be found in the care plan section)  Progress towards OT goals: Progressing toward goals  Acute Rehab OT Goals Patient Stated Goal: get home OT Goal Formulation: With patient Time For Goal Achievement: 10/05/21 Potential to Achieve Goals: Good  Plan Discharge plan remains appropriate;Frequency remains appropriate    Co-evaluation                 AM-PAC OT "6 Clicks" Daily Activity     Outcome Measure   Help from another person eating meals?: A Little Help from another person taking care of personal grooming?: A Little Help from another person toileting, which includes using toliet, bedpan, or urinal?: A Lot Help from another person bathing (including washing, rinsing, drying)?: A Lot Help from another person to put on and taking off regular upper body clothing?: A Little Help from another person to put on and taking off regular lower body clothing?: A Lot 6 Click Score: 15    End of Session Equipment Utilized During Treatment: Rolling walker (2 wheels);Gait belt  OT Visit Diagnosis: Other abnormalities of gait and mobility (R26.89);Muscle weakness (generalized) (M62.81);Other symptoms and signs involving cognitive function   Activity Tolerance Patient tolerated treatment well   Patient Left in chair;with call bell/phone within reach;with chair alarm set   Nurse Communication Mobility status        Time: 5374-8270 OT Time Calculation (min): 19 min  Charges: OT General Charges $OT Visit: 1 Visit OT Treatments $Self Care/Home Management : 8-22 mins  Jolaine Artist,  OT [place] Pager [phone] Office Ali Chukson 09/23/2021, 9:19 AM

## 2021-09-23 NOTE — Progress Notes (Addendum)
[facility] NEPHROLOGY PROGRESS NOTE  Assessment/ Plan: Pt is a 88 y.o. yo female    # Symptomatic hyponatremia  Patient still appears dehydrated, although improved from yesterday. Somewhat improved mentation and brighter affect than yesterday. Appetite increased.  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  Urology recommended a large bore foley if she was found to have elevated PVR's - she now has a foley.  No hydro - follows with urology outpatient, recommend continue follow up after discharge   # Hypothyroidism  - Patient with TSH > 500 on initial check and repeated at 463 and 429 - levothyroxine dose per primary team - they think she wasn't taking and/or under-dosed - recommend PCP recheck in one month after discharge   # Hypokalemia - K normal this AM - scheduled for 40 mEq PO daily   # Afib  - hx of, not on anticoagulation    # DM type 2 - per primary team     Disposition - continue inpatient monitoring    Subjective:   Patient sitting up in chair at bedside. Reports urgent need for beside commode to have BM, helped her to commode. No complaints. Reports improved appetite, ate whole breakfast.  Objective Vital signs in last 24 hours: Vitals:   09/23/21 0011 09/23/21 0455 09/23/21 0816 09/23/21 0828  BP: (!) 146/58 (!) 147/63 (!) 165/58 (!) 152/67  Pulse: 65 64 69   Resp: 16 17    Temp: 98 F (36.7 C) (!) 97.5 F (36.4 C)  98 F (36.7 C)  TempSrc: Oral Oral  Oral  SpO2: 99% 99%     Weight:      Height:       Weight change:   Intake/Output Summary (Last 24 hours) at 09/23/2021 1035 Last data filed at 09/23/2021 8546 Gross per 24 hour  Intake 895 ml  Output 1275 ml  Net -380 ml   Labs: Basic Metabolic Panel: Recent Labs  Lab 09/21/21 0653 09/22/21 0043 09/23/21 0126  NA 129* 123* 126*  K 3.9 3.6 3.5  CL 101 94* 98  CO2 18* 19* 20*  GLUCOSE 99 247* 212*  BUN 18 17 13   CREATININE 0.86 0.91 0.88  CALCIUM 9.4 9.3 8.9  PHOS 3.0  --   --    Liver Function Tests: Recent Labs  Lab 09/20/21 0258 09/22/21 0043 09/23/21 0126  AST 29 21 17   ALT 19 14 14   ALKPHOS 74 67 65  BILITOT 0.9 0.8 0.5  PROT 6.9 6.4* 5.9*  ALBUMIN 3.3* 3.0* 2.9*   No results for input(s): LIPASE, AMYLASE in the last 168 hours. No results for input(s): AMMONIA in the last 168 hours. CBC: Recent Labs  Lab 09/18/21 0116 09/20/21 0258 09/22/21 0043 09/23/21 0126  WBC 7.6 6.5 6.5 6.6  NEUTROABS  --  3.5 3.7 3.8  HGB 11.6* 11.7* 11.2* 10.5*  HCT 33.4* 33.1* 31.3* 29.3*  MCV 86.1 87.1 87.4 87.2  PLT 226  257 244 276   Cardiac Enzymes: No results for input(s): CKTOTAL, CKMB, CKMBINDEX, TROPONINI in the last 168 hours. CBG: Recent Labs  Lab 09/22/21 0819 09/22/21 1213 09/22/21 1607 09/22/21 2057 09/23/21 0815  GLUCAP 244* 220* 236* 216* 155*    Iron Studies: No results for input(s): IRON, TIBC, TRANSFERRIN, FERRITIN in the last 72 hours. Studies/Results: No results found.  Medications: Infusions:  sodium chloride 75 mL/hr at 09/22/21 2232    Scheduled Medications:  insulin aspart  0-5 Units Subcutaneous QHS   insulin aspart  0-6 Units Subcutaneous TID WC   insulin glargine-yfgn  7 Units Subcutaneous QHS   levothyroxine  112 mcg Oral Q0600   melatonin  3 mg Oral QHS   metoprolol tartrate  25 mg Oral BID   potassium chloride  40 mEq Oral Daily   simvastatin  20 mg Oral QHS   sodium chloride  1 g Oral TID WC    have reviewed scheduled and prn  medications.  Physical Exam: General:NAD, comfortable Abdomen:soft, Non-tender, non-distended Extremities:No edema Dialysis Access: none   Ezequiel Essex 09/23/2021,10:35 AM  LOS: 10 days

## 2021-09-23 NOTE — Progress Notes (Signed)
Patient complains of urinary frequency and burning. Patient does have foul smelling blood tinged urine. Attending page via [place].

## 2021-09-23 NOTE — TOC Progression Note (Signed)
Transition of Care [facility]) - Progression Note    Patient Details  Name: [name] MRN: [id number] Date of Birth: 10/19/1928  Transition of [facility]) CM/SW [place], LCSW Phone Number: 09/23/2021, 8:50 AM  Clinical Narrative:    CSW submitted again for insurance approval for [employer], Ref# [id number].    Expected Discharge Plan: Skilled Nursing Facility Barriers to Discharge: Ship broker, SNF Pending bed offer  Expected Discharge Plan and Services Expected Discharge Plan: [place] arrangements for the past 2 months: Single Family Home                                       Social Determinants of Health (SDOH) Interventions    Readmission Risk Interventions No flowsheet data found.

## 2021-09-23 NOTE — Progress Notes (Signed)
PROGRESS NOTE                                                                                                                                                                                                             Patient Demographics:    [name], is a 88 y.o. female, DOB - [birth year], UYQ:[id number]  Outpatient Primary MD for the patient is Binnie Rail, MD    LOS - 10  Admit date - 09/12/2021    No chief complaint on file.      Brief Narrative (HPI from H&P)   History of PAF refused anticoagulation in the past, insulin-dependent type 2 diabetes, hypertension, known right renal mass with intermittent hematuria, followed with urology in [place], recent diagnosis of compression fracture L2 in January 2023 presented with confusion found to have hyperglycemia, report glucometer malfunction, she was also found to have AKI, severe hyponatremia with encephalopathy.  She was stabilized in ICU, Foley catheter was placed was seen by nephrology and then transferred to hospitalist service on 09/21/2021.   Subjective:   Patient in bed, appears comfortable, denies any headache, no fever, no chest pain or pressure, no shortness of breath , no abdominal pain. No new focal weakness.    Assessment  & Plan :    Assessment and Plan:  * Hyperglycemia due to diabetes mellitus ([place]) - nonadherence to home therapy d/t glucometer malfunction, assoc w/ ketosis- (present on admission) DKA has resolved has been placed on Lantus along with sliding scale, question compliance at home.  Counseled.  Diabetic and insulin education.  Monitor CBGs.  Lab Results  Component Value Date   HGBA1C 6.9 (H) 09/13/2021   CBG (last 3)  Recent Labs    09/20/21 1940 09/21/21 0755 09/21/21 1210  GLUCAP 148* 195* 206*     Hyponatremia- (present on admission) Severe with metabolic encephalopathy caused by dehydration from HCTZ, nephrology  following, clinically appeared extremely hydrated, received IV fluids on 09/22/2021 with improvement in sodium levels, case discussed with nephrology on 09/23/2021, added salt tablets, continue to monitor. Her encephalopathy seems to have improved and now close to baseline.  Remove Foley catheter on 09/23/2021.  AKI (acute kidney injury) ([place]) -IV fluids -trend AM labs  Hypothyroidism- (present on admission) Extremely elevated TSH levels due to noncompliance with Synthroid, counseled, with home dose Synthroid her TSH  is serially coming down, surprisingly she does not seem to be in myxedema coma.  Will monitor closely. Avoiding IV Synthroid due to underlying Afib and relative no Hypothyroid symptoms, TSH coming down.  Hypomagnesemia replaced  Hypokalemia replaced  Compression fracture of L2 ([facility])- (present on admission) -worsening based on imaging, stable on symptoms with no focal deficits.  Outpatient follow-up with neurosurgery postdischarge if desired    Right renal mass Per PCP last note, following w/ urology outpatient, continue follow-up outpatient post discharge Nothing on UA/mico now to concern for UTI/pyelo/abscess  History of Atrial fibrillation ([place]) - currently sinus rhythm- (present on admission) -sinus on EKG in ED, no palpitations   Hypertension- (present on admission) Due to dehydration, appears dehydrated, ACE inhibitor and HCTZ held.  Defer management to nephrology.            Condition - Fair  Family Communication  : Daughter Otila Kluver [phone] on 09/22/2021  Code Status :  Full  Consults  :  PCCM, Renal  PUD Prophylaxis :    Procedures  :     MR ABD - 1. The lesion of concern in the upper pole of the right kidney has imaging characteristics highly concerning for upper tract urothelial neoplasm. Referral to Urology for further clinical evaluation is strongly recommended. 2. No definite lymphadenopathy or signs of metastatic disease in the abdomen.        Disposition Plan  :    Status is: Inpatient   DVT Prophylaxis  :    Place and maintain sequential compression device Start: 09/17/21 1608   Lab Results  Component Value Date   PLT 276 09/23/2021    Diet :  Diet Order             Diet Carb Modified Fluid consistency: Thin; Room service appropriate? Yes  Diet effective now                    Inpatient Medications  Scheduled Meds:  insulin aspart  0-5 Units Subcutaneous QHS   insulin aspart  0-6 Units Subcutaneous TID WC   insulin glargine-yfgn  7 Units Subcutaneous QHS   levothyroxine  112 mcg Oral Q0600   melatonin  3 mg Oral QHS   metoprolol tartrate  25 mg Oral BID   potassium chloride  40 mEq Oral Daily   simvastatin  20 mg Oral QHS   sodium chloride  1 g Oral TID WC   Continuous Infusions:  sodium chloride 50 mL/hr at 09/23/21 1121    PRN Meds:.acetaminophen, hydrALAZINE, LORazepam, simethicone  Antibiotics  :    Anti-infectives (From admission, onward)    None        Time Spent in minutes  30   Lala Lund M.D on 09/23/2021 at 1:33 PM  To page go to [email or web address]   [facility] -  Office  [phone]  See all Orders from today for further details    Objective:   Vitals:   09/23/21 0816 09/23/21 0828 09/23/21 1200 09/23/21 1201  BP: (!) 165/58 (!) 152/67 (!) 142/65 (!) 142/65  Pulse: 69   63  Resp:      Temp:  98 F (36.7 C)  97.9 F (36.6 C)  TempSrc:  Oral  Oral  SpO2:    98%  Weight:      Height:        Wt Readings from Last 3 Encounters:  09/17/21 53.2 kg  08/19/21 54.4 kg  08/13/21 54 kg  Intake/Output Summary (Last 24 hours) at 09/23/2021 1333 Last data filed at 09/23/2021 9373 Gross per 24 hour  Intake 655 ml  Output 800 ml  Net -145 ml     Physical Exam  Awake Alert, No new F.N deficits, Normal affect [place].AT,PERRAL Supple Neck, No JVD,   Symmetrical Chest wall movement, Good air movement bilaterally, CTAB RRR,No Gallops, Rubs or new  Murmurs,  +ve B.Sounds, Abd Soft, No tenderness,   No Cyanosis, light-colored urine in Foley bag on 09/23/2021     RN pressure injury documentation: Pressure Injury 09/19/21 Sacrum Mid Stage 1 -  Intact skin with non-blanchable redness of a localized area usually over a bony prominence. (Active)  09/19/21 1600  Location: Sacrum  Location Orientation: Mid  Staging: Stage 1 -  Intact skin with non-blanchable redness of a localized area usually over a bony prominence.  Wound Description (Comments):   Present on Admission:      Data Review:    CBC Recent Labs  Lab 09/18/21 0116 09/20/21 0258 09/22/21 0043 09/23/21 0126  WBC 7.6 6.5 6.5 6.6  HGB 11.6* 11.7* 11.2* 10.5*  HCT 33.4* 33.1* 31.3* 29.3*  PLT 226 257 244 276  MCV 86.1 87.1 87.4 87.2  MCH 29.9 30.8 31.3 31.3  MCHC 34.7 35.3 35.8 35.8  RDW 13.2 13.5 13.2 13.6  LYMPHSABS  --  1.8 1.6 1.8  MONOABS  --  1.1* 1.1* 0.9  EOSABS  --  0.1 0.1 0.1  BASOSABS  --  0.0 0.0 0.0    Electrolytes Recent Labs  Lab 09/18/21 0116 09/18/21 0931 09/18/21 1640 09/19/21 0403 09/19/21 1515 09/20/21 0258 09/20/21 0526 09/20/21 1131 09/20/21 1922 09/21/21 0639 09/21/21 0653 09/22/21 0043 09/23/21 0126  NA 123*  --    < > 119*   < > 126* 127* 128* 128*  --  129* 123* 126*  K 3.4*  --   --  4.2   < > 3.6 3.6  --  4.6  --  3.9 3.6 3.5  CL 89*  --   --  87*   < > 95* 97*  --  98  --  101 94* 98  CO2 23  --   --  20*   < > 21* 20*  --  21*  --  18* 19* 20*  GLUCOSE 211*  --   --  257*   < > 91 87  --  174*  --  99 247* 212*  BUN 10  --   --  14   < > 16 16  --  18  --  18 17 13   CREATININE 0.79  --   --  0.82   < > 0.85 0.80  --  0.96  --  0.86 0.91 0.88  CALCIUM 9.5  --   --  9.1   < > 9.4 9.1  --  9.8  --  9.4 9.3 8.9  AST  --   --   --   --   --  29  --   --   --   --   --  21 17  ALT  --   --   --   --   --  19  --   --   --   --   --  14 14  ALKPHOS  --   --   --   --   --  74  --   --   --   --   --  67 65  BILITOT  --    --   --   --   --  0.9  --   --   --   --   --  0.8 0.5  ALBUMIN  --   --   --   --   --  3.3*  --   --   --   --   --  3.0* 2.9*  MG 1.3*  --   --  1.6*  --   --   --   --   --   --  1.6* 1.6* 2.0  TSH  --  >500.000*  --   --   --   --   --  462.936*  --  428.957*  --   --  379.843*  BNP  --   --   --   --   --   --   --   --   --   --   --  143.6* 100.0   < > = values in this interval not displayed.    ------------------------------------------------------------------------------------------------------------------ No results for input(s): CHOL, HDL, LDLCALC, TRIG, CHOLHDL, LDLDIRECT in the last 72 hours.  Lab Results  Component Value Date   HGBA1C 6.9 (H) 09/13/2021    Recent Labs    09/23/21 0126  TSH 379.843*      Radiology Reports No results found.

## 2021-09-24 DIAGNOSIS — N179 Acute kidney failure, unspecified: Secondary | ICD-10-CM | POA: Diagnosis not present

## 2021-09-24 DIAGNOSIS — E1151 Type 2 diabetes mellitus with diabetic peripheral angiopathy without gangrene: Secondary | ICD-10-CM | POA: Diagnosis not present

## 2021-09-24 DIAGNOSIS — Z794 Long term (current) use of insulin: Secondary | ICD-10-CM | POA: Diagnosis not present

## 2021-09-24 DIAGNOSIS — E1129 Type 2 diabetes mellitus with other diabetic kidney complication: Secondary | ICD-10-CM | POA: Diagnosis not present

## 2021-09-24 DIAGNOSIS — R059 Cough, unspecified: Secondary | ICD-10-CM | POA: Diagnosis not present

## 2021-09-24 DIAGNOSIS — E1010 Type 1 diabetes mellitus with ketoacidosis without coma: Secondary | ICD-10-CM | POA: Diagnosis not present

## 2021-09-24 DIAGNOSIS — R531 Weakness: Secondary | ICD-10-CM | POA: Diagnosis not present

## 2021-09-24 DIAGNOSIS — G9340 Encephalopathy, unspecified: Secondary | ICD-10-CM | POA: Diagnosis not present

## 2021-09-24 DIAGNOSIS — I119 Hypertensive heart disease without heart failure: Secondary | ICD-10-CM | POA: Diagnosis not present

## 2021-09-24 DIAGNOSIS — M810 Age-related osteoporosis without current pathological fracture: Secondary | ICD-10-CM | POA: Diagnosis not present

## 2021-09-24 DIAGNOSIS — E039 Hypothyroidism, unspecified: Secondary | ICD-10-CM | POA: Diagnosis not present

## 2021-09-24 DIAGNOSIS — R262 Difficulty in walking, not elsewhere classified: Secondary | ICD-10-CM | POA: Diagnosis not present

## 2021-09-24 DIAGNOSIS — E1165 Type 2 diabetes mellitus with hyperglycemia: Secondary | ICD-10-CM | POA: Diagnosis not present

## 2021-09-24 DIAGNOSIS — R310 Gross hematuria: Secondary | ICD-10-CM | POA: Diagnosis not present

## 2021-09-24 DIAGNOSIS — Z7401 Bed confinement status: Secondary | ICD-10-CM | POA: Diagnosis not present

## 2021-09-24 DIAGNOSIS — E871 Hypo-osmolality and hyponatremia: Secondary | ICD-10-CM | POA: Diagnosis not present

## 2021-09-24 DIAGNOSIS — I10 Essential (primary) hypertension: Secondary | ICD-10-CM | POA: Diagnosis not present

## 2021-09-24 LAB — CBC WITH DIFFERENTIAL/PLATELET
Abs Immature Granulocytes: 0.03 10*3/uL (ref 0.00–0.07)
Basophils Absolute: 0 10*3/uL (ref 0.0–0.1)
Basophils Relative: 0 %
Eosinophils Absolute: 0.1 10*3/uL (ref 0.0–0.5)
Eosinophils Relative: 1 %
HCT: 32.5 % — ABNORMAL LOW (ref 36.0–46.0)
Hemoglobin: 11.3 g/dL — ABNORMAL LOW (ref 12.0–15.0)
Immature Granulocytes: 0 %
Lymphocytes Relative: 17 %
Lymphs Abs: 1.6 10*3/uL (ref 0.7–4.0)
MCH: 30.7 pg (ref 26.0–34.0)
MCHC: 34.8 g/dL (ref 30.0–36.0)
MCV: 88.3 fL (ref 80.0–100.0)
Monocytes Absolute: 1.1 10*3/uL — ABNORMAL HIGH (ref 0.1–1.0)
Monocytes Relative: 11 %
Neutro Abs: 6.8 10*3/uL (ref 1.7–7.7)
Neutrophils Relative %: 71 %
Platelets: 279 10*3/uL (ref 150–400)
RBC: 3.68 MIL/uL — ABNORMAL LOW (ref 3.87–5.11)
RDW: 13.5 % (ref 11.5–15.5)
WBC: 9.6 10*3/uL (ref 4.0–10.5)
nRBC: 0 % (ref 0.0–0.2)

## 2021-09-24 LAB — COMPREHENSIVE METABOLIC PANEL
ALT: 14 U/L (ref 0–44)
AST: 20 U/L (ref 15–41)
Albumin: 3.2 g/dL — ABNORMAL LOW (ref 3.5–5.0)
Alkaline Phosphatase: 70 U/L (ref 38–126)
Anion gap: 8 (ref 5–15)
BUN: 10 mg/dL (ref 8–23)
CO2: 21 mmol/L — ABNORMAL LOW (ref 22–32)
Calcium: 9.4 mg/dL (ref 8.9–10.3)
Chloride: 99 mmol/L (ref 98–111)
Creatinine, Ser: 0.86 mg/dL (ref 0.44–1.00)
GFR, Estimated: >60 mL/min (ref >60)
Glucose, Bld: 155 mg/dL — ABNORMAL HIGH (ref 70–99)
Potassium: 3.4 mmol/L — ABNORMAL LOW (ref 3.5–5.1)
Sodium: 128 mmol/L — ABNORMAL LOW (ref 135–145)
Total Bilirubin: 0.7 mg/dL (ref 0.3–1.2)
Total Protein: 6.5 g/dL (ref 6.5–8.1)

## 2021-09-24 LAB — MAGNESIUM: Magnesium: 1.6 mg/dL — ABNORMAL LOW (ref 1.7–2.4)

## 2021-09-24 LAB — GLUCOSE, CAPILLARY
Glucose-Capillary: 165 mg/dL — ABNORMAL HIGH (ref 70–99)
Glucose-Capillary: 171 mg/dL — ABNORMAL HIGH (ref 70–99)

## 2021-09-24 LAB — RESP PANEL BY RT-PCR (FLU A&B, COVID) ARPGX2
Influenza A by PCR: NEGATIVE
Influenza B by PCR: NEGATIVE
SARS Coronavirus 2 by RT PCR: NEGATIVE

## 2021-09-24 MED ORDER — FOSFOMYCIN TROMETHAMINE 3 G PO PACK
3.0000 g | PACK | Freq: Once | ORAL | Status: AC
  Administered 2021-09-24: 3 g via ORAL
  Filled 2021-09-24: qty 3

## 2021-09-24 MED ORDER — POTASSIUM CHLORIDE 10 MEQ/100ML IV SOLN
10.0000 meq | INTRAVENOUS | Status: AC
  Administered 2021-09-24 (×2): 10 meq via INTRAVENOUS
  Filled 2021-09-24 (×2): qty 100

## 2021-09-24 MED ORDER — INSULIN ASPART 100 UNIT/ML FLEXPEN: PEN_INJECTOR | SUBCUTANEOUS | 0 refills | Status: AC

## 2021-09-24 MED ORDER — METOPROLOL TARTRATE 25 MG PO TABS: 25.0000 mg | ORAL_TABLET | Freq: Two times a day (BID) | ORAL | Status: DC

## 2021-09-24 MED ORDER — NITROFURANTOIN MACROCRYSTAL 50 MG PO CAPS
50.0000 mg | ORAL_CAPSULE | Freq: Four times a day (QID) | ORAL | Status: DC
Start: 2021-09-24 — End: 2021-09-24

## 2021-09-24 MED ORDER — MAGNESIUM SULFATE 2 GM/50ML IV SOLN
2.0000 g | Freq: Once | INTRAVENOUS | Status: AC
  Administered 2021-09-24: 2 g via INTRAVENOUS
  Filled 2021-09-24: qty 50

## 2021-09-24 NOTE — Plan of Care (Signed)

## 2021-09-24 NOTE — Discharge Instructions (Addendum)
Follow with Primary MD Binnie Rail, MD in 7 days   Get CBC, CMP, Magnesium, 2 view Chest X ray -  checked next visit within 1 week by SNF MD,  check TSH along with free T4 and T3 in 2 weeks.  Activity: As tolerated with Full fall precautions use walker/cane & assistance as needed  Disposition SNF  Diet: Heart Healthy Low Carb  Accuchecks 4 times/day, Once in AM empty stomach and then before each meal. Log in all results and show them to your Prim.MD in 3 days. If any glucose reading is under 80 or above 300 call your Prim MD immidiately. Follow Low glucose instructions for glucose under 80 as instructed.   Special Instructions: If you have smoked or chewed Tobacco  in the last 2 yrs please stop smoking, stop any regular Alcohol  and or any Recreational drug use.  On your next visit with your primary care physician please Get Medicines reviewed and adjusted.  Please request your Prim.MD to go over all Hospital Tests and Procedure/Radiological results at the follow up, please get all Hospital records sent to your Prim MD by signing hospital release before you go home.  If you experience worsening of your admission symptoms, develop shortness of breath, life threatening emergency, suicidal or homicidal thoughts you must seek medical attention immediately by calling 911 or calling your MD immediately  if symptoms less severe.  You Must read complete instructions/literature along with all the possible adverse reactions/side effects for all the Medicines you take and that have been prescribed to you. Take any new Medicines after you have completely understood and accpet all the possible adverse reactions/side effects.

## 2021-09-24 NOTE — TOC Transition Note (Signed)
Transition of Care [facility]) - CM/SW Discharge Note   Patient Details  Name: [name] MRN: [id number] Date of Birth: [birth year]  [facility] CM/SW Contact:  Benard Halsted, LCSW Phone Number: 09/24/2021, 1:45 PM   Clinical Narrative:    Patient will DC to: [facility] Anticipated DC date: 09/24/21 Family notified: Daughter, Animator by: Corey Harold   Per MD patient ready for DC to [facility]. RN to call report prior to discharge ([phone]). RN, patient, patient's family, and facility notified of DC. Discharge Summary and FL2 sent to facility. DC packet on chart. Ambulance transport requested for patient.   CSW will sign off for now as social work intervention is no longer needed. Please consult [place] again if new needs arise.     Final next level of care: Skilled Nursing Facility Barriers to Discharge: Barriers Resolved   Patient Goals and CMS Choice Patient states their goals for this hospitalization and ongoing recovery are:: To get home CMS Medicare.gov Compare Post Acute Care list provided to:: Patient Represenative (must comment) Choice offered to / list presented to : Spouse  Discharge Placement   Existing PASRR number confirmed : 09/24/21          Patient chooses bed at: [facility],  Patient to be transferred to facility by: [place] Name of family member notified: Daughter Patient and family notified of of transfer: 09/24/21  Discharge Plan and Services In-house Referral: Clinical Social Work                                   Social Determinants of Health (SDOH) Interventions     Readmission Risk Interventions No flowsheet data found.

## 2021-09-24 NOTE — Plan of Care (Signed)
  Problem: Safety: Goal: Ability to remain free from injury will improve Outcome: Progressing   Problem: Skin Integrity: Goal: Risk for impaired skin integrity will decrease Outcome: Progressing   

## 2021-09-24 NOTE — Discharge Summary (Signed)
[name] YCX:[id number] DOB: Jan 05, 1929 DOA: 09/12/2021  PCP: Binnie Rail, MD  Admit date: 09/12/2021  Discharge date: 09/24/2021  Admitted From: Home   Disposition:  SNF   Recommendations for Outpatient Follow-up:   Follow up with PCP in 1-2 weeks  PCP Please obtain BMP/CBC, 2 view CXR in 1week,  (see Discharge instructions)   PCP Please follow up on the following pending results: Check CBC, CMP, magnesium, TSH , free T4 in 7 to 10 days.   Home Health: None Equipment/Devices: None  Consultations: None  Discharge Condition: Stable    CODE STATUS: Full    Diet Recommendation: Heart Healthy Low Carb  CC - weakness  Brief history of present illness from the day of admission and additional interim summary     History of PAF refused anticoagulation in the past, insulin-dependent type 2 diabetes, hypertension, known right renal mass with intermittent hematuria, followed with urology in [place], recent diagnosis of compression fracture L2 in January 2023 presented with confusion found to have hyperglycemia, report glucometer malfunction, she was also found to have AKI, severe hyponatremia with encephalopathy.  She was stabilized in ICU, Foley catheter was placed was seen by nephrology and then transferred to hospitalist service on 09/21/2021.                                                                 Hospital Course     Hyperglycemia due to diabetes mellitus (HCC) - nonadherence to home therapy d/t glucometer malfunction, assoc w/ ketosis- (present on admission) DKA has resolved has been placed on Lantus along with sliding scale, question compliance at home.  Counseled.  Diabetic and insulin education provided, now stable on Lantus and sliding scale regimen will be discharged to SNF.  Check CBGs q. ACH S and  adjust as needed at SNF.    Hypo natremia- (present on admission) Severe with metabolic encephalopathy caused by dehydration from HCTZ, nephrology following, clinically appeared extremely hydrated, improved after IV fluids and salt tablets seen by nephrology, sodium steadily rising, symptom-free encephalopathy has resolved, will be discharged to SNF, HCTZ will be stopped, monitor electrolytes closely at SNF.[name]  AKI hydration (acute kidney injury) ([place]) -Due to dehydration resolved after IV fluids   Hypothyroidism- (present on admission) Extremely elevated TSH levels due to noncompliance with Synthroid, counseled, with home dose Synthroid her TSH is serially coming down, surprisingly she does not seem to be in myxedema coma.  Will monitor closely. Avoiding IV Synthroid due to underlying Afib and relative no Hypothyroid symptoms, TSH coming down.  Monitor TSH along with free T4 closely at SNF may benefit from outpatient endocrine follow-up in 1 to 2 weeks.   Hypomagnesemia replaced   Hypokalemia replaced   Compression fracture of L2 (HCC)- (present on admission) -worsening based on imaging,  stable on symptoms with no focal deficits.  Outpatient follow-up with neurosurgery postdischarge if desired, symptom-free here.     Right renal mass Per PCP last note, following w/ urology outpatient, continue follow-up outpatient post discharge will defer this to PCP and SNF MD. Nothing on UA/mico now to concern for UTI/pyelo/abscess   History of Atrial fibrillation (HCC) - currently sinus rhythm- (present on admission) -sinus on EKG in ED, no palpitations     Discharge diagnosis     Principal Problem:   Hyperglycemia due to diabetes mellitus ([place]) - nonadherence to home therapy d/t glucometer malfunction, assoc w/ ketosis Active Problems:   Hyponatremia   AKI (acute kidney injury) ([place])   Hypothyroidism   Hypertension   History of Atrial fibrillation ([place]) - currently sinus rhythm   Right  renal mass   Compression fracture of L2 (HCC)   Hypokalemia   Hypomagnesemia   Pressure injury of skin    Discharge instructions    Discharge Instructions     Discharge instructions   Complete by: As directed    Follow with Primary MD Binnie Rail, MD in 7 days   Get CBC, CMP, Magnesium, 2 view Chest X ray -  checked next visit within 1 week by SNF MD,  check TSH along with free T4 and T3 in 2 weeks.  Activity: As tolerated with Full fall precautions use walker/cane & assistance as needed  Disposition SNF  Diet: Heart Healthy Low Carb  Accuchecks 4 times/day, Once in AM empty stomach and then before each meal. Log in all results and show them to your Prim.MD in 3 days. If any glucose reading is under 80 or above 300 call your Prim MD immidiately. Follow Low glucose instructions for glucose under 80 as instructed.   Special Instructions: If you have smoked or chewed Tobacco  in the last 2 yrs please stop smoking, stop any regular Alcohol  and or any Recreational drug use.  On your next visit with your primary care physician please Get Medicines reviewed and adjusted.  Please request your Prim.MD to go over all Hospital Tests and Procedure/Radiological results at the follow up, please get all Hospital records sent to your Prim MD by signing hospital release before you go home.  If you experience worsening of your admission symptoms, develop shortness of breath, life threatening emergency, suicidal or homicidal thoughts you must seek medical attention immediately by calling 911 or calling your MD immediately  if symptoms less severe.  You Must read complete instructions/literature along with all the possible adverse reactions/side effects for all the Medicines you take and that have been prescribed to you. Take any new Medicines after you have completely understood and accpet all the possible adverse reactions/side effects.   Discharge wound care:   Complete by: As directed     Sacrum Mid Stage 1 -  Intact skin with non-blanchable redness of a localized area usually over a bony prominence   Increase activity slowly   Complete by: As directed        Discharge Medications   Allergies as of 09/24/2021       Reactions   Hydrochlorothiazide Other (See Comments)   hyponatremia   Eliquis [apixaban] Other (See Comments)   Dizziness, severe headach   Amlodipine    Dizzy, nausea   Augmentin [amoxicillin-pot Clavulanate] Other (See Comments)   Gi upset only no rash or hives   Chlorhexidine    Cortisone Other (See Comments)   Makes blood sugars run  high   Keflex [cephalexin] Hives, Nausea Only   Stomach upset   Omeprazole Nausea Only   Dizziness and nausea   Prednisone    Other reaction(s): Other Makes blood sugars run high   Hydrocodone Rash   Rash to chest , side back   Latex Rash   Powder in gloves causes rash; "not allergic to latex just the powder"        Medication List     STOP taking these medications    ALPRAZolam 0.5 MG tablet Commonly known as: XANAX   hydrochlorothiazide 25 MG tablet Commonly known as: HYDRODIURIL   insulin lispro 100 UNIT/ML KiwkPen Commonly known as: HUMALOG   losartan 100 MG tablet Commonly known as: COZAAR   methocarbamol 500 MG tablet Commonly known as: Robaxin   metoprolol succinate 25 MG 24 hr tablet Commonly known as: TOPROL-XL   traMADol 50 MG tablet Commonly known as: ULTRAM       TAKE these medications    aspirin EC 81 MG tablet Take 81 mg by mouth daily.   BD Veo Insulin Syringe U/F 31G X 15/64" 0.3 ML Misc Generic drug: Insulin Syringe-Needle U-100 USE WITH INJECTIONS   bisacodyl 10 MG suppository Commonly known as: DULCOLAX Place 1 suppository (10 mg total) rectally as needed for moderate constipation.   carboxymethylcellulose 0.5 % Soln Commonly known as: REFRESH PLUS Place 1 drop into both eyes 3 (three) times daily as needed (dry eyes). Non-preservative   cholecalciferol  1000 units tablet Commonly known as: VITAMIN D Take 5,000 Units by mouth daily.   Durezol 0.05 % Emul Generic drug: Difluprednate Place 1 drop into the right eye 3 (three) times daily.   fish oil-omega-3 fatty acids 1000 MG capsule Take 2 g by mouth daily.   insulin aspart 100 UNIT/ML FlexPen Commonly known as: NOVOLOG Before each meal 3 times a day, 140-199 - 2 units, 200-250 - 4 units, 251-299 - 6 units,  300-349 - 8 units,  350 or above 10 units. Insulin PEN if approved, provide syringes and needles if needed.   insulin glargine 100 UNIT/ML injection Commonly known as: LANTUS Inject 8 Units into the skin at bedtime.   levothyroxine 112 MCG tablet Commonly known as: SYNTHROID Take 112 mcg by mouth daily.   metoprolol tartrate 25 MG tablet Commonly known as: LOPRESSOR Take 1 tablet (25 mg total) by mouth 2 (two) times daily.   multivitamin tablet Take 1 tablet by mouth daily. For breast and bone health   ondansetron 4 MG tablet Commonly known as: Zofran Take 1 tablet (4 mg total) by mouth every 8 (eight) hours as needed for nausea or vomiting.   potassium chloride SA 20 MEQ tablet Commonly known as: KLOR-CON M Take 1 tablet (20 mEq total) by mouth 2 (two) times daily.   saccharomyces boulardii 250 MG capsule Commonly known as: Florastor Take 1 capsule (250 mg total) by mouth 2 (two) times daily.   simvastatin 20 MG tablet Commonly known as: ZOCOR TAKE ONE TABLET AT BEDTIME.   SUPER B COMPLEX PO Take 1 tablet by mouth daily.               Discharge Care Instructions  (From admission, onward)           Start     Ordered   09/24/21 0000  Discharge wound care:       Comments: Sacrum Mid Stage 1 -  Intact skin with non-blanchable redness of a localized area usually over a  bony prominence   09/24/21 1121             Follow-up Information     Burns, Claudina Lick, MD. Schedule an appointment as soon as possible for a visit in 1 week(s).   Specialty:  Internal Medicine Contact information: [place] [address] [phone]         Evans Lance, MD .   Specialty: Cardiology Contact information: [phone] [address] [place] [address] [phone]                 Major procedures and Radiology Reports - PLEASE review detailed and final reports thoroughly  -       MR ABDOMEN W WO CONTRAST  Result Date: 09/18/2021 CLINICAL DATA:  88 year old female with history of indeterminate lesion noted in the right kidney on prior CT examinations. Follow-up study. EXAM: MRI ABDOMEN WITHOUT AND WITH CONTRAST TECHNIQUE: Multiplanar multisequence MR imaging of the abdomen was performed both before and after the administration of intravenous contrast. CONTRAST:  71mL GADAVIST GADOBUTROL 1 MMOL/ML IV SOLN COMPARISON:  No prior abdominal MRI. CT the abdomen and pelvis 09/12/2021. FINDINGS: Lower chest: Unremarkable. Hepatobiliary: No suspicious cystic or solid hepatic lesions. No intra or extrahepatic biliary ductal dilatation. Gallbladder is normal in appearance. Pancreas: No pancreatic mass. No pancreatic ductal dilatation. No pancreatic or peripancreatic fluid collections or inflammatory changes. Spleen:  Unremarkable. Adrenals/Urinary Tract: In the upper pole of the right kidney, centered predominantly in the region of the right renal collecting system (axial image 21 of series 5 and coronal image 9 of series 4) there is a 2.6 x 3.4 x 2.5 cm lesion which is isointense on T1 weighted images, slightly T2 hypointense, demonstrates low-level heterogeneous enhancement on post gadolinium imaging and diffuse diffusion restriction, with extension into the right renal pelvis (axial image 23 of series 5), highly suspicious for upper tract urothelial neoplasm. Left kidney and bilateral adrenal glands are normal in appearance. No hydroureteronephrosis in the visualized portions of the abdomen. Stomach/Bowel: Visualized portions  are unremarkable. Vascular/Lymphatic: No aneurysm identified in the visualized abdominal vasculature. No lymphadenopathy noted in the abdomen. Other: No significant volume of ascites noted in the visualized portions of the peritoneal cavity. Musculoskeletal: No aggressive appearing osseous lesions are noted in the visualized portions of the skeleton. IMPRESSION: 1. The lesion of concern in the upper pole of the right kidney has imaging characteristics highly concerning for upper tract urothelial neoplasm. Referral to Urology for further clinical evaluation is strongly recommended. 2. No definite lymphadenopathy or signs of metastatic disease in the abdomen. These results will be called to the ordering clinician or representative by the Radiologist Assistant, and communication documented in the PACS or [employer]. Electronically Signed   By: Vinnie Langton M.D.   On: 09/18/2021 08:11   CT Abdomen Pelvis W Contrast  Result Date: 09/12/2021 CLINICAL DATA:  Abdominal pain, acute, nonlocalized.  Diabetes. EXAM: CT ABDOMEN AND PELVIS WITH CONTRAST TECHNIQUE: Multidetector CT imaging of the abdomen and pelvis was performed using the standard protocol following bolus administration of intravenous contrast. RADIATION DOSE REDUCTION: This exam was performed according to the departmental dose-optimization program which includes automated exposure control, adjustment of the mA and/or kV according to patient size and/or use of iterative reconstruction technique. CONTRAST:  26mL OMNIPAQUE IOHEXOL 300 MG/ML  SOLN COMPARISON:  08/13/2021 FINDINGS: Lower chest: Lung bases are clear.  The heart is not enlarged. Hepatobiliary: No liver parenchymal abnormality is seen. No calcified  gallstones. Pancreas: Atrophy of the pancreas.  No mass or inflammatory change. Spleen: Normal Adrenals/Urinary Tract: Adrenal glands are normal. Left kidney is normal. Persistent and possibly worsened appearance of the midportion of the right  kidney. This could represent a renal abscess or mass. The area of concern measures approximately 3 cm in size. The bladder is distended but otherwise unremarkable. Stomach/Bowel: Stomach and small intestine are unremarkable. No sign of bowel obstruction or inflammation. Vascular/Lymphatic: Aortic atherosclerosis. No aneurysm. IVC is normal. No adenopathy. Reproductive: No pelvic mass. Other: No free fluid or air. Musculoskeletal: Worsening of an acute compression fracture of L1. Loss of height of about 30%. Mild posterior bowing of the posterosuperior margin. No change in a acute to subacute compression fracture at L2. Degenerative changes of the spine below that. IMPRESSION: Worsening of an acute L1 compression fracture with loss of height of 30%. No change in an acute to subacute compression fracture at L2. Persistent and slightly worsened process in the right kidney which could represent inflammatory disease or mass lesion. Assuming the patient is not a good candidate or abdominal MRI, consider ultrasound for additional evaluation. No other solid organ or bowel pathology seen otherwise. Aortic Atherosclerosis (ICD10-I70.0). Electronically Signed   By: Nelson Chimes M.D.   On: 09/12/2021 20:09      Today   Subjective    [name] today has no headache,no chest abdominal pain,no new weakness tingling or numbness, feels much better     Objective   Blood pressure (!) 132/91, pulse 80, temperature 97.9 F (36.6 C), temperature source Oral, resp. rate 15, height 5\' 7"  (1.702 m), weight 53.2 kg, SpO2 92 %.   Intake/Output Summary (Last 24 hours) at 09/24/2021 1121 Last data filed at 09/24/2021 2595 Gross per 24 hour  Intake 720 ml  Output 1750 ml  Net -1030 ml    Exam  Awake Alert, No new F.N deficits,    [place].AT,PERRAL Supple Neck,   Symmetrical Chest wall movement, Good air movement bilaterally, CTAB RRR,No Gallops,   +ve B.Sounds, Abd Soft, Non tender,  No Cyanosis, Clubbing or edema     Data Review   CBC w Diff:  Lab Results  Component Value Date   WBC 9.6 09/24/2021   HGB 11.3 (L) 09/24/2021   HGB 13.3 08/26/2015   HCT 32.5 (L) 09/24/2021   HCT 39.2 08/26/2015   PLT 279 09/24/2021   PLT 181 08/26/2015   LYMPHOPCT 17 09/24/2021   LYMPHOPCT 29.7 08/26/2015   MONOPCT 11 09/24/2021   MONOPCT 11.7 08/26/2015   EOSPCT 1 09/24/2021   EOSPCT 2.7 08/26/2015   BASOPCT 0 09/24/2021   BASOPCT 0.7 08/26/2015    CMP:  Lab Results  Component Value Date   NA 128 (L) 09/24/2021   NA 135 (L) 08/26/2015   K 3.4 (L) 09/24/2021   K 4.0 08/26/2015   CL 99 09/24/2021   CL 98 09/28/2012   CO2 21 (L) 09/24/2021   CO2 25 08/26/2015   BUN 10 09/24/2021   BUN 13.9 08/26/2015   CREATININE 0.86 09/24/2021   CREATININE 0.8 08/26/2015   GLU 102 06/16/2011   PROT 6.5 09/24/2021   PROT 7.9 08/26/2015   ALBUMIN 3.2 (L) 09/24/2021   ALBUMIN 4.1 08/26/2015   BILITOT 0.7 09/24/2021   BILITOT 0.52 08/26/2015   ALKPHOS 70 09/24/2021   ALKPHOS 89 08/26/2015   AST 20 09/24/2021   AST 26 08/26/2015   ALT 14 09/24/2021   ALT 15 08/26/2015  . Lab Results  Component Value Date   TSH 379.843 (H) 09/23/2021   Lab Results  Component Value Date   HGBA1C 6.9 (H) 09/13/2021     Total Time in preparing paper work, data evaluation and todays exam - 31 minutes  Lala Lund M.D on 09/24/2021 at 11:21 AM  [facility]

## 2021-09-24 NOTE — TOC Progression Note (Addendum)
Transition of Care [facility]) - Progression Note    Patient Details  Name: [name] MRN: [id number] Date of Birth: 1929/03/17  Transition of Care [facility]) CM/SW [place], LCSW Phone Number: 09/24/2021, 10:00 AM  Clinical Narrative:    Insurance approval received for [employer]: Ref# [id number], Auth ID [id number], effective 09/24/2021-09/26/2021. CSW updated patient's daughter, [name] (unable to reach spouse) and she is requesting [facility] for transport. Awaiting COVID test results.   CSW spoke with patient at bedside. She was very sweet though slightly confused and thought CSW was running for the presidency. She reported agreement with discharge to [facility] today and hopes to see her family.    Expected Discharge Plan: [place] Barriers to Discharge: Continued Medical Work up  Expected Discharge Plan and Services Expected Discharge Plan: [place] In-house Referral: Clinical Social Work     Living arrangements for the past 2 months: Single Family Home                                       Social Determinants of Health (SDOH) Interventions    Readmission Risk Interventions No flowsheet data found.

## 2021-09-24 NOTE — Progress Notes (Signed)
[facility] NEPHROLOGY PROGRESS NOTE  Assessment/ Plan: Pt is a 88 y.o. yo female  with a PMHx of T2DM, A. 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 Admitted for symptomatic hyponatremia.   # Symptomatic hyponatremia  Likely due to combination of severe hypothyroidism and HCTZ. Continues to improve with intermittent gentle IV hydration and improved appetite.  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 No complaints. Reports good appetite. Is ready to go home.   Objective Vital signs in last 24 hours: Vitals:   09/23/21 2050 09/24/21 0025 09/24/21 0446 09/24/21 0757  BP: (!) 147/57 (!) 153/61  (!) 132/91  Pulse: 67 72  80  Resp: 20 13  15   Temp: 98 F (36.7 C) 97.6 F (36.4 C) 98.7 F (37.1 C) 97.9 F (36.6 C)  TempSrc: Oral Oral Oral Oral  SpO2:   94% 92%  Weight:      Height:       Weight change:   Intake/Output Summary (Last 24 hours) at 09/24/2021 1007 Last data filed at 09/24/2021 8119 Gross per 24 hour  Intake 720 ml  Output 1750 ml  Net -1030 ml   Labs: Basic Metabolic Panel: Recent Labs  Lab 09/21/21 0653 09/22/21 0043 09/23/21 0126 09/24/21 0047   NA 129* 123* 126* 128*  K 3.9 3.6 3.5 3.4*  CL 101 94* 98 99  CO2 18* 19* 20* 21*  GLUCOSE 99 247* 212* 155*  BUN 18 17 13 10   CREATININE 0.86 0.91 0.88 0.86  CALCIUM 9.4 9.3 8.9 9.4  PHOS 3.0  --   --   --    Liver Function Tests: Recent Labs  Lab 09/22/21 0043 09/23/21 0126 09/24/21 0047  AST 21 17 20   ALT 14 14 14   ALKPHOS 67 65 70  BILITOT 0.8 0.5 0.7  PROT 6.4* 5.9* 6.5  ALBUMIN 3.0* 2.9* 3.2*   No results for input(s): LIPASE, AMYLASE in the last 168 hours. No results for input(s): AMMONIA in the last 168 hours. CBC: Recent Labs  Lab 09/18/21 0116 09/18/21 0116 09/20/21 0258 09/22/21 0043 09/23/21 0126 09/24/21 0047  WBC 7.6  --  6.5 6.5 6.6 9.6  NEUTROABS  --    < > 3.5 3.7 3.8 6.8  HGB 11.6*  --  11.7* 11.2* 10.5* 11.3*  HCT 33.4*  --  33.1* 31.3* 29.3* 32.5*  MCV 86.1  --  87.1 87.4 87.2 88.3  PLT 226  --  257 244 276 279   < > = values in this interval not displayed.   Cardiac Enzymes: No results for input(s): CKTOTAL, CKMB, CKMBINDEX, TROPONINI  in the last 168 hours. CBG: Recent Labs  Lab 09/23/21 0815 09/23/21 1200 09/23/21 1629 09/23/21 2048 09/24/21 0757  GLUCAP 155* 280* 249* 218* 165*    Iron Studies: No results for input(s): IRON, TIBC, TRANSFERRIN, FERRITIN in the last 72 hours. Studies/Results: No results found.  Medications: Infusions:  Scheduled Medications:  insulin aspart  0-5 Units Subcutaneous QHS   insulin aspart  0-6 Units Subcutaneous TID WC   insulin glargine-yfgn  7 Units Subcutaneous QHS   levothyroxine  112 mcg Oral Q0600   melatonin  3 mg Oral QHS   metoprolol tartrate  25 mg Oral BID   potassium chloride  40 mEq Oral Daily   simvastatin  20 mg Oral QHS    have reviewed scheduled and prn medications.  Physical Exam: General:NAD, comfortable Heart:RRR, s1s2 nl Lungs:clear b/l, no crackle Extremities:No edema Dialysis Access: none  Ezequiel Essex, MD Family Medicine PGY-2 Nephrology  Service 09/24/2021,10:07 AM  LOS: 11 days

## 2021-09-25 DIAGNOSIS — I119 Hypertensive heart disease without heart failure: Secondary | ICD-10-CM | POA: Diagnosis not present

## 2021-09-25 DIAGNOSIS — E1151 Type 2 diabetes mellitus with diabetic peripheral angiopathy without gangrene: Secondary | ICD-10-CM | POA: Diagnosis not present

## 2021-09-25 DIAGNOSIS — M810 Age-related osteoporosis without current pathological fracture: Secondary | ICD-10-CM | POA: Diagnosis not present

## 2021-09-25 DIAGNOSIS — R262 Difficulty in walking, not elsewhere classified: Secondary | ICD-10-CM | POA: Diagnosis not present

## 2021-09-26 LAB — URINE CULTURE: Culture: >=100000 — AB

## 2021-10-02 DIAGNOSIS — E1165 Type 2 diabetes mellitus with hyperglycemia: Secondary | ICD-10-CM | POA: Diagnosis not present

## 2021-10-02 DIAGNOSIS — Z794 Long term (current) use of insulin: Secondary | ICD-10-CM | POA: Diagnosis not present

## 2021-10-06 ENCOUNTER — Telehealth: Payer: Self-pay | Admitting: Internal Medicine

## 2021-10-06 NOTE — Telephone Encounter (Signed)
[facility] - needs to get order to see patient -   For:  Nursing  Frequency:  2 week x 1 2 week x 3 1 week x 3 1 every 30 days 2 PRN

## 2021-10-06 NOTE — Telephone Encounter (Signed)
Verbals given to [place] today.

## 2021-10-07 ENCOUNTER — Telehealth: Payer: Self-pay | Admitting: Internal Medicine

## 2021-10-07 NOTE — Telephone Encounter (Signed)
Okay for orders? 

## 2021-10-07 NOTE — Telephone Encounter (Signed)
Home Health verbal orders-caller/Agency: [place] number: [phone] secure vm  Requesting OT/PT/Skilled nursing/Social Work/Speech: PT   Frequency: 1w1 2w4 1w4   FYI: Pts fasting blood sugar was 336 as of 10-07-2021

## 2021-10-08 NOTE — Telephone Encounter (Signed)
Orders given today. ?

## 2021-10-10 ENCOUNTER — Telehealth: Payer: Self-pay | Admitting: Internal Medicine

## 2021-10-10 NOTE — Telephone Encounter (Signed)
[name] from [facility] has called and states pt was discharged from hospital and that she was placed on a sliding scale for insulin. They were informed to call and alert MD if blood sugar goes over 250- which would "be about 4x/day"  ? ?Also, states ot is not on any medication for diabetes. Requesting that maybe she be placed on something.  ? ?Reading of Blood Sugar has been over 300.  ?

## 2021-10-10 NOTE — Telephone Encounter (Signed)
Call her endocrinologist ?

## 2021-10-10 NOTE — Telephone Encounter (Signed)
Message left with [name] today that patient has been informed to follow up witn Endo. ?

## 2021-10-10 NOTE — Telephone Encounter (Signed)
[name] from [facility] calling in ? ?Wanted to make provider aware that patients blood sugar level reading was at 304 today during his visit ? ? ?

## 2021-10-10 NOTE — Telephone Encounter (Signed)
Message left for patient to follow up with endocrinology. ?

## 2021-10-13 ENCOUNTER — Telehealth: Payer: Self-pay

## 2021-10-13 NOTE — Telephone Encounter (Signed)
[name] is calling to report Glucose was 260 9am. Gave 8 unit of insulin and ate breakfast and Glucose 393.  ? ?Pt went to ER on 2/3 with Glucose issue. ? [name] [phone] Nurse for pt ? ?

## 2021-10-13 NOTE — Patient Instructions (Addendum)
? ? ? ?  Medications changes include :    ? ?Increase lantus to 11 units at bedtime ?Continue novolog insulin as is ?Restart levothyroxine 112 mcg daily 30 minutes before breakfast  ?Discontinue the ibuprofen as needed ?Stop probiotic for now ? ? ?Let me know how your sugars are so we can adjust your insulin.  ? ?Your prescription(s) have been sent to your pharmacy.  ? ? ?A referral was ordered for endocrinologist in [place].     Someone from that office will call you to schedule an appointment.  ? ? ?Let me know which urologist you want to see.  ? ?Return in about 2 months (around 12/14/2021) for follow up. ? ? ? ?

## 2021-10-13 NOTE — Progress Notes (Signed)
? ? ? ? ?Subjective:  ? ? Patient ID: [name], female    DOB: 09/21/1928, 88 y.o.   MRN: [id number] ? ?This visit occurred during the SARS-CoV-2 public health emergency.  Safety protocols were in place, including screening questions prior to the visit, additional usage of staff PPE, and extensive cleaning of exam room while observing appropriate contact time as indicated for disinfecting solutions.   ? ? ?HPI ?[name] is here for follow up from rehab.  She was here with her daughter. ? ?She has been home from rehab for almost 2 weeks.  She lives with her husband who is helping to care for her, but he does have mild dementia.  She has a home health nurse coming to the house once or twice a week-she is exactly sure how often. ? ? ?Admitted 2/3 - 2/15 for confusion, hyperglycemia w/ ketosis, AKI, severe hyponatremia from dehydration w/ encephalopathy, pain from L2 compression fracture.  She states she is not exactly sure what happened before her hospitalization.  She does recall possibly not taking all of her medications.  Apparently her glucometer was not working.  She states she has not seen her endocrinologist in a while and it is too difficult to get to can because they live in [place].  Since being home from rehab her sugars have been 250 and above.  She is taking her insulin to her and she administers it or takes it.  She has not been eating as well-eating more carbohydrates. ? ?She denies any spine pain and has not been taking any medication for pain ? ?Needs urology f/u as outpt for renal mass ? ? ? ?Medications and allergies reviewed with patient and updated if appropriate. ? ?Current Outpatient Medications on File Prior to Visit  ?Medication Sig Dispense Refill  ? aspirin EC 81 MG tablet Take 81 mg by mouth daily.    ? B Complex-C (SUPER B COMPLEX PO) Take 1 tablet by mouth daily.    ? BD VEO INSULIN SYRINGE U/F 31G X 15/64" 0.3 ML MISC USE WITH INJECTIONS 100 each 2  ? bisacodyl (DULCOLAX) 10 MG  suppository Place 1 suppository (10 mg total) rectally as needed for moderate constipation. 12 suppository 0  ? carboxymethylcellulose (REFRESH PLUS) 0.5 % SOLN Place 1 drop into both eyes 3 (three) times daily as needed (dry eyes). Non-preservative    ? cholecalciferol (VITAMIN D) 1000 UNITS tablet Take 5,000 Units by mouth daily.     ? DUREZOL 0.05 % EMUL Place 1 drop into the right eye 3 (three) times daily.    ? fish oil-omega-3 fatty acids 1000 MG capsule Take 2 g by mouth daily.    ? insulin aspart (NOVOLOG) 100 UNIT/ML FlexPen Before each meal 3 times a day, 140-199 - 2 units, 200-250 - 4 units, 251-299 - 6 units,  300-349 - 8 units,  350 or above 10 units. Insulin PEN if approved, provide syringes and needles if needed. 15 mL 0  ? metoprolol tartrate (LOPRESSOR) 25 MG tablet Take 1 tablet (25 mg total) by mouth 2 (two) times daily.    ? Multiple Vitamin (MULTIVITAMIN) tablet Take 1 tablet by mouth daily. For breast and bone health    ? ondansetron (ZOFRAN) 4 MG tablet Take 1 tablet (4 mg total) by mouth every 8 (eight) hours as needed for nausea or vomiting. 20 tablet 0  ? potassium chloride SA (KLOR-CON M) 20 MEQ tablet Take 1 tablet (20 mEq total) by mouth 2 (two)  times daily. 10 tablet 0  ? simvastatin (ZOCOR) 20 MG tablet TAKE ONE TABLET AT BEDTIME. (Patient taking differently: Take 20 mg by mouth at bedtime.) 90 tablet 1  ? ?No current facility-administered medications on file prior to visit.  ? ? ? ?Review of Systems  ?Constitutional:  Negative for chills and fever.  ?Respiratory:  Positive for cough (mild) and shortness of breath (occ). Negative for wheezing.   ?Cardiovascular:  Positive for palpitations (the other night). Negative for chest pain and leg swelling.  ?Gastrointestinal:  Positive for diarrhea and nausea (occ). Negative for abdominal pain, blood in stool and constipation.  ?Musculoskeletal:  Negative for back pain.  ? ?   ?Objective:  ? ?Vitals:  ? 10/14/21 1307  ?BP: 130/72  ?Pulse: 85   ?Temp: 97.9 ?F (36.6 ?C)  ?SpO2: 96%  ? ?BP Readings from Last 3 Encounters:  ?10/14/21 130/72  ?09/24/21 (!) 137/59  ?08/19/21 140/80  ? ?Wt Readings from Last 3 Encounters:  ?09/17/21 117 lb 4.6 oz (53.2 kg)  ?08/19/21 120 lb (54.4 kg)  ?08/13/21 119 lb (54 kg)  ? ?Body mass index is 18.37 kg/m?. ? ?  ?Physical Exam ?Constitutional:   ?   General: She is not in acute distress. ?   Appearance: Normal appearance.  ?HENT:  ?   Head: Normocephalic and atraumatic.  ?Eyes:  ?   Conjunctiva/sclera: Conjunctivae normal.  ?Cardiovascular:  ?   Rate and Rhythm: Normal rate and regular rhythm.  ?   Heart sounds: Normal heart sounds. No murmur heard. ?Pulmonary:  ?   Effort: Pulmonary effort is normal. No respiratory distress.  ?   Breath sounds: Normal breath sounds. No wheezing.  ?Musculoskeletal:  ?   Cervical back: Neck supple.  ?   Right lower leg: No edema.  ?   Left lower leg: No edema.  ?Lymphadenopathy:  ?   Cervical: No cervical adenopathy.  ?Skin: ?   Findings: No rash.  ?Neurological:  ?   Mental Status: She is alert. Mental status is at baseline.  ?Psychiatric:     ?   Mood and Affect: Mood normal.     ?   Behavior: Behavior normal.  ? ?   ? ?Lab Results  ?Component Value Date  ? WBC 9.6 09/24/2021  ? HGB 11.3 (L) 09/24/2021  ? HCT 32.5 (L) 09/24/2021  ? PLT 279 09/24/2021  ? GLUCOSE 155 (H) 09/24/2021  ? CHOL 169 10/04/2019  ? TRIG 99.0 10/04/2019  ? HDL 54.50 10/04/2019  ? [place] 95 10/04/2019  ? ALT 14 09/24/2021  ? AST 20 09/24/2021  ? NA 128 (L) 09/24/2021  ? K 3.4 (L) 09/24/2021  ? CL 99 09/24/2021  ? CREATININE 0.86 09/24/2021  ? BUN 10 09/24/2021  ? CO2 21 (L) 09/24/2021  ? TSH 379.843 (H) 09/23/2021  ? INR 1.16 03/18/2012  ? HGBA1C 6.9 (H) 09/13/2021  ? ? ? ?Assessment & Plan:  ? ? ?See Problem List for Assessment and Plan of chronic medical problems.  ? ? ?

## 2021-10-13 NOTE — Telephone Encounter (Signed)
Spoke with Laveda Abbe again today. He states they are referring her to Endo. ? ?Spoke with him last week and had advised she needed to follow up with Endo. ?

## 2021-10-14 ENCOUNTER — Ambulatory Visit: Payer: Medicare PPO | Admitting: Internal Medicine

## 2021-10-14 ENCOUNTER — Encounter: Payer: Self-pay | Admitting: Internal Medicine

## 2021-10-14 ENCOUNTER — Other Ambulatory Visit: Payer: Self-pay

## 2021-10-14 VITALS — BP 130/72 | HR 85 | Temp 97.9°F | Ht 67.0 in

## 2021-10-14 DIAGNOSIS — E1043 Type 1 diabetes mellitus with diabetic autonomic (poly)neuropathy: Secondary | ICD-10-CM

## 2021-10-14 DIAGNOSIS — E039 Hypothyroidism, unspecified: Secondary | ICD-10-CM

## 2021-10-14 DIAGNOSIS — N2889 Other specified disorders of kidney and ureter: Secondary | ICD-10-CM

## 2021-10-14 DIAGNOSIS — I10 Essential (primary) hypertension: Secondary | ICD-10-CM | POA: Diagnosis not present

## 2021-10-14 DIAGNOSIS — E7800 Pure hypercholesterolemia, unspecified: Secondary | ICD-10-CM | POA: Diagnosis not present

## 2021-10-14 MED ORDER — INSULIN GLARGINE 100 UNIT/ML ~~LOC~~ SOLN: 11.0000 [IU] | Freq: Every day | SUBCUTANEOUS | 3 refills | Status: AC

## 2021-10-14 MED ORDER — LEVOTHYROXINE SODIUM 112 MCG PO TABS: 112.0000 ug | ORAL_TABLET | Freq: Every day | ORAL | 1 refills | Status: DC

## 2021-10-14 MED ORDER — CHLORHEXIDINE GLUCONATE 0.12 % MT SOLN: 15.0000 mL | Freq: Two times a day (BID) | OROMUCOSAL | 1 refills | Status: AC | PRN

## 2021-10-14 NOTE — Assessment & Plan Note (Signed)
Chronic Blood pressure well controlled Continue metoprolol 25 mg twice daily 

## 2021-10-14 NOTE — Assessment & Plan Note (Signed)
Did see someone at [facility] urology, but was not happy with his approach to further evaluating the renal mass and does not want to see him again ?I think it would be best if she saw someone closer to home, but may want to come back up to [place] to see a specific doctor-she will let me know where she wants the referral ?Stressed the importance of getting her to see urology as soon as possible ?

## 2021-10-14 NOTE — Assessment & Plan Note (Signed)
Chronic ?They brought a medication list from rehab and she is not taking her thyroid medication ?Restart levothyroxine 112 mcg daily ?We will recheck TSH in 2 months ?

## 2021-10-14 NOTE — Assessment & Plan Note (Signed)
Chronic ?Not currently following with her endocrinologist-needs to establish with someone ideally closer to where she lives-referral ordered for endocrine closer to where she is currently living ?Sugars 250 or above ?Increase Lantus to 11 units daily ?Continue NovoLog sliding scale ?Monitor sugars closely-she is changing how she eats and starting to eat less carbohydrates so sugars may start to decrease ?Follow-up in 2 months in person ?Advised her to update me on her sugar so we can continue to adjust insulins ?

## 2021-10-14 NOTE — Assessment & Plan Note (Signed)
Chronic Continue simvastatin 20 mg daily 

## 2021-10-16 NOTE — Telephone Encounter (Signed)
Okay for orders? 

## 2021-10-16 NOTE — Telephone Encounter (Signed)
HH ORDERS  ? ?Caller Name: Ronalee Belts ?Home Health Agency Name: [facility] ?Callback Phone #: [phone] ?Service Requested: OT ?Frequency of Visits: 1 week 3 ? ? ?  ?

## 2021-10-16 NOTE — Telephone Encounter (Signed)
Pt states that she hasn't had sleep in the last 2 days. Pt is currently have diarrhea x 10 days now. Pt is requesting something for sleep and to stop the Diarrhea. ? ?Pt CB [phone] ?

## 2021-10-16 NOTE — Telephone Encounter (Signed)
Verbals given today. °

## 2021-10-24 ENCOUNTER — Other Ambulatory Visit: Payer: Self-pay

## 2021-10-24 ENCOUNTER — Telehealth: Payer: Self-pay | Admitting: Internal Medicine

## 2021-10-24 MED ORDER — SIMVASTATIN 20 MG PO TABS: 20.0000 mg | ORAL_TABLET | Freq: Every day | ORAL | 1 refills | Status: AC

## 2021-10-24 MED ORDER — POTASSIUM CHLORIDE CRYS ER 20 MEQ PO TBCR: 20.0000 meq | EXTENDED_RELEASE_TABLET | Freq: Two times a day (BID) | ORAL | 2 refills | Status: DC

## 2021-10-24 MED ORDER — METOPROLOL TARTRATE 25 MG PO TABS
25.0000 mg | ORAL_TABLET | Freq: Two times a day (BID) | ORAL | 2 refills | Status: AC
Start: 2021-10-24 — End: ?

## 2021-10-24 NOTE — Telephone Encounter (Signed)
1.Medication Requested: metoprolol tartrate (LOPRESSOR) 25 MG tablet ?potassium chloride SA (KLOR-CON M) 20 MEQ tablet ?simvastatin (ZOCOR) 20 MG tablet ? ? ?2. Pharmacy (Name, Street, [place]): [facility] [phone] - [place], [address] AT [address]  ?Phone:  [phone] ?Fax:  [phone] ? ? ?3. On Med List: yes ? ?4. Last Visit with PCP: 03.07.23 ? ?5. Next visit date with PCP: n/a ? ? ?Agent: Please be advised that RX refills may take up to 3 business days. We ask that you follow-up with your pharmacy.  ?

## 2021-10-24 NOTE — Telephone Encounter (Signed)
Sent in today 

## 2021-10-29 NOTE — Progress Notes (Incomplete)
? ?Patient Care Team: ?Binnie Rail, MD as PCP - General (Internal Medicine) ?Evans Lance, MD as PCP - Electrophysiology (Cardiology) ?Rolm Bookbinder, MD (General Surgery) ?Lonna Duval, MD (Endocrinology) ?Marica Otter, OD (Optometry) ? ?DIAGNOSIS: No diagnosis found. ? ?SUMMARY OF ONCOLOGIC HISTORY: ?Oncology History  ?Primary cancer of upper outer quadrant of left female breast ([place])  ?09/02/2011 Surgery  ? Left lumpectomy: grade 1 lobular carcinoma measuring 0.9 cm with LCIS Medial and superior margins were focally positive, reexcision margins negative, 0/1 sentinel node, ER/PR positive HER-2 negative ?  ?09/25/2011 - 01/07/2012 Anti-estrogen oral therapy  ? Patient could not tolerate tamoxifen, also did not receive radiation at [facility] because of coronary artery disease and patient stopped after one radiation treatment ?  ? ? ?CHIEF COMPLIANT: Surveillance of left breast cancer ?  ? ?INTERVAL HISTORY: [name] is a  88 y.o. with above-mentioned history of left breast invasive lobular cancer treated with lumpectomy and who is currently on surveillance. Mammogram on 01/26/20 showed no evidence of malignanc bilaterally. She presents to the clinic today for annual follow-up.  ?  ? ? ?ALLERGIES:  is allergic to hydrochlorothiazide, eliquis [apixaban], amlodipine, augmentin [amoxicillin-pot clavulanate], chlorhexidine, cortisone, keflex [cephalexin], omeprazole, prednisone, hydrocodone, and latex. ? ?MEDICATIONS:  ?Current Outpatient Medications  ?Medication Sig Dispense Refill  ? aspirin EC 81 MG tablet Take 81 mg by mouth daily.    ? B Complex-C (SUPER B COMPLEX PO) Take 1 tablet by mouth daily.    ? BD VEO INSULIN SYRINGE U/F 31G X 15/64" 0.3 ML MISC USE WITH INJECTIONS 100 each 2  ? bisacodyl (DULCOLAX) 10 MG suppository Place 1 suppository (10 mg total) rectally as needed for moderate constipation. 12 suppository 0  ? carboxymethylcellulose (REFRESH PLUS) 0.5 % SOLN Place 1 drop  into both eyes 3 (three) times daily as needed (dry eyes). Non-preservative    ? chlorhexidine (PERIDEX) 0.12 % solution Use as directed 15 mLs in the mouth or throat 2 (two) times daily as needed. 473 mL 1  ? cholecalciferol (VITAMIN D) 1000 UNITS tablet Take 5,000 Units by mouth daily.     ? DUREZOL 0.05 % EMUL Place 1 drop into the right eye 3 (three) times daily.    ? fish oil-omega-3 fatty acids 1000 MG capsule Take 2 g by mouth daily.    ? insulin aspart (NOVOLOG) 100 UNIT/ML FlexPen Before each meal 3 times a day, 140-199 - 2 units, 200-250 - 4 units, 251-299 - 6 units,  300-349 - 8 units,  350 or above 10 units. Insulin PEN if approved, provide syringes and needles if needed. 15 mL 0  ? insulin glargine (LANTUS) 100 UNIT/ML injection Inject 0.11 mLs (11 Units total) into the skin at bedtime. 10 mL 3  ? levothyroxine (SYNTHROID) 112 MCG tablet Take 1 tablet (112 mcg total) by mouth daily. 90 tablet 1  ? metoprolol tartrate (LOPRESSOR) 25 MG tablet Take 1 tablet (25 mg total) by mouth 2 (two) times daily. 180 tablet 2  ? Multiple Vitamin (MULTIVITAMIN) tablet Take 1 tablet by mouth daily. For breast and bone health    ? ondansetron (ZOFRAN) 4 MG tablet Take 1 tablet (4 mg total) by mouth every 8 (eight) hours as needed for nausea or vomiting. 20 tablet 0  ? potassium chloride SA (KLOR-CON M) 20 MEQ tablet Take 1 tablet (20 mEq total) by mouth 2 (two) times daily. 60 tablet 2  ? simvastatin (ZOCOR) 20 MG tablet Take 1 tablet (  20 mg total) by mouth at bedtime. 90 tablet 1  ? ?No current facility-administered medications for this visit.  ? ? ?PHYSICAL EXAMINATION: ?ECOG PERFORMANCE STATUS: {CHL ONC ECOG VX:[id number]} ? ?There were no vitals filed for this visit. ?There were no vitals filed for this visit. ? ?BREAST:*** No palpable masses or nodules in either right or left breasts. No palpable axillary supraclavicular or infraclavicular adenopathy no breast tenderness or nipple discharge. (exam performed in the  presence of a chaperone) ? ?LABORATORY DATA:  ?I have reviewed the data as listed ? ?  Latest Ref Rng & Units 09/24/2021  ? 12:47 AM 09/23/2021  ?  1:26 AM 09/22/2021  ? 12:43 AM  ?CMP  ?Glucose 70 - 99 mg/dL 155   212   247    ?BUN 8 - 23 mg/dL $Remove'10   13   17    'zwBmXiE$ ?Creatinine 0.44 - 1.00 mg/dL 0.86   0.88   0.91    ?Sodium 135 - 145 mmol/L 128   126   123    ?Potassium 3.5 - 5.1 mmol/L 3.4   3.5   3.6    ?Chloride 98 - 111 mmol/L 99   98   94    ?CO2 22 - 32 mmol/L $RemoveB'21   20   19    'AuVweIpa$ ?Calcium 8.9 - 10.3 mg/dL 9.4   8.9   9.3    ?Total Protein 6.5 - 8.1 g/dL 6.5   5.9   6.4    ?Total Bilirubin 0.3 - 1.2 mg/dL 0.7   0.5   0.8    ?Alkaline Phos 38 - 126 U/L 70   65   67    ?AST 15 - 41 U/L $Remo'20   17   21    'LdKwX$ ?ALT 0 - 44 U/L $Remo'14   14   14    'ASehc$ ? ? ?Lab Results  ?Component Value Date  ? WBC 9.6 09/24/2021  ? HGB 11.3 (L) 09/24/2021  ? HCT 32.5 (L) 09/24/2021  ? MCV 88.3 09/24/2021  ? PLT 279 09/24/2021  ? NEUTROABS 6.8 09/24/2021  ? ? ?ASSESSMENT & PLAN:  ?No problem-specific Assessment & Plan notes found for this encounter. ? ? ? ?No orders of the defined types were placed in this encounter. ? ?The patient has a good understanding of the overall plan. she agrees with it. she will call with any problems that may develop before the next visit here. ?Total time spent: 30 mins including face to face time and time spent for planning, charting and co-ordination of care ? ? Suzzette Righter, CMA ?10/29/21 ? ? ? I Gardiner Coins am scribing for Dr. Lindi Adie ? ?***  ?

## 2021-10-30 ENCOUNTER — Inpatient Hospital Stay: Payer: Medicare PPO | Attending: Hematology and Oncology | Admitting: Hematology and Oncology

## 2021-10-30 NOTE — Assessment & Plan Note (Deleted)
history of Stage?IA?left breast?invasive lobular carcinoma, ER+/PR+/HER2-, diagnosed in?08/2011, treated with lumpectomy,?unable to tolerate Tamoxifen, and declined adjuvant radiation ?? ?Breast Cancer Surveillance: ?1. Breast exam?10/30/21:?Benign ?2. Mammogram?03/06/2021?no abnormalities. Postsurgical changes. Breast Density Category C.??Patient needs another mammogram this year. ?? ?Chronic back pain: Sees orthopedics ?Hypothyroidism: Sees endocrinology ?MRI of the abdomen: Findings consistent with lesion on right kidney has concerning findings that this could be renal cell cancer.  Patient will need to be referred to urology. ?? ? ?RTC in 1 year ?

## 2021-10-31 ENCOUNTER — Telehealth: Payer: Self-pay | Admitting: Internal Medicine

## 2021-10-31 NOTE — Telephone Encounter (Signed)
[name] with [name] calls today in regards to PT's wellness. PT has been dealing with loose dark stools and [name] would like to have an order put in for collecting a sample for hemoccult. [name] is fine with it being a verbal order! ? ?CB: [phone] ?

## 2021-10-31 NOTE — Telephone Encounter (Signed)
Verbals given to [place] today ?

## 2021-10-31 NOTE — Telephone Encounter (Signed)
Okay for orders? 

## 2021-11-04 ENCOUNTER — Telehealth: Payer: Self-pay | Admitting: Internal Medicine

## 2021-11-04 NOTE — Telephone Encounter (Signed)
Stool test was negative for blood.

## 2021-11-05 NOTE — Telephone Encounter (Signed)
Message left for patient today with results. ?

## 2021-11-12 DIAGNOSIS — D49511 Neoplasm of unspecified behavior of right kidney: Secondary | ICD-10-CM | POA: Diagnosis not present

## 2021-11-14 DIAGNOSIS — E1065 Type 1 diabetes mellitus with hyperglycemia: Secondary | ICD-10-CM | POA: Diagnosis not present

## 2021-11-19 DIAGNOSIS — R197 Diarrhea, unspecified: Secondary | ICD-10-CM | POA: Diagnosis not present

## 2021-11-19 DIAGNOSIS — I10 Essential (primary) hypertension: Secondary | ICD-10-CM | POA: Diagnosis not present

## 2021-11-19 DIAGNOSIS — E1065 Type 1 diabetes mellitus with hyperglycemia: Secondary | ICD-10-CM | POA: Diagnosis not present

## 2021-11-19 DIAGNOSIS — E7800 Pure hypercholesterolemia, unspecified: Secondary | ICD-10-CM | POA: Diagnosis not present

## 2021-11-19 DIAGNOSIS — E039 Hypothyroidism, unspecified: Secondary | ICD-10-CM | POA: Diagnosis not present

## 2021-11-19 DIAGNOSIS — M810 Age-related osteoporosis without current pathological fracture: Secondary | ICD-10-CM | POA: Diagnosis not present

## 2021-11-19 DIAGNOSIS — S32010A Wedge compression fracture of first lumbar vertebra, initial encounter for closed fracture: Secondary | ICD-10-CM | POA: Diagnosis not present

## 2021-11-21 ENCOUNTER — Encounter: Payer: Self-pay | Admitting: Internal Medicine

## 2021-11-21 ENCOUNTER — Telehealth (INDEPENDENT_AMBULATORY_CARE_PROVIDER_SITE_OTHER): Payer: Medicare PPO | Admitting: Internal Medicine

## 2021-11-21 DIAGNOSIS — M549 Dorsalgia, unspecified: Secondary | ICD-10-CM | POA: Diagnosis not present

## 2021-11-21 DIAGNOSIS — R197 Diarrhea, unspecified: Secondary | ICD-10-CM | POA: Diagnosis not present

## 2021-11-21 DIAGNOSIS — N2889 Other specified disorders of kidney and ureter: Secondary | ICD-10-CM | POA: Diagnosis not present

## 2021-11-21 DIAGNOSIS — Z20828 Contact with and (suspected) exposure to other viral communicable diseases: Secondary | ICD-10-CM | POA: Diagnosis not present

## 2021-11-21 DIAGNOSIS — J209 Acute bronchitis, unspecified: Secondary | ICD-10-CM | POA: Diagnosis not present

## 2021-11-21 DIAGNOSIS — G8929 Other chronic pain: Secondary | ICD-10-CM

## 2021-11-21 NOTE — Assessment & Plan Note (Signed)
Discussed with her at length that the CT, MRI and urine cytology all are very highly suggestive of cancer, but doing the cystoscopy and uteroscopy will help confirm that-that is the only way to confirm that. ?Discussed that if this is cancer it is not something that we want to wait long on doing and then she may need surgery ?Discussed that seeing a different urologist will likely have the same opinion, but she wants to be referred ?Referral ordered for [facility] urology ?She understands the urgency with getting this treated as soon as possible ?

## 2021-11-21 NOTE — Assessment & Plan Note (Signed)
Subacute ?Having diarrhea for the past 2 months or so ?Typically going twice a day, yesterday went 4 times a day-nonbloody, no black stools recently, but was guaiac positive a couple of months ago ?No abdominal pain, nausea, heartburn ?Has always had chronic constipation, but often has had diarrhea related to chronic constipation ?Restart Metamucil to see if that helps ?Advised her not to take any over-the-counter medications ?She will call if her diarrhea does not improve ?

## 2021-11-21 NOTE — Assessment & Plan Note (Signed)
She continues to have left-sided back pain ?She was very confused why left-sided back pain was hurting and kept thinking the urologist was the one who is going to figure that out-discussed that the back pain is likely musculoskeletal and separate from the renal mass ?Advised that she will need to see her orthopedic regarding the left back pain ?Symptomatic treatment for now ?

## 2021-11-21 NOTE — Progress Notes (Signed)
Virtual Visit via telephone Note ? ?I connected with [name] on 11/21/21 at  2:20 PM EDT by a video enabled telemedicine application and verified that I am speaking with the correct person using two identifiers. ?  ?I discussed the limitations of evaluation and management by telemedicine and the availability of in person appointments. The patient expressed understanding and agreed to proceed. ? ?Present for the visit:  Myself, Dr Billey Gosling, [name].  The patient is currently at home and I am in the office.   ? ?No referring provider.  ? ? ?History of Present Illness: ?She is here for an acute visit for diarrhea, back pain, renal mass.   ? ? ?Diarrhea: She has had diarrhea for at least 2 months.  Yesterday she had diarrhea 4 times. It is usually 2 times a day.  It sprays all over then she goes.  She denies abdominal pain, nausea or heartburn.  No normal BM.  In the past couple of months she has only had 1 day without having a bowel movement.  Initially she was having black stool and did have guaiac positive stools at 1 point.  She states her stools are now back to normal color.  She did purchase some over-the-counter medication for diarrhea, but was afraid to take it.  She does have a long history of constipation and has had diarrhea related to that.  In the past she used to take Metamucil, but is no longer taking that. ? ?Dr Chalmers Cater for DM, hypothyroidism.  She did go for her first visit. ? ?Chronic back pain on her left side.  She continues to have chronic pain on her left side. ? ?Right renal mass: She is seeing urology.  She is not happy with what they are recommending and she think she wants a second opinion.  She has had an MRI, CT scan and neuro cytology that all highly suggest renal cancer.  The urologist wants to do a cystoscopy and ureteroscopy and possibly later surgery.  She is now understanding why they cannot confirm its cancer.  She is really not wanting to go ahead and do surgery without  knowing.   ? ? ?Social History  ? ?Socioeconomic History  ? Marital status: Married  ?  Spouse name: Not on file  ? Number of children: Not on file  ? Years of education: Not on file  ? Highest education level: Not on file  ?Occupational History  ? Not on file  ?Tobacco Use  ? Smoking status: Former  ?  Packs/day: 1.00  ?  Years: 10.00  ?  Pack years: 10.00  ?  Types: Cigarettes  ?  Quit date: 08/27/1977  ?  Years since quitting: 44.2  ? Smokeless tobacco: Never  ?Vaping Use  ? Vaping Use: Never used  ?Substance and Sexual Activity  ? Alcohol use: Yes  ?  Comment: rare  ? Drug use: No  ? Sexual activity: Yes  ?  Birth control/protection: Post-menopausal  ?Other Topics Concern  ? Not on file  ?Social History Narrative  ? ** Merged History Encounter **  ?    ? ?Social Determinants of Health  ? ?Financial Resource Strain: Not on file  ?Food Insecurity: Not on file  ?Transportation Needs: Not on file  ?Physical Activity: Not on file  ?Stress: Not on file  ?Social Connections: Not on file  ? ?  ?Observations/Objective: ? ?MR ABDOMEN W WO CONTRAST ?CLINICAL DATA:  88 year old female with history of indeterminate ?  lesion noted in the right kidney on prior CT examinations. Follow-up ?study. ? ?EXAM: ?MRI ABDOMEN WITHOUT AND WITH CONTRAST ? ?TECHNIQUE: ?Multiplanar multisequence MR imaging of the abdomen was performed ?both before and after the administration of intravenous contrast. ? ?CONTRAST:  74m GADAVIST GADOBUTROL 1 MMOL/ML IV SOLN ? ?COMPARISON:  No prior abdominal MRI. CT the abdomen and pelvis ?09/12/2021. ? ?FINDINGS: ?Lower chest: Unremarkable. ? ?Hepatobiliary: No suspicious cystic or solid hepatic lesions. No ?intra or extrahepatic biliary ductal dilatation. Gallbladder is ?normal in appearance. ? ?Pancreas: No pancreatic mass. No pancreatic ductal dilatation. No ?pancreatic or peripancreatic fluid collections or inflammatory ?changes. ? ?Spleen:  Unremarkable. ? 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 Left kidney and bilateral adrenal glands are ?normal in appearance. No hydroureteronephrosis in the visualized ?portions of the abdomen. ? ?Stomach/Bowel: Visualized portions are unremarkable. ? ?Vascular/Lymphatic: No aneurysm identified in the visualized ?abdominal vasculature. No lymphadenopathy noted in the abdomen. ? ?Other: No significant volume of ascites noted in the visualized ?portions of the peritoneal cavity. ? ?Musculoskeletal: No aggressive appearing osseous lesions are noted ?in the visualized portions of the skeleton. ? ?IMPRESSION: ?1. The lesion of concern in the upper pole of the right kidney has ?imaging characteristics highly concerning for upper tract urothelial ?neoplasm. Referral to Urology for further clinical evaluation is ?strongly recommended. ?2. No definite lymphadenopathy or signs of metastatic disease in the ?abdomen. ? ?These results will be called to the ordering clinician or ?representative by the Radiologist Assistant, and communication ?documented in the PACS or [employer] ? ?Electronically Signed ?  By: DVinnie LangtonM.D. ?  On: 09/18/2021 08:11 ? ? ? ?Assessment and Plan: ? ?See Problem List for Assessment and Plan of chronic medical problems. ? ? ?Follow Up Instructions: ? ?  ?I discussed the assessment and treatment plan with the patient. The patient was provided an opportunity to ask questions and all were answered. The patient agreed with the plan and demonstrated an  understanding of the instructions. ?  ?The patient was advised to call back or seek an in-person evaluation if the symptoms worsen or if the condition fails to improve as anticipated. ? ?Time spent on telephone call -25 minutes ? ? ?SBinnie Rail MD ? ?

## 2021-11-27 ENCOUNTER — Other Ambulatory Visit: Payer: Self-pay | Admitting: Internal Medicine

## 2021-12-01 ENCOUNTER — Encounter: Payer: Self-pay | Admitting: Internal Medicine

## 2021-12-01 ENCOUNTER — Telehealth: Payer: Self-pay | Admitting: Internal Medicine

## 2021-12-01 ENCOUNTER — Other Ambulatory Visit: Payer: Self-pay

## 2021-12-01 ENCOUNTER — Ambulatory Visit: Payer: Medicare PPO | Admitting: Internal Medicine

## 2021-12-01 VITALS — BP 136/84 | HR 75 | Temp 97.9°F | Ht 67.0 in | Wt 106.8 lb

## 2021-12-01 DIAGNOSIS — R300 Dysuria: Secondary | ICD-10-CM

## 2021-12-01 DIAGNOSIS — N2889 Other specified disorders of kidney and ureter: Secondary | ICD-10-CM

## 2021-12-01 DIAGNOSIS — N3001 Acute cystitis with hematuria: Secondary | ICD-10-CM | POA: Diagnosis not present

## 2021-12-01 DIAGNOSIS — I10 Essential (primary) hypertension: Secondary | ICD-10-CM

## 2021-12-01 LAB — POC URINALSYSI DIPSTICK (AUTOMATED)
Bilirubin, UA: NEGATIVE
Glucose, UA: POSITIVE — AB
Ketones, UA: NEGATIVE
Nitrite, UA: POSITIVE
Protein, UA: POSITIVE — AB
Spec Grav, UA: 1.02 (ref 1.010–1.025)
Urobilinogen, UA: NEGATIVE E.U./dL — AB
pH, UA: 6 (ref 5.0–8.0)

## 2021-12-01 MED ORDER — NITROFURANTOIN MONOHYD MACRO 100 MG PO CAPS: 100.0000 mg | ORAL_CAPSULE | Freq: Two times a day (BID) | ORAL | 0 refills | Status: DC

## 2021-12-01 NOTE — Progress Notes (Signed)
? ? ?Subjective:  ? ? Patient ID: [name], female    DOB: Sep 10, 1928, 88 y.o.   MRN: [id number] ? ?This visit occurred during the SARS-CoV-2 public health emergency.  Safety protocols were in place, including screening questions prior to the visit, additional usage of staff PPE, and extensive cleaning of exam room while observing appropriate contact time as indicated for disinfecting solutions. ? ? ? ?HPI ?[name] is here for  ?Chief Complaint  ?Patient presents with  ? Urinary Tract Infection  ?  Urine frequency with pressure; blood noted in urine  ? ? ?Gross hematuria-  last night she felt a little pressure in her bladder and looked at her urine and it was black.  She states she has not been looking at her urine the past couple of weeks.  She went to the ED, but there was a lot of people there so she did not stay.  She had a lot of pressure in her abdomen all last night.  She did put something frozen on the outside of her vagina, which seemed to help with some of the pain.  She got up several times last night.  Her urine is still discolored but not as dark as it was-it was more of an orange color when she had a sample here today..  She has had some headaches, lightheadedness and overall does not feel well. ? ?She knows she has a urinary tract infection. ? ? ? ?Medications and allergies reviewed with patient and updated if appropriate. ? ?Current Outpatient Medications on File Prior to Visit  ?Medication Sig Dispense Refill  ? aspirin EC 81 MG tablet Take 81 mg by mouth daily.    ? B Complex-C (SUPER B COMPLEX PO) Take 1 tablet by mouth daily.    ? BD VEO INSULIN SYRINGE U/F 31G X 15/64" 0.3 ML MISC USE WITH INJECTIONS 100 each 2  ? bisacodyl (DULCOLAX) 10 MG suppository Place 1 suppository (10 mg total) rectally as needed for moderate constipation. 12 suppository 0  ? carboxymethylcellulose (REFRESH PLUS) 0.5 % SOLN Place 1 drop into both eyes 3 (three) times daily as needed (dry eyes). Non-preservative    ?  chlorhexidine (PERIDEX) 0.12 % solution Use as directed 15 mLs in the mouth or throat 2 (two) times daily as needed. 473 mL 1  ? cholecalciferol (VITAMIN D) 1000 UNITS tablet Take 5,000 Units by mouth daily.     ? Continuous Blood Gluc Sensor (FREESTYLE LIBRE 2 SENSOR) MISC See admin instructions.    ? denosumab (PROLIA) 60 MG/ML SOSY injection See admin instructions.    ? DUREZOL 0.05 % EMUL Place 1 drop into the right eye 3 (three) times daily.    ? fish oil-omega-3 fatty acids 1000 MG capsule Take 2 g by mouth daily.    ? HUMALOG KWIKPEN 100 UNIT/ML KwikPen USE AS DIRECTED MAX 35U/DAY 15 mL 3  ? insulin aspart (NOVOLOG) 100 UNIT/ML FlexPen Before each meal 3 times a day, 140-199 - 2 units, 200-250 - 4 units, 251-299 - 6 units,  300-349 - 8 units,  350 or above 10 units. Insulin PEN if approved, provide syringes and needles if needed. 15 mL 0  ? insulin glargine (LANTUS) 100 UNIT/ML injection Inject 0.11 mLs (11 Units total) into the skin at bedtime. 10 mL 3  ? levothyroxine (SYNTHROID) 112 MCG tablet Take 1 tablet (112 mcg total) by mouth daily. 90 tablet 1  ? metoprolol tartrate (LOPRESSOR) 25 MG tablet Take 1 tablet (25  mg total) by mouth 2 (two) times daily. 180 tablet 2  ? Multiple Vitamin (MULTIVITAMIN) tablet Take 1 tablet by mouth daily. For breast and bone health    ? ondansetron (ZOFRAN) 4 MG tablet Take 1 tablet (4 mg total) by mouth every 8 (eight) hours as needed for nausea or vomiting. 20 tablet 0  ? potassium chloride SA (KLOR-CON M) 20 MEQ tablet Take 1 tablet (20 mEq total) by mouth 2 (two) times daily. 60 tablet 2  ? simvastatin (ZOCOR) 20 MG tablet Take 1 tablet (20 mg total) by mouth at bedtime. 90 tablet 1  ? ?No current facility-administered medications on file prior to visit.  ? ? ?Review of Systems  ?Constitutional:  Negative for chills and fever.  ?Gastrointestinal:  Positive for abdominal pain (bladder/lower abdomen) and nausea.  ?Genitourinary:  Positive for dysuria, frequency, hematuria  and pelvic pain (bladder pain).  ?Neurological:  Positive for light-headedness and headaches.  ? ?   ?Objective:  ? ?Vitals:  ? 12/01/21 1403  ?BP: 136/84  ?Pulse: 75  ?Temp: 97.9 ?F (36.6 ?C)  ?SpO2: 95%  ? ?BP Readings from Last 3 Encounters:  ?12/01/21 136/84  ?10/14/21 130/72  ?09/24/21 (!) 137/59  ? ?Wt Readings from Last 3 Encounters:  ?12/01/21 106 lb 12.8 oz (48.4 kg)  ?09/17/21 117 lb 4.6 oz (53.2 kg)  ?08/19/21 120 lb (54.4 kg)  ? ?Body mass index is 16.73 kg/m?. ? ?  ?Physical Exam ?Constitutional:   ?   General: She is not in acute distress. ?   Appearance: Normal appearance. She is not ill-appearing.  ?HENT:  ?   Head: Normocephalic.  ?Eyes:  ?   Conjunctiva/sclera: Conjunctivae normal.  ?Abdominal:  ?   General: There is no distension.  ?   Palpations: Abdomen is soft.  ?   Tenderness: There is abdominal tenderness (Suprapubic region). There is no right CVA tenderness, left CVA tenderness, guarding or rebound.  ?Skin: ?   General: Skin is warm and dry.  ?Neurological:  ?   Mental Status: She is alert.  ? ?   ? ? ? ? ? ?Assessment & Plan:  ? ? ?See Problem List for Assessment and Plan of chronic medical problems.  ? ? ? ? ?

## 2021-12-01 NOTE — Assessment & Plan Note (Signed)
I did refer her to [facility] urology for second opinion-she has not heard from them so I did supply their number for her should call tomorrow to see if she can schedule an appointment ?Stressed that we need to have this treated as soon as possible, which she understands ?

## 2021-12-01 NOTE — Assessment & Plan Note (Signed)
Chronic Blood pressure well controlled Continue metoprolol 25 mg twice daily 

## 2021-12-01 NOTE — Telephone Encounter (Signed)
Okay for orders? 

## 2021-12-01 NOTE — Assessment & Plan Note (Signed)
Acute ?Urine dip consistent with UTI ?Will send urine for culture ?Take the antibiotic as prescribed.  Nitrofurantoin 100 mg twice daily x1 week ?Take tylenol if needed.   ?Increase your water intake.  ?Call if no improvement  ?

## 2021-12-01 NOTE — Telephone Encounter (Signed)
Colletta [place] called requesting verbals for Nursing.  ? ? ?2x a month, as well as 2 PRN.  ? ?CaB: [phone]  ?

## 2021-12-01 NOTE — Patient Instructions (Addendum)
? ?  [facility] in [place] [phone] ? ? ? ? ?Medications changes include :   nitrofurantoin twice daily x 1 week ? ? ?Your prescription(s) have been sent to your pharmacy.  ? ? ? ? ?Return if symptoms worsen or fail to improve. ? ?

## 2021-12-02 NOTE — Telephone Encounter (Signed)
Verbals given to Nambe today. ?

## 2021-12-03 ENCOUNTER — Telehealth: Payer: Self-pay

## 2021-12-03 MED ORDER — SULFAMETHOXAZOLE-TRIMETHOPRIM 800-160 MG PO TABS: 1.0000 | ORAL_TABLET | Freq: Two times a day (BID) | ORAL | 0 refills | Status: AC

## 2021-12-03 NOTE — Telephone Encounter (Signed)
Spoke with patient and sent in today. ?

## 2021-12-03 NOTE — Telephone Encounter (Signed)
We can try switching her to Bactrim which is a sulfa medication, which I do not think she is allergic to.  Medication pending if she agrees.  She should stop the nitrofurantoin and start this new medication.  Will give her 5 days worth since she is already completed some of the other antibiotic. ?

## 2021-12-03 NOTE — Telephone Encounter (Signed)
Pt is calling to report and upset stomach and also diarrhea after taking nitrofurantoin, macrocrystal-monohydrate, (MACROBID) 100 MG capsule. ?Pt is concerned and wanting to know if this is normal. ? ?Pt started on 4/24 and has 10 Cap remaining. ? ?Please advise ?

## 2021-12-06 IMAGING — DX DG THORACIC SPINE 2V
2 series · 2 of 2 positions shown · non-contrast
Comparison: August 16, 2019.

CLINICAL DATA: Flank pain.

EXAM:
THORACIC SPINE 2 VIEWS

[t-spine ap]
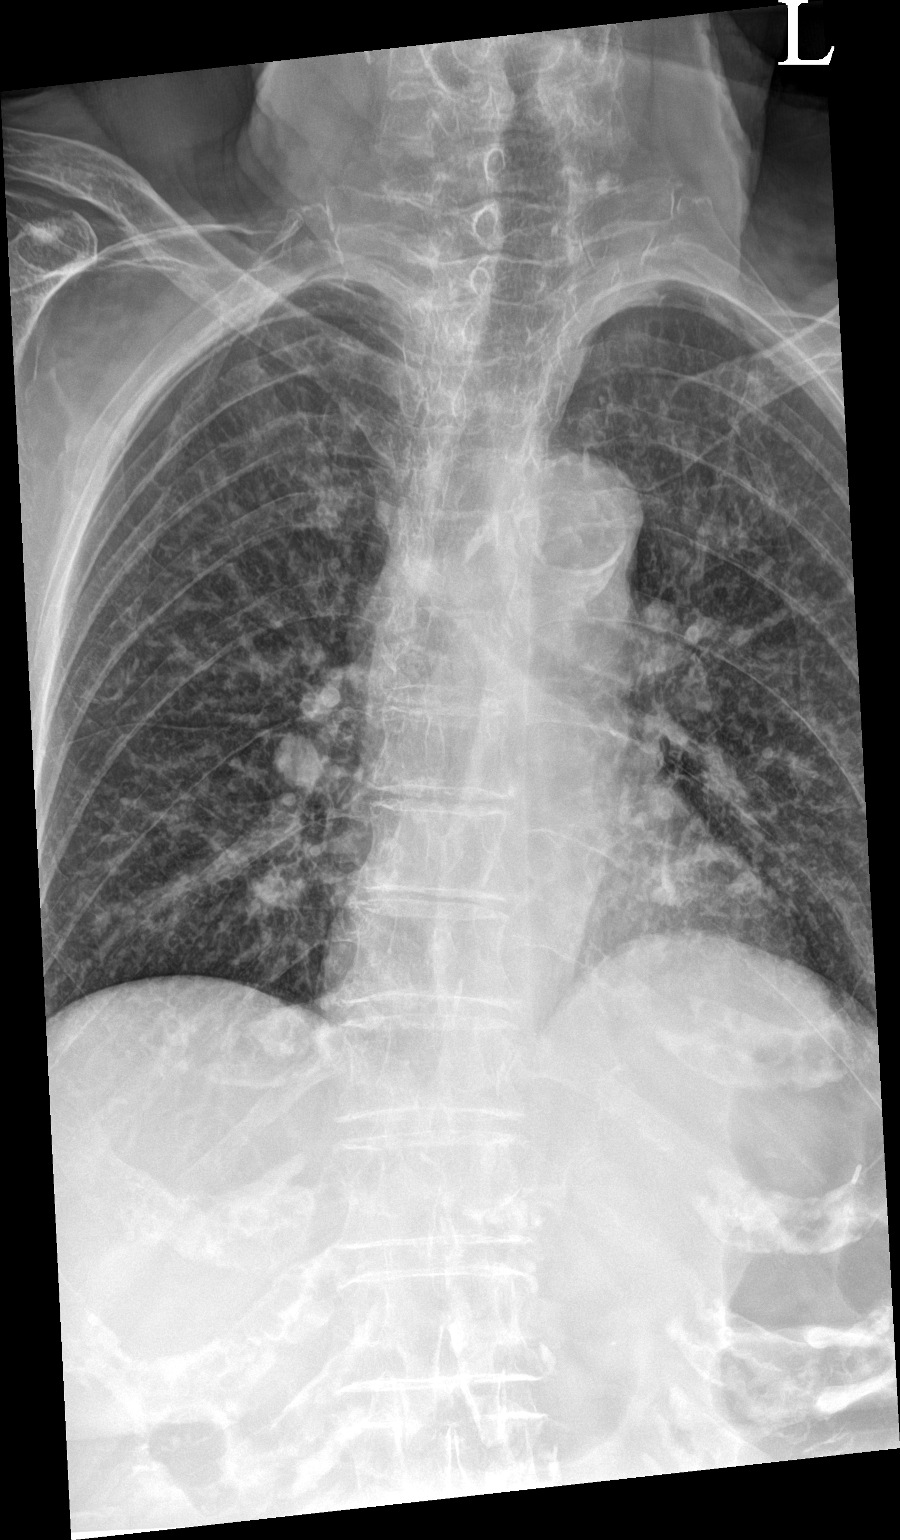

[t-spine lat]
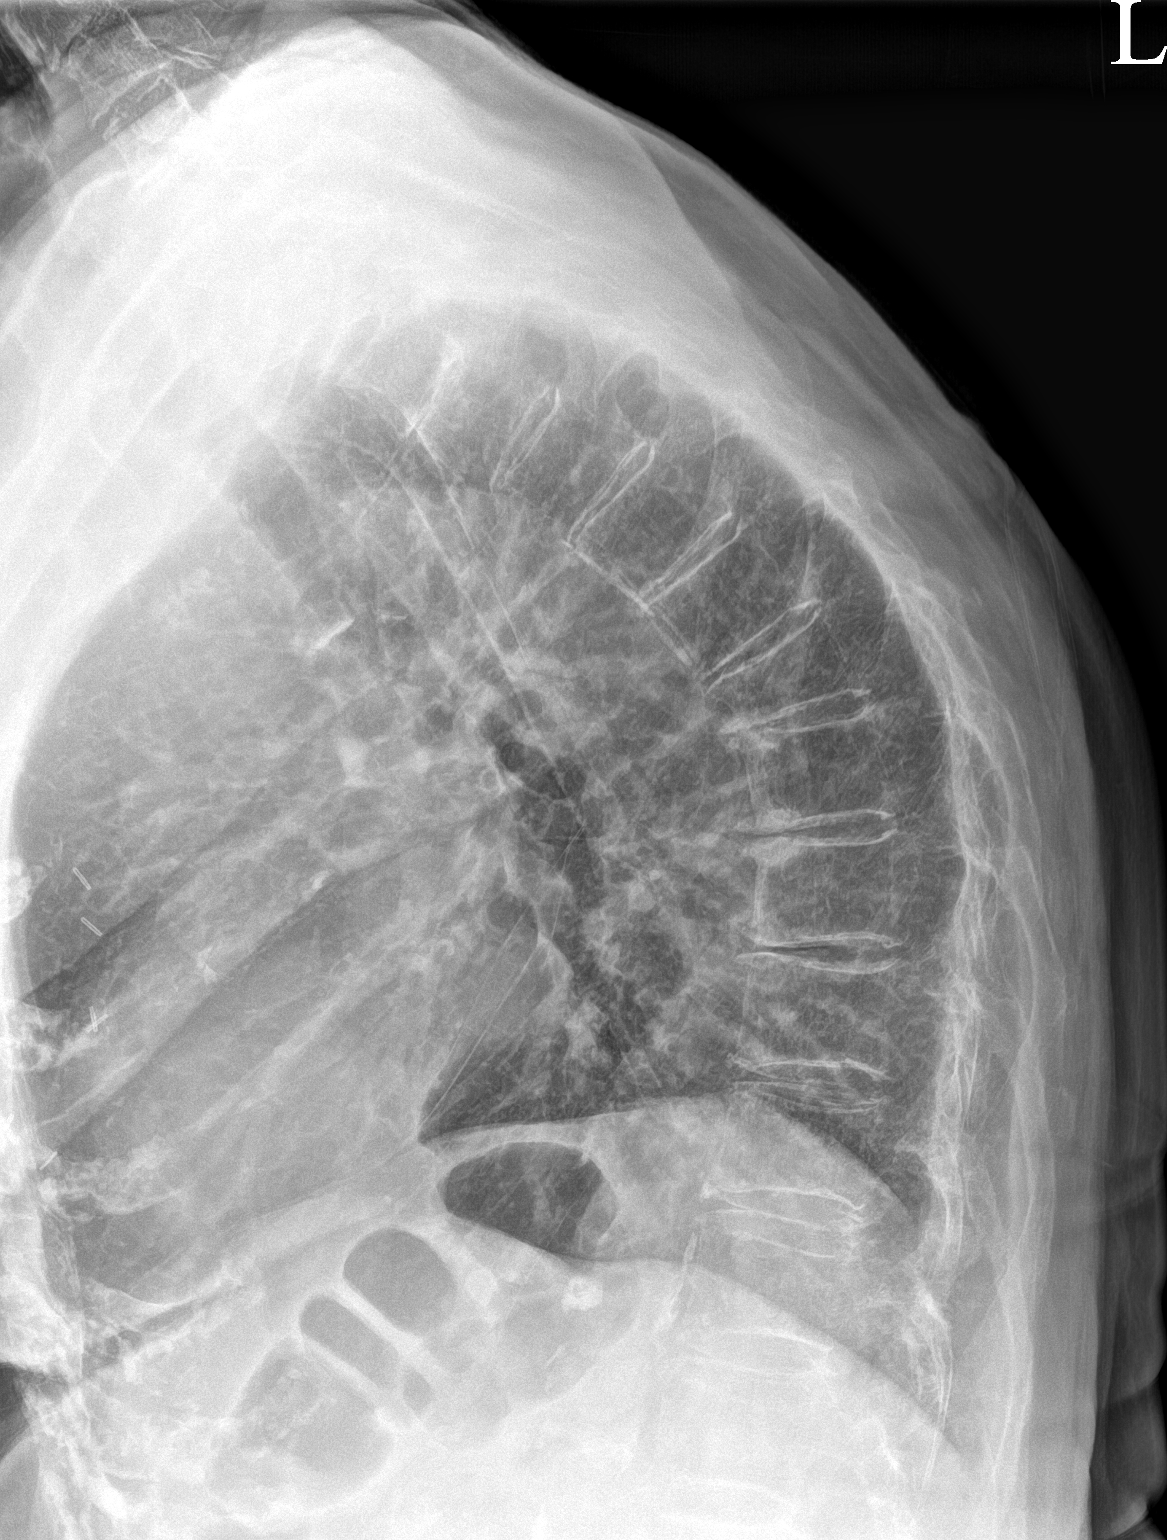

[2 of 2 positions shown; findings below may reference images not displayed]

FINDINGS: There is interval development of moderate compression deformity
involving midthoracic vertebral body concerning for fracture of
indeterminate age. No spondylolisthesis is noted.
IMPRESSION: Interval development of moderate compression deformity involving
midthoracic vertebral body concerning for fracture of indeterminate
age. MRI may be performed for further evaluation.

## 2021-12-06 IMAGING — DX DG LUMBAR SPINE COMPLETE 4+V
5 series · 5 of 5 positions shown · non-contrast
Comparison: June 30, 2021.

CLINICAL DATA: Flank pain.

EXAM:
LUMBAR SPINE - COMPLETE 4+ VIEW

[l-spine ap]
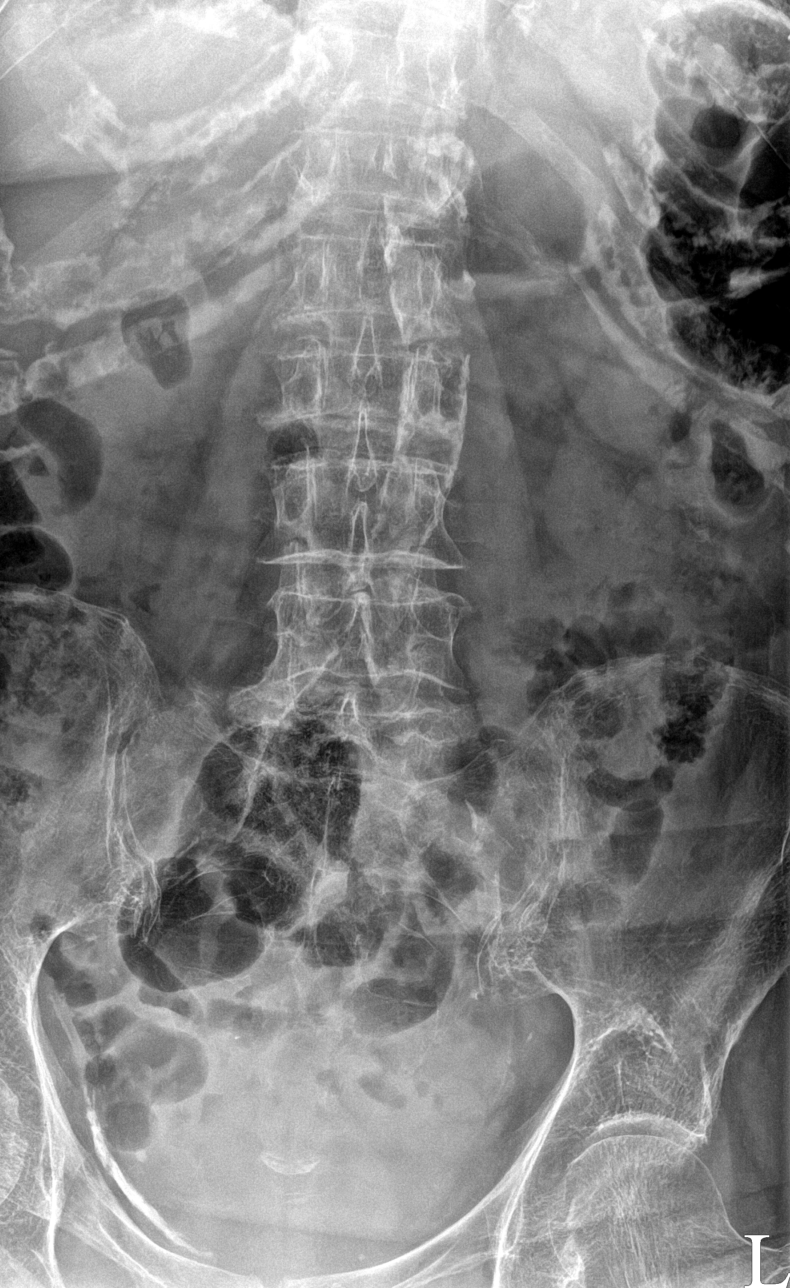

[l-spine obl (1 of 2)]
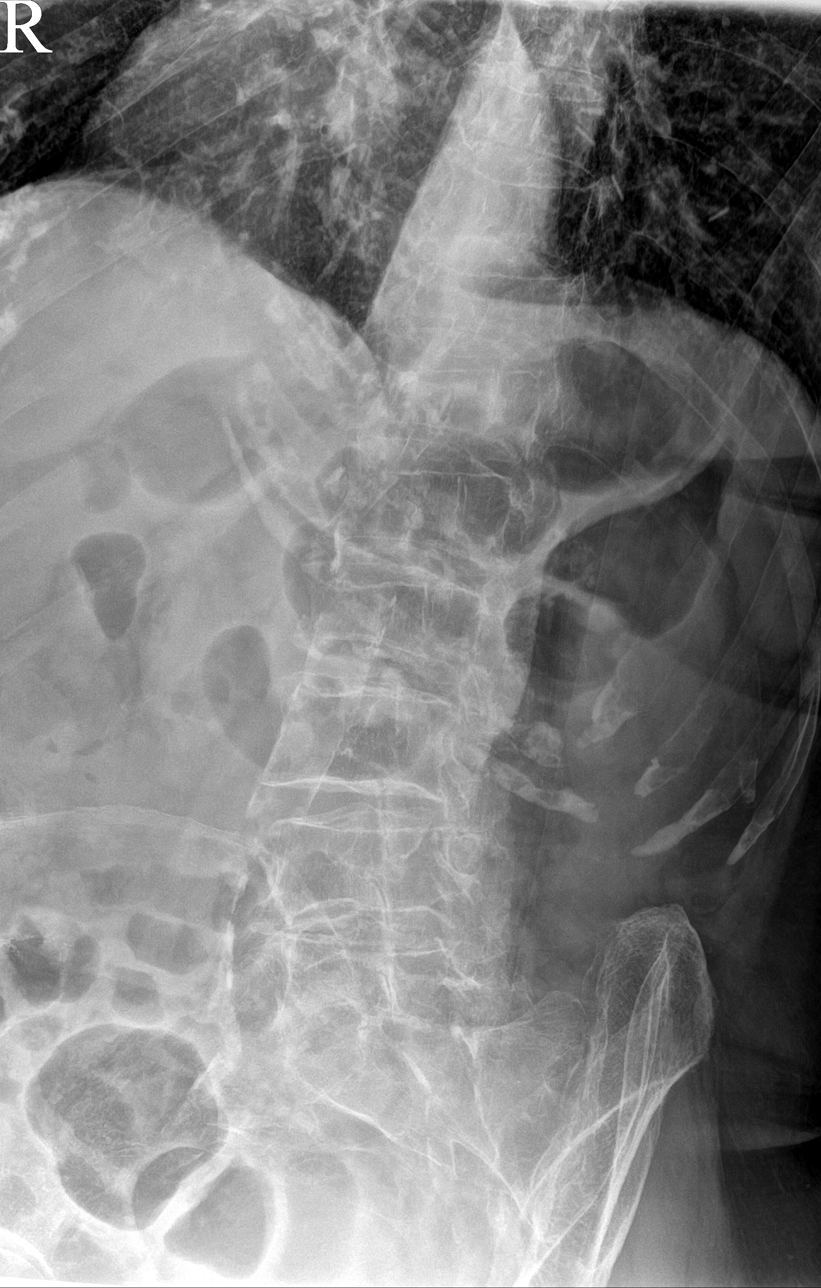

[l-spine obl (2 of 2)]
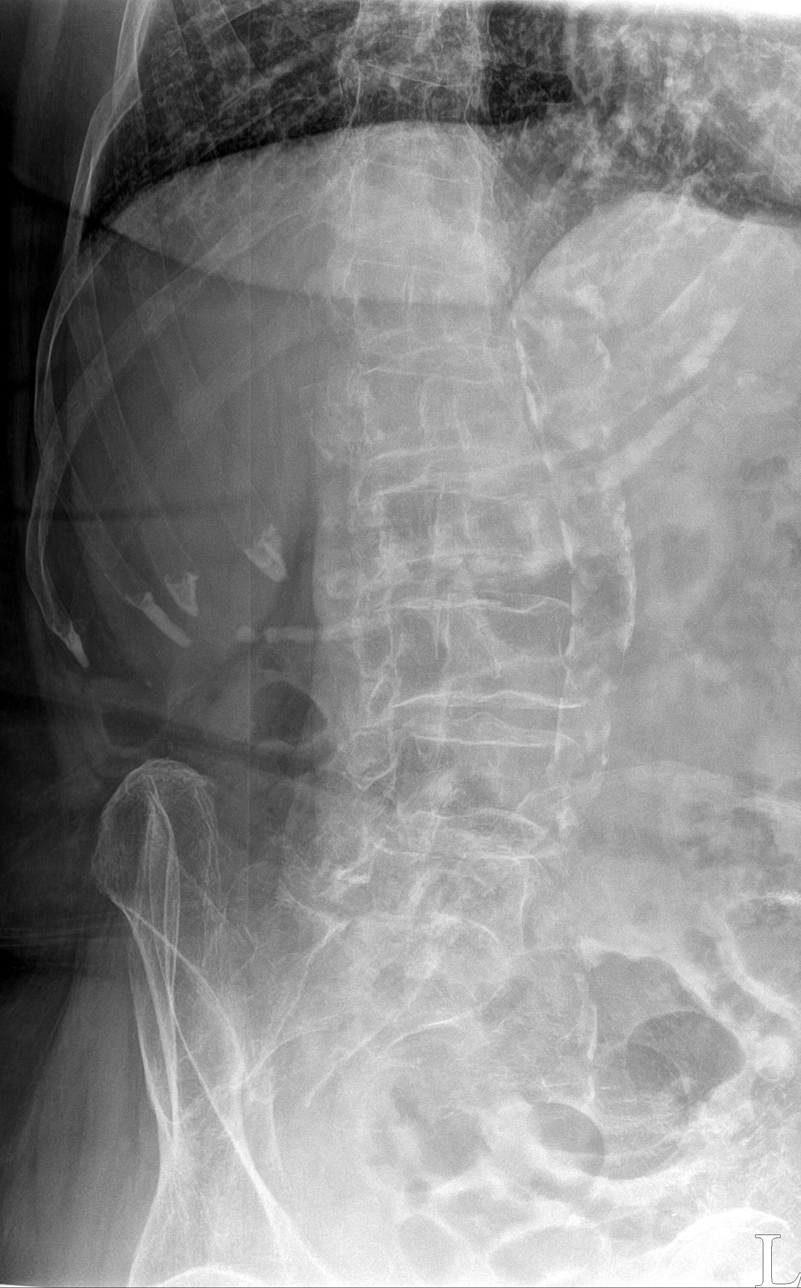

[l-spine lateral]
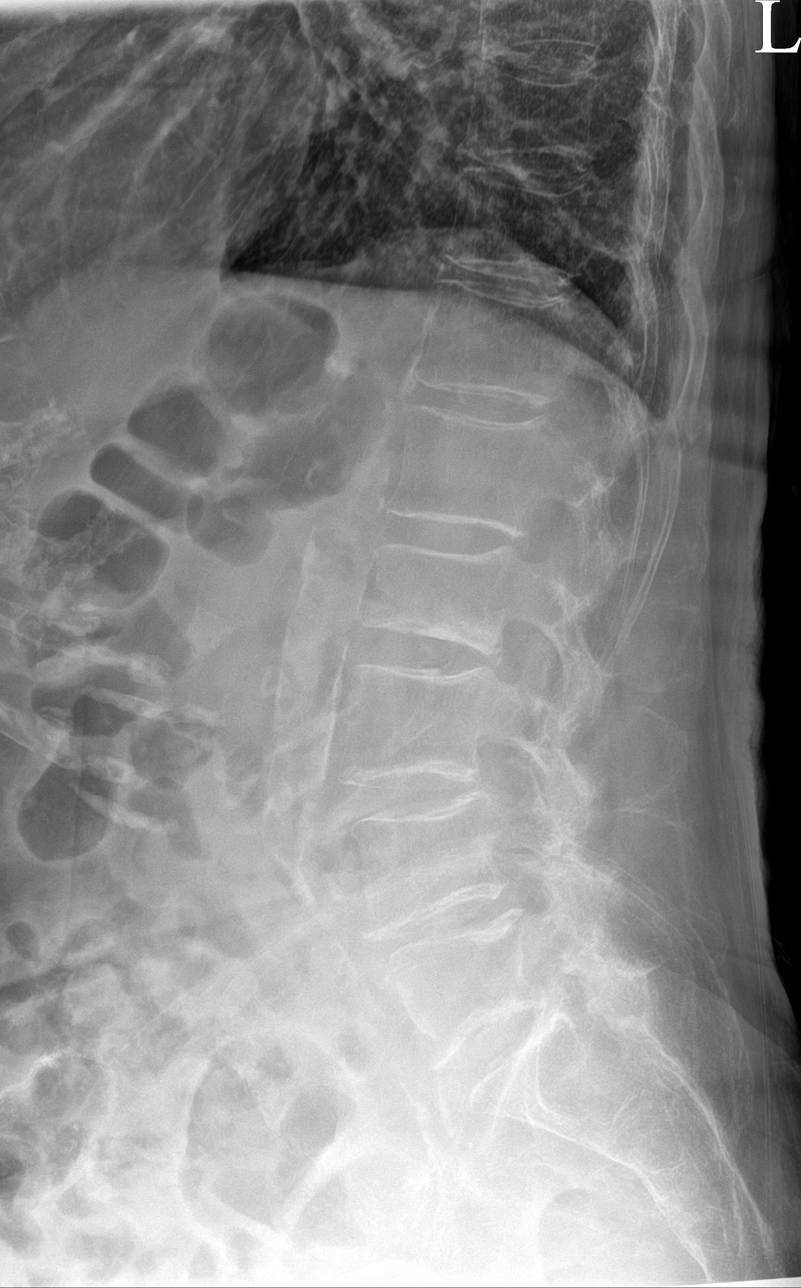

[l-spine spot]
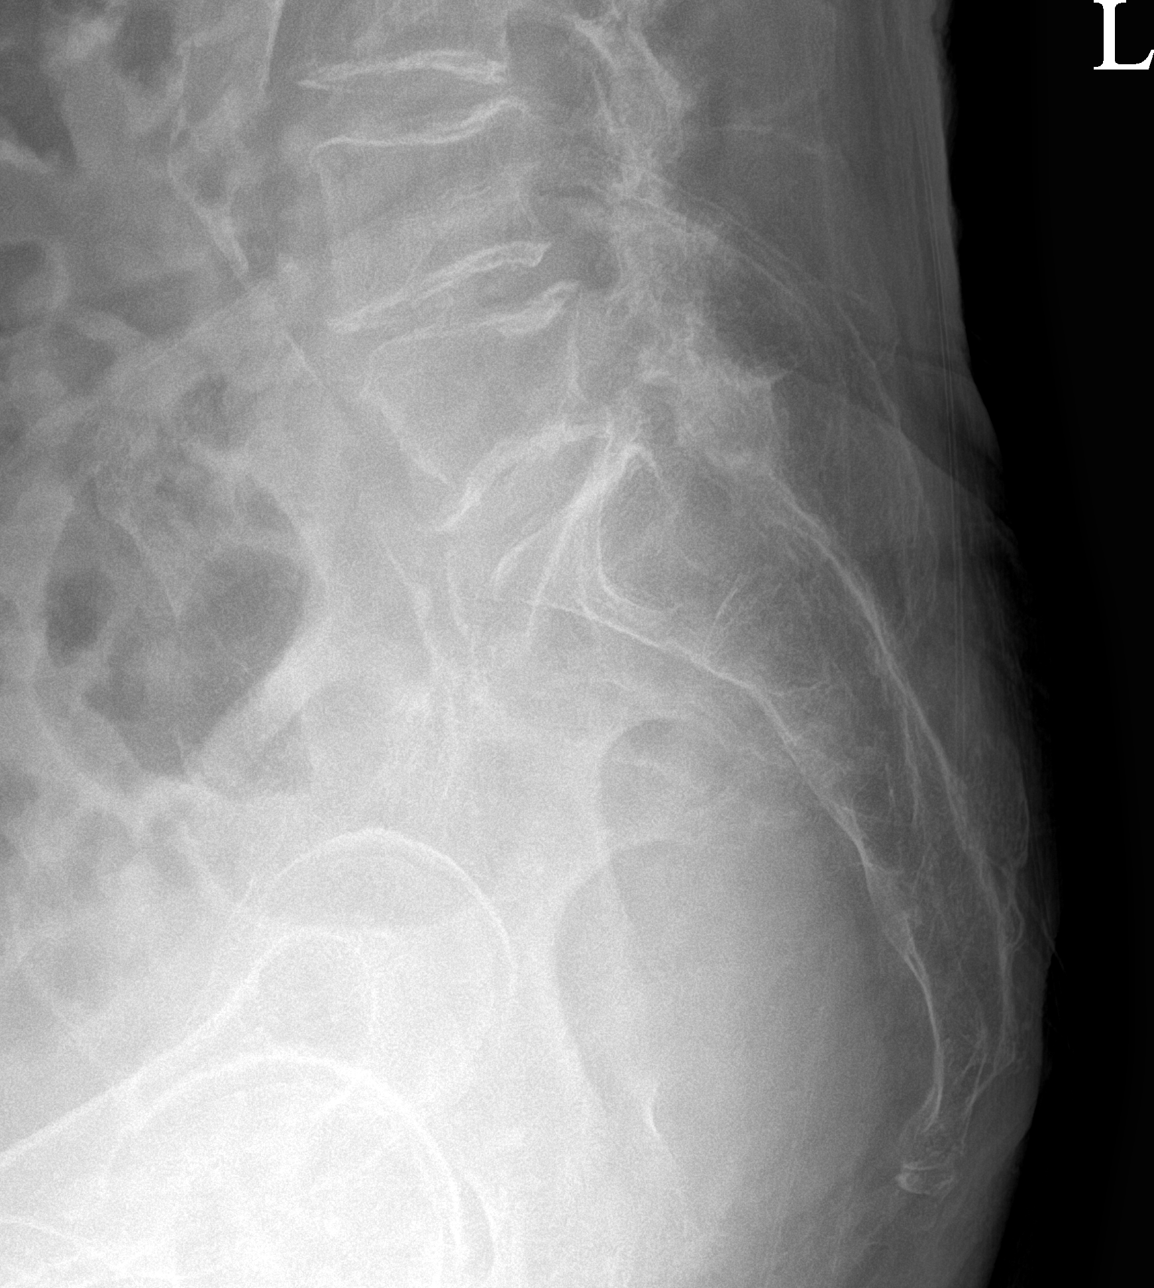

[5 of 5 positions shown; findings below may reference images not displayed]

FINDINGS: There appears to be mildly increased compression deformity of L2
vertebral body concerning for subacute fracture. No significant
spondylolisthesis is noted. Moderate degenerative disc disease is
noted at L4-5.
IMPRESSION: Possible mildly increased compression deformity of L2 vertebral body
concerning for subacute fracture. MRI may be performed for further
evaluation.

Aortic Atherosclerosis ([id number]-TJ2.2).

## 2021-12-09 ENCOUNTER — Ambulatory Visit (INDEPENDENT_AMBULATORY_CARE_PROVIDER_SITE_OTHER): Payer: Medicare PPO

## 2021-12-09 ENCOUNTER — Encounter: Payer: Self-pay | Admitting: Internal Medicine

## 2021-12-09 ENCOUNTER — Ambulatory Visit: Payer: Medicare PPO | Admitting: Internal Medicine

## 2021-12-09 VITALS — BP 130/82 | HR 91 | Temp 98.1°F | Ht 67.0 in | Wt 105.0 lb

## 2021-12-09 DIAGNOSIS — R197 Diarrhea, unspecified: Secondary | ICD-10-CM

## 2021-12-09 DIAGNOSIS — I10 Essential (primary) hypertension: Secondary | ICD-10-CM | POA: Diagnosis not present

## 2021-12-09 DIAGNOSIS — N3001 Acute cystitis with hematuria: Secondary | ICD-10-CM

## 2021-12-09 DIAGNOSIS — R1030 Lower abdominal pain, unspecified: Secondary | ICD-10-CM | POA: Diagnosis not present

## 2021-12-09 DIAGNOSIS — R109 Unspecified abdominal pain: Secondary | ICD-10-CM | POA: Diagnosis not present

## 2021-12-09 LAB — POC URINALSYSI DIPSTICK (AUTOMATED)
Bilirubin, UA: NEGATIVE
Glucose, UA: NEGATIVE
Ketones, UA: NEGATIVE
Nitrite, UA: POSITIVE
Protein, UA: POSITIVE — AB
Spec Grav, UA: 1.015 (ref 1.010–1.025)
Urobilinogen, UA: 0.2 E.U./dL
pH, UA: 6 (ref 5.0–8.0)

## 2021-12-09 MED ORDER — SULFAMETHOXAZOLE-TRIMETHOPRIM 800-160 MG PO TABS: 1.0000 | ORAL_TABLET | Freq: Two times a day (BID) | ORAL | 0 refills | Status: DC

## 2021-12-09 NOTE — Assessment & Plan Note (Signed)
Chronic Blood pressure well controlled Continue metoprolol 25 mg twice daily 

## 2021-12-09 NOTE — Patient Instructions (Addendum)
? ? ?  Have an xray downstairs.    ? ? ?Start taking metamucil twice daily until your bowel movements become regular.   ? ?Start Bactrim DS twice daily for 5 days for your infection. ? ?You can take the anti-nausea medication if needed. ? ? ?Your prescription(s) have been sent to your pharmacy.  ? ? ? ?Return if symptoms worsen or fail to improve. ? ?

## 2021-12-09 NOTE — Assessment & Plan Note (Signed)
Subacute ?Has fairly diffuse abdominal pain on exam, but initially thought it was just across her lower abdomen ?Does have what looks like a UTI, which is likely contributing to some of her abdominal pain, but also is likely constipated-she has an extensive history of diarrhea secondary to severe constipation ?Start Metamucil twice daily-advised that she needs to take the powder not the Gummies ?KUB today ?If no improvement will need CT ?

## 2021-12-09 NOTE — Assessment & Plan Note (Signed)
Acute ?Urine dip consistent with UTI ?Will send urine for culture ?Take the antibiotic as prescribed.  Bactrim DS twice daily x5 days-this should be effective based on her last cultures and it is not in her allergy list, but she is very sensitive to antibiotics-she will call if she has any side effects ?Take tylenol if needed.   ?Increase your water intake.  ?Call if no improvement  ?  ? ?

## 2021-12-09 NOTE — Assessment & Plan Note (Signed)
Subacute ?Having diarrhea for a while now typically 3 episodes a day ?She states that very small amount of stool ?Has diffuse abdominal pain and the course of the colon is tender to palpation so I am concerned that she has constipation ?She did restart Metamucil, but is taking the Gummies and advised her that she needs to switch to powder and start twice daily until her bowel movements become more normal ?Also has UTI which could be contributing to some of her abdominal pain/diarrhea-she has been on and off antibiotics and we may need to check it stool sample although she is going so little I doubt this is C. difficile ?CT of the abdomen and stool studies if there is no improvement with Metamucil and antibiotics for UTI ?

## 2021-12-09 NOTE — Progress Notes (Signed)
? ? ?Subjective:  ? ? Patient ID: [name], female    DOB: 1928-09-12, 88 y.o.   MRN: [id number] ? ?This visit occurred during the SARS-CoV-2 public health emergency.  Safety protocols were in place, including screening questions prior to the visit, additional usage of staff PPE, and extensive cleaning of exam room while observing appropriate contact time as indicated for disinfecting solutions. ? ? ? ?HPI ?[name] is here for  ?Chief Complaint  ?Patient presents with  ? Urinary Tract Infection  ? ? ? ? ?Diarrhea-she continues to have diarrhea.  She is having 3 episodes of diarrhea a day - just a small amount.  She denies any blood, but it does look dark.  She has abdominal pain - lower abdomen.  She has bladder pressure and pain when she urinates.  No blood in the urine.  She knows she has a urinary tract infection.  She denies any fevers. ? ?She is taking fiber gummies w/o change in her bowels since her last visit.  ? [facility] on 12/22/21.   ? ? ?Medications and allergies reviewed with patient and updated if appropriate. ? ?Current Outpatient Medications on File Prior to Visit  ?Medication Sig Dispense Refill  ? aspirin EC 81 MG tablet Take 81 mg by mouth daily.    ? B Complex-C (SUPER B COMPLEX PO) Take 1 tablet by mouth daily.    ? BD VEO INSULIN SYRINGE U/F 31G X 15/64" 0.3 ML MISC USE WITH INJECTIONS 100 each 2  ? bisacodyl (DULCOLAX) 10 MG suppository Place 1 suppository (10 mg total) rectally as needed for moderate constipation. 12 suppository 0  ? carboxymethylcellulose (REFRESH PLUS) 0.5 % SOLN Place 1 drop into both eyes 3 (three) times daily as needed (dry eyes). Non-preservative    ? chlorhexidine (PERIDEX) 0.12 % solution Use as directed 15 mLs in the mouth or throat 2 (two) times daily as needed. 473 mL 1  ? cholecalciferol (VITAMIN D) 1000 UNITS tablet Take 5,000 Units by mouth daily.     ? Continuous Blood Gluc Sensor (FREESTYLE LIBRE 2 SENSOR) MISC See admin instructions.    ? denosumab  (PROLIA) 60 MG/ML SOSY injection See admin instructions.    ? DUREZOL 0.05 % EMUL Place 1 drop into the right eye 3 (three) times daily.    ? fish oil-omega-3 fatty acids 1000 MG capsule Take 2 g by mouth daily.    ? HUMALOG KWIKPEN 100 UNIT/ML KwikPen USE AS DIRECTED MAX 35U/DAY 15 mL 3  ? insulin aspart (NOVOLOG) 100 UNIT/ML FlexPen Before each meal 3 times a day, 140-199 - 2 units, 200-250 - 4 units, 251-299 - 6 units,  300-349 - 8 units,  350 or above 10 units. Insulin PEN if approved, provide syringes and needles if needed. 15 mL 0  ? insulin glargine (LANTUS) 100 UNIT/ML injection Inject 0.11 mLs (11 Units total) into the skin at bedtime. 10 mL 3  ? levothyroxine (SYNTHROID) 112 MCG tablet Take 1 tablet (112 mcg total) by mouth daily. 90 tablet 1  ? metoprolol tartrate (LOPRESSOR) 25 MG tablet Take 1 tablet (25 mg total) by mouth 2 (two) times daily. 180 tablet 2  ? Multiple Vitamin (MULTIVITAMIN) tablet Take 1 tablet by mouth daily. For breast and bone health    ? ondansetron (ZOFRAN) 4 MG tablet Take 1 tablet (4 mg total) by mouth every 8 (eight) hours as needed for nausea or vomiting. 20 tablet 0  ? potassium chloride SA (KLOR-CON M)  20 MEQ tablet Take 1 tablet (20 mEq total) by mouth 2 (two) times daily. 60 tablet 2  ? simvastatin (ZOCOR) 20 MG tablet Take 1 tablet (20 mg total) by mouth at bedtime. 90 tablet 1  ? ?No current facility-administered medications on file prior to visit.  ? ? ?Review of Systems  ?Constitutional:  Negative for fever.  ?Gastrointestinal:  Positive for abdominal pain (lower abdomen), diarrhea and nausea. Negative for blood in stool.  ?     No gerd  ?Genitourinary:  Positive for dysuria.  ?     Bladder pressure  ? ?   ?Objective:  ? ?Vitals:  ? 12/09/21 1526  ?BP: 130/82  ?Pulse: 91  ?Temp: 98.1 ?F (36.7 ?C)  ?SpO2: 96%  ? ?BP Readings from Last 3 Encounters:  ?12/09/21 130/82  ?12/01/21 136/84  ?10/14/21 130/72  ? ?Wt Readings from Last 3 Encounters:  ?12/09/21 105 lb (47.6 kg)   ?12/01/21 106 lb 12.8 oz (48.4 kg)  ?09/17/21 117 lb 4.6 oz (53.2 kg)  ? ?Body mass index is 16.45 kg/m?. ? ?  ?Physical Exam ?Constitutional:   ?   General: She is not in acute distress. ?   Comments: Elderly frail female  ?HENT:  ?   Head: Normocephalic.  ?Abdominal:  ?   General: There is no distension.  ?   Palpations: Abdomen is soft. There is no mass.  ?   Tenderness: There is abdominal tenderness (diffuse). There is no guarding or rebound.  ?   Hernia: No hernia is present.  ?Musculoskeletal:  ?   Right lower leg: No edema.  ?   Left lower leg: No edema.  ?Skin: ?   General: Skin is warm and dry.  ?   Findings: No rash.  ?Neurological:  ?   Mental Status: She is alert.  ? ?   ? ? ? ? ? ?Assessment & Plan:  ? ? ?See Problem List for Assessment and Plan of chronic medical problems.  ? ? ? ? ?

## 2021-12-11 ENCOUNTER — Telehealth: Payer: Self-pay | Admitting: Internal Medicine

## 2021-12-11 LAB — CULTURE, URINE COMPREHENSIVE

## 2021-12-11 MED ORDER — FOSFOMYCIN TROMETHAMINE 3 G PO PACK: 3.0000 g | PACK | Freq: Once | ORAL | 0 refills | Status: DC

## 2021-12-11 NOTE — Telephone Encounter (Signed)
Fosfomycin sent - this is a one time dose of an antibiotic that should work well.   ?

## 2021-12-11 NOTE — Telephone Encounter (Signed)
Pt states sulfamethoxazole-trimethoprim (BACTRIM DS) 800-160 MG tablet is making her feel worse ? ? ?Pt requesting an alternative rx sent to [facility], [address] AT [place] ? ?

## 2021-12-11 NOTE — Telephone Encounter (Signed)
Called and left message for patient. Unable to leave message on cell because VM was full.  ? ?Left  message on home number given. ?

## 2021-12-12 MED ORDER — AMOXICILLIN-POT CLAVULANATE 875-125 MG PO TABS: 1.0000 | ORAL_TABLET | Freq: Two times a day (BID) | ORAL | 0 refills | Status: AC

## 2021-12-12 NOTE — Telephone Encounter (Signed)
Augmentin sent in.  

## 2021-12-12 NOTE — Addendum Note (Signed)
Addended by: Binnie Rail on: 12/12/2021 10:23 AM ? ? Modules accepted: Orders ? ?

## 2021-12-12 NOTE — Telephone Encounter (Signed)
Message left for patient today. ? ?Unable to leave message on cell because VM was full. ? ?Message left on home answering machine ?

## 2021-12-12 NOTE — Telephone Encounter (Signed)
Pt requesting amoxicillin.  ? ?Reports being sick to her stomach and was afraid to take the ordered medication from yesterday. ? ?Please call with any questions.  ? ?[facility] [phone] - [place], [address] AT [place] ?

## 2021-12-14 DIAGNOSIS — N3001 Acute cystitis with hematuria: Secondary | ICD-10-CM | POA: Diagnosis not present

## 2021-12-14 DIAGNOSIS — R319 Hematuria, unspecified: Secondary | ICD-10-CM | POA: Diagnosis not present

## 2021-12-14 DIAGNOSIS — R739 Hyperglycemia, unspecified: Secondary | ICD-10-CM | POA: Diagnosis not present

## 2021-12-17 DIAGNOSIS — E039 Hypothyroidism, unspecified: Secondary | ICD-10-CM | POA: Diagnosis not present

## 2021-12-22 DIAGNOSIS — R310 Gross hematuria: Secondary | ICD-10-CM | POA: Diagnosis not present

## 2021-12-22 DIAGNOSIS — N2889 Other specified disorders of kidney and ureter: Secondary | ICD-10-CM | POA: Diagnosis not present

## 2021-12-25 ENCOUNTER — Encounter: Payer: Self-pay | Admitting: Internal Medicine

## 2021-12-25 NOTE — Progress Notes (Deleted)
Subjective:    Patient ID: [name], female    DOB: 01/13/1929, 88 y.o.   MRN: [id number]     HPI [name] is here for follow up of her chronic medical problems, including   Saw Dr [name] - urology at [facility] for her right renal mass.  For cystoscopy, right retrograde pyelogram, right ureteroscopy, tumor biopsy, thulium laser ablation of tumor, right ureteral stent placement  Medications and allergies reviewed with patient and updated if appropriate.  Current Outpatient Medications on File Prior to Visit  Medication Sig Dispense Refill   aspirin EC 81 MG tablet Take 81 mg by mouth daily.     B Complex-C (SUPER B COMPLEX PO) Take 1 tablet by mouth daily.     BD VEO INSULIN SYRINGE U/F 31G X 15/64" 0.3 ML MISC USE WITH INJECTIONS 100 each 2   bisacodyl (DULCOLAX) 10 MG suppository Place 1 suppository (10 mg total) rectally as needed for moderate constipation. 12 suppository 0   carboxymethylcellulose (REFRESH PLUS) 0.5 % SOLN Place 1 drop into both eyes 3 (three) times daily as needed (dry eyes). Non-preservative     chlorhexidine (PERIDEX) 0.12 % solution Use as directed 15 mLs in the mouth or throat 2 (two) times daily as needed. 473 mL 1   cholecalciferol (VITAMIN D) 1000 UNITS tablet Take 5,000 Units by mouth daily.      Continuous Blood Gluc Sensor (FREESTYLE LIBRE 2 SENSOR) MISC See admin instructions.     denosumab (PROLIA) 60 MG/ML SOSY injection See admin instructions.     DUREZOL 0.05 % EMUL Place 1 drop into the right eye 3 (three) times daily.     fish oil-omega-3 fatty acids 1000 MG capsule Take 2 g by mouth daily.     HUMALOG KWIKPEN 100 UNIT/ML KwikPen USE AS DIRECTED MAX 35U/DAY 15 mL 3   insulin aspart (NOVOLOG) 100 UNIT/ML FlexPen Before each meal 3 times a day, 140-199 - 2 units, 200-250 - 4 units, 251-299 - 6 units,  300-349 - 8 units,  350 or above 10 units. Insulin PEN if approved, provide syringes and needles if needed. 15 mL 0   insulin glargine  (LANTUS) 100 UNIT/ML injection Inject 0.11 mLs (11 Units total) into the skin at bedtime. 10 mL 3   levothyroxine (SYNTHROID) 112 MCG tablet Take 1 tablet (112 mcg total) by mouth daily. 90 tablet 1   metoprolol tartrate (LOPRESSOR) 25 MG tablet Take 1 tablet (25 mg total) by mouth 2 (two) times daily. 180 tablet 2   Multiple Vitamin (MULTIVITAMIN) tablet Take 1 tablet by mouth daily. For breast and bone health     ondansetron (ZOFRAN) 4 MG tablet Take 1 tablet (4 mg total) by mouth every 8 (eight) hours as needed for nausea or vomiting. 20 tablet 0   potassium chloride SA (KLOR-CON M) 20 MEQ tablet Take 1 tablet (20 mEq total) by mouth 2 (two) times daily. 60 tablet 2   simvastatin (ZOCOR) 20 MG tablet Take 1 tablet (20 mg total) by mouth at bedtime. 90 tablet 1   No current facility-administered medications on file prior to visit.     Review of Systems     Objective:  There were no vitals filed for this visit. BP Readings from Last 3 Encounters:  12/09/21 130/82  12/01/21 136/84  10/14/21 130/72   Wt Readings from Last 3 Encounters:  12/09/21 105 lb (47.6 kg)  12/01/21 106 lb 12.8 oz (48.4 kg)  09/17/21  117 lb 4.6 oz (53.2 kg)   There is no height or weight on file to calculate BMI.    Physical Exam     Lab Results  Component Value Date   WBC 9.6 09/24/2021   HGB 11.3 (L) 09/24/2021   HCT 32.5 (L) 09/24/2021   PLT 279 09/24/2021   GLUCOSE 155 (H) 09/24/2021   CHOL 169 10/04/2019   TRIG 99.0 10/04/2019   HDL 54.50 10/04/2019   LDLCALC 95 10/04/2019   ALT 14 09/24/2021   AST 20 09/24/2021   NA 128 (L) 09/24/2021   K 3.4 (L) 09/24/2021   CL 99 09/24/2021   CREATININE 0.86 09/24/2021   BUN 10 09/24/2021   CO2 21 (L) 09/24/2021   TSH 379.843 (H) 09/23/2021   INR 1.16 03/18/2012   HGBA1C 6.9 (H) 09/13/2021     Assessment & Plan:    See Problem List for Assessment and Plan of chronic medical problems.

## 2021-12-26 ENCOUNTER — Ambulatory Visit: Payer: Medicare PPO | Admitting: Internal Medicine

## 2021-12-29 ENCOUNTER — Other Ambulatory Visit: Payer: Self-pay | Admitting: Internal Medicine

## 2022-01-01 NOTE — Progress Notes (Unsigned)
Subjective:    Patient ID: [name], female    DOB: May 18, 1929, 88 y.o.   MRN: [id number]     HPI [name] is here for follow up of her chronic medical problems, including   Saw Dr Pilar Jarvis - urology at [facility] for her right renal mass.  For cystoscopy, right retrograde pyelogram, right ureteroscopy, tumor biopsy, thulium laser ablation of tumor, right ureteral stent placement  Urine infection?   She was in [place] 2 weeks ago - was not admitted.  She was diagnosed with a UTI with hematuria.   Took 10 days of Bactrim  - felt beter.  Still had pressure feeling in the bladder and still very weak.  She is still having diarrhea.  That is causing her to feel very weak.  Her urine symptoms have been very continuous since her symptoms started prior to going to [place] having diarrhea - occurs after she eats. Has has some abdominal cramping.    Medications and allergies reviewed with patient and updated if appropriate.  Current Outpatient Medications on File Prior to Visit  Medication Sig Dispense Refill   aspirin EC 81 MG tablet Take 81 mg by mouth daily.     B Complex-C (SUPER B COMPLEX PO) Take 1 tablet by mouth daily.     BD PEN NEEDLE NANO 2ND GEN 32G X 4 MM MISC INJECT AS DIRECTED FOUR TIMES DAILY 200 each 2   BD VEO INSULIN SYRINGE U/F 31G X 15/64" 0.3 ML MISC USE WITH INJECTIONS 100 each 2   bisacodyl (DULCOLAX) 10 MG suppository Place 1 suppository (10 mg total) rectally as needed for moderate constipation. 12 suppository 0   carboxymethylcellulose (REFRESH PLUS) 0.5 % SOLN Place 1 drop into both eyes 3 (three) times daily as needed (dry eyes). Non-preservative     chlorhexidine (PERIDEX) 0.12 % solution Use as directed 15 mLs in the mouth or throat 2 (two) times daily as needed. 473 mL 1   cholecalciferol (VITAMIN D) 1000 UNITS tablet Take 5,000 Units by mouth daily.      Continuous Blood Gluc Sensor (FREESTYLE LIBRE 2 SENSOR) MISC See  admin instructions.     denosumab (PROLIA) 60 MG/ML SOSY injection See admin instructions.     DUREZOL 0.05 % EMUL Place 1 drop into the right eye 3 (three) times daily.     fish oil-omega-3 fatty acids 1000 MG capsule Take 2 g by mouth daily.     HUMALOG KWIKPEN 100 UNIT/ML KwikPen USE AS DIRECTED MAX 35U/DAY 15 mL 3   insulin aspart (NOVOLOG) 100 UNIT/ML FlexPen Before each meal 3 times a day, 140-199 - 2 units, 200-250 - 4 units, 251-299 - 6 units,  300-349 - 8 units,  350 or above 10 units. Insulin PEN if approved, provide syringes and needles if needed. 15 mL 0   insulin glargine (LANTUS) 100 UNIT/ML injection Inject 0.11 mLs (11 Units total) into the skin at bedtime. 10 mL 3   levothyroxine (SYNTHROID) 112 MCG tablet Take 1 tablet (112 mcg total) by mouth daily. 90 tablet 1   metoprolol tartrate (LOPRESSOR) 25 MG tablet Take 1 tablet (25 mg total) by mouth 2 (two) times daily. 180 tablet 2   Multiple Vitamin (MULTIVITAMIN) tablet Take 1 tablet by mouth daily. For breast and bone health     ondansetron (ZOFRAN) 4 MG tablet Take 1 tablet (4 mg total) by mouth every 8 (eight) hours as needed for nausea  or vomiting. 20 tablet 0   potassium chloride SA (KLOR-CON M) 20 MEQ tablet Take 1 tablet (20 mEq total) by mouth 2 (two) times daily. 60 tablet 2   simvastatin (ZOCOR) 20 MG tablet Take 1 tablet (20 mg total) by mouth at bedtime. 90 tablet 1   No current facility-administered medications on file prior to visit.     Review of Systems  Constitutional:  Negative for appetite change and fever.  Gastrointestinal:  Positive for abdominal pain, diarrhea (3-4 times a day - goes after eating) and nausea.  Genitourinary:  Positive for pelvic pain (bladder pressure). Negative for dysuria and hematuria.       Dark urine   Musculoskeletal:  Positive for back pain.      Objective:   Vitals:   01/02/22 1444  BP: 130/60  Pulse: 67  Temp: 97.9 F (36.6 C)  SpO2: 98%   BP Readings from Last 3  Encounters:  01/02/22 130/60  12/09/21 130/82  12/01/21 136/84   Wt Readings from Last 3 Encounters:  01/02/22 104 lb (47.2 kg)  12/09/21 105 lb (47.6 kg)  12/01/21 106 lb 12.8 oz (48.4 kg)   Body mass index is 16.29 kg/m.    Physical Exam Constitutional:      General: She is not in acute distress.    Appearance: Normal appearance. She is not ill-appearing.  HENT:     Head: Normocephalic.  Eyes:     Conjunctiva/sclera: Conjunctivae normal.  Abdominal:     General: There is no distension.     Palpations: Abdomen is soft.     Tenderness: There is abdominal tenderness (mild suprapuic and RLQ). There is no right CVA tenderness, left CVA tenderness, guarding or rebound.  Skin:    General: Skin is warm and dry.  Neurological:     Mental Status: She is alert.       Lab Results  Component Value Date   WBC 9.6 09/24/2021   HGB 11.3 (L) 09/24/2021   HCT 32.5 (L) 09/24/2021   PLT 279 09/24/2021   GLUCOSE 155 (H) 09/24/2021   CHOL 169 10/04/2019   TRIG 99.0 10/04/2019   HDL 54.50 10/04/2019   LDLCALC 95 10/04/2019   ALT 14 09/24/2021   AST 20 09/24/2021   NA 128 (L) 09/24/2021   K 3.4 (L) 09/24/2021   CL 99 09/24/2021   CREATININE 0.86 09/24/2021   BUN 10 09/24/2021   CO2 21 (L) 09/24/2021   TSH 379.843 (H) 09/23/2021   INR 1.16 03/18/2012   HGBA1C 6.9 (H) 09/13/2021     Assessment & Plan:    See Problem List for Assessment and Plan of chronic medical problems.

## 2022-01-02 ENCOUNTER — Encounter: Payer: Self-pay | Admitting: Internal Medicine

## 2022-01-02 ENCOUNTER — Ambulatory Visit: Payer: Medicare PPO | Admitting: Internal Medicine

## 2022-01-02 ENCOUNTER — Other Ambulatory Visit: Payer: Self-pay

## 2022-01-02 VITALS — BP 130/60 | HR 67 | Temp 97.9°F | Ht 67.0 in | Wt 104.0 lb

## 2022-01-02 DIAGNOSIS — E785 Hyperlipidemia, unspecified: Secondary | ICD-10-CM | POA: Diagnosis not present

## 2022-01-02 DIAGNOSIS — N189 Chronic kidney disease, unspecified: Secondary | ICD-10-CM | POA: Diagnosis not present

## 2022-01-02 DIAGNOSIS — I10 Essential (primary) hypertension: Secondary | ICD-10-CM | POA: Diagnosis not present

## 2022-01-02 DIAGNOSIS — Z794 Long term (current) use of insulin: Secondary | ICD-10-CM

## 2022-01-02 DIAGNOSIS — R197 Diarrhea, unspecified: Secondary | ICD-10-CM

## 2022-01-02 DIAGNOSIS — E1165 Type 2 diabetes mellitus with hyperglycemia: Secondary | ICD-10-CM | POA: Diagnosis not present

## 2022-01-02 DIAGNOSIS — N3000 Acute cystitis without hematuria: Secondary | ICD-10-CM

## 2022-01-02 DIAGNOSIS — N390 Urinary tract infection, site not specified: Secondary | ICD-10-CM | POA: Diagnosis not present

## 2022-01-02 DIAGNOSIS — I2510 Atherosclerotic heart disease of native coronary artery without angina pectoris: Secondary | ICD-10-CM

## 2022-01-02 DIAGNOSIS — E871 Hypo-osmolality and hyponatremia: Secondary | ICD-10-CM

## 2022-01-02 DIAGNOSIS — N3001 Acute cystitis with hematuria: Secondary | ICD-10-CM | POA: Diagnosis not present

## 2022-01-02 DIAGNOSIS — E876 Hypokalemia: Secondary | ICD-10-CM

## 2022-01-02 DIAGNOSIS — G9341 Metabolic encephalopathy: Secondary | ICD-10-CM

## 2022-01-02 DIAGNOSIS — E1151 Type 2 diabetes mellitus with diabetic peripheral angiopathy without gangrene: Secondary | ICD-10-CM | POA: Diagnosis not present

## 2022-01-02 DIAGNOSIS — N200 Calculus of kidney: Secondary | ICD-10-CM | POA: Diagnosis not present

## 2022-01-02 DIAGNOSIS — E039 Hypothyroidism, unspecified: Secondary | ICD-10-CM

## 2022-01-02 DIAGNOSIS — G8929 Other chronic pain: Secondary | ICD-10-CM

## 2022-01-02 DIAGNOSIS — M549 Dorsalgia, unspecified: Secondary | ICD-10-CM

## 2022-01-02 DIAGNOSIS — I480 Paroxysmal atrial fibrillation: Secondary | ICD-10-CM | POA: Diagnosis not present

## 2022-01-02 DIAGNOSIS — E870 Hyperosmolality and hypernatremia: Secondary | ICD-10-CM

## 2022-01-02 DIAGNOSIS — Z7982 Long term (current) use of aspirin: Secondary | ICD-10-CM

## 2022-01-02 DIAGNOSIS — E1122 Type 2 diabetes mellitus with diabetic chronic kidney disease: Secondary | ICD-10-CM | POA: Diagnosis not present

## 2022-01-02 DIAGNOSIS — E46 Unspecified protein-calorie malnutrition: Secondary | ICD-10-CM

## 2022-01-02 DIAGNOSIS — D631 Anemia in chronic kidney disease: Secondary | ICD-10-CM | POA: Diagnosis not present

## 2022-01-02 DIAGNOSIS — R300 Dysuria: Secondary | ICD-10-CM | POA: Diagnosis not present

## 2022-01-02 DIAGNOSIS — I129 Hypertensive chronic kidney disease with stage 1 through stage 4 chronic kidney disease, or unspecified chronic kidney disease: Secondary | ICD-10-CM | POA: Diagnosis not present

## 2022-01-02 LAB — POC URINALSYSI DIPSTICK (AUTOMATED)
Bilirubin, UA: NEGATIVE
Glucose, UA: NEGATIVE
Ketones, UA: POSITIVE
Nitrite, UA: NEGATIVE
Protein, UA: POSITIVE — AB
Spec Grav, UA: 1.02 (ref 1.010–1.025)
Urobilinogen, UA: 0.2 E.U./dL
pH, UA: 6 (ref 5.0–8.0)

## 2022-01-02 MED ORDER — SULFAMETHOXAZOLE-TRIMETHOPRIM 400-80 MG PO TABS: 1.0000 | ORAL_TABLET | Freq: Two times a day (BID) | ORAL | 0 refills | Status: DC

## 2022-01-02 NOTE — Patient Instructions (Addendum)
      Medications changes include :   stop the metamucil for now.  Start Bactrim twice daily for 14 days   Your prescription(s) have been sent to your pharmacy.    A referral was ordered for infectious disease.     Someone from that office will call you to schedule an appointment.    Return if symptoms worsen or fail to improve.

## 2022-01-03 DIAGNOSIS — N390 Urinary tract infection, site not specified: Secondary | ICD-10-CM | POA: Insufficient documentation

## 2022-01-03 DIAGNOSIS — R300 Dysuria: Secondary | ICD-10-CM | POA: Insufficient documentation

## 2022-01-03 NOTE — Assessment & Plan Note (Signed)
Subacute Still having diarrhea Was supposed to give stool sample to Endo but never did Stool sample today to r/o cdiff

## 2022-01-03 NOTE — Assessment & Plan Note (Signed)
Recurrent/frequent UTIs Will refer to ID Is seeing urology but for renal mass

## 2022-01-03 NOTE — Assessment & Plan Note (Signed)
Acute - possible incompletely treated UTI Urine dip consistent with UTI Will send urine for culture Take the antibiotic as prescribed.  Bactrim 400-80 bid x 14 days Take tylenol if needed.   Increase your water intake.  Call if no improvement

## 2022-01-03 NOTE — Assessment & Plan Note (Signed)
Chronic BP well controlled Continue metoprolol 25 mg bid

## 2022-01-06 ENCOUNTER — Telehealth: Payer: Self-pay | Admitting: Internal Medicine

## 2022-01-06 LAB — CULTURE, URINE COMPREHENSIVE

## 2022-01-06 NOTE — Telephone Encounter (Signed)
Please call her and let her know her urine culture did confirm an infection.  There is only a small amount of bacteria present.  I am not sure if her antibiotic she is taking is going to treat the infection or not.  I would recommend she take the antibiotic for 1 week only and then stop it.  Ideally she should come in and give a urine sample to recheck to make sure the infection is completely cleared.

## 2022-01-06 NOTE — Telephone Encounter (Signed)
Voicemail full so unable to leave a message.  Called home number (Secondary number listed) and left message for patient to return call to clinic and confirm message left)

## 2022-01-07 DIAGNOSIS — R197 Diarrhea, unspecified: Secondary | ICD-10-CM | POA: Diagnosis not present

## 2022-01-08 ENCOUNTER — Encounter: Payer: Self-pay | Admitting: Internal Medicine

## 2022-01-08 ENCOUNTER — Other Ambulatory Visit: Payer: Self-pay

## 2022-01-08 ENCOUNTER — Ambulatory Visit: Payer: Medicare PPO | Admitting: Internal Medicine

## 2022-01-08 DIAGNOSIS — Z87440 Personal history of urinary (tract) infections: Secondary | ICD-10-CM | POA: Insufficient documentation

## 2022-01-08 NOTE — Progress Notes (Signed)
[name] for Infectious Disease      Reason for Consult: 'Recurrent UTI's'   Referring Physician: Dr. Quay Burow    Patient ID: [name], female    DOB: 08-09-1929, 88 y.o.   MRN: [id number]  HPI:   She is here for evaluation of positive urine cultures. She is followed by urology at [facility] for a renal mass and was recently in the ED in [place] reportedly for a urinary tract infection and given Bactrim for 10 days.   No records are available for this. 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 Pilar Jarvis of urology with plans for cystoscopy biopsy and thulium laser ablation of the tumor.  She saw Dr. Quay Burow on 5/26 with symtpoms of pressure and a urine culture was sent with a minimal growth of E faecalis and a benign UA.  It was notable for persistent protein.  She has no concerns.  She has been on about 4 or 5 courses of antibiotics recently.  She has had persistent diarrhea for about 4 months, started when she started antibiotics.  She has lost a significant amount of weight.    Past Medical History:  Diagnosis Date   Anxiety    Arthritis    fingers   Bowel obstruction ([place])    Cancer of upper-outer quadrant of female breast ([place]) 08/21/2011   ER +  PR +  Her 2 -  Ki67 9%   0.9 cm invasive lobular  Carcinoma ,s/p central lumpectomy with sentinel node biopsy,  ER/PR positive s/p re-excion on 09/16/11 with final pathology showing atypical hyperplasia    CAP (community acquired pneumonia) 12/06/2016   Diabetes mellitus type 1 ([place])    type I- wears insulin pump   GERD (gastroesophageal reflux disease)    History of small bowel obstruction    Hypercholesteremia    Hypergastrinemia    Hypertension    Hypertension    Hyponatremia    Hypothyroid    Hypothyroidism    IBS (irritable bowel syndrome)    Osteoarthritis    Osteoporosis, post-menopausal    DEXA 12/02/11: -3.5 (with lomax gyn), declines bisphos due to bone pain   PAF  (paroxysmal atrial fibrillation) ([place])    One episode in 2008, spontaneously converted in the ED, no further treatment   Spondylosis    thoracic spine   Thyroid nodule dx 07/2011   [place] q 22moto follow calcified L thyroid nodule (47/37/10[place]   Umbilical hernia     Prior to Admission medications   Medication Sig Start Date End Date Taking? Authorizing Provider  aspirin EC 81 MG tablet Take 81 mg by mouth daily.    [provider]  B Complex-C (SUPER B COMPLEX PO) Take 1 tablet by mouth daily.    [provider]  BD PEN NEEDLE NANO 2ND GEN 32G X 4 MM MISC INJECT AS DIRECTED FOUR TIMES DAILY 12/30/21   BBinnie Rail MD  BD VEO INSULIN SYRINGE U/F 31G X 15/64" 0.3 ML MISC USE WITH INJECTIONS 07/28/21   BBinnie Rail MD  bisacodyl (DULCOLAX) 10 MG suppository Place 1 suppository (10 mg total) rectally as needed for moderate constipation. 04/27/19   BBinnie Rail MD  carboxymethylcellulose (REFRESH PLUS) 0.5 % SOLN Place 1 drop into both eyes 3 (three) times daily as needed (dry eyes). Non-preservative    [provider]  chlorhexidine (PERIDEX) 0.12 % solution Use as directed 15 mLs in  the mouth or throat 2 (two) times daily as needed. 10/14/21   Binnie Rail, MD  cholecalciferol (VITAMIN D) 1000 UNITS tablet Take 5,000 Units by mouth daily.     [provider]  Continuous Blood Gluc Sensor (FREESTYLE LIBRE 2 SENSOR) MISC See admin instructions.    [provider]  denosumab (PROLIA) 60 MG/ML SOSY injection See admin instructions. 11/19/21   [provider]  DUREZOL 0.05 % EMUL Place 1 drop into the right eye 3 (three) times daily. 04/01/21   [provider]  fish oil-omega-3 fatty acids 1000 MG capsule Take 2 g by mouth daily.    [provider]  HUMALOG KWIKPEN 100 UNIT/ML KwikPen USE AS DIRECTED MAX 35U/DAY 11/28/21   Burns, Claudina Lick, MD  insulin aspart (NOVOLOG) 100 UNIT/ML FlexPen Before each meal 3 times a day, 140-199 - 2  units, 200-250 - 4 units, 251-299 - 6 units,  300-349 - 8 units,  350 or above 10 units. Insulin PEN if approved, provide syringes and needles if needed. 09/24/21   Thurnell Lose, MD  insulin glargine (LANTUS) 100 UNIT/ML injection Inject 0.11 mLs (11 Units total) into the skin at bedtime. 10/14/21   Binnie Rail, MD  levothyroxine (SYNTHROID) 112 MCG tablet Take 1 tablet (112 mcg total) by mouth daily. 10/14/21   Binnie Rail, MD  metoprolol tartrate (LOPRESSOR) 25 MG tablet Take 1 tablet (25 mg total) by mouth 2 (two) times daily. 10/24/21   Binnie Rail, MD  Multiple Vitamin (MULTIVITAMIN) tablet Take 1 tablet by mouth daily. For breast and bone health    [provider]  ondansetron (ZOFRAN) 4 MG tablet Take 1 tablet (4 mg total) by mouth every 8 (eight) hours as needed for nausea or vomiting. 06/30/21   Binnie Rail, MD  potassium chloride SA (KLOR-CON M) 20 MEQ tablet Take 1 tablet (20 mEq total) by mouth 2 (two) times daily. 10/24/21   Binnie Rail, MD  simvastatin (ZOCOR) 20 MG tablet Take 1 tablet (20 mg total) by mouth at bedtime. 10/24/21   Binnie Rail, MD  sulfamethoxazole-trimethoprim (BACTRIM) 400-80 MG tablet Take 1 tablet by mouth 2 (two) times daily for 14 days. 01/02/22 01/16/22  Binnie Rail, MD    Allergies  Allergen Reactions   Hydrochlorothiazide Other (See Comments)    hyponatremia   Eliquis [Apixaban] Other (See Comments)    Dizziness, severe headach   Amlodipine     Dizzy, nausea   Augmentin [Amoxicillin-Pot Clavulanate] Other (See Comments)    Gi upset only no rash or hives    Chlorhexidine    Cortisone Other (See Comments)    Makes blood sugars run high   Keflex [Cephalexin] Hives and Nausea Only    Stomach upset   Omeprazole Nausea Only    Dizziness and nausea   Prednisone     Other reaction(s): Other Makes blood sugars run high   Hydrocodone Rash    Rash to chest , side back   Latex Rash    Powder in gloves causes rash; "not allergic to  latex just the powder"   Nitrofuran Derivatives Diarrhea and Nausea Only    Upset stomach, diarrhea    Social History   Tobacco Use   Smoking status: Former    Packs/day: 1.00    Years: 10.00    Pack years: 10.00    Types: Cigarettes    Quit date: 08/27/1977    Years since quitting: 57.3  Smokeless tobacco: Never  Vaping Use   Vaping Use: Never used  Substance Use Topics   Alcohol use: Yes    Comment: rare   Drug use: No    Family History  Problem Relation Age of Onset   Cancer Sister        unknown   Hypertension Mother    Stroke Mother    Arthritis Father    Heart disease Father    Diabetes Son    Colon cancer Neg Hx    Esophageal cancer Neg Hx      Review of Systems  Constitutional: negative for fevers and chills Genitourinary: negative for frequency and dysuria All other systems reviewed and are negative    Constitutional: in no apparent distress There were no vitals filed for this visit. EYES: anicteric Neuro: alert  Labs: Lab Results  Component Value Date   WBC 9.6 09/24/2021   HGB 11.3 (L) 09/24/2021   HCT 32.5 (L) 09/24/2021   MCV 88.3 09/24/2021   PLT 279 09/24/2021    Lab Results  Component Value Date   CREATININE 0.86 09/24/2021   BUN 10 09/24/2021   NA 128 (L) 09/24/2021   K 3.4 (L) 09/24/2021   CL 99 09/24/2021   CO2 21 (L) 09/24/2021    Lab Results  Component Value Date   ALT 14 09/24/2021   AST 20 09/24/2021   ALKPHOS 70 09/24/2021   BILITOT 0.7 09/24/2021   INR 1.16 03/18/2012     Assessment: asymptomatic bacteruria.  She is having no symptoms and her UA is negative for infection.  Urine culture noted and consistent with AB.  No treatment indicated.  No indication to repeat urine testing after treatment for UTI. I suspect the multiple courses of antibiotics are contributing to her diarrhea and weight loss.  I have advised she stop the antibiotics now. C diff testing has been sent.  If negative, should start imodium or  something similar.    Plan: 1)  no repeat urine testing without significant symptomatology. 2) stop antibiotics.  Follow up as needed.

## 2022-01-10 LAB — CLOSTRIDIUM DIFFICILE BY PCR: Toxigenic C. Difficile by PCR: NEGATIVE

## 2022-01-12 ENCOUNTER — Other Ambulatory Visit: Payer: Self-pay

## 2022-01-12 ENCOUNTER — Telehealth: Payer: Self-pay | Admitting: Internal Medicine

## 2022-01-12 MED ORDER — "BD VEO INSULIN SYRINGE U/F 31G X 15/64"" 0.3 ML MISC": 2 refills | Status: AC

## 2022-01-12 NOTE — Telephone Encounter (Signed)
Patient needs pharmacy changed - [facility] on [address] in [place], [place]  She needs the needles BD VEO insulin syringes - BD ultra fine syringes 31 G.  Patient only has 1 left so she would like a box sent in.

## 2022-01-12 NOTE — Telephone Encounter (Signed)
Sent in today 

## 2022-01-19 ENCOUNTER — Encounter: Payer: Self-pay | Admitting: Internal Medicine

## 2022-01-19 ENCOUNTER — Ambulatory Visit: Payer: Medicare PPO | Admitting: Internal Medicine

## 2022-01-19 VITALS — BP 140/72 | HR 65 | Ht 64.0 in | Wt 101.4 lb

## 2022-01-19 DIAGNOSIS — I480 Paroxysmal atrial fibrillation: Secondary | ICD-10-CM | POA: Diagnosis not present

## 2022-01-19 DIAGNOSIS — I10 Essential (primary) hypertension: Secondary | ICD-10-CM

## 2022-01-19 NOTE — Progress Notes (Signed)
HPI [name] returns today for followup. She is a pleasant 88 yo woman with a h/o HTN, PAF, and hypothyoidism. She has been diagnosed with hematuria and is pending surgery. She has minimal palpitations. She has not had more bleeding. She notes some non-specific back pain. Her big complaint today is that her weight continues down. She notes that she is not eating as much. Her appetite is good.  Allergies  Allergen Reactions   Hydrochlorothiazide Other (See Comments)    hyponatremia   Eliquis [Apixaban] Other (See Comments)    Dizziness, severe headach   Amlodipine     Dizzy, nausea   Augmentin [Amoxicillin-Pot Clavulanate] Other (See Comments)    Gi upset only no rash or hives    Chlorhexidine    Cortisone Other (See Comments)    Makes blood sugars run high   Keflex [Cephalexin] Hives and Nausea Only    Stomach upset   Omeprazole Nausea Only    Dizziness and nausea   Prednisone     Other reaction(s): Other Makes blood sugars run high   Hydrocodone Rash    Rash to chest , side back   Latex Rash    Powder in gloves causes rash; "not allergic to latex just the powder"   Nitrofuran Derivatives Diarrhea and Nausea Only    Upset stomach, diarrhea     Current Outpatient Medications  Medication Sig Dispense Refill   aspirin EC 81 MG tablet Take 81 mg by mouth daily.     B Complex-C (SUPER B COMPLEX PO) Take 1 tablet by mouth daily.     BD PEN NEEDLE NANO 2ND GEN 32G X 4 MM MISC INJECT AS DIRECTED FOUR TIMES DAILY 200 each 2   bisacodyl (DULCOLAX) 10 MG suppository Place 1 suppository (10 mg total) rectally as needed for moderate constipation. 12 suppository 0   carboxymethylcellulose (REFRESH PLUS) 0.5 % SOLN Place 1 drop into both eyes 3 (three) times daily as needed (dry eyes). Non-preservative     chlorhexidine (PERIDEX) 0.12 % solution Use as directed 15 mLs in the mouth or throat 2 (two) times daily as needed. 473 mL 1   cholecalciferol (VITAMIN D) 1000 UNITS tablet  Take 5,000 Units by mouth daily.      Continuous Blood Gluc Sensor (FREESTYLE LIBRE 2 SENSOR) MISC See admin instructions.     denosumab (PROLIA) 60 MG/ML SOSY injection See admin instructions.     DUREZOL 0.05 % EMUL Place 1 drop into the right eye 3 (three) times daily.     fish oil-omega-3 fatty acids 1000 MG capsule Take 2 g by mouth daily.     HUMALOG KWIKPEN 100 UNIT/ML KwikPen USE AS DIRECTED MAX 35U/DAY 15 mL 3   insulin aspart (NOVOLOG) 100 UNIT/ML FlexPen Before each meal 3 times a day, 140-199 - 2 units, 200-250 - 4 units, 251-299 - 6 units,  300-349 - 8 units,  350 or above 10 units. Insulin PEN if approved, provide syringes and needles if needed. 15 mL 0   insulin glargine (LANTUS) 100 UNIT/ML injection Inject 0.11 mLs (11 Units total) into the skin at bedtime. 10 mL 3   Insulin Syringe-Needle U-100 (BD VEO INSULIN SYRINGE U/F) 31G X 15/64" 0.3 ML MISC USE WITH INJECTIONS 100 each 2   levothyroxine (SYNTHROID) 112 MCG tablet Take 1 tablet (112 mcg total) by mouth daily. 90 tablet 1   metoprolol tartrate (LOPRESSOR) 25 MG tablet Take 1 tablet (25 mg total) by mouth 2 (  two) times daily. 180 tablet 2   Multiple Vitamin (MULTIVITAMIN) tablet Take 1 tablet by mouth daily. For breast and bone health     ondansetron (ZOFRAN) 4 MG tablet Take 1 tablet (4 mg total) by mouth every 8 (eight) hours as needed for nausea or vomiting. 20 tablet 0   potassium chloride SA (KLOR-CON M) 20 MEQ tablet Take 1 tablet (20 mEq total) by mouth 2 (two) times daily. 60 tablet 2   simvastatin (ZOCOR) 20 MG tablet Take 1 tablet (20 mg total) by mouth at bedtime. 90 tablet 1   No current facility-administered medications for this visit.     Past Medical History:  Diagnosis Date   Anxiety    Arthritis    fingers   Bowel obstruction ([place])    Cancer of upper-outer quadrant of female breast ([place]) 08/21/2011   ER +  PR +  Her 2 -  Ki67 9%   0.9 cm invasive lobular  Carcinoma ,s/p central lumpectomy with  sentinel node biopsy,  ER/PR positive s/p re-excion on 09/16/11 with final pathology showing atypical hyperplasia    CAP (community acquired pneumonia) 12/06/2016   Diabetes mellitus type 1 ([place])    type I- wears insulin pump   GERD (gastroesophageal reflux disease)    History of small bowel obstruction    Hypercholesteremia    Hypergastrinemia    Hypertension    Hypertension    Hyponatremia    Hypothyroid    Hypothyroidism    IBS (irritable bowel syndrome)    Osteoarthritis    Osteoporosis, post-menopausal    DEXA 12/02/11: -3.5 (with lomax gyn), declines bisphos due to bone pain   PAF (paroxysmal atrial fibrillation) ([place])    One episode in 2008, spontaneously converted in the ED, no further treatment   Spondylosis    thoracic spine   Thyroid nodule dx 07/2011   [place] q 68mo to follow calcified L thyroid nodule (1/61/09 [place])   Umbilical hernia     ROS:   All systems reviewed and negative except as noted in the HPI.   Past Surgical History:  Procedure Laterality Date   ABDOMINAL HYSTERECTOMY     ABDOMINAL HYSTERECTOMY  yrs ago   APPENDECTOMY     APPENDECTOMY  age 78   BLADDER REPAIR  5 yrs ago   BREAST LUMPECTOMY     BREAST SURGERY     biopsy   BREAST SURGERY   yrs ago   br bx   BREAST SURGERY  01/ 23/2013   left central lumpectomy and snbx, re-excision lumpectomy   CARDIOVASCULAR STRESS TEST  03/17/2012   colon surgery     Small intestines- took 6 inches out   COLONOSCOPY  07/11/2015   [facility] Dr Derrill Kay   ESOPHAGOGASTRODUODENOSCOPY  07/17/2010   [facility]   LEFT HEART CATHETERIZATION WITH CORONARY ANGIOGRAM N/A 03/18/2012   Procedure: LEFT HEART CATHETERIZATION WITH CORONARY ANGIOGRAM;  Surgeon: Minus Breeding, MD;  Location: [facility] CATH LAB;  Service: Cardiovascular;  Laterality: N/A;   TONSILLECTOMY   age 20     Family History  Problem Relation Age of Onset   Cancer Sister        unknown   Hypertension Mother    Stroke Mother    Arthritis Father    Heart  disease Father    Diabetes Son    Colon cancer Neg Hx    Esophageal cancer Neg Hx      Social History   Socioeconomic History   Marital  status: Married    Spouse name: Not on file   Number of children: Not on file   Years of education: Not on file   Highest education level: Not on file  Occupational History   Not on file  Tobacco Use   Smoking status: Former    Packs/day: 1.00    Years: 10.00    Total pack years: 10.00    Types: Cigarettes    Quit date: 08/27/1977    Years since quitting: 44.4   Smokeless tobacco: Never  Vaping Use   Vaping Use: Never used  Substance and Sexual Activity   Alcohol use: Yes    Comment: rare   Drug use: No   Sexual activity: Yes    Birth control/protection: Post-menopausal  Other Topics Concern   Not on file  Social History Narrative   ** Merged History Encounter **       Social Determinants of Health   Financial Resource Strain: Not on file  Food Insecurity: Not on file  Transportation Needs: Not on file  Physical Activity: Not on file  Stress: Not on file  Social Connections: Not on file  Intimate Partner Violence: Not on file     BP 140/72   Pulse 65   Ht $R'5\' 4"'uw$  (1.626 m)   Wt 101 lb 6.4 oz (46 kg)   SpO2 94%   BMI 17.41 kg/m   Physical Exam:  Stable appearing elderly woman, NAD HEENT: Unremarkable Neck:  No JVD, no thyromegally Lymphatics:  No adenopathy Back:  No CVA tenderness Lungs:  Clear with no wheezes HEART:  Regular rate rhythm, no murmurs, no rubs, no clicks Abd:  soft, positive bowel sounds, no organomegally, no rebound, no guarding Ext:  2 plus pulses, no edema, no cyanosis, no clubbing Skin:  No rashes no nodules Neuro:  CN II through XII intact, motor grossly intact   Assess/Plan:  PAF - she feels occaisional palpitations. She is minimally symptomatic but is not inclined to consider the use of systemic anti-coagulation. She has hematuria.  Hematuria - she is pending renal mass treatment, she  says with RF.  HTN - her bp is minimally elevated. Preoperative eval - she is low risk for kidney surgery and may undergo either IV or deep sedation or general anesthesia.  [name] Cheryl Essex,MD

## 2022-01-19 NOTE — Patient Instructions (Addendum)
Medication Instructions:  Your physician recommends that you continue on your current medications as directed. Please refer to the Current Medication list given to you today.  Labwork: None ordered.  Testing/Procedures: None ordered.  Follow-Up: Your physician wants you to follow-up in: 6 months with Cristopher Peru, MD or one of the following Advanced Practice Providers on your designated Care Team:   Tommye Standard, [place] Legrand Como "Jonni Sanger" Chalmers Cater, [place]    Any Other Special Instructions Will Be Listed Below (If Applicable).  If you need a refill on your cardiac medications before your next appointment, please call your pharmacy.   Important Information About Sugar

## 2022-01-27 NOTE — Progress Notes (Signed)
Subjective:    Patient ID: [name], female    DOB: 1929-07-06, 88 y.o.   MRN: [id number]      HPI [name] is here for  Chief Complaint  Patient presents with   pain and burning during urination    ? UTI - her symptoms started this morning.  She has urethra pain and burning.  The urine looked cloudy.    Right sided renal mass, probable RCC - has f/u with urology.   Diarrhea - has loose stools in the morning and at night.  There is a little bit of formed stool.  No abdominal cramping.  Just having small amounts of stool.  She can feel gurgling or intestinal movement.  She is not taking any imodium or metamucil now.  She does not always feel like she is having complete BMs.  She denies normal stools inbetween the diarrhea.   History of chronic constipation. History of diarrhea 2017-2018-had small amounts of stool with bowel movements but only with great pushing and pressure and some cramping.  She had an anorectal manometry that suggested pelvic dyssynergia and did biofeedback.  She was taking Imodium every other day at that time.  She is eating regularly but not much.  She wants to know how to gain weight.    Medications and allergies reviewed with patient and updated if appropriate.  Current Outpatient Medications on File Prior to Visit  Medication Sig Dispense Refill   aspirin EC 81 MG tablet Take 81 mg by mouth daily.     B Complex-C (SUPER B COMPLEX PO) Take 1 tablet by mouth daily.     BD PEN NEEDLE NANO 2ND GEN 32G X 4 MM MISC INJECT AS DIRECTED FOUR TIMES DAILY 200 each 2   bisacodyl (DULCOLAX) 10 MG suppository Place 1 suppository (10 mg total) rectally as needed for moderate constipation. 12 suppository 0   carboxymethylcellulose (REFRESH PLUS) 0.5 % SOLN Place 1 drop into both eyes 3 (three) times daily as needed (dry eyes). Non-preservative     chlorhexidine (PERIDEX) 0.12 % solution Use as directed 15 mLs in the mouth or throat 2 (two) times daily as needed.  473 mL 1   cholecalciferol (VITAMIN D) 1000 UNITS tablet Take 5,000 Units by mouth daily.      Continuous Blood Gluc Sensor (FREESTYLE LIBRE 2 SENSOR) MISC See admin instructions.     denosumab (PROLIA) 60 MG/ML SOSY injection See admin instructions.     DUREZOL 0.05 % EMUL Place 1 drop into the right eye 3 (three) times daily.     fish oil-omega-3 fatty acids 1000 MG capsule Take 2 g by mouth daily.     HUMALOG KWIKPEN 100 UNIT/ML KwikPen USE AS DIRECTED MAX 35U/DAY 15 mL 3   insulin aspart (NOVOLOG) 100 UNIT/ML FlexPen Before each meal 3 times a day, 140-199 - 2 units, 200-250 - 4 units, 251-299 - 6 units,  300-349 - 8 units,  350 or above 10 units. Insulin PEN if approved, provide syringes and needles if needed. 15 mL 0   insulin glargine (LANTUS) 100 UNIT/ML injection Inject 0.11 mLs (11 Units total) into the skin at bedtime. 10 mL 3   Insulin Syringe-Needle U-100 (BD VEO INSULIN SYRINGE U/F) 31G X 15/64" 0.3 ML MISC USE WITH INJECTIONS 100 each 2   levothyroxine (SYNTHROID) 112 MCG tablet Take 1 tablet (112 mcg total) by mouth daily. 90 tablet 1   metoprolol tartrate (LOPRESSOR) 25 MG tablet Take 1 tablet (25  mg total) by mouth 2 (two) times daily. 180 tablet 2   Multiple Vitamin (MULTIVITAMIN) tablet Take 1 tablet by mouth daily. For breast and bone health     ondansetron (ZOFRAN) 4 MG tablet Take 1 tablet (4 mg total) by mouth every 8 (eight) hours as needed for nausea or vomiting. 20 tablet 0   potassium chloride SA (KLOR-CON M) 20 MEQ tablet Take 1 tablet (20 mEq total) by mouth 2 (two) times daily. 60 tablet 2   simvastatin (ZOCOR) 20 MG tablet Take 1 tablet (20 mg total) by mouth at bedtime. 90 tablet 1   No current facility-administered medications on file prior to visit.    Review of Systems  Constitutional:  Negative for fever.  Gastrointestinal:  Positive for nausea (every morning the past few days). Negative for abdominal pain.  Genitourinary:  Positive for difficulty urinating  (sometimes), dysuria, frequency and hematuria (maybe).       Objective:   Vitals:   01/28/22 1354  BP: 120/68  Pulse: 61  Temp: 97.9 F (36.6 C)  SpO2: 96%   BP Readings from Last 3 Encounters:  01/28/22 120/68  01/19/22 140/72  01/08/22 138/82   Wt Readings from Last 3 Encounters:  01/28/22 101 lb (45.8 kg)  01/19/22 101 lb 6.4 oz (46 kg)  01/08/22 105 lb (47.6 kg)   Body mass index is 17.34 kg/m.    Physical Exam Constitutional:      General: She is not in acute distress.    Appearance: Normal appearance. She is not ill-appearing.  HENT:     Head: Normocephalic.  Eyes:     Conjunctiva/sclera: Conjunctivae normal.  Abdominal:     General: There is no distension.     Palpations: Abdomen is soft.     Tenderness: There is abdominal tenderness (mild suprapubic tenderness). There is no right CVA tenderness, left CVA tenderness, guarding or rebound.  Musculoskeletal:     Right lower leg: No edema.     Left lower leg: No edema.  Skin:    General: Skin is warm and dry.  Neurological:     Mental Status: She is alert.            Assessment & Plan:    See Problem List for Assessment and Plan of chronic medical problems.

## 2022-01-28 ENCOUNTER — Encounter: Payer: Self-pay | Admitting: Internal Medicine

## 2022-01-28 ENCOUNTER — Ambulatory Visit: Payer: Medicare PPO | Admitting: Internal Medicine

## 2022-01-28 VITALS — BP 120/68 | HR 61 | Temp 97.9°F | Wt 101.0 lb

## 2022-01-28 DIAGNOSIS — N3000 Acute cystitis without hematuria: Secondary | ICD-10-CM

## 2022-01-28 DIAGNOSIS — I10 Essential (primary) hypertension: Secondary | ICD-10-CM | POA: Diagnosis not present

## 2022-01-28 DIAGNOSIS — R300 Dysuria: Secondary | ICD-10-CM | POA: Diagnosis not present

## 2022-01-28 DIAGNOSIS — E039 Hypothyroidism, unspecified: Secondary | ICD-10-CM

## 2022-01-28 DIAGNOSIS — R197 Diarrhea, unspecified: Secondary | ICD-10-CM | POA: Diagnosis not present

## 2022-01-28 DIAGNOSIS — Z1389 Encounter for screening for other disorder: Secondary | ICD-10-CM

## 2022-01-28 LAB — CBC WITH DIFFERENTIAL/PLATELET
Basophils Absolute: 0 10*3/uL (ref 0.0–0.1)
Basophils Relative: 0.4 % (ref 0.0–3.0)
Eosinophils Absolute: 0 10*3/uL (ref 0.0–0.7)
Eosinophils Relative: 0.5 % (ref 0.0–5.0)
HCT: 32.1 % — ABNORMAL LOW (ref 36.0–46.0)
Hemoglobin: 10.7 g/dL — ABNORMAL LOW (ref 12.0–15.0)
Lymphocytes Relative: 30.4 % (ref 12.0–46.0)
Lymphs Abs: 2.1 10*3/uL (ref 0.7–4.0)
MCHC: 33.4 g/dL (ref 30.0–36.0)
MCV: 87.2 fl (ref 78.0–100.0)
Monocytes Absolute: 0.7 10*3/uL (ref 0.1–1.0)
Monocytes Relative: 10.8 % (ref 3.0–12.0)
Neutro Abs: 4 10*3/uL (ref 1.4–7.7)
Neutrophils Relative %: 57.9 % (ref 43.0–77.0)
Platelets: 273 10*3/uL (ref 150.0–400.0)
RBC: 3.68 Mil/uL — ABNORMAL LOW (ref 3.87–5.11)
RDW: 12.4 % (ref 11.5–15.5)
WBC: 6.9 10*3/uL (ref 4.0–10.5)

## 2022-01-28 LAB — POCT URINALYSIS DIPSTICK
Bilirubin, UA: NEGATIVE
Blood, UA: POSITIVE
Glucose, UA: NEGATIVE
Ketones, UA: NEGATIVE
Leukocytes, UA: NEGATIVE
Nitrite, UA: NEGATIVE
Protein, UA: POSITIVE — AB
Spec Grav, UA: 1.02 (ref 1.010–1.025)
Urobilinogen, UA: NEGATIVE E.U./dL — AB
pH, UA: 6 (ref 5.0–8.0)

## 2022-01-28 LAB — COMPREHENSIVE METABOLIC PANEL
ALT: 8 U/L (ref 0–35)
AST: 16 U/L (ref 0–37)
Albumin: 3.8 g/dL (ref 3.5–5.2)
Alkaline Phosphatase: 80 U/L (ref 39–117)
BUN: 19 mg/dL (ref 6–23)
CO2: 25 mEq/L (ref 19–32)
Calcium: 10.1 mg/dL (ref 8.4–10.5)
Chloride: 97 mEq/L (ref 96–112)
Creatinine, Ser: 0.73 mg/dL (ref 0.40–1.20)
GFR: 72.29 mL/min (ref >60.00)
Glucose, Bld: 108 mg/dL — ABNORMAL HIGH (ref 70–99)
Potassium: 3.9 mEq/L (ref 3.5–5.1)
Sodium: 128 mEq/L — ABNORMAL LOW (ref 135–145)
Total Bilirubin: 0.4 mg/dL (ref 0.2–1.2)
Total Protein: 7.5 g/dL (ref 6.0–8.3)

## 2022-01-28 LAB — TSH: TSH: 8.69 u[IU]/mL — ABNORMAL HIGH (ref 0.35–5.50)

## 2022-01-28 MED ORDER — FAMOTIDINE 40 MG PO TABS: 40.0000 mg | ORAL_TABLET | Freq: Every day | ORAL | 5 refills | Status: AC

## 2022-01-28 MED ORDER — ONDANSETRON HCL 4 MG PO TABS: 4.0000 mg | ORAL_TABLET | Freq: Three times a day (TID) | ORAL | 0 refills | Status: AC | PRN

## 2022-01-28 NOTE — Patient Instructions (Addendum)
     Have blood work done today.     Medications changes include :   pepcid 40 mg at bedtime. Restart metamucil daily.  Try taking AZO ( pyridium) for your symptoms.    Your prescription(s) have been sent to your pharmacy.    We will wait for the urine culture results before starting an antibiotic.     Return if symptoms worsen or fail to improve.

## 2022-01-29 LAB — URINE CULTURE

## 2022-01-29 NOTE — Assessment & Plan Note (Addendum)
Chronic Has been going on for a while cdiff neg Having small amounts of diarrhea twice a day - morning and night - no abdominal pain Advised to start metamucil and titrate as needed - this has helped in the past Cbc, cmp

## 2022-01-29 NOTE — Assessment & Plan Note (Signed)
Acute Started this morning Urine dip with blood and protein - no leuks, nitrites ? UTI or other Send urine for culture Will hold off on abx until culture comes back - will only treat if positive Advised otc AZO for dysuria for now If symptoms worsen in the next 1-2 days she will call

## 2022-01-29 NOTE — Assessment & Plan Note (Signed)
Chronic  Clinically euthyroid Currently taking levothyroxine 112 mcg daily Check tsh  Titrate med dose if needed

## 2022-01-30 MED ORDER — LEVOTHYROXINE SODIUM 125 MCG PO TABS: 125.0000 ug | ORAL_TABLET | Freq: Every day | ORAL | 3 refills | Status: AC

## 2022-02-02 DIAGNOSIS — E1065 Type 1 diabetes mellitus with hyperglycemia: Secondary | ICD-10-CM | POA: Diagnosis not present

## 2022-02-04 ENCOUNTER — Other Ambulatory Visit: Payer: Self-pay | Admitting: Internal Medicine

## 2022-02-11 DIAGNOSIS — R310 Gross hematuria: Secondary | ICD-10-CM | POA: Diagnosis not present

## 2022-02-11 DIAGNOSIS — N2889 Other specified disorders of kidney and ureter: Secondary | ICD-10-CM | POA: Diagnosis not present

## 2022-02-17 ENCOUNTER — Ambulatory Visit: Payer: Medicare PPO | Admitting: Family Medicine

## 2022-02-17 ENCOUNTER — Ambulatory Visit: Payer: Medicare PPO | Admitting: Internal Medicine

## 2022-02-19 DIAGNOSIS — S3719XS Other injury of ureter, sequela: Secondary | ICD-10-CM | POA: Diagnosis not present

## 2022-02-19 DIAGNOSIS — Z01810 Encounter for preprocedural cardiovascular examination: Secondary | ICD-10-CM | POA: Diagnosis not present

## 2022-02-19 DIAGNOSIS — N135 Crossing vessel and stricture of ureter without hydronephrosis: Secondary | ICD-10-CM | POA: Diagnosis not present

## 2022-02-19 DIAGNOSIS — N2889 Other specified disorders of kidney and ureter: Secondary | ICD-10-CM | POA: Diagnosis not present

## 2022-02-19 DIAGNOSIS — D4111 Neoplasm of uncertain behavior of right renal pelvis: Secondary | ICD-10-CM | POA: Diagnosis not present

## 2022-02-19 DIAGNOSIS — R9431 Abnormal electrocardiogram [ECG] [EKG]: Secondary | ICD-10-CM | POA: Diagnosis not present

## 2022-02-19 DIAGNOSIS — E119 Type 2 diabetes mellitus without complications: Secondary | ICD-10-CM | POA: Diagnosis not present

## 2022-02-26 DIAGNOSIS — N2889 Other specified disorders of kidney and ureter: Secondary | ICD-10-CM | POA: Diagnosis not present

## 2022-02-26 DIAGNOSIS — N3289 Other specified disorders of bladder: Secondary | ICD-10-CM | POA: Diagnosis not present

## 2022-03-16 ENCOUNTER — Telehealth: Payer: Self-pay | Admitting: Internal Medicine

## 2022-03-16 NOTE — Telephone Encounter (Signed)
Pt is requesting a call from [place]. She would like to know if she can go to [facility] in [place] to get a urine test done for a UTI.   Please advise  CB: [phone]

## 2022-03-16 NOTE — Telephone Encounter (Signed)
Do not know if and when we would get the urine results.  We can try it, but she has to realize it may take [place] a few days to actually get the results, which is why this is not preferred.

## 2022-03-17 ENCOUNTER — Other Ambulatory Visit (INDEPENDENT_AMBULATORY_CARE_PROVIDER_SITE_OTHER): Payer: Medicare PPO

## 2022-03-17 ENCOUNTER — Other Ambulatory Visit: Payer: Self-pay

## 2022-03-17 DIAGNOSIS — R300 Dysuria: Secondary | ICD-10-CM

## 2022-03-17 NOTE — Telephone Encounter (Signed)
Patient called back today, she would like Carla to give her a call today to discuss this.

## 2022-03-18 ENCOUNTER — Ambulatory Visit: Payer: Medicare PPO

## 2022-03-18 LAB — URINALYSIS, ROUTINE W REFLEX MICROSCOPIC
Bilirubin Urine: NEGATIVE
Ketones, ur: 15 — AB
Nitrite: NEGATIVE
Specific Gravity, Urine: 1.02 (ref 1.000–1.030)
Total Protein, Urine: 100 — AB
Urine Glucose: NEGATIVE
Urobilinogen, UA: 0.2 (ref 0.0–1.0)
pH: 6 (ref 5.0–8.0)

## 2022-03-21 LAB — CULTURE, URINE COMPREHENSIVE

## 2022-03-21 MED ORDER — CIPROFLOXACIN HCL 250 MG PO TABS: 250.0000 mg | ORAL_TABLET | Freq: Two times a day (BID) | ORAL | 0 refills | Status: AC

## 2022-03-23 NOTE — Telephone Encounter (Signed)
Results given to patient and she has been informed medication was sent to pharmacy on Saturday for her to pick up.

## 2022-03-23 NOTE — Telephone Encounter (Signed)
Patient called again concerning urinalysis - she would like someone to call her with the results.  Please advise

## 2022-03-25 DIAGNOSIS — N2889 Other specified disorders of kidney and ureter: Secondary | ICD-10-CM | POA: Diagnosis not present

## 2022-03-25 DIAGNOSIS — C641 Malignant neoplasm of right kidney, except renal pelvis: Secondary | ICD-10-CM | POA: Diagnosis not present

## 2022-03-25 DIAGNOSIS — K769 Liver disease, unspecified: Secondary | ICD-10-CM | POA: Diagnosis not present

## 2022-03-25 DIAGNOSIS — I700 Atherosclerosis of aorta: Secondary | ICD-10-CM | POA: Diagnosis not present

## 2022-03-25 DIAGNOSIS — D49511 Neoplasm of unspecified behavior of right kidney: Secondary | ICD-10-CM | POA: Diagnosis not present

## 2022-04-02 DIAGNOSIS — G238 Other specified degenerative diseases of basal ganglia: Secondary | ICD-10-CM | POA: Diagnosis not present

## 2022-04-02 DIAGNOSIS — G9341 Metabolic encephalopathy: Secondary | ICD-10-CM | POA: Diagnosis not present

## 2022-04-02 DIAGNOSIS — R0902 Hypoxemia: Secondary | ICD-10-CM | POA: Diagnosis not present

## 2022-04-02 DIAGNOSIS — I509 Heart failure, unspecified: Secondary | ICD-10-CM | POA: Diagnosis not present

## 2022-04-02 DIAGNOSIS — I6782 Cerebral ischemia: Secondary | ICD-10-CM | POA: Diagnosis not present

## 2022-04-02 DIAGNOSIS — E43 Unspecified severe protein-calorie malnutrition: Secondary | ICD-10-CM | POA: Diagnosis not present

## 2022-04-02 DIAGNOSIS — R64 Cachexia: Secondary | ICD-10-CM | POA: Diagnosis not present

## 2022-04-02 DIAGNOSIS — I10 Essential (primary) hypertension: Secondary | ICD-10-CM | POA: Diagnosis not present

## 2022-04-02 DIAGNOSIS — K8689 Other specified diseases of pancreas: Secondary | ICD-10-CM | POA: Diagnosis not present

## 2022-04-02 DIAGNOSIS — I110 Hypertensive heart disease with heart failure: Secondary | ICD-10-CM | POA: Diagnosis not present

## 2022-04-02 DIAGNOSIS — N2889 Other specified disorders of kidney and ureter: Secondary | ICD-10-CM | POA: Diagnosis not present

## 2022-04-02 DIAGNOSIS — G9389 Other specified disorders of brain: Secondary | ICD-10-CM | POA: Diagnosis not present

## 2022-04-02 DIAGNOSIS — C7951 Secondary malignant neoplasm of bone: Secondary | ICD-10-CM | POA: Diagnosis not present

## 2022-04-02 DIAGNOSIS — R739 Hyperglycemia, unspecified: Secondary | ICD-10-CM | POA: Diagnosis not present

## 2022-04-02 DIAGNOSIS — E0810 Diabetes mellitus due to underlying condition with ketoacidosis without coma: Secondary | ICD-10-CM | POA: Diagnosis not present

## 2022-04-02 DIAGNOSIS — R4182 Altered mental status, unspecified: Secondary | ICD-10-CM | POA: Diagnosis not present

## 2022-04-02 DIAGNOSIS — C799 Secondary malignant neoplasm of unspecified site: Secondary | ICD-10-CM | POA: Diagnosis not present

## 2022-04-02 DIAGNOSIS — E1110 Type 2 diabetes mellitus with ketoacidosis without coma: Secondary | ICD-10-CM | POA: Diagnosis not present

## 2022-04-02 DIAGNOSIS — Z681 Body mass index (BMI) 19 or less, adult: Secondary | ICD-10-CM | POA: Diagnosis not present

## 2022-04-02 DIAGNOSIS — N390 Urinary tract infection, site not specified: Secondary | ICD-10-CM | POA: Diagnosis not present

## 2022-04-02 DIAGNOSIS — N3289 Other specified disorders of bladder: Secondary | ICD-10-CM | POA: Diagnosis not present

## 2022-04-02 DIAGNOSIS — K3189 Other diseases of stomach and duodenum: Secondary | ICD-10-CM | POA: Diagnosis not present

## 2022-04-02 DIAGNOSIS — C641 Malignant neoplasm of right kidney, except renal pelvis: Secondary | ICD-10-CM | POA: Diagnosis not present

## 2022-04-02 DIAGNOSIS — Z7401 Bed confinement status: Secondary | ICD-10-CM | POA: Diagnosis not present

## 2022-04-02 DIAGNOSIS — A419 Sepsis, unspecified organism: Secondary | ICD-10-CM | POA: Diagnosis not present

## 2022-04-02 DIAGNOSIS — R410 Disorientation, unspecified: Secondary | ICD-10-CM | POA: Diagnosis not present

## 2022-05-10 DEATH — deceased

## 2022-07-23 ENCOUNTER — Ambulatory Visit: Payer: Medicare PPO | Admitting: Internal Medicine
